# Patient Record
Sex: Male | Born: 1940
Health system: Southern US, Community
[De-identification: ages and names within clinical notes are randomized; demographics above are authoritative.]

## PROBLEM LIST (undated history)

## (undated) DIAGNOSIS — M1990 Unspecified osteoarthritis, unspecified site: Secondary | ICD-10-CM

## (undated) DIAGNOSIS — K8020 Calculus of gallbladder without cholecystitis without obstruction: Secondary | ICD-10-CM

## (undated) DIAGNOSIS — Z95810 Presence of automatic (implantable) cardiac defibrillator: Secondary | ICD-10-CM

## (undated) DIAGNOSIS — D696 Thrombocytopenia, unspecified: Secondary | ICD-10-CM

## (undated) DIAGNOSIS — N200 Calculus of kidney: Secondary | ICD-10-CM

## (undated) DIAGNOSIS — I4891 Unspecified atrial fibrillation: Secondary | ICD-10-CM

## (undated) DIAGNOSIS — D510 Vitamin B12 deficiency anemia due to intrinsic factor deficiency: Secondary | ICD-10-CM

## (undated) DIAGNOSIS — M549 Dorsalgia, unspecified: Secondary | ICD-10-CM

## (undated) DIAGNOSIS — E785 Hyperlipidemia, unspecified: Secondary | ICD-10-CM

## (undated) DIAGNOSIS — G8929 Other chronic pain: Secondary | ICD-10-CM

## (undated) DIAGNOSIS — D500 Iron deficiency anemia secondary to blood loss (chronic): Principal | ICD-10-CM

## (undated) DIAGNOSIS — L0390 Cellulitis, unspecified: Secondary | ICD-10-CM

## (undated) DIAGNOSIS — K909 Intestinal malabsorption, unspecified: Secondary | ICD-10-CM

## (undated) DIAGNOSIS — Z860100 Personal history of colon polyps, unspecified: Secondary | ICD-10-CM

## (undated) DIAGNOSIS — M4800 Spinal stenosis, site unspecified: Secondary | ICD-10-CM

## (undated) DIAGNOSIS — T4145XA Adverse effect of unspecified anesthetic, initial encounter: Secondary | ICD-10-CM

## (undated) DIAGNOSIS — I219 Acute myocardial infarction, unspecified: Secondary | ICD-10-CM

## (undated) DIAGNOSIS — C220 Liver cell carcinoma: Secondary | ICD-10-CM

## (undated) DIAGNOSIS — I10 Essential (primary) hypertension: Secondary | ICD-10-CM

## (undated) DIAGNOSIS — E1122 Type 2 diabetes mellitus with diabetic chronic kidney disease: Secondary | ICD-10-CM

## (undated) DIAGNOSIS — Z96659 Presence of unspecified artificial knee joint: Secondary | ICD-10-CM

## (undated) DIAGNOSIS — I255 Ischemic cardiomyopathy: Secondary | ICD-10-CM

## (undated) DIAGNOSIS — N183 Chronic kidney disease, stage 3 (moderate): Secondary | ICD-10-CM

## (undated) DIAGNOSIS — T8859XA Other complications of anesthesia, initial encounter: Secondary | ICD-10-CM

## (undated) DIAGNOSIS — G4733 Obstructive sleep apnea (adult) (pediatric): Secondary | ICD-10-CM

## (undated) DIAGNOSIS — G629 Polyneuropathy, unspecified: Secondary | ICD-10-CM

## (undated) HISTORY — DX: Intestinal malabsorption, unspecified: K90.9

## (undated) HISTORY — DX: Presence of unspecified artificial knee joint: Z96.659

## (undated) HISTORY — PX: LITHOTRIPSY: SUR834

## (undated) HISTORY — PX: OTHER SURGICAL HISTORY: SHX169

## (undated) HISTORY — DX: Unspecified atrial fibrillation: I48.91

## (undated) HISTORY — PX: TONSILLECTOMY: SUR1361

## (undated) HISTORY — PX: COLONOSCOPY: SHX174

## (undated) HISTORY — DX: Obstructive sleep apnea (adult) (pediatric): G47.33

## (undated) HISTORY — DX: Presence of automatic (implantable) cardiac defibrillator: Z95.810

## (undated) HISTORY — DX: Calculus of kidney: N20.0

## (undated) HISTORY — DX: Calculus of gallbladder without cholecystitis without obstruction: K80.20

## (undated) HISTORY — DX: Ischemic cardiomyopathy: I25.5

## (undated) HISTORY — DX: Type 2 diabetes mellitus with diabetic chronic kidney disease: E11.22

## (undated) HISTORY — DX: Essential (primary) hypertension: I10

## (undated) HISTORY — DX: Hyperlipidemia, unspecified: E78.5

## (undated) HISTORY — PX: LAMINECTOMY: SHX219

## (undated) HISTORY — PX: TOTAL KNEE ARTHROPLASTY: SHX125

## (undated) HISTORY — DX: Spinal stenosis, site unspecified: M48.00

## (undated) HISTORY — DX: Iron deficiency anemia secondary to blood loss (chronic): D50.0

## (undated) HISTORY — DX: Vitamin B12 deficiency anemia due to intrinsic factor deficiency: D51.0

## (undated) HISTORY — DX: Thrombocytopenia, unspecified: D69.6

## (undated) HISTORY — PX: ESOPHAGOGASTRODUODENOSCOPY: SHX1529

## (undated) HISTORY — DX: Chronic kidney disease, stage 3 (moderate): N18.3

---

## 1995-09-21 DIAGNOSIS — I219 Acute myocardial infarction, unspecified: Secondary | ICD-10-CM

## 1995-09-21 HISTORY — DX: Acute myocardial infarction, unspecified: I21.9

## 1995-09-21 HISTORY — PX: OTHER SURGICAL HISTORY: SHX169

## 2013-10-17 DIAGNOSIS — I2589 Other forms of chronic ischemic heart disease: Secondary | ICD-10-CM | POA: Diagnosis not present

## 2013-11-07 ENCOUNTER — Ambulatory Visit (INDEPENDENT_AMBULATORY_CARE_PROVIDER_SITE_OTHER): Payer: Medicare Other | Admitting: Cardiology

## 2013-11-07 ENCOUNTER — Encounter: Payer: Self-pay | Admitting: Cardiology

## 2013-11-07 VITALS — BP 150/70 | HR 84 | Ht 73.0 in | Wt 334.1 lb

## 2013-11-07 DIAGNOSIS — E785 Hyperlipidemia, unspecified: Secondary | ICD-10-CM

## 2013-11-07 DIAGNOSIS — I2510 Atherosclerotic heart disease of native coronary artery without angina pectoris: Secondary | ICD-10-CM

## 2013-11-07 DIAGNOSIS — I2589 Other forms of chronic ischemic heart disease: Secondary | ICD-10-CM

## 2013-11-07 DIAGNOSIS — I255 Ischemic cardiomyopathy: Secondary | ICD-10-CM | POA: Insufficient documentation

## 2013-11-07 DIAGNOSIS — Z95810 Presence of automatic (implantable) cardiac defibrillator: Secondary | ICD-10-CM

## 2013-11-07 DIAGNOSIS — I4891 Unspecified atrial fibrillation: Secondary | ICD-10-CM

## 2013-11-07 DIAGNOSIS — I10 Essential (primary) hypertension: Secondary | ICD-10-CM

## 2013-11-07 HISTORY — DX: Presence of automatic (implantable) cardiac defibrillator: Z95.810

## 2013-11-07 HISTORY — DX: Hyperlipidemia, unspecified: E78.5

## 2013-11-07 MED ORDER — OLMESARTAN MEDOXOMIL 40 MG PO TABS: 80.0000 mg | ORAL_TABLET | Freq: Every day | ORAL | Status: DC

## 2013-11-07 NOTE — Assessment & Plan Note (Signed)
Continue statin. 

## 2013-11-07 NOTE — Assessment & Plan Note (Signed)
There is a question history of atrial fibrillation. The patient is very diligent concerning his medical history and records. He states this may have been an error previously. I will obtain records from his previous cardiologist in [place] concerning this issue. If not documented I would favor discontinuing his anticoagulation particularly in light of history of thrombocytopenia. If atrial fibrillation has been documented he would need long-term anticoagulation as he has multiple embolic risk factors. I will discontinue aspirin and continue xeralto for now until we obtain records. Check hemoglobin and renal function.

## 2013-11-07 NOTE — Patient Instructions (Signed)
Your physician recommends that you schedule a follow-up appointment in: [place] TO 44 MG = 2 -40 MG TABLETS ONCE DAILY   Your physician recommends that you return for lab work in: [place]- DO NOT EAT PRIOR TO LAB WORK  APPOINTMENT TO [place].

## 2013-11-07 NOTE — Assessment & Plan Note (Signed)
Continue present medications. 

## 2013-11-07 NOTE — Progress Notes (Signed)
HPI: 73 year old male to establish for coronary artery disease. Patient is status post coronary artery bypassing graft in 1997. Patient had a LIMA to the LAD, saphenous vein graft to the diagonal, saphenous vein graft to the circumflex and sequential saphenous vein graft to the PDA and posterior lateral. His procedure was performed at [facility]. He subsequently moved to [facility]. Echocardiogram in 2005 showed an ejection fraction of 38%, restrictive filling and trace tricuspid regurgitation. Last nuclear study in August of 2013 showed a prior anterior infarct. There was a prior inferior and lateral infarct with mild peri-infarct ischemia. Ejection fraction 35%. Cardiac catheterization in March 2014 revealed occlusion of the LAD with a 70% ostial diagonal. The circumflex and right coronary artery were occluded. The saphenous vein graft to the circumflex was occluded. Saphenous vein graft to the diagonal occluded. The graft to the PDA and posterior lateral was patent. The LIMA to the LAD was patent. There were left to left collaterals filling marginal branches and diagonal branch. Ejection fraction 30-35%. Patient treated medically. Patient had ICD placed at that time. Note patient states his diagnosis of atrial fibrillation may have been in error. He does not recall ever being told of this until a late office visit with his last cardiologist. Patient moved back to this area 3 months ago. Patient has some dyspnea on exertion but no orthopnea, PND, palpitations, syncope or chest pain. Occasional mild pedal edema.  Current Outpatient Prescriptions  Medication Sig Dispense Refill  . amoxicillin (AMOXIL) 500 MG capsule Take 500 mg by mouth. PRIOR TO DENTAL WORK      . carvedilol (COREG) 12.5 MG tablet Take 12.5 mg by mouth 2 (two) times daily with a meal.      . Cholecalciferol (HM VITAMIN D3) 4000 UNITS CAPS Take 1 capsule by mouth daily.      . clobetasol (TEMOVATE) 0.05 % external solution Apply  1 application topically as needed.      . clobetasol ointment (TEMOVATE) 4.13 % Apply 1 application topically as needed.      . Coenzyme Q10 200 MG TABS Take 1 tablet by mouth daily.      Marland Kitchen ezetimibe (ZETIA) 10 MG tablet Take 10 mg by mouth daily.      . fenofibrate (TRICOR) 48 MG tablet Take 48 mg by mouth daily.      . furosemide (LASIX) 40 MG tablet Take 80 mg by mouth 2 (two) times daily.      . Insulin Regular Human (HUMULIN R U-500, CONCENTRATED, [place]) Inject 18 Units into the skin. 18 U @ BREAKFAST AND 6 TO 8 U @ DINNER      . metFORMIN (GLUCOPHAGE) 500 MG tablet Take 1,000 mg by mouth 2 (two) times daily with a meal.      . montelukast (SINGULAIR) 10 MG tablet Take 10 mg by mouth at bedtime.      . Multiple Vitamins-Minerals (CENTRUM SILVER PO) Take 1 tablet by mouth daily.      Marland Kitchen NIACINAMIDE-ZN-CU-METHYLFOLATE PO Take 2,000 mcg by mouth daily.      . nitroGLYCERIN (NITROSTAT) 0.4 MG SL tablet Place 0.4 mg under the tongue every 5 (five) minutes as needed for chest pain.      Marland Kitchen olmesartan (BENICAR) 40 MG tablet Take 40 mg by mouth daily.      Marland Kitchen omega-3 acid ethyl esters (LOVAZA) 1 G capsule Take 2 g by mouth daily.      . potassium chloride (K-DUR) 10 MEQ tablet  Take 20 mEq by mouth 2 (two) times daily.      . pravastatin (PRAVACHOL) 80 MG tablet Take 80 mg by mouth daily.      . Probiotic Product (PROBIOTIC DAILY PO) Take 1 tablet by mouth daily.      . Rivaroxaban (XARELTO) 20 MG TABS tablet Take 20 mg by mouth daily with supper.      Marland Kitchen spironolactone (ALDACTONE) 25 MG tablet Take 25 mg by mouth daily. 1/2 TABLET Monday, WED, AND FRI       No current facility-administered medications for this visit.    Allergies  Allergen Reactions  . Contrast Media [Iodinated Diagnostic Agents]   . Iodine      Past Medical History  Diagnosis Date  . CAD (coronary artery disease)   . Atrial fibrillation     Question history  . Hyperlipidemia   . Obstructive sleep apnea   . Nephrolithiasis    . Diabetes mellitus   . Congestive heart failure   . Thrombocytopenia   . Cholelithiasis   . Spinal stenosis   . ICD (implantable cardioverter-defibrillator) battery depletion   . Hypertension   . Ischemic cardiomyopathy     Past Surgical History  Procedure Laterality Date  . Coronary artery bypass and graft  1997  . Tonsillectomy    . Right rotator cuff surgery    . Total knee arthroplasty Left   . Laminectomy      History   Social History  . Marital Status: Married    Spouse Name: N/A    Number of Children: N/A  . Years of Education: N/A   Occupational History  .      Retired from [employer]  . Smoking status: Never Smoker   . Smokeless tobacco: Not on file  . Alcohol Use: Yes     Comment: Occasional  . Drug Use: Not on file  . Sexual Activity: Not on file   Other Topics Concern  . Not on file   Social History Narrative  . No narrative on file    Family History  Problem Relation Age of Onset  . Heart disease      No family history    ROS: no fevers or chills, productive cough, hemoptysis, dysphasia, odynophagia, melena, hematochezia, dysuria, hematuria, rash, seizure activity, orthopnea, PND, pedal edema, claudication. Remaining systems are negative.  Physical Exam:   Blood pressure 150/70, pulse 84, height 6' 1"  (1.854 m), weight 334 lb 1.9 oz (151.556 kg).  General:  Well developed/obese in NAD Skin warm/dry Patient not depressed No peripheral clubbing Back-normal HEENT-normal/normal eyelids Neck supple/normal carotid upstroke bilaterally; no bruits; no JVD; no thyromegaly chest - CTA/ normal expansion, ICD left chest CV - RRR/normal S1 and S2; no murmurs, rubs or gallops;  PMI nondisplaced Abdomen -obese, NT/ND, no HSM, no mass, + bowel sounds, no bruit 2+ femoral pulses, no bruits Ext-trace edema, no chords, 2+ DP Neuro-grossly nonfocal  ECG sinus rhythm at a rate of 84. First degree AV block. Right bundle  branch block and inferior lateral infarct.

## 2013-11-07 NOTE — Assessment & Plan Note (Signed)
Patient has not been tolerant of other statins. Continue Pravachol. Check lipids, liver and CK.

## 2013-11-07 NOTE — Assessment & Plan Note (Signed)
Referred to electrophysiology for long-term followup. Patient has a Sport and exercise psychologist. [name] device.

## 2013-11-07 NOTE — Assessment & Plan Note (Signed)
Continue carvedilol. Increase Benicar to 80 mg daily. Check potassium and renal function in one week.

## 2013-11-14 ENCOUNTER — Other Ambulatory Visit: Payer: Self-pay | Admitting: Cardiology

## 2013-11-14 DIAGNOSIS — I2510 Atherosclerotic heart disease of native coronary artery without angina pectoris: Secondary | ICD-10-CM | POA: Diagnosis not present

## 2013-11-15 LAB — BASIC METABOLIC PANEL WITH GFR
BUN: 25 mg/dL — AB (ref 6–23)
CHLORIDE: 102 meq/L (ref 96–112)
CO2: 25 mEq/L (ref 19–32)
Calcium: 9.2 mg/dL (ref 8.4–10.5)
Creat: 1.2 mg/dL (ref 0.50–1.35)
GFR, Est African American: 69 mL/min
GFR, Est Non African American: 60 mL/min
GLUCOSE: 150 mg/dL — AB (ref 70–99)
POTASSIUM: 4 meq/L (ref 3.5–5.3)
Sodium: 141 mEq/L (ref 135–145)

## 2013-11-15 LAB — LIPID PANEL
CHOL/HDL RATIO: 4.1 ratio
Cholesterol: 118 mg/dL (ref 0–200)
HDL: 29 mg/dL — AB (ref >39)
LDL CALC: 57 mg/dL (ref 0–99)
Triglycerides: 162 mg/dL — ABNORMAL HIGH (ref <150)
VLDL: 32 mg/dL (ref 0–40)

## 2013-11-15 LAB — HEPATIC FUNCTION PANEL
ALBUMIN: 4.4 g/dL (ref 3.5–5.2)
ALT: 44 U/L (ref 0–53)
AST: 40 U/L — ABNORMAL HIGH (ref 0–37)
Alkaline Phosphatase: 51 U/L (ref 39–117)
BILIRUBIN TOTAL: 0.5 mg/dL (ref 0.2–1.2)
Bilirubin, Direct: 0.2 mg/dL (ref 0.0–0.3)
Indirect Bilirubin: 0.3 mg/dL (ref 0.2–1.2)
Total Protein: 6.5 g/dL (ref 6.0–8.3)

## 2013-11-15 LAB — CBC
HCT: 37.4 % — ABNORMAL LOW (ref 39.0–52.0)
HEMOGLOBIN: 12.3 g/dL — AB (ref 13.0–17.0)
MCH: 29.4 pg (ref 26.0–34.0)
MCHC: 32.9 g/dL (ref 30.0–36.0)
MCV: 89.5 fL (ref 78.0–100.0)
Platelets: 87 10*3/uL — ABNORMAL LOW (ref 150–400)
RBC: 4.18 MIL/uL — AB (ref 4.22–5.81)
RDW: 16.5 % — ABNORMAL HIGH (ref 11.5–15.5)
WBC: 5 10*3/uL (ref 4.0–10.5)

## 2013-11-15 LAB — CK: Total CK: 201 U/L (ref 7–232)

## 2013-11-20 ENCOUNTER — Encounter: Payer: Self-pay | Admitting: Internal Medicine

## 2013-11-20 ENCOUNTER — Ambulatory Visit (INDEPENDENT_AMBULATORY_CARE_PROVIDER_SITE_OTHER): Payer: Medicare Other | Admitting: Internal Medicine

## 2013-11-20 ENCOUNTER — Institutional Professional Consult (permissible substitution): Payer: Medicare Other | Admitting: Internal Medicine

## 2013-11-20 VITALS — BP 136/84 | HR 90 | Ht 73.0 in | Wt 333.0 lb

## 2013-11-20 DIAGNOSIS — I4891 Unspecified atrial fibrillation: Secondary | ICD-10-CM

## 2013-11-20 DIAGNOSIS — I2589 Other forms of chronic ischemic heart disease: Secondary | ICD-10-CM

## 2013-11-20 DIAGNOSIS — Z95810 Presence of automatic (implantable) cardiac defibrillator: Secondary | ICD-10-CM

## 2013-11-20 DIAGNOSIS — I255 Ischemic cardiomyopathy: Secondary | ICD-10-CM

## 2013-11-20 DIAGNOSIS — I10 Essential (primary) hypertension: Secondary | ICD-10-CM | POA: Diagnosis not present

## 2013-11-20 LAB — MDC_IDC_ENUM_SESS_TYPE_INCLINIC
Battery Remaining Longevity: 92.4 mo
Brady Statistic RV Percent Paced: 0.04 %
Date Time Interrogation Session: 20150303113458
HighPow Impedance: 73 Ohm
Implantable Pulse Generator Model: 2411
Lead Channel Impedance Value: 425 Ohm
Lead Channel Pacing Threshold Amplitude: 0.5 V
Lead Channel Pacing Threshold Amplitude: 0.5 V
Lead Channel Pacing Threshold Pulse Width: 0.5 ms
Lead Channel Sensing Intrinsic Amplitude: 12 mV
Lead Channel Sensing Intrinsic Amplitude: 2.6 mV
Lead Channel Setting Pacing Amplitude: 2 V
Lead Channel Setting Pacing Pulse Width: 0.5 ms
Lead Channel Setting Sensing Sensitivity: 0.5 mV
MDC IDC MSMT LEADCHNL RV IMPEDANCE VALUE: 412.5 Ohm
MDC IDC MSMT LEADCHNL RV PACING THRESHOLD PULSEWIDTH: 0.5 ms
MDC IDC PG SERIAL: 7067730
MDC IDC STAT BRADY RA PERCENT PACED: 0 %
Zone Setting Detection Interval: 270 ms
Zone Setting Detection Interval: 325 ms

## 2013-11-20 NOTE — Assessment & Plan Note (Signed)
His ICD is working normally. (St. [name] DDD) and will be rechecked in several months.

## 2013-11-20 NOTE — Patient Instructions (Signed)
Your physician wants you to follow-up in: 12 months with Dr Knox Saliva will receive a reminder letter in the mail two months in advance. If you don't receive a letter, please call our office to schedule the follow-up appointment.  Remote monitoring is used to monitor your Pacemaker or ICD from home. This monitoring reduces the number of office visits required to check your device to one time per year. It allows [place] to keep an eye on the functioning of your device to ensure it is working properly. You are scheduled for a device check from home on 02/21/14. You may send your transmission at any time that day. If you have a wireless device, the transmission will be sent automatically. After your physician reviews your transmission, you will receive a postcard with your next transmission date.

## 2013-11-20 NOTE — Assessment & Plan Note (Signed)
His blood pressure is slightly elevated. I carefully discussed the importance of weight loss.

## 2013-11-20 NOTE — Assessment & Plan Note (Signed)
He denies anginal symptoms. I have asked the patient to increase his physical activity and lose weight.

## 2013-11-20 NOTE — Progress Notes (Signed)
HPI Robert Gregory is referred today for evaluation and management of his ICD. He is a pleasant 73 yo man with morbide obesity, who recently moved from [place] back to [place]. He has a h/o CAD and underwent CABG. He has a h/o atrial fibrillation. He has lost approximately 30 lbs in the past several months. He has chronic systolic heart failure with an EF of 35%. He has DM.  Allergies  Allergen Reactions  . Contrast Media [Iodinated Diagnostic Agents]   . Iodine      Current Outpatient Prescriptions  Medication Sig Dispense Refill  . carvedilol (COREG) 12.5 MG tablet Take 25 mg by mouth 2 (two) times daily with a meal.       . Cholecalciferol (HM VITAMIN D3) 4000 UNITS CAPS Take 1 capsule by mouth daily.      . clobetasol ointment (TEMOVATE) 3.33 % Apply 1 application topically as needed.      . Coenzyme Q10 200 MG TABS Take 1 tablet by mouth daily.      Marland Kitchen ezetimibe (ZETIA) 10 MG tablet Take 10 mg by mouth daily.      . fenofibrate (TRICOR) 48 MG tablet Take 48 mg by mouth daily.      . furosemide (LASIX) 40 MG tablet Take 80 mg by mouth 2 (two) times daily.      . Insulin Regular Human (HUMULIN R U-500, CONCENTRATED, [place]) Inject 18 Units into the skin. 18 U @ BREAKFAST AND 6 TO 8 U @ DINNER      . metFORMIN (GLUCOPHAGE) 500 MG tablet Take 1,000 mg by mouth 2 (two) times daily with a meal.      . montelukast (SINGULAIR) 10 MG tablet Take 10 mg by mouth at bedtime.      . Multiple Vitamins-Minerals (CENTRUM SILVER PO) Take 1 tablet by mouth daily.      Marland Kitchen NIACINAMIDE-ZN-CU-METHYLFOLATE PO Take 2,000 mcg by mouth daily.      . nitroGLYCERIN (NITROSTAT) 0.4 MG SL tablet Place 0.4 mg under the tongue every 5 (five) minutes as needed for chest pain.      Marland Kitchen olmesartan (BENICAR) 40 MG tablet Take 2 tablets (80 mg total) by mouth daily.  180 tablet  3  . omega-3 acid ethyl esters (LOVAZA) 1 G capsule Take 2 g by mouth daily.      . potassium chloride (K-DUR) 10 MEQ tablet Take 20 mEq  by mouth 2 (two) times daily.      . pravastatin (PRAVACHOL) 80 MG tablet Take 80 mg by mouth daily.      . Probiotic Product (PROBIOTIC DAILY PO) Take 1 tablet by mouth daily.      . Rivaroxaban (XARELTO) 20 MG TABS tablet Take 20 mg by mouth daily with supper.      Marland Kitchen spironolactone (ALDACTONE) 25 MG tablet Take 25 mg by mouth daily. 1/2 TABLET Monday, WED, AND FRI      . amoxicillin (AMOXIL) 500 MG capsule Take 500 mg by mouth. PRIOR TO DENTAL WORK       No current facility-administered medications for this visit.     Past Medical History  Diagnosis Date  . CAD (coronary artery disease)   . Atrial fibrillation     Question history  . Hyperlipidemia   . Obstructive sleep apnea   . Nephrolithiasis   . Diabetes mellitus   . Congestive heart failure   . Thrombocytopenia   . Cholelithiasis   . Spinal stenosis   .  ICD (implantable cardioverter-defibrillator) battery depletion   . Hypertension   . Ischemic cardiomyopathy     ROS:   All systems reviewed and negative except as noted in the HPI.   Past Surgical History  Procedure Laterality Date  . Coronary artery bypass and graft  1997  . Tonsillectomy    . Right rotator cuff surgery    . Total knee arthroplasty Left   . Laminectomy       Family History  Problem Relation Age of Onset  . Heart disease      No family history     History   Social History  . Marital Status: Married    Spouse Name: N/A    Number of Children: N/A  . Years of Education: N/A   Occupational History  .      Retired from [employer]  . Smoking status: Never Smoker   . Smokeless tobacco: Not on file  . Alcohol Use: Yes     Comment: Occasional  . Drug Use: Not on file  . Sexual Activity: Not on file   Other Topics Concern  . Not on file   Social History Narrative  . No narrative on file     BP 136/84  Pulse 90  Ht 6' 1"  (1.854 m)  Wt 333 lb (151.048 kg)  BMI 43.94 kg/m2  Physical  Exam:  stable appearing 73 yo obese man, NAD HEENT: Unremarkable Neck:  No JVD, no thyromegally Back:  No CVA tenderness Lungs:  Clear with no wheezes HEART:  Regular rate rhythm, no murmurs, no rubs, no clicks Abd:  soft, positive bowel sounds, no organomegally, no rebound, no guarding Ext:  2 plus pulses, no edema, no cyanosis, no clubbing Skin:  No rashes no nodules Neuro:  CN II through XII intact, motor grossly intact   DEVICE  Normal device function.  See PaceArt for details.   Assess/Plan:

## 2013-11-22 DIAGNOSIS — H938X9 Other specified disorders of ear, unspecified ear: Secondary | ICD-10-CM | POA: Diagnosis not present

## 2013-11-22 DIAGNOSIS — D693 Immune thrombocytopenic purpura: Secondary | ICD-10-CM | POA: Diagnosis not present

## 2013-11-22 DIAGNOSIS — I509 Heart failure, unspecified: Secondary | ICD-10-CM | POA: Diagnosis not present

## 2013-11-22 DIAGNOSIS — E7800 Pure hypercholesterolemia, unspecified: Secondary | ICD-10-CM | POA: Diagnosis not present

## 2013-11-22 DIAGNOSIS — I10 Essential (primary) hypertension: Secondary | ICD-10-CM | POA: Diagnosis not present

## 2013-11-22 DIAGNOSIS — N182 Chronic kidney disease, stage 2 (mild): Secondary | ICD-10-CM | POA: Diagnosis not present

## 2013-11-22 DIAGNOSIS — E1129 Type 2 diabetes mellitus with other diabetic kidney complication: Secondary | ICD-10-CM | POA: Diagnosis not present

## 2013-11-22 DIAGNOSIS — Z23 Encounter for immunization: Secondary | ICD-10-CM | POA: Diagnosis not present

## 2013-11-28 DIAGNOSIS — H2510 Age-related nuclear cataract, unspecified eye: Secondary | ICD-10-CM | POA: Diagnosis not present

## 2013-11-28 DIAGNOSIS — H47239 Glaucomatous optic atrophy, unspecified eye: Secondary | ICD-10-CM | POA: Diagnosis not present

## 2013-11-28 DIAGNOSIS — E119 Type 2 diabetes mellitus without complications: Secondary | ICD-10-CM | POA: Diagnosis not present

## 2013-12-10 DIAGNOSIS — H2510 Age-related nuclear cataract, unspecified eye: Secondary | ICD-10-CM | POA: Diagnosis not present

## 2013-12-10 DIAGNOSIS — E119 Type 2 diabetes mellitus without complications: Secondary | ICD-10-CM | POA: Diagnosis not present

## 2013-12-10 DIAGNOSIS — H47239 Glaucomatous optic atrophy, unspecified eye: Secondary | ICD-10-CM | POA: Diagnosis not present

## 2013-12-20 DIAGNOSIS — M7750 Other enthesopathy of unspecified foot: Secondary | ICD-10-CM | POA: Diagnosis not present

## 2013-12-20 DIAGNOSIS — M19279 Secondary osteoarthritis, unspecified ankle and foot: Secondary | ICD-10-CM | POA: Diagnosis not present

## 2013-12-20 DIAGNOSIS — L84 Corns and callosities: Secondary | ICD-10-CM | POA: Diagnosis not present

## 2013-12-20 DIAGNOSIS — R609 Edema, unspecified: Secondary | ICD-10-CM | POA: Diagnosis not present

## 2013-12-20 DIAGNOSIS — M2040 Other hammer toe(s) (acquired), unspecified foot: Secondary | ICD-10-CM | POA: Diagnosis not present

## 2014-01-03 DIAGNOSIS — E1129 Type 2 diabetes mellitus with other diabetic kidney complication: Secondary | ICD-10-CM | POA: Diagnosis not present

## 2014-01-03 DIAGNOSIS — I4891 Unspecified atrial fibrillation: Secondary | ICD-10-CM | POA: Diagnosis not present

## 2014-01-03 DIAGNOSIS — E669 Obesity, unspecified: Secondary | ICD-10-CM | POA: Diagnosis not present

## 2014-01-03 DIAGNOSIS — N182 Chronic kidney disease, stage 2 (mild): Secondary | ICD-10-CM | POA: Diagnosis not present

## 2014-01-17 DIAGNOSIS — R350 Frequency of micturition: Secondary | ICD-10-CM | POA: Diagnosis not present

## 2014-01-17 DIAGNOSIS — E1129 Type 2 diabetes mellitus with other diabetic kidney complication: Secondary | ICD-10-CM | POA: Diagnosis not present

## 2014-01-17 DIAGNOSIS — N3000 Acute cystitis without hematuria: Secondary | ICD-10-CM | POA: Diagnosis not present

## 2014-01-24 DIAGNOSIS — N182 Chronic kidney disease, stage 2 (mild): Secondary | ICD-10-CM | POA: Diagnosis not present

## 2014-01-24 DIAGNOSIS — E7800 Pure hypercholesterolemia, unspecified: Secondary | ICD-10-CM | POA: Diagnosis not present

## 2014-01-24 DIAGNOSIS — E1129 Type 2 diabetes mellitus with other diabetic kidney complication: Secondary | ICD-10-CM | POA: Diagnosis not present

## 2014-01-24 DIAGNOSIS — I509 Heart failure, unspecified: Secondary | ICD-10-CM | POA: Diagnosis not present

## 2014-01-24 DIAGNOSIS — N3000 Acute cystitis without hematuria: Secondary | ICD-10-CM | POA: Diagnosis not present

## 2014-01-24 DIAGNOSIS — I10 Essential (primary) hypertension: Secondary | ICD-10-CM | POA: Diagnosis not present

## 2014-01-24 DIAGNOSIS — R5383 Other fatigue: Secondary | ICD-10-CM | POA: Diagnosis not present

## 2014-01-24 DIAGNOSIS — R5381 Other malaise: Secondary | ICD-10-CM | POA: Diagnosis not present

## 2014-01-24 DIAGNOSIS — E559 Vitamin D deficiency, unspecified: Secondary | ICD-10-CM | POA: Diagnosis not present

## 2014-01-30 ENCOUNTER — Encounter: Payer: Self-pay | Admitting: Cardiology

## 2014-01-30 ENCOUNTER — Ambulatory Visit (INDEPENDENT_AMBULATORY_CARE_PROVIDER_SITE_OTHER): Payer: Medicare Other | Admitting: Cardiology

## 2014-01-30 VITALS — BP 138/82 | HR 74 | Ht 73.0 in | Wt 328.1 lb

## 2014-01-30 DIAGNOSIS — E785 Hyperlipidemia, unspecified: Secondary | ICD-10-CM | POA: Diagnosis not present

## 2014-01-30 DIAGNOSIS — Z95810 Presence of automatic (implantable) cardiac defibrillator: Secondary | ICD-10-CM

## 2014-01-30 DIAGNOSIS — I2510 Atherosclerotic heart disease of native coronary artery without angina pectoris: Secondary | ICD-10-CM

## 2014-01-30 MED ORDER — FUROSEMIDE 40 MG PO TABS: 80.0000 mg | ORAL_TABLET | Freq: Two times a day (BID) | ORAL | Status: DC

## 2014-01-30 MED ORDER — EZETIMIBE 10 MG PO TABS: 10.0000 mg | ORAL_TABLET | Freq: Every day | ORAL | Status: DC

## 2014-01-30 MED ORDER — RIVAROXABAN 20 MG PO TABS: 20.0000 mg | ORAL_TABLET | Freq: Every day | ORAL | Status: DC

## 2014-01-30 MED ORDER — PRAVASTATIN SODIUM 80 MG PO TABS: 80.0000 mg | ORAL_TABLET | Freq: Every day | ORAL | Status: DC

## 2014-01-30 MED ORDER — SPIRONOLACTONE 25 MG PO TABS: ORAL_TABLET | ORAL | Status: DC

## 2014-01-30 MED ORDER — FENOFIBRATE 48 MG PO TABS: 48.0000 mg | ORAL_TABLET | Freq: Every day | ORAL | Status: DC

## 2014-01-30 MED ORDER — OMEGA-3-ACID ETHYL ESTERS 1 G PO CAPS: 2.0000 g | ORAL_CAPSULE | Freq: Two times a day (BID) | ORAL | Status: DC

## 2014-01-30 MED ORDER — CARVEDILOL 12.5 MG PO TABS: 12.5000 mg | ORAL_TABLET | Freq: Two times a day (BID) | ORAL | Status: DC

## 2014-01-30 NOTE — Assessment & Plan Note (Signed)
Continue present medications. 

## 2014-01-30 NOTE — Patient Instructions (Signed)
Your physician wants you to follow-up in: 6 MONTHS WITH DR CRENSHAW You will receive a reminder letter in the mail two months in advance. If you don't receive a letter, please call our office to schedule the follow-up appointment.  

## 2014-01-30 NOTE — Assessment & Plan Note (Signed)
Continue ARB and beta blocker. 

## 2014-01-30 NOTE — Assessment & Plan Note (Signed)
Management per electrophysiology. 

## 2014-01-30 NOTE — Assessment & Plan Note (Signed)
Apparently documented on previous device interrogation. Continue beta blocker and xarelto.

## 2014-01-30 NOTE — Assessment & Plan Note (Signed)
Not on aspirin given need for anticoagulation. Continue statin.

## 2014-01-30 NOTE — Progress Notes (Signed)
HPI: FU coronary artery disease. Patient is status post coronary artery bypassing graft in 1997. Patient had a LIMA to the LAD, saphenous vein graft to the diagonal, saphenous vein graft to the circumflex and sequential saphenous vein graft to the PDA and posterior lateral. His procedure was performed at [facility]. He subsequently moved to [facility]. Echocardiogram in 2005 showed an ejection fraction of 38%, restrictive filling and trace tricuspid regurgitation. Cardiac catheterization in March 2014 revealed occlusion of the LAD with a 70% ostial diagonal. The circumflex and right coronary artery were occluded. The saphenous vein graft to the circumflex was occluded. Saphenous vein graft to the diagonal occluded. The graft to the PDA and posterior lateral was patent. The LIMA to the LAD was patent. There were left to left collaterals filling marginal branches and diagonal branch. Ejection fraction 30-35%. Patient treated medically. Patient had ICD placed at that time. Also with h/o atrial fibrillation. Since last seen, the patient has dyspnea with more extreme activities but not with routine activities. It is relieved with rest. It is not associated with chest pain. There is no orthopnea, PND or pedal edema. There is no syncope or palpitations. There is no exertional chest pain.    Current Outpatient Prescriptions  Medication Sig Dispense Refill  . Canagliflozin-Metformin HCl (INVOKAMET) 50-1000 MG TABS Take 1 tablet by mouth 2 (two) times daily.      . carvedilol (COREG) 12.5 MG tablet Take 12.5 mg by mouth 2 (two) times daily with a meal.       . Cholecalciferol (VITAMIN D PO) Take 5,000 Units by mouth daily.      . clobetasol ointment (TEMOVATE) 9.56 % Apply 1 application topically as needed.      . Coenzyme Q10 200 MG TABS Take 1 tablet by mouth daily.      Marland Kitchen ezetimibe (ZETIA) 10 MG tablet Take 10 mg by mouth daily.      . fenofibrate (TRICOR) 48 MG tablet Take 48 mg by mouth daily.       . furosemide (LASIX) 40 MG tablet Take 80 mg by mouth 2 (two) times daily.      . Insulin Regular Human (HUMULIN R U-500, CONCENTRATED, [place]) Inject 18 Units into the skin. 18 U @ BREAKFAST AND 6 TO 8 U @ DINNER      . montelukast (SINGULAIR) 10 MG tablet Take 10 mg by mouth at bedtime.      . Multiple Vitamins-Minerals (CENTRUM SILVER PO) Take 1 tablet by mouth daily.      Marland Kitchen NIACINAMIDE-ZN-CU-METHYLFOLATE PO Take 2,000 mcg by mouth daily.      . nitroGLYCERIN (NITROSTAT) 0.4 MG SL tablet Place 0.4 mg under the tongue every 5 (five) minutes as needed for chest pain.      Marland Kitchen olmesartan (BENICAR) 40 MG tablet Take 2 tablets (80 mg total) by mouth daily.  180 tablet  3  . omega-3 acid ethyl esters (LOVAZA) 1 G capsule Take 2 g by mouth 2 (two) times daily.       . potassium chloride (K-DUR) 10 MEQ tablet Take 20 mEq by mouth 2 (two) times daily.      . pravastatin (PRAVACHOL) 80 MG tablet Take 80 mg by mouth daily.      . Probiotic Product (PROBIOTIC DAILY PO) Take 1 tablet by mouth daily.      . Rivaroxaban (XARELTO) 20 MG TABS tablet Take 20 mg by mouth daily with supper.      Marland Kitchen  spironolactone (ALDACTONE) 25 MG tablet 1/2 TABLET Monday, WED, AND FRI      . amoxicillin (AMOXIL) 500 MG capsule Take 500 mg by mouth. PRIOR TO DENTAL WORK       No current facility-administered medications for this visit.     Past Medical History  Diagnosis Date  . CAD (coronary artery disease)   . Atrial fibrillation     Question history  . Hyperlipidemia   . Obstructive sleep apnea   . Nephrolithiasis   . Diabetes mellitus   . Congestive heart failure   . Thrombocytopenia   . Cholelithiasis   . Spinal stenosis   . ICD (implantable cardioverter-defibrillator) battery depletion   . Hypertension   . Ischemic cardiomyopathy     Past Surgical History  Procedure Laterality Date  . Coronary artery bypass and graft  1997  . Tonsillectomy    . Right rotator cuff surgery    . Total knee arthroplasty  Left   . Laminectomy      History   Social History  . Marital Status: Married    Spouse Name: N/A    Number of Children: N/A  . Years of Education: N/A   Occupational History  .      Retired from [employer]  . Smoking status: Never Smoker   . Smokeless tobacco: Not on file  . Alcohol Use: Yes     Comment: Occasional  . Drug Use: Not on file  . Sexual Activity: Not on file   Other Topics Concern  . Not on file   Social History Narrative  . No narrative on file    ROS: no fevers or chills, productive cough, hemoptysis, dysphasia, odynophagia, melena, hematochezia, dysuria, hematuria, rash, seizure activity, orthopnea, PND, pedal edema, claudication. Remaining systems are negative.  Physical Exam: Well-developed obese in no acute distress.  Skin is warm and dry.  HEENT is normal.  Neck is supple.  Chest is clear to auscultation with normal expansion.  Cardiovascular exam is regular rate and rhythm.  Abdominal exam nontender or distended. No masses palpated. Extremities show no edema. neuro grossly intact  ECG Sinus rhythm, first degree AV block, right bundle branch block, prior inferior infarct.

## 2014-01-30 NOTE — Assessment & Plan Note (Signed)
Blood pressure controlled. Continue present medications. 

## 2014-02-21 ENCOUNTER — Ambulatory Visit (INDEPENDENT_AMBULATORY_CARE_PROVIDER_SITE_OTHER): Payer: Medicare Other | Admitting: *Deleted

## 2014-02-21 DIAGNOSIS — I2589 Other forms of chronic ischemic heart disease: Secondary | ICD-10-CM | POA: Diagnosis not present

## 2014-02-21 DIAGNOSIS — I4891 Unspecified atrial fibrillation: Secondary | ICD-10-CM | POA: Diagnosis not present

## 2014-02-21 DIAGNOSIS — I255 Ischemic cardiomyopathy: Secondary | ICD-10-CM

## 2014-02-21 LAB — MDC_IDC_ENUM_SESS_TYPE_REMOTE
Battery Remaining Longevity: 92 mo
Battery Remaining Percentage: 87 %
Battery Voltage: 3.01 V
Brady Statistic RV Percent Paced: 1 %
Date Time Interrogation Session: 20150604060020
HighPow Impedance: 71 Ohm
HighPow Impedance: 71 Ohm
Implantable Pulse Generator Serial Number: 7067730
Lead Channel Impedance Value: 410 Ohm
Lead Channel Impedance Value: 410 Ohm
Lead Channel Pacing Threshold Amplitude: 0.5 V
Lead Channel Sensing Intrinsic Amplitude: 12 mV
Lead Channel Sensing Intrinsic Amplitude: 2.6 mV
Lead Channel Setting Pacing Amplitude: 2 V
Lead Channel Setting Pacing Pulse Width: 0.5 ms
Lead Channel Setting Sensing Sensitivity: 0.5 mV
MDC IDC MSMT LEADCHNL RV PACING THRESHOLD PULSEWIDTH: 0.5 ms
MDC IDC PG MODEL: 2411
Zone Setting Detection Interval: 270 ms
Zone Setting Detection Interval: 325 ms

## 2014-02-22 ENCOUNTER — Encounter: Payer: Self-pay | Admitting: Internal Medicine

## 2014-02-22 NOTE — Progress Notes (Signed)
Remote ICD transmission.   

## 2014-03-01 ENCOUNTER — Encounter: Payer: Self-pay | Admitting: Cardiology

## 2014-03-05 ENCOUNTER — Encounter: Payer: Self-pay | Admitting: Internal Medicine

## 2014-03-25 DIAGNOSIS — Z79899 Other long term (current) drug therapy: Secondary | ICD-10-CM | POA: Diagnosis not present

## 2014-03-25 DIAGNOSIS — E7800 Pure hypercholesterolemia, unspecified: Secondary | ICD-10-CM | POA: Diagnosis not present

## 2014-03-25 DIAGNOSIS — E1129 Type 2 diabetes mellitus with other diabetic kidney complication: Secondary | ICD-10-CM | POA: Diagnosis not present

## 2014-03-25 DIAGNOSIS — N182 Chronic kidney disease, stage 2 (mild): Secondary | ICD-10-CM | POA: Diagnosis not present

## 2014-04-03 DIAGNOSIS — E7800 Pure hypercholesterolemia, unspecified: Secondary | ICD-10-CM | POA: Diagnosis not present

## 2014-04-03 DIAGNOSIS — I10 Essential (primary) hypertension: Secondary | ICD-10-CM | POA: Diagnosis not present

## 2014-04-03 DIAGNOSIS — D693 Immune thrombocytopenic purpura: Secondary | ICD-10-CM | POA: Diagnosis not present

## 2014-05-28 ENCOUNTER — Emergency Department (HOSPITAL_BASED_OUTPATIENT_CLINIC_OR_DEPARTMENT_OTHER): Payer: Medicare Other

## 2014-05-28 ENCOUNTER — Emergency Department (HOSPITAL_BASED_OUTPATIENT_CLINIC_OR_DEPARTMENT_OTHER)
Admission: EM | Admit: 2014-05-28 | Discharge: 2014-05-28 | Disposition: A | Discharge status: Home / Self Care | Payer: Medicare Other | Attending: Emergency Medicine | Admitting: Emergency Medicine

## 2014-05-28 ENCOUNTER — Encounter (HOSPITAL_BASED_OUTPATIENT_CLINIC_OR_DEPARTMENT_OTHER): Payer: Self-pay | Admitting: Emergency Medicine

## 2014-05-28 ENCOUNTER — Ambulatory Visit (INDEPENDENT_AMBULATORY_CARE_PROVIDER_SITE_OTHER): Payer: Medicare Other | Admitting: *Deleted

## 2014-05-28 DIAGNOSIS — Z79899 Other long term (current) drug therapy: Secondary | ICD-10-CM | POA: Diagnosis not present

## 2014-05-28 DIAGNOSIS — Z951 Presence of aortocoronary bypass graft: Secondary | ICD-10-CM | POA: Insufficient documentation

## 2014-05-28 DIAGNOSIS — G4733 Obstructive sleep apnea (adult) (pediatric): Secondary | ICD-10-CM | POA: Insufficient documentation

## 2014-05-28 DIAGNOSIS — Z87442 Personal history of urinary calculi: Secondary | ICD-10-CM | POA: Insufficient documentation

## 2014-05-28 DIAGNOSIS — I2589 Other forms of chronic ischemic heart disease: Secondary | ICD-10-CM | POA: Diagnosis not present

## 2014-05-28 DIAGNOSIS — Z95810 Presence of automatic (implantable) cardiac defibrillator: Secondary | ICD-10-CM | POA: Diagnosis not present

## 2014-05-28 DIAGNOSIS — M1711 Unilateral primary osteoarthritis, right knee: Secondary | ICD-10-CM

## 2014-05-28 DIAGNOSIS — Z792 Long term (current) use of antibiotics: Secondary | ICD-10-CM | POA: Diagnosis not present

## 2014-05-28 DIAGNOSIS — Z96659 Presence of unspecified artificial knee joint: Secondary | ICD-10-CM | POA: Insufficient documentation

## 2014-05-28 DIAGNOSIS — I10 Essential (primary) hypertension: Secondary | ICD-10-CM | POA: Insufficient documentation

## 2014-05-28 DIAGNOSIS — E785 Hyperlipidemia, unspecified: Secondary | ICD-10-CM | POA: Diagnosis not present

## 2014-05-28 DIAGNOSIS — IMO0002 Reserved for concepts with insufficient information to code with codable children: Secondary | ICD-10-CM | POA: Insufficient documentation

## 2014-05-28 DIAGNOSIS — Z8719 Personal history of other diseases of the digestive system: Secondary | ICD-10-CM | POA: Insufficient documentation

## 2014-05-28 DIAGNOSIS — I509 Heart failure, unspecified: Secondary | ICD-10-CM

## 2014-05-28 DIAGNOSIS — M25569 Pain in unspecified knee: Secondary | ICD-10-CM | POA: Insufficient documentation

## 2014-05-28 DIAGNOSIS — M1710 Unilateral primary osteoarthritis, unspecified knee: Secondary | ICD-10-CM | POA: Diagnosis not present

## 2014-05-28 DIAGNOSIS — I2510 Atherosclerotic heart disease of native coronary artery without angina pectoris: Secondary | ICD-10-CM | POA: Diagnosis not present

## 2014-05-28 DIAGNOSIS — Z7901 Long term (current) use of anticoagulants: Secondary | ICD-10-CM | POA: Insufficient documentation

## 2014-05-28 DIAGNOSIS — I255 Ischemic cardiomyopathy: Secondary | ICD-10-CM

## 2014-05-28 DIAGNOSIS — Z794 Long term (current) use of insulin: Secondary | ICD-10-CM | POA: Insufficient documentation

## 2014-05-28 DIAGNOSIS — I4891 Unspecified atrial fibrillation: Secondary | ICD-10-CM | POA: Insufficient documentation

## 2014-05-28 DIAGNOSIS — Z862 Personal history of diseases of the blood and blood-forming organs and certain disorders involving the immune mechanism: Secondary | ICD-10-CM | POA: Insufficient documentation

## 2014-05-28 DIAGNOSIS — E119 Type 2 diabetes mellitus without complications: Secondary | ICD-10-CM | POA: Insufficient documentation

## 2014-05-28 LAB — MDC_IDC_ENUM_SESS_TYPE_REMOTE
Battery Remaining Longevity: 90 mo
Battery Remaining Percentage: 84 %
Battery Voltage: 3.01 V
HIGH POWER IMPEDANCE MEASURED VALUE: 78 Ohm
HIGH POWER IMPEDANCE MEASURED VALUE: 78 Ohm
Lead Channel Impedance Value: 450 Ohm
Lead Channel Pacing Threshold Amplitude: 0.5 V
Lead Channel Sensing Intrinsic Amplitude: 3 mV
Lead Channel Setting Pacing Amplitude: 2 V
Lead Channel Setting Sensing Sensitivity: 0.5 mV
MDC IDC MSMT LEADCHNL RV IMPEDANCE VALUE: 430 Ohm
MDC IDC MSMT LEADCHNL RV PACING THRESHOLD PULSEWIDTH: 0.5 ms
MDC IDC MSMT LEADCHNL RV SENSING INTR AMPL: 12 mV
MDC IDC PG MODEL: 2411
MDC IDC PG SERIAL: 7067730
MDC IDC SESS DTM: 20150908060027
MDC IDC SET LEADCHNL RV PACING PULSEWIDTH: 0.5 ms
MDC IDC SET ZONE DETECTION INTERVAL: 270 ms
MDC IDC STAT BRADY RV PERCENT PACED: 1 %
Zone Setting Detection Interval: 325 ms

## 2014-05-28 MED ORDER — OXYCODONE-ACETAMINOPHEN 5-325 MG PO TABS: 1.0000 | ORAL_TABLET | ORAL | Status: DC | PRN

## 2014-05-28 NOTE — ED Notes (Signed)
Right knee pain x7 days without known injury. Hx of knee replacement on left 9 years ago.

## 2014-05-28 NOTE — Discharge Instructions (Signed)
Osteoarthritis Osteoarthritis is a disease that causes soreness and inflammation of a joint. It occurs when the cartilage at the affected joint wears down. Cartilage acts as a cushion, covering the ends of bones where they meet to form a joint. Osteoarthritis is the most common form of arthritis. It often occurs in older people. The joints affected most often by this condition include those in the:  Ends of the fingers.  Thumbs.  Neck.  Lower back.  Knees.  Hips. CAUSES  Over time, the cartilage that covers the ends of bones begins to wear away. This causes bone to rub on bone, producing pain and stiffness in the affected joints.  RISK FACTORS Certain factors can increase your chances of having osteoarthritis, including:  Older age.  Excessive body weight.  Overuse of joints.  Previous joint injury. SIGNS AND SYMPTOMS   Pain, swelling, and stiffness in the joint.  Over time, the joint may lose its normal shape.  Small deposits of bone (osteophytes) may grow on the edges of the joint.  Bits of bone or cartilage can break off and float inside the joint space. This may cause more pain and damage. DIAGNOSIS  Your health care provider will do a physical exam and ask about your symptoms. Various tests may be ordered, such as:  X-rays of the affected joint.  An MRI scan.  Blood tests to rule out other types of arthritis.  Joint fluid tests. This involves using a needle to draw fluid from the joint and examining the fluid under a microscope. TREATMENT  Goals of treatment are to control pain and improve joint function. Treatment plans may include:  A prescribed exercise program that allows for rest and joint relief.  A weight control plan.  Pain relief techniques, such as:  Properly applied heat and cold.  Electric pulses delivered to nerve endings under the skin (transcutaneous electrical nerve stimulation [TENS]).  Massage.  Certain nutritional  supplements.  Medicines to control pain, such as:  Acetaminophen.  Nonsteroidal anti-inflammatory drugs (NSAIDs), such as naproxen.  Narcotic or central-acting agents, such as tramadol.  Corticosteroids. These can be given orally or as an injection.  Surgery to reposition the bones and relieve pain (osteotomy) or to remove loose pieces of bone and cartilage. Joint replacement may be needed in advanced states of osteoarthritis. HOME CARE INSTRUCTIONS   Take medicines only as directed by your health care provider.  Maintain a healthy weight. Follow your health care provider's instructions for weight control. This may include dietary instructions.  Exercise as directed. Your health care provider can recommend specific types of exercise. These may include:  Strengthening exercises. These are done to strengthen the muscles that support joints affected by arthritis. They can be performed with weights or with exercise bands to add resistance.  Aerobic activities. These are exercises, such as brisk walking or low-impact aerobics, that get your heart pumping.  Range-of-motion activities. These keep your joints limber.  Balance and agility exercises. These help you maintain daily living skills.  Rest your affected joints as directed by your health care provider.  Keep all follow-up visits as directed by your health care provider. SEEK MEDICAL CARE IF:   Your skin turns red.  You develop a rash in addition to your joint pain.  You have worsening joint pain.  You have a fever along with joint or muscle aches. SEEK IMMEDIATE MEDICAL CARE IF:  You have a significant loss of weight or appetite.  You have night sweats. FOR MORE  [place] [employer] Arthritis and Musculoskeletal and Skin Diseases: www.niams.SouthExposed.es  [employer] on Aging: http://kim-miller.com/  American College of Rheumatology: www.rheumatology.org Document Released: 09/06/2005 Document Revised:  01/21/2014 Document Reviewed: 05/14/2013 [facility] Patient Information 2015 [place], [place]. This information is not intended to replace advice given to you by your health care provider. Make sure you discuss any questions you have with your health care provider.

## 2014-05-28 NOTE — Progress Notes (Signed)
Remote ICD transmission.   

## 2014-05-28 NOTE — ED Provider Notes (Signed)
CSN: [id number]     Arrival date & time 05/28/14  1505 History   First MD Initiated Contact with Patient 05/28/14 1527     Chief Complaint  Patient presents with  . Knee Pain     (Consider location/radiation/quality/duration/timing/severity/associated sxs/prior Treatment) Patient is a 73 y.o. male presenting with knee pain.  Knee Pain Location:  Knee Time since incident:  1 week Injury: no   Knee location:  R knee Pain details:    Quality:  Aching   Radiates to:  Does not radiate   Severity:  Severe   Onset quality:  Gradual   Duration:  1 week   Timing:  Constant   Progression:  Worsening Chronicity:  Recurrent Prior injury to area:  No Relieved by:  Nothing Worsened by:  Activity and bearing weight Associated symptoms: swelling   Associated symptoms: no back pain, no fever, no neck pain, no numbness and no stiffness     Past Medical History  Diagnosis Date  . CAD (coronary artery disease)   . Atrial fibrillation     Question history  . Hyperlipidemia   . Obstructive sleep apnea   . Nephrolithiasis   . Diabetes mellitus   . Congestive heart failure   . Thrombocytopenia   . Cholelithiasis   . Spinal stenosis   . ICD (implantable cardioverter-defibrillator) battery depletion   . Hypertension   . Ischemic cardiomyopathy    Past Surgical History  Procedure Laterality Date  . Coronary artery bypass and graft  1997  . Tonsillectomy    . Right rotator cuff surgery    . Total knee arthroplasty Left   . Laminectomy     Family History  Problem Relation Age of Onset  . Heart disease      No family history   History  Substance Use Topics  . Smoking status: Never Smoker   . Smokeless tobacco: Not on file  . Alcohol Use: Yes     Comment: Occasional    Review of Systems  Constitutional: Negative for fever and chills.  HENT: Negative for congestion, rhinorrhea and sore throat.   Eyes: Negative for photophobia and visual disturbance.  Respiratory: Negative for  cough and shortness of breath.   Cardiovascular: Negative for chest pain and leg swelling.  Gastrointestinal: Negative for nausea, vomiting, abdominal pain, diarrhea and constipation.  Endocrine: Negative for polydipsia and polyuria.  Genitourinary: Negative for dysuria and hematuria.  Musculoskeletal: Negative for arthralgias, back pain, neck pain and stiffness.  Skin: Negative for color change and rash.  Neurological: Negative for dizziness, syncope, light-headedness and headaches.  Hematological: Negative for adenopathy. Does not bruise/bleed easily.  All other systems reviewed and are negative.     Allergies  Contrast media and Iodine  Home Medications   Prior to Admission medications   Medication Sig Start Date End Date Taking? Authorizing Provider  amoxicillin (AMOXIL) 500 MG capsule Take 500 mg by mouth. PRIOR TO DENTAL WORK    Historical Provider, MD  Canagliflozin-Metformin HCl (INVOKAMET) 50-1000 MG TABS Take 1 tablet by mouth 2 (two) times daily.    Historical Provider, MD  carvedilol (COREG) 12.5 MG tablet Take 1 tablet (12.5 mg total) by mouth 2 (two) times daily with a meal. 01/30/14   Lelon Perla, MD  Cholecalciferol (VITAMIN D PO) Take 5,000 Units by mouth daily.    Historical Provider, MD  clobetasol ointment (TEMOVATE) 7.67 % Apply 1 application topically as needed.    Historical Provider, MD  Coenzyme Q10 200  MG TABS Take 1 tablet by mouth daily.    Historical Provider, MD  ezetimibe (ZETIA) 10 MG tablet Take 1 tablet (10 mg total) by mouth daily. 01/30/14   Lelon Perla, MD  fenofibrate (TRICOR) 48 MG tablet Take 1 tablet (48 mg total) by mouth daily. 01/30/14   Lelon Perla, MD  furosemide (LASIX) 40 MG tablet Take 2 tablets (80 mg total) by mouth 2 (two) times daily. 01/30/14   Lelon Perla, MD  Insulin Regular Human (HUMULIN R U-500, CONCENTRATED, [place]) Inject 18 Units into the skin. 18 U @ BREAKFAST AND 6 TO 8 U @ DINNER    Historical Provider, MD   montelukast (SINGULAIR) 10 MG tablet Take 10 mg by mouth at bedtime.    Historical Provider, MD  Multiple Vitamins-Minerals (CENTRUM SILVER PO) Take 1 tablet by mouth daily.    Historical Provider, MD  NIACINAMIDE-ZN-CU-METHYLFOLATE PO Take 2,000 mcg by mouth daily.    Historical Provider, MD  nitroGLYCERIN (NITROSTAT) 0.4 MG SL tablet Place 0.4 mg under the tongue every 5 (five) minutes as needed for chest pain.    Historical Provider, MD  olmesartan (BENICAR) 40 MG tablet Take 2 tablets (80 mg total) by mouth daily. 11/07/13   Lelon Perla, MD  omega-3 acid ethyl esters (LOVAZA) 1 G capsule Take 2 capsules (2 g total) by mouth 2 (two) times daily. 01/30/14   Lelon Perla, MD  oxyCODONE-acetaminophen (PERCOCET/ROXICET) 5-325 MG per tablet Take 1 tablet by mouth every 4 (four) hours as needed for severe pain. 05/28/14   Debby Freiberg, MD  potassium chloride (K-DUR) 10 MEQ tablet Take 20 mEq by mouth 2 (two) times daily.    Historical Provider, MD  pravastatin (PRAVACHOL) 80 MG tablet Take 1 tablet (80 mg total) by mouth daily. 01/30/14   Lelon Perla, MD  Probiotic Product (PROBIOTIC DAILY PO) Take 1 tablet by mouth daily.    Historical Provider, MD  rivaroxaban (XARELTO) 20 MG TABS tablet Take 1 tablet (20 mg total) by mouth daily with supper. 01/30/14   Lelon Perla, MD  spironolactone (ALDACTONE) 25 MG tablet 1/2 TABLET Monday, WED, AND FRI 01/30/14   Lelon Perla, MD   BP 149/67  Pulse 78  Temp(Src) 98.1 F (36.7 C) (Oral)  Resp 16  Ht 6' 1"  (1.854 m)  Wt 324 lb (146.965 kg)  BMI 42.76 kg/m2  SpO2 95% Physical Exam  Vitals reviewed. Constitutional: He is oriented to person, place, and time. He appears well-developed and well-nourished.  HENT:  Head: Normocephalic and atraumatic.  Eyes: Conjunctivae and EOM are normal.  Neck: Normal range of motion. Neck supple.  Cardiovascular: Normal rate, regular rhythm and normal heart sounds.   Pulmonary/Chest: Effort normal and  breath sounds normal. No respiratory distress.  Abdominal: He exhibits no distension. There is no tenderness. There is no rebound and no guarding.  Musculoskeletal: Normal range of motion.       Right knee: He exhibits swelling and effusion. He exhibits normal range of motion, no deformity, no LCL laxity, no bony tenderness and no MCL laxity. No tenderness found.  Neurological: He is alert and oriented to person, place, and time.  Skin: Skin is warm and dry.    ED Course  Procedures (including critical care time) Labs Review Labs Reviewed - No data to display  Imaging Review Dg Knee 1-2 Views Right  05/28/2014   CLINICAL DATA:  RIGHT knee pain.  No injury.  EXAM: RIGHT KNEE -  1-2 VIEW  COMPARISON:  None.  FINDINGS: Mild medial and lateral compartment osteoarthritis. The alignment of the knee is anatomic. Small knee effusion. Severe patellofemoral osteoarthritis is present with marginal osteophytes and subchondral cysts in the patella and trochlea. Vascular clips are present along medial RIGHT leg. Heterotopic bone is present along the superior lateral aspect of the patella.  IMPRESSION: Tricompartmental osteoarthritis, severe in the medial compartment.   Electronically Signed   By: Dereck Ligas M.D.   On: 05/28/2014 15:31     EKG Interpretation None      MDM   Final diagnoses:  Primary osteoarthritis of right knee    73 y.o. male  with pertinent PMH of CAD, DM presents with atraumatic R knee pain.  Pt began to notice symptoms after walking an increased amount over the weekend. He denies acute trauma. He has no history of similar symptoms. No systemic symptoms. On arrival vital signs and physical exam as above. No erythema, limited range of motion, or other concerning findings for septic arthritis or other emergent pathology. X-ray obtained and consistent with severe osteoarthritis.  Patient given standard return precautions, voiced understanding, agreed to followup.  Labs and imaging  as above reviewed.   1. Primary osteoarthritis of right knee         Debby Freiberg, MD 05/28/14 1620

## 2014-05-30 DIAGNOSIS — L570 Actinic keratosis: Secondary | ICD-10-CM | POA: Diagnosis not present

## 2014-05-30 DIAGNOSIS — D1800 Hemangioma unspecified site: Secondary | ICD-10-CM | POA: Diagnosis not present

## 2014-05-30 DIAGNOSIS — L821 Other seborrheic keratosis: Secondary | ICD-10-CM | POA: Diagnosis not present

## 2014-05-30 DIAGNOSIS — L259 Unspecified contact dermatitis, unspecified cause: Secondary | ICD-10-CM | POA: Diagnosis not present

## 2014-05-30 DIAGNOSIS — L408 Other psoriasis: Secondary | ICD-10-CM | POA: Diagnosis not present

## 2014-05-30 DIAGNOSIS — L80 Vitiligo: Secondary | ICD-10-CM | POA: Diagnosis not present

## 2014-06-03 DIAGNOSIS — IMO0002 Reserved for concepts with insufficient information to code with codable children: Secondary | ICD-10-CM | POA: Diagnosis not present

## 2014-06-03 DIAGNOSIS — M25569 Pain in unspecified knee: Secondary | ICD-10-CM | POA: Diagnosis not present

## 2014-06-05 ENCOUNTER — Other Ambulatory Visit: Payer: Self-pay | Admitting: Orthopedic Surgery

## 2014-06-05 DIAGNOSIS — M545 Low back pain, unspecified: Secondary | ICD-10-CM | POA: Diagnosis not present

## 2014-06-05 DIAGNOSIS — M5416 Radiculopathy, lumbar region: Secondary | ICD-10-CM

## 2014-06-05 DIAGNOSIS — M25559 Pain in unspecified hip: Secondary | ICD-10-CM | POA: Diagnosis not present

## 2014-06-09 ENCOUNTER — Encounter: Payer: Self-pay | Admitting: Internal Medicine

## 2014-06-10 ENCOUNTER — Ambulatory Visit
Admission: RE | Admit: 2014-06-10 | Discharge: 2014-06-10 | Disposition: A | Discharge status: Home / Self Care | Payer: Medicare Other | Source: Ambulatory Visit | Attending: Orthopedic Surgery | Admitting: Orthopedic Surgery

## 2014-06-10 DIAGNOSIS — M5416 Radiculopathy, lumbar region: Secondary | ICD-10-CM

## 2014-06-10 DIAGNOSIS — M47817 Spondylosis without myelopathy or radiculopathy, lumbosacral region: Secondary | ICD-10-CM | POA: Diagnosis not present

## 2014-06-10 DIAGNOSIS — M545 Low back pain, unspecified: Secondary | ICD-10-CM

## 2014-06-10 DIAGNOSIS — M48061 Spinal stenosis, lumbar region without neurogenic claudication: Secondary | ICD-10-CM | POA: Diagnosis not present

## 2014-06-10 DIAGNOSIS — M5137 Other intervertebral disc degeneration, lumbosacral region: Secondary | ICD-10-CM | POA: Diagnosis not present

## 2014-06-11 ENCOUNTER — Encounter: Payer: Self-pay | Admitting: Cardiology

## 2014-06-11 DIAGNOSIS — IMO0002 Reserved for concepts with insufficient information to code with codable children: Secondary | ICD-10-CM | POA: Diagnosis not present

## 2014-06-11 DIAGNOSIS — M47817 Spondylosis without myelopathy or radiculopathy, lumbosacral region: Secondary | ICD-10-CM | POA: Diagnosis not present

## 2014-06-11 DIAGNOSIS — M545 Low back pain, unspecified: Secondary | ICD-10-CM | POA: Diagnosis not present

## 2014-06-12 ENCOUNTER — Encounter: Payer: Self-pay | Admitting: Internal Medicine

## 2014-06-18 ENCOUNTER — Ambulatory Visit: Payer: Medicare Other | Attending: Physical Medicine and Rehabilitation | Admitting: Physical Therapy

## 2014-06-18 DIAGNOSIS — M545 Low back pain, unspecified: Secondary | ICD-10-CM | POA: Insufficient documentation

## 2014-06-18 DIAGNOSIS — M25569 Pain in unspecified knee: Secondary | ICD-10-CM | POA: Diagnosis not present

## 2014-06-18 DIAGNOSIS — IMO0001 Reserved for inherently not codable concepts without codable children: Secondary | ICD-10-CM | POA: Insufficient documentation

## 2014-06-20 DIAGNOSIS — S83241D Other tear of medial meniscus, current injury, right knee, subsequent encounter: Secondary | ICD-10-CM | POA: Diagnosis not present

## 2014-06-20 DIAGNOSIS — M7121 Synovial cyst of popliteal space [Baker], right knee: Secondary | ICD-10-CM | POA: Diagnosis not present

## 2014-06-23 ENCOUNTER — Encounter: Payer: Self-pay | Admitting: Internal Medicine

## 2014-06-24 ENCOUNTER — Other Ambulatory Visit: Payer: Self-pay | Admitting: Orthopedic Surgery

## 2014-06-24 DIAGNOSIS — M7121 Synovial cyst of popliteal space [Baker], right knee: Secondary | ICD-10-CM

## 2014-06-25 ENCOUNTER — Ambulatory Visit: Payer: Medicare Other | Attending: Physical Medicine and Rehabilitation | Admitting: Physical Therapy

## 2014-06-25 DIAGNOSIS — M25561 Pain in right knee: Secondary | ICD-10-CM | POA: Insufficient documentation

## 2014-06-25 DIAGNOSIS — M545 Low back pain: Secondary | ICD-10-CM | POA: Insufficient documentation

## 2014-06-25 DIAGNOSIS — Z5189 Encounter for other specified aftercare: Secondary | ICD-10-CM | POA: Diagnosis not present

## 2014-06-26 ENCOUNTER — Ambulatory Visit: Payer: Medicare Other | Admitting: Physical Therapy

## 2014-06-26 ENCOUNTER — Ambulatory Visit
Admission: RE | Admit: 2014-06-26 | Discharge: 2014-06-26 | Disposition: A | Discharge status: Home / Self Care | Payer: Medicare Other | Source: Ambulatory Visit | Attending: Orthopedic Surgery | Admitting: Orthopedic Surgery

## 2014-06-26 DIAGNOSIS — M7121 Synovial cyst of popliteal space [Baker], right knee: Secondary | ICD-10-CM

## 2014-06-27 ENCOUNTER — Other Ambulatory Visit: Payer: Medicare Other

## 2014-07-01 ENCOUNTER — Ambulatory Visit: Payer: Medicare Other | Admitting: Physical Therapy

## 2014-07-01 DIAGNOSIS — Z5189 Encounter for other specified aftercare: Secondary | ICD-10-CM | POA: Diagnosis not present

## 2014-07-03 ENCOUNTER — Ambulatory Visit: Payer: Medicare Other | Admitting: Physical Therapy

## 2014-07-03 DIAGNOSIS — Z5189 Encounter for other specified aftercare: Secondary | ICD-10-CM | POA: Diagnosis not present

## 2014-07-05 ENCOUNTER — Other Ambulatory Visit: Payer: Self-pay

## 2014-07-08 ENCOUNTER — Ambulatory Visit: Payer: Medicare Other | Admitting: Physical Therapy

## 2014-07-09 ENCOUNTER — Ambulatory Visit: Payer: Medicare Other

## 2014-07-11 ENCOUNTER — Ambulatory Visit: Payer: Medicare Other

## 2014-07-11 DIAGNOSIS — Z5189 Encounter for other specified aftercare: Secondary | ICD-10-CM | POA: Diagnosis not present

## 2014-07-11 DIAGNOSIS — E119 Type 2 diabetes mellitus without complications: Secondary | ICD-10-CM | POA: Diagnosis not present

## 2014-07-11 DIAGNOSIS — M2040 Other hammer toe(s) (acquired), unspecified foot: Secondary | ICD-10-CM | POA: Diagnosis not present

## 2014-07-11 DIAGNOSIS — L84 Corns and callosities: Secondary | ICD-10-CM | POA: Diagnosis not present

## 2014-07-11 DIAGNOSIS — Z23 Encounter for immunization: Secondary | ICD-10-CM | POA: Diagnosis not present

## 2014-07-15 ENCOUNTER — Encounter: Payer: Self-pay | Admitting: Internal Medicine

## 2014-07-16 ENCOUNTER — Ambulatory Visit: Payer: Medicare Other

## 2014-07-16 DIAGNOSIS — Z5189 Encounter for other specified aftercare: Secondary | ICD-10-CM | POA: Diagnosis not present

## 2014-07-18 ENCOUNTER — Ambulatory Visit: Payer: Medicare Other

## 2014-07-18 DIAGNOSIS — Z5189 Encounter for other specified aftercare: Secondary | ICD-10-CM | POA: Diagnosis not present

## 2014-07-22 ENCOUNTER — Ambulatory Visit: Payer: Medicare Other

## 2014-07-23 ENCOUNTER — Encounter: Payer: Self-pay | Admitting: Internal Medicine

## 2014-07-25 ENCOUNTER — Ambulatory Visit: Payer: Medicare Other | Attending: Physical Medicine and Rehabilitation

## 2014-07-25 DIAGNOSIS — Z5189 Encounter for other specified aftercare: Secondary | ICD-10-CM | POA: Diagnosis not present

## 2014-07-25 DIAGNOSIS — M25561 Pain in right knee: Secondary | ICD-10-CM | POA: Insufficient documentation

## 2014-07-25 DIAGNOSIS — M545 Low back pain: Secondary | ICD-10-CM | POA: Diagnosis not present

## 2014-07-26 NOTE — Telephone Encounter (Signed)
Pt was alerted by his Merlin while sleeping. There have been no device alerts or episodes. Merlin is updated on active transmissions. We believe the culprit may have been power flickering or temporarily going out. This could have caused his Merlin to reactivate w/ noise once power was back on.

## 2014-07-26 NOTE — Telephone Encounter (Signed)
Left my direct # w/ spouse for pt to call me back.

## 2014-07-30 ENCOUNTER — Ambulatory Visit: Payer: Medicare Other

## 2014-07-30 NOTE — Progress Notes (Signed)
HPI: FU coronary artery disease. Patient is status post coronary artery bypassing graft in 1997. Patient had a LIMA to the LAD, saphenous vein graft to the diagonal, saphenous vein graft to the circumflex and sequential saphenous vein graft to the PDA and posterior lateral. His procedure was performed at [facility]. He subsequently moved to [facility]. Echocardiogram in 2005 showed an ejection fraction of 38%, restrictive filling and trace tricuspid regurgitation. Cardiac catheterization in March 2014 revealed occlusion of the LAD with a 70% ostial diagonal. The circumflex and right coronary artery were occluded. The saphenous vein graft to the circumflex was occluded. Saphenous vein graft to the diagonal occluded. The graft to the PDA and posterior lateral was patent. The LIMA to the LAD was patent. There were left to left collaterals filling marginal branches and diagonal branch. Ejection fraction 30-35%. Patient treated medically. Patient had ICD placed at that time. Also with h/o atrial fibrillation. Since last seen, the patient has dyspnea with more extreme activities but not with routine activities. It is relieved with rest. It is not associated with chest pain. There is no orthopnea, PND or pedal edema. There is no syncope or palpitations. There is no exertional chest pain.   Current Outpatient Prescriptions  Medication Sig Dispense Refill  . amoxicillin (AMOXIL) 500 MG capsule Take 500 mg by mouth. PRIOR TO DENTAL WORK    . Canagliflozin-Metformin HCl (INVOKAMET) 50-1000 MG TABS Take 1 tablet by mouth 2 (two) times daily.    . carvedilol (COREG) 12.5 MG tablet Take 1 tablet (12.5 mg total) by mouth 2 (two) times daily with a meal. 180 tablet 3  . Cholecalciferol (VITAMIN D PO) Take 5,000 Units by mouth daily.    . clobetasol ointment (TEMOVATE) 5.39 % Apply 1 application topically as needed.    . Coenzyme Q10 200 MG TABS Take 1 tablet by mouth daily.    Marland Kitchen ezetimibe (ZETIA) 10 MG  tablet Take 1 tablet (10 mg total) by mouth daily. 90 tablet 3  . fenofibrate (TRICOR) 48 MG tablet Take 1 tablet (48 mg total) by mouth daily. 90 tablet 3  . furosemide (LASIX) 40 MG tablet Take 2 tablets (80 mg total) by mouth 2 (two) times daily. 360 tablet 3  . Insulin Regular Human (HUMULIN R U-500, CONCENTRATED, [place]) Inject 18 Units into the skin. 18 U @ BREAKFAST AND 6 TO 8 U @ DINNER    . Methylfol-Algae-B12-Acetylcyst 6-90.314-2-600 MG TABS Take 2,000 mg by mouth.    . montelukast (SINGULAIR) 10 MG tablet Take 10 mg by mouth at bedtime.    . Multiple Vitamins-Minerals (CENTRUM SILVER PO) Take 1 tablet by mouth daily.    . nitroGLYCERIN (NITROSTAT) 0.4 MG SL tablet Place 0.4 mg under the tongue every 5 (five) minutes as needed for chest pain.    Marland Kitchen olmesartan (BENICAR) 40 MG tablet Take 2 tablets (80 mg total) by mouth daily. 180 tablet 3  . omega-3 acid ethyl esters (LOVAZA) 1 G capsule Take 2 capsules (2 g total) by mouth 2 (two) times daily. 360 capsule 3  . potassium chloride (K-DUR) 10 MEQ tablet Take 20 mEq by mouth 2 (two) times daily.    . pravastatin (PRAVACHOL) 80 MG tablet Take 1 tablet (80 mg total) by mouth daily. 90 tablet 3  . Probiotic Product (PROBIOTIC DAILY PO) Take 1 tablet by mouth daily.    . rivaroxaban (XARELTO) 20 MG TABS tablet Take 1 tablet (20 mg total) by mouth daily  with supper. 90 tablet 3  . spironolactone (ALDACTONE) 25 MG tablet 1/2 TABLET Monday, WED, AND FRI 90 tablet 3  . Cinnamon 500 MG TABS Take by mouth.    . Cranberry 200 MG CAPS Take 300 mg by mouth.     No current facility-administered medications for this visit.     Past Medical History  Diagnosis Date  . CAD (coronary artery disease)   . Atrial fibrillation     Question history  . Hyperlipidemia   . Obstructive sleep apnea   . Nephrolithiasis   . Diabetes mellitus   . Congestive heart failure   . Thrombocytopenia   . Cholelithiasis   . Spinal stenosis   . ICD (implantable  cardioverter-defibrillator) battery depletion   . Hypertension   . Ischemic cardiomyopathy     Past Surgical History  Procedure Laterality Date  . Coronary artery bypass and graft  1997  . Tonsillectomy    . Right rotator cuff surgery    . Total knee arthroplasty Left   . Laminectomy      History   Social History  . Marital Status: Married    Spouse Name: N/A    Number of Children: N/A  . Years of Education: N/A   Occupational History  .      Retired from [employer]  . Smoking status: Never Smoker   . Smokeless tobacco: Not on file  . Alcohol Use: Yes     Comment: Occasional  . Drug Use: No  . Sexual Activity: Not on file   Other Topics Concern  . Not on file   Social History Narrative    ROS: Back pain but no fevers or chills, productive cough, hemoptysis, dysphasia, odynophagia, melena, hematochezia, dysuria, hematuria, rash, seizure activity, orthopnea, PND, pedal edema, claudication. Remaining systems are negative.  Physical Exam: Well-developed obese in no acute distress.  Skin is warm and dry.  HEENT is normal.  Neck is supple.  Chest is clear to auscultation with normal expansion.  Cardiovascular exam is regular rate and rhythm.  Abdominal exam nontender or distended. No masses palpated. Extremities show trace edema. neuro grossly intact  ECG Sinus rhythm with first-degree AV block, right bundle branch block, prior inferior infarct.

## 2014-07-31 ENCOUNTER — Encounter: Payer: Self-pay | Admitting: Cardiology

## 2014-07-31 ENCOUNTER — Ambulatory Visit: Payer: Medicare Other

## 2014-07-31 ENCOUNTER — Ambulatory Visit (INDEPENDENT_AMBULATORY_CARE_PROVIDER_SITE_OTHER): Payer: Medicare Other | Admitting: Cardiology

## 2014-07-31 VITALS — BP 122/66 | HR 72 | Ht 73.0 in | Wt 331.0 lb

## 2014-07-31 DIAGNOSIS — I10 Essential (primary) hypertension: Secondary | ICD-10-CM | POA: Diagnosis not present

## 2014-07-31 DIAGNOSIS — M545 Low back pain: Secondary | ICD-10-CM | POA: Diagnosis not present

## 2014-07-31 DIAGNOSIS — I2510 Atherosclerotic heart disease of native coronary artery without angina pectoris: Secondary | ICD-10-CM | POA: Diagnosis not present

## 2014-07-31 DIAGNOSIS — I255 Ischemic cardiomyopathy: Secondary | ICD-10-CM | POA: Diagnosis not present

## 2014-07-31 DIAGNOSIS — M25561 Pain in right knee: Secondary | ICD-10-CM | POA: Diagnosis not present

## 2014-07-31 DIAGNOSIS — E785 Hyperlipidemia, unspecified: Secondary | ICD-10-CM

## 2014-07-31 DIAGNOSIS — I2589 Other forms of chronic ischemic heart disease: Secondary | ICD-10-CM | POA: Diagnosis not present

## 2014-07-31 DIAGNOSIS — I480 Paroxysmal atrial fibrillation: Secondary | ICD-10-CM

## 2014-07-31 DIAGNOSIS — I4891 Unspecified atrial fibrillation: Secondary | ICD-10-CM | POA: Diagnosis not present

## 2014-07-31 DIAGNOSIS — I2583 Coronary atherosclerosis due to lipid rich plaque: Secondary | ICD-10-CM | POA: Diagnosis not present

## 2014-07-31 DIAGNOSIS — Z5189 Encounter for other specified aftercare: Secondary | ICD-10-CM | POA: Diagnosis not present

## 2014-07-31 DIAGNOSIS — Z95810 Presence of automatic (implantable) cardiac defibrillator: Secondary | ICD-10-CM

## 2014-07-31 MED ORDER — FENOFIBRATE 48 MG PO TABS: 48.0000 mg | ORAL_TABLET | Freq: Every day | ORAL | Status: DC

## 2014-07-31 MED ORDER — FUROSEMIDE 40 MG PO TABS: 80.0000 mg | ORAL_TABLET | Freq: Two times a day (BID) | ORAL | Status: DC

## 2014-07-31 MED ORDER — OMEGA-3-ACID ETHYL ESTERS 1 G PO CAPS: 2.0000 g | ORAL_CAPSULE | Freq: Two times a day (BID) | ORAL | Status: DC

## 2014-07-31 MED ORDER — OLMESARTAN MEDOXOMIL 40 MG PO TABS: 80.0000 mg | ORAL_TABLET | Freq: Every day | ORAL | Status: DC

## 2014-07-31 MED ORDER — SPIRONOLACTONE 25 MG PO TABS: ORAL_TABLET | ORAL | Status: DC

## 2014-07-31 MED ORDER — RIVAROXABAN 20 MG PO TABS: 20.0000 mg | ORAL_TABLET | Freq: Every day | ORAL | Status: DC

## 2014-07-31 MED ORDER — EZETIMIBE 10 MG PO TABS: 10.0000 mg | ORAL_TABLET | Freq: Every day | ORAL | Status: DC

## 2014-07-31 MED ORDER — POTASSIUM CHLORIDE ER 10 MEQ PO TBCR: 20.0000 meq | EXTENDED_RELEASE_TABLET | Freq: Two times a day (BID) | ORAL | Status: DC

## 2014-07-31 MED ORDER — PRAVASTATIN SODIUM 80 MG PO TABS: 80.0000 mg | ORAL_TABLET | Freq: Every day | ORAL | Status: DC

## 2014-07-31 MED ORDER — CARVEDILOL 12.5 MG PO TABS: 12.5000 mg | ORAL_TABLET | Freq: Two times a day (BID) | ORAL | Status: DC

## 2014-07-31 NOTE — Assessment & Plan Note (Signed)
Continue statin. Check lipids and liver. 

## 2014-07-31 NOTE — Assessment & Plan Note (Addendum)
Continue beta blocker and ARB. Continue present dose of diuretics. Euvolemic on examination.

## 2014-07-31 NOTE — Patient Instructions (Signed)
Your physician wants you to follow-up in: 6 MONTHS WITH DR CRENSHAW You will receive a reminder letter in the mail two months in advance. If you don't receive a letter, please call our office to schedule the follow-up appointment.   Your physician recommends that you HAVE LAB WORK TODAY 

## 2014-07-31 NOTE — Assessment & Plan Note (Signed)
Blood pressure controlled. Continue present medications. Check potassium and renal function. 

## 2014-07-31 NOTE — Assessment & Plan Note (Signed)
Followed by electrophysiology. 

## 2014-07-31 NOTE — Assessment & Plan Note (Signed)
Continue statin.Not on aspirin and the need for anticoagulation.

## 2014-07-31 NOTE — Assessment & Plan Note (Signed)
Patient remains in sinus rhythm. Continue beta blocker and xarelto. Check hemoglobin and renal function.

## 2014-08-01 ENCOUNTER — Ambulatory Visit: Payer: Medicare Other

## 2014-08-01 DIAGNOSIS — D696 Thrombocytopenia, unspecified: Secondary | ICD-10-CM | POA: Diagnosis not present

## 2014-08-01 DIAGNOSIS — M47816 Spondylosis without myelopathy or radiculopathy, lumbar region: Secondary | ICD-10-CM | POA: Diagnosis not present

## 2014-08-01 DIAGNOSIS — I2510 Atherosclerotic heart disease of native coronary artery without angina pectoris: Secondary | ICD-10-CM | POA: Diagnosis not present

## 2014-08-01 DIAGNOSIS — E1165 Type 2 diabetes mellitus with hyperglycemia: Secondary | ICD-10-CM | POA: Diagnosis not present

## 2014-08-01 DIAGNOSIS — E559 Vitamin D deficiency, unspecified: Secondary | ICD-10-CM | POA: Diagnosis not present

## 2014-08-01 DIAGNOSIS — M545 Low back pain: Secondary | ICD-10-CM | POA: Diagnosis not present

## 2014-08-01 LAB — BASIC METABOLIC PANEL WITH GFR
BUN: 27 mg/dL — AB (ref 6–23)
CO2: 25 mEq/L (ref 19–32)
Calcium: 9.6 mg/dL (ref 8.4–10.5)
Chloride: 101 mEq/L (ref 96–112)
Creat: 1.26 mg/dL (ref 0.50–1.35)
GFR, EST AFRICAN AMERICAN: 65 mL/min
GFR, EST NON AFRICAN AMERICAN: 56 mL/min — AB
Glucose, Bld: 230 mg/dL — ABNORMAL HIGH (ref 70–99)
POTASSIUM: 3.9 meq/L (ref 3.5–5.3)
Sodium: 139 mEq/L (ref 135–145)

## 2014-08-01 LAB — LIPID PANEL
CHOLESTEROL: 123 mg/dL (ref 0–200)
HDL: 30 mg/dL — AB (ref >39)
LDL Cholesterol: 46 mg/dL (ref 0–99)
Total CHOL/HDL Ratio: 4.1 Ratio
Triglycerides: 235 mg/dL — ABNORMAL HIGH (ref <150)
VLDL: 47 mg/dL — ABNORMAL HIGH (ref 0–40)

## 2014-08-01 LAB — CBC
HEMATOCRIT: 39.7 % (ref 39.0–52.0)
Hemoglobin: 13.3 g/dL (ref 13.0–17.0)
MCH: 30 pg (ref 26.0–34.0)
MCHC: 33.5 g/dL (ref 30.0–36.0)
MCV: 89.4 fL (ref 78.0–100.0)
Platelets: 91 10*3/uL — ABNORMAL LOW (ref 150–400)
RBC: 4.44 MIL/uL (ref 4.22–5.81)
RDW: 16.2 % — AB (ref 11.5–15.5)
WBC: 5.8 10*3/uL (ref 4.0–10.5)

## 2014-08-01 LAB — HEPATIC FUNCTION PANEL
ALK PHOS: 47 U/L (ref 39–117)
ALT: 40 U/L (ref 0–53)
AST: 36 U/L (ref 0–37)
Albumin: 4.2 g/dL (ref 3.5–5.2)
BILIRUBIN DIRECT: 0.1 mg/dL (ref 0.0–0.3)
Indirect Bilirubin: 0.4 mg/dL (ref 0.2–1.2)
Total Bilirubin: 0.5 mg/dL (ref 0.2–1.2)
Total Protein: 6.7 g/dL (ref 6.0–8.3)

## 2014-08-20 ENCOUNTER — Ambulatory Visit
Admission: RE | Admit: 2014-08-20 | Discharge: 2014-08-20 | Disposition: A | Discharge status: Home / Self Care | Payer: Medicare Other | Source: Ambulatory Visit | Attending: Orthopedic Surgery | Admitting: Orthopedic Surgery

## 2014-08-20 ENCOUNTER — Other Ambulatory Visit: Payer: Self-pay | Admitting: Orthopedic Surgery

## 2014-08-20 DIAGNOSIS — M1711 Unilateral primary osteoarthritis, right knee: Secondary | ICD-10-CM | POA: Diagnosis not present

## 2014-08-20 DIAGNOSIS — M7121 Synovial cyst of popliteal space [Baker], right knee: Secondary | ICD-10-CM | POA: Diagnosis not present

## 2014-08-20 DIAGNOSIS — M25561 Pain in right knee: Secondary | ICD-10-CM

## 2014-08-20 DIAGNOSIS — M25461 Effusion, right knee: Secondary | ICD-10-CM | POA: Diagnosis not present

## 2014-08-22 DIAGNOSIS — S83241D Other tear of medial meniscus, current injury, right knee, subsequent encounter: Secondary | ICD-10-CM | POA: Diagnosis not present

## 2014-08-22 DIAGNOSIS — M47816 Spondylosis without myelopathy or radiculopathy, lumbar region: Secondary | ICD-10-CM | POA: Diagnosis not present

## 2014-08-25 ENCOUNTER — Encounter: Payer: Self-pay | Admitting: Cardiology

## 2014-08-27 ENCOUNTER — Telehealth: Payer: Self-pay | Admitting: Cardiology

## 2014-08-27 DIAGNOSIS — I480 Paroxysmal atrial fibrillation: Secondary | ICD-10-CM

## 2014-08-27 DIAGNOSIS — I2510 Atherosclerotic heart disease of native coronary artery without angina pectoris: Secondary | ICD-10-CM

## 2014-08-27 DIAGNOSIS — I2583 Coronary atherosclerosis due to lipid rich plaque: Secondary | ICD-10-CM

## 2014-08-27 DIAGNOSIS — I255 Ischemic cardiomyopathy: Secondary | ICD-10-CM

## 2014-08-27 DIAGNOSIS — I10 Essential (primary) hypertension: Secondary | ICD-10-CM

## 2014-08-27 DIAGNOSIS — E785 Hyperlipidemia, unspecified: Secondary | ICD-10-CM

## 2014-08-27 DIAGNOSIS — Z95810 Presence of automatic (implantable) cardiac defibrillator: Secondary | ICD-10-CM

## 2014-08-27 MED ORDER — FUROSEMIDE 40 MG PO TABS: 80.0000 mg | ORAL_TABLET | Freq: Two times a day (BID) | ORAL | Status: DC

## 2014-08-27 MED ORDER — SPIRONOLACTONE 25 MG PO TABS: 25.0000 mg | ORAL_TABLET | Freq: Every day | ORAL | Status: DC

## 2014-08-27 NOTE — Telephone Encounter (Signed)
Spoke with pt, he voiced and repeated medication changes. Lab will be drawn at the [place] location in one week. Patient voiced understanding of restrictions and the need to let me know if his symptoms do not improve.

## 2014-08-27 NOTE — Telephone Encounter (Signed)
Take lasix 120 BID for 3 days and then resume 80 BID; change spironolactone to 25 mg daily; bmet one week; low NA diet; fluid restrict to 1.5 liters daily; if does not improve, PAOV Kirk Ruths

## 2014-08-27 NOTE — Telephone Encounter (Signed)
Spoke with pt, he reports being up 15 lbs in the last 3 to 4 days. He has edema in his feet. bp 147/70. SOB off and on yesterday, not always related to exertion. Woke during the night Sunday night with trouble breathing, slept in the recliner last night. His furosemide is 80 mg bid and spironolactone 12.5 mg m,w and f. He will go ahead and take the 80 mg dose this am. Will forward for dr Stanford Breed review

## 2014-08-29 ENCOUNTER — Telehealth: Payer: Self-pay | Admitting: *Deleted

## 2014-08-29 ENCOUNTER — Encounter: Payer: Self-pay | Admitting: *Deleted

## 2014-08-29 ENCOUNTER — Encounter: Payer: Self-pay | Admitting: Internal Medicine

## 2014-08-29 ENCOUNTER — Ambulatory Visit (INDEPENDENT_AMBULATORY_CARE_PROVIDER_SITE_OTHER): Payer: Medicare Other | Admitting: *Deleted

## 2014-08-29 ENCOUNTER — Encounter: Payer: Self-pay | Admitting: Cardiology

## 2014-08-29 DIAGNOSIS — I255 Ischemic cardiomyopathy: Secondary | ICD-10-CM

## 2014-08-29 LAB — MDC_IDC_ENUM_SESS_TYPE_REMOTE
Battery Remaining Longevity: 88 mo
Battery Remaining Percentage: 83 %
Brady Statistic RV Percent Paced: 1 %
Date Time Interrogation Session: 20151210070018
HIGH POWER IMPEDANCE MEASURED VALUE: 79 Ohm
HighPow Impedance: 79 Ohm
Implantable Pulse Generator Model: 2411
Lead Channel Impedance Value: 430 Ohm
Lead Channel Setting Sensing Sensitivity: 0.5 mV
MDC IDC MSMT BATTERY VOLTAGE: 2.99 V
MDC IDC MSMT LEADCHNL RA IMPEDANCE VALUE: 430 Ohm
MDC IDC MSMT LEADCHNL RV SENSING INTR AMPL: 12 mV
MDC IDC PG SERIAL: 7067730
MDC IDC SET LEADCHNL RV PACING AMPLITUDE: 2 V
MDC IDC SET LEADCHNL RV PACING PULSEWIDTH: 0.5 ms
Zone Setting Detection Interval: 270 ms
Zone Setting Detection Interval: 325 ms

## 2014-08-29 NOTE — Telephone Encounter (Signed)
Clearance for right knee scope and clearance to hold xarelto 2 days prior to procedure faxed to the number provided.

## 2014-08-29 NOTE — Progress Notes (Signed)
Remote ICD transmission.   

## 2014-09-03 DIAGNOSIS — I255 Ischemic cardiomyopathy: Secondary | ICD-10-CM | POA: Diagnosis not present

## 2014-09-03 DIAGNOSIS — I480 Paroxysmal atrial fibrillation: Secondary | ICD-10-CM | POA: Diagnosis not present

## 2014-09-03 DIAGNOSIS — E785 Hyperlipidemia, unspecified: Secondary | ICD-10-CM | POA: Diagnosis not present

## 2014-09-03 DIAGNOSIS — Z95810 Presence of automatic (implantable) cardiac defibrillator: Secondary | ICD-10-CM | POA: Diagnosis not present

## 2014-09-03 DIAGNOSIS — I10 Essential (primary) hypertension: Secondary | ICD-10-CM | POA: Diagnosis not present

## 2014-09-03 DIAGNOSIS — I2510 Atherosclerotic heart disease of native coronary artery without angina pectoris: Secondary | ICD-10-CM | POA: Diagnosis not present

## 2014-09-03 LAB — BASIC METABOLIC PANEL WITH GFR
BUN: 30 mg/dL — ABNORMAL HIGH (ref 6–23)
CALCIUM: 9.7 mg/dL (ref 8.4–10.5)
CO2: 29 mEq/L (ref 19–32)
CREATININE: 1.48 mg/dL — AB (ref 0.50–1.35)
Chloride: 102 mEq/L (ref 96–112)
GFR, Est African American: 53 mL/min — ABNORMAL LOW
GFR, Est Non African American: 46 mL/min — ABNORMAL LOW
GLUCOSE: 186 mg/dL — AB (ref 70–99)
Potassium: 4.3 mEq/L (ref 3.5–5.3)
Sodium: 142 mEq/L (ref 135–145)

## 2014-09-04 ENCOUNTER — Other Ambulatory Visit: Payer: Self-pay | Admitting: *Deleted

## 2014-09-04 DIAGNOSIS — I255 Ischemic cardiomyopathy: Secondary | ICD-10-CM

## 2014-09-10 ENCOUNTER — Encounter: Payer: Self-pay | Admitting: Cardiology

## 2014-09-10 ENCOUNTER — Encounter: Payer: Self-pay | Admitting: Internal Medicine

## 2014-09-10 DIAGNOSIS — M47817 Spondylosis without myelopathy or radiculopathy, lumbosacral region: Secondary | ICD-10-CM | POA: Diagnosis not present

## 2014-09-10 DIAGNOSIS — M1711 Unilateral primary osteoarthritis, right knee: Secondary | ICD-10-CM | POA: Diagnosis not present

## 2014-09-11 ENCOUNTER — Telehealth: Payer: Self-pay | Admitting: Cardiology

## 2014-09-11 DIAGNOSIS — I255 Ischemic cardiomyopathy: Secondary | ICD-10-CM | POA: Diagnosis not present

## 2014-09-11 NOTE — Telephone Encounter (Signed)
Spoke with pt and informed him that we was still waiting for MD to sign off remote transmission from 12-10 and informed him that the transmission was normal. Scheduled pt March FU w/ MD for 3-2 at 11:45 AM pt agreed to this appt.

## 2014-09-11 NOTE — Telephone Encounter (Signed)
Follow up  Pt returning Debra's Phone call; please call back

## 2014-09-11 NOTE — Telephone Encounter (Signed)
New message        Pt has questions about his medication   please give pt a call

## 2014-09-11 NOTE — Telephone Encounter (Signed)
Left message for pt to call.

## 2014-09-11 NOTE — Telephone Encounter (Signed)
Spoke with pt, he feels he is getting dehydrated from his current furosemide dosage. He is currently taking 80 mg bid. Explained to patient he can decrease to 40 mg once daily or 40 mg bid. Explained to patient he can adjust his dosage based on his swelling and SOB. Patient voiced understanding, he will call with further questions.

## 2014-09-12 ENCOUNTER — Other Ambulatory Visit: Payer: Self-pay | Admitting: *Deleted

## 2014-09-12 ENCOUNTER — Telehealth: Payer: Self-pay | Admitting: Gynecology

## 2014-09-12 ENCOUNTER — Encounter: Payer: Self-pay | Admitting: Cardiology

## 2014-09-12 DIAGNOSIS — I255 Ischemic cardiomyopathy: Secondary | ICD-10-CM

## 2014-09-12 LAB — BASIC METABOLIC PANEL WITH GFR
BUN: 44 mg/dL — AB (ref 6–23)
CALCIUM: 9.6 mg/dL (ref 8.4–10.5)
CO2: 30 mEq/L (ref 19–32)
CREATININE: 1.78 mg/dL — AB (ref 0.50–1.35)
Chloride: 99 mEq/L (ref 96–112)
GFR, EST AFRICAN AMERICAN: 43 mL/min — AB
GFR, Est Non African American: 37 mL/min — ABNORMAL LOW
GLUCOSE: 174 mg/dL — AB (ref 70–99)
Potassium: 4.4 mEq/L (ref 3.5–5.3)
Sodium: 138 mEq/L (ref 135–145)

## 2014-09-12 NOTE — Telephone Encounter (Signed)
This encounter was created in error - please disregard.

## 2014-09-12 NOTE — Telephone Encounter (Signed)
New Msg            Pt returning call, states he is at home and may be reached there today.

## 2014-09-12 NOTE — Telephone Encounter (Signed)
Pt wanted [employer]

## 2014-09-18 ENCOUNTER — Encounter: Payer: Self-pay | Admitting: Cardiology

## 2014-09-18 DIAGNOSIS — I255 Ischemic cardiomyopathy: Secondary | ICD-10-CM | POA: Diagnosis not present

## 2014-09-18 LAB — BASIC METABOLIC PANEL WITH GFR
BUN: 25 mg/dL — AB (ref 6–23)
CALCIUM: 9.2 mg/dL (ref 8.4–10.5)
CO2: 26 mEq/L (ref 19–32)
Chloride: 102 mEq/L (ref 96–112)
Creat: 1.27 mg/dL (ref 0.50–1.35)
GFR, Est African American: 64 mL/min
GFR, Est Non African American: 56 mL/min — ABNORMAL LOW
Glucose, Bld: 159 mg/dL — ABNORMAL HIGH (ref 70–99)
Potassium: 4.2 mEq/L (ref 3.5–5.3)
Sodium: 137 mEq/L (ref 135–145)

## 2014-09-24 ENCOUNTER — Encounter: Payer: Self-pay | Admitting: Cardiology

## 2014-10-04 DIAGNOSIS — M47817 Spondylosis without myelopathy or radiculopathy, lumbosacral region: Secondary | ICD-10-CM | POA: Diagnosis not present

## 2014-10-04 DIAGNOSIS — M1711 Unilateral primary osteoarthritis, right knee: Secondary | ICD-10-CM | POA: Diagnosis not present

## 2014-10-18 ENCOUNTER — Telehealth: Payer: Self-pay | Admitting: Cardiology

## 2014-10-18 NOTE — Telephone Encounter (Signed)
Spoke with pt, per device clinic the patient will send a transmission and they will look at it and let me know what is going on.

## 2014-10-18 NOTE — Telephone Encounter (Signed)
Spoke with pt, for the last 2.5 weeks he has noticed an increase in irregular heart beats. He has a hx of PVC's but this feels different. He does not notice it as much when he is active and his heart rate is elevated. He limits his caffeine to two cups daily. Denies SOB or CP just has a little more fatigue than usual. Will discuss with the device clinic to see if a transmission will help [place] see what he is doing and call the patient back. Pt agreed with this plan.

## 2014-10-18 NOTE — Telephone Encounter (Signed)
Please call,pt is having some arrhythmias. He wants to talk about his medicine and see if he needs to come in.

## 2014-10-18 NOTE — Telephone Encounter (Signed)
Spoke with pt, per the device clinic, the patients transmission was fine. The patient does admit that his caffeine intake is up. He is going to try a no caffeine trial to see if that helps with the palpitations. He will call back next week if cont to occur.

## 2014-10-24 ENCOUNTER — Telehealth: Payer: Self-pay | Admitting: *Deleted

## 2014-10-24 NOTE — Telephone Encounter (Signed)
CLEARANCE FOR PATIENT TO HAVE RIGHT TOTAL KNEE ARTHOPLASTY AND PERMISSION FOR PATIENT TO HOLD [place] 3 DAYS PRIOR TO THE PROCEDURE AND RESUME POST OP WHEN HEMOSTASIS ACHIEVED AND OKAY WITH SURGERY FAXED.

## 2014-11-06 ENCOUNTER — Other Ambulatory Visit (HOSPITAL_COMMUNITY): Payer: Self-pay | Admitting: Orthopaedic Surgery

## 2014-11-19 ENCOUNTER — Encounter (HOSPITAL_COMMUNITY)
Admission: RE | Admit: 2014-11-19 | Discharge: 2014-11-19 | Disposition: A | Discharge status: Home / Self Care | Payer: Medicare Other | Source: Ambulatory Visit | Attending: Orthopaedic Surgery | Admitting: Orthopaedic Surgery

## 2014-11-19 ENCOUNTER — Encounter (HOSPITAL_COMMUNITY): Payer: Self-pay

## 2014-11-19 DIAGNOSIS — E119 Type 2 diabetes mellitus without complications: Secondary | ICD-10-CM | POA: Diagnosis not present

## 2014-11-19 DIAGNOSIS — E785 Hyperlipidemia, unspecified: Secondary | ICD-10-CM | POA: Diagnosis not present

## 2014-11-19 DIAGNOSIS — I4891 Unspecified atrial fibrillation: Secondary | ICD-10-CM | POA: Diagnosis not present

## 2014-11-19 DIAGNOSIS — Z0183 Encounter for blood typing: Secondary | ICD-10-CM | POA: Insufficient documentation

## 2014-11-19 DIAGNOSIS — Z951 Presence of aortocoronary bypass graft: Secondary | ICD-10-CM | POA: Insufficient documentation

## 2014-11-19 DIAGNOSIS — I2510 Atherosclerotic heart disease of native coronary artery without angina pectoris: Secondary | ICD-10-CM | POA: Diagnosis not present

## 2014-11-19 DIAGNOSIS — M179 Osteoarthritis of knee, unspecified: Secondary | ICD-10-CM | POA: Insufficient documentation

## 2014-11-19 DIAGNOSIS — Z01812 Encounter for preprocedural laboratory examination: Secondary | ICD-10-CM | POA: Diagnosis not present

## 2014-11-19 DIAGNOSIS — I5022 Chronic systolic (congestive) heart failure: Secondary | ICD-10-CM | POA: Insufficient documentation

## 2014-11-19 DIAGNOSIS — Z01818 Encounter for other preprocedural examination: Secondary | ICD-10-CM | POA: Diagnosis not present

## 2014-11-19 DIAGNOSIS — Z7902 Long term (current) use of antithrombotics/antiplatelets: Secondary | ICD-10-CM | POA: Insufficient documentation

## 2014-11-19 DIAGNOSIS — I252 Old myocardial infarction: Secondary | ICD-10-CM | POA: Diagnosis not present

## 2014-11-19 DIAGNOSIS — G4733 Obstructive sleep apnea (adult) (pediatric): Secondary | ICD-10-CM | POA: Diagnosis not present

## 2014-11-19 HISTORY — DX: Dorsalgia, unspecified: M54.9

## 2014-11-19 HISTORY — DX: Adverse effect of unspecified anesthetic, initial encounter: T41.45XA

## 2014-11-19 HISTORY — DX: Other chronic pain: G89.29

## 2014-11-19 HISTORY — DX: Other complications of anesthesia, initial encounter: T88.59XA

## 2014-11-19 HISTORY — DX: Acute myocardial infarction, unspecified: I21.9

## 2014-11-19 HISTORY — DX: Unspecified osteoarthritis, unspecified site: M19.90

## 2014-11-19 HISTORY — DX: Personal history of colon polyps, unspecified: Z86.0100

## 2014-11-19 HISTORY — DX: Polyneuropathy, unspecified: G62.9

## 2014-11-19 HISTORY — DX: Personal history of colonic polyps: Z86.010

## 2014-11-19 LAB — CBC WITH DIFFERENTIAL/PLATELET
BASOS ABS: 0 10*3/uL (ref 0.0–0.1)
Basophils Relative: 0 % (ref 0–1)
EOS PCT: 5 % (ref 0–5)
Eosinophils Absolute: 0.3 10*3/uL (ref 0.0–0.7)
HCT: 41.9 % (ref 39.0–52.0)
Hemoglobin: 13.9 g/dL (ref 13.0–17.0)
Lymphocytes Relative: 17 % (ref 12–46)
Lymphs Abs: 1 10*3/uL (ref 0.7–4.0)
MCH: 31 pg (ref 26.0–34.0)
MCHC: 33.2 g/dL (ref 30.0–36.0)
MCV: 93.3 fL (ref 78.0–100.0)
MONO ABS: 0.6 10*3/uL (ref 0.1–1.0)
Monocytes Relative: 10 % (ref 3–12)
Neutro Abs: 4 10*3/uL (ref 1.7–7.7)
Neutrophils Relative %: 69 % (ref 43–77)
Platelets: 96 10*3/uL — ABNORMAL LOW (ref 150–400)
RBC: 4.49 MIL/uL (ref 4.22–5.81)
RDW: 15.3 % (ref 11.5–15.5)
WBC: 5.8 10*3/uL (ref 4.0–10.5)

## 2014-11-19 LAB — URINALYSIS, ROUTINE W REFLEX MICROSCOPIC
BILIRUBIN URINE: NEGATIVE
Glucose, UA: >1000 mg/dL — AB
HGB URINE DIPSTICK: NEGATIVE
KETONES UR: NEGATIVE mg/dL
Leukocytes, UA: NEGATIVE
Nitrite: NEGATIVE
PH: 5.5 (ref 5.0–8.0)
Protein, ur: NEGATIVE mg/dL
SPECIFIC GRAVITY, URINE: 1.021 (ref 1.005–1.030)
UROBILINOGEN UA: 0.2 mg/dL (ref 0.0–1.0)

## 2014-11-19 LAB — COMPREHENSIVE METABOLIC PANEL
ALBUMIN: 4.1 g/dL (ref 3.5–5.2)
ALT: 60 U/L — AB (ref 0–53)
AST: 60 U/L — ABNORMAL HIGH (ref 0–37)
Alkaline Phosphatase: 57 U/L (ref 39–117)
Anion gap: 10 (ref 5–15)
BUN: 25 mg/dL — AB (ref 6–23)
CALCIUM: 10.1 mg/dL (ref 8.4–10.5)
CO2: 27 mmol/L (ref 19–32)
Chloride: 100 mmol/L (ref 96–112)
Creatinine, Ser: 1.39 mg/dL — ABNORMAL HIGH (ref 0.50–1.35)
GFR calc Af Amer: 56 mL/min — ABNORMAL LOW (ref >90)
GFR, EST NON AFRICAN AMERICAN: 49 mL/min — AB (ref >90)
GLUCOSE: 223 mg/dL — AB (ref 70–99)
Potassium: 4.5 mmol/L (ref 3.5–5.1)
SODIUM: 137 mmol/L (ref 135–145)
TOTAL PROTEIN: 7.1 g/dL (ref 6.0–8.3)
Total Bilirubin: 0.6 mg/dL (ref 0.3–1.2)

## 2014-11-19 LAB — PROTIME-INR
INR: 1.42 (ref 0.00–1.49)
Prothrombin Time: 17.5 seconds — ABNORMAL HIGH (ref 11.6–15.2)

## 2014-11-19 LAB — URINE MICROSCOPIC-ADD ON

## 2014-11-19 LAB — SEDIMENTATION RATE: SED RATE: 7 mm/h (ref 0–16)

## 2014-11-19 LAB — ABO/RH: ABO/RH(D): AB POS

## 2014-11-19 LAB — SURGICAL PCR SCREEN
MRSA, PCR: NEGATIVE
Staphylococcus aureus: NEGATIVE

## 2014-11-19 LAB — TYPE AND SCREEN
ABO/RH(D): AB POS
Antibody Screen: NEGATIVE

## 2014-11-19 LAB — APTT: APTT: 41 s — AB (ref 24–37)

## 2014-11-19 MED ORDER — CHLORHEXIDINE GLUCONATE 4 % EX LIQD: 60.0000 mL | Freq: Once | CUTANEOUS | Status: DC

## 2014-11-19 NOTE — Progress Notes (Signed)
Fasting blood sugar runs 130-140

## 2014-11-19 NOTE — Pre-Procedure Instructions (Signed)
BOBBI KOZAKIEWICZ  11/19/2014   Your procedure is scheduled on:  Fri, Mar 11 @12 :15 PM  Report to [facility]  at 10:15 AM.  Call this number if you have problems the morning of surgery: [phone]   Remember:   Do not eat food or drink liquids after midnight.   Take these medicines the morning of surgery with A SIP OF WATER: Carvedilol(Coreg)               Stop taking your Xarelto 3 days prior to surgery as instructed by Dr.Crenshaw. Also stop taking your Fish Oil and CO Q10. No Goody's,BC's,Aleve,Aspirin,or Ibuprofen.    Do not wear jewelry.  Do not wear lotions, powders, or colognes. You may wear deodorant.  Men may shave face and neck.  Do not bring valuables to the hospital.  [facility] is not responsible                  for any belongings or valuables.               Contacts, dentures or bridgework may not be worn into surgery.  Leave suitcase in the car. After surgery it may be brought to your room.  For patients admitted to the hospital, discharge time is determined by your                treatment team.                Special Instructions:  [place] - Preparing for Surgery  Before surgery, you can play an important role.  Because skin is not sterile, your skin needs to be as free of germs as possible.  You can reduce the number of germs on you skin by washing with CHG (chlorahexidine gluconate) soap before surgery.  CHG is an antiseptic cleaner which kills germs and bonds with the skin to continue killing germs even after washing.  Please DO NOT use if you have an allergy to CHG or antibacterial soaps.  If your skin becomes reddened/irritated stop using the CHG and inform your nurse when you arrive at Short Stay.  Do not shave (including legs and underarms) for at least 48 hours prior to the first CHG shower.  You may shave your face.  Please follow these instructions carefully:   1.  Shower with CHG Soap the night before surgery and the                                 morning of Surgery.  2.  If you choose to wash your hair, wash your hair first as usual with your       normal shampoo.  3.  After you shampoo, rinse your hair and body thoroughly to remove the                      Shampoo.  4.  Use CHG as you would any other liquid soap.  You can apply chg directly       to the skin and wash gently with scrungie or a clean washcloth.  5.  Apply the CHG Soap to your body ONLY FROM THE NECK DOWN.        Do not use on open wounds or open sores.  Avoid contact with your eyes,       ears, mouth and genitals (private parts).  Wash genitals (private parts)  with your normal soap.  6.  Wash thoroughly, paying special attention to the area where your surgery        will be performed.  7.  Thoroughly rinse your body with warm water from the neck down.  8.  DO NOT shower/wash with your normal soap after using and rinsing off       the CHG Soap.  9.  Pat yourself dry with a clean towel.            10.  Wear clean pajamas.            11.  Place clean sheets on your bed the night of your first shower and do not        sleep with pets.  Day of Surgery  Do not apply any lotions/deoderants the morning of surgery.  Please wear clean clothes to the hospital/surgery center.     Please read over the following fact sheets that you were given: Pain Booklet, Coughing and Deep Breathing, MRSA Information and Surgical Site Infection Prevention

## 2014-11-19 NOTE — Progress Notes (Signed)
Dr.Crenshaw is cardiologist-clearance note in chart  Echo report in epic from 2005  Stress test in epic from 2006/2009/2013  Heart cath prior to 1997  Dr.Abby Shamleffer is Medical MD  EKG in epic from 07-31-14  Denies CXR in past yr

## 2014-11-20 ENCOUNTER — Encounter: Payer: Self-pay | Admitting: Internal Medicine

## 2014-11-20 ENCOUNTER — Ambulatory Visit (INDEPENDENT_AMBULATORY_CARE_PROVIDER_SITE_OTHER): Payer: Medicare Other | Admitting: Internal Medicine

## 2014-11-20 VITALS — BP 126/60 | HR 74 | Ht 72.0 in | Wt 339.6 lb

## 2014-11-20 DIAGNOSIS — I10 Essential (primary) hypertension: Secondary | ICD-10-CM | POA: Diagnosis not present

## 2014-11-20 DIAGNOSIS — Z01810 Encounter for preprocedural cardiovascular examination: Secondary | ICD-10-CM | POA: Diagnosis not present

## 2014-11-20 DIAGNOSIS — Z95810 Presence of automatic (implantable) cardiac defibrillator: Secondary | ICD-10-CM

## 2014-11-20 DIAGNOSIS — I255 Ischemic cardiomyopathy: Secondary | ICD-10-CM | POA: Diagnosis not present

## 2014-11-20 DIAGNOSIS — I480 Paroxysmal atrial fibrillation: Secondary | ICD-10-CM

## 2014-11-20 LAB — MDC_IDC_ENUM_SESS_TYPE_INCLINIC
Battery Remaining Longevity: 84 mo
Brady Statistic RA Percent Paced: 0 %
Brady Statistic RV Percent Paced: 0.09 %
Date Time Interrogation Session: 20160302124631
HIGH POWER IMPEDANCE MEASURED VALUE: 75.375
Lead Channel Impedance Value: 400 Ohm
Lead Channel Impedance Value: 412.5 Ohm
Lead Channel Sensing Intrinsic Amplitude: 2.7 mV
MDC IDC MSMT LEADCHNL RV PACING THRESHOLD AMPLITUDE: 0.75 V
MDC IDC MSMT LEADCHNL RV PACING THRESHOLD PULSEWIDTH: 0.5 ms
MDC IDC MSMT LEADCHNL RV SENSING INTR AMPL: 12 mV
MDC IDC PG MODEL: 2411
MDC IDC PG SERIAL: 7067730
MDC IDC SET LEADCHNL RV PACING AMPLITUDE: 2 V
MDC IDC SET LEADCHNL RV PACING PULSEWIDTH: 0.5 ms
MDC IDC SET LEADCHNL RV SENSING SENSITIVITY: 0.5 mV
Zone Setting Detection Interval: 270 ms
Zone Setting Detection Interval: 325 ms

## 2014-11-20 LAB — C-REACTIVE PROTEIN: CRP: 0.6 mg/dL — AB (ref <0.60)

## 2014-11-20 NOTE — Assessment & Plan Note (Signed)
The patient is low risk for major cardiovascular complications from his proposed knee replacement surgery. Please do not hesitate to contact us should problems arise from his procedure.

## 2014-11-20 NOTE — Patient Instructions (Addendum)
  Your physician recommends that you continue on your current medications as directed. Please refer to the Current Medication list given to you today.  Your physician wants you to follow-up in: 1 year with [name]. You will receive a reminder letter in the mail two months in advance. If you don't receive a letter, please call our office to schedule the follow-up appointment.  Remote monitoring is used to monitor your Pacemaker or ICD from home. This monitoring reduces the number of office visits required to check your device to one time per year. It allows [place] to keep an eye on the functioning of your device to ensure it is working properly. You are scheduled for a device check from home on 02/18/2014. You may send your transmission at any time that day. If you have a wireless device, the transmission will be sent automatically. After your physician reviews your transmission, you will receive a postcard with your next transmission date.

## 2014-11-20 NOTE — Assessment & Plan Note (Signed)
His blood pressure is well controlled. He is encouraged to lose weight and maintain a low-sodium diet. No change in medical therapy.

## 2014-11-20 NOTE — Assessment & Plan Note (Signed)
His [name] ICD is working normally. We'll plan to recheck in several months.

## 2014-11-20 NOTE — Progress Notes (Signed)
HPI [name] is referred today for evaluation and management of his ICD. He is a pleasant 74 yo man with morbide obesity, who recently moved from [place] back to [place]. He has a h/o CAD and underwent CABG. He has a h/o atrial fibrillation. He has chronic systolic heart failure with an EF of 35%. He has DM. He has not been able to exercise, and is pending knee replacement surgery. His heart failure symptoms are class II, although he is quite sedentary. No ICD shocks. Allergies  Allergen Reactions  . Adhesive [Tape]     Rash   . Iodine   . Contrast Media [Iodinated Diagnostic Agents] Rash and Other (See Comments)    flushing EXTREME FLUSHING      Current Outpatient Prescriptions  Medication Sig Dispense Refill  . amoxicillin (AMOXIL) 500 MG capsule Take 500 mg by mouth. PRIOR TO DENTAL WORK    . Canagliflozin-Metformin HCl (INVOKAMET) 50-1000 MG TABS Take 1 tablet by mouth 2 (two) times daily.    . carvedilol (COREG) 12.5 MG tablet Take 1 tablet (12.5 mg total) by mouth 2 (two) times daily with a meal. 180 tablet 3  . Cholecalciferol (VITAMIN D PO) Take 5,000 Units by mouth daily.    . Cinnamon 500 MG TABS Take 500 mg by mouth daily.     . clobetasol ointment (TEMOVATE) 4.00 % Apply 1 application topically as needed.    . Coenzyme Q10 200 MG TABS Take 1 tablet by mouth daily.    . Cranberry 200 MG CAPS Take 300 mg by mouth daily.     Marland Kitchen ezetimibe (ZETIA) 10 MG tablet Take 1 tablet (10 mg total) by mouth daily. 90 tablet 3  . fenofibrate (TRICOR) 48 MG tablet Take 1 tablet (48 mg total) by mouth daily. 90 tablet 3  . furosemide (LASIX) 40 MG tablet Take 2 tablets (80 mg total) by mouth 2 (two) times daily. 360 tablet 3  . Insulin Regular Human (HUMULIN R U-500, CONCENTRATED, [place]) Inject 18 Units into the skin. 18 U @ BREAKFAST AND 6 TO 8 U @ DINNER    . Methylfol-Algae-B12-Acetylcyst 6-90.314-2-600 MG TABS Take 2,000 mg by mouth.    . montelukast (SINGULAIR) 10 MG  tablet Take 10 mg by mouth at bedtime.    . Multiple Vitamins-Minerals (CENTRUM SILVER PO) Take 1 tablet by mouth daily.    . nitroGLYCERIN (NITROSTAT) 0.4 MG SL tablet Place 0.4 mg under the tongue every 5 (five) minutes as needed for chest pain.    Marland Kitchen olmesartan (BENICAR) 40 MG tablet Take 2 tablets (80 mg total) by mouth daily. 180 tablet 3  . omega-3 acid ethyl esters (LOVAZA) 1 G capsule Take 2 capsules (2 g total) by mouth 2 (two) times daily. 360 capsule 3  . potassium chloride (K-DUR) 10 MEQ tablet Take 2 tablets (20 mEq total) by mouth 2 (two) times daily. 360 tablet 3  . pravastatin (PRAVACHOL) 80 MG tablet Take 1 tablet (80 mg total) by mouth daily. 90 tablet 3  . Probiotic Product (PROBIOTIC DAILY PO) Take 1 tablet by mouth daily.    . rivaroxaban (XARELTO) 20 MG TABS tablet Take 1 tablet (20 mg total) by mouth daily with supper. 90 tablet 3  . spironolactone (ALDACTONE) 25 MG tablet Take 1 tablet (25 mg total) by mouth daily. 90 tablet 3   No current facility-administered medications for this visit.     Past Medical History  Diagnosis Date  . CAD (  coronary artery disease)   . Atrial fibrillation     takes Xarelto daily  . Hyperlipidemia     takes San[employer]-SCI daily  . Obstructive sleep apnea   . Nephrolithiasis   . Thrombocytopenia   . Cholelithiasis   . Spinal stenosis   . ICD (implantable cardioverter-defibrillator) battery depletion   . Ischemic cardiomyopathy   . Congestive heart failure     takes Spironolactone daily  . Hypertension     takes Benicar and Coreg daily  . Myocardial infarction 1997    on the OR table   . Complication of anesthesia     forgetful for about 2-3wks after anesthesia  . Pneumonia     as a child  . Peripheral neuropathy   . Arthritis   . Joint pain   . Chronic back pain     scoliosis/arthritis  . History of colon polyps   . Urinary frequency   . Urinary urgency   . History of kidney stones   . Diabetes mellitus       takes Invokamet daily as well as Hum U    ROS:   All systems reviewed and negative except as noted in the HPI.   Past Surgical History  Procedure Laterality Date  . Coronary artery bypass and graft  1997    x 5  . Right rotator cuff surgery    . Total knee arthroplasty Left   . Laminectomy    . Tonsillectomy      adenoids  . Icd placed    . Colonoscopy    . Esophagogastroduodenoscopy    . Lithotripsy       Family History  Problem Relation Age of Onset  . Heart disease      No family history     History   Social History  . Marital Status: Married    Spouse Name: N/A  . Number of Children: N/A  . Years of Education: N/A   Occupational History  .      Retired from [employer]  . Smoking status: Never Smoker   . Smokeless tobacco: Not on file  . Alcohol Use: Yes     Comment: once drink a month  . Drug Use: No  . Sexual Activity: Not on file   Other Topics Concern  . Not on file   Social History Narrative     There were no vitals taken for this visit.  Physical Exam:  stable appearing 74 yo obese man, NAD HEENT: Unremarkable Neck:  No JVD, no thyromegally Back:  No CVA tenderness Lungs:  Clear with no wheezes, well-healed ICD incision. HEART:  Regular rate rhythm, no murmurs, no rubs, no clicks Abd:  soft, positive bowel sounds, no organomegally, no rebound, no guarding Ext:  2 plus pulses, no edema, no cyanosis, no clubbing Skin:  No rashes no nodules Neuro:  CN II through XII intact, motor grossly intact   DEVICE  Normal device function.  See PaceArt for details.   Assess/Plan:

## 2014-11-22 NOTE — Progress Notes (Signed)
Anesthesia Chart Review:  Patient is a 75 year old male scheduled for right TKA on 3/11/6 by Dr. Erlinda Hong.  History includes non-smoker, CAD/MI '97 s/p CABG (LIMA to LAD, SVG to CX, seq SVG to PDA and PL at [facility]), ischemic CM, chronic systolic CHF, s/p [employer]. [name] ICD, afib on Xarelto, HLD, OSA, DM2, morbid obesity, thrombocytopenia, left TKA. BMI is 46. PCP is Dr. Kelton Pillar who cleared patient from a medical standpoint.  Primary cardiologist is Dr. Stanford Breed who cleared patient from a CV standpoint with permission to hold Xarelto 3 days prior to surgery. EP cardiologist is [name] who also cleared him with "low risk for major cardiovascular complication from his proposed knee replacement surgery." ICD was working normally on 11/20/14. Patient recently moved from [place] to [place].   Patient reported forgetfulness for 2-3 weeks after anesthesia in the past.  ECG 07/31/14 Sinus rhythm with first-degree AV block, right bundle branch block, LAFB, bifascicular block, prior inferior infarct.  Cardiac catheterization on 12/07/12 revealed 20-30% LM, occlusion of the LAD with a 70% ostial diagonal. The circumflex and right coronary artery were occluded. The saphenous vein graft to the circumflex was occluded. Saphenous vein graft to the diagonal occluded. The graft to the PDA and posterior lateral was patent. The LIMA to the LAD was patent. There were left to left collaterals filling marginal branches and diagonal branch. Ejection fraction 30-35% with anteroapical hypokinesis. Patient treated medically.   No recent echo found.  Preoperative labs noted. PLT count 96K, which is consistent with labs since last year.  There are also labs under the Media tab from 12/09/11 that show a PLT count of 101K then. Cr 1.39, stable. Glucose 223. AST/ALT 60/60. PT 17.5, INR 1.42, PTT 41.  He is on Xarelto--to stop 3 days prior.  T&S ordered.  Although his PLT count appears stable, I will repeat this and his PT/PTT since  case is currently posted for spinal anesthesia and his Xarelto may be affecting results. Depending on these results, his anesthesiologist may not think he is a candidate for spinal anesthesia. Morbid obesity, prior back surgery (laminectomy--not fusion by records), and sleep apnea may also not make him an ideal candidate. Also in the past, our anesthesiologist have wanted the newer blood thinners to be stopped for five days prior to spinal anesthesia--currently he has been instructed for three days.  I have communicated this with Sherrie at Dr. Phoebe Sharps office.  She will discuss with Dr. Monica Martinez he or the patient has been adamant about spinal anesthesia then I asked her to let me know because I may need to have one of our anesthesiologist address options prior to his surgery--may also need to readdress anticoagulation instructions with his cardiologist.   Myra Gianotti, PA-C [facility] Short Stay Center/Anesthesiology Phone [phone] 11/22/2014 1:15 PM

## 2014-11-25 DIAGNOSIS — H526 Other disorders of refraction: Secondary | ICD-10-CM | POA: Diagnosis not present

## 2014-11-25 DIAGNOSIS — H2513 Age-related nuclear cataract, bilateral: Secondary | ICD-10-CM | POA: Diagnosis not present

## 2014-11-25 DIAGNOSIS — E119 Type 2 diabetes mellitus without complications: Secondary | ICD-10-CM | POA: Diagnosis not present

## 2014-11-25 DIAGNOSIS — H40003 Preglaucoma, unspecified, bilateral: Secondary | ICD-10-CM | POA: Diagnosis not present

## 2014-11-28 DIAGNOSIS — I2510 Atherosclerotic heart disease of native coronary artery without angina pectoris: Secondary | ICD-10-CM | POA: Diagnosis not present

## 2014-11-28 DIAGNOSIS — R7989 Other specified abnormal findings of blood chemistry: Secondary | ICD-10-CM | POA: Diagnosis not present

## 2014-11-28 DIAGNOSIS — E1122 Type 2 diabetes mellitus with diabetic chronic kidney disease: Secondary | ICD-10-CM | POA: Diagnosis not present

## 2014-11-28 DIAGNOSIS — Z0000 Encounter for general adult medical examination without abnormal findings: Secondary | ICD-10-CM | POA: Diagnosis not present

## 2014-11-28 DIAGNOSIS — E785 Hyperlipidemia, unspecified: Secondary | ICD-10-CM | POA: Diagnosis not present

## 2014-11-28 DIAGNOSIS — Z1389 Encounter for screening for other disorder: Secondary | ICD-10-CM | POA: Diagnosis not present

## 2014-11-28 DIAGNOSIS — Z125 Encounter for screening for malignant neoplasm of prostate: Secondary | ICD-10-CM | POA: Diagnosis not present

## 2014-11-28 DIAGNOSIS — D696 Thrombocytopenia, unspecified: Secondary | ICD-10-CM | POA: Diagnosis not present

## 2014-11-28 DIAGNOSIS — N183 Chronic kidney disease, stage 3 (moderate): Secondary | ICD-10-CM | POA: Diagnosis not present

## 2014-11-28 MED ORDER — DEXTROSE 5 % IV SOLN
3.0000 g | INTRAVENOUS | Status: AC
  Administered 2014-11-29: 3 g via INTRAVENOUS
  Filled 2014-11-28: qty 3000

## 2014-11-28 NOTE — H&P (Signed)
PREOPERATIVE H&P  Chief Complaint: Right knee degenerative joint disease  HPI: Robert Gregory is a 74 y.o. male who presents for surgical treatment of Right knee degenerative joint disease.  He denies any changes in medical history.  Past Medical History  Diagnosis Date  . CAD (coronary artery disease)   . Atrial fibrillation     takes Xarelto daily  . Hyperlipidemia     takes Sanmina-SCI daily  . Obstructive sleep apnea   . Nephrolithiasis   . Thrombocytopenia   . Cholelithiasis   . Spinal stenosis   . ICD (implantable cardioverter-defibrillator) battery depletion   . Ischemic cardiomyopathy   . Congestive heart failure     takes Spironolactone daily  . Hypertension     takes Benicar and Coreg daily  . Myocardial infarction 1997    on the OR table   . Complication of anesthesia     forgetful for about 2-3wks after anesthesia  . Pneumonia     as a child  . Peripheral neuropathy   . Arthritis   . Joint pain   . Chronic back pain     scoliosis/arthritis  . History of colon polyps   . Urinary frequency   . Urinary urgency   . History of kidney stones   . Diabetes mellitus     takes Invokamet daily as well as Hum U   Past Surgical History  Procedure Laterality Date  . Coronary artery bypass and graft  1997    x 5  . Right rotator cuff surgery    . Total knee arthroplasty Left   . Laminectomy    . Tonsillectomy      adenoids  . Icd placed    . Colonoscopy    . Esophagogastroduodenoscopy    . Lithotripsy     History   Social History  . Marital Status: Married    Spouse Name: N/A  . Number of Children: N/A  . Years of Education: N/A   Occupational History  .      Retired from [employer]  . Smoking status: Never Smoker   . Smokeless tobacco: Not on file  . Alcohol Use: Yes     Comment: once drink a month  . Drug Use: No  . Sexual Activity: Not on file   Other Topics Concern  . Not on file   Social  History Narrative   Family History  Problem Relation Age of Onset  . Heart disease      No family history   Allergies  Allergen Reactions  . Adhesive [Tape]     Rash   . Iodine   . Contrast Media [Iodinated Diagnostic Agents] Rash and Other (See Comments)    flushing EXTREME FLUSHING    Prior to Admission medications   Medication Sig Start Date End Date Taking? Authorizing Provider  amoxicillin (AMOXIL) 500 MG capsule Take 500 mg by mouth. PRIOR TO DENTAL WORK    Historical Provider, MD  Canagliflozin-Metformin HCl (INVOKAMET) 50-1000 MG TABS Take 1 tablet by mouth 2 (two) times daily.    Historical Provider, MD  carvedilol (COREG) 12.5 MG tablet Take 1 tablet (12.5 mg total) by mouth 2 (two) times daily with a meal. 07/31/14   Lelon Perla, MD  Cholecalciferol (VITAMIN D PO) Take 5,000 Units by mouth daily.    Historical Provider, MD  Cinnamon 500 MG TABS Take 500 mg by mouth daily.     Historical Provider,  MD  clobetasol ointment (TEMOVATE) 8.28 % Apply 1 application topically as needed.    Historical Provider, MD  Coenzyme Q10 200 MG TABS Take 1 tablet by mouth daily.    Historical Provider, MD  Cranberry 200 MG CAPS Take 300 mg by mouth daily.     Historical Provider, MD  ezetimibe (ZETIA) 10 MG tablet Take 1 tablet (10 mg total) by mouth daily. 07/31/14   Lelon Perla, MD  fenofibrate (TRICOR) 48 MG tablet Take 1 tablet (48 mg total) by mouth daily. 07/31/14   Lelon Perla, MD  furosemide (LASIX) 40 MG tablet Take 2 tablets (80 mg total) by mouth 2 (two) times daily. Patient taking differently: Take 40 mg by mouth 2 (two) times daily.  08/27/14   Lelon Perla, MD  Insulin Regular Human (HUMULIN R U-500, CONCENTRATED, [place]) Inject 18 Units into the skin. 18 U @ BREAKFAST AND 6 TO 8 U @ DINNER    Historical Provider, MD  montelukast (SINGULAIR) 10 MG tablet Take 10 mg by mouth at bedtime.    Historical Provider, MD  Multiple Vitamins-Minerals (CENTRUM SILVER PO) Take 1  tablet by mouth daily.    Historical Provider, MD  nitroGLYCERIN (NITROSTAT) 0.4 MG SL tablet Place 0.4 mg under the tongue every 5 (five) minutes as needed for chest pain.    Historical Provider, MD  olmesartan (BENICAR) 40 MG tablet Take 2 tablets (80 mg total) by mouth daily. 07/31/14   Lelon Perla, MD  omega-3 acid ethyl esters (LOVAZA) 1 G capsule Take 2 capsules (2 g total) by mouth 2 (two) times daily. 07/31/14   Lelon Perla, MD  potassium chloride (K-DUR) 10 MEQ tablet Take 2 tablets (20 mEq total) by mouth 2 (two) times daily. 07/31/14   Lelon Perla, MD  pravastatin (PRAVACHOL) 80 MG tablet Take 1 tablet (80 mg total) by mouth daily. 07/31/14   Lelon Perla, MD  Probiotic Product (PROBIOTIC DAILY PO) Take 1 tablet by mouth daily.    Historical Provider, MD  rivaroxaban (XARELTO) 20 MG TABS tablet Take 1 tablet (20 mg total) by mouth daily with supper. 07/31/14   Lelon Perla, MD  spironolactone (ALDACTONE) 25 MG tablet Take 1 tablet (25 mg total) by mouth daily. 08/27/14   Lelon Perla, MD     Positive ROS: All other systems have been reviewed and were otherwise negative with the exception of those mentioned in the HPI and as above.  Physical Exam: General: Alert, no acute distress Cardiovascular: No pedal edema Respiratory: No cyanosis, no use of accessory musculature GI: abdomen soft Skin: No lesions in the area of chief complaint Neurologic: Sensation intact distally Psychiatric: Patient is competent for consent with normal mood and affect Lymphatic: no lymphedema  MUSCULOSKELETAL: exam stable  Assessment: Right knee degenerative joint disease  Plan: Plan for Procedure(s): RIGHT TOTAL KNEE ARTHROPLASTY  The risks benefits and alternatives were discussed with the patient including but not limited to the risks of nonoperative treatment, versus surgical intervention including infection, bleeding, nerve injury,  blood clots, cardiopulmonary  complications, morbidity, mortality, among others, and they were willing to proceed.   Marianna Payment, MD   11/28/2014 8:43 PM

## 2014-11-29 ENCOUNTER — Inpatient Hospital Stay (HOSPITAL_COMMUNITY): Payer: Medicare Other

## 2014-11-29 ENCOUNTER — Encounter (HOSPITAL_COMMUNITY)
Admission: RE | Disposition: A | Discharge status: Home Health | Payer: Self-pay | Source: Ambulatory Visit | Attending: Orthopaedic Surgery

## 2014-11-29 ENCOUNTER — Inpatient Hospital Stay (HOSPITAL_COMMUNITY): Payer: Medicare Other | Admitting: Certified Registered"

## 2014-11-29 ENCOUNTER — Inpatient Hospital Stay (HOSPITAL_COMMUNITY): Payer: Medicare Other | Admitting: Emergency Medicine

## 2014-11-29 ENCOUNTER — Encounter (HOSPITAL_COMMUNITY): Payer: Self-pay | Admitting: *Deleted

## 2014-11-29 ENCOUNTER — Inpatient Hospital Stay (HOSPITAL_COMMUNITY)
Admission: RE | Admit: 2014-11-29 | Discharge: 2014-12-04 | DRG: 470 | Disposition: A | Discharge status: Home Health | Payer: Medicare Other | Source: Ambulatory Visit | Attending: Orthopaedic Surgery | Admitting: Orthopaedic Surgery

## 2014-11-29 DIAGNOSIS — E1165 Type 2 diabetes mellitus with hyperglycemia: Secondary | ICD-10-CM | POA: Diagnosis present

## 2014-11-29 DIAGNOSIS — I2510 Atherosclerotic heart disease of native coronary artery without angina pectoris: Secondary | ICD-10-CM | POA: Diagnosis present

## 2014-11-29 DIAGNOSIS — Z79899 Other long term (current) drug therapy: Secondary | ICD-10-CM | POA: Diagnosis not present

## 2014-11-29 DIAGNOSIS — K5900 Constipation, unspecified: Secondary | ICD-10-CM | POA: Diagnosis not present

## 2014-11-29 DIAGNOSIS — Z91041 Radiographic dye allergy status: Secondary | ICD-10-CM | POA: Diagnosis not present

## 2014-11-29 DIAGNOSIS — Z96651 Presence of right artificial knee joint: Secondary | ICD-10-CM

## 2014-11-29 DIAGNOSIS — I255 Ischemic cardiomyopathy: Secondary | ICD-10-CM | POA: Diagnosis present

## 2014-11-29 DIAGNOSIS — Z96659 Presence of unspecified artificial knee joint: Secondary | ICD-10-CM

## 2014-11-29 DIAGNOSIS — I4891 Unspecified atrial fibrillation: Secondary | ICD-10-CM | POA: Diagnosis present

## 2014-11-29 DIAGNOSIS — E785 Hyperlipidemia, unspecified: Secondary | ICD-10-CM | POA: Diagnosis present

## 2014-11-29 DIAGNOSIS — M549 Dorsalgia, unspecified: Secondary | ICD-10-CM | POA: Diagnosis present

## 2014-11-29 DIAGNOSIS — Z91048 Other nonmedicinal substance allergy status: Secondary | ICD-10-CM | POA: Diagnosis not present

## 2014-11-29 DIAGNOSIS — I10 Essential (primary) hypertension: Secondary | ICD-10-CM | POA: Diagnosis not present

## 2014-11-29 DIAGNOSIS — Z6841 Body Mass Index (BMI) 40.0 and over, adult: Secondary | ICD-10-CM | POA: Diagnosis not present

## 2014-11-29 DIAGNOSIS — G8929 Other chronic pain: Secondary | ICD-10-CM | POA: Diagnosis present

## 2014-11-29 DIAGNOSIS — I509 Heart failure, unspecified: Secondary | ICD-10-CM | POA: Diagnosis present

## 2014-11-29 DIAGNOSIS — Z95810 Presence of automatic (implantable) cardiac defibrillator: Secondary | ICD-10-CM | POA: Diagnosis not present

## 2014-11-29 DIAGNOSIS — Z96652 Presence of left artificial knee joint: Secondary | ICD-10-CM | POA: Diagnosis present

## 2014-11-29 DIAGNOSIS — Z794 Long term (current) use of insulin: Secondary | ICD-10-CM

## 2014-11-29 DIAGNOSIS — G629 Polyneuropathy, unspecified: Secondary | ICD-10-CM | POA: Diagnosis present

## 2014-11-29 DIAGNOSIS — M179 Osteoarthritis of knee, unspecified: Secondary | ICD-10-CM | POA: Diagnosis not present

## 2014-11-29 DIAGNOSIS — I252 Old myocardial infarction: Secondary | ICD-10-CM | POA: Diagnosis not present

## 2014-11-29 DIAGNOSIS — G4733 Obstructive sleep apnea (adult) (pediatric): Secondary | ICD-10-CM | POA: Diagnosis present

## 2014-11-29 DIAGNOSIS — M1711 Unilateral primary osteoarthritis, right knee: Secondary | ICD-10-CM | POA: Diagnosis not present

## 2014-11-29 DIAGNOSIS — D62 Acute posthemorrhagic anemia: Secondary | ICD-10-CM | POA: Diagnosis not present

## 2014-11-29 DIAGNOSIS — Z471 Aftercare following joint replacement surgery: Secondary | ICD-10-CM | POA: Diagnosis not present

## 2014-11-29 DIAGNOSIS — E118 Type 2 diabetes mellitus with unspecified complications: Secondary | ICD-10-CM | POA: Diagnosis not present

## 2014-11-29 DIAGNOSIS — Z951 Presence of aortocoronary bypass graft: Secondary | ICD-10-CM

## 2014-11-29 DIAGNOSIS — M25561 Pain in right knee: Secondary | ICD-10-CM | POA: Diagnosis not present

## 2014-11-29 DIAGNOSIS — G8918 Other acute postprocedural pain: Secondary | ICD-10-CM | POA: Diagnosis not present

## 2014-11-29 HISTORY — PX: TOTAL KNEE ARTHROPLASTY: SHX125

## 2014-11-29 HISTORY — DX: Presence of unspecified artificial knee joint: Z96.659

## 2014-11-29 LAB — GLUCOSE, CAPILLARY
GLUCOSE-CAPILLARY: 196 mg/dL — AB (ref 70–99)
Glucose-Capillary: 211 mg/dL — ABNORMAL HIGH (ref 70–99)
Glucose-Capillary: 192 mg/dL — ABNORMAL HIGH (ref 70–99)
Glucose-Capillary: 230 mg/dL — ABNORMAL HIGH (ref 70–99)

## 2014-11-29 LAB — PROTIME-INR
INR: 1.11 (ref 0.00–1.49)
Prothrombin Time: 14.5 seconds (ref 11.6–15.2)

## 2014-11-29 LAB — APTT: aPTT: 33 seconds (ref 24–37)

## 2014-11-29 LAB — PLATELET COUNT: Platelets: 84 10*3/uL — ABNORMAL LOW (ref 150–400)

## 2014-11-29 SURGERY — ARTHROPLASTY, KNEE, TOTAL
Anesthesia: Regional | Site: Knee | Laterality: Right

## 2014-11-29 MED ORDER — ALUM & MAG HYDROXIDE-SIMETH 200-200-20 MG/5ML PO SUSP: 30.0000 mL | ORAL | Status: DC | PRN

## 2014-11-29 MED ORDER — KETOROLAC TROMETHAMINE 30 MG/ML IJ SOLN
INTRAMUSCULAR | Status: AC
  Administered 2014-11-29: 30 mg via INTRAVENOUS
  Filled 2014-11-29: qty 1

## 2014-11-29 MED ORDER — ONDANSETRON HCL 4 MG/2ML IJ SOLN: 4.0000 mg | Freq: Four times a day (QID) | INTRAMUSCULAR | Status: DC | PRN

## 2014-11-29 MED ORDER — MAGNESIUM CITRATE PO SOLN: 1.0000 | Freq: Once | ORAL | Status: AC | PRN

## 2014-11-29 MED ORDER — OMEGA-3-ACID ETHYL ESTERS 1 G PO CAPS
2.0000 g | ORAL_CAPSULE | Freq: Two times a day (BID) | ORAL | Status: DC
  Administered 2014-11-29 – 2014-11-30 (×3): 2 g via ORAL
  Administered 2014-12-01: 1 g via ORAL
  Administered 2014-12-01 – 2014-12-04 (×6): 2 g via ORAL
  Filled 2014-11-29 (×10): qty 2

## 2014-11-29 MED ORDER — LIDOCAINE HCL (CARDIAC) 20 MG/ML IV SOLN
INTRAVENOUS | Status: DC | PRN
  Administered 2014-11-29: 40 mg via INTRAVENOUS

## 2014-11-29 MED ORDER — PRAVASTATIN SODIUM 80 MG PO TABS
80.0000 mg | ORAL_TABLET | Freq: Every day | ORAL | Status: DC
  Administered 2014-11-29 – 2014-12-03 (×4): 80 mg via ORAL
  Filled 2014-11-29 (×6): qty 1

## 2014-11-29 MED ORDER — INSULIN ASPART 100 UNIT/ML ~~LOC~~ SOLN
0.0000 [IU] | Freq: Every day | SUBCUTANEOUS | Status: DC
  Administered 2014-11-29 – 2014-11-30 (×2): 2 [IU] via SUBCUTANEOUS

## 2014-11-29 MED ORDER — EPHEDRINE SULFATE 50 MG/ML IJ SOLN
INTRAMUSCULAR | Status: AC
  Filled 2014-11-29: qty 1

## 2014-11-29 MED ORDER — FENTANYL CITRATE 0.05 MG/ML IJ SOLN
INTRAMUSCULAR | Status: AC
Start: 2014-11-29 — End: 2014-11-29
  Filled 2014-11-29: qty 2

## 2014-11-29 MED ORDER — ROCURONIUM BROMIDE 50 MG/5ML IV SOLN
INTRAVENOUS | Status: AC
  Filled 2014-11-29: qty 1

## 2014-11-29 MED ORDER — LIDOCAINE HCL (CARDIAC) 20 MG/ML IV SOLN
INTRAVENOUS | Status: AC
  Filled 2014-11-29: qty 5

## 2014-11-29 MED ORDER — SORBITOL 70 % SOLN
30.0000 mL | Freq: Every day | Status: DC | PRN
  Administered 2014-12-01: 30 mL via ORAL
  Filled 2014-11-29: qty 30

## 2014-11-29 MED ORDER — METHOCARBAMOL 500 MG PO TABS
ORAL_TABLET | ORAL | Status: AC
  Administered 2014-11-29: 500 mg via ORAL
  Filled 2014-11-29: qty 1

## 2014-11-29 MED ORDER — ACETAMINOPHEN 500 MG PO TABS
1000.0000 mg | ORAL_TABLET | Freq: Four times a day (QID) | ORAL | Status: AC
  Administered 2014-11-29 – 2014-11-30 (×4): 1000 mg via ORAL
  Filled 2014-11-29 (×3): qty 2

## 2014-11-29 MED ORDER — METOCLOPRAMIDE HCL 5 MG/ML IJ SOLN: 5.0000 mg | Freq: Three times a day (TID) | INTRAMUSCULAR | Status: DC | PRN

## 2014-11-29 MED ORDER — SODIUM CHLORIDE 0.9 % IV SOLN: INTRAVENOUS | Status: DC

## 2014-11-29 MED ORDER — METHOCARBAMOL 500 MG PO TABS
500.0000 mg | ORAL_TABLET | Freq: Four times a day (QID) | ORAL | Status: DC | PRN
  Administered 2014-11-29 – 2014-12-03 (×9): 500 mg via ORAL
  Filled 2014-11-29 (×9): qty 1

## 2014-11-29 MED ORDER — MORPHINE SULFATE 2 MG/ML IJ SOLN
2.0000 mg | INTRAMUSCULAR | Status: DC | PRN
  Administered 2014-11-30 (×2): 2 mg via INTRAVENOUS
  Filled 2014-11-29 (×2): qty 1

## 2014-11-29 MED ORDER — STERILE WATER FOR INJECTION IJ SOLN
INTRAMUSCULAR | Status: AC
  Filled 2014-11-29: qty 10

## 2014-11-29 MED ORDER — MIDAZOLAM HCL 2 MG/2ML IJ SOLN
INTRAMUSCULAR | Status: AC
  Filled 2014-11-29: qty 2

## 2014-11-29 MED ORDER — FENTANYL CITRATE 0.05 MG/ML IJ SOLN
INTRAMUSCULAR | Status: AC
  Filled 2014-11-29: qty 5

## 2014-11-29 MED ORDER — HYDROMORPHONE HCL 1 MG/ML IJ SOLN
0.2500 mg | INTRAMUSCULAR | Status: DC | PRN
  Administered 2014-11-29 (×2): 0.25 mg via INTRAVENOUS
  Administered 2014-11-29: 0.5 mg via INTRAVENOUS

## 2014-11-29 MED ORDER — SUCCINYLCHOLINE CHLORIDE 20 MG/ML IJ SOLN
INTRAMUSCULAR | Status: DC | PRN
  Administered 2014-11-29: 100 mg via INTRAVENOUS

## 2014-11-29 MED ORDER — ONDANSETRON HCL 4 MG/2ML IJ SOLN
INTRAMUSCULAR | Status: DC | PRN
  Administered 2014-11-29: 4 mg via INTRAVENOUS

## 2014-11-29 MED ORDER — ONDANSETRON HCL 4 MG PO TABS: 4.0000 mg | ORAL_TABLET | Freq: Four times a day (QID) | ORAL | Status: DC | PRN

## 2014-11-29 MED ORDER — BUPIVACAINE LIPOSOME 1.3 % IJ SUSP
20.0000 mL | Freq: Once | INTRAMUSCULAR | Status: DC
  Filled 2014-11-29: qty 20

## 2014-11-29 MED ORDER — CARVEDILOL 12.5 MG PO TABS
12.5000 mg | ORAL_TABLET | Freq: Two times a day (BID) | ORAL | Status: DC
  Administered 2014-11-29 – 2014-12-04 (×10): 12.5 mg via ORAL
  Filled 2014-11-29 (×11): qty 1

## 2014-11-29 MED ORDER — RIVAROXABAN 20 MG PO TABS
20.0000 mg | ORAL_TABLET | Freq: Every day | ORAL | Status: DC
  Administered 2014-11-29 – 2014-12-03 (×5): 20 mg via ORAL
  Filled 2014-11-29 (×5): qty 1

## 2014-11-29 MED ORDER — CEFAZOLIN SODIUM-DEXTROSE 2-3 GM-% IV SOLR
2.0000 g | Freq: Four times a day (QID) | INTRAVENOUS | Status: AC
  Administered 2014-11-29 – 2014-11-30 (×2): 2 g via INTRAVENOUS
  Filled 2014-11-29 (×2): qty 50

## 2014-11-29 MED ORDER — METOCLOPRAMIDE HCL 10 MG PO TABS: 5.0000 mg | ORAL_TABLET | Freq: Three times a day (TID) | ORAL | Status: DC | PRN

## 2014-11-29 MED ORDER — DIPHENHYDRAMINE HCL 12.5 MG/5ML PO ELIX
25.0000 mg | ORAL_SOLUTION | ORAL | Status: DC | PRN
  Administered 2014-12-01: 25 mg via ORAL
  Filled 2014-11-29: qty 10

## 2014-11-29 MED ORDER — ACETAMINOPHEN 650 MG RE SUPP
650.0000 mg | Freq: Four times a day (QID) | RECTAL | Status: DC | PRN
  Filled 2014-11-29: qty 1

## 2014-11-29 MED ORDER — DEXTROSE 5 % IV SOLN
500.0000 mg | Freq: Four times a day (QID) | INTRAVENOUS | Status: DC | PRN
  Filled 2014-11-29: qty 5

## 2014-11-29 MED ORDER — OXYCODONE HCL 5 MG PO TABS
5.0000 mg | ORAL_TABLET | ORAL | Status: DC | PRN
  Administered 2014-11-29 – 2014-12-03 (×9): 10 mg via ORAL
  Filled 2014-11-29 (×8): qty 2

## 2014-11-29 MED ORDER — GLYCOPYRROLATE 0.2 MG/ML IJ SOLN
INTRAMUSCULAR | Status: DC | PRN
  Administered 2014-11-29: 0.4 mg via INTRAVENOUS
  Administered 2014-11-29: 0.2 mg via INTRAVENOUS

## 2014-11-29 MED ORDER — RIVAROXABAN 10 MG PO TABS: 10.0000 mg | ORAL_TABLET | Freq: Every day | ORAL | Status: DC

## 2014-11-29 MED ORDER — KETOROLAC TROMETHAMINE 30 MG/ML IJ SOLN
30.0000 mg | Freq: Four times a day (QID) | INTRAMUSCULAR | Status: AC | PRN
  Administered 2014-11-29: 30 mg via INTRAVENOUS
  Filled 2014-11-29: qty 1

## 2014-11-29 MED ORDER — SENNA 8.6 MG PO TABS
1.0000 | ORAL_TABLET | Freq: Two times a day (BID) | ORAL | Status: DC
  Administered 2014-11-29 – 2014-12-04 (×10): 8.6 mg via ORAL
  Filled 2014-11-29 (×10): qty 1

## 2014-11-29 MED ORDER — CEFUROXIME SODIUM 1.5 G IJ SOLR
INTRAMUSCULAR | Status: AC
  Filled 2014-11-29: qty 1.5

## 2014-11-29 MED ORDER — POLYETHYLENE GLYCOL 3350 17 G PO PACK
17.0000 g | PACK | Freq: Every day | ORAL | Status: DC | PRN
  Administered 2014-12-02 – 2014-12-03 (×2): 17 g via ORAL
  Filled 2014-11-29 (×2): qty 1

## 2014-11-29 MED ORDER — PHENYLEPHRINE HCL 10 MG/ML IJ SOLN
INTRAMUSCULAR | Status: DC | PRN
  Administered 2014-11-29 (×2): 80 ug via INTRAVENOUS
  Administered 2014-11-29: 40 ug via INTRAVENOUS
  Administered 2014-11-29: 80 ug via INTRAVENOUS
  Administered 2014-11-29: 40 ug via INTRAVENOUS

## 2014-11-29 MED ORDER — PROPOFOL 10 MG/ML IV BOLUS
INTRAVENOUS | Status: DC | PRN
  Administered 2014-11-29: 200 mg via INTRAVENOUS

## 2014-11-29 MED ORDER — ROPIVACAINE HCL 5 MG/ML IJ SOLN
INTRAMUSCULAR | Status: DC | PRN
  Administered 2014-11-29: 20 mL via PERINEURAL

## 2014-11-29 MED ORDER — FENOFIBRATE 54 MG PO TABS
54.0000 mg | ORAL_TABLET | Freq: Every day | ORAL | Status: DC
  Administered 2014-11-29 – 2014-12-04 (×6): 54 mg via ORAL
  Filled 2014-11-29 (×6): qty 1

## 2014-11-29 MED ORDER — OXYCODONE HCL 5 MG/5ML PO SOLN: 5.0000 mg | Freq: Once | ORAL | Status: DC | PRN

## 2014-11-29 MED ORDER — NITROGLYCERIN 0.4 MG SL SUBL: 0.4000 mg | SUBLINGUAL_TABLET | SUBLINGUAL | Status: DC | PRN

## 2014-11-29 MED ORDER — PROPOFOL 10 MG/ML IV BOLUS
INTRAVENOUS | Status: AC
  Filled 2014-11-29: qty 20

## 2014-11-29 MED ORDER — SODIUM CHLORIDE 0.9 % IJ SOLN
INTRAMUSCULAR | Status: DC | PRN
  Administered 2014-11-29: 40 mL

## 2014-11-29 MED ORDER — SPIRONOLACTONE 25 MG PO TABS
25.0000 mg | ORAL_TABLET | Freq: Every day | ORAL | Status: DC
  Administered 2014-11-30 – 2014-12-04 (×5): 25 mg via ORAL
  Filled 2014-11-29 (×5): qty 1

## 2014-11-29 MED ORDER — OXYCODONE HCL 5 MG PO TABS: 5.0000 mg | ORAL_TABLET | Freq: Once | ORAL | Status: DC | PRN

## 2014-11-29 MED ORDER — LACTATED RINGERS IV SOLN
INTRAVENOUS | Status: DC
  Administered 2014-11-29: 11:00:00 via INTRAVENOUS

## 2014-11-29 MED ORDER — ONDANSETRON HCL 4 MG/2ML IJ SOLN
INTRAMUSCULAR | Status: AC
  Filled 2014-11-29: qty 2

## 2014-11-29 MED ORDER — OXYCODONE HCL ER 15 MG PO T12A
15.0000 mg | EXTENDED_RELEASE_TABLET | Freq: Two times a day (BID) | ORAL | Status: DC
  Administered 2014-11-29 – 2014-12-02 (×7): 15 mg via ORAL
  Filled 2014-11-29 (×7): qty 1

## 2014-11-29 MED ORDER — FENTANYL CITRATE 0.05 MG/ML IJ SOLN
50.0000 ug | Freq: Once | INTRAMUSCULAR | Status: AC
  Administered 2014-11-29: 50 ug via INTRAVENOUS

## 2014-11-29 MED ORDER — EZETIMIBE 10 MG PO TABS
10.0000 mg | ORAL_TABLET | Freq: Every day | ORAL | Status: DC
  Administered 2014-11-30 – 2014-12-04 (×5): 10 mg via ORAL
  Filled 2014-11-29 (×5): qty 1

## 2014-11-29 MED ORDER — EPHEDRINE SULFATE 50 MG/ML IJ SOLN
INTRAMUSCULAR | Status: DC | PRN
  Administered 2014-11-29: 10 mg via INTRAVENOUS
  Administered 2014-11-29 (×2): 5 mg via INTRAVENOUS
  Administered 2014-11-29 (×2): 10 mg via INTRAVENOUS
  Administered 2014-11-29 (×3): 5 mg via INTRAVENOUS
  Administered 2014-11-29: 20 mg via INTRAVENOUS
  Administered 2014-11-29: 10 mg via INTRAVENOUS

## 2014-11-29 MED ORDER — FENTANYL CITRATE 0.05 MG/ML IJ SOLN
INTRAMUSCULAR | Status: DC | PRN
Start: 2014-11-29 — End: 2014-11-29
  Administered 2014-11-29 (×2): 50 ug via INTRAVENOUS
  Administered 2014-11-29: 150 ug via INTRAVENOUS
  Administered 2014-11-29 (×3): 50 ug via INTRAVENOUS

## 2014-11-29 MED ORDER — IRBESARTAN 300 MG PO TABS
300.0000 mg | ORAL_TABLET | Freq: Every day | ORAL | Status: DC
  Administered 2014-11-30 – 2014-12-04 (×5): 300 mg via ORAL
  Filled 2014-11-29 (×3): qty 1
  Filled 2014-11-29: qty 4
  Filled 2014-11-29: qty 1

## 2014-11-29 MED ORDER — MENTHOL 3 MG MT LOZG: 1.0000 | LOZENGE | OROMUCOSAL | Status: DC | PRN

## 2014-11-29 MED ORDER — BUPIVACAINE LIPOSOME 1.3 % IJ SUSP
INTRAMUSCULAR | Status: DC | PRN
  Administered 2014-11-29: 20 mL

## 2014-11-29 MED ORDER — HYDROMORPHONE HCL 1 MG/ML IJ SOLN
INTRAMUSCULAR | Status: AC
  Administered 2014-11-29: 0.5 mg via INTRAVENOUS
  Filled 2014-11-29: qty 1

## 2014-11-29 MED ORDER — POTASSIUM CHLORIDE ER 10 MEQ PO TBCR
20.0000 meq | EXTENDED_RELEASE_TABLET | Freq: Two times a day (BID) | ORAL | Status: DC
  Administered 2014-11-29 – 2014-12-04 (×10): 20 meq via ORAL
  Filled 2014-11-29 (×13): qty 2

## 2014-11-29 MED ORDER — PROMETHAZINE HCL 25 MG/ML IJ SOLN: 6.2500 mg | INTRAMUSCULAR | Status: DC | PRN

## 2014-11-29 MED ORDER — OXYCODONE HCL 5 MG PO TABS
ORAL_TABLET | ORAL | Status: AC
  Administered 2014-11-29: 10 mg via ORAL
  Filled 2014-11-29: qty 2

## 2014-11-29 MED ORDER — LACTATED RINGERS IV SOLN
INTRAVENOUS | Status: DC | PRN
  Administered 2014-11-29 (×2): via INTRAVENOUS

## 2014-11-29 MED ORDER — PHENOL 1.4 % MT LIQD: 1.0000 | OROMUCOSAL | Status: DC | PRN

## 2014-11-29 MED ORDER — MONTELUKAST SODIUM 10 MG PO TABS
10.0000 mg | ORAL_TABLET | Freq: Every day | ORAL | Status: DC
  Administered 2014-11-29 – 2014-12-03 (×5): 10 mg via ORAL
  Filled 2014-11-29 (×5): qty 1

## 2014-11-29 MED ORDER — RIVAROXABAN 20 MG PO TABS
20.0000 mg | ORAL_TABLET | Freq: Every day | ORAL | Status: DC
Start: 2014-11-29 — End: 2014-11-29

## 2014-11-29 MED ORDER — 0.9 % SODIUM CHLORIDE (POUR BTL) OPTIME
TOPICAL | Status: DC | PRN
  Administered 2014-11-29: 1000 mL

## 2014-11-29 MED ORDER — ACETAMINOPHEN 325 MG PO TABS
650.0000 mg | ORAL_TABLET | Freq: Four times a day (QID) | ORAL | Status: DC | PRN
  Administered 2014-12-01: 650 mg via ORAL
  Filled 2014-11-29 (×3): qty 2

## 2014-11-29 MED ORDER — INSULIN ASPART 100 UNIT/ML ~~LOC~~ SOLN
0.0000 [IU] | Freq: Three times a day (TID) | SUBCUTANEOUS | Status: DC
  Administered 2014-11-29: 3 [IU] via SUBCUTANEOUS
  Administered 2014-11-30: 8 [IU] via SUBCUTANEOUS
  Administered 2014-11-30 – 2014-12-01 (×3): 5 [IU] via SUBCUTANEOUS

## 2014-11-29 SURGICAL SUPPLY — 67 items
ADH SKN CLS APL DERMABOND .7 (GAUZE/BANDAGES/DRESSINGS)
BANDAGE ELASTIC 6 VELCRO ST LF (GAUZE/BANDAGES/DRESSINGS) ×6 IMPLANT
BANDAGE ESMARK 6X9 LF (GAUZE/BANDAGES/DRESSINGS) ×1 IMPLANT
BLADE SAG 18X100X1.27 (BLADE) ×2 IMPLANT
BLADE SAW SGTL 13.0X1.19X90.0M (BLADE) ×2 IMPLANT
BNDG CMPR 9X6 STRL LF SNTH (GAUZE/BANDAGES/DRESSINGS) ×1
BNDG ESMARK 6X9 LF (GAUZE/BANDAGES/DRESSINGS) ×3
BONE CEMENT PALACOSE (Orthopedic Implant) ×6 IMPLANT
BOWL SMART MIX CTS (DISPOSABLE) ×2 IMPLANT
CAPT KNEE TOTAL 3 ×2 IMPLANT
CEMENT BONE PALACOSE (Orthopedic Implant) ×2 IMPLANT
COVER SURGICAL LIGHT HANDLE (MISCELLANEOUS) ×3 IMPLANT
CUFF TOURNIQUET SINGLE 34IN LL (TOURNIQUET CUFF) ×3 IMPLANT
CUFF TOURNIQUET SINGLE 44IN (TOURNIQUET CUFF) IMPLANT
DERMABOND ADVANCED (GAUZE/BANDAGES/DRESSINGS)
DERMABOND ADVANCED .7 DNX12 (GAUZE/BANDAGES/DRESSINGS) ×1 IMPLANT
DRAPE EXTREMITY T 121X128X90 (DRAPE) ×3 IMPLANT
DRAPE IMP U-DRAPE 54X76 (DRAPES) ×3 IMPLANT
DRAPE INCISE IOBAN 66X45 STRL (DRAPES) ×3 IMPLANT
DRAPE ORTHO SPLIT 77X108 STRL (DRAPES) ×6
DRAPE PROXIMA HALF (DRAPES) ×3 IMPLANT
DRAPE SURG 17X11 SM STRL (DRAPES) ×6 IMPLANT
DRAPE SURG ORHT 6 SPLT 77X108 (DRAPES) ×2 IMPLANT
DRAPE U-SHAPE 47X51 STRL (DRAPES) ×1 IMPLANT
DRSG AQUACEL AG ADV 3.5X14 (GAUZE/BANDAGES/DRESSINGS) ×3 IMPLANT
DRSG PAD ABDOMINAL 8X10 ST (GAUZE/BANDAGES/DRESSINGS) ×1 IMPLANT
DURAPREP 26ML APPLICATOR (WOUND CARE) ×9 IMPLANT
ELECT CAUTERY BLADE 6.4 (BLADE) ×3 IMPLANT
ELECT REM PT RETURN 9FT ADLT (ELECTROSURGICAL) ×3
ELECTRODE REM PT RTRN 9FT ADLT (ELECTROSURGICAL) ×1 IMPLANT
EVACUATOR 1/8 PVC DRAIN (DRAIN) ×2 IMPLANT
FACESHIELD WRAPAROUND (MASK) ×9 IMPLANT
FACESHIELD WRAPAROUND OR TEAM (MASK) ×3 IMPLANT
GAUZE SPONGE 4X4 12PLY STRL (GAUZE/BANDAGES/DRESSINGS) ×1 IMPLANT
GLOVE NEODERM STRL 7.5 LF PF (GLOVE) ×2 IMPLANT
GLOVE SURG NEODERM 7.5  LF PF (GLOVE) ×4
GOWN STRL REIN XL XLG (GOWN DISPOSABLE) ×5 IMPLANT
HANDPIECE INTERPULSE COAX TIP (DISPOSABLE) ×3
IMMOBILIZER KNEE 22 UNIV (SOFTGOODS) ×2 IMPLANT
KIT BASIN OR (CUSTOM PROCEDURE TRAY) ×3 IMPLANT
KIT ROOM TURNOVER OR (KITS) ×3 IMPLANT
MANIFOLD NEPTUNE II (INSTRUMENTS) ×3 IMPLANT
NDL SPNL 18GX3.5 QUINCKE PK (NEEDLE) ×1 IMPLANT
NEEDLE SPNL 18GX3.5 QUINCKE PK (NEEDLE) ×3 IMPLANT
NS IRRIG 1000ML POUR BTL (IV SOLUTION) ×3 IMPLANT
PACK TOTAL JOINT (CUSTOM PROCEDURE TRAY) ×3 IMPLANT
PACK UNIVERSAL I (CUSTOM PROCEDURE TRAY) ×3 IMPLANT
PAD ARMBOARD 7.5X6 YLW CONV (MISCELLANEOUS) ×6 IMPLANT
PADDING CAST COTTON 6X4 STRL (CAST SUPPLIES) ×1 IMPLANT
SET HNDPC FAN SPRY TIP SCT (DISPOSABLE) IMPLANT
SET PAD KNEE POSITIONER (MISCELLANEOUS) ×3 IMPLANT
STAPLER VISISTAT 35W (STAPLE) IMPLANT
SUCTION FRAZIER TIP 10 FR DISP (SUCTIONS) IMPLANT
SUT ETHILON 2 0 FS 18 (SUTURE) ×6 IMPLANT
SUT PDS AB 1 CT  36 (SUTURE) ×4
SUT PDS AB 1 CT 36 (SUTURE) ×2 IMPLANT
SUT VIC AB 0 CT1 27 (SUTURE) ×3
SUT VIC AB 0 CT1 27XBRD ANBCTR (SUTURE) IMPLANT
SUT VIC AB 1 CT1 27 (SUTURE) ×15
SUT VIC AB 1 CT1 27XBRD ANBCTR (SUTURE) ×2 IMPLANT
SUT VIC AB 2-0 CT1 27 (SUTURE) ×3
SUT VIC AB 2-0 CT1 TAPERPNT 27 (SUTURE) ×3 IMPLANT
SYR 50ML LL SCALE MARK (SYRINGE) ×3 IMPLANT
TOWEL OR 17X24 6PK STRL BLUE (TOWEL DISPOSABLE) ×3 IMPLANT
TOWEL OR 17X26 10 PK STRL BLUE (TOWEL DISPOSABLE) ×3 IMPLANT
WATER STERILE IRR 1000ML POUR (IV SOLUTION) ×4 IMPLANT
WRAP KNEE MAXI GEL POST OP (GAUZE/BANDAGES/DRESSINGS) ×3 IMPLANT

## 2014-11-29 NOTE — Evaluation (Signed)
Physical Therapy Evaluation Patient Details Name: Robert Gregory MRN: [id number] DOB: 27-Aug-1941 Today's Date: 11/29/2014   History of Present Illness  Patient is a 74 yo male admitted 11/29/14 now s/p Rt TKA.  PMH:  CAD, MI, ICM, ICD placement, Afib, HTN, CABG, HLD, OSA, spinal stenosis, laminectomy, DM, peripheral neuropathy, CHF, arthritis, s/p Lt TKA, rotator cuff repair  Clinical Impression  Patient presents with problems listed below.  Will benefit from acute PT to maximize independence prior to discharge home with wife.    Follow Up Recommendations Home health PT;Supervision/Assistance - 24 hour    Equipment Recommendations  None recommended by PT    Recommendations for Other Services       Precautions / Restrictions Precautions Precautions: Knee Precaution Booklet Issued: Yes (comment) Precaution Comments: Reviewed precautions with patient and wife Required Braces or Orthoses: Knee Immobilizer - Right Knee Immobilizer - Right: On when out of bed or walking Restrictions Weight Bearing Restrictions: Yes RLE Weight Bearing: Weight bearing as tolerated      Mobility  Bed Mobility Overal bed mobility: Needs Assistance Bed Mobility: Supine to Sit     Supine to sit: Min assist     General bed mobility comments: Verbal cues for technique.  Assist to bring RLE off of bed.  Once upright, patient with good sitting balance.  Transfers Overall transfer level: Needs assistance Equipment used: Rolling walker (2 wheeled) Transfers: Sit to/from [facility] Sit to Stand: Min assist Stand pivot transfers: Min assist       General transfer comment: Verbal cues for hand placement and technique.  Assist for balance/safety during transitions.  Patient able to take several shuffle steps to pivot to recliner.  Ambulation/Gait                Stairs            Wheelchair Mobility    Modified Rankin (Stroke Patients Only)       Balance                                              Pertinent Vitals/Pain Pain Assessment: 0-10 Pain Score: 3  Pain Location: Rt knee Pain Descriptors / Indicators: Sore Pain Intervention(s): Monitored during session;Premedicated before session;Repositioned    Home Living Family/patient expects to be discharged to:: Private residence Living Arrangements: Spouse/significant other Available Help at Discharge: Family;Available 24 hours/day Type of Home: House Home Access: Level entry     Home Layout: One level Home Equipment: Walker - 2 wheels;Toilet riser;Cane - single point      Prior Function Level of Independence: Independent with assistive device(s)         Comments: Used cane for longer distances due to knee pain     Hand Dominance        Extremity/Trunk Assessment   Upper Extremity Assessment: Overall WFL for tasks assessed           Lower Extremity Assessment: RLE deficits/detail RLE Deficits / Details: Decreased strength and ROM post-op       Communication   Communication: No difficulties  Cognition Arousal/Alertness: Awake/alert Behavior During Therapy: WFL for tasks assessed/performed Overall Cognitive Status: Within Functional Limits for tasks assessed                      General Comments      Exercises Total  Joint Exercises Ankle Circles/Pumps: AROM;Both;10 reps;Seated      Assessment/Plan    PT Assessment Patient needs continued PT services  PT Diagnosis Difficulty walking;Acute pain   PT Problem List Decreased strength;Decreased range of motion;Decreased activity tolerance;Decreased balance;Decreased mobility;Decreased knowledge of use of DME;Decreased knowledge of precautions;Obesity;Pain  PT Treatment Interventions DME instruction;Gait training;Functional mobility training;Therapeutic activities;Therapeutic exercise;Patient/family education   PT Goals (Current goals can be found in the Care Plan section) Acute Rehab PT  Goals Patient Stated Goal: To go home soon PT Goal Formulation: With patient/family Time For Goal Achievement: 12/06/14 Potential to Achieve Goals: Good    Frequency 7X/week   Barriers to discharge        Co-evaluation               End of Session Equipment Utilized During Treatment: Gait belt;Right knee immobilizer;Oxygen Activity Tolerance: Patient tolerated treatment well Patient left: in chair;with call bell/phone within reach;with family/visitor present Nurse Communication: Mobility status         Time: 1191-4782 PT Time Calculation (min) (ACUTE ONLY): 20 min   Charges:   PT Evaluation $Initial PT Evaluation Tier I: 1 Procedure     PT G CodesDespina Pole 12-21-14, 7:22 PM Carita Pian. Sanjuana Kava, [place] Pager [phone]

## 2014-11-29 NOTE — Progress Notes (Signed)
Orthopedic Tech Progress Note Patient Details:  Robert Gregory 1941/02/28 [id number] CPM applied to RLE with appropriate settings. OHF applied to bed. Footsie roll provided.  CPM Right Knee CPM Right Knee: On Right Knee Flexion (Degrees): 90 Right Knee Extension (Degrees): 0   Robert Gregory 11/29/2014, 3:47 PM

## 2014-11-29 NOTE — Transfer of Care (Signed)
Immediate Anesthesia Transfer of Care Note  Patient: Robert Gregory  Procedure(s) Performed: Procedure(s): RIGHT TOTAL KNEE ARTHROPLASTY (Right)  Patient Location: PACU  Anesthesia Type:General and Regional  Level of Consciousness: awake, alert  and oriented  Airway & Oxygen Therapy: Patient Spontanous Breathing and Patient connected to nasal cannula oxygen  Post-op Assessment: Report given to RN, Post -op Vital signs reviewed and stable and Patient moving all extremities X 4  Post vital signs: Reviewed and stable  Last Vitals:  Filed Vitals:   11/29/14 1205  BP:   Pulse: 81  Temp:   Resp: 17    Complications: No apparent anesthesia complications

## 2014-11-29 NOTE — Progress Notes (Signed)
Utilization review completed.  

## 2014-11-29 NOTE — Progress Notes (Signed)
Placed patient on CPAP for the night at home settings of 14 cm with oxygen set at 3lpm

## 2014-11-29 NOTE — Op Note (Signed)
Total Knee Arthroplasty Procedure Note ROXANNE ORNER [id number] 11/29/2014   Preoperative diagnosis: Right knee osteoarthritis  Postoperative diagnosis:same  Operative procedure: Right total knee arthroplasty. CPT [phone]  Surgeon: N. Eduard Roux, MD  Co-surgeon: none  Assistants: Laure Kidney, RNFA  Anesthesia: Spinal, regional  Tourniquet time: see anesthesia note  Implants used: [employer]hew  Femur: Legion PS Size 7 Tibia:Genesis II Size 6 Patella: 35 mm 9.5 mm thick Polyethylene: 9 mm  Indication: ESTIVEN KOHAN is a 74 y.o. year old male with a history of knee pain. Having failed conservative management, the patient elected to proceed with a total knee arthroplasty.  We have reviewed the risk and benefits of the surgery and they elected to proceed after voicing understanding.  Procedure:  After informed consent was obtained and understanding of the risk were voiced including but not limited to bleeding, infection, damage to surrounding structures including nerves and vessels, blood clots, leg length inequality and the failure to achieve desired results, the operative extremity was marked with verbal confirmation of the patient in the holding area.   The patient was then brought to the operating room and transported to the operating room table in the supine position.  A tourniquet was applied to the operative extremity around the upper thigh. The operative limb was then prepped and draped in the usual sterile fashion and preoperative antibiotics were administered.  A time out was performed prior to the start of surgery confirming the correct extremity, preoperative antibiotic administration, as well as team members, implants and instruments available for the case. Correct surgical site was also confirmed with preoperative radiographs. The limb was then elevated for exsanguination and the tourniquet was inflated. A midline incision was made and a standard medial parapatellar  approach was performed.  The patella was prepared and sized to a 35 mm.  A cover was placed on the patella for protection from retractors.  We then turned our attention to the femur. Posterior cruciate ligament was sacrificed. Start site was drilled in the femur and the intramedullary distal femoral cutting guide was placed, set at 5 degrees valgus, taking 11 mm of distal resection. The distal cut was made. Osteophytes were then removed. Next, the proximal tibial cutting guide was placed with appropriate slope, varus/valgus alignment and depth of resection. The proximal tibial cut was made. Gap blocks were then used to assess the extension gap and alignment, and appropriate soft tissue releases were performed. Attention was turned back to the femur, which was sized using the sizing guide to a size 7. Appropriate rotation of the femoral component was determined using epicondylar axis, Whiteside's line, and assessing the flexion gap under ligament tension. The appropriate size 4-in-1 cutting block was placed and cuts were made. Posterior femoral osteophytes and uncapped bone were then removed with the curved osteotome. The tibia was sized for a size 6 component. The femoral box-cutting guide was placed and prepared for a PS femoral component. Trial components were placed, and stability was checked in full extension, mid-flexion, and deep flexion. Proper tibial rotation was determined and marked.  The patella tracked well without a lateral release. Trial components were then removed and tibial preparation performed. A posterior capsular injection comprising of 20 cc of 1.3% exparel and 40 cc of normal saline was performed for postoperative pain control. The bony surfaces were irrigated with a pulse lavage and then dried. Bone cement was vacuum mixed on the back table, and the final components sized above were cemented into  place. After cement had finished curing, excess cement was removed. The stability of the  construct was re-evaluated throughout a range of motion and found to be acceptable. The trial liner was removed, the knee was copiously irrigated, and the knee was re-evaluated for any excess bone debris. The real polyethylene liner, 9 mm thick, was inserted and checked to ensure the locking mechanism had engaged appropriately. The tourniquet was deflated and hemostasis was achieved. The wound was irrigated with normal saline. A drain was placed. Capsular closure was performed with a #1 vicryl, subcutaneous fat closed with a 0 vicryl suture, then subcutaneous tissue closed with interrupted 2.0 vicryl suture. The skin was then closed with a staples. A sterile dressing was applied.   The patient was awakened in the operating room and taken to recovery in stable condition. All sponge, needle, and instrument counts were correct at the end of the case.  Position: supine  Complications: none.  Time Out: performed   Drains/Packing: 1 HVAC  Estimated blood loss: 200 cc  Returned to Recovery Room: in good condition.   Antibiotics: yes   Mechanical VTE (DVT) Prophylaxis: sequential compression devices, TED thigh-high  Chemical VTE (DVT) Prophylaxis: xarelto  Fluid Replacement  Crystalloid: see anesthesia record Blood: none  FFP: none   Specimens Removed: 1 to pathology   Sponge and Instrument Count Correct? yes   PACU: portable radiograph - knee AP and Lateral   Admission: inpatient status, start PT & OT POD#1  Plan/RTC: Return in 2 weeks for wound check.   Weight Bearing/Load Lower Extremity: full   N. Eduard Roux, MD [facility] [phone] 2:52 PM

## 2014-11-29 NOTE — Anesthesia Preprocedure Evaluation (Addendum)
Anesthesia Evaluation  Patient identified by MRN, date of birth, ID band Patient awake    Reviewed: Allergy & Precautions, NPO status , Patient's Chart, lab work & pertinent test results  Airway Mallampati: III  TM Distance: >3 FB Neck ROM: Full    Dental  (+) Dental Advisory Given, Teeth Intact   Pulmonary sleep apnea ,          Cardiovascular hypertension, Pt. on medications and Pt. on home beta blockers + CAD, + Past MI, + CABG and +CHF + dysrhythmias Atrial Fibrillation + Cardiac Defibrillator     Neuro/Psych  Neuromuscular disease    GI/Hepatic negative GI ROS, Neg liver ROS,   Endo/Other  diabetes, Type 2, Insulin Dependent, Oral Hypoglycemic AgentsMorbid obesity  Renal/GU CRFRenal disease     Musculoskeletal  (+) Arthritis -,   Abdominal   Peds  Hematology Plts 96   Anesthesia Other Findings   Reproductive/Obstetrics                           Anesthesia Physical Anesthesia Plan  ASA: III  Anesthesia Plan: General and Regional   Post-op Pain Management:    Induction: Intravenous  Airway Management Planned: Oral ETT  Additional Equipment:   Intra-op Plan:   Post-operative Plan: Extubation in OR  Informed Consent: I have reviewed the patients History and Physical, chart, labs and discussed the procedure including the risks, benefits and alternatives for the proposed anesthesia with the patient or authorized representative who has indicated his/her understanding and acceptance.   Dental advisory given  Plan Discussed with: CRNA, Anesthesiologist and Surgeon  Anesthesia Plan Comments:        Anesthesia Quick Evaluation

## 2014-11-29 NOTE — Anesthesia Procedure Notes (Addendum)
Anesthesia Regional Block:  Adductor canal block  Pre-Anesthetic Checklist: ,, timeout performed, Correct Patient, Correct Site, Correct Laterality, Correct Procedure, Correct Position, site marked, Risks and benefits discussed,  Surgical consent,  Pre-op evaluation,  At surgeon's request and post-op pain management  Laterality: Right  Prep: chloraprep       Needles:  Injection technique: Single-shot  Needle Type: Echogenic Needle     Needle Length: 9cm 9 cm Needle Gauge: 21 and 21 G    Additional Needles:  Procedures: ultrasound guided (picture in chart) Adductor canal block Narrative:  Start time: 11/29/2014 11:50 AM End time: 11/29/2014 11:56 AM Injection made incrementally with aspirations every 5 mL.  Performed by: Personally  Anesthesiologist: Suzette Battiest  Additional Notes: Risks and benefits explained. Pt tolerated procedure well with no immediate complications.   Procedure Name: Intubation Date/Time: 11/29/2014 12:22 PM Performed by: Garrison Columbus T Pre-anesthesia Checklist: Patient identified, Emergency Drugs available, Suction available and Patient being monitored Patient Re-evaluated:Patient Re-evaluated prior to inductionOxygen Delivery Method: Circle system utilized Preoxygenation: Pre-oxygenation with 100% oxygen Intubation Type: IV induction and Rapid sequence Ventilation: Mask ventilation without difficulty and Oral airway inserted - appropriate to patient size Laryngoscope Size: Sabra Heck and 2 Grade View: Grade II Tube type: Oral Tube size: 7.5 mm Number of attempts: 1 Airway Equipment and Method: Stylet and Oral airway Placement Confirmation: ETT inserted through vocal cords under direct vision,  positive ETCO2 and breath sounds checked- equal and bilateral Secured at: 22 cm Tube secured with: Tape Dental Injury: Teeth and Oropharynx as per pre-operative assessment

## 2014-11-30 LAB — CBC
HEMATOCRIT: 32.8 % — AB (ref 39.0–52.0)
Hemoglobin: 10.7 g/dL — ABNORMAL LOW (ref 13.0–17.0)
MCH: 30.5 pg (ref 26.0–34.0)
MCHC: 32.6 g/dL (ref 30.0–36.0)
MCV: 93.4 fL (ref 78.0–100.0)
Platelets: 81 10*3/uL — ABNORMAL LOW (ref 150–400)
RBC: 3.51 MIL/uL — AB (ref 4.22–5.81)
RDW: 15.6 % — ABNORMAL HIGH (ref 11.5–15.5)
WBC: 5.9 10*3/uL (ref 4.0–10.5)

## 2014-11-30 LAB — GLUCOSE, CAPILLARY
Glucose-Capillary: 221 mg/dL — ABNORMAL HIGH (ref 70–99)
Glucose-Capillary: 244 mg/dL — ABNORMAL HIGH (ref 70–99)
Glucose-Capillary: 276 mg/dL — ABNORMAL HIGH (ref 70–99)
Glucose-Capillary: 250 mg/dL — ABNORMAL HIGH (ref 70–99)

## 2014-11-30 LAB — BASIC METABOLIC PANEL
Anion gap: 9 (ref 5–15)
BUN: 29 mg/dL — ABNORMAL HIGH (ref 6–23)
CALCIUM: 9.1 mg/dL (ref 8.4–10.5)
CHLORIDE: 100 mmol/L (ref 96–112)
CO2: 27 mmol/L (ref 19–32)
Creatinine, Ser: 1.53 mg/dL — ABNORMAL HIGH (ref 0.50–1.35)
GFR, EST AFRICAN AMERICAN: 50 mL/min — AB (ref >90)
GFR, EST NON AFRICAN AMERICAN: 43 mL/min — AB (ref >90)
GLUCOSE: 237 mg/dL — AB (ref 70–99)
Potassium: 4.7 mmol/L (ref 3.5–5.1)
Sodium: 136 mmol/L (ref 135–145)

## 2014-11-30 MED ORDER — OXYCODONE HCL 5 MG PO TABS: 5.0000 mg | ORAL_TABLET | ORAL | Status: DC | PRN

## 2014-11-30 MED ORDER — OXYCODONE HCL ER 10 MG PO T12A: 10.0000 mg | EXTENDED_RELEASE_TABLET | Freq: Two times a day (BID) | ORAL | Status: DC

## 2014-11-30 MED ORDER — METHOCARBAMOL 750 MG PO TABS: 750.0000 mg | ORAL_TABLET | Freq: Two times a day (BID) | ORAL | Status: DC | PRN

## 2014-11-30 NOTE — Progress Notes (Signed)
Placed patient on CPAP set at 12cm with oxygen set at 2lpm

## 2014-11-30 NOTE — Progress Notes (Signed)
Orthopedic Tech Progress Note Patient Details:  Robert Gregory Sep 04, 1941 [id number] Patient already in CPM. CPM adjust for patient comfort.  CPM Right Knee CPM Right Knee: On Right Knee Flexion (Degrees): 75 Right Knee Extension (Degrees): 0   Asia R Thompson 11/30/2014, 4:05 PM

## 2014-11-30 NOTE — Progress Notes (Signed)
Orthopedic Tech Progress Note Patient Details:  Robert Gregory [birth year] [id number]  Ortho Devices Type of Ortho Device: Knee Immobilizer Ortho Device/Splint Location: RLE Ortho Device/Splint Interventions: Application   Robert Gregory 11/30/2014, 12:07 PM

## 2014-11-30 NOTE — Progress Notes (Signed)
Physical Therapy Treatment Patient Details Name: Robert Gregory MRN: [id number] DOB: [birth year] Today's Date: 11/30/2014    History of Present Illness Patient is a 74 yo male admitted 11/29/14 now s/p Rt TKA.  PMH:  CAD, MI, ICM, ICD placement, Afib, HTN, CABG, HLD, OSA, spinal stenosis, laminectomy, DM, peripheral neuropathy, CHF, arthritis, s/p Lt TKA, rotator cuff repair    PT Comments    Patient declined transfers and mobility this am as he had just gotten up to the chair with nursing upon therapy arrival.  Patient with decr Rt quad strength/control s/p TKA.  Patient may benefit from continued skilled PT to further progress mobility prior to discharge home with wife.  Follow Up Recommendations  Home health PT;Supervision/Assistance - 24 hour     Equipment Recommendations  None recommended by PT    Recommendations for Other Services       Precautions / Restrictions Precautions Precautions: Knee Precaution Booklet Issued: No Precaution Comments: WB status precautions, position precautions reviewed Required Braces or Orthoses: Knee Immobilizer - Right Knee Immobilizer - Right: On when out of bed or walking Restrictions Weight Bearing Restrictions: Yes RLE Weight Bearing: Weight bearing as tolerated    Mobility  Bed Mobility Overal bed mobility:  (OOB upon arrival)                Transfers Overall transfer level:  (patient declined)                  Ambulation/Gait Ambulation/Gait assistance:  (patient declined)               Stairs            Wheelchair Mobility    Modified Rankin (Stroke Patients Only)       Balance                                    Cognition Arousal/Alertness: Awake/alert Behavior During Therapy: WFL for tasks assessed/performed Overall Cognitive Status: Within Functional Limits for tasks assessed                      Exercises Total Joint Exercises Ankle Circles/Pumps: AROM;Both;10  reps;Seated Quad Sets: AROM;Right;Seated Towel Squeeze: AROM;Both;10 reps;Seated Short Arc Quad: AAROM;Right;10 reps;Seated Heel Slides: AAROM;Right;10 reps;Seated Hip ABduction/ADduction: AAROM;Right;10 reps;Seated Straight Leg Raises: AAROM;Right;10 reps;Seated Long Arc Quad: AROM;Right;10 reps;Seated Knee Flexion: AAROM;Right;10 reps;Seated Goniometric ROM: 70    General Comments        Pertinent Vitals/Pain Pain Assessment: 0-10 Pain Score: 7  Pain Location: Rt knee Pain Descriptors / Indicators: Aching;Sore;Constant Pain Intervention(s): Limited activity within patient's tolerance;Monitored during session;Repositioned    Home Living                      Prior Function            PT Goals (current goals can now be found in the care plan section) Acute Rehab PT Goals Patient Stated Goal: improve strength PT Goal Formulation: With patient/family Time For Goal Achievement: 12/06/14 Potential to Achieve Goals: Good Progress towards PT goals: Progressing toward goals    Frequency  7X/week    PT Plan Current plan remains appropriate    Co-evaluation             End of Session   Activity Tolerance: Patient limited by pain Patient left: in chair;with call bell/phone within reach;with family/visitor present  Time: 1112-1140 PT Time Calculation (min) (ACUTE ONLY): 28 min  Charges:  $Therapeutic Exercise: 23-37 mins                    G CodesMalka So, [place] [phone] Cheray Pardi 11/30/2014, 4:57 PM

## 2014-11-30 NOTE — Progress Notes (Signed)
PT Cancellation Note  Patient Details Name: Robert Gregory MRN: [id number] DOB: [birth year]   Cancelled Treatment:    Reason Eval/Treat Not Completed: Pain limiting ability to participate (patient reports sitting up in chair too long today) Assisted patient/wife in positioning in CPM to improve comfort.  Patient aware that he will need to mobilize in order to d/c home. Will recheck status tomorrow.  [place], PT [phone] [place] 11/30/2014, 5:04 PM

## 2014-11-30 NOTE — Evaluation (Signed)
Occupational Therapy Evaluation Patient Details Name: ZAEDYN COVIN MRN: [id number] DOB: 06-27-1941 Today's Date: 11/30/2014    History of Present Illness Patient is a 74 yo male admitted 11/29/14 now s/p Rt TKA.  PMH:  CAD, MI, ICM, ICD placement, Afib, HTN, CABG, HLD, OSA, spinal stenosis, laminectomy, DM, peripheral neuropathy, CHF, arthritis, s/p Lt TKA, rotator cuff repair   Clinical Impression   Pt s/p above. Pt independent with ADLs, PTA. Feel pt will benefit from acute OT to increase independence with BADLs prior to d/c.      Follow Up Recommendations  No OT follow up;Supervision - Intermittent (OOB/mobility)   Equipment Recommendations  Other (comment) (bariatric bedside commode; AE)    Recommendations for Other Services       Precautions / Restrictions Precautions Precautions: Knee Precaution Booklet Issued: No Precaution Comments: educated on knee precautions Restrictions Weight Bearing Restrictions: Yes RLE Weight Bearing: Weight bearing as tolerated      Mobility Bed Mobility Overal bed mobility: Needs Assistance Bed Mobility: Supine to Sit     Supine to sit: Min assist     General bed mobility comments: assist with RLE. Suggested using blanket/sheet or hooking other leg behind Rt ankle to assist RLE.  Transfers Overall transfer level: Needs assistance Equipment used: Rolling walker (2 wheeled) Transfers: Sit to/from Stand Sit to Stand: Min guard;Min assist         General transfer comment: cues for technique. Assist to try to control descent when going to sit in chair.    Balance    Min guard for ambulation with RW.                                        ADL Overall ADL's : Needs assistance/impaired                 Upper Body Dressing : Set up;Sitting   Lower Body Dressing: Moderate assistance;With adaptive equipment;Sit to/from stand   Toilet Transfer: Min guard;Ambulation;RW (chair/bed)            Functional mobility during ADLs: Min guard;Rolling walker General ADL Comments: Educated on AE/cost/where he could purchase and LB dressing technique. Pt practiced with sockaid. Educated on safety such as safe shoewear, use of bag on walker, sitting for most of LB ADLs. Educated on options for shower chair and tub and shower transfer techniques.  Explained benefit of reaching down to Rt foot for LB dressing as it allows knee to bend.     Vision     Perception     Praxis      Pertinent Vitals/Pain Pain Assessment: 0-10 Pain Score:  (5-6) Pain Location: Rt knee Pain Descriptors / Indicators: Dull Pain Intervention(s): Monitored during session;Repositioned     Hand Dominance     Extremity/Trunk Assessment Upper Extremity Assessment Upper Extremity Assessment: Overall WFL for tasks assessed   Lower Extremity Assessment Lower Extremity Assessment: Defer to PT evaluation       Communication Communication Communication: No difficulties   Cognition Arousal/Alertness: Awake/alert Behavior During Therapy: WFL for tasks assessed/performed Overall Cognitive Status: Within Functional Limits for tasks assessed                     General Comments       Exercises       Shoulder Instructions      Home Living Family/patient expects to be discharged to::  Private residence Living Arrangements: Spouse/significant other Available Help at Discharge: Family;Available 24 hours/day Type of Home: House Home Access: Level entry     Home Layout: One level     Bathroom Shower/Tub: Tub/shower unit;Walk-in shower (door on walk-in shower)   Bathroom Toilet: Standard (sink close and has toilet riser)     Home Equipment: Walker - 2 wheels;Toilet riser;Cane - single point;Grab bars - toilet;Grab bars - tub/shower;Adaptive equipment;Shower seat - built in [employer] Equipment: Reacher;Long-handled Conservation officer, historic buildings        Prior Functioning/Environment Level of Independence:  Independent with assistive device(s)        Comments: Used cane for longer distances due to knee pain    OT Diagnosis: Acute pain   OT Problem List: Decreased strength;Decreased range of motion;Decreased activity tolerance;Decreased knowledge of use of DME or AE;Decreased knowledge of precautions;Pain;Obesity;Increased edema   OT Treatment/Interventions: Self-care/ADL training;DME and/or AE instruction;Therapeutic activities;Patient/family education;Balance training    OT Goals(Current goals can be found in the care plan section) Acute Rehab OT Goals Patient Stated Goal: get back to walking OT Goal Formulation: With patient Time For Goal Achievement: 12/07/14 Potential to Achieve Goals: Good ADL Goals Pt Will Perform Lower Body Dressing: with adaptive equipment;sit to/from stand;with set-up Pt Will Transfer to Toilet: with modified independence;ambulating;grab bars (elevated toilet) Pt Will Perform Toileting - Clothing Manipulation and hygiene: with modified independence;sit to/from stand Pt Will Perform Tub/Shower Transfer: Shower transfer;Tub transfer;with supervision;3 in 1;rolling walker (3 in 1 versus shower chair)  OT Frequency: Min 2X/week   Barriers to D/C:            Co-evaluation              End of Session Equipment Utilized During Treatment: Gait belt;Rolling walker CPM Right Knee CPM Right Knee: Off Nurse Communication: Mobility status  Activity Tolerance: Patient tolerated treatment well Patient left: in chair;with call bell/phone within reach   Time: 1019-1037 OT Time Calculation (min): 18 min Charges:  OT General Charges $OT Visit: 1 Procedure OT Evaluation $Initial OT Evaluation Tier I: 1 Procedure G-CodesBenito Mccreedy OTR/L [phone] 11/30/2014, 10:52 AM

## 2014-11-30 NOTE — Progress Notes (Signed)
Subjective: 1 Day Post-Op Procedure(s) (LRB): RIGHT TOTAL KNEE ARTHROPLASTY (Right) Patient reports pain as moderate.  No chest pain , SOB, or calf pain. Voiding well tolerating diet .  Objective: Vital signs in last 24 hours: Temp:  [97.7 F (36.5 C)-98.6 F (37 C)] 97.7 F (36.5 C) (03/12 0543) Pulse Rate:  [78-106] 82 (03/12 0543) Resp:  [14-22] 16 (03/11 1638) BP: (113-144)/(57-87) 116/57 mmHg (03/12 0543) SpO2:  [92 %-100 %] 100 % (03/12 0543) Weight:  [154.042 kg (339 lb 9.6 oz)] 154.042 kg (339 lb 9.6 oz) (03/11 1028)  Intake/Output from previous day: 03/11 0701 - 03/12 0700 In: 1300 [I.V.:1300] Out: 2350 [Urine:1750; Drains:500; Blood:100] Intake/Output this shift:     Recent Labs  11/30/14 0630  HGB 10.7*    Recent Labs  11/29/14 1038 11/30/14 0630  WBC  --  5.9  RBC  --  3.51*  HCT  --  32.8*  PLT 84* 81*    Recent Labs  11/30/14 0630  NA 136  K 4.7  CL 100  CO2 27  BUN 29*  CREATININE 1.53*  GLUCOSE 237*  CALCIUM 9.1    Recent Labs  11/29/14 1038  INR 1.11   Right leg: Sensation intact distally Intact pulses distally Dorsiflexion/Plantar flexion intact Incision: dressing C/D/I Compartment soft  Assessment/Plan: 1 Day Post-Op Procedure(s) (LRB): RIGHT TOTAL KNEE ARTHROPLASTY (Right) Up with therapy  Hemovac pulled . Monitor labs and urine output  Robert Gregory 11/30/2014, 9:21 AM

## 2014-11-30 NOTE — Clinical Social Work Note (Signed)
CSW received consult for SNF. CSW reviewed patient's chart and OT recommendation for no OT follow-up and PT recommendation for HHPT. CSW met with patient and wife who was present at bedside. CSW introduced self. Per wife, "we've been through this before." Patient states he has "all the stuff at home to help me, in the bathroom with rails, and all that stuff." Per wife, they spoke with [facility] and the agency will follow-up with them. Patient and wife decline SNF at this time and state patient can be assisted and supervised at home by wife. No further needs. CSW signing off.  [place], [place] Weekend Clinical Social Worker [phone]

## 2014-11-30 NOTE — Care Management Note (Addendum)
CARE MANAGEMENT NOTE 11/30/2014  Patient:  Robert Gregory, Robert Gregory   Account Number:  [id number]  Date Initiated:  11/30/2014  Documentation initiated by:  Oliveras-Aizpurua,Reiss Mowrey  Subjective/Objective Assessment:   74 yo M underwent R TKA.     Action/Plan:   PT is recommending 24 hr supervision and HHPT.   Anticipated DC Date:  12/02/2014   Anticipated DC Plan:  [place]  CM consult      [facility] Choice  HOME HEALTH   Choice offered to / List presented to:  C-1 Patient        [place] arranged  HH-2 PT      [place].   Status of service:  In process, will continue to follow Medicare Important Message given?   (If response is "NO", the following Medicare IM given date fields will be blank) Date Medicare IM given:   Medicare IM given by:   Date Additional Medicare IM given:  11/30/2014 Additional Medicare IM given by:  Norina Buzzard  Discharge Disposition:    Per UR Regulation:    If discussed at Long Length of Stay Meetings, dates discussed:    Comments:  11/29/13 0900 Frann Rider, RN, BSN  Met with pt to discuss d/c plan and PT recommendations. He plans to return home with the support of his wife 24 hr who is retired. He has a walker. He doesn't have a preference for a [place] agency. He agreed to use [facility] for HHPT. Contacted Kedy at [facility] for referral. RN made aware that pt needs a face to face.

## 2014-12-01 LAB — BASIC METABOLIC PANEL
Anion gap: 10 (ref 5–15)
BUN: 19 mg/dL (ref 6–23)
CALCIUM: 8.9 mg/dL (ref 8.4–10.5)
CO2: 21 mmol/L (ref 19–32)
CREATININE: 1.16 mg/dL (ref 0.50–1.35)
Chloride: 101 mmol/L (ref 96–112)
GFR calc Af Amer: 70 mL/min — ABNORMAL LOW (ref >90)
GFR calc non Af Amer: 61 mL/min — ABNORMAL LOW (ref >90)
Glucose, Bld: 262 mg/dL — ABNORMAL HIGH (ref 70–99)
Potassium: 4.7 mmol/L (ref 3.5–5.1)
Sodium: 132 mmol/L — ABNORMAL LOW (ref 135–145)

## 2014-12-01 LAB — GLUCOSE, CAPILLARY
GLUCOSE-CAPILLARY: 240 mg/dL — AB (ref 70–99)
GLUCOSE-CAPILLARY: 252 mg/dL — AB (ref 70–99)
GLUCOSE-CAPILLARY: 281 mg/dL — AB (ref 70–99)
Glucose-Capillary: 296 mg/dL — ABNORMAL HIGH (ref 70–99)

## 2014-12-01 LAB — CBC
HCT: 32.8 % — ABNORMAL LOW (ref 39.0–52.0)
HEMOGLOBIN: 11.2 g/dL — AB (ref 13.0–17.0)
MCH: 30.7 pg (ref 26.0–34.0)
MCHC: 34.1 g/dL (ref 30.0–36.0)
MCV: 89.9 fL (ref 78.0–100.0)
Platelets: 78 10*3/uL — ABNORMAL LOW (ref 150–400)
RBC: 3.65 MIL/uL — AB (ref 4.22–5.81)
RDW: 15.8 % — AB (ref 11.5–15.5)
WBC: 7.6 10*3/uL (ref 4.0–10.5)

## 2014-12-01 MED ORDER — INSULIN ASPART 100 UNIT/ML ~~LOC~~ SOLN
0.0000 [IU] | Freq: Every day | SUBCUTANEOUS | Status: DC
  Administered 2014-12-01: 3 [IU] via SUBCUTANEOUS
  Administered 2014-12-02: 2 [IU] via SUBCUTANEOUS

## 2014-12-01 MED ORDER — INSULIN ASPART 100 UNIT/ML ~~LOC~~ SOLN
0.0000 [IU] | Freq: Three times a day (TID) | SUBCUTANEOUS | Status: DC
  Administered 2014-12-01 – 2014-12-02 (×3): 11 [IU] via SUBCUTANEOUS
  Administered 2014-12-02 – 2014-12-03 (×3): 4 [IU] via SUBCUTANEOUS
  Administered 2014-12-03: 11 [IU] via SUBCUTANEOUS

## 2014-12-01 MED ORDER — INSULIN ASPART 100 UNIT/ML ~~LOC~~ SOLN
6.0000 [IU] | Freq: Three times a day (TID) | SUBCUTANEOUS | Status: DC
  Administered 2014-12-01: 6 [IU] via SUBCUTANEOUS
  Administered 2014-12-01: 100 [IU] via SUBCUTANEOUS
  Administered 2014-12-02 – 2014-12-03 (×4): 6 [IU] via SUBCUTANEOUS

## 2014-12-01 NOTE — Progress Notes (Signed)
Physical Therapy Treatment Patient Details Name: Robert Gregory MRN: [id number] DOB: 21-Nov-1940 Today's Date: 12/01/2014    History of Present Illness Patient is a 74 yo male admitted 11/29/14 now s/p Rt TKA.  PMH:  CAD, MI, ICM, ICD placement, Afib, HTN, CABG, HLD, OSA, spinal stenosis, laminectomy, DM, peripheral neuropathy, CHF, arthritis, s/p Lt TKA, rotator cuff repair    PT Comments    Making progress with mobility, able to walk today in the hallway; pain and muscle guarding limiting knee flexion; Will need to make significantly more progress with activity tolerance in order to dc home; If he progresses well, tomorrow is not unreasonable; Pt and wife would likely be more comfortable with dc Tues   Follow Up Recommendations  Home health PT;Supervision/Assistance - 24 hour     Equipment Recommendations  None recommended by PT    Recommendations for Other Services       Precautions / Restrictions Precautions Precautions: Knee Precaution Comments: Pt educated to not allow any pillow or bolster under knee for healing with optimal range of motion.  Required Braces or Orthoses: Knee Immobilizer - Right Knee Immobilizer - Right: On when out of bed or walking Restrictions RLE Weight Bearing: Weight bearing as tolerated    Mobility  Bed Mobility Overal bed mobility: Needs Assistance Bed Mobility: Supine to Sit     Supine to sit: Min assist     General bed mobility comments: Cues for technique and sequencing; assist to support RLE as it cleared EOB  Transfers Overall transfer level: Needs assistance Equipment used: Rolling walker (2 wheeled) Transfers: Sit to/from Stand Sit to Stand: Min guard;Min assist         General transfer comment: Cues for safety, hand placement and technqiue  Ambulation/Gait Ambulation/Gait assistance: Min assist Ambulation Distance (Feet): 75 Feet Assistive device: Rolling walker (2 wheeled) Gait Pattern/deviations: Step-to pattern  (emerging step-through)     General Gait Details: Cues for sequence and to activate quad for stance stability; cues also to self-monitor for activity tolerance   Stairs            Wheelchair Mobility    Modified Rankin (Stroke Patients Only)       Balance                                    Cognition Arousal/Alertness: Awake/alert Behavior During Therapy: WFL for tasks assessed/performed Overall Cognitive Status: Within Functional Limits for tasks assessed                      Exercises Total Joint Exercises Quad Sets: AROM;Right;10 reps Heel Slides: AAROM;Right;10 reps Straight Leg Raises: AAROM;Right;10 reps Goniometric ROM: 0-60 approx; heavy muscle guarding    General Comments        Pertinent Vitals/Pain Pain Assessment: 0-10 Pain Score: 7  Pain Location: R knee Pain Descriptors / Indicators: Aching;Guarding Pain Intervention(s): Limited activity within patient's tolerance;Premedicated before session    Home Living                      Prior Function            PT Goals (current goals can now be found in the care plan section) Acute Rehab PT Goals Patient Stated Goal: improve strength PT Goal Formulation: With patient/family Time For Goal Achievement: 12/06/14 Potential to Achieve Goals: Good Progress towards PT goals: Progressing toward goals  Frequency  7X/week    PT Plan Current plan remains appropriate    Co-evaluation             End of Session Equipment Utilized During Treatment: Gait belt;Right knee immobilizer Activity Tolerance: Patient tolerated treatment well Patient left: in chair;with call bell/phone within reach;with family/visitor present     Time: 5927-6394 PT Time Calculation (min) (ACUTE ONLY): 38 min  Charges:  $Gait Training: 8-22 mins $Therapeutic Exercise: 8-22 mins $Therapeutic Activity: 8-22 mins                    G Codes:      [name] 12/01/2014, 1:19  PM  [name], [place] Pager [phone] Office [phone]

## 2014-12-01 NOTE — Progress Notes (Signed)
Pt states he will place himself on CPAP when ready. H2O filled. I told him to call for further assistance

## 2014-12-01 NOTE — Progress Notes (Signed)
Subjective: 2 Days Post-Op Procedure(s) (LRB): RIGHT TOTAL KNEE ARTHROPLASTY (Right) Patient reports pain as moderate.  Otherwise no complaints.  Objective: Vital signs in last 24 hours: Temp:  [97.6 F (36.4 C)-98.8 F (37.1 C)] 98.2 F (36.8 C) (03/13 0457) Pulse Rate:  [76-88] 86 (03/13 0457) Resp:  [15-18] 16 (03/13 0457) BP: (118-135)/(62-67) 135/62 mmHg (03/13 0457) SpO2:  [91 %-100 %] 91 % (03/13 0457)  Intake/Output from previous day: 03/12 0701 - 03/13 0700 In: 720 [P.O.:720] Out: 1000 [Urine:1000] Intake/Output this shift:     Recent Labs  11/30/14 0630  HGB 10.7*    Recent Labs  11/29/14 1038 11/30/14 0630  WBC  --  5.9  RBC  --  3.51*  HCT  --  32.8*  PLT 84* 81*    Recent Labs  11/30/14 0630 12/01/14 0554  NA 136 132*  K 4.7 4.7  CL 100 101  CO2 27 21  BUN 29* 19  CREATININE 1.53* 1.16  GLUCOSE 237* 262*  CALCIUM 9.1 8.9    Recent Labs  11/29/14 1038  INR 1.11    Sensation intact distally Intact pulses distally Dorsiflexion/Plantar flexion intact Incision: moderate drainage Compartment soft  Assessment/Plan: 2 Days Post-Op Procedure(s) (LRB): RIGHT TOTAL KNEE ARTHROPLASTY (Right) Up with therapy  Robert Gregory 12/01/2014, 9:32 AM

## 2014-12-02 LAB — CBC
HCT: 32.6 % — ABNORMAL LOW (ref 39.0–52.0)
Hemoglobin: 10.7 g/dL — ABNORMAL LOW (ref 13.0–17.0)
MCH: 30.1 pg (ref 26.0–34.0)
MCHC: 32.8 g/dL (ref 30.0–36.0)
MCV: 91.8 fL (ref 78.0–100.0)
Platelets: 116 10*3/uL — ABNORMAL LOW (ref 150–400)
RBC: 3.55 MIL/uL — ABNORMAL LOW (ref 4.22–5.81)
RDW: 16 % — AB (ref 11.5–15.5)
WBC: 8.6 10*3/uL (ref 4.0–10.5)

## 2014-12-02 LAB — GLUCOSE, CAPILLARY
GLUCOSE-CAPILLARY: 214 mg/dL — AB (ref 70–99)
Glucose-Capillary: 180 mg/dL — ABNORMAL HIGH (ref 70–99)
Glucose-Capillary: 284 mg/dL — ABNORMAL HIGH (ref 70–99)
Glucose-Capillary: 246 mg/dL — ABNORMAL HIGH (ref 70–99)

## 2014-12-02 LAB — HEMOGLOBIN A1C
Hgb A1c MFr Bld: 7.4 % — ABNORMAL HIGH (ref 4.8–5.6)
Mean Plasma Glucose: 166 mg/dL

## 2014-12-02 MED ORDER — INSULIN GLARGINE 100 UNIT/ML ~~LOC~~ SOLN
35.0000 [IU] | Freq: Every day | SUBCUTANEOUS | Status: DC
  Administered 2014-12-02 – 2014-12-03 (×2): 35 [IU] via SUBCUTANEOUS
  Filled 2014-12-02 (×2): qty 0.35

## 2014-12-02 NOTE — Progress Notes (Signed)
Physical Therapy Treatment Patient Details Name: Robert Gregory MRN: [id number] DOB: [birth year] Today's Date: 12/02/2014    History of Present Illness Patient is a 74 yo male admitted 11/29/14 now s/p Rt TKA.  PMH:  CAD, MI, ICM, ICD placement, Afib, HTN, CABG, HLD, OSA, spinal stenosis, laminectomy, DM, peripheral neuropathy, CHF, arthritis, s/p Lt TKA, rotator cuff repair    PT Comments    Had  Discussed pt performance during OT session earlier with Robert Gregory, who had significant concern re: Robert Gregory safety with mobility and ADLs; He performed much better this session, and we think his difficulty with earlier session can be attributed to perhaps teh muscle relaxer medication he had taken;   He is progressing with amb and activity tolerance; needs continued work on transfers and bed mobility   Follow Up Recommendations  Home health PT;Supervision/Assistance - 24 hour     Equipment Recommendations  None recommended by PT    Recommendations for Other Services       Precautions / Restrictions Precautions Precautions: Knee Precaution Comments: Pt educated to not allow any pillow or bolster under knee for healing with optimal range of motion; Muscle relaxer meds apparently make pt quite weak and "loopy" (to quote pt) Required Braces or Orthoses: Knee Immobilizer - Right Knee Immobilizer - Right: On when out of bed or walking Restrictions RLE Weight Bearing: Weight bearing as tolerated    Mobility  Bed Mobility Overal bed mobility: Needs Assistance Bed Mobility: Supine to Sit     Supine to sit: Mod assist Sit to supine: Max assist   General bed mobility comments: Cues for technique and sequencing; assist to support RLE as it cleared EOB; noted difficulty keeping weight forward in sitting, likely due to body habitus  Transfers Overall transfer level: Needs assistance Equipment used: Rolling walker (2 wheeled) Transfers: Sit to/from Stand Sit to Stand: Mod assist          General transfer comment: Cues for safety, hand placement and technqiue; mod assist with weight shift forward over feet  Ambulation/Gait Ambulation/Gait assistance: +2 safety/equipment;Min assist Ambulation Distance (Feet): 110 Feet Assistive device: Rolling walker (2 wheeled) Gait Pattern/deviations: Step-to pattern;Step-through pattern     General Gait Details: Cues for sequence and to activate quad for stance stability; cues also to self-monitor for activity tolerance, and relax shoulders; 3 standing rest breaks   Stairs            Wheelchair Mobility    Modified Rankin (Stroke Patients Only)       Balance                                    Cognition Arousal/Alertness: Awake/alert Behavior During Therapy: WFL for tasks assessed/performed Overall Cognitive Status: Within Functional Limits for tasks assessed                      Exercises      General Comments        Pertinent Vitals/Pain Pain Assessment: 0-10 Pain Score: 6  Pain Location: R knee psot amb Pain Descriptors / Indicators: Aching;Grimacing Pain Intervention(s): Limited activity within patient's tolerance;Monitored during session;RN gave pain meds during session    Home Living                      Prior Function            PT  Goals (current goals can now be found in the care plan section) Acute Rehab PT Goals Patient Stated Goal: improve strength PT Goal Formulation: With patient/family Time For Goal Achievement: 12/06/14 Potential to Achieve Goals: Good Progress towards PT goals: Progressing toward goals    Frequency  7X/week    PT Plan Current plan remains appropriate    Co-evaluation             End of Session Equipment Utilized During Treatment: Gait belt;Right knee immobilizer Activity Tolerance: Patient tolerated treatment well Patient left: in chair;with call bell/phone within reach;with family/visitor present     Time:  3403-5248 PT Time Calculation (min) (ACUTE ONLY): 35 min  Charges:  $Gait Training: 8-22 mins $Therapeutic Activity: 8-22 mins                    G Codes:      Robert Gregory 12/02/2014, 11:20 AM  Robert Gregory, Robert Gregory Pager [phone] Office [phone]

## 2014-12-02 NOTE — Progress Notes (Signed)
CARE MANAGEMENT NOTE 12/02/2014  Patient:  Robert Gregory, Robert Gregory   Account Number:  [id number]  Date Initiated:  11/30/2014  Documentation initiated by:  Oliveras-Aizpurua,Jeannette  Subjective/Objective Assessment:   74 yo M underwent R TKA.     Action/Plan:   PT is recommending 24 hr supervision and HHPT.   Anticipated DC Date:  12/03/2014   Anticipated DC Plan:  [place]  CM consult      [facility] Choice  HOME HEALTH   Choice offered to / List presented to:  C-1 Patient        [facility] arranged  HH-2 PT      [place]   Status of service:  Completed, signed off Medicare Important Message given?  YES Date Medicare IM given:  12/02/2014 Medicare IM given by:  [facility]  Discharge Disposition:  [place]  Per UR Regulation:  Reviewed for med. necessity/level of care/duration of stay    Comments:  12/02/14 Set up with [facility] for HHPT by MD office prior to surgery. Patient wants to work with [facility]. Contacted Zohaib Heeney with [facility] and confirmed that patient is still set up with HHPT and informed her that anticipated d/c date is 12/03/14. Contacted Miranda at [facility] and cancelled [place] with them. Patient's wife stated that patient has a rolling walker and a 3N1 at home. No equipment needs identified.Will continue to follow  until discharge.  11/29/13 0900 Frann Rider, RN, BSN  Met with pt to discuss d/c plan and PT recommendations. He plans to return home with the support of his wife 24 hr who is retired. He has a walker. He doesn't have a preference for a [place] agency. He agreed to use [facility] for HHPT. Contacted Kedy at [facility] for referral. RN made aware that pt needs a face to face.

## 2014-12-02 NOTE — Progress Notes (Signed)
Orthopedic Tech Progress Note Patient Details:  Robert Gregory 1940/11/23 [id number] On cpm at 8:50 pm Patient ID: Robert Gregory, male   DOB: 07/25/1941, 74 y.o.   MRN: [id number]   [name] 12/02/2014, 8:51 PM

## 2014-12-02 NOTE — Anesthesia Postprocedure Evaluation (Signed)
  Anesthesia Post-op Note  Patient: Robert Gregory  Procedure(s) Performed: Procedure(s): RIGHT TOTAL KNEE ARTHROPLASTY (Right)  Patient Location: PACU  Anesthesia Type:GA combined with regional for post-op pain  Level of Consciousness: awake and alert   Airway and Oxygen Therapy: Patient Spontanous Breathing  Post-op Pain: none  Post-op Assessment: Post-op Vital signs reviewed  Post-op Vital Signs: Reviewed  Last Vitals:  Filed Vitals:   12/02/14 0607  BP: 115/55  Pulse: 81  Temp: 37.1 C  Resp: 18    Complications: No apparent anesthesia complications

## 2014-12-02 NOTE — Progress Notes (Signed)
   Subjective:  Patient reports pain as moderate.    Objective:   VITALS:   Filed Vitals:   11/30/14 2347 12/01/14 0457 12/01/14 2004 12/02/14 0607  BP:  135/62 129/55 115/55  Pulse: 88 86 87 81  Temp:  98.2 F (36.8 C) 98.9 F (37.2 C) 98.8 F (37.1 C)  TempSrc:  Oral Oral Oral  Resp: 15 16 16 18   Height:      Weight:      SpO2: 95% 91% 94% 98%    BS are improved Dressing c/d/i   Lab Results  Component Value Date   WBC 7.6 12/01/2014   HGB 11.2* 12/01/2014   HCT 32.8* 12/01/2014   MCV 89.9 12/01/2014   PLT 78* 12/01/2014     Assessment/Plan:  3 Days Post-Op   - Expected postop acute blood loss anemia - will monitor for symptoms - Up with PT/OT - DVT ppx - SCDs, ambulation, xarelto - WBAT right lower extremity - Pain control - Discharge planning - Tues vs. Wed - BS improved - Heart/Carb modified diet  Marianna Payment 12/02/2014, 7:37 AM [phone]

## 2014-12-02 NOTE — Progress Notes (Signed)
Occupational Therapy Treatment Patient Details Name: Robert Gregory MRN: [id number] DOB: 09-Nov-1940 Today's Date: 12/02/2014    History of present illness Patient is a 74 yo male admitted 11/29/14 now s/p Rt TKA.  PMH:  CAD, MI, ICM, ICD placement, Afib, HTN, CABG, HLD, OSA, spinal stenosis, laminectomy, DM, peripheral neuropathy, CHF, arthritis, s/p Lt TKA, rotator cuff repair   OT comments  Pt. Eager to participate but very limited today with attempts at mobility.  Required max a for supine to sit.  Unable to power into standing with multiple attempts. Wife reports it took 2 nursing staff members to A pt. With to/from and on/off toilet this a.m. Pt. States that he notices "it just isn't there is it" in reference to his inability to complete any mobility with out max a.  Updated PT that is scheduled to see him today of noted difference in mobility from yesterday to today.  If this remains d/c plan may need to be updated as wife would not be able to provide the physical assist currently needed.    Follow Up Recommendations  SNF;Home health OT , if pt. Unable to demonstrate consistent progress with physical mobility d/c recommendations will need to be updated.  Also WIFE REPORTS TWO DIFFERENT HH AGENCIES HAVE CONTACTED HER FOR HH NEEDS, wants that addressed and "cleared up".     Equipment Recommendations       Recommendations for Other Services      Precautions / Restrictions Precautions Precautions: Knee Required Braces or Orthoses: Knee Immobilizer - Right Knee Immobilizer - Right: On when out of bed or walking Restrictions RLE Weight Bearing: Weight bearing as tolerated       Mobility Bed Mobility Overal bed mobility: Needs Assistance Bed Mobility: Supine to Sit;Sit to Supine     Supine to sit: Max assist Sit to supine: Max assist   General bed mobility comments: hob flat, no rails, exit out of R side per report of home set up.  pt. unable to transition into upright long sitting  without max a, min a to bring b les off of bed  Transfers Overall transfer level: Needs assistance Equipment used: Rolling walker (2 wheeled) Transfers: Sit to/from Stand Sit to Stand: Max assist;Total assist         General transfer comment: attemtped sit/stand x4, pt. continued to scoot hips to eob with unsafe portion of body off of bed, max a to scoot back onto bed prior to attempting sit/stand, pt. relying heavily on use of rw to try and power up and was still unable to transition into standing.  attempted various strategies, ie: both hands on the bed, one hand on bed, and one hand on walker, even both hands on walker (pt. and spouse report that is how he always does it and that PT allowed that during previous sessions).  pt. still unable to bring buttocks off of bed and  continued to scoot hips closer and closer off edge of bed, reviewed at length that this was a major risk to slide off edge of bed.  ultimately had to assitst pt. back to bed.  wife encouraged trapeze bar, explained we need continued efforts without additonal equipment if pt. still wants home, able to scoot up in bed with mod a.  wife and pt. then reported it took 2 cnas to get pt. to/from b.room this a.m.    Balance  ADL Overall ADL's : Needs assistance/impaired                                 Tub/ Shower Transfer: Walk-in shower;Grab bars;Shower Scientist, research (medical) Details (indicate cue type and reason): pt. and spouse present for explanation and demonstration of shower stall transfer, report they have a built in shower seat but it is too low, discussed possible use of 3n1 if needed.     General ADL Comments: provided demonstration of ant/post. shower stall transfer, attempted to practice this session but pt. unable (refer to mobility portion of chart notes)      Vision                     Perception     Praxis      Cognition    Behavior During Therapy: [facility] for tasks assessed/performed Overall Cognitive Status: Within Functional Limits for tasks assessed                       Extremity/Trunk Assessment               Exercises     Shoulder Instructions       General Comments      Pertinent Vitals/ Pain       Pain Assessment: No/denies pain  Home Living                                          Prior Functioning/Environment              Frequency Min 2X/week     Progress Toward Goals  OT Goals(current goals can now be found in the care plan section)  Progress towards OT goals: Not progressing toward goals - comment (noted difference in yesterdays therapy notes vs. today)     Plan Discharge plan remains appropriate;Discharge plan needs to be updated    Co-evaluation                 End of Session Equipment Utilized During Treatment: Rolling walker;Gait belt   Activity Tolerance Other (comment) (limited by generalized physical weakness)   Patient Left in bed;with call bell/phone within reach;with family/visitor present   Nurse Communication          Time: 9150-5697 OT Time Calculation (min): 24 min  Charges: OT General Charges $OT Visit: 1 Procedure OT Treatments $Self Care/Home Management : 23-37 mins  Janice Coffin, COTA/L 12/02/2014, 9:32 AM

## 2014-12-02 NOTE — Progress Notes (Signed)
Physical Therapy Treatment Patient Details Name: Robert Gregory MRN: [id number] DOB: [birth year] Today's Date: 12/02/2014    History of Present Illness Patient is a 74 yo male admitted 11/29/14 now s/p Rt TKA.  PMH:  CAD, MI, ICM, ICD placement, Afib, HTN, CABG, HLD, OSA, spinal stenosis, laminectomy, DM, peripheral neuropathy, CHF, arthritis, s/p Lt TKA, rotator cuff repair    PT Comments    Making steady progress with functional mobility; Needs continuing work on bed mobility and transfers  Follow Up Recommendations  Home health PT;Supervision/Assistance - 24 hour     Equipment Recommendations  None recommended by PT    Recommendations for Other Services       Precautions / Restrictions Precautions Precautions: Knee Precaution Comments: Pt educated to not allow any pillow or bolster under knee for healing with optimal range of motion; Muscle relaxer meds apparently make pt quite weak and "loopy" (to quote pt) Required Braces or Orthoses: Knee Immobilizer - Right Knee Immobilizer - Right: On when out of bed or walking Restrictions RLE Weight Bearing: Weight bearing as tolerated    Mobility  Bed Mobility Overal bed mobility: Needs Assistance Bed Mobility: Supine to Sit;Sit to Supine     Supine to sit: Min guard Sit to supine: Min assist   General bed mobility comments: Cues for technique and sequencing; noted difficulty keeping weight forward in sitting, likely due to body habitus, but not needing phsyical assist to get up to EOB; Pt exhausted for return to supine, and while he did not need as much assist, he required a lot of re-adjustment of position once in bed  Transfers Overall transfer level: Needs assistance Equipment used: Rolling walker (2 wheeled) Transfers: Sit to/from Stand Sit to Stand: Mod assist         General transfer comment: Cues for safety, hand placement and technqiue; lighter mod assist with weight shift forward over  feet  Ambulation/Gait Ambulation/Gait assistance: Min guard Ambulation Distance (Feet): 110 Feet (x2 with a seated rest break) Assistive device: Rolling walker (2 wheeled) Gait Pattern/deviations: Step-through pattern     General Gait Details: Cues for sequence and to activate quad for stance stability; cues also to self-monitor for activity tolerance, and relax shoulders; one seated rest break   Stairs            Wheelchair Mobility    Modified Rankin (Stroke Patients Only)       Balance                                    Cognition Arousal/Alertness: Awake/alert Behavior During Therapy: WFL for tasks assessed/performed Overall Cognitive Status: Within Functional Limits for tasks assessed                      Exercises Total Joint Exercises Quad Sets: AROM;Right;10 reps Towel Squeeze: AROM;Both;10 reps Short Arc QuadSinclair Ship;Right;10 reps Heel Slides: AAROM;Right;10 reps Hip ABduction/ADduction: AAROM;Right;10 reps Straight Leg Raises: AAROM;Right;10 reps Goniometric ROM: 0-70    General Comments        Pertinent Vitals/Pain Pain Assessment: 0-10 Pain Score: 7  Pain Location: R knee with SLR Pain Descriptors / Indicators: Aching;Grimacing Pain Intervention(s): Limited activity within patient's tolerance;Monitored during session;Patient requesting pain meds-RN notified    Home Living                      Prior Function  PT Goals (current goals can now be found in the care plan section) Acute Rehab PT Goals Patient Stated Goal: improve strength PT Goal Formulation: With patient/family Time For Goal Achievement: 12/06/14 Potential to Achieve Goals: Good Progress towards PT goals: Progressing toward goals    Frequency  7X/week    PT Plan Current plan remains appropriate    Co-evaluation             End of Session Equipment Utilized During Treatment: Gait belt;Right knee immobilizer Activity  Tolerance: Patient tolerated treatment well Patient left: in chair;in CPM;with call bell/phone within reach     Time: 1356-1450 PT Time Calculation (min) (ACUTE ONLY): 54 min  Charges:  $Gait Training: 8-22 mins $Therapeutic Exercise: 23-37 mins $Therapeutic Activity: 8-22 mins                    G Codes:      [name] 12/02/2014, 4:21 PM  [name], [name] Pager [phone] Office [phone]

## 2014-12-02 NOTE — Progress Notes (Signed)
Inpatient Diabetes Program Recommendations  AACE/ADA: New Consensus Statement on Inpatient Glycemic Control (2013)  Target Ranges:  Prepandial:   less than 140 mg/dL      Peak postprandial:   less than 180 mg/dL (1-2 hours)      Critically ill patients:  140 - 180 mg/dL   Reason for Assessment:  Results for ELEK, HOLDERNESS (MRN [id number]) as of 12/02/2014 13:23  Ref. Range 12/01/2014 11:56 12/01/2014 17:21 12/01/2014 21:30 12/02/2014 06:35 12/02/2014 11:17  Glucose-Capillary Latest Range: 70-99 mg/dL 252 (H) 281 (H) 296 (H) 180 (H) 284 (H)   Diabetes history: Type 2 diabetes Outpatient Diabetes medications: U500 (5 times more concentrated then U100 insulin) insulin 18 units with breakfast and 6-8 units with dinner, Invokamet 50-1000 mg bid Current orders for Inpatient glycemic control:  Novolog resist tid with meals and HS, Novolog 6 units tid with meals  Note that CBG's are greater than goal.  Patient was on U500 insulin prior to hospitalization.    May consider adding basal insulin while patient is in the hospital. Consider adding Lantus 35 units daily while patient is in the hospital.  Patient may resume U500 insulin at discharge.    Thanks, Adah Perl, RN, BC-ADM Inpatient Diabetes Coordinator Pager [phone]

## 2014-12-03 ENCOUNTER — Encounter (HOSPITAL_COMMUNITY): Payer: Self-pay | Admitting: Orthopaedic Surgery

## 2014-12-03 DIAGNOSIS — E1165 Type 2 diabetes mellitus with hyperglycemia: Secondary | ICD-10-CM

## 2014-12-03 DIAGNOSIS — I10 Essential (primary) hypertension: Secondary | ICD-10-CM

## 2014-12-03 LAB — GLUCOSE, CAPILLARY
GLUCOSE-CAPILLARY: 283 mg/dL — AB (ref 70–99)
GLUCOSE-CAPILLARY: 230 mg/dL — AB (ref 70–99)
Glucose-Capillary: 179 mg/dL — ABNORMAL HIGH (ref 70–99)
Glucose-Capillary: 223 mg/dL — ABNORMAL HIGH (ref 70–99)

## 2014-12-03 MED ORDER — INSULIN REGULAR HUMAN (CONC) 500 UNIT/ML ~~LOC~~ SOLN
30.0000 [IU] | Freq: Every day | SUBCUTANEOUS | Status: DC
  Administered 2014-12-03: 30 [IU] via SUBCUTANEOUS
  Filled 2014-12-03: qty 20

## 2014-12-03 MED ORDER — BISACODYL 10 MG RE SUPP
10.0000 mg | Freq: Once | RECTAL | Status: DC
  Filled 2014-12-03: qty 1

## 2014-12-03 MED ORDER — CANAGLIFLOZIN-METFORMIN HCL 50-1000 MG PO TABS: 1.0000 | ORAL_TABLET | Freq: Two times a day (BID) | ORAL | Status: DC

## 2014-12-03 MED ORDER — HYDROCODONE-ACETAMINOPHEN 7.5-325 MG PO TABS: 1.0000 | ORAL_TABLET | Freq: Four times a day (QID) | ORAL | Status: DC | PRN

## 2014-12-03 MED ORDER — CANAGLIFLOZIN 100 MG PO TABS
50.0000 mg | ORAL_TABLET | Freq: Two times a day (BID) | ORAL | Status: DC
  Administered 2014-12-03 – 2014-12-04 (×2): 50 mg via ORAL
  Filled 2014-12-03 (×5): qty 0.5

## 2014-12-03 MED ORDER — OXYCODONE HCL ER 10 MG PO T12A
10.0000 mg | EXTENDED_RELEASE_TABLET | Freq: Two times a day (BID) | ORAL | Status: DC
  Administered 2014-12-03 – 2014-12-04 (×3): 10 mg via ORAL
  Filled 2014-12-03 (×3): qty 1

## 2014-12-03 MED ORDER — METFORMIN HCL 500 MG PO TABS
1000.0000 mg | ORAL_TABLET | Freq: Two times a day (BID) | ORAL | Status: DC
  Administered 2014-12-03 – 2014-12-04 (×2): 1000 mg via ORAL
  Filled 2014-12-03 (×2): qty 2

## 2014-12-03 MED ORDER — HYDROCODONE-ACETAMINOPHEN 10-325 MG PO TABS
1.0000 | ORAL_TABLET | ORAL | Status: DC | PRN
  Administered 2014-12-03 (×2): 2 via ORAL
  Administered 2014-12-04: 1 via ORAL
  Filled 2014-12-03 (×2): qty 2
  Filled 2014-12-03: qty 1

## 2014-12-03 MED ORDER — INSULIN REGULAR HUMAN (CONC) 500 UNIT/ML ~~LOC~~ SOLN
90.0000 [IU] | Freq: Every day | SUBCUTANEOUS | Status: DC
  Administered 2014-12-04: 90 [IU] via SUBCUTANEOUS
  Filled 2014-12-03: qty 20

## 2014-12-03 NOTE — Progress Notes (Signed)
Orthopedic Tech Progress Note Patient Details:  Robert Gregory [birth year] [id number] On cpm at 7:30 pm   Patient ID: Robert Gregory, male   DOB: [birth year], 74 y.o.   MRN: [id number]   [name] 12/03/2014, 7:35 PM

## 2014-12-03 NOTE — Progress Notes (Signed)
   Subjective:  C/o constipation  Objective:   VITALS:   Filed Vitals:   12/02/14 1600 12/02/14 1950 12/02/14 2158 12/03/14 0529  BP:  137/53  121/58  Pulse:  87 88 69  Temp:  98.9 F (37.2 C)  97.8 F (36.6 C)  TempSrc:  Oral    Resp: 18 20 18 20   Height:      Weight:      SpO2: 96% 100% 94% 100%    Incision c/d/i    Lab Results  Component Value Date   WBC 8.6 12/02/2014   HGB 10.7* 12/02/2014   HCT 32.6* 12/02/2014   MCV 91.8 12/02/2014   PLT 116* 12/02/2014     Assessment/Plan:  4 Days Post-Op   - suppository ordered - CPM for 6 hours total a day.  May break up into multiple sessions as tolerated by patient - dressing changed - oxycodone stopped, hydrocodone started for slight confusion - lantus 35 units ordered for better BS control - stable from ortho standpoint - home tomorrow possibly  Marianna Payment 12/03/2014, 8:41 AM [phone]

## 2014-12-03 NOTE — Progress Notes (Signed)
Physical Therapy Treatment Patient Details Name: CALIB WADHWA MRN: [id number] DOB: 11/22/1940 Today's Date: 12/03/2014    History of Present Illness Patient is a 74 yo male admitted 11/29/14 now s/p Rt TKA.  PMH:  CAD, MI, ICM, ICD placement, Afib, HTN, CABG, HLD, OSA, spinal stenosis, laminectomy, DM, peripheral neuropathy, CHF, arthritis, s/p Lt TKA, rotator cuff repair    PT Comments    Continuing improvements with gait, activity tolerance and amb distance; Ended session in bathroom as pt was hopeful to move his bowels; Needs more practice with bed mobility and therex  Follow Up Recommendations  Home health PT;Supervision/Assistance - 24 hour     Equipment Recommendations  None recommended by PT    Recommendations for Other Services       Precautions / Restrictions Precautions Precautions: Knee Precaution Comments: Pt educated to not allow any pillow or bolster under knee for healing with optimal range of motion.  Required Braces or Orthoses: Knee Immobilizer - Right Knee Immobilizer - Right: On when out of bed or walking Restrictions Weight Bearing Restrictions: Yes RLE Weight Bearing: Weight bearing as tolerated    Mobility  Bed Mobility                  Transfers Overall transfer level: Needs assistance Equipment used: Rolling walker (2 wheeled) Transfers: Sit to/from Stand Sit to Stand: Min guard         General transfer comment: cues for safety, hand placement, and to breathe  Ambulation/Gait Ambulation/Gait assistance: Min guard (with and without physical contact) Ambulation Distance (Feet): 90 Feet Assistive device: Rolling walker (2 wheeled) Gait Pattern/deviations: Step-through pattern;Trunk flexed     General Gait Details: Cues for sequence and to activate quad for stance stability; cues also to self-monitor for activity tolerance, and relax shoulders; no seated rest break   Stairs            Wheelchair Mobility    Modified Rankin  (Stroke Patients Only)       Balance                                    Cognition Arousal/Alertness: Awake/alert Behavior During Therapy: WFL for tasks assessed/performed Overall Cognitive Status: Within Functional Limits for tasks assessed                      Exercises      General Comments        Pertinent Vitals/Pain Pain Assessment: 0-10 Pain Score: 5  Pain Location: R knee with amb Pain Descriptors / Indicators: Aching Pain Intervention(s): Monitored during session    Home Living                      Prior Function            PT Goals (current goals can now be found in the care plan section) Acute Rehab PT Goals Patient Stated Goal: to go home tomorrow PT Goal Formulation: With patient/family Time For Goal Achievement: 12/06/14 Potential to Achieve Goals: Good Progress towards PT goals: Progressing toward goals    Frequency  7X/week    PT Plan Current plan remains appropriate    Co-evaluation PT/OT/SLP Co-Evaluation/Treatment: Yes Reason for Co-Treatment: For patient/therapist safety (mobility status fluctuates) PT goals addressed during session: Mobility/safety with mobility OT goals addressed during session: ADL's and self-care     End of Session  Equipment Utilized During Treatment: Gait belt;Right knee immobilizer Activity Tolerance: Patient tolerated treatment well Patient left: with call bell/phone within reach;with family/visitor present (in bathroom)     Time: 7022-0266 PT Time Calculation (min) (ACUTE ONLY): 14 min  Charges:  $Gait Training: 8-22 mins                    G Codes:      [name] 12/03/2014, 12:50 PM  [name], [place] Pager [phone] Office [phone]

## 2014-12-03 NOTE — Progress Notes (Signed)
Occupational Therapy Treatment Patient Details Name: Robert Gregory MRN: [id number] DOB: 1940/11/10 Today's Date: 12/03/2014    History of present illness Patient is a 74 yo male admitted 11/29/14 now s/p Rt TKA.  PMH:  CAD, MI, ICM, ICD placement, Afib, HTN, CABG, HLD, OSA, spinal stenosis, laminectomy, DM, peripheral neuropathy, CHF, arthritis, s/p Lt TKA, rotator cuff repair   OT comments  Patient with improved mobility/mentation today. Co-treat with PT for safety given fluctuating status yesterday. Patient now progressing well. OT education ongoing. Plan to try shower transfer tomorrow.  Follow Up Recommendations  Home health OT;Supervision/Assistance - 24 hour    Equipment Recommendations  Other (comment) (?wide BSC -- patient to decide, AE)    Recommendations for Other Services      Precautions / Restrictions Precautions Precautions: Knee Required Braces or Orthoses: Knee Immobilizer - Right Knee Immobilizer - Right: On when out of bed or walking Restrictions Weight Bearing Restrictions: Yes RLE Weight Bearing: Weight bearing as tolerated       Mobility Bed Mobility                  Transfers Overall transfer level: Needs assistance Equipment used: Rolling walker (2 wheeled) Transfers: Sit to/from Stand Sit to Stand: Min guard         General transfer comment: cues for safety, hand placement    Balance                                   ADL Overall ADL's : Needs assistance/impaired Eating/Feeding: Set up;Bed level   Grooming: Set up;Sitting           Upper Body Dressing : Set up;Sitting   Lower Body Dressing: Moderate assistance;With adaptive equipment;Sit to/from stand   Toilet Transfer: Min guard;Ambulation;Comfort height toilet;Grab bars;RW   Toileting- Clothing Manipulation and Hygiene: Sit to/from stand;Minimal assistance       Functional mobility during ADLs: Min guard;Rolling walker General ADL Comments: Discussed with  patient and wife regarding sock aid vs Ted hose donner and showed patient's wife pictures of each. She has arthritis in her hands and patient wears support hose at all times (even prior to surgery). Patient educated that sock aid can be purchased in gift shop but Ted hose donner would probably have to be purchased at a medical supply store. Patient and wife would like to practice shower stall transfer next session. Patient left in room on toilet, states, "I'll be here for a while." Patient to call for nursing when finished.      Vision                     Perception     Praxis      Cognition   Behavior During Therapy: WFL for tasks assessed/performed Overall Cognitive Status: Within Functional Limits for tasks assessed                       Extremity/Trunk Assessment               Exercises     Shoulder Instructions       General Comments      Pertinent Vitals/ Pain       Pain Assessment: 0-10 Pain Score: 5  Pain Location: R knee Pain Descriptors / Indicators: Aching Pain Intervention(s): Limited activity within patient's tolerance;Monitored during session  Home Living  Prior Functioning/Environment              Frequency Min 2X/week     Progress Toward Goals  OT Goals(current goals can now be found in the care plan section)  Progress towards OT goals: Progressing toward goals  Acute Rehab OT Goals Patient Stated Goal: to go home tomorrow  Plan Discharge plan needs to be updated    Co-evaluation    PT/OT/SLP Co-Evaluation/Treatment: Yes Reason for Co-Treatment: For patient/therapist safety (patient's mobility status has been fluctuating) PT goals addressed during session: Mobility/safety with mobility;Proper use of DME OT goals addressed during session: ADL's and self-care      End of Session Equipment Utilized During Treatment: Rolling walker;Gait belt   Activity  Tolerance Patient tolerated treatment well   Patient Left with family/visitor present;Other (comment) (on toilet, patient states he will call nursing when through)   Nurse Communication          Time: 1003-1020 OT Time Calculation (min): 17 min  Charges: OT General Charges $OT Visit: 1 Procedure OT Treatments $Self Care/Home Management : 8-22 mins  Sarit Sparano A 12/03/2014, 10:30 AM

## 2014-12-03 NOTE — Discharge Instructions (Signed)
Information on my medicine - XARELTO (Rivaroxaban)  This medication education was reviewed with me or my healthcare representative as part of my discharge preparation.  The pharmacist that spoke with me during my hospital stay was:  Georgina Peer, [facility]  Why was Xarelto prescribed for you? Xarelto was prescribed for you to reduce the risk of a blood clot forming that can cause a stroke if you have a medical condition called atrial fibrillation (a type of irregular heartbeat).  What do you need to know about xarelto ? Take your Xarelto ONCE DAILY at the same time every day with your evening meal. If you have difficulty swallowing the tablet whole, you may crush it and mix in applesauce just prior to taking your dose.  Take Xarelto exactly as prescribed by your doctor and DO NOT stop taking Xarelto without talking to the doctor who prescribed the medication.  Stopping without other stroke prevention medication to take the place of Xarelto may increase your risk of developing a clot that causes a stroke.  Refill your prescription before you run out.  After discharge, you should have regular check-up appointments with your healthcare provider that is prescribing your Xarelto.  In the future your dose may need to be changed if your kidney function or weight changes by a significant amount.  What do you do if you miss a dose? If you are taking Xarelto ONCE DAILY and you miss a dose, take it as soon as you remember on the same day then continue your regularly scheduled once daily regimen the next day. Do not take two doses of Xarelto at the same time or on the same day.   Important Safety Information A possible side effect of Xarelto is bleeding. You should call your healthcare provider right away if you experience any of the following: ? Bleeding from an injury or your nose that does not stop. ? Unusual colored urine (red or dark brown) or unusual colored stools (red or  black). ? Unusual bruising for unknown reasons. ? A serious fall or if you hit your head (even if there is no bleeding).  Some medicines may interact with Xarelto and might increase your risk of bleeding while on Xarelto. To help avoid this, consult your healthcare provider or pharmacist prior to using any new prescription or non-prescription medications, including herbals, vitamins, non-steroidal anti-inflammatory drugs (NSAIDs) and supplements.  This website has more information on Xarelto: [email or web address]

## 2014-12-03 NOTE — Progress Notes (Signed)
Pharmacy consult note  U500 insulin  Dosing clarified with patient/wife - he takes 18 units - pulls regular insulin syringe back to 18 unit mark with breakfast everyday (0.101m), 5-10 units with dinner based on feeling and cbg readings  D/w [facility]- M. [facility] -will give 6 units tonight - as he had 35 units of lantus this am and continue with 18 in the morning. Further dosing adjustments based on CBGs  Wife bringing in medication from home this afternoon.  FErin HearingPharmD., BCPS Clinical Pharmacist Pager [phone]/15/2016 3:17 PM

## 2014-12-03 NOTE — Consult Note (Signed)
[facility] Medical Consultation  Robert Gregory AVW:[id number] DOB: May 28, 1941 DOA: 11/29/2014 PCP: Robert Roup, MD   Requesting physician: Robert Gregory Date of consultation: 12/03/2014 Reason for consultation: hyperglycemia  Impression/Recommendations Active Problems:   Diabetes mellitus   Atrial fibrillation   ICD (implantable cardioverter-defibrillator) in place   Essential hypertension   Hyperlipidemia   Cardiomyopathy, ischemic   Total knee replacement status  Total knee replacement Patient was admitted for end stage degenerative joint disease and is s/p knee replacement Management per primary team.  Hyperglycemia in the setting of well controlled DM Takes U500 at home.  He and his wife (the retired Therapist, sports) are very well versed in how to administer it. Also takes invokana-metformin.  Will restart both of these.  His CBGs at home normally run est. 150. Patient will take his own U500 brought in from home.  I appreciate pharmacy's help this process. He normally takes 18 units with breakfast and a sliding scale of 5-8 units with dinner. I recommend taking these medications as he would at home.  We will increase the frequency of CBGs once the 500s started. I am very comfortable with CBGs of 180 - 200 while his is inpatient.  I believe his CBGs will return to normal when he is discharged. I have asked case management to assist with setting up and [facility] Endocrinology appointment as follow up.  Note to [facility] rounding MD on 3/16, If this patient is going to remain inpatient past 3/16  - please reset the CBG orders.  Afib Rate controlled.  Anticoagulated on Xeralto.  HTN Well controlled on Coreg,  Avapro, spironolactone  HLD Zetia and statin.  [facility] will followup again tomorrow. Please contact me if I can be of assistance in the meanwhile. Thank you for this consultation.  Chief Complaint: Hyperglycemia  HPI:  Robert Gregory was admitted with end stage degenerative joint disease  for right knee replacement. He has been diabetic for 15 years. He and his wife are very concerned about good CBG control and appear to be extremely compliant. The patient reports that he has no symptoms of hypoglycemia, thus they are very concerned about potential drops in CBG.   He was placed on U5 100 insulin by his endocrinologist out [place] and has done well. He reports his CBGs are normally in the 120-150 range. He has moved to [facility], and does not have an endocrinologist at this time. In the hospital his CBGs have been between 175 and almost 300. He's been treated with Lantus and NovoLog.  Orthopedic surgery would like to improve his CBGs and have asked [facility] consult. It is felt likely that Robert Gregory will discharge to home in the next 24-48 hours.  Review of Systems:  He denies recent illness, fever, chest pain, difficulty breathing, abdominal pain, changes in bowel habits, dysuria.  He reports a rash on the posterior portion of his right knee.  A 10 point review of systems was completed and found to be negative.   Past Medical History  Diagnosis Date  . CAD (coronary artery disease)   . Atrial fibrillation     takes Xarelto daily  . Hyperlipidemia     takes [employer] daily  . Obstructive sleep apnea   . Nephrolithiasis   . Thrombocytopenia   . Cholelithiasis   . Spinal stenosis   . ICD (implantable cardioverter-defibrillator) battery depletion   . Ischemic cardiomyopathy   . Congestive heart failure     takes Spironolactone daily  .  Hypertension     takes Benicar and Coreg daily  . Myocardial infarction 1997    on the OR table   . Complication of anesthesia     forgetful for about 2-3wks after anesthesia  . Pneumonia     as a child  . Peripheral neuropathy   . Arthritis   . Joint pain   . Chronic back pain     scoliosis/arthritis  . History of colon polyps   . Urinary frequency   . Urinary urgency   . History of kidney stones   .  Diabetes mellitus     takes Invokamet daily as well as Hum U   Past Surgical History  Procedure Laterality Date  . Coronary artery bypass and graft  1997    x 5  . Right rotator cuff surgery    . Total knee arthroplasty Left   . Laminectomy    . Tonsillectomy      adenoids  . Icd placed    . Colonoscopy    . Esophagogastroduodenoscopy    . Lithotripsy    . Total knee arthroplasty Right 11/29/2014    Procedure: RIGHT TOTAL KNEE ARTHROPLASTY;  Surgeon: Leandrew Koyanagi, MD;  Location: [facility];  Service: Orthopedics;  Laterality: Right;   Social History:  reports that he has never smoked. He does not have any smokeless tobacco history on file. He reports that he drinks alcohol. He reports that he does not use illicit drugs.  Allergies  Allergen Reactions  . Adhesive [Tape]     Rash   . Iodine   . Contrast Media [Iodinated Diagnostic Agents] Rash and Other (See Comments)    flushing EXTREME FLUSHING    Family History  Problem Relation Age of Onset  . Heart disease      No family history    Prior to Admission medications   Medication Sig Start Date End Date Taking? Authorizing Provider  Canagliflozin-Metformin HCl (INVOKAMET) 50-1000 MG TABS Take 1 tablet by mouth 2 (two) times daily.   Yes Historical Provider, MD  carvedilol (COREG) 12.5 MG tablet Take 1 tablet (12.5 mg total) by mouth 2 (two) times daily with a meal. 07/31/14  Yes Lelon Perla, MD  Cholecalciferol (VITAMIN D PO) Take 5,000 Units by mouth daily.   Yes Historical Provider, MD  Cinnamon 500 MG TABS Take 500 mg by mouth daily.    Yes Historical Provider, MD  clobetasol ointment (TEMOVATE) 8.75 % Apply 1 application topically as needed.   Yes Historical Provider, MD  Coenzyme Q10 200 MG TABS Take 1 tablet by mouth daily.   Yes Historical Provider, MD  Cranberry 200 MG CAPS Take 300 mg by mouth daily.    Yes Historical Provider, MD  ezetimibe (ZETIA) 10 MG tablet Take 1 tablet (10 mg total) by mouth daily. 07/31/14   Yes Lelon Perla, MD  fenofibrate (TRICOR) 48 MG tablet Take 1 tablet (48 mg total) by mouth daily. 07/31/14  Yes Lelon Perla, MD  furosemide (LASIX) 40 MG tablet Take 2 tablets (80 mg total) by mouth 2 (two) times daily. Patient taking differently: Take 40 mg by mouth 2 (two) times daily.  08/27/14  Yes Lelon Perla, MD  Insulin Regular Human (HUMULIN R U-500, CONCENTRATED, [place]) Inject 18 Units into the skin. 18 U @ BREAKFAST AND 6 TO 8 U @ DINNER   Yes Historical Provider, MD  montelukast (SINGULAIR) 10 MG tablet Take 10 mg by mouth at bedtime.  Yes Historical Provider, MD  Multiple Vitamins-Minerals (CENTRUM SILVER PO) Take 1 tablet by mouth daily.   Yes Historical Provider, MD  olmesartan (BENICAR) 40 MG tablet Take 2 tablets (80 mg total) by mouth daily. 07/31/14  Yes Lelon Perla, MD  omega-3 acid ethyl esters (LOVAZA) 1 G capsule Take 2 capsules (2 g total) by mouth 2 (two) times daily. 07/31/14  Yes Lelon Perla, MD  potassium chloride (K-DUR) 10 MEQ tablet Take 2 tablets (20 mEq total) by mouth 2 (two) times daily. 07/31/14  Yes Lelon Perla, MD  pravastatin (PRAVACHOL) 80 MG tablet Take 1 tablet (80 mg total) by mouth daily. 07/31/14  Yes Lelon Perla, MD  Probiotic Product (PROBIOTIC DAILY PO) Take 1 tablet by mouth daily.   Yes Historical Provider, MD  amoxicillin (AMOXIL) 500 MG capsule Take 500 mg by mouth. PRIOR TO DENTAL WORK    Historical Provider, MD  HYDROcodone-acetaminophen (NORCO) 7.5-325 MG per tablet Take 1-2 tablets by mouth every 6 (six) hours as needed for moderate pain. 12/03/14   Naiping Ephriam Jenkins, MD  methocarbamol (ROBAXIN) 750 MG tablet Take 1 tablet (750 mg total) by mouth 2 (two) times daily as needed for muscle spasms. 11/30/14   Naiping Ephriam Jenkins, MD  nitroGLYCERIN (NITROSTAT) 0.4 MG SL tablet Place 0.4 mg under the tongue every 5 (five) minutes as needed for chest pain.    Historical Provider, MD  rivaroxaban (XARELTO) 20 MG TABS tablet Take 1  tablet (20 mg total) by mouth daily with supper. 07/31/14   Lelon Perla, MD  spironolactone (ALDACTONE) 25 MG tablet Take 1 tablet (25 mg total) by mouth daily. 08/27/14   Lelon Perla, MD   Physical Exam: Blood pressure 111/46, pulse 72, temperature 97.7 F (36.5 C), temperature source Oral, resp. rate 18, height 6' (1.829 m), weight 154.042 kg (339 lb 9.6 oz), SpO2 97 %. Filed Vitals:   12/03/14 1350  BP: 111/46  Pulse: 72  Temp: 97.7 F (36.5 C)  Resp: 18     General:  well-developed, obese, pleasant male in no apparent distress  Eyes: Pupils are equal and round, sclerae clear  ENT: Oropharynx is without erythema or exudates  Neck: Thick without lymphadenopathy, supple  Cardiovascular: regular rate and rhythm, no frank murmurs rubs or gallops.  Respiratory: clear to auscultation, no increased work of breathing  Abdomen: obese, nontender, nondistended, positive bowel sounds, no masses  Skin: No lesions or bruises.  Musculoskeletal: right lower extremity with surgical incision over the knee, bandage appears clean and dry.  Psychiatric: alert and oriented, cooperative, well-groomed.  Labs on Admission:  Basic Metabolic Panel:  Recent Labs Lab 11/30/14 0630 12/01/14 0554  NA 136 132*  K 4.7 4.7  CL 100 101  CO2 27 21  GLUCOSE 237* 262*  BUN 29* 19  CREATININE 1.53* 1.16  CALCIUM 9.1 8.9   CBC:  Recent Labs Lab 11/29/14 1038 11/30/14 0630 12/01/14 0554 12/02/14 0631  WBC  --  5.9 7.6 8.6  HGB  --  10.7* 11.2* 10.7*  HCT  --  32.8* 32.8* 32.6*  MCV  --  93.4 89.9 91.8  PLT 84* 81* 78* 116*   CBG:  Recent Labs Lab 12/02/14 1117 12/02/14 1619 12/02/14 2220 12/03/14 0626 12/03/14 1111  GLUCAP 284* 246* 214* 179* 283*    Time spent: 50 minutes  Karen Kitchens  [facility] Pager [phone]   If 7PM-7AM, please contact night-coverage [email or web address] Password [facility] 12/03/2014, 2:49  PM

## 2014-12-03 NOTE — Progress Notes (Signed)
Physical Therapy Treatment Patient Details Name: DIJON COSENS MRN: [id number] DOB: [birth year] Today's Date: 12/03/2014    History of Present Illness Patient is a 74 yo male admitted 11/29/14 now s/p Rt TKA.  PMH:  CAD, MI, ICM, ICD placement, Afib, HTN, CABG, HLD, OSA, spinal stenosis, laminectomy, DM, peripheral neuropathy, CHF, arthritis, s/p Lt TKA, rotator cuff repair    PT Comments    Making gains in bed mobility and transfers, even with feeling quite tired this afternoon; Requiring far less assist with getting into bed; on track for dc home tomorrow  Follow Up Recommendations  Home health PT;Supervision/Assistance - 24 hour     Equipment Recommendations  None recommended by PT    Recommendations for Other Services       Precautions / Restrictions Precautions Precautions: Knee Precaution Comments: Pt educated to not allow any pillow or bolster under knee for healing with optimal range of motion.  Required Braces or Orthoses: Knee Immobilizer - Right Knee Immobilizer - Right: On when out of bed or walking Restrictions Weight Bearing Restrictions: Yes RLE Weight Bearing: Weight bearing as tolerated    Mobility  Bed Mobility Overal bed mobility: Needs Assistance Bed Mobility: Supine to Sit;Sit to Supine     Supine to sit: Min guard Sit to supine: Min guard   General bed mobility comments: Cues for technique; good improvements here  Transfers Overall transfer level: Needs assistance Equipment used: Rolling walker (2 wheeled) Transfers: Sit to/from Stand Sit to Stand: Min guard         General transfer comment: cues for safety, hand placement, and to breathe; Fatigued in afternoon, and initial attempt at standing he had to sit down; better ability to stand after he scooted hips towards Edge of chair  Ambulation/Gait Ambulation/Gait assistance: Min guard (with and without physical contact) Ambulation Distance (Feet): 60 Feet Assistive device: Rolling walker (2  wheeled) Gait Pattern/deviations: Step-through pattern     General Gait Details: Cues for sequence and to activate quad for stance stability; cues also to self-monitor for activity tolerance, and relax shoulders; no seated rest break; fatigued at end of amb   Stairs            Wheelchair Mobility    Modified Rankin (Stroke Patients Only)       Balance                                    Cognition Arousal/Alertness: Awake/alert Behavior During Therapy: WFL for tasks assessed/performed Overall Cognitive Status: Within Functional Limits for tasks assessed                      Exercises Total Joint Exercises Quad Sets: AROM;Right;10 reps Heel Slides: AAROM;Right;10 reps Straight Leg Raises: AAROM;AROM;Right;10 reps Goniometric ROM: 0-75    General Comments        Pertinent Vitals/Pain Pain Assessment: Faces Pain Score: 5  Faces Pain Scale: Hurts a little bit Pain Location: R knee in CPM at end of session Pain Descriptors / Indicators: Aching;Grimacing;Discomfort Pain Intervention(s): Monitored during session;Premedicated before session    Home Living                      Prior Function            PT Goals (current goals can now be found in the care plan section) Acute Rehab PT Goals Patient Stated Goal:  to go home tomorrow PT Goal Formulation: With patient/family Time For Goal Achievement: 12/06/14 Potential to Achieve Goals: Good Progress towards PT goals: Progressing toward goals    Frequency  7X/week    PT Plan Current plan remains appropriate    Co-evaluation PT/OT/SLP Co-Evaluation/Treatment: Yes Reason for Co-Treatment: For patient/therapist safety (mobility status fluctuates) PT goals addressed during session: Mobility/safety with mobility       End of Session Equipment Utilized During Treatment: Gait belt;Right knee immobilizer Activity Tolerance: Patient tolerated treatment well Patient left: in bed;in  CPM;with call bell/phone within reach;with family/visitor present     Time: 3073-5430 PT Time Calculation (min) (ACUTE ONLY): 30 min  Charges:  $Gait Training: 8-22 mins $Therapeutic Exercise: 8-22 mins $Therapeutic Activity: 8-22 mins                    G Codes:      [name] 12/03/2014, 4:04 PM  [name], [place] Pager [phone] Office [phone]

## 2014-12-04 DIAGNOSIS — E118 Type 2 diabetes mellitus with unspecified complications: Secondary | ICD-10-CM

## 2014-12-04 LAB — GLUCOSE, CAPILLARY
GLUCOSE-CAPILLARY: 160 mg/dL — AB (ref 70–99)
GLUCOSE-CAPILLARY: 167 mg/dL — AB (ref 70–99)
GLUCOSE-CAPILLARY: 181 mg/dL — AB (ref 70–99)
GLUCOSE-CAPILLARY: 219 mg/dL — AB (ref 70–99)
Glucose-Capillary: 186 mg/dL — ABNORMAL HIGH (ref 70–99)
Glucose-Capillary: 158 mg/dL — ABNORMAL HIGH (ref 70–99)
Glucose-Capillary: 141 mg/dL — ABNORMAL HIGH (ref 70–99)

## 2014-12-04 LAB — HEMOGLOBIN A1C
Hgb A1c MFr Bld: 8 % — ABNORMAL HIGH (ref 4.8–5.6)
Mean Plasma Glucose: 183 mg/dL

## 2014-12-04 MED ORDER — POLYETHYLENE GLYCOL 3350 17 G PO PACK: 17.0000 g | PACK | Freq: Every day | ORAL | Status: DC | PRN

## 2014-12-04 MED ORDER — SENNA 8.6 MG PO TABS: 1.0000 | ORAL_TABLET | Freq: Two times a day (BID) | ORAL | Status: DC

## 2014-12-04 NOTE — Progress Notes (Signed)
   Subjective:  Had BM yesterday.  Pain well controlled.  Objective:   VITALS:   Filed Vitals:   12/03/14 1350 12/03/14 1522 12/03/14 2100 12/04/14 0430  BP: 111/46  135/54 133/61  Pulse: 72  82 73  Temp: 97.7 F (36.5 C)  98.6 F (37 C) 97.8 F (36.6 C)  TempSrc: Oral  Oral Axillary  Resp: 18 18 18 18   Height:      Weight:      SpO2: 97% 97% 98% 99%    Dressing c/d/i Pain controlled    Lab Results  Component Value Date   WBC 8.6 12/02/2014   HGB 10.7* 12/02/2014   HCT 32.6* 12/02/2014   MCV 91.8 12/02/2014   PLT 116* 12/02/2014     Assessment/Plan:  5 Days Post-Op   - had BM yesterday - BS much better in the 100s - stable from ortho stand point for discharge - patient will have endocrine f/u appt with Daniel Nones 12/04/2014, 7:55 AM [phone]

## 2014-12-04 NOTE — Discharge Summary (Signed)
Physician Discharge Summary      Patient ID: Robert Gregory MRN: [id number] DOB/AGE: 1940-12-11 74 y.o.  Admit date: 11/29/2014 Discharge date: 12/04/2014  Admission Diagnoses:  Right knee osteoarthritis  Discharge Diagnoses:  Active Problems:   Atrial fibrillation   ICD (implantable cardioverter-defibrillator) in place   Essential hypertension   Hyperlipidemia   Cardiomyopathy, ischemic   Total knee replacement status   Diabetes mellitus   Past Medical History  Diagnosis Date  . CAD (coronary artery disease)   . Atrial fibrillation     takes Xarelto daily  . Hyperlipidemia     takes [employer] daily  . Obstructive sleep apnea   . Nephrolithiasis   . Thrombocytopenia   . Cholelithiasis   . Spinal stenosis   . ICD (implantable cardioverter-defibrillator) battery depletion   . Ischemic cardiomyopathy   . Congestive heart failure     takes Spironolactone daily  . Hypertension     takes Benicar and Coreg daily  . Myocardial infarction 1997    on the OR table   . Complication of anesthesia     forgetful for about 2-3wks after anesthesia  . Pneumonia     as a child  . Peripheral neuropathy   . Arthritis   . Joint pain   . Chronic back pain     scoliosis/arthritis  . History of colon polyps   . Urinary frequency   . Urinary urgency   . History of kidney stones   . Diabetes mellitus     takes Invokamet daily as well as Hum U    Surgeries: Procedure(s): RIGHT TOTAL KNEE ARTHROPLASTY on 11/29/2014   Consultants (if any): Hospitalist for elevated blood glucose  Discharged Condition: Improved  Hospital Course: Robert Gregory is an 74 y.o. male who was admitted 11/29/2014 with a diagnosis of right knee osteoarthritis and went to the operating room on 11/29/2014 and underwent the above named procedures.    He was given perioperative antibiotics:  Anti-infectives    Start     Dose/Rate Route Frequency Ordered Stop   11/29/14 1830  ceFAZolin  (ANCEF) IVPB 2 g/50 mL premix     2 g 100 mL/hr over 30 Minutes Intravenous Every 6 hours 11/29/14 1659 11/30/14 0035   11/29/14 0600  ceFAZolin (ANCEF) 3 g in dextrose 5 % 50 mL IVPB     3 g 160 mL/hr over 30 Minutes Intravenous On call to O.R. 11/28/14 1259 11/29/14 1225    .  He was given sequential compression devices, early ambulation, and xarelto for DVT prophylaxis.  He benefited maximally from the hospital stay and there were no complications.  His blood sugars were under good control by the time of discharge.    Recent vital signs:  Filed Vitals:   12/04/14 0430  BP: 133/61  Pulse: 73  Temp: 97.8 F (36.6 C)  Resp: 18    Recent laboratory studies:  Lab Results  Component Value Date   HGB 10.7* 12/02/2014   HGB 11.2* 12/01/2014   HGB 10.7* 11/30/2014   Lab Results  Component Value Date   WBC 8.6 12/02/2014   PLT 116* 12/02/2014   Lab Results  Component Value Date   INR 1.11 11/29/2014   Lab Results  Component Value Date   NA 132* 12/01/2014   K 4.7 12/01/2014   CL 101 12/01/2014   CO2 21 12/01/2014   BUN 19 12/01/2014   CREATININE 1.16 12/01/2014   GLUCOSE 262* 12/01/2014    Discharge  Medications:     Medication List    TAKE these medications        amoxicillin 500 MG capsule  Commonly known as:  AMOXIL  Take 500 mg by mouth. PRIOR TO DENTAL WORK     carvedilol 12.5 MG tablet  Commonly known as:  COREG  Take 1 tablet (12.5 mg total) by mouth 2 (two) times daily with a meal.     CENTRUM SILVER PO  Take 1 tablet by mouth daily.     Cinnamon 500 MG Tabs  Take 500 mg by mouth daily.     clobetasol ointment 0.05 %  Commonly known as:  TEMOVATE  Apply 1 application topically as needed.     Coenzyme Q10 200 MG Tabs  Take 1 tablet by mouth daily.     Cranberry 200 MG Caps  Take 300 mg by mouth daily.     ezetimibe 10 MG tablet  Commonly known as:  ZETIA  Take 1 tablet (10 mg total) by mouth daily.     fenofibrate 48 MG tablet    Commonly known as:  TRICOR  Take 1 tablet (48 mg total) by mouth daily.     furosemide 40 MG tablet  Commonly known as:  LASIX  Take 2 tablets (80 mg total) by mouth 2 (two) times daily.     HUMULIN R U-500 (CONCENTRATED) [place]  Inject 18 Units into the skin. 18 U @ BREAKFAST AND 6 TO 8 U @ DINNER     HYDROcodone-acetaminophen 7.5-325 MG per tablet  Commonly known as:  NORCO  Take 1-2 tablets by mouth every 6 (six) hours as needed for moderate pain.     INVOKAMET 50-1000 MG Tabs  Generic drug:  Canagliflozin-Metformin HCl  Take 1 tablet by mouth 2 (two) times daily.     methocarbamol 750 MG tablet  Commonly known as:  ROBAXIN  Take 1 tablet (750 mg total) by mouth 2 (two) times daily as needed for muscle spasms.     montelukast 10 MG tablet  Commonly known as:  SINGULAIR  Take 10 mg by mouth at bedtime.     nitroGLYCERIN 0.4 MG SL tablet  Commonly known as:  NITROSTAT  Place 0.4 mg under the tongue every 5 (five) minutes as needed for chest pain.     olmesartan 40 MG tablet  Commonly known as:  BENICAR  Take 2 tablets (80 mg total) by mouth daily.     omega-3 acid ethyl esters 1 G capsule  Commonly known as:  LOVAZA  Take 2 capsules (2 g total) by mouth 2 (two) times daily.     polyethylene glycol packet  Commonly known as:  MIRALAX / GLYCOLAX  Take 17 g by mouth daily as needed for mild constipation.     potassium chloride 10 MEQ tablet  Commonly known as:  K-DUR  Take 2 tablets (20 mEq total) by mouth 2 (two) times daily.     pravastatin 80 MG tablet  Commonly known as:  PRAVACHOL  Take 1 tablet (80 mg total) by mouth daily.     PROBIOTIC DAILY PO  Take 1 tablet by mouth daily.     rivaroxaban 20 MG Tabs tablet  Commonly known as:  XARELTO  Take 1 tablet (20 mg total) by mouth daily with supper.     senna 8.6 MG Tabs tablet  Commonly known as:  SENOKOT  Take 1 tablet (8.6 mg total) by mouth 2 (two) times daily.  spironolactone 25 MG tablet  Commonly  known as:  ALDACTONE  Take 1 tablet (25 mg total) by mouth daily.     VITAMIN D PO  Take 5,000 Units by mouth daily.        Diagnostic Studies: Dg Knee Right Port  11/29/2014   CLINICAL DATA:  Postop knee replacement  EXAM: PORTABLE RIGHT KNEE - 1-2 VIEW  COMPARISON:  CT right knee dated 08/20/2014  FINDINGS: Right total knee arthroplasty in satisfactory position.  Overlying skin staples with associated surgical drain and subcutaneous gas.  Surgical clips related to vein harvest.  No fracture is seen.  IMPRESSION: Right total knee arthroplasty in satisfactory position.   Electronically Signed   By: Julian Hy M.D.   On: 11/29/2014 15:34    Disposition: 01-Home or Self Care      Discharge Instructions    Call MD / Call 911    Complete by:  As directed   If you experience chest pain or shortness of breath, CALL 911 and be transported to the hospital emergency room.  If you develope a fever above 101.5 F, pus (white drainage) or increased drainage or redness at the wound, or calf pain, call your surgeon's office.     Change dressing    Complete by:  As directed   Change dressing on Friday, then change the dressing daily with sterile 4 x 4 inch gauze dressing and apply TED hose.  You may clean the incision with alcohol prior to redressing.     Constipation Prevention    Complete by:  As directed   Drink plenty of fluids.  Prune juice may be helpful.  You may use a stool softener, such as Colace (over the counter) 100 mg twice a day.  Use MiraLax (over the counter) for constipation as needed.     Diet - low sodium heart healthy    Complete by:  As directed      Diet general    Complete by:  As directed      Driving restrictions    Complete by:  As directed   No driving while taking narcotic pain meds.     Increase activity slowly as tolerated    Complete by:  As directed      TED hose    Complete by:  As directed   Use stockings (TED hose) for 6 weeks on both leg(s).  You may  remove them at night for sleeping.           Follow-up Information    Follow up with [facility].   Why:  They will contact you to schedule home therapy visits.   Contact information:   [address] [place] [address] [phone]        Signed: Marianna Payment 12/04/2014, 3:04 PM

## 2014-12-04 NOTE — Progress Notes (Signed)
12/04/14 [facility], Dr. Cindra Eves office, he does not have a new patient appt availble until July. Informed patient and his wife, they will contact patient's PCP and move up his appt with his PCP and talk with her about finding an endocrinologist.

## 2014-12-04 NOTE — Consult Note (Signed)
   [facility] Medical Consultation  Robert Gregory XBW:[id number] DOB: 08-29-1941 DOA: 11/29/2014 PCP: Ileana Roup, MD   Requesting physician: Dr. Erlinda Hong Date of consultation: 12/03/2014 Reason for consultation: hyperglycemia  HPI [name] was admitted with end stage degenerative joint disease for right knee replacement. [facility] asked to see in consult for DM.  Impression/Recommendations Active Problems:   Atrial fibrillation   ICD (implantable cardioverter-defibrillator) in place   Essential hypertension   Hyperlipidemia   Cardiomyopathy, ischemic   Total knee replacement status   Diabetes mellitus  Hyperglycemia in the setting of well controlled DM Takes U500 at home.  He and his wife (the retired Therapist, sports) are very well versed in how to administer it. He is on invokana-metformin.   he is on U500 90U with breakfast,  30U with supper,  CBGs much improved this morning, should be able to go home today  I have asked case management to assist with setting up and [facility] appointment as follow up. He is supposed to see Dr. Buddy Duty per patient.   Total knee replacement Patient was admitted for end stage degenerative joint disease and is s/p knee replacement, Management per primary team.  Afib Rate controlled.  Anticoagulated on Xeralto.  HTN Well controlled on Coreg,  Avapro, spironolactone  HLD Zetia and statin.  TR[facility] will followup again tomorrow if patient still in house.   Physical Exam: Blood pressure 133/61, pulse 73, temperature 97.8 F (36.6 C), temperature source Axillary, resp. rate 18, height 6' (1.829 m), weight 154.042 kg (339 lb 9.6 oz), SpO2 99 %. Filed Vitals:   12/04/14 0430  BP: 133/61  Pulse: 73  Temp: 97.8 F (36.6 C)  Resp: 18     General:  well-developed, obese, pleasant male in no apparent distress  Eyes:  sclerae clear  Neck: Thick without lymphadenopathy, supple  Cardiovascular: regular rate and rhythm, no frank murmurs rubs or  gallops.  Respiratory: clear to auscultation, no increased work of breathing  Abdomen: obese, nontender, nondistended, positive bowel sounds, no masses  Skin: No lesions or bruises.  Musculoskeletal: right lower extremity with surgical incision over the knee, bandage appears clean and dry.  Psychiatric: alert and oriented, cooperative, well-groomed.  Labs on Admission:  Basic Metabolic Panel:  Recent Labs Lab 11/30/14 0630 12/01/14 0554  NA 136 132*  K 4.7 4.7  CL 100 101  CO2 27 21  GLUCOSE 237* 262*  BUN 29* 19  CREATININE 1.53* 1.16  CALCIUM 9.1 8.9   CBC:  Recent Labs Lab 11/29/14 1038 11/30/14 0630 12/01/14 0554 12/02/14 0631  WBC  --  5.9 7.6 8.6  HGB  --  10.7* 11.2* 10.7*  HCT  --  32.8* 32.8* 32.6*  MCV  --  93.4 89.9 91.8  PLT 84* 81* 78* 116*   CBG:  Recent Labs Lab 12/03/14 2052 12/04/14 0023 12/04/14 0206 12/04/14 0414 12/04/14 0603  GLUCAP 230* 181* 186* 167* 160*    Costin M. Cruzita Lederer, MD [facility] [phone]  If 7PM-7AM, please contact night-coverage [email or web address] Password [email or web address] 12/04/2014, 8:01 AM

## 2014-12-04 NOTE — Progress Notes (Signed)
Physical Therapy Treatment Patient Details Name: Robert Gregory MRN: [id number] DOB: 03-Feb-1941 Today's Date: 12/04/2014    History of Present Illness Patient is a 74 yo male admitted 11/29/14 now s/p Rt TKA.  PMH:  CAD, MI, ICM, ICD placement, Afib, HTN, CABG, HLD, OSA, spinal stenosis, laminectomy, DM, peripheral neuropathy, CHF, arthritis, s/p Lt TKA, rotator cuff repair    PT Comments    Pt at supervision level for all mobility at this time. Reviewed car transfer technique and HEP. Pt is safe from mobility standpoint to D/C home.   Follow Up Recommendations  Home health PT;Supervision/Assistance - 24 hour     Equipment Recommendations  None recommended by PT    Recommendations for Other Services       Precautions / Restrictions Precautions Precautions: Knee Precaution Comments: reviewed importance of no pillow/bent knee at rest Required Braces or Orthoses: Knee Immobilizer - Right Knee Immobilizer - Right: On when out of bed or walking Restrictions Weight Bearing Restrictions: Yes RLE Weight Bearing: Weight bearing as tolerated    Mobility  Bed Mobility               General bed mobility comments: up in chair  Transfers Overall transfer level: Needs assistance Equipment used: Rolling walker (2 wheeled) Transfers: Sit to/from Stand Sit to Stand: Supervision         General transfer comment: supervision for safety  Ambulation/Gait Ambulation/Gait assistance: Supervision Ambulation Distance (Feet): 130 Feet Assistive device: Rolling walker (2 wheeled) Gait Pattern/deviations: Step-through pattern;Trunk flexed;Wide base of support Gait velocity: decr/guarded Gait velocity interpretation: Below normal speed for age/gender General Gait Details: min cues for upright posture; pt at supervision level; wife ambulating along with; 1 standing rest break due to fatigue    Stairs            Wheelchair Mobility    Modified Rankin (Stroke Patients Only)        Balance Overall balance assessment: No apparent balance deficits (not formally assessed) (requires RW for ambulation )                                  Cognition Arousal/Alertness: Awake/alert Behavior During Therapy: WFL for tasks assessed/performed Overall Cognitive Status: Within Functional Limits for tasks assessed                      Exercises Total Joint Exercises Ankle Circles/Pumps: AROM;Both;10 reps;Seated Quad Sets: AROM;Right;10 reps Hip ABduction/ADduction: AAROM;Right;10 reps Long Arc Quad: AROM;Right;10 reps;Seated Goniometric ROM: 0 to 85 in sitting     General Comments General comments (skin integrity, edema, etc.): reviewed car transfer technique and HEP       Pertinent Vitals/Pain Pain Assessment: 0-10 Pain Score: 4  Pain Location: Rt knee Pain Descriptors / Indicators: Sore Pain Intervention(s): Monitored during session;Repositioned;Premedicated before session    Home Living                      Prior Function            PT Goals (current goals can now be found in the care plan section) Acute Rehab PT Goals Patient Stated Goal: im ready to go home  PT Goal Formulation: With patient/family Time For Goal Achievement: 12/06/14 Potential to Achieve Goals: Good Progress towards PT goals: Progressing toward goals    Frequency  7X/week    PT Plan Current plan remains appropriate  Co-evaluation             End of Session Equipment Utilized During Treatment: Gait belt;Right knee immobilizer Activity Tolerance: Patient tolerated treatment well Patient left: in chair;with call bell/phone within reach;with family/visitor present     Time: 7955-8316 PT Time Calculation (min) (ACUTE ONLY): 16 min  Charges:  $Gait Training: 8-22 mins $Therapeutic Exercise: 8-22 mins                    G CodesElie Confer [name], [place]  [phone] 12/04/2014, 12:11 PM

## 2014-12-05 DIAGNOSIS — I509 Heart failure, unspecified: Secondary | ICD-10-CM | POA: Diagnosis not present

## 2014-12-05 DIAGNOSIS — Z96651 Presence of right artificial knee joint: Secondary | ICD-10-CM | POA: Diagnosis not present

## 2014-12-05 DIAGNOSIS — Z471 Aftercare following joint replacement surgery: Secondary | ICD-10-CM | POA: Diagnosis not present

## 2014-12-05 DIAGNOSIS — I2510 Atherosclerotic heart disease of native coronary artery without angina pectoris: Secondary | ICD-10-CM | POA: Diagnosis not present

## 2014-12-05 DIAGNOSIS — I10 Essential (primary) hypertension: Secondary | ICD-10-CM | POA: Diagnosis not present

## 2014-12-05 DIAGNOSIS — E1142 Type 2 diabetes mellitus with diabetic polyneuropathy: Secondary | ICD-10-CM | POA: Diagnosis not present

## 2014-12-06 DIAGNOSIS — I509 Heart failure, unspecified: Secondary | ICD-10-CM | POA: Diagnosis not present

## 2014-12-06 DIAGNOSIS — E1142 Type 2 diabetes mellitus with diabetic polyneuropathy: Secondary | ICD-10-CM | POA: Diagnosis not present

## 2014-12-06 DIAGNOSIS — I10 Essential (primary) hypertension: Secondary | ICD-10-CM | POA: Diagnosis not present

## 2014-12-06 DIAGNOSIS — Z96651 Presence of right artificial knee joint: Secondary | ICD-10-CM | POA: Diagnosis not present

## 2014-12-06 DIAGNOSIS — Z471 Aftercare following joint replacement surgery: Secondary | ICD-10-CM | POA: Diagnosis not present

## 2014-12-06 DIAGNOSIS — I2510 Atherosclerotic heart disease of native coronary artery without angina pectoris: Secondary | ICD-10-CM | POA: Diagnosis not present

## 2014-12-09 DIAGNOSIS — I2510 Atherosclerotic heart disease of native coronary artery without angina pectoris: Secondary | ICD-10-CM | POA: Diagnosis not present

## 2014-12-09 DIAGNOSIS — I509 Heart failure, unspecified: Secondary | ICD-10-CM | POA: Diagnosis not present

## 2014-12-09 DIAGNOSIS — E1142 Type 2 diabetes mellitus with diabetic polyneuropathy: Secondary | ICD-10-CM | POA: Diagnosis not present

## 2014-12-09 DIAGNOSIS — Z471 Aftercare following joint replacement surgery: Secondary | ICD-10-CM | POA: Diagnosis not present

## 2014-12-09 DIAGNOSIS — I10 Essential (primary) hypertension: Secondary | ICD-10-CM | POA: Diagnosis not present

## 2014-12-09 DIAGNOSIS — Z96651 Presence of right artificial knee joint: Secondary | ICD-10-CM | POA: Diagnosis not present

## 2014-12-10 DIAGNOSIS — I509 Heart failure, unspecified: Secondary | ICD-10-CM | POA: Diagnosis not present

## 2014-12-10 DIAGNOSIS — Z96651 Presence of right artificial knee joint: Secondary | ICD-10-CM | POA: Diagnosis not present

## 2014-12-10 DIAGNOSIS — I10 Essential (primary) hypertension: Secondary | ICD-10-CM | POA: Diagnosis not present

## 2014-12-10 DIAGNOSIS — E1142 Type 2 diabetes mellitus with diabetic polyneuropathy: Secondary | ICD-10-CM | POA: Diagnosis not present

## 2014-12-10 DIAGNOSIS — I2510 Atherosclerotic heart disease of native coronary artery without angina pectoris: Secondary | ICD-10-CM | POA: Diagnosis not present

## 2014-12-10 DIAGNOSIS — Z471 Aftercare following joint replacement surgery: Secondary | ICD-10-CM | POA: Diagnosis not present

## 2014-12-11 DIAGNOSIS — E1142 Type 2 diabetes mellitus with diabetic polyneuropathy: Secondary | ICD-10-CM | POA: Diagnosis not present

## 2014-12-11 DIAGNOSIS — I2510 Atherosclerotic heart disease of native coronary artery without angina pectoris: Secondary | ICD-10-CM | POA: Diagnosis not present

## 2014-12-11 DIAGNOSIS — I10 Essential (primary) hypertension: Secondary | ICD-10-CM | POA: Diagnosis not present

## 2014-12-11 DIAGNOSIS — I509 Heart failure, unspecified: Secondary | ICD-10-CM | POA: Diagnosis not present

## 2014-12-11 DIAGNOSIS — Z96651 Presence of right artificial knee joint: Secondary | ICD-10-CM | POA: Diagnosis not present

## 2014-12-11 DIAGNOSIS — Z471 Aftercare following joint replacement surgery: Secondary | ICD-10-CM | POA: Diagnosis not present

## 2014-12-12 DIAGNOSIS — M1711 Unilateral primary osteoarthritis, right knee: Secondary | ICD-10-CM | POA: Diagnosis not present

## 2014-12-13 DIAGNOSIS — E1142 Type 2 diabetes mellitus with diabetic polyneuropathy: Secondary | ICD-10-CM | POA: Diagnosis not present

## 2014-12-13 DIAGNOSIS — Z96651 Presence of right artificial knee joint: Secondary | ICD-10-CM | POA: Diagnosis not present

## 2014-12-13 DIAGNOSIS — Z471 Aftercare following joint replacement surgery: Secondary | ICD-10-CM | POA: Diagnosis not present

## 2014-12-13 DIAGNOSIS — I509 Heart failure, unspecified: Secondary | ICD-10-CM | POA: Diagnosis not present

## 2014-12-13 DIAGNOSIS — I2510 Atherosclerotic heart disease of native coronary artery without angina pectoris: Secondary | ICD-10-CM | POA: Diagnosis not present

## 2014-12-13 DIAGNOSIS — I10 Essential (primary) hypertension: Secondary | ICD-10-CM | POA: Diagnosis not present

## 2014-12-16 DIAGNOSIS — I509 Heart failure, unspecified: Secondary | ICD-10-CM | POA: Diagnosis not present

## 2014-12-16 DIAGNOSIS — I2510 Atherosclerotic heart disease of native coronary artery without angina pectoris: Secondary | ICD-10-CM | POA: Diagnosis not present

## 2014-12-16 DIAGNOSIS — I10 Essential (primary) hypertension: Secondary | ICD-10-CM | POA: Diagnosis not present

## 2014-12-16 DIAGNOSIS — Z471 Aftercare following joint replacement surgery: Secondary | ICD-10-CM | POA: Diagnosis not present

## 2014-12-16 DIAGNOSIS — E1142 Type 2 diabetes mellitus with diabetic polyneuropathy: Secondary | ICD-10-CM | POA: Diagnosis not present

## 2014-12-16 DIAGNOSIS — Z96651 Presence of right artificial knee joint: Secondary | ICD-10-CM | POA: Diagnosis not present

## 2014-12-18 DIAGNOSIS — I10 Essential (primary) hypertension: Secondary | ICD-10-CM | POA: Diagnosis not present

## 2014-12-18 DIAGNOSIS — I509 Heart failure, unspecified: Secondary | ICD-10-CM | POA: Diagnosis not present

## 2014-12-18 DIAGNOSIS — E1142 Type 2 diabetes mellitus with diabetic polyneuropathy: Secondary | ICD-10-CM | POA: Diagnosis not present

## 2014-12-18 DIAGNOSIS — Z96651 Presence of right artificial knee joint: Secondary | ICD-10-CM | POA: Diagnosis not present

## 2014-12-18 DIAGNOSIS — I2510 Atherosclerotic heart disease of native coronary artery without angina pectoris: Secondary | ICD-10-CM | POA: Diagnosis not present

## 2014-12-18 DIAGNOSIS — Z471 Aftercare following joint replacement surgery: Secondary | ICD-10-CM | POA: Diagnosis not present

## 2014-12-20 DIAGNOSIS — I509 Heart failure, unspecified: Secondary | ICD-10-CM | POA: Diagnosis not present

## 2014-12-20 DIAGNOSIS — E1142 Type 2 diabetes mellitus with diabetic polyneuropathy: Secondary | ICD-10-CM | POA: Diagnosis not present

## 2014-12-20 DIAGNOSIS — Z96651 Presence of right artificial knee joint: Secondary | ICD-10-CM | POA: Diagnosis not present

## 2014-12-20 DIAGNOSIS — I2510 Atherosclerotic heart disease of native coronary artery without angina pectoris: Secondary | ICD-10-CM | POA: Diagnosis not present

## 2014-12-20 DIAGNOSIS — Z471 Aftercare following joint replacement surgery: Secondary | ICD-10-CM | POA: Diagnosis not present

## 2014-12-20 DIAGNOSIS — I10 Essential (primary) hypertension: Secondary | ICD-10-CM | POA: Diagnosis not present

## 2014-12-23 DIAGNOSIS — E1142 Type 2 diabetes mellitus with diabetic polyneuropathy: Secondary | ICD-10-CM | POA: Diagnosis not present

## 2014-12-23 DIAGNOSIS — I509 Heart failure, unspecified: Secondary | ICD-10-CM | POA: Diagnosis not present

## 2014-12-23 DIAGNOSIS — Z471 Aftercare following joint replacement surgery: Secondary | ICD-10-CM | POA: Diagnosis not present

## 2014-12-23 DIAGNOSIS — I10 Essential (primary) hypertension: Secondary | ICD-10-CM | POA: Diagnosis not present

## 2014-12-23 DIAGNOSIS — Z96651 Presence of right artificial knee joint: Secondary | ICD-10-CM | POA: Diagnosis not present

## 2014-12-23 DIAGNOSIS — I2510 Atherosclerotic heart disease of native coronary artery without angina pectoris: Secondary | ICD-10-CM | POA: Diagnosis not present

## 2014-12-25 DIAGNOSIS — I10 Essential (primary) hypertension: Secondary | ICD-10-CM | POA: Diagnosis not present

## 2014-12-25 DIAGNOSIS — I2510 Atherosclerotic heart disease of native coronary artery without angina pectoris: Secondary | ICD-10-CM | POA: Diagnosis not present

## 2014-12-25 DIAGNOSIS — E1142 Type 2 diabetes mellitus with diabetic polyneuropathy: Secondary | ICD-10-CM | POA: Diagnosis not present

## 2014-12-25 DIAGNOSIS — Z471 Aftercare following joint replacement surgery: Secondary | ICD-10-CM | POA: Diagnosis not present

## 2014-12-25 DIAGNOSIS — I509 Heart failure, unspecified: Secondary | ICD-10-CM | POA: Diagnosis not present

## 2014-12-25 DIAGNOSIS — Z96651 Presence of right artificial knee joint: Secondary | ICD-10-CM | POA: Diagnosis not present

## 2014-12-27 DIAGNOSIS — I2510 Atherosclerotic heart disease of native coronary artery without angina pectoris: Secondary | ICD-10-CM | POA: Diagnosis not present

## 2014-12-27 DIAGNOSIS — Z96651 Presence of right artificial knee joint: Secondary | ICD-10-CM | POA: Diagnosis not present

## 2014-12-27 DIAGNOSIS — E1142 Type 2 diabetes mellitus with diabetic polyneuropathy: Secondary | ICD-10-CM | POA: Diagnosis not present

## 2014-12-27 DIAGNOSIS — I10 Essential (primary) hypertension: Secondary | ICD-10-CM | POA: Diagnosis not present

## 2014-12-27 DIAGNOSIS — Z471 Aftercare following joint replacement surgery: Secondary | ICD-10-CM | POA: Diagnosis not present

## 2014-12-27 DIAGNOSIS — I509 Heart failure, unspecified: Secondary | ICD-10-CM | POA: Diagnosis not present

## 2014-12-30 DIAGNOSIS — I509 Heart failure, unspecified: Secondary | ICD-10-CM | POA: Diagnosis not present

## 2014-12-30 DIAGNOSIS — Z471 Aftercare following joint replacement surgery: Secondary | ICD-10-CM | POA: Diagnosis not present

## 2014-12-30 DIAGNOSIS — E1142 Type 2 diabetes mellitus with diabetic polyneuropathy: Secondary | ICD-10-CM | POA: Diagnosis not present

## 2014-12-30 DIAGNOSIS — I2510 Atherosclerotic heart disease of native coronary artery without angina pectoris: Secondary | ICD-10-CM | POA: Diagnosis not present

## 2014-12-30 DIAGNOSIS — Z96651 Presence of right artificial knee joint: Secondary | ICD-10-CM | POA: Diagnosis not present

## 2014-12-30 DIAGNOSIS — I10 Essential (primary) hypertension: Secondary | ICD-10-CM | POA: Diagnosis not present

## 2015-01-01 DIAGNOSIS — Z471 Aftercare following joint replacement surgery: Secondary | ICD-10-CM | POA: Diagnosis not present

## 2015-01-01 DIAGNOSIS — Z96651 Presence of right artificial knee joint: Secondary | ICD-10-CM | POA: Diagnosis not present

## 2015-01-01 DIAGNOSIS — E1142 Type 2 diabetes mellitus with diabetic polyneuropathy: Secondary | ICD-10-CM | POA: Diagnosis not present

## 2015-01-01 DIAGNOSIS — I2510 Atherosclerotic heart disease of native coronary artery without angina pectoris: Secondary | ICD-10-CM | POA: Diagnosis not present

## 2015-01-01 DIAGNOSIS — I10 Essential (primary) hypertension: Secondary | ICD-10-CM | POA: Diagnosis not present

## 2015-01-01 DIAGNOSIS — I509 Heart failure, unspecified: Secondary | ICD-10-CM | POA: Diagnosis not present

## 2015-01-03 DIAGNOSIS — Z96651 Presence of right artificial knee joint: Secondary | ICD-10-CM | POA: Diagnosis not present

## 2015-01-03 DIAGNOSIS — I10 Essential (primary) hypertension: Secondary | ICD-10-CM | POA: Diagnosis not present

## 2015-01-03 DIAGNOSIS — I509 Heart failure, unspecified: Secondary | ICD-10-CM | POA: Diagnosis not present

## 2015-01-03 DIAGNOSIS — Z471 Aftercare following joint replacement surgery: Secondary | ICD-10-CM | POA: Diagnosis not present

## 2015-01-03 DIAGNOSIS — E1142 Type 2 diabetes mellitus with diabetic polyneuropathy: Secondary | ICD-10-CM | POA: Diagnosis not present

## 2015-01-03 DIAGNOSIS — I2510 Atherosclerotic heart disease of native coronary artery without angina pectoris: Secondary | ICD-10-CM | POA: Diagnosis not present

## 2015-01-13 NOTE — Telephone Encounter (Signed)
ERROR

## 2015-01-14 DIAGNOSIS — M1711 Unilateral primary osteoarthritis, right knee: Secondary | ICD-10-CM | POA: Diagnosis not present

## 2015-01-16 ENCOUNTER — Ambulatory Visit: Payer: Medicare Other | Attending: Orthopaedic Surgery | Admitting: Physical Therapy

## 2015-01-16 ENCOUNTER — Encounter: Payer: Self-pay | Admitting: Physical Therapy

## 2015-01-16 DIAGNOSIS — R262 Difficulty in walking, not elsewhere classified: Secondary | ICD-10-CM | POA: Insufficient documentation

## 2015-01-16 DIAGNOSIS — M25561 Pain in right knee: Secondary | ICD-10-CM

## 2015-01-16 DIAGNOSIS — M25661 Stiffness of right knee, not elsewhere classified: Secondary | ICD-10-CM | POA: Insufficient documentation

## 2015-01-16 NOTE — Therapy (Signed)
[facility] [address]  [place], [place], [address] Phone: [phone]   Fax:  [phone]  Physical Therapy Evaluation  Patient Details  Name: Robert Gregory MRN: [id number] Date of Birth: 02-May-1941 Referring Provider:  Leandrew Koyanagi, MD  Encounter Date: 01/16/2015      PT End of Session - 01/16/15 1031    Visit Number 1   Number of Visits 8   Date for PT Re-Evaluation 02/13/15   PT Start Time 1020   PT Stop Time 1103   PT Time Calculation (min) 43 min      Past Medical History  Diagnosis Date  . CAD (coronary artery disease)   . Atrial fibrillation     takes Xarelto daily  . Hyperlipidemia     takes Sanmina-SCI daily  . Obstructive sleep apnea   . Nephrolithiasis   . Thrombocytopenia   . Cholelithiasis   . Spinal stenosis   . ICD (implantable cardioverter-defibrillator) battery depletion   . Ischemic cardiomyopathy   . Congestive heart failure     takes Spironolactone daily  . Hypertension     takes Benicar and Coreg daily  . Myocardial infarction 1997    on the OR table   . Complication of anesthesia     forgetful for about 2-3wks after anesthesia  . Pneumonia     as a child  . Peripheral neuropathy   . Arthritis   . Joint pain   . Chronic back pain     scoliosis/arthritis  . History of colon polyps   . Urinary frequency   . Urinary urgency   . History of kidney stones   . Diabetes mellitus     takes Invokamet daily as well as Hum U    Past Surgical History  Procedure Laterality Date  . Coronary artery bypass and graft  1997    x 5  . Right rotator cuff surgery    . Total knee arthroplasty Left   . Laminectomy    . Tonsillectomy      adenoids  . Icd placed    . Colonoscopy    . Esophagogastroduodenoscopy    . Lithotripsy    . Total knee arthroplasty Right 11/29/2014    Procedure: RIGHT TOTAL KNEE ARTHROPLASTY;  Surgeon: Leandrew Koyanagi, MD;  Location: [facility];   Service: Orthopedics;  Laterality: Right;    There were no vitals filed for this visit.  Visit Diagnosis:  Knee stiffness, right  Recurrent knee pain, right  Difficulty walking      Subjective Assessment - 01/16/15 1024    Subjective pt s/p R TKA on 11/29/14.  He has been participating in [place] since that time 3x/wk.  He progressed from RW to [facility] 2-3 weeks ago and progressed to only needing SPC for uneven terrain over the past week.  cc=stiffness (especially in AM).  Currently notes pain mostly in the AM and some later in that day but feels good throughout most of the.  Is performing HEP daily.   How long can you stand comfortably? 10-15 minutes   How long can you walk comfortably? able to shop 15 minutes with shopping cart as AD, without AD limited to 5 minutes due to fatigue and ache/pain.   Patient Stated Goals improved mobility with less pain and improved balance   Currently in Pain? No/denies   Pain Score --  pain 3/10 at worst noted in AM.   Pain Location Knee  Pain Orientation Right;Anterior   Aggravating Factors  prolonged standing or walking, upon waking in AM   Pain Relieving Factors movement, rest, ice   Multiple Pain Sites No            [facility] PT Assessment - 01/16/15 0001    Assessment   Medical Diagnosis s/p R TKA   Onset Date 11/29/14   Balance Screen   Has the patient fallen in the past 6 months No   Has the patient had a decrease in activity level because of a fear of falling?  No   Is the patient reluctant to leave their home because of a fear of falling?  No   Home Environment   Living Enviornment Private residence   Living Arrangements Spouse/significant other   Type of [place] Access Level entry   [place] One level   Prior [place] typically enjoys walking miles/day but currently unable, otherwise denies regular exercise   Observation/Other Assessments   Focus on Therapeutic Outcomes (FOTO)  58% limitation   Functional Tests    Functional tests Squat   Squat   Comments large wt shift to L noting B knee pain, depth limited to approx 40% parallel   ROM / Strength   AROM / PROM / Strength AROM;PROM;Strength   AROM   AROM Assessment Site Knee   Right/Left Knee Right   Right Knee Extension 5   Right Knee Flexion 115   PROM   Overall PROM Comments R Knee 3-122   PROM Assessment Site --   Strength   Strength Assessment Site Knee;Hip   Right/Left Hip Right   Right Hip Flexion 4+/5   Right Hip Extension 4+/5   Right Hip External Rotation  --  4/5 - no pain   Right Hip Internal Rotation  --  5/5   Right Hip ABduction 5/5   Right/Left Knee Right   Right Knee Flexion 4/5  mild posterior knee pain   Right Knee Extension 4/5  no pain   Flexibility   Soft Tissue Assessment /Muscle Length yes       TODAY'S TREATMENT: Seated HS stretch, Seated R Knee ext self mobes (instruct and perform) Supine HS Stretch by PT                    PT Education - 01/16/15 1105    Education provided Yes   Education Details addition to HHPT HEP   Person(s) Educated Patient   Methods Explanation;Demonstration;Handout   Comprehension Verbalized understanding;Returned demonstration          PT Short Term Goals - 01/16/15 1525    PT SHORT TERM GOAL #1   Title pt independent with initial HEP by 01/24/15   Status New   PT SHORT TERM GOAL #2   Title perform Berg by 01/24/15   Status New           PT Long Term Goals - 01/16/15 1525    PT LONG TERM GOAL #1   Title pt able to walk with normal mechanics over level and uneven terrain without need for AD up to 20 minutes or greater by 02/13/15   Status New   PT LONG TERM GOAL #2   Title R Knee AROM 0-120 by 02/13/15   Status New   PT LONG TERM GOAL #3   Title pt able to perform shallow squat and sit<->stand transfers with equal wt to r/l LE by 02/13/15   Status  New   PT LONG TERM GOAL #4   Title Pt scores 49/56 or better on Berg by 02/13/15   Status New                Plan - 2015-02-11 1521    Clinical Impression Statement Pt s/p R TKA from 11/29/14.  He has progressed well with HHPT with Knee Flexion, slight lag in TKE, mildly impaired gait, but difficulty with squats and concerned about balance.  No staris in his home.   Pt will benefit from skilled therapeutic intervention in order to improve on the following deficits Decreased range of motion;Pain;Decreased strength;Decreased balance;Abnormal gait;Difficulty walking;Impaired flexibility   Rehab Potential Excellent   PT Frequency 2x / week   PT Duration 4 weeks   PT Treatment/Interventions Manual techniques;Gait training;Balance training;Therapeutic exercise;Therapeutic activities;Cryotherapy;Electrical Stimulation   PT Next Visit Plan focus on TKE, CKC strengthening, gait, and balance   Consulted and Agree with Plan of Care Patient          G-Codes - 02-11-15 1033    Functional Assessment Tool Used FOTO 58% limitation   Functional Limitation Mobility: Walking and moving around   Mobility: Walking and Moving Around Current Status [phone]) At least 40 percent but less than 60 percent impaired, limited or restricted   Mobility: Walking and Moving Around Goal Status [phone]) At least 20 percent but less than 40 percent impaired, limited or restricted       Problem List Patient Active Problem List   Diagnosis Date Noted  . Diabetes mellitus 12/03/2014  . Total knee replacement status 11/29/2014  . Pre-operative cardiovascular examination 11/20/2014  . CAD (coronary artery disease) 11/07/2013  . Atrial fibrillation 11/07/2013  . ICD (implantable cardioverter-defibrillator) in place 11/07/2013  . Essential hypertension 11/07/2013  . Hyperlipidemia 11/07/2013  . Cardiomyopathy, ischemic 11/07/2013    [facility] PT, OCS 02-11-15, 3:28 PM  [facility] [address]  [place], [place], [address] Phone: [phone]    Fax:  [phone]

## 2015-01-22 ENCOUNTER — Ambulatory Visit: Payer: Medicare Other | Attending: Orthopaedic Surgery | Admitting: Physical Therapy

## 2015-01-22 DIAGNOSIS — M25661 Stiffness of right knee, not elsewhere classified: Secondary | ICD-10-CM | POA: Diagnosis not present

## 2015-01-22 DIAGNOSIS — M25561 Pain in right knee: Secondary | ICD-10-CM | POA: Diagnosis not present

## 2015-01-22 DIAGNOSIS — R609 Edema, unspecified: Secondary | ICD-10-CM

## 2015-01-22 DIAGNOSIS — R262 Difficulty in walking, not elsewhere classified: Secondary | ICD-10-CM | POA: Diagnosis not present

## 2015-01-22 NOTE — Therapy (Signed)
[facility] [address]  [place], [place], [address] Phone: [phone]   Fax:  [phone]  Physical Therapy Treatment  Patient Details  Name: Robert Gregory MRN: [id number] Date of Birth: [birth year] Referring Provider:  Leandrew Koyanagi, MD  Encounter Date: 01/22/2015      PT End of Session - 01/22/15 1453    Visit Number 2   Number of Visits 8   Date for PT Re-Evaluation 02/13/15   PT Start Time 1450   PT Stop Time 1545   PT Time Calculation (min) 55 min      Past Medical History  Diagnosis Date  . CAD (coronary artery disease)   . Atrial fibrillation     takes Xarelto daily  . Hyperlipidemia     takes Sanmina-SCI daily  . Obstructive sleep apnea   . Nephrolithiasis   . Thrombocytopenia   . Cholelithiasis   . Spinal stenosis   . ICD (implantable cardioverter-defibrillator) battery depletion   . Ischemic cardiomyopathy   . Congestive heart failure     takes Spironolactone daily  . Hypertension     takes Benicar and Coreg daily  . Myocardial infarction 1997    on the OR table   . Complication of anesthesia     forgetful for about 2-3wks after anesthesia  . Pneumonia     as a child  . Peripheral neuropathy   . Arthritis   . Joint pain   . Chronic back pain     scoliosis/arthritis  . History of colon polyps   . Urinary frequency   . Urinary urgency   . History of kidney stones   . Diabetes mellitus     takes Invokamet daily as well as Hum U    Past Surgical History  Procedure Laterality Date  . Coronary artery bypass and graft  1997    x 5  . Right rotator cuff surgery    . Total knee arthroplasty Left   . Laminectomy    . Tonsillectomy      adenoids  . Icd placed    . Colonoscopy    . Esophagogastroduodenoscopy    . Lithotripsy    . Total knee arthroplasty Right 11/29/2014    Procedure: RIGHT TOTAL KNEE ARTHROPLASTY;  Surgeon: Leandrew Koyanagi, MD;  Location: [facility];   Service: Orthopedics;  Laterality: Right;    There were no vitals filed for this visit.  Visit Diagnosis:  Difficulty walking  Recurrent knee pain, right  Knee stiffness, right  Edema      Subjective Assessment - 01/22/15 1453    Subjective States he has been performing HEP and states they are helpful.  States continues to note minimal to no R knee pain.   Currently in Pain? No/denies              TODAY'S TREATMENT: TherEx - NuStep lvl 4, 4' Bridge 15x Bridge with March and no UE assist 10x 8" step toe-tapping 10x each 8" step toe-tapping standing on foam pad 10x (initially with B pole assist but last 6 without assist) 8" ALT Step-up with B Pole A 10x (no "real pain" but required poles due to balance and strength) Foam Pad Narrow Standing 30" EO and 30" with EC (close SBA) Foam Pad Staggered Standing 30" with close SBA (one LOB at start but otherwise performed well) TRX DL Squat 15x (tends to shift to L, VC and TC to avoid) Knee Flexion  Machine 20# DL Concentric, R Eccentric 2x12 Knee Extension Machine 15# DL Concentric, R Eccentric 2x12  Manual - R patellar mobes grade 3 medial/lateral, R knee AP mobes grade 3, and STM to lateral joint line all with goal of decreasing pitting edema located lateral knee joint line behind patella.  Vasopneumatic Compression - R Knee, Medium pressure, 39 dg, 15'                      PT Short Term Goals - 01/22/15 1535    PT SHORT TERM GOAL #1   Title pt independent with initial HEP by 01/24/15   Status Achieved   PT SHORT TERM GOAL #2   Title perform Berg by 01/24/15   Status On-going           PT Long Term Goals - 01/22/15 1535    PT LONG TERM GOAL #1   Title pt able to walk with normal mechanics over level and uneven terrain without need for AD up to 20 minutes or greater by 02/13/15   Status On-going   PT LONG TERM GOAL #2   Title R Knee AROM 0-120 by 02/13/15   Status On-going   PT LONG TERM GOAL #3    Title pt able to perform shallow squat and sit<->stand transfers with equal wt to r/l LE by 02/13/15   Status On-going   PT LONG TERM GOAL #4   Title Pt scores 49/56 or better on Berg by 02/13/15   Status On-going               Plan - 01/22/15 1537    Clinical Impression Statement pt tolerated today's treatment well without c/o pain.  Mildly impaired balance noted with balance training.  Strong tendency to ER R LE with majority of exericses noted.  Pitting edema noted to lateral R Knee joint line.   PT Next Visit Plan focus on TKE, CKC strengthening, gait, and balance.  Manual and modalities for edema.  Possible hip stretch/strengthen to couner ER tendency   Consulted and Agree with Plan of Care Patient        Problem List Patient Active Problem List   Diagnosis Date Noted  . Diabetes mellitus 12/03/2014  . Total knee replacement status 11/29/2014  . Pre-operative cardiovascular examination 11/20/2014  . CAD (coronary artery disease) 11/07/2013  . Atrial fibrillation 11/07/2013  . ICD (implantable cardioverter-defibrillator) in place 11/07/2013  . Essential hypertension 11/07/2013  . Hyperlipidemia 11/07/2013  . Cardiomyopathy, ischemic 11/07/2013    Nancie Bocanegra PT, OCS 01/22/2015, 4:04 PM  [facility] [address]  [place], [place], [address] Phone: [phone]   Fax:  [phone]

## 2015-01-24 ENCOUNTER — Ambulatory Visit: Payer: Medicare Other | Admitting: Rehabilitation

## 2015-01-24 DIAGNOSIS — R262 Difficulty in walking, not elsewhere classified: Secondary | ICD-10-CM | POA: Diagnosis not present

## 2015-01-24 DIAGNOSIS — M25561 Pain in right knee: Secondary | ICD-10-CM | POA: Diagnosis not present

## 2015-01-24 DIAGNOSIS — R609 Edema, unspecified: Secondary | ICD-10-CM

## 2015-01-24 DIAGNOSIS — M25661 Stiffness of right knee, not elsewhere classified: Secondary | ICD-10-CM

## 2015-01-24 NOTE — Therapy (Signed)
[facility] [address]  [place], [place], [address] Phone: [phone]   Fax:  [phone]  Physical Therapy Treatment  Patient Details  Name: Robert Gregory MRN: [id number] Date of Birth: July 15, 1941 Referring Provider:  Leandrew Koyanagi, MD  Encounter Date: 01/24/2015      PT End of Session - 01/24/15 1013    Visit Number 3   Number of Visits 8   Date for PT Re-Evaluation 02/13/15   PT Start Time 7416   PT Stop Time 3845   PT Time Calculation (min) 44 min      Past Medical History  Diagnosis Date  . CAD (coronary artery disease)   . Atrial fibrillation     takes Xarelto daily  . Hyperlipidemia     takes Sanmina-SCI daily  . Obstructive sleep apnea   . Nephrolithiasis   . Thrombocytopenia   . Cholelithiasis   . Spinal stenosis   . ICD (implantable cardioverter-defibrillator) battery depletion   . Ischemic cardiomyopathy   . Congestive heart failure     takes Spironolactone daily  . Hypertension     takes Benicar and Coreg daily  . Myocardial infarction 1997    on the OR table   . Complication of anesthesia     forgetful for about 2-3wks after anesthesia  . Pneumonia     as a child  . Peripheral neuropathy   . Arthritis   . Joint pain   . Chronic back pain     scoliosis/arthritis  . History of colon polyps   . Urinary frequency   . Urinary urgency   . History of kidney stones   . Diabetes mellitus     takes Invokamet daily as well as Hum U    Past Surgical History  Procedure Laterality Date  . Coronary artery bypass and graft  1997    x 5  . Right rotator cuff surgery    . Total knee arthroplasty Left   . Laminectomy    . Tonsillectomy      adenoids  . Icd placed    . Colonoscopy    . Esophagogastroduodenoscopy    . Lithotripsy    . Total knee arthroplasty Right 11/29/2014    Procedure: RIGHT TOTAL KNEE ARTHROPLASTY;  Surgeon: Leandrew Koyanagi, MD;  Location: [facility];   Service: Orthopedics;  Laterality: Right;    There were no vitals filed for this visit.  Visit Diagnosis:  Difficulty walking  Recurrent knee pain, right  Knee stiffness, right  Edema      Subjective Assessment - 01/24/15 1017    Subjective Reports last time was a workout and that it took him a couple days to get back to normal. States there was a some pain but not extreme. Pt states there is mainly stiffness today, no pain.    Currently in Pain? No/denies        TODAY'S TREATMENT: TherEx - NuS tep lvl 4, 5' Standing TKE with Ball against wall 10x5" TRX DL Squat 15x Bridge 15x SAQ 5# 15x3"  SLR 0# 10x Slow March on Blue Foam 10x bil pole assist Alternating Hip Abd on Blue Foam 10x bil pole assist 8" alt Step-up with bil pole assist Seated Fitter leg press 2 Black 15x  Manual - R patellar mobes grade 3 medial/lateral, and STM to lateral joint line all with goal of decreasing pitting edema located lateral knee joint line behind patella.  PT Short Term Goals - 01/22/15 1535    PT SHORT TERM GOAL #1   Title pt independent with initial HEP by 01/24/15   Status Achieved   PT SHORT TERM GOAL #2   Title perform Berg by 01/24/15   Status On-going           PT Long Term Goals - 01/22/15 1535    PT LONG TERM GOAL #1   Title pt able to walk with normal mechanics over level and uneven terrain without need for AD up to 20 minutes or greater by 02/13/15   Status On-going   PT LONG TERM GOAL #2   Title R Knee AROM 0-120 by 02/13/15   Status On-going   PT LONG TERM GOAL #3   Title pt able to perform shallow squat and sit<->stand transfers with equal wt to r/l LE by 02/13/15   Status On-going   PT LONG TERM GOAL #4   Title Pt scores 49/56 or better on Berg by 02/13/15   Status On-going               Plan - 01/24/15 1058    Clinical Impression Statement no complaint of pain with todays exercises, still very off balance with blue foam activities. And still  required cues to prevent weightshift to the Lt with squats.    PT Next Visit Plan focus on TKE, CKC strengthening, gait, and balance.  Manual and modalities for edema.  Possible hip stretch/strengthen to couner ER tendency   Consulted and Agree with Plan of Care Patient        Problem List Patient Active Problem List   Diagnosis Date Noted  . Diabetes mellitus 12/03/2014  . Total knee replacement status 11/29/2014  . Pre-operative cardiovascular examination 11/20/2014  . CAD (coronary artery disease) 11/07/2013  . Atrial fibrillation 11/07/2013  . ICD (implantable cardioverter-defibrillator) in place 11/07/2013  . Essential hypertension 11/07/2013  . Hyperlipidemia 11/07/2013  . Cardiomyopathy, ischemic 11/07/2013    Barbette Hair, PTA 01/24/2015, 11:00 AM  [facility] [address]  [place], [place], [address] Phone: [phone]   Fax:  [phone]

## 2015-01-27 ENCOUNTER — Ambulatory Visit: Payer: Medicare Other | Admitting: Physical Therapy

## 2015-01-27 DIAGNOSIS — M25561 Pain in right knee: Secondary | ICD-10-CM

## 2015-01-27 DIAGNOSIS — R262 Difficulty in walking, not elsewhere classified: Secondary | ICD-10-CM

## 2015-01-27 DIAGNOSIS — M25661 Stiffness of right knee, not elsewhere classified: Secondary | ICD-10-CM

## 2015-01-27 DIAGNOSIS — R609 Edema, unspecified: Secondary | ICD-10-CM

## 2015-01-27 NOTE — Therapy (Signed)
[facility] [address]  [place], [place], [address] Phone: [phone]   Fax:  [phone]  Physical Therapy Treatment  Patient Details  Name: Robert Gregory MRN: [id number] Date of Birth: 08-27-1941 Referring Provider:  Leandrew Koyanagi, MD  Encounter Date: 01/27/2015      PT End of Session - 01/27/15 1029    Visit Number 4   Number of Visits 8   Date for PT Re-Evaluation 02/13/15   PT Start Time 68      Past Medical History  Diagnosis Date  . CAD (coronary artery disease)   . Atrial fibrillation     takes Xarelto daily  . Hyperlipidemia     takes Sanmina-SCI daily  . Obstructive sleep apnea   . Nephrolithiasis   . Thrombocytopenia   . Cholelithiasis   . Spinal stenosis   . ICD (implantable cardioverter-defibrillator) battery depletion   . Ischemic cardiomyopathy   . Congestive heart failure     takes Spironolactone daily  . Hypertension     takes Benicar and Coreg daily  . Myocardial infarction 1997    on the OR table   . Complication of anesthesia     forgetful for about 2-3wks after anesthesia  . Pneumonia     as a child  . Peripheral neuropathy   . Arthritis   . Joint pain   . Chronic back pain     scoliosis/arthritis  . History of colon polyps   . Urinary frequency   . Urinary urgency   . History of kidney stones   . Diabetes mellitus     takes Invokamet daily as well as Hum U    Past Surgical History  Procedure Laterality Date  . Coronary artery bypass and graft  1997    x 5  . Right rotator cuff surgery    . Total knee arthroplasty Left   . Laminectomy    . Tonsillectomy      adenoids  . Icd placed    . Colonoscopy    . Esophagogastroduodenoscopy    . Lithotripsy    . Total knee arthroplasty Right 11/29/2014    Procedure: RIGHT TOTAL KNEE ARTHROPLASTY;  Surgeon: Leandrew Koyanagi, MD;  Location: [facility];  Service: Orthopedics;  Laterality: Right;    There were  no vitals filed for this visit.  Visit Diagnosis:  Difficulty walking  Recurrent knee pain, right  Knee stiffness, right  Edema      Subjective Assessment - 01/27/15 1028    Subjective Very little pain, mostly stiffness in that AM which decreases once up for a while.   Currently in Pain? Yes   Pain Score --  1-2/10   Pain Location Knee   Pain Orientation Right;Anterior            [facility] PT Assessment - 01/27/15 0001    Standardized Balance Assessment   Standardized Balance Assessment Berg Balance Test   Berg Balance Test   Sit to Stand Able to stand without using hands and stabilize independently   Standing Unsupported Able to stand safely 2 minutes   Sitting with Back Unsupported but Feet Supported on Floor or Stool Able to sit safely and securely 2 minutes   Stand to Sit Sits safely with minimal use of hands   Transfers Able to transfer safely, minor use of hands   Standing Unsupported with Eyes Closed Able to stand 10 seconds with supervision  wobble  Standing Ubsupported with Feet Together Able to place feet together independently and stand 1 minute safely   From Standing, Reach Forward with Outstretched Arm Can reach forward >12 cm safely (5")   From Standing Position, Pick up Object from [place] to pick up shoe, needs supervision   From Standing Position, Turn to Look Behind Over each Shoulder Looks behind from both sides and weight shifts well   Turn 360 Degrees Able to turn 360 degrees safely but slowly   Standing Unsupported, Alternately Place Feet on Step/Stool Able to complete 4 steps without aid or supervision  toe caught edge of step on last rep   Standing Unsupported, One Foot in Front Able to plae foot ahead of the other independently and hold 30 seconds   Standing on One Leg Able to lift leg independently and hold equal to or more than 3 seconds   Total Score 46    TODAY'S TREATMENT TherEx - NuStep lvl 5, 5' Bridge 15x Stretch B HS, Piri, and into  Hip IR. Bridge with Black TB 15x BOSU Step-up (UP) - single pole A 10x each MiniTramp - Heel/Toe 20x TRX Squat 15x  BERG Assessment: 46/56  Vasopnuematic Compression - R Knee, Medium Pressure, 15', 38dg           PT Short Term Goals - 01/27/15 1109    PT SHORT TERM GOAL #1   Title pt independent with initial HEP by 01/24/15   Status Achieved   PT SHORT TERM GOAL #2   Title perform Berg by 01/24/15   Status Achieved           PT Long Term Goals - 01/27/15 1109    PT LONG TERM GOAL #1   Title pt able to walk with normal mechanics over level and uneven terrain without need for AD up to 20 minutes or greater by 02/13/15   Status On-going   PT LONG TERM GOAL #2   Title R Knee AROM 0-120 by 02/13/15   Status On-going   PT LONG TERM GOAL #3   Title pt able to perform shallow squat and sit<->stand transfers with equal wt to r/l LE by 02/13/15   Status On-going   PT LONG TERM GOAL #4   Title Pt scores 49/56 or better on Berg by 02/13/15   Status On-going               Plan - 01/27/15 1111    Clinical Impression Statement Mild balance impairement noted with Merrilee Jansky. Significant B HS tightness noted (SLR around 45 degrees).  Pt states stretching today seemed very beneficial.   PT Next Visit Plan Balance Training, TKE and CKC strengthening, HS and hip stretches.   Consulted and Agree with Plan of Care Patient        Problem List Patient Active Problem List   Diagnosis Date Noted  . Diabetes mellitus 12/03/2014  . Total knee replacement status 11/29/2014  . Pre-operative cardiovascular examination 11/20/2014  . CAD (coronary artery disease) 11/07/2013  . Atrial fibrillation 11/07/2013  . ICD (implantable cardioverter-defibrillator) in place 11/07/2013  . Essential hypertension 11/07/2013  . Hyperlipidemia 11/07/2013  . Cardiomyopathy, ischemic 11/07/2013    Laurens Matheny PT, OCS 01/27/2015, 11:13 AM  [facility] [address]  [place], [place], [address] Phone: [phone]   Fax:  [phone]

## 2015-01-29 ENCOUNTER — Ambulatory Visit: Payer: Medicare Other | Admitting: Rehabilitation

## 2015-01-29 DIAGNOSIS — R262 Difficulty in walking, not elsewhere classified: Secondary | ICD-10-CM

## 2015-01-29 DIAGNOSIS — R609 Edema, unspecified: Secondary | ICD-10-CM

## 2015-01-29 DIAGNOSIS — M25661 Stiffness of right knee, not elsewhere classified: Secondary | ICD-10-CM

## 2015-01-29 DIAGNOSIS — M25561 Pain in right knee: Secondary | ICD-10-CM

## 2015-01-29 NOTE — Therapy (Signed)
[facility] [address]  [place], [place], [address] Phone: [phone]   Fax:  [phone]  Physical Therapy Treatment  Patient Details  Name: Robert Gregory MRN: [id number] Date of Birth: 1940-12-01 Referring Provider:  Leandrew Koyanagi, MD  Encounter Date: 01/29/2015      PT End of Session - 01/29/15 1019    Visit Number 5   Number of Visits 8   Date for PT Re-Evaluation 02/13/15   PT Start Time 1016   PT Stop Time 1112   PT Time Calculation (min) 56 min      Past Medical History  Diagnosis Date  . CAD (coronary artery disease)   . Atrial fibrillation     takes Xarelto daily  . Hyperlipidemia     takes Sanmina-SCI daily  . Obstructive sleep apnea   . Nephrolithiasis   . Thrombocytopenia   . Cholelithiasis   . Spinal stenosis   . ICD (implantable cardioverter-defibrillator) battery depletion   . Ischemic cardiomyopathy   . Congestive heart failure     takes Spironolactone daily  . Hypertension     takes Benicar and Coreg daily  . Myocardial infarction 1997    on the OR table   . Complication of anesthesia     forgetful for about 2-3wks after anesthesia  . Pneumonia     as a child  . Peripheral neuropathy   . Arthritis   . Joint pain   . Chronic back pain     scoliosis/arthritis  . History of colon polyps   . Urinary frequency   . Urinary urgency   . History of kidney stones   . Diabetes mellitus     takes Invokamet daily as well as Hum U    Past Surgical History  Procedure Laterality Date  . Coronary artery bypass and graft  1997    x 5  . Right rotator cuff surgery    . Total knee arthroplasty Left   . Laminectomy    . Tonsillectomy      adenoids  . Icd placed    . Colonoscopy    . Esophagogastroduodenoscopy    . Lithotripsy    . Total knee arthroplasty Right 11/29/2014    Procedure: RIGHT TOTAL KNEE ARTHROPLASTY;  Surgeon: Leandrew Koyanagi, MD;  Location: [facility];   Service: Orthopedics;  Laterality: Right;    There were no vitals filed for this visit.  Visit Diagnosis:  Difficulty walking  Recurrent knee pain, right  Knee stiffness, right  Edema      Subjective Assessment - 01/29/15 1017    Subjective Feeling a little more stiff today, thinks Monday session was harder than normal.    Currently in Pain? Yes   Pain Score 3    Pain Location Knee   Pain Orientation Right;Anterior      TODAY'S TREATMENT TherEx - NuStep lvl 5, 5' Bridge 15x Stretch B HS, Piri, and into Hip IR SLR 0# 10x, 2# 10x BOSU Step-up (UP) 12x each with single pole A TRX DL Squats 15x Standing TKE with Ball against wall 15x5" Seated Fitter leg press 2 Black 15x2 Knee Extension Machine 15# DL Concentric, R Eccentric 2x12  Vasopnuematic Compression - R Knee, Medium Pressure, 15', 38dg           PT Short Term Goals - 01/27/15 1109    PT SHORT TERM GOAL #1   Title pt independent with initial HEP by 01/24/15  Status Achieved   PT SHORT TERM GOAL #2   Title perform Berg by 01/24/15   Status Achieved           PT Long Term Goals - 01/27/15 1109    PT LONG TERM GOAL #1   Title pt able to walk with normal mechanics over level and uneven terrain without need for AD up to 20 minutes or greater by 02/13/15   Status On-going   PT LONG TERM GOAL #2   Title R Knee AROM 0-120 by 02/13/15   Status On-going   PT LONG TERM GOAL #3   Title pt able to perform shallow squat and sit<->stand transfers with equal wt to r/l LE by 02/13/15   Status On-going   PT LONG TERM GOAL #4   Title Pt scores 49/56 or better on Berg by 02/13/15   Status On-going               Plan - 01/29/15 1058    Clinical Impression Statement Increased soreness today but pt able to tolerate all exercises well. Difficulty noted with balance activities and improved weightshift during squats.    PT Next Visit Plan Balance Training, TKE and CKC strengthening, HS and hip stretches.         Problem List Patient Active Problem List   Diagnosis Date Noted  . Diabetes mellitus 12/03/2014  . Total knee replacement status 11/29/2014  . Pre-operative cardiovascular examination 11/20/2014  . CAD (coronary artery disease) 11/07/2013  . Atrial fibrillation 11/07/2013  . ICD (implantable cardioverter-defibrillator) in place 11/07/2013  . Essential hypertension 11/07/2013  . Hyperlipidemia 11/07/2013  . Cardiomyopathy, ischemic 11/07/2013    Barbette Hair, PTA 01/29/2015, 11:00 AM  [facility] [address]  [place], [place], [address] Phone: [phone]   Fax:  [phone]

## 2015-02-03 ENCOUNTER — Ambulatory Visit: Payer: Medicare Other | Admitting: Physical Therapy

## 2015-02-03 DIAGNOSIS — M25561 Pain in right knee: Secondary | ICD-10-CM | POA: Diagnosis not present

## 2015-02-03 DIAGNOSIS — R262 Difficulty in walking, not elsewhere classified: Secondary | ICD-10-CM

## 2015-02-03 DIAGNOSIS — M25661 Stiffness of right knee, not elsewhere classified: Secondary | ICD-10-CM | POA: Diagnosis not present

## 2015-02-03 DIAGNOSIS — R609 Edema, unspecified: Secondary | ICD-10-CM

## 2015-02-03 NOTE — Therapy (Signed)
[facility] [address]  [place], [place], [address] Phone: [phone]   Fax:  [phone]  Physical Therapy Treatment  Patient Details  Name: Robert Gregory MRN: [id number] Date of Birth: 04-Oct-1940 Referring Provider:  Leandrew Koyanagi, MD  Encounter Date: 02/03/2015      PT End of Session - 02/03/15 1417    Visit Number 6   Number of Visits 8   Date for PT Re-Evaluation 02/13/15   PT Start Time 1410   PT Stop Time 1503   PT Time Calculation (min) 53 min      Past Medical History  Diagnosis Date  . CAD (coronary artery disease)   . Atrial fibrillation     takes Xarelto daily  . Hyperlipidemia     takes Sanmina-SCI daily  . Obstructive sleep apnea   . Nephrolithiasis   . Thrombocytopenia   . Cholelithiasis   . Spinal stenosis   . ICD (implantable cardioverter-defibrillator) battery depletion   . Ischemic cardiomyopathy   . Congestive heart failure     takes Spironolactone daily  . Hypertension     takes Benicar and Coreg daily  . Myocardial infarction 1997    on the OR table   . Complication of anesthesia     forgetful for about 2-3wks after anesthesia  . Pneumonia     as a child  . Peripheral neuropathy   . Arthritis   . Joint pain   . Chronic back pain     scoliosis/arthritis  . History of colon polyps   . Urinary frequency   . Urinary urgency   . History of kidney stones   . Diabetes mellitus     takes Invokamet daily as well as Hum U    Past Surgical History  Procedure Laterality Date  . Coronary artery bypass and graft  1997    x 5  . Right rotator cuff surgery    . Total knee arthroplasty Left   . Laminectomy    . Tonsillectomy      adenoids  . Icd placed    . Colonoscopy    . Esophagogastroduodenoscopy    . Lithotripsy    . Total knee arthroplasty Right 11/29/2014    Procedure: RIGHT TOTAL KNEE ARTHROPLASTY;  Surgeon: Leandrew Koyanagi, MD;  Location: [facility];   Service: Orthopedics;  Laterality: Right;    There were no vitals filed for this visit.  Visit Diagnosis:  Difficulty walking  Recurrent knee pain, right  Knee stiffness, right  Edema      Subjective Assessment - 02/03/15 1414    Subjective States has been more sore than typical since last treatment.   How long can you walk comfortably? 10 minutes without AD, 20 minutes with SPC   Currently in Pain? Yes   Pain Score 3    Pain Location Knee   Pain Orientation Right         TODAY'S TREATMENT TherEx - Rec Bike lvl 1, 3' (did not like bike) Firefighter B HS, Piri, and into Hip IR Bridge with Alt Knee Ext 4x (Stopped due to increasing knee pain) Foam Pad Narrow Standing EO 30", 30" EC - performed well, only required SBA TRX Squat while standing on BOSU (down) 2x10 Standing on BOSU (down) EC with TRX A 20" (very difficult, intermittent Min A to prevent falls) Standing Hip ABD and Flexion with Double Green TB and SLS (intermittent single pole A)  10x each  Vasopnuematic Compression - R Knee, Medium Pressure, 15', 38dg             PT Short Term Goals - 01/27/15 1109    PT SHORT TERM GOAL #1   Title pt independent with initial HEP by 01/24/15   Status Achieved   PT SHORT TERM GOAL #2   Title perform Berg by 01/24/15   Status Achieved           PT Long Term Goals - 02/03/15 1457    PT LONG TERM GOAL #1   Title pt able to walk with normal mechanics over level and uneven terrain without need for AD up to 20 minutes or greater by 02/13/15  10 minutes without AD   Status On-going               Plan - 02/03/15 1453    Clinical Impression Statement performed well for most part.  Had to stop bridge with alt knee ext due to increasing knee pain and rec bike not well tolerated.  Balance activities incorporated with strengthening went well.   PT Next Visit Plan Balance Training, TKE and CKC strengthening, HS and hip stretches, 2 more visits on current POC  d/c vs renew?   Consulted and Agree with Plan of Care Patient        Problem List Patient Active Problem List   Diagnosis Date Noted  . Diabetes mellitus 12/03/2014  . Total knee replacement status 11/29/2014  . Pre-operative cardiovascular examination 11/20/2014  . CAD (coronary artery disease) 11/07/2013  . Atrial fibrillation 11/07/2013  . ICD (implantable cardioverter-defibrillator) in place 11/07/2013  . Essential hypertension 11/07/2013  . Hyperlipidemia 11/07/2013  . Cardiomyopathy, ischemic 11/07/2013    [facility] PT, OCS 02/03/2015, 2:58 PM  [facility] [address]  [place], [place], [address] Phone: [phone]   Fax:  [phone]

## 2015-02-05 ENCOUNTER — Ambulatory Visit: Payer: Medicare Other | Admitting: Physical Therapy

## 2015-02-05 DIAGNOSIS — M25561 Pain in right knee: Secondary | ICD-10-CM

## 2015-02-05 DIAGNOSIS — M25661 Stiffness of right knee, not elsewhere classified: Secondary | ICD-10-CM | POA: Diagnosis not present

## 2015-02-05 DIAGNOSIS — R609 Edema, unspecified: Secondary | ICD-10-CM

## 2015-02-05 DIAGNOSIS — R262 Difficulty in walking, not elsewhere classified: Secondary | ICD-10-CM | POA: Diagnosis not present

## 2015-02-05 NOTE — Therapy (Signed)
[facility] [address]  [place], [place], [address] Phone: [phone]   Fax:  [phone]  Physical Therapy Treatment  Patient Details  Name: Robert Gregory MRN: [id number] Date of Birth: Aug 30, 1941 Referring Provider:  Leandrew Koyanagi, MD  Encounter Date: 02/05/2015      PT End of Session - 02/05/15 1107    Visit Number 7   Number of Visits 8   Date for PT Re-Evaluation 02/13/15   PT Start Time 1025   PT Stop Time 1120   PT Time Calculation (min) 55 min      Past Medical History  Diagnosis Date  . CAD (coronary artery disease)   . Atrial fibrillation     takes Xarelto daily  . Hyperlipidemia     takes Sanmina-SCI daily  . Obstructive sleep apnea   . Nephrolithiasis   . Thrombocytopenia   . Cholelithiasis   . Spinal stenosis   . ICD (implantable cardioverter-defibrillator) battery depletion   . Ischemic cardiomyopathy   . Congestive heart failure     takes Spironolactone daily  . Hypertension     takes Benicar and Coreg daily  . Myocardial infarction 1997    on the OR table   . Complication of anesthesia     forgetful for about 2-3wks after anesthesia  . Pneumonia     as a child  . Peripheral neuropathy   . Arthritis   . Joint pain   . Chronic back pain     scoliosis/arthritis  . History of colon polyps   . Urinary frequency   . Urinary urgency   . History of kidney stones   . Diabetes mellitus     takes Invokamet daily as well as Hum U    Past Surgical History  Procedure Laterality Date  . Coronary artery bypass and graft  1997    x 5  . Right rotator cuff surgery    . Total knee arthroplasty Left   . Laminectomy    . Tonsillectomy      adenoids  . Icd placed    . Colonoscopy    . Esophagogastroduodenoscopy    . Lithotripsy    . Total knee arthroplasty Right 11/29/2014    Procedure: RIGHT TOTAL KNEE ARTHROPLASTY;  Surgeon: Leandrew Koyanagi, MD;  Location: [facility];   Service: Orthopedics;  Laterality: Right;    There were no vitals filed for this visit.  Visit Diagnosis:  Difficulty walking  Recurrent knee pain, right  Knee stiffness, right  Edema      Subjective Assessment - 02/05/15 1027    Subjective improved pain since last treatment stating is pain-free today.   Currently in Pain? No/denies         TODAY'S TREATMENT TherEx - NuStep Lvl 5, 5' Bridge 15x Stretch B HS, Piri, and into Hip IR  Manual - Contract/Relax R Knee Flexion and Extension  8" FW Step-up with Single Pole A 12x Standing Hip ABD, Flexion, and Exension with Double Green TB and Single Pole A Stepping over 6" foam rollers (2) FW with step-through 3x's each then side stepping (step-to) 3x's each Monster Walk FW and BW Green TB at ankles 30' each Foam Beam Side Stepping 3 laps each  Vasopnuematic Compression - R Knee, Medium Pressure, 15', 38dg         PT Short Term Goals - 01/27/15 1109    PT SHORT TERM GOAL #1   Title pt  independent with initial HEP by 01/24/15   Status Achieved   PT SHORT TERM GOAL #2   Title perform Berg by 01/24/15   Status Achieved           PT Long Term Goals - 02/03/15 1457    PT LONG TERM GOAL #1   Title pt able to walk with normal mechanics over level and uneven terrain without need for AD up to 20 minutes or greater by 02/13/15  10 minutes without AD   Status On-going               Plan - 02/05/15 1108    Clinical Impression Statement Good performance with treatment.  Is walking well and tolerating exercises well.  POC is up at next visit, may meet all goal by then but may need 1-2 more weeks.   PT Next Visit Plan assess goal status and ROM   Consulted and Agree with Plan of Care Patient        Problem List Patient Active Problem List   Diagnosis Date Noted  . Diabetes mellitus 12/03/2014  . Total knee replacement status 11/29/2014  . Pre-operative cardiovascular examination 11/20/2014  . CAD (coronary  artery disease) 11/07/2013  . Atrial fibrillation 11/07/2013  . ICD (implantable cardioverter-defibrillator) in place 11/07/2013  . Essential hypertension 11/07/2013  . Hyperlipidemia 11/07/2013  . Cardiomyopathy, ischemic 11/07/2013    Otniel Hoe PT, OCS 02/05/2015, 11:11 AM  [facility] [address]  [place], [place], [address] Phone: [phone]   Fax:  [phone]

## 2015-02-10 ENCOUNTER — Ambulatory Visit: Payer: Medicare Other | Admitting: Rehabilitation

## 2015-02-10 DIAGNOSIS — R262 Difficulty in walking, not elsewhere classified: Secondary | ICD-10-CM | POA: Diagnosis not present

## 2015-02-10 DIAGNOSIS — M25561 Pain in right knee: Secondary | ICD-10-CM | POA: Diagnosis not present

## 2015-02-10 DIAGNOSIS — M25661 Stiffness of right knee, not elsewhere classified: Secondary | ICD-10-CM | POA: Diagnosis not present

## 2015-02-10 DIAGNOSIS — R609 Edema, unspecified: Secondary | ICD-10-CM

## 2015-02-10 NOTE — Therapy (Signed)
[facility] [address]  [place], [place], [address] Phone: [phone]   Fax:  [phone]  Physical Therapy Treatment  Patient Details  Name: Robert Gregory MRN: [id number] Date of Birth: July 02, 1941 Referring Provider:  Leandrew Koyanagi, MD  Encounter Date: 02/10/2015      PT End of Session - 02/10/15 1107    Visit Number 8   Number of Visits 8   Date for PT Re-Evaluation 02/13/15   PT Start Time 1105   PT Stop Time 1155   PT Time Calculation (min) 50 min      Past Medical History  Diagnosis Date  . CAD (coronary artery disease)   . Atrial fibrillation     takes Xarelto daily  . Hyperlipidemia     takes Sanmina-SCI daily  . Obstructive sleep apnea   . Nephrolithiasis   . Thrombocytopenia   . Cholelithiasis   . Spinal stenosis   . ICD (implantable cardioverter-defibrillator) battery depletion   . Ischemic cardiomyopathy   . Congestive heart failure     takes Spironolactone daily  . Hypertension     takes Benicar and Coreg daily  . Myocardial infarction 1997    on the OR table   . Complication of anesthesia     forgetful for about 2-3wks after anesthesia  . Pneumonia     as a child  . Peripheral neuropathy   . Arthritis   . Joint pain   . Chronic back pain     scoliosis/arthritis  . History of colon polyps   . Urinary frequency   . Urinary urgency   . History of kidney stones   . Diabetes mellitus     takes Invokamet daily as well as Hum U    Past Surgical History  Procedure Laterality Date  . Coronary artery bypass and graft  1997    x 5  . Right rotator cuff surgery    . Total knee arthroplasty Left   . Laminectomy    . Tonsillectomy      adenoids  . Icd placed    . Colonoscopy    . Esophagogastroduodenoscopy    . Lithotripsy    . Total knee arthroplasty Right 11/29/2014    Procedure: RIGHT TOTAL KNEE ARTHROPLASTY;  Surgeon: Leandrew Koyanagi, MD;  Location: [facility];   Service: Orthopedics;  Laterality: Right;    There were no vitals filed for this visit.  Visit Diagnosis:  Difficulty walking  Recurrent knee pain, right  Knee stiffness, right  Edema      Subjective Assessment - 02/10/15 1108    Subjective Reports not pain, just stiffness. Feels as though he is ready to be done.    Currently in Pain? No/denies            [facility] PT Assessment - 02/10/15 1112    Observation/Other Assessments   Focus on Therapeutic Outcomes (FOTO)  54% limited (CK)   ROM / Strength   AROM / PROM / Strength AROM;Strength   AROM   AROM Assessment Site Knee   Right/Left Knee Right   Right Knee Extension 3   Right Knee Flexion 120   Strength   Strength Assessment Site Hip;Knee   Right/Left Hip Right   Right Hip Flexion --  5-/5   Right Hip Extension 4+/5   Right Hip External Rotation  --  4+/5   Right Hip Internal Rotation  --  5/5   Right/Left Knee  Right   Right Knee Flexion --  5-/5   Right Knee Extension 4+/5   Standardized Balance Assessment   Standardized Balance Assessment Berg Balance Test   Berg Balance Test   Sit to Stand Able to stand without using hands and stabilize independently   Standing Unsupported Able to stand safely 2 minutes   Sitting with Back Unsupported but Feet Supported on Floor or Stool Able to sit safely and securely 2 minutes   Stand to Sit Sits safely with minimal use of hands   Transfers Able to transfer safely, minor use of hands   Standing Unsupported with Eyes Closed Able to stand 10 seconds safely   Standing Ubsupported with Feet Together Able to place feet together independently and stand 1 minute safely   From Standing, Reach Forward with Outstretched Arm Can reach forward >12 cm safely (5")   From Standing Position, Pick up Object from Floor Able to pick up shoe safely and easily   From Standing Position, Turn to Look Behind Over each Shoulder Looks behind from both sides and weight shifts well   Turn 360  Degrees Able to turn 360 degrees safely one side only in 4 seconds or less   Standing Unsupported, Alternately Place Feet on Step/Stool Able to stand independently and safely and complete 8 steps in 20 seconds   Standing Unsupported, One Foot in Front Able to place foot tandem independently and hold 30 seconds   Standing on One Leg Able to lift leg independently and hold equal to or more than 3 seconds   Total Score 52      TODAY'S TREATMENT TherEx - NuStep Lvl 5, 5' MMT, ROM, Berg Balance SLS at back counter 5x on floor, 5x on blue foam Bridge 15x Hamstring stretch 3x20" bilateral (manual) 8" FW Step-up with Single Pole A 12x  Vasopnuematic Compression - R Knee, Medium Pressure, 10', 38dg        PT Short Term Goals - 02/10/15 1108    PT SHORT TERM GOAL #1   Title pt independent with initial HEP by 01/24/15   Status Achieved   PT SHORT TERM GOAL #2   Title perform Berg by 01/24/15   Status Achieved           PT Long Term Goals - 02/10/15 1108    PT LONG TERM GOAL #1   Title pt able to walk with normal mechanics over level and uneven terrain without need for AD up to 20 minutes or greater by 02/13/15  Able to walk 20 minutes with cane, 10 minutes without AD.    Status Not Met   PT LONG TERM GOAL #2   Title R Knee AROM 0-120 by 02/13/15  Met flexion portion but not extension   Status Partially Met   PT LONG TERM GOAL #3   Title pt able to perform shallow squat and sit<->stand transfers with equal wt to r/l LE by 02/13/15   Status Achieved   PT LONG TERM GOAL #4   Title Pt scores 49/56 or better on Berg by 02/13/15   Status Achieved               Plan - 02/10/15 1145    Clinical Impression Statement Pt met all goals except for 2 of his LTG's. He reports he feels ready to continue on his own and will be discharged.    PT Next Visit Plan D/C   Consulted and Agree with Plan of Care Patient  G-Codes - 02/10/15 1352    Functional Assessment Tool Used FOTO  54% limitation   Functional Limitation Mobility: Walking and moving around   Mobility: Walking and Moving Around Current Status [phone]) At least 40 percent but less than 60 percent impaired, limited or restricted   Mobility: Walking and Moving Around Goal Status [phone]) At least 20 percent but less than 40 percent impaired, limited or restricted   Mobility: Walking and Moving Around Discharge Status [phone]) At least 40 percent but less than 60 percent impaired, limited or restricted      Problem List Patient Active Problem List   Diagnosis Date Noted  . Diabetes mellitus 12/03/2014  . Total knee replacement status 11/29/2014  . Pre-operative cardiovascular examination 11/20/2014  . CAD (coronary artery disease) 11/07/2013  . Atrial fibrillation 11/07/2013  . ICD (implantable cardioverter-defibrillator) in place 11/07/2013  . Essential hypertension 11/07/2013  . Hyperlipidemia 11/07/2013  . Cardiomyopathy, ischemic 11/07/2013    Barbette Hair, PTA  02/10/2015, 1:54 PM  Gypsy Lore PT, OCS 02/10/2015 1:54 PM   [facility] [address]  [place], [place], [address] Phone: [phone]   Fax:  [phone]

## 2015-02-13 ENCOUNTER — Ambulatory Visit: Payer: Medicare Other | Admitting: Rehabilitation

## 2015-02-19 ENCOUNTER — Encounter: Payer: Self-pay | Admitting: Internal Medicine

## 2015-02-19 ENCOUNTER — Ambulatory Visit (INDEPENDENT_AMBULATORY_CARE_PROVIDER_SITE_OTHER): Payer: Medicare Other | Admitting: *Deleted

## 2015-02-19 DIAGNOSIS — I255 Ischemic cardiomyopathy: Secondary | ICD-10-CM

## 2015-02-19 LAB — CUP PACEART REMOTE DEVICE CHECK
Battery Remaining Longevity: 84 mo
Battery Remaining Percentage: 79 %
Brady Statistic RV Percent Paced: 1 %
Date Time Interrogation Session: 20160601060027
HighPow Impedance: 78 Ohm
HighPow Impedance: 78 Ohm
Lead Channel Impedance Value: 390 Ohm
Lead Channel Impedance Value: 410 Ohm
Lead Channel Pacing Threshold Amplitude: 0.75 V
Lead Channel Sensing Intrinsic Amplitude: 12 mV
Lead Channel Sensing Intrinsic Amplitude: 2.6 mV
Lead Channel Setting Pacing Amplitude: 2 V
MDC IDC MSMT BATTERY VOLTAGE: 2.98 V
MDC IDC MSMT LEADCHNL RV PACING THRESHOLD PULSEWIDTH: 0.5 ms
MDC IDC SET LEADCHNL RV PACING PULSEWIDTH: 0.5 ms
MDC IDC SET LEADCHNL RV SENSING SENSITIVITY: 0.5 mV
Pulse Gen Model: 2411
Pulse Gen Serial Number: 7067730
Zone Setting Detection Interval: 270 ms
Zone Setting Detection Interval: 325 ms

## 2015-02-19 NOTE — Progress Notes (Signed)
Remote ICD transmission.   

## 2015-02-24 LAB — CUP PACEART REMOTE DEVICE CHECK
Battery Remaining Longevity: 84 mo
Battery Remaining Percentage: 79 %
Battery Voltage: 2.98 V
Date Time Interrogation Session: 20160601060027
HighPow Impedance: 78 Ohm
HighPow Impedance: 78 Ohm
Lead Channel Impedance Value: 390 Ohm
Lead Channel Impedance Value: 410 Ohm
Lead Channel Pacing Threshold Amplitude: 0.75 V
Lead Channel Pacing Threshold Pulse Width: 0.5 ms
Lead Channel Sensing Intrinsic Amplitude: 12 mV
Lead Channel Sensing Intrinsic Amplitude: 2.6 mV
Lead Channel Setting Pacing Pulse Width: 0.5 ms
Lead Channel Setting Sensing Sensitivity: 0.5 mV
MDC IDC PG MODEL: 2411
MDC IDC SET LEADCHNL RV PACING AMPLITUDE: 2 V
MDC IDC SET ZONE DETECTION INTERVAL: 270 ms
MDC IDC STAT BRADY RV PERCENT PACED: 1 %
Pulse Gen Serial Number: 7067730
Zone Setting Detection Interval: 325 ms

## 2015-03-10 ENCOUNTER — Encounter: Payer: Self-pay | Admitting: Cardiology

## 2015-03-17 ENCOUNTER — Other Ambulatory Visit: Payer: Self-pay

## 2015-03-18 DIAGNOSIS — M1711 Unilateral primary osteoarthritis, right knee: Secondary | ICD-10-CM | POA: Diagnosis not present

## 2015-04-03 DIAGNOSIS — D696 Thrombocytopenia, unspecified: Secondary | ICD-10-CM | POA: Diagnosis not present

## 2015-04-03 DIAGNOSIS — E1165 Type 2 diabetes mellitus with hyperglycemia: Secondary | ICD-10-CM | POA: Diagnosis not present

## 2015-04-03 DIAGNOSIS — J310 Chronic rhinitis: Secondary | ICD-10-CM | POA: Diagnosis not present

## 2015-04-03 DIAGNOSIS — R600 Localized edema: Secondary | ICD-10-CM | POA: Diagnosis not present

## 2015-04-03 DIAGNOSIS — E559 Vitamin D deficiency, unspecified: Secondary | ICD-10-CM | POA: Diagnosis not present

## 2015-04-03 DIAGNOSIS — D649 Anemia, unspecified: Secondary | ICD-10-CM | POA: Diagnosis not present

## 2015-04-03 DIAGNOSIS — Z794 Long term (current) use of insulin: Secondary | ICD-10-CM | POA: Diagnosis not present

## 2015-04-03 DIAGNOSIS — L309 Dermatitis, unspecified: Secondary | ICD-10-CM | POA: Diagnosis not present

## 2015-04-04 ENCOUNTER — Ambulatory Visit
Admission: RE | Admit: 2015-04-04 | Discharge: 2015-04-04 | Disposition: A | Discharge status: Home / Self Care | Payer: Medicare Other | Source: Ambulatory Visit | Attending: Internal Medicine | Admitting: Internal Medicine

## 2015-04-04 ENCOUNTER — Other Ambulatory Visit: Payer: Self-pay | Admitting: Internal Medicine

## 2015-04-04 DIAGNOSIS — R7989 Other specified abnormal findings of blood chemistry: Secondary | ICD-10-CM

## 2015-04-04 DIAGNOSIS — M7989 Other specified soft tissue disorders: Secondary | ICD-10-CM | POA: Diagnosis not present

## 2015-04-04 DIAGNOSIS — Z471 Aftercare following joint replacement surgery: Secondary | ICD-10-CM | POA: Diagnosis not present

## 2015-04-04 DIAGNOSIS — Z96651 Presence of right artificial knee joint: Secondary | ICD-10-CM | POA: Diagnosis not present

## 2015-05-05 ENCOUNTER — Telehealth: Payer: Self-pay | Admitting: Hematology & Oncology

## 2015-05-05 NOTE — Telephone Encounter (Signed)
Lt mess with referring office [facility]) regarding pt appt on 8/25 and confirmed that pt is aware of appt.

## 2015-05-15 ENCOUNTER — Encounter: Payer: Self-pay | Admitting: Hematology & Oncology

## 2015-05-15 ENCOUNTER — Other Ambulatory Visit (HOSPITAL_BASED_OUTPATIENT_CLINIC_OR_DEPARTMENT_OTHER): Payer: Medicare Other

## 2015-05-15 ENCOUNTER — Ambulatory Visit (HOSPITAL_BASED_OUTPATIENT_CLINIC_OR_DEPARTMENT_OTHER): Payer: Medicare Other | Admitting: Hematology & Oncology

## 2015-05-15 ENCOUNTER — Ambulatory Visit: Payer: Medicare Other

## 2015-05-15 VITALS — BP 152/72 | HR 103 | Temp 98.0°F | Resp 18 | Ht 72.0 in | Wt 326.0 lb

## 2015-05-15 DIAGNOSIS — D696 Thrombocytopenia, unspecified: Secondary | ICD-10-CM | POA: Diagnosis not present

## 2015-05-15 LAB — CBC WITH DIFFERENTIAL (CANCER CENTER ONLY)
BASO#: 0 10*3/uL (ref 0.0–0.2)
BASO%: 0.5 % (ref 0.0–2.0)
EOS ABS: 0.2 10*3/uL (ref 0.0–0.5)
EOS%: 3.9 % (ref 0.0–7.0)
HEMATOCRIT: 44.2 % (ref 38.7–49.9)
HEMOGLOBIN: 14.8 g/dL (ref 13.0–17.1)
LYMPH#: 0.9 10*3/uL (ref 0.9–3.3)
LYMPH%: 15.8 % (ref 14.0–48.0)
MCH: 30 pg (ref 28.0–33.4)
MCHC: 33.5 g/dL (ref 32.0–35.9)
MCV: 90 fL (ref 82–98)
MONO#: 0.5 10*3/uL (ref 0.1–0.9)
MONO%: 9.7 % (ref 0.0–13.0)
NEUT%: 70.1 % (ref 40.0–80.0)
NEUTROS ABS: 3.9 10*3/uL (ref 1.5–6.5)
Platelets: 84 10*3/uL — ABNORMAL LOW (ref 145–400)
RBC: 4.93 10*6/uL (ref 4.20–5.70)
RDW: 17.3 % — ABNORMAL HIGH (ref 11.1–15.7)
WBC: 5.6 10*3/uL (ref 4.0–10.0)

## 2015-05-15 LAB — CHCC SATELLITE - SMEAR

## 2015-05-15 NOTE — Progress Notes (Signed)
Referral MD  Reason for Referral: Chronic thrombocytopenia   Chief Complaint  Patient presents with  . OTHER    New Patient  : I'm here because my platelets are low.  HPI: [name] is a very nice 74 year old white male. 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  He has multiple medical problems. He has an implantable defibrillator. He has had cardiac surgery.  He is on Xarelto. He is on Xarelto because of "atrial fibrillation". He is not sure he even has this. On his last EKG that I see, he has bundle branch block. I will note this might impact how age of her ablation is noted.  He is quite large. He does have sleep apnea.  He's had multiple surgeries. There's never been any bleeding.  Because of his Xarelto use, he's had his blood checked. He has had issues with thrombocytopenia. From his point of view, his thrombocytopenia has gone back for many years. He had a chart that he kept. This went back to 1991. Starting in 2009, his blood count has been chronically below 100,000.  Back in 2001, his platelet count was in the low 100 range.  When he was in [place], he saw a hematologist. He had a bone marrow biopsy done. From what he tells me, this showed immune thrombocytopenia. I he at see how the doctor's note at the time.  He has never required any therapy.  Of note, he has vitiligo. He also has insulin-dependent diabetes.  He does not recall last time he had any type of evaluation for cirrhosis or splenomegaly.  He had a Doppler done of his right leg earlier this year. This was negative for any thromboembolic disease.  He's had no bleeding. He's had no bruising. He may have an occasional bruise.  He's had no fever. He's had no rashes. He's had no headache. He's had no visual problems.  Overall, his performance status is ECOG 1.   Past Medical History  Diagnosis Date  . CAD (coronary artery disease)   . Atrial fibrillation     takes Xarelto daily  .  Hyperlipidemia     takes [employer] daily  . Obstructive sleep apnea   . Nephrolithiasis   . Thrombocytopenia   . Cholelithiasis   . Spinal stenosis   . ICD (implantable cardioverter-defibrillator) battery depletion   . Ischemic cardiomyopathy   . Congestive heart failure     takes Spironolactone daily  . Hypertension     takes Benicar and Coreg daily  . Myocardial infarction 1997    on the OR table   . Complication of anesthesia     forgetful for about 2-3wks after anesthesia  . Pneumonia     as a child  . Peripheral neuropathy   . Arthritis   . Joint pain   . Chronic back pain     scoliosis/arthritis  . History of colon polyps   . Urinary frequency   . Urinary urgency   . History of kidney stones   . Diabetes mellitus     takes Invokamet daily as well as Hum U  :  Past Surgical History  Procedure Laterality Date  . Coronary artery bypass and graft  1997    x 5  . Right rotator cuff surgery    . Total knee arthroplasty Left   . Laminectomy    . Tonsillectomy      adenoids  . Icd placed    . Colonoscopy    .  Esophagogastroduodenoscopy    . Lithotripsy    . Total knee arthroplasty Right 11/29/2014    Procedure: RIGHT TOTAL KNEE ARTHROPLASTY;  Surgeon: Leandrew Koyanagi, MD;  Location: [place];  Service: Orthopedics;  Laterality: Right;  :   Current outpatient prescriptions:  .  amoxicillin (AMOXIL) 500 MG capsule, Take 500 mg by mouth. PRIOR TO DENTAL WORK, Disp: , Rfl:  .  Canagliflozin-Metformin HCl (INVOKAMET) 50-1000 MG TABS, Take 1 tablet by mouth 2 (two) times daily., Disp: , Rfl:  .  carvedilol (COREG) 12.5 MG tablet, Take 1 tablet (12.5 mg total) by mouth 2 (two) times daily with a meal., Disp: 180 tablet, Rfl: 3 .  Cholecalciferol (VITAMIN D PO), Take 5,000 Units by mouth daily., Disp: , Rfl:  .  Cinnamon 500 MG TABS, Take 500 mg by mouth daily. , Disp: , Rfl:  .  clobetasol ointment (TEMOVATE) 0.86 %, Apply 1 application topically as needed.,  Disp: , Rfl:  .  Coenzyme Q10 200 MG TABS, Take 1 tablet by mouth daily., Disp: , Rfl:  .  Cranberry 200 MG CAPS, Take 300 mg by mouth daily. , Disp: , Rfl:  .  ezetimibe (ZETIA) 10 MG tablet, Take 1 tablet (10 mg total) by mouth daily., Disp: 90 tablet, Rfl: 3 .  fenofibrate (TRICOR) 48 MG tablet, Take 1 tablet (48 mg total) by mouth daily., Disp: 90 tablet, Rfl: 3 .  furosemide (LASIX) 40 MG tablet, Take 2 tablets (80 mg total) by mouth 2 (two) times daily. (Patient taking differently: Take 40 mg by mouth 2 (two) times daily. ), Disp: 360 tablet, Rfl: 3 .  HYDROcodone-acetaminophen (NORCO) 7.5-325 MG per tablet, Take 1-2 tablets by mouth every 6 (six) hours as needed for moderate pain., Disp: 90 tablet, Rfl: 0 .  Insulin Regular Human (HUMULIN R U-500, CONCENTRATED, [place]), Inject 18 Units into the skin. 18 U @ BREAKFAST AND 6 TO 8 U @ DINNER, Disp: , Rfl:  .  methocarbamol (ROBAXIN) 750 MG tablet, Take 1 tablet (750 mg total) by mouth 2 (two) times daily as needed for muscle spasms., Disp: 60 tablet, Rfl: 0 .  montelukast (SINGULAIR) 10 MG tablet, Take 10 mg by mouth at bedtime., Disp: , Rfl:  .  Multiple Vitamins-Minerals (CENTRUM SILVER PO), Take 1 tablet by mouth daily., Disp: , Rfl:  .  nitroGLYCERIN (NITROSTAT) 0.4 MG SL tablet, Place 0.4 mg under the tongue every 5 (five) minutes as needed for chest pain., Disp: , Rfl:  .  olmesartan (BENICAR) 40 MG tablet, Take 2 tablets (80 mg total) by mouth daily., Disp: 180 tablet, Rfl: 3 .  omega-3 acid ethyl esters (LOVAZA) 1 G capsule, Take 2 capsules (2 g total) by mouth 2 (two) times daily., Disp: 360 capsule, Rfl: 3 .  polyethylene glycol (MIRALAX / GLYCOLAX) packet, Take 17 g by mouth daily as needed for mild constipation., Disp: 14 each, Rfl: 0 .  potassium chloride (K-DUR) 10 MEQ tablet, Take 2 tablets (20 mEq total) by mouth 2 (two) times daily., Disp: 360 tablet, Rfl: 3 .  pravastatin (PRAVACHOL) 80 MG tablet, Take 1 tablet (80 mg total) by  mouth daily., Disp: 90 tablet, Rfl: 3 .  Probiotic Product (PROBIOTIC DAILY PO), Take 1 tablet by mouth daily., Disp: , Rfl:  .  rivaroxaban (XARELTO) 20 MG TABS tablet, Take 1 tablet (20 mg total) by mouth daily with supper., Disp: 90 tablet, Rfl: 3 .  senna (SENOKOT) 8.6 MG TABS tablet, Take 1 tablet (8.6  mg total) by mouth 2 (two) times daily., Disp: 120 each, Rfl: 0 .  spironolactone (ALDACTONE) 25 MG tablet, Take 1 tablet (25 mg total) by mouth daily., Disp: 90 tablet, Rfl: 3:  :  Allergies  Allergen Reactions  . Adhesive [Tape]     Rash   . Iodine   . Contrast Media [Iodinated Diagnostic Agents] Rash and Other (See Comments)    flushing EXTREME FLUSHING   :  Family History  Problem Relation Age of Onset  . Heart disease      No family history  :  Social History   Social History  . Marital Status: Married    Spouse Name: N/A  . Number of Children: N/A  . Years of Education: N/A   Occupational History  .      Retired from [employer]  . Smoking status: Never Smoker   . Smokeless tobacco: Not on file  . Alcohol Use: 0.0 oz/week    0 Standard drinks or equivalent per week     Comment: once drink a month  . Drug Use: No  . Sexual Activity: Not on file   Other Topics Concern  . Not on file   Social History Narrative  :  Pertinent items are noted in HPI.  Exam: @IPVITALS @  obese white male in no obvious distress. Vital signs show a temperature of 98. Pulse 103. Blood pressure 152/72. Weight is 326 pounds. Head and neck exam shows no ocular or oral lesions. He has no palpable cervical or supraclavicular lymph nodes. Lungs are clear to percussion and auscultation bilaterally. Cardiac exam regular rate and rhythm with a normal S1 and S2. He has occasional extra beat. There may be a 1/6 systolic ejection murmur. Axillary exam shows no bilateral axillary adenopathy area abdomen is obese but soft. Has good bowel sounds. There is no fluid  wave. There is no palpable liver or spleen tip. Back exam shows laminectomy scar in the lumbar spine. There is no tenderness over the spine, ribs or hips. Extremities shows some mild chronic nonpitting edema of the lower legs. He has decent range of motion of his joints. He has surgical scars on his knees. Skin exam shows some scattered, and frequent ecchymoses. Neurological exam shows no focal neurological deficits.    Recent Labs  05/15/15 1005  WBC 5.6  HGB 14.8  HCT 44.2  PLT 84 Platelet count consistent in citrate*   No results for input(s): NA, K, CL, CO2, GLUCOSE, BUN, CREATININE, CALCIUM in the last 72 hours.  Blood smear review: Normochromic and normocytic population of red blood cells. There are no nucleated red blood cells. There are no teardrop cells. I see no schistocytes or spherocytes. White cells appear normal in morphology maturation. There is no hypersegmented polys. He has no atypical lymphocytes. He has no immature myeloid or lymphoid cells. Platelets are somewhat decreased in number. Platelets are fairly uniform in size. He may have a few large platelets. Platelets are well granulated.  Pathology: None     Assessment and Plan:  74 year old white male. He has chronic thrombocytopenia. He had a bone marrow test done back in 2002 in [place].  I would have think that mild immune thrombocytopenia would be a likely possibility.  I also have to worry about him with the possibility of NASH and splenic megaly. He is quite stout so I really cannot palpate his liver or spleen. Because of this, I think that it would  be worthwhile getting a ultrasound of his abdomen. This can give [place] a decent look as to whether not there is cirrhosis. He is at significant risk for cirrhosis because of his diabetes.  The fact that he has vitiligo would increase the likelihood of him having immune thrombocytopenia.  I think that I can probably see him back in about 3 months. I want to get an  ultrasound of his abdomen next week. I will call him with the results.  He is a real nice man to talk to.. We talked quite a bit about his work, his duty stations and his overall health issues.  I spent about 45 minutes with him.

## 2015-05-19 ENCOUNTER — Ambulatory Visit (INDEPENDENT_AMBULATORY_CARE_PROVIDER_SITE_OTHER): Payer: Medicare Other | Admitting: Internal Medicine

## 2015-05-19 ENCOUNTER — Encounter: Payer: Self-pay | Admitting: Internal Medicine

## 2015-05-19 ENCOUNTER — Other Ambulatory Visit (INDEPENDENT_AMBULATORY_CARE_PROVIDER_SITE_OTHER): Payer: Medicare Other | Admitting: *Deleted

## 2015-05-19 VITALS — BP 114/60 | HR 76 | Temp 98.5°F | Resp 12 | Ht 72.5 in | Wt 329.8 lb

## 2015-05-19 DIAGNOSIS — N183 Chronic kidney disease, stage 3 unspecified: Secondary | ICD-10-CM

## 2015-05-19 DIAGNOSIS — E1169 Type 2 diabetes mellitus with other specified complication: Secondary | ICD-10-CM | POA: Diagnosis not present

## 2015-05-19 DIAGNOSIS — E1122 Type 2 diabetes mellitus with diabetic chronic kidney disease: Secondary | ICD-10-CM

## 2015-05-19 DIAGNOSIS — Z794 Long term (current) use of insulin: Secondary | ICD-10-CM

## 2015-05-19 DIAGNOSIS — E119 Type 2 diabetes mellitus without complications: Secondary | ICD-10-CM

## 2015-05-19 DIAGNOSIS — I255 Ischemic cardiomyopathy: Secondary | ICD-10-CM | POA: Diagnosis not present

## 2015-05-19 HISTORY — DX: Type 2 diabetes mellitus with diabetic chronic kidney disease: E11.22

## 2015-05-19 HISTORY — DX: Chronic kidney disease, stage 3 unspecified: N18.30

## 2015-05-19 LAB — POCT GLYCOSYLATED HEMOGLOBIN (HGB A1C): Hemoglobin A1C: 7.7

## 2015-05-19 MED ORDER — CANAGLIFLOZIN 100 MG PO TABS: 100.0000 mg | ORAL_TABLET | Freq: Every day | ORAL | Status: DC

## 2015-05-19 MED ORDER — METFORMIN HCL ER 500 MG PO TB24: ORAL_TABLET | ORAL | Status: DC

## 2015-05-19 NOTE — Patient Instructions (Signed)
Please continue: - U500 insulin 18 units before breakfast and 6-8 units before dinner (30 min before)  Stop: - InvokaMet - Cinnamon  Start: - Metformin ER 2000 mg with dinner - Invokana 100 mg in am  Please return in 3 months with your sugar log.   PATIENT INSTRUCTIONS FOR TYPE 2 DIABETES:  **Please join MyChart!** - see attached instructions about how to join if you have not done so already.  DIET AND EXERCISE Diet and exercise is an important part of diabetic treatment.  We recommended aerobic exercise in the form of brisk walking (working between 40-60% of maximal aerobic capacity, similar to brisk walking) for 150 minutes per week (such as 30 minutes five days per week) along with 3 times per week performing 'resistance' training (using various gauge rubber tubes with handles) 5-10 exercises involving the major muscle groups (upper body, lower body and core) performing 10-15 repetitions (or near fatigue) each exercise. Start at half the above goal but build slowly to reach the above goals. If limited by weight, joint pain, or disability, we recommend daily walking in a swimming pool with water up to waist to reduce pressure from joints while allow for adequate exercise.    BLOOD GLUCOSES Monitoring your blood glucoses is important for continued management of your diabetes. Please check your blood glucoses 2-4 times a day: fasting, before meals and at bedtime (you can rotate these measurements - e.g. one day check before the 3 meals, the next day check before 2 of the meals and before bedtime, etc.).   HYPOGLYCEMIA (low blood sugar) Hypoglycemia is usually a reaction to not eating, exercising, or taking too much insulin/ other diabetes drugs.  Symptoms include tremors, sweating, hunger, confusion, headache, etc. Treat IMMEDIATELY with 15 grams of Carbs: . 4 glucose tablets .  cup regular juice/soda . 2 tablespoons raisins . 4 teaspoons sugar . 1 tablespoon honey Recheck blood  glucose in 15 mins and repeat above if still symptomatic/blood glucose <100.  RECOMMENDATIONS TO REDUCE YOUR RISK OF DIABETIC COMPLICATIONS: * Take your prescribed MEDICATION(S) * Follow a DIABETIC diet: Complex carbs, fiber rich foods, (monounsaturated and polyunsaturated) fats * AVOID saturated/trans fats, high fat foods, >2,300 mg salt per day. * EXERCISE at least 5 times a week for 30 minutes or preferably daily.  * DO NOT SMOKE OR DRINK more than 1 drink a day. * Check your FEET every day. Do not wear tightfitting shoes. Contact us if you develop an ulcer * See your EYE doctor once a year or more if needed * Get a FLU shot once a year * Get a PNEUMONIA vaccine once before and once after age 5 years  GOALS:  * Your Hemoglobin A1c of <7%  * fasting sugars need to be <130 * after meals sugars need to be <180 (2h after you start eating) * Your Systolic BP should be 284 or lower  * Your Diastolic BP should be 80 or lower  * Your HDL (Good Cholesterol) should be 40 or higher  * Your LDL (Bad Cholesterol) should be 100 or lower. * Your Triglycerides should be 150 or lower  * Your Urine microalbumin (kidney function) should be <30 * Your Body Mass Index should be 25 or lower    Please consider the following ways to cut down carbs and fat and increase fiber and micronutrients in your diet: - substitute whole grain for white bread or pasta - substitute brown rice for white rice - substitute 90-calorie flat bread  pieces for slices of bread when possible - substitute sweet potatoes or yams for white potatoes - substitute humus for margarine - substitute tofu for cheese when possible - substitute almond or rice milk for regular milk (would not drink soy milk daily due to concern for soy estrogen influence on breast cancer risk) - substitute dark chocolate for other sweets when possible - substitute water - can add lemon or orange slices for taste - for diet sodas (artificial sweeteners  will trick your body that you can eat sweets without getting calories and will lead you to overeating and weight gain in the long run) - do not skip breakfast or other meals (this will slow down the metabolism and will result in more weight gain over time)  - can try smoothies made from fruit and almond/rice milk in am instead of regular breakfast - can also try old-fashioned (not instant) oatmeal made with almond/rice milk in am - order the dressing on the side when eating salad at a restaurant (pour less than half of the dressing on the salad) - eat as little meat as possible - can try juicing, but should not forget that juicing will get rid of the fiber, so would alternate with eating raw veg./fruits or drinking smoothies - use as little oil as possible, even when using olive oil - can dress a salad with a mix of balsamic vinegar and lemon juice, for e.g. - use agave nectar, stevia sugar, or regular sugar rather than artificial sweateners - steam or broil/roast veggies  - snack on veggies/fruit/nuts (unsalted, preferably) when possible, rather than processed foods - reduce or eliminate aspartame in diet (it is in diet sodas, chewing gum, etc) Read the labels!  Try to read Dr. Janene Harvey book: "Program for Reversing Diabetes" for other ideas for healthy eating.

## 2015-05-19 NOTE — Progress Notes (Signed)
Patient ID: Robert Gregory, male   DOB: 09/15/1941, 74 y.o.   MRN: [id number]  HPI: Robert Gregory is a 74 y.o.-year-old male, referred by his PCP, Shamleffer, Herschell Dimes, MD, for management of DM2, dx in 2004, insulin-dependent, uncontrolled, with complications (CAD - Ischemic CMP, s/p CABG x5 in 1997, CHF; CKD stage 3).  Last hemoglobin A1c was: Lab Results  Component Value Date   HGBA1C 7.7 05/19/2015   HGBA1C 8.0* 12/03/2014   HGBA1C 7.4* 11/29/2014   Pt is on a regimen of: - InvokaMet 50-1000 mg 2x a day, with meals - U500 insulin 18 and 6-8 units 15 min before B-D - Cinnamon 500 mg 2x a day He gained 50 lbs with Actos in the past.  Pt checks his sugars 3x a day and they are: - am: 150s - 2h after b'fast: n/c - before lunch: n/c - 2h after lunch: n/c - before dinner: 130s - 2h after dinner: n/c - bedtime: 150-170 (if <120 - eat a snack) - nighttime: n/c No lows. Lowest sugar was 70; he has hypoglycemia awareness at 70.  Highest sugar was 330 (rarely) - after carb-loaded meals.  Glucometer: [employer] 2  Pt's meals are: - Breakfast: cereals + 2% milk, 4 oz OJ - Lunch: high protein, meat + cheese; veg juice - Dinner: meat + veggies + strach - Snacks: 1- pm: peanuts, V8 juice  - + mild CKD, last BUN/creatinine:  Lab Results  Component Value Date   BUN 19 12/01/2014   CREATININE 1.16 12/01/2014  On Benicar 80.  - last set of lipids: Lab Results  Component Value Date   CHOL 123 07/31/2014   HDL 30* 07/31/2014   LDLCALC 46 07/31/2014   TRIG 235* 07/31/2014   CHOLHDL 4.1 07/31/2014  On Pravachol 80. - last eye exam was in 10/2014. No DR.  - no numbness and tingling in his feet.  Pt has no FH of DM.  ROS: Constitutional: no weight gain/loss, + fatigue, + subjective hyperthermia, + excessive urination Eyes: no blurry vision, no xerophthalmia ENT: no sore throat, no nodules palpated in throat, no dysphagia/odynophagia, no hoarseness, +  tinnitus Cardiovascular: no CP/SOB/+ palpitations/+ leg swelling Respiratory: no cough/SOB Gastrointestinal: no N/V/D/+ C Musculoskeletal: no muscle/+ joint aches Skin: + rash, + diff w/ erections Neurological: no tremors/numbness/tingling/dizziness Psychiatric: no depression/anxiety  Past Medical History  Diagnosis Date  . CAD (coronary artery disease)   . Atrial fibrillation     takes Xarelto daily  . Hyperlipidemia     takes Sanmina-SCI daily  . Obstructive sleep apnea   . Nephrolithiasis   . Thrombocytopenia   . Cholelithiasis   . Spinal stenosis   . ICD (implantable cardioverter-defibrillator) battery depletion   . Ischemic cardiomyopathy   . Congestive heart failure     takes Spironolactone daily  . Hypertension     takes Benicar and Coreg daily  . Myocardial infarction 1997    on the OR table   . Complication of anesthesia     forgetful for about 2-3wks after anesthesia  . Pneumonia     as a child  . Peripheral neuropathy   . Arthritis   . Joint pain   . Chronic back pain     scoliosis/arthritis  . History of colon polyps   . Urinary frequency   . Urinary urgency   . History of kidney stones   . Diabetes mellitus     takes Invokamet daily as well as Hum U  Past Surgical History  Procedure Laterality Date  . Coronary artery bypass and graft  1997    x 5  . Right rotator cuff surgery    . Total knee arthroplasty Left   . Laminectomy    . Tonsillectomy      adenoids  . Icd placed    . Colonoscopy    . Esophagogastroduodenoscopy    . Lithotripsy    . Total knee arthroplasty Right 11/29/2014    Procedure: RIGHT TOTAL KNEE ARTHROPLASTY;  Surgeon: Leandrew Koyanagi, MD;  Location: [facility];  Service: Orthopedics;  Laterality: Right;   Social History   Social History  . Marital Status: Married    Spouse Name: N/A  . Number of Children: N/A  . Years of Education: N/A   Occupational History  .      Retired from [employer]  . Smoking status: Never Smoker   . Smokeless tobacco: Not on file  . Alcohol Use: 0.0 oz/week    0 Standard drinks or equivalent per week     Comment: once drink a month  . Drug Use: No  . Sexual Activity: Not on file   Other Topics Concern  . Not on file   Social History Narrative   Current Outpatient Prescriptions on File Prior to Visit  Medication Sig Dispense Refill  . amoxicillin (AMOXIL) 500 MG capsule Take 500 mg by mouth. PRIOR TO DENTAL WORK    . Canagliflozin-Metformin HCl (INVOKAMET) 50-1000 MG TABS Take 1 tablet by mouth 2 (two) times daily.    . carvedilol (COREG) 12.5 MG tablet Take 1 tablet (12.5 mg total) by mouth 2 (two) times daily with a meal. 180 tablet 3  . Cholecalciferol (VITAMIN D PO) Take 5,000 Units by mouth daily.    . Cinnamon 500 MG TABS Take 500 mg by mouth daily.     . clobetasol ointment (TEMOVATE) 9.67 % Apply 1 application topically as needed.    . Coenzyme Q10 200 MG TABS Take 1 tablet by mouth daily.    . Cranberry 200 MG CAPS Take 300 mg by mouth daily.     Marland Kitchen ezetimibe (ZETIA) 10 MG tablet Take 1 tablet (10 mg total) by mouth daily. 90 tablet 3  . fenofibrate (TRICOR) 48 MG tablet Take 1 tablet (48 mg total) by mouth daily. 90 tablet 3  . furosemide (LASIX) 40 MG tablet Take 2 tablets (80 mg total) by mouth 2 (two) times daily. (Patient taking differently: Take 40 mg by mouth 2 (two) times daily. ) 360 tablet 3  . HYDROcodone-acetaminophen (NORCO) 7.5-325 MG per tablet Take 1-2 tablets by mouth every 6 (six) hours as needed for moderate pain. 90 tablet 0  . Insulin Regular Human (HUMULIN R U-500, CONCENTRATED, [place]) Inject 18 Units into the skin. 18 U @ BREAKFAST AND 6 TO 8 U @ DINNER    . methocarbamol (ROBAXIN) 750 MG tablet Take 1 tablet (750 mg total) by mouth 2 (two) times daily as needed for muscle spasms. 60 tablet 0  . montelukast (SINGULAIR) 10 MG tablet Take 10 mg by mouth at bedtime.    . Multiple Vitamins-Minerals  (CENTRUM SILVER PO) Take 1 tablet by mouth daily.    . nitroGLYCERIN (NITROSTAT) 0.4 MG SL tablet Place 0.4 mg under the tongue every 5 (five) minutes as needed for chest pain.    Marland Kitchen olmesartan (BENICAR) 40 MG tablet Take 2 tablets (80 mg total) by mouth daily. [place]  tablet 3  . omega-3 acid ethyl esters (LOVAZA) 1 G capsule Take 2 capsules (2 g total) by mouth 2 (two) times daily. 360 capsule 3  . polyethylene glycol (MIRALAX / GLYCOLAX) packet Take 17 g by mouth daily as needed for mild constipation. 14 each 0  . potassium chloride (K-DUR) 10 MEQ tablet Take 2 tablets (20 mEq total) by mouth 2 (two) times daily. 360 tablet 3  . pravastatin (PRAVACHOL) 80 MG tablet Take 1 tablet (80 mg total) by mouth daily. 90 tablet 3  . Probiotic Product (PROBIOTIC DAILY PO) Take 1 tablet by mouth daily.    . rivaroxaban (XARELTO) 20 MG TABS tablet Take 1 tablet (20 mg total) by mouth daily with supper. 90 tablet 3  . senna (SENOKOT) 8.6 MG TABS tablet Take 1 tablet (8.6 mg total) by mouth 2 (two) times daily. 120 each 0  . spironolactone (ALDACTONE) 25 MG tablet Take 1 tablet (25 mg total) by mouth daily. 90 tablet 3   No current facility-administered medications on file prior to visit.   Allergies  Allergen Reactions  . Adhesive [Tape]     Rash   . Iodine   . Contrast Media [Iodinated Diagnostic Agents] Rash and Other (See Comments)    flushing EXTREME FLUSHING    Family History  Problem Relation Age of Onset  . Heart disease      No family history   PE: BP 114/60 mmHg  Pulse 76  Temp(Src) 98.5 F (36.9 C) (Oral)  Resp 12  Ht 6' 0.5" (1.842 m)  Wt 329 lb 12.8 oz (149.596 kg)  BMI 44.09 kg/m2  SpO2 76% Wt Readings from Last 3 Encounters:  05/19/15 329 lb 12.8 oz (149.596 kg)  05/15/15 326 lb (147.873 kg)  11/29/14 339 lb 9.6 oz (154.042 kg)   Constitutional: obese, in NAD Eyes: PERRLA, EOMI, no exophthalmos ENT: moist mucous membranes, no thyromegaly, no cervical  lymphadenopathy Cardiovascular: RRR, No MRG Respiratory: CTA B Gastrointestinal: abdomen soft, NT, ND, BS+ Musculoskeletal: no deformities, strength intact in all 4 Skin: moist, warm, no rashes Neurological: + tremor with outstretched hands, DTR normal in all 4  ASSESSMENT: 1. DM2, insulin-dependent, uncontrolled, with complications - CAD, s/p CABG 5x in 1997 (Dr Roxy Horseman), iCMP >> CHF (Dr Stanford Breed) - CKD stage 3  PLAN:  1. Patient with long-standing, uncontrolled diabetes, on oral antidiabetic regimen + U500 insulin, with improved control after improving diet (also lost 10 lbs since 11/2014), but still with sugars above target >> will continue U500 and split InvokaMet into Invokana in am and Metformin ER at dinnertime  - to help with am sugars. The sugars throughout the rest of the day are close to goal. - He asks my opinion about cinnamon >> I advised him to stop as he has elevated LFTs and cinnamon contains coumarin with can elevated LFTs. - I suggested to:  Patient Instructions  Please continue: - U500 insulin 18 units before breakfast and 6-8 units before dinner (30 min before)  Stop: - InvokaMet - Cinnamon  Start: - Metformin ER 2000 mg with dinner - Invokana 100 mg in am  Please return in 3 months with your sugar log.   - continue checking sugars at different times of the day - check 3 times a day, rotating checks - given sugar log and advised how to fill it and to bring it at next appt  - given foot care handout and explained the principles  - given instructions for hypoglycemia management "15-15  rule"  - advised for yearly eye exams >> he is UTD - Return to clinic in 3 mo with sugar log

## 2015-05-20 ENCOUNTER — Other Ambulatory Visit: Payer: Self-pay | Admitting: *Deleted

## 2015-05-20 MED ORDER — EMPAGLIFLOZIN 25 MG PO TABS: 25.0000 mg | ORAL_TABLET | Freq: Every day | ORAL | Status: DC

## 2015-05-21 ENCOUNTER — Ambulatory Visit (HOSPITAL_BASED_OUTPATIENT_CLINIC_OR_DEPARTMENT_OTHER)
Admission: RE | Admit: 2015-05-21 | Discharge: 2015-05-21 | Disposition: A | Discharge status: Home / Self Care | Payer: Medicare Other | Source: Ambulatory Visit | Attending: Hematology & Oncology | Admitting: Hematology & Oncology

## 2015-05-21 DIAGNOSIS — K8020 Calculus of gallbladder without cholecystitis without obstruction: Secondary | ICD-10-CM | POA: Diagnosis not present

## 2015-05-21 DIAGNOSIS — D696 Thrombocytopenia, unspecified: Secondary | ICD-10-CM | POA: Diagnosis not present

## 2015-05-21 DIAGNOSIS — N289 Disorder of kidney and ureter, unspecified: Secondary | ICD-10-CM | POA: Diagnosis not present

## 2015-05-21 DIAGNOSIS — R161 Splenomegaly, not elsewhere classified: Secondary | ICD-10-CM | POA: Insufficient documentation

## 2015-05-21 DIAGNOSIS — K828 Other specified diseases of gallbladder: Secondary | ICD-10-CM | POA: Diagnosis not present

## 2015-05-27 ENCOUNTER — Ambulatory Visit (INDEPENDENT_AMBULATORY_CARE_PROVIDER_SITE_OTHER): Payer: Medicare Other | Admitting: *Deleted

## 2015-05-27 DIAGNOSIS — I255 Ischemic cardiomyopathy: Secondary | ICD-10-CM

## 2015-05-27 DIAGNOSIS — M7741 Metatarsalgia, right foot: Secondary | ICD-10-CM | POA: Diagnosis not present

## 2015-05-27 DIAGNOSIS — L84 Corns and callosities: Secondary | ICD-10-CM | POA: Diagnosis not present

## 2015-05-27 DIAGNOSIS — E119 Type 2 diabetes mellitus without complications: Secondary | ICD-10-CM | POA: Diagnosis not present

## 2015-05-27 NOTE — Progress Notes (Signed)
Remote ICD transmission.   

## 2015-06-03 LAB — CUP PACEART REMOTE DEVICE CHECK
Battery Remaining Percentage: 76 %
Battery Voltage: 2.98 V
Brady Statistic RV Percent Paced: 1 %
HIGH POWER IMPEDANCE MEASURED VALUE: 75 Ohm
HighPow Impedance: 75 Ohm
Lead Channel Impedance Value: 430 Ohm
Lead Channel Pacing Threshold Amplitude: 0.75 V
Lead Channel Pacing Threshold Pulse Width: 0.5 ms
Lead Channel Sensing Intrinsic Amplitude: 12 mV
Lead Channel Sensing Intrinsic Amplitude: 2.6 mV
Lead Channel Setting Pacing Amplitude: 2 V
Lead Channel Setting Pacing Pulse Width: 0.5 ms
MDC IDC MSMT BATTERY REMAINING LONGEVITY: 81 mo
MDC IDC MSMT LEADCHNL RA IMPEDANCE VALUE: 390 Ohm
MDC IDC PG MODEL: 2411
MDC IDC SESS DTM: 20160906073803
MDC IDC SET LEADCHNL RV SENSING SENSITIVITY: 0.5 mV
MDC IDC SET ZONE DETECTION INTERVAL: 325 ms
Pulse Gen Serial Number: 7067730
Zone Setting Detection Interval: 270 ms

## 2015-06-04 ENCOUNTER — Encounter: Payer: Self-pay | Admitting: Cardiology

## 2015-06-11 ENCOUNTER — Encounter: Payer: Self-pay | Admitting: Internal Medicine

## 2015-06-12 ENCOUNTER — Encounter: Payer: Self-pay | Admitting: *Deleted

## 2015-06-19 DIAGNOSIS — L570 Actinic keratosis: Secondary | ICD-10-CM | POA: Diagnosis not present

## 2015-06-19 DIAGNOSIS — B078 Other viral warts: Secondary | ICD-10-CM | POA: Diagnosis not present

## 2015-06-19 DIAGNOSIS — D1800 Hemangioma unspecified site: Secondary | ICD-10-CM | POA: Diagnosis not present

## 2015-06-19 DIAGNOSIS — L408 Other psoriasis: Secondary | ICD-10-CM | POA: Diagnosis not present

## 2015-06-19 DIAGNOSIS — L80 Vitiligo: Secondary | ICD-10-CM | POA: Diagnosis not present

## 2015-06-19 DIAGNOSIS — I8311 Varicose veins of right lower extremity with inflammation: Secondary | ICD-10-CM | POA: Diagnosis not present

## 2015-06-19 DIAGNOSIS — L821 Other seborrheic keratosis: Secondary | ICD-10-CM | POA: Diagnosis not present

## 2015-07-04 DIAGNOSIS — I10 Essential (primary) hypertension: Secondary | ICD-10-CM | POA: Diagnosis not present

## 2015-07-04 DIAGNOSIS — Z23 Encounter for immunization: Secondary | ICD-10-CM | POA: Diagnosis not present

## 2015-07-04 DIAGNOSIS — D696 Thrombocytopenia, unspecified: Secondary | ICD-10-CM | POA: Diagnosis not present

## 2015-07-04 DIAGNOSIS — E1165 Type 2 diabetes mellitus with hyperglycemia: Secondary | ICD-10-CM | POA: Diagnosis not present

## 2015-07-04 DIAGNOSIS — E785 Hyperlipidemia, unspecified: Secondary | ICD-10-CM | POA: Diagnosis not present

## 2015-07-04 DIAGNOSIS — E559 Vitamin D deficiency, unspecified: Secondary | ICD-10-CM | POA: Diagnosis not present

## 2015-07-04 DIAGNOSIS — Z794 Long term (current) use of insulin: Secondary | ICD-10-CM | POA: Diagnosis not present

## 2015-07-07 ENCOUNTER — Other Ambulatory Visit: Payer: Self-pay | Admitting: Cardiology

## 2015-07-07 NOTE — Telephone Encounter (Signed)
Rx request sent to pharmacy.  

## 2015-08-06 ENCOUNTER — Other Ambulatory Visit: Payer: Self-pay | Admitting: Cardiology

## 2015-08-14 ENCOUNTER — Other Ambulatory Visit: Payer: Self-pay | Admitting: Cardiology

## 2015-08-25 ENCOUNTER — Ambulatory Visit (INDEPENDENT_AMBULATORY_CARE_PROVIDER_SITE_OTHER): Payer: Medicare Other | Admitting: Internal Medicine

## 2015-08-25 ENCOUNTER — Other Ambulatory Visit: Payer: Self-pay | Admitting: *Deleted

## 2015-08-25 ENCOUNTER — Encounter: Payer: Self-pay | Admitting: Internal Medicine

## 2015-08-25 ENCOUNTER — Other Ambulatory Visit (INDEPENDENT_AMBULATORY_CARE_PROVIDER_SITE_OTHER): Payer: Medicare Other | Admitting: *Deleted

## 2015-08-25 VITALS — BP 118/72 | HR 78 | Temp 97.6°F | Resp 12 | Wt 332.0 lb

## 2015-08-25 DIAGNOSIS — M2550 Pain in unspecified joint: Secondary | ICD-10-CM | POA: Insufficient documentation

## 2015-08-25 DIAGNOSIS — G8929 Other chronic pain: Secondary | ICD-10-CM | POA: Insufficient documentation

## 2015-08-25 DIAGNOSIS — E1122 Type 2 diabetes mellitus with diabetic chronic kidney disease: Secondary | ICD-10-CM

## 2015-08-25 DIAGNOSIS — I10 Essential (primary) hypertension: Secondary | ICD-10-CM | POA: Insufficient documentation

## 2015-08-25 DIAGNOSIS — Z86010 Personal history of colonic polyps: Secondary | ICD-10-CM | POA: Insufficient documentation

## 2015-08-25 DIAGNOSIS — Z794 Long term (current) use of insulin: Secondary | ICD-10-CM | POA: Diagnosis not present

## 2015-08-25 DIAGNOSIS — D696 Thrombocytopenia, unspecified: Secondary | ICD-10-CM | POA: Insufficient documentation

## 2015-08-25 DIAGNOSIS — G629 Polyneuropathy, unspecified: Secondary | ICD-10-CM | POA: Insufficient documentation

## 2015-08-25 DIAGNOSIS — N183 Chronic kidney disease, stage 3 unspecified: Secondary | ICD-10-CM

## 2015-08-25 DIAGNOSIS — I219 Acute myocardial infarction, unspecified: Secondary | ICD-10-CM | POA: Insufficient documentation

## 2015-08-25 DIAGNOSIS — M549 Dorsalgia, unspecified: Secondary | ICD-10-CM

## 2015-08-25 DIAGNOSIS — Z4502 Encounter for adjustment and management of automatic implantable cardiac defibrillator: Secondary | ICD-10-CM | POA: Insufficient documentation

## 2015-08-25 DIAGNOSIS — I255 Ischemic cardiomyopathy: Secondary | ICD-10-CM

## 2015-08-25 DIAGNOSIS — G4733 Obstructive sleep apnea (adult) (pediatric): Secondary | ICD-10-CM | POA: Insufficient documentation

## 2015-08-25 DIAGNOSIS — J189 Pneumonia, unspecified organism: Secondary | ICD-10-CM | POA: Insufficient documentation

## 2015-08-25 DIAGNOSIS — R350 Frequency of micturition: Secondary | ICD-10-CM | POA: Insufficient documentation

## 2015-08-25 DIAGNOSIS — M1711 Unilateral primary osteoarthritis, right knee: Secondary | ICD-10-CM | POA: Diagnosis not present

## 2015-08-25 DIAGNOSIS — M1990 Unspecified osteoarthritis, unspecified site: Secondary | ICD-10-CM | POA: Insufficient documentation

## 2015-08-25 DIAGNOSIS — Z87442 Personal history of urinary calculi: Secondary | ICD-10-CM | POA: Insufficient documentation

## 2015-08-25 DIAGNOSIS — K8020 Calculus of gallbladder without cholecystitis without obstruction: Secondary | ICD-10-CM | POA: Insufficient documentation

## 2015-08-25 DIAGNOSIS — N200 Calculus of kidney: Secondary | ICD-10-CM | POA: Insufficient documentation

## 2015-08-25 LAB — POCT GLYCOSYLATED HEMOGLOBIN (HGB A1C): Hemoglobin A1C: 7.6

## 2015-08-25 MED ORDER — INSULIN SYRINGE/NEEDLE U-500 31G X 6MM 0.5 ML MISC: 2.0000 | Freq: Two times a day (BID) | Status: DC

## 2015-08-25 MED ORDER — METFORMIN HCL ER 500 MG PO TB24: ORAL_TABLET | ORAL | Status: DC

## 2015-08-25 MED ORDER — INSULIN REGULAR HUMAN (CONC) 500 UNIT/ML ~~LOC~~ SOLN: SUBCUTANEOUS | Status: DC

## 2015-08-25 MED ORDER — EMPAGLIFLOZIN 25 MG PO TABS: 25.0000 mg | ORAL_TABLET | Freq: Every day | ORAL | Status: DC

## 2015-08-25 NOTE — Patient Instructions (Signed)
Please continue: - Metformin ER 2000 mg with dinner - Jardiance 25 mg in am  Change: - U500 insulin: - 18-20 units before breakfast. Please use less (15 units before a lower carb meal: eggs + sausage + toast) - 6 units before lunch - 6-8 units before dinner  Please check some sugars after b'fast and before lunch (at least 2x a week).  Please return in 3 months with your sugar log.

## 2015-08-25 NOTE — Progress Notes (Signed)
Patient ID: Robert Gregory, male   DOB: 09/06/1941, 74 y.o.   MRN: [id number]  HPI: Robert Gregory is a 74 y.o.-year-old male, returning for f/u for DM2, dx in 2004, insulin-dependent, uncontrolled, with complications (CAD - Ischemic CMP, s/p CABG x5 in 1997, CHF; CKD stage 3). Last visit 3 mo ago.  Last hemoglobin A1c was: Lab Results  Component Value Date   HGBA1C 7.6 08/25/2015   HGBA1C 7.7 05/19/2015   HGBA1C 8.0* 12/03/2014   Pt is on a regimen of: - Metformin ER 2000 mg with dinner - Jardiance 25 mg in am - U500 insulin - 18-20 units before B - 6-8 units before D - Cinnamon 500 mg 2x a day He gained 50 lbs with Actos in the past. He was on Invokana 100 mg in am  Pt checks his sugars 3x a day and they are: - am: 150s >> 148-186, 232, 264 - 2h after b'fast: n/c - before lunch: n/c - 2h after lunch: n/c - before dinner: 130s >> 141-236 - 2h after dinner: n/c >> 126-277, 290 - bedtime: 150-170 (if <120 - eat a snack) >> 95, 108-230 - nighttime: n/c >> 84-226 No lows. Lowest sugar was 84; he has hypoglycemia awareness at 70.  Highest sugar was 330 (rarely) - after carb-loaded meals >> 300 (chinese food)  Glucometer: [employer] 2  Pt's meals are: - Breakfast: cereals + 2% milk, OJ ; sausage + eggs + toast - Lunch: soup + sandwich (lightest) - Dinner: meat + veggies + starch  - Snacks: 1- pm: peanuts, V8 juice  - + mild CKD, last BUN/creatinine:  Lab Results  Component Value Date   BUN 19 12/01/2014   CREATININE 1.16 12/01/2014  On Benicar 80.  - last set of lipids: Lab Results  Component Value Date   CHOL 123 07/31/2014   HDL 30* 07/31/2014   LDLCALC 46 07/31/2014   TRIG 235* 07/31/2014   CHOLHDL 4.1 07/31/2014  On Pravachol 80. - last eye exam was in 10/2014. No DR.  - no numbness and tingling in his feet.  ROS: Constitutional: no weight gain/loss, + fatigue, no subjective hyperthermia Eyes: no blurry vision, no xerophthalmia ENT: no sore throat, no  nodules palpated in throat, no dysphagia/odynophagia, no hoarseness, + tinnitus Cardiovascular: no CP/+ SOB/no palpitations/leg swelling Respiratory: no cough/SOB Gastrointestinal: no N/V/D/C Musculoskeletal: no muscle/joint aches Skin: no rash Neurological: no tremors/numbness/tingling/dizziness  I reviewed pt's medications, allergies, PMH, social hx, family hx, and changes were documented in the history of present illness. Otherwise, unchanged from my initial visit note.  Past Medical History  Diagnosis Date  . CAD (coronary artery disease)   . Atrial fibrillation ([place])     takes Xarelto daily  . Hyperlipidemia     takes Sanmina-SCI daily  . Obstructive sleep apnea   . Nephrolithiasis   . Thrombocytopenia ([place])   . Cholelithiasis   . Spinal stenosis   . ICD (implantable cardioverter-defibrillator) battery depletion   . Ischemic cardiomyopathy   . Congestive heart failure ([place])     takes Spironolactone daily  . Hypertension     takes Benicar and Coreg daily  . Myocardial infarction ([place]) 1997    on the OR table   . Complication of anesthesia     forgetful for about 2-3wks after anesthesia  . Pneumonia     as a child  . Peripheral neuropathy ([place])   . Arthritis   . Joint pain   . Chronic back pain  scoliosis/arthritis  . History of colon polyps   . Urinary frequency   . Urinary urgency   . History of kidney stones   . Diabetes mellitus ([place])     takes Invokamet daily as well as Hum U   Past Surgical History  Procedure Laterality Date  . Coronary artery bypass and graft  1997    x 5  . Right rotator cuff surgery    . Total knee arthroplasty Left   . Laminectomy    . Tonsillectomy      adenoids  . Icd placed    . Colonoscopy    . Esophagogastroduodenoscopy    . Lithotripsy    . Total knee arthroplasty Right 11/29/2014    Procedure: RIGHT TOTAL KNEE ARTHROPLASTY;  Surgeon: Leandrew Koyanagi, MD;  Location: [facility];  Service: Orthopedics;   Laterality: Right;   Social History   Social History  . Marital Status: Married    Spouse Name: N/A  . Number of Children: N/A  . Years of Education: N/A   Occupational History  .      Retired from [employer]  . Smoking status: Never Smoker   . Smokeless tobacco: Not on file  . Alcohol Use: 0.0 oz/week    0 Standard drinks or equivalent per week     Comment: once drink a month  . Drug Use: No  . Sexual Activity: Not on file   Other Topics Concern  . Not on file   Social History Narrative   Current Outpatient Prescriptions on File Prior to Visit  Medication Sig Dispense Refill  . carvedilol (COREG) 12.5 MG tablet Take 1 tablet (12.5 mg total) by mouth 2 (two) times daily with a meal. Needs to make a yearly appointment 180 tablet 0  . Cholecalciferol (VITAMIN D PO) Take 5,000 Units by mouth daily.    . Cinnamon 500 MG TABS Take 500 mg by mouth daily.     . clobetasol ointment (TEMOVATE) 5.85 % Apply 1 application topically as needed.    . Coenzyme Q10 200 MG TABS Take 1 tablet by mouth daily.    . Cranberry 200 MG CAPS Take 300 mg by mouth daily.     . empagliflozin (JARDIANCE) 25 MG TABS tablet Take 25 mg by mouth daily. 90 tablet 1  . fenofibrate (TRICOR) 48 MG tablet TAKE 1 TABLET DAILY 90 tablet 1  . furosemide (LASIX) 40 MG tablet Take 2 tablets (80 mg total) by mouth 2 (two) times daily. (Patient taking differently: Take 40 mg by mouth 2 (two) times daily. ) 360 tablet 3  . HYDROcodone-acetaminophen (NORCO) 7.5-325 MG per tablet Take 1-2 tablets by mouth every 6 (six) hours as needed for moderate pain. 90 tablet 0  . Insulin Regular Human (HUMULIN R U-500, CONCENTRATED, [place]) Inject 18 Units into the skin. 18 U @ BREAKFAST AND 6 TO 8 U @ DINNER    . metFORMIN (GLUCOPHAGE-XR) 500 MG 24 hr tablet Take 2000 mg by mouth with dinner 360 tablet 1  . methocarbamol (ROBAXIN) 750 MG tablet Take 1 tablet (750 mg total) by mouth 2 (two) times daily as  needed for muscle spasms. 60 tablet 0  . montelukast (SINGULAIR) 10 MG tablet Take 10 mg by mouth at bedtime.    . Multiple Vitamins-Minerals (CENTRUM SILVER PO) Take 1 tablet by mouth daily.    . nitroGLYCERIN (NITROSTAT) 0.4 MG SL tablet Place 0.4 mg under the tongue every 5 (five)  minutes as needed for chest pain.    Marland Kitchen olmesartan (BENICAR) 40 MG tablet Take 2 tablets (80 mg total) by mouth daily. 180 tablet 3  . omega-3 acid ethyl esters (LOVAZA) 1 G capsule Take 2 capsules (2 g total) by mouth 2 (two) times daily. 360 capsule 3  . polyethylene glycol (MIRALAX / GLYCOLAX) packet Take 17 g by mouth daily as needed for mild constipation. 14 each 0  . potassium chloride (KLOR-CON 10) 10 MEQ tablet Take 1 tablet (10 mEq total) by mouth 2 (two) times daily. <Please schedule follow up appointment.> 30 tablet 0  . pravastatin (PRAVACHOL) 80 MG tablet TAKE 1 TABLET DAILY 90 tablet 1  . Probiotic Product (PROBIOTIC DAILY PO) Take 1 tablet by mouth daily.    Marland Kitchen senna (SENOKOT) 8.6 MG TABS tablet Take 1 tablet (8.6 mg total) by mouth 2 (two) times daily. 120 each 0  . spironolactone (ALDACTONE) 25 MG tablet Take 1 tablet (25 mg total) by mouth daily. 90 tablet 3  . XARELTO 20 MG TABS tablet TAKE 1 TABLET DAILY WITH SUPPER 90 tablet 2  . ZETIA 10 MG tablet TAKE 1 TABLET DAILY 90 tablet 1   No current facility-administered medications on file prior to visit.   Allergies  Allergen Reactions  . Adhesive [Tape]     Rash   . Iodine   . Contrast Media [Iodinated Diagnostic Agents] Rash and Other (See Comments)    flushing EXTREME FLUSHING    Family History  Problem Relation Age of Onset  . Heart disease      No family history   PE: BP 118/72 mmHg  Pulse 78  Temp(Src) 97.6 F (36.4 C) (Oral)  Resp 12  Wt 332 lb (150.594 kg)  SpO2 96% Wt Readings from Last 3 Encounters:  08/25/15 332 lb (150.594 kg)  05/19/15 329 lb 12.8 oz (149.596 kg)  05/15/15 326 lb (147.873 kg)   Constitutional: obese,  in NAD Eyes: PERRLA, EOMI, no exophthalmos ENT: moist mucous membranes, no thyromegaly, no cervical lymphadenopathy Cardiovascular: RRR, No MRG Respiratory: CTA B Gastrointestinal: abdomen soft, NT, ND, BS+ Musculoskeletal: no deformities, strength intact in all 4 Skin: moist, warm, no rashes Neurological: + tremor with outstretched hands, DTR normal in all 4  ASSESSMENT: 1. DM2, insulin-dependent, uncontrolled, with complications - CAD, s/p CABG 5x in 1997 (Dr Roxy Horseman), iCMP >> CHF (Dr Stanford Breed) - CKD stage 3  PLAN:  1. Patient with long-standing, uncontrolled diabetes, on oral antidiabetic regimen + U500 insulin, with worse control now. His sugars are high throughout the day, but I believe the pb is high sugars after lunch >> sugars later in the day are high >> am sugars are also high >> will add U500 with lunch also. - I suggested to:  Patient Instructions  Please continue: - Metformin ER 2000 mg with dinner - Jardiance 25 mg in am  Change: - U500 insulin: - 18-20 units before breakfast. Please use less (15 units before a lower carb meal: eggs + sausage + toast) - 6 units before lunch - 6-8 units before dinner  Please check some sugars after b'fast and before lunch (at least 2x a week).  Please return in 3 months with your sugar log.   - continue checking sugars at different times of the day - check 3 times a day, rotating checks - advised for yearly eye exams >> he is UTD - checked HbA1c today >> 7.6% (slightly lower, but still above goal) - Return to clinic  in 3 mo with sugar log

## 2015-08-26 ENCOUNTER — Other Ambulatory Visit: Payer: Self-pay | Admitting: Cardiology

## 2015-08-26 ENCOUNTER — Other Ambulatory Visit (HOSPITAL_BASED_OUTPATIENT_CLINIC_OR_DEPARTMENT_OTHER): Payer: Medicare Other

## 2015-08-26 ENCOUNTER — Ambulatory Visit (HOSPITAL_BASED_OUTPATIENT_CLINIC_OR_DEPARTMENT_OTHER): Payer: Medicare Other | Admitting: Hematology & Oncology

## 2015-08-26 ENCOUNTER — Ambulatory Visit (INDEPENDENT_AMBULATORY_CARE_PROVIDER_SITE_OTHER): Payer: Medicare Other | Admitting: *Deleted

## 2015-08-26 ENCOUNTER — Encounter: Payer: Self-pay | Admitting: Hematology & Oncology

## 2015-08-26 VITALS — BP 129/69 | HR 71 | Temp 97.6°F | Resp 18 | Ht 72.0 in | Wt 332.0 lb

## 2015-08-26 DIAGNOSIS — I255 Ischemic cardiomyopathy: Secondary | ICD-10-CM | POA: Diagnosis not present

## 2015-08-26 DIAGNOSIS — D6959 Other secondary thrombocytopenia: Secondary | ICD-10-CM

## 2015-08-26 DIAGNOSIS — R161 Splenomegaly, not elsewhere classified: Secondary | ICD-10-CM | POA: Diagnosis not present

## 2015-08-26 DIAGNOSIS — D696 Thrombocytopenia, unspecified: Secondary | ICD-10-CM

## 2015-08-26 LAB — CHCC SATELLITE - SMEAR

## 2015-08-26 LAB — CBC WITH DIFFERENTIAL (CANCER CENTER ONLY)
BASO#: 0 10*3/uL (ref 0.0–0.2)
BASO%: 0.4 % (ref 0.0–2.0)
EOS ABS: 0.2 10*3/uL (ref 0.0–0.5)
EOS%: 3.1 % (ref 0.0–7.0)
HCT: 42.7 % (ref 38.7–49.9)
HGB: 14.1 g/dL (ref 13.0–17.1)
LYMPH#: 0.9 10*3/uL (ref 0.9–3.3)
LYMPH%: 18.8 % (ref 14.0–48.0)
MCH: 30.8 pg (ref 28.0–33.4)
MCHC: 33 g/dL (ref 32.0–35.9)
MCV: 93 fL (ref 82–98)
MONO#: 0.6 10*3/uL (ref 0.1–0.9)
MONO%: 12.6 % (ref 0.0–13.0)
NEUT#: 3.1 10*3/uL (ref 1.5–6.5)
NEUT%: 65.1 % (ref 40.0–80.0)
RBC: 4.58 10*6/uL (ref 4.20–5.70)
RDW: 15.4 % (ref 11.1–15.7)
WBC: 4.8 10*3/uL (ref 4.0–10.0)

## 2015-08-26 NOTE — Progress Notes (Signed)
HPI: FU coronary artery disease. Patient is status post coronary artery bypassing graft in 1997. Patient had a LIMA to the LAD, saphenous vein graft to the diagonal, saphenous vein graft to the circumflex and sequential saphenous vein graft to the PDA and posterior lateral. His procedure was performed at [facility]. He subsequently moved to [facility]. Echocardiogram in 2005 showed an ejection fraction of 38%, restrictive filling and trace tricuspid regurgitation. Cardiac catheterization in March 2014 revealed occlusion of the LAD with a 70% ostial diagonal. The circumflex and right coronary artery were occluded. The saphenous vein graft to the circumflex was occluded. Saphenous vein graft to the diagonal occluded. The graft to the PDA and posterior lateral was patent. The LIMA to the LAD was patent. There were left to left collaterals filling marginal branches and diagonal branch. Ejection fraction 30-35%. Patient treated medically. Patient had ICD placed at that time. Also with h/o atrial fibrillation. Abdominal ultrasound August 2016 showed no aneurysm. Since last seen, He has dyspnea with more extreme activities. No orthopnea or PND. Minimal pedal edema. He has a "awareness of his chest" which is new. No clear chest pain.  Current Outpatient Prescriptions  Medication Sig Dispense Refill  . BD INSULIN SYRINGE ULTRAFINE 31G X 15/64" 0.5 ML MISC     . carvedilol (COREG) 12.5 MG tablet Take 1 tablet (12.5 mg total) by mouth 2 (two) times daily with a meal. Needs to make a yearly appointment 180 tablet 0  . Cholecalciferol (VITAMIN D PO) Take 5,000 Units by mouth daily.    . clobetasol ointment (TEMOVATE) 5.78 % Apply 1 application topically as needed.    . Coenzyme Q10 200 MG TABS Take 1 tablet by mouth daily.    . Cranberry 200 MG CAPS Take 300 mg by mouth daily.     . empagliflozin (JARDIANCE) 25 MG TABS tablet Take 25 mg by mouth daily. 90 tablet 1  . fenofibrate (TRICOR) 48 MG tablet  TAKE 1 TABLET DAILY 90 tablet 1  . furosemide (LASIX) 40 MG tablet Take 40 mg by mouth 2 (two) times daily.    . insulin regular human CONCENTRATED (HUMULIN R) 500 UNIT/ML injection Use up to 200 units (0.4 mL) a day as advised. 60 mL 1  . Insulin Syringe/Needle U-500 (BD INSULIN SYRINGE U-500) 31G X 6MM 0.5 ML MISC Inject 2 each into the skin 2 (two) times daily with a meal. 200 each 3  . metFORMIN (GLUCOPHAGE-XR) 500 MG 24 hr tablet Take 2000 mg by mouth with dinner 360 tablet 1  . montelukast (SINGULAIR) 10 MG tablet Take 10 mg by mouth at bedtime.    . Multiple Vitamins-Minerals (CENTRUM SILVER PO) Take 1 tablet by mouth daily.    . nitroGLYCERIN (NITROSTAT) 0.4 MG SL tablet Place 0.4 mg under the tongue every 5 (five) minutes as needed for chest pain.    Marland Kitchen olmesartan (BENICAR) 40 MG tablet Take 2 tablets (80 mg total) by mouth daily. 180 tablet 3  . omega-3 acid ethyl esters (LOVAZA) 1 G capsule Take 2 capsules (2 g total) by mouth 2 (two) times daily. 360 capsule 3  . polyethylene glycol (MIRALAX / GLYCOLAX) packet Take 17 g by mouth daily as needed for mild constipation. 14 each 0  . potassium chloride (K-DUR,KLOR-CON) 10 MEQ tablet Take 20 mEq by mouth 2 (two) times daily.    . pravastatin (PRAVACHOL) 80 MG tablet TAKE 1 TABLET DAILY 90 tablet 1  . Probiotic Product (PROBIOTIC  DAILY PO) Take 1 tablet by mouth daily.    Marland Kitchen senna (SENOKOT) 8.6 MG TABS tablet Take 1 tablet (8.6 mg total) by mouth 2 (two) times daily. 120 each 0  . spironolactone (ALDACTONE) 25 MG tablet Take 1 tablet (25 mg total) by mouth daily. 90 tablet 3  . XARELTO 20 MG TABS tablet TAKE 1 TABLET DAILY WITH SUPPER 90 tablet 2  . ZETIA 10 MG tablet TAKE 1 TABLET DAILY 90 tablet 1   No current facility-administered medications for this visit.     Past Medical History  Diagnosis Date  . CAD (coronary artery disease)   . Atrial fibrillation ([place])     takes Xarelto daily  . Hyperlipidemia     takes  Sanmina-SCI daily  . Obstructive sleep apnea   . Nephrolithiasis   . Thrombocytopenia ([place])   . Cholelithiasis   . Spinal stenosis   . ICD (implantable cardioverter-defibrillator) battery depletion   . Ischemic cardiomyopathy   . Congestive heart failure ([place])     takes Spironolactone daily  . Hypertension     takes Benicar and Coreg daily  . Myocardial infarction ([place]) 1997    on the OR table   . Complication of anesthesia     forgetful for about 2-3wks after anesthesia  . Pneumonia     as a child  . Peripheral neuropathy ([place])   . Arthritis   . Joint pain   . Chronic back pain     scoliosis/arthritis  . History of colon polyps   . Urinary frequency   . Urinary urgency   . History of kidney stones   . Diabetes mellitus ([place])     takes Invokamet daily as well as Hum U  . Cardiomyopathy, ischemic 11/07/2013  . Hyperlipidemia 11/07/2013  . Ischemic cardiomyopathy   . ICD (implantable cardioverter-defibrillator) in place 11/07/2013  . Type 2 diabetes mellitus with stage 3 chronic kidney disease ([place]) 05/19/2015  . Total knee replacement status 11/29/2014    Past Surgical History  Procedure Laterality Date  . Coronary artery bypass and graft  1997    x 5  . Right rotator cuff surgery    . Total knee arthroplasty Left   . Laminectomy    . Tonsillectomy      adenoids  . Icd placed    . Colonoscopy    . Esophagogastroduodenoscopy    . Lithotripsy    . Total knee arthroplasty Right 11/29/2014    Procedure: RIGHT TOTAL KNEE ARTHROPLASTY;  Surgeon: Leandrew Koyanagi, MD;  Location: [place];  Service: Orthopedics;  Laterality: Right;    Social History   Social History  . Marital Status: Married    Spouse Name: N/A  . Number of Children: N/A  . Years of Education: N/A   Occupational History  .      Retired from [employer]  . Smoking status: Never Smoker   . Smokeless tobacco: Not on file  . Alcohol Use: 0.0 oz/week    0  Standard drinks or equivalent per week     Comment: once drink a month  . Drug Use: No  . Sexual Activity: Not on file   Other Topics Concern  . Not on file   Social History Narrative    ROS: no fevers or chills, productive cough, hemoptysis, dysphasia, odynophagia, melena, hematochezia, dysuria, hematuria, rash, seizure activity, orthopnea, PND, claudication. Remaining systems are negative.  Physical Exam: Well-developed morbidly obese in  no acute distress.  Skin is warm and dry.  HEENT is normal.  Neck is supple.  Chest is clear to auscultation with normal expansion.  Cardiovascular exam is regular rate and rhythm.  Abdominal exam nontender or distended. No masses palpated. Extremities show trace edema. neuro grossly intact  ECG Sinus rhythm with first-degree AV block, right bundle branch block, inferior infarct.

## 2015-08-26 NOTE — Progress Notes (Signed)
Hematology and Oncology Follow Up Visit  Robert Gregory [id number] 11-05-1940 74 y.o. 08/26/2015   Principle Diagnosis:   Chronic thrombocytopenia secondary to splenomegaly  Current Therapy:    Observation     Interim History:  Robert Gregory is back for follow-up. We first saw him back in August. At that point time, his platelet count was 84,000.  He has diabetes. He has atrial fibrillation. He has sleep apnea. We did go ahead and get a ultrasound of his abdomen. This did show some splenomegaly. Because of his size, they would cannot comment too much on his liver however, that looked like the liver probably was with some fatty infiltration.  On ultrasound, his spleen was only 895 cm.  He is on Xarelto. He is on lifelong Xarelto. He is on this for atrial fibrillation.  He's had no bleeding or bruising.  He's had no change in bowel or bladder habits.  He will be staying around town for Christmas.  Overall, his performance status is ECOG 1.  Medications:  Current outpatient prescriptions:  .  BD INSULIN SYRINGE ULTRAFINE 31G X 15/64" 0.5 ML MISC, , Disp: , Rfl:  .  carvedilol (COREG) 12.5 MG tablet, Take 1 tablet (12.5 mg total) by mouth 2 (two) times daily with a meal. Needs to make a yearly appointment, Disp: 180 tablet, Rfl: 0 .  Cholecalciferol (VITAMIN D PO), Take 5,000 Units by mouth daily., Disp: , Rfl:  .  clobetasol ointment (TEMOVATE) 5.42 %, Apply 1 application topically as needed., Disp: , Rfl:  .  Coenzyme Q10 200 MG TABS, Take 1 tablet by mouth daily., Disp: , Rfl:  .  Cranberry 200 MG CAPS, Take 300 mg by mouth daily. , Disp: , Rfl:  .  empagliflozin (JARDIANCE) 25 MG TABS tablet, Take 25 mg by mouth daily., Disp: 90 tablet, Rfl: 1 .  fenofibrate (TRICOR) 48 MG tablet, TAKE 1 TABLET DAILY, Disp: 90 tablet, Rfl: 1 .  furosemide (LASIX) 40 MG tablet, Take 2 tablets (80 mg total) by mouth 2 (two) times daily. (Patient taking differently: Take 40 mg by mouth 2 (two) times  daily. ), Disp: 360 tablet, Rfl: 3 .  insulin regular human CONCENTRATED (HUMULIN R) 500 UNIT/ML injection, Use up to 200 units (0.4 mL) a day as advised., Disp: 60 mL, Rfl: 1 .  Insulin Syringe/Needle U-500 (BD INSULIN SYRINGE U-500) 31G X 6MM 0.5 ML MISC, Inject 2 each into the skin 2 (two) times daily with a meal., Disp: 200 each, Rfl: 3 .  metFORMIN (GLUCOPHAGE-XR) 500 MG 24 hr tablet, Take 2000 mg by mouth with dinner, Disp: 360 tablet, Rfl: 1 .  montelukast (SINGULAIR) 10 MG tablet, Take 10 mg by mouth at bedtime., Disp: , Rfl:  .  Multiple Vitamins-Minerals (CENTRUM SILVER PO), Take 1 tablet by mouth daily., Disp: , Rfl:  .  nitroGLYCERIN (NITROSTAT) 0.4 MG SL tablet, Place 0.4 mg under the tongue every 5 (five) minutes as needed for chest pain., Disp: , Rfl:  .  olmesartan (BENICAR) 40 MG tablet, Take 2 tablets (80 mg total) by mouth daily., Disp: 180 tablet, Rfl: 3 .  omega-3 acid ethyl esters (LOVAZA) 1 G capsule, Take 2 capsules (2 g total) by mouth 2 (two) times daily., Disp: 360 capsule, Rfl: 3 .  polyethylene glycol (MIRALAX / GLYCOLAX) packet, Take 17 g by mouth daily as needed for mild constipation., Disp: 14 each, Rfl: 0 .  potassium chloride (KLOR-CON 10) 10 MEQ tablet, Take 1 tablet (10  mEq total) by mouth 2 (two) times daily. <Please schedule follow up appointment.>, Disp: 30 tablet, Rfl: 0 .  pravastatin (PRAVACHOL) 80 MG tablet, TAKE 1 TABLET DAILY, Disp: 90 tablet, Rfl: 1 .  Probiotic Product (PROBIOTIC DAILY PO), Take 1 tablet by mouth daily., Disp: , Rfl:  .  XARELTO 20 MG TABS tablet, TAKE 1 TABLET DAILY WITH SUPPER, Disp: 90 tablet, Rfl: 2 .  ZETIA 10 MG tablet, TAKE 1 TABLET DAILY, Disp: 90 tablet, Rfl: 1 .  senna (SENOKOT) 8.6 MG TABS tablet, Take 1 tablet (8.6 mg total) by mouth 2 (two) times daily., Disp: 120 each, Rfl: 0 .  spironolactone (ALDACTONE) 25 MG tablet, Take 1 tablet (25 mg total) by mouth daily., Disp: 90 tablet, Rfl: 3  Allergies:  Allergies  Allergen  Reactions  . Adhesive [Tape]     Rash   . Iodine   . Contrast Media [Iodinated Diagnostic Agents] Rash and Other (See Comments)    flushing EXTREME FLUSHING     Past Medical History, Surgical history, Social history, and Family History were reviewed and updated.  Review of Systems: As above  Physical Exam:  height is 6' (1.829 m) and weight is 332 lb (150.594 kg). His oral temperature is 97.6 F (36.4 C). His blood pressure is 129/69 and his pulse is 71. His respiration is 18.   Wt Readings from Last 3 Encounters:  08/26/15 332 lb (150.594 kg)  08/25/15 332 lb (150.594 kg)  05/19/15 329 lb 12.8 oz (149.596 kg)     Morbidly obese white male in no obvious distress. Head and neck exam shows no ocular or oral lesions. He has no palpable cervical or supraclavicular lymph nodes. Lungs are clear. Cardiac exam regular rate and rhythm with no murmurs, rubs or bruits. Abdomen is obese but soft. He has decent bowel sounds. There is no guarding or rebound tenderness. He has no fluid wave. There is no obvious hepatomegaly. I cannot palpate his spleen tip. Back exam shows no tenderness over the spine, ribs or hips. Extremities shows some chronic nonpitting edema in his lower legs. Skin exam shows no rashes, ecchymoses or petechia. Neurological exam shows no focal neurological deficits.  Lab Results  Component Value Date   WBC 4.8 08/26/2015   HGB 14.1 08/26/2015   HCT 42.7 08/26/2015   MCV 93 08/26/2015   PLT 80 Platelet count consistent in citrate* 08/26/2015     Chemistry      Component Value Date/Time   NA 132* 12/01/2014 0554   K 4.7 12/01/2014 0554   CL 101 12/01/2014 0554   CO2 21 12/01/2014 0554   BUN 19 12/01/2014 0554   CREATININE 1.16 12/01/2014 0554   CREATININE 1.27 09/18/2014 0911      Component Value Date/Time   CALCIUM 8.9 12/01/2014 0554   ALKPHOS 57 11/19/2014 1446   AST 60* 11/19/2014 1446   ALT 60* 11/19/2014 1446   BILITOT 0.6 11/19/2014 1446          Impression and Plan: Robert Gregory is a 74 year old white male. He has moderate thrombocytopenia. His plan Is holding pretty stable compared to 4 months ago. I think that he probably has some splenic sequestration. He also may have some medication induced thrombocytopenia. However, he needs to be on his medications.  I still do not think that he needs a bone marrow test. I looked at his blood smear. I do not see any atypical lymphocytes were immature myeloid cells. His platelets showed some large platelets  that were well granulated. He had no nucleated red cells or teardrop cells.  I think we probably get him back in another 4 months or so.  Despite the thrombocytopenia, I think his risk of bleeding is minimal. I think his platelets are very functional.   [name], MD 12/6/201612:10 PM

## 2015-08-26 NOTE — Telephone Encounter (Signed)
Rx request sent to pharmacy.  

## 2015-08-27 ENCOUNTER — Ambulatory Visit (INDEPENDENT_AMBULATORY_CARE_PROVIDER_SITE_OTHER): Payer: Medicare Other | Admitting: Cardiology

## 2015-08-27 ENCOUNTER — Encounter: Payer: Self-pay | Admitting: Cardiology

## 2015-08-27 VITALS — BP 128/68 | HR 83 | Ht 73.0 in | Wt 332.1 lb

## 2015-08-27 DIAGNOSIS — I480 Paroxysmal atrial fibrillation: Secondary | ICD-10-CM

## 2015-08-27 DIAGNOSIS — I255 Ischemic cardiomyopathy: Secondary | ICD-10-CM | POA: Diagnosis not present

## 2015-08-27 DIAGNOSIS — I10 Essential (primary) hypertension: Secondary | ICD-10-CM | POA: Diagnosis not present

## 2015-08-27 DIAGNOSIS — Z95810 Presence of automatic (implantable) cardiac defibrillator: Secondary | ICD-10-CM

## 2015-08-27 DIAGNOSIS — I2510 Atherosclerotic heart disease of native coronary artery without angina pectoris: Secondary | ICD-10-CM | POA: Diagnosis not present

## 2015-08-27 DIAGNOSIS — I2583 Coronary atherosclerosis due to lipid rich plaque: Secondary | ICD-10-CM

## 2015-08-27 DIAGNOSIS — E785 Hyperlipidemia, unspecified: Secondary | ICD-10-CM

## 2015-08-27 DIAGNOSIS — Z79899 Other long term (current) drug therapy: Secondary | ICD-10-CM

## 2015-08-27 LAB — LIPID PANEL
CHOL/HDL RATIO: 6.1 ratio — AB (ref <=5.0)
Cholesterol: 121 mg/dL — ABNORMAL LOW (ref 125–200)
HDL: 20 mg/dL — AB (ref >=40)
LDL Cholesterol: 31 mg/dL (ref <130)
Triglycerides: 350 mg/dL — ABNORMAL HIGH (ref <150)
VLDL: 70 mg/dL — ABNORMAL HIGH (ref <30)

## 2015-08-27 LAB — BASIC METABOLIC PANEL
BUN: 41 mg/dL — AB (ref 7–25)
CALCIUM: 9.7 mg/dL (ref 8.6–10.3)
CO2: 22 mmol/L (ref 20–31)
Chloride: 99 mmol/L (ref 98–110)
Creat: 1.33 mg/dL — ABNORMAL HIGH (ref 0.70–1.18)
GLUCOSE: 202 mg/dL — AB (ref 65–99)
Potassium: 4.1 mmol/L (ref 3.5–5.3)
SODIUM: 139 mmol/L (ref 135–146)

## 2015-08-27 LAB — HEPATIC FUNCTION PANEL
ALK PHOS: 48 U/L (ref 40–115)
ALT: 42 U/L (ref 9–46)
AST: 45 U/L — AB (ref 10–35)
Albumin: 4.1 g/dL (ref 3.6–5.1)
BILIRUBIN DIRECT: 0.2 mg/dL (ref <=0.2)
BILIRUBIN INDIRECT: 0.4 mg/dL (ref 0.2–1.2)
BILIRUBIN TOTAL: 0.6 mg/dL (ref 0.2–1.2)
Total Protein: 6.7 g/dL (ref 6.1–8.1)

## 2015-08-27 MED ORDER — PRAVASTATIN SODIUM 80 MG PO TABS: 80.0000 mg | ORAL_TABLET | Freq: Every day | ORAL | Status: DC

## 2015-08-27 MED ORDER — POTASSIUM CHLORIDE CRYS ER 10 MEQ PO TBCR: 20.0000 meq | EXTENDED_RELEASE_TABLET | Freq: Two times a day (BID) | ORAL | Status: DC

## 2015-08-27 MED ORDER — OLMESARTAN MEDOXOMIL 40 MG PO TABS: 80.0000 mg | ORAL_TABLET | Freq: Every day | ORAL | Status: DC

## 2015-08-27 MED ORDER — OMEGA-3-ACID ETHYL ESTERS 1 G PO CAPS: 2.0000 g | ORAL_CAPSULE | Freq: Two times a day (BID) | ORAL | Status: DC

## 2015-08-27 MED ORDER — CARVEDILOL 12.5 MG PO TABS: 12.5000 mg | ORAL_TABLET | Freq: Two times a day (BID) | ORAL | Status: DC

## 2015-08-27 MED ORDER — NITROGLYCERIN 0.4 MG SL SUBL: 0.4000 mg | SUBLINGUAL_TABLET | SUBLINGUAL | Status: DC | PRN

## 2015-08-27 MED ORDER — SPIRONOLACTONE 25 MG PO TABS: 25.0000 mg | ORAL_TABLET | Freq: Every day | ORAL | Status: DC

## 2015-08-27 MED ORDER — FENOFIBRATE 48 MG PO TABS: 48.0000 mg | ORAL_TABLET | Freq: Every day | ORAL | Status: DC

## 2015-08-27 MED ORDER — FUROSEMIDE 40 MG PO TABS: 40.0000 mg | ORAL_TABLET | Freq: Two times a day (BID) | ORAL | Status: DC

## 2015-08-27 MED ORDER — EZETIMIBE 10 MG PO TABS: 10.0000 mg | ORAL_TABLET | Freq: Every day | ORAL | Status: DC

## 2015-08-27 MED ORDER — RIVAROXABAN 20 MG PO TABS: 20.0000 mg | ORAL_TABLET | Freq: Every day | ORAL | Status: DC

## 2015-08-27 NOTE — Assessment & Plan Note (Signed)
Followed by electrophysiology. 

## 2015-08-27 NOTE — Assessment & Plan Note (Signed)
Continue statin. Check lipids and liver. 

## 2015-08-27 NOTE — Patient Instructions (Signed)
Medication Instructions:  Please continue your current medications.  Labwork: Your physician recommends that you return for lab work in [place].  Testing/Procedures: Your physician has requested that you have a lexiscan myoview. For further information please visit [email or web address]. Please follow instruction sheet, as given.  Follow-Up: Dr Stanford Breed recommends that you schedule a follow-up appointment in 6 months. You will receive a reminder letter in the mail two months in advance. If you don't receive a letter, please call our office to schedule the follow-up appointment.  If you need a refill on your cardiac medications before your next appointment, please call your pharmacy.

## 2015-08-27 NOTE — Assessment & Plan Note (Signed)
Continue ARB and beta blocker. 

## 2015-08-27 NOTE — Assessment & Plan Note (Signed)
Blood pressure controlled. Continue present medications. 

## 2015-08-27 NOTE — Progress Notes (Signed)
Remote ICD transmission.   

## 2015-08-27 NOTE — Assessment & Plan Note (Addendum)
Continue statin. Not on aspirin given need for anticoagulation. Patient has a vague pain in his chest at times. We will arrange a [place] nuclear study for risk stratification.

## 2015-08-27 NOTE — Assessment & Plan Note (Signed)
Patient remains in sinus rhythm. Continue xarelto. Continue coreg.

## 2015-08-28 ENCOUNTER — Telehealth: Payer: Self-pay | Admitting: *Deleted

## 2015-08-28 DIAGNOSIS — N289 Disorder of kidney and ureter, unspecified: Secondary | ICD-10-CM

## 2015-08-28 MED ORDER — FUROSEMIDE 40 MG PO TABS: 40.0000 mg | ORAL_TABLET | Freq: Every day | ORAL | Status: DC

## 2015-08-28 NOTE — Telephone Encounter (Signed)
-----   Message from Lelon Perla, MD sent at 08/28/2015  5:38 AM EST ----- Change lasix to 40 mg daily, bme 2 weeks [facility]

## 2015-08-28 NOTE — Telephone Encounter (Signed)
Patient voiced understanding of medication change and need for repeat labs in 2 weeks. Lab orders mailed to the pt.

## 2015-08-30 ENCOUNTER — Encounter: Payer: Self-pay | Admitting: Internal Medicine

## 2015-08-30 ENCOUNTER — Encounter: Payer: Self-pay | Admitting: Cardiology

## 2015-09-04 LAB — CUP PACEART REMOTE DEVICE CHECK
Battery Remaining Longevity: 80 mo
Battery Remaining Percentage: 75 %
Battery Voltage: 2.98 V
Brady Statistic RV Percent Paced: 1 %
Date Time Interrogation Session: 20161206080956
HighPow Impedance: 79 Ohm
HighPow Impedance: 79 Ohm
Implantable Lead Implant Date: 20140320
Implantable Lead Implant Date: 20140320
Implantable Lead Location: 753859
Implantable Lead Location: 753860
Lead Channel Impedance Value: 390 Ohm
Lead Channel Impedance Value: 430 Ohm
Lead Channel Pacing Threshold Amplitude: 0.75 V
Lead Channel Pacing Threshold Pulse Width: 0.5 ms
Lead Channel Sensing Intrinsic Amplitude: 12 mV
Lead Channel Sensing Intrinsic Amplitude: 2.9 mV
Lead Channel Setting Pacing Amplitude: 2 V
Lead Channel Setting Pacing Pulse Width: 0.5 ms
Lead Channel Setting Sensing Sensitivity: 0.5 mV
Pulse Gen Serial Number: 7067730

## 2015-09-07 ENCOUNTER — Encounter: Payer: Self-pay | Admitting: Internal Medicine

## 2015-09-09 ENCOUNTER — Telehealth (HOSPITAL_COMMUNITY): Payer: Self-pay

## 2015-09-09 NOTE — Telephone Encounter (Signed)
Encounter complete. 

## 2015-09-10 ENCOUNTER — Encounter: Payer: Self-pay | Admitting: Cardiology

## 2015-09-10 ENCOUNTER — Encounter (HOSPITAL_COMMUNITY): Payer: Medicare Other

## 2015-09-11 ENCOUNTER — Ambulatory Visit (HOSPITAL_COMMUNITY)
Admission: RE | Admit: 2015-09-11 | Discharge: 2015-09-11 | Disposition: A | Discharge status: Home / Self Care | Payer: Medicare Other | Source: Ambulatory Visit | Attending: Cardiovascular Disease | Admitting: Cardiovascular Disease

## 2015-09-11 DIAGNOSIS — R0609 Other forms of dyspnea: Secondary | ICD-10-CM | POA: Insufficient documentation

## 2015-09-11 DIAGNOSIS — I517 Cardiomegaly: Secondary | ICD-10-CM | POA: Diagnosis not present

## 2015-09-11 DIAGNOSIS — E669 Obesity, unspecified: Secondary | ICD-10-CM | POA: Insufficient documentation

## 2015-09-11 DIAGNOSIS — I4510 Unspecified right bundle-branch block: Secondary | ICD-10-CM | POA: Diagnosis not present

## 2015-09-11 DIAGNOSIS — R5383 Other fatigue: Secondary | ICD-10-CM | POA: Insufficient documentation

## 2015-09-11 DIAGNOSIS — I10 Essential (primary) hypertension: Secondary | ICD-10-CM | POA: Insufficient documentation

## 2015-09-11 DIAGNOSIS — G4733 Obstructive sleep apnea (adult) (pediatric): Secondary | ICD-10-CM | POA: Insufficient documentation

## 2015-09-11 DIAGNOSIS — E119 Type 2 diabetes mellitus without complications: Secondary | ICD-10-CM | POA: Insufficient documentation

## 2015-09-11 DIAGNOSIS — R9439 Abnormal result of other cardiovascular function study: Secondary | ICD-10-CM | POA: Insufficient documentation

## 2015-09-11 DIAGNOSIS — N289 Disorder of kidney and ureter, unspecified: Secondary | ICD-10-CM | POA: Diagnosis not present

## 2015-09-11 DIAGNOSIS — I2510 Atherosclerotic heart disease of native coronary artery without angina pectoris: Secondary | ICD-10-CM | POA: Insufficient documentation

## 2015-09-11 DIAGNOSIS — Z6841 Body Mass Index (BMI) 40.0 and over, adult: Secondary | ICD-10-CM | POA: Diagnosis not present

## 2015-09-11 DIAGNOSIS — I2583 Coronary atherosclerosis due to lipid rich plaque: Secondary | ICD-10-CM

## 2015-09-11 LAB — MYOCARDIAL PERFUSION IMAGING
CHL CUP NUCLEAR SSS: 13
CSEPPHR: 84 {beats}/min
LV dias vol: 174 mL
LVSYSVOL: 87 mL
Rest HR: 67 {beats}/min
SDS: 5
SRS: 8
TID: 1.32

## 2015-09-11 MED ORDER — REGADENOSON 0.4 MG/5ML IV SOLN
0.4000 mg | Freq: Once | INTRAVENOUS | Status: AC
  Administered 2015-09-11: 0.4 mg via INTRAVENOUS

## 2015-09-11 MED ORDER — TECHNETIUM TC 99M SESTAMIBI GENERIC - CARDIOLITE
31.2000 | Freq: Once | INTRAVENOUS | Status: AC | PRN
  Administered 2015-09-11: 31.2 via INTRAVENOUS

## 2015-09-11 MED ORDER — TECHNETIUM TC 99M SESTAMIBI GENERIC - CARDIOLITE
10.8000 | Freq: Once | INTRAVENOUS | Status: AC | PRN
  Administered 2015-09-11: 10.8 via INTRAVENOUS

## 2015-09-12 LAB — BASIC METABOLIC PANEL
BUN: 30 mg/dL — ABNORMAL HIGH (ref 7–25)
CALCIUM: 9.4 mg/dL (ref 8.6–10.3)
CO2: 24 mmol/L (ref 20–31)
Chloride: 104 mmol/L (ref 98–110)
Creat: 1.28 mg/dL — ABNORMAL HIGH (ref 0.70–1.18)
Glucose, Bld: 163 mg/dL — ABNORMAL HIGH (ref 65–99)
Potassium: 4.3 mmol/L (ref 3.5–5.3)
SODIUM: 139 mmol/L (ref 135–146)

## 2015-09-23 NOTE — Progress Notes (Signed)
HPI: FU coronary artery disease. Patient is status post coronary artery bypassing graft in 1997. Patient had a LIMA to the LAD, saphenous vein graft to the diagonal, saphenous vein graft to the circumflex and sequential saphenous vein graft to the PDA and posterior lateral. His procedure was performed at [facility]. He subsequently moved to [facility]. Echocardiogram in 2005 showed an ejection fraction of 38%, restrictive filling and trace tricuspid regurgitation. Cardiac catheterization in March 2014 revealed occlusion of the LAD with a 70% ostial diagonal. The circumflex and right coronary artery were occluded. The saphenous vein graft to the circumflex was occluded. Saphenous vein graft to the diagonal occluded. The graft to the PDA and posterior lateral was patent. The LIMA to the LAD was patent. There were left to left collaterals filling marginal branches and diagonal branch. Ejection fraction 30-35%. Patient treated medically. Patient had ICD placed at that time. Also with h/o atrial fibrillation. Abdominal ultrasound August 2016 showed no aneurysm. Nuclear study 12/16 showed EF 50; prior inferior MI with mild peri-infarct ischemia and moderate ischemia in the anterolateral wall. Note pt with chronic thrombocytopenia due to splenomegaly. Since last seen, Patient has some dyspnea on exertion and pedal edema. No orthopnea, chest pain, palpitations or syncope.  Current Outpatient Prescriptions  Medication Sig Dispense Refill  . BD INSULIN SYRINGE ULTRAFINE 31G X 15/64" 0.5 ML MISC     . carvedilol (COREG) 12.5 MG tablet Take 1 tablet (12.5 mg total) by mouth 2 (two) times daily with a meal. Needs to make a yearly appointment 180 tablet 3  . clobetasol ointment (TEMOVATE) 7.51 % Apply 1 application topically as needed.    . Coenzyme Q10 200 MG TABS Take 1 tablet by mouth daily.    . Cranberry 200 MG CAPS Take 300 mg by mouth daily.     . empagliflozin (JARDIANCE) 25 MG TABS tablet Take 25  mg by mouth daily. 90 tablet 1  . ergocalciferol (VITAMIN D2) 50000 units capsule Take 50,000 Units by mouth once a week.    . ezetimibe (ZETIA) 10 MG tablet Take 1 tablet (10 mg total) by mouth daily. 90 tablet 3  . fenofibrate (TRICOR) 48 MG tablet Take 1 tablet (48 mg total) by mouth daily. 90 tablet 3  . furosemide (LASIX) 40 MG tablet Take 1 tablet (40 mg total) by mouth daily. 180 tablet 3  . insulin regular human CONCENTRATED (HUMULIN R) 500 UNIT/ML injection Use up to 200 units (0.4 mL) a day as advised. 60 mL 1  . Insulin Syringe/Needle U-500 (BD INSULIN SYRINGE U-500) 31G X 6MM 0.5 ML MISC Inject 2 each into the skin 2 (two) times daily with a meal. 200 each 3  . metFORMIN (GLUCOPHAGE-XR) 500 MG 24 hr tablet Take 2000 mg by mouth with dinner 360 tablet 1  . montelukast (SINGULAIR) 10 MG tablet Take 10 mg by mouth at bedtime.    . Multiple Vitamins-Minerals (CENTRUM SILVER PO) Take 1 tablet by mouth daily.    . nitroGLYCERIN (NITROSTAT) 0.4 MG SL tablet Place 1 tablet (0.4 mg total) under the tongue every 5 (five) minutes as needed for chest pain. 100 tablet 0  . olmesartan (BENICAR) 40 MG tablet Take 2 tablets (80 mg total) by mouth daily. 180 tablet 3  . omega-3 acid ethyl esters (LOVAZA) 1 G capsule Take 2 capsules (2 g total) by mouth 2 (two) times daily. 360 capsule 3  . polyethylene glycol (MIRALAX / GLYCOLAX) packet Take 17 g  by mouth daily as needed for mild constipation. 14 each 0  . potassium chloride (K-DUR,KLOR-CON) 10 MEQ tablet Take 2 tablets (20 mEq total) by mouth 2 (two) times daily. 360 tablet 0  . pravastatin (PRAVACHOL) 80 MG tablet Take 1 tablet (80 mg total) by mouth daily. 90 tablet 3  . Probiotic Product (PROBIOTIC DAILY PO) Take 1 tablet by mouth daily.    . rivaroxaban (XARELTO) 20 MG TABS tablet Take 1 tablet (20 mg total) by mouth daily with supper. 90 tablet 3  . senna (SENOKOT) 8.6 MG TABS tablet Take 1 tablet (8.6 mg total) by mouth 2 (two) times daily. 120  each 0  . spironolactone (ALDACTONE) 25 MG tablet Take 1 tablet (25 mg total) by mouth daily. 90 tablet 3   No current facility-administered medications for this visit.     Past Medical History  Diagnosis Date  . CAD (coronary artery disease)   . Atrial fibrillation ([place])     takes Xarelto daily  . Hyperlipidemia     takes Sanmina-SCI daily  . Obstructive sleep apnea   . Nephrolithiasis   . Thrombocytopenia ([place])   . Cholelithiasis   . Spinal stenosis   . ICD (implantable cardioverter-defibrillator) battery depletion   . Ischemic cardiomyopathy   . Congestive heart failure ([place])     takes Spironolactone daily  . Hypertension     takes Benicar and Coreg daily  . Myocardial infarction ([place]) 1997    on the OR table   . Complication of anesthesia     forgetful for about 2-3wks after anesthesia  . Pneumonia     as a child  . Peripheral neuropathy ([place])   . Arthritis   . Joint pain   . Chronic back pain     scoliosis/arthritis  . History of colon polyps   . Urinary frequency   . Urinary urgency   . History of kidney stones   . Diabetes mellitus ([place])     takes Invokamet daily as well as Hum U  . Cardiomyopathy, ischemic 11/07/2013  . Hyperlipidemia 11/07/2013  . Ischemic cardiomyopathy   . ICD (implantable cardioverter-defibrillator) in place 11/07/2013  . Type 2 diabetes mellitus with stage 3 chronic kidney disease ([place]) 05/19/2015  . Total knee replacement status 11/29/2014    Past Surgical History  Procedure Laterality Date  . Coronary artery bypass and graft  1997    x 5  . Right rotator cuff surgery    . Total knee arthroplasty Left   . Laminectomy    . Tonsillectomy      adenoids  . Icd placed    . Colonoscopy    . Esophagogastroduodenoscopy    . Lithotripsy    . Total knee arthroplasty Right 11/29/2014    Procedure: RIGHT TOTAL KNEE ARTHROPLASTY;  Surgeon: Leandrew Koyanagi, MD;  Location: [facility];  Service: Orthopedics;  Laterality: Right;     Social History   Social History  . Marital Status: Married    Spouse Name: N/A  . Number of Children: N/A  . Years of Education: N/A   Occupational History  .      Retired from [employer]  . Smoking status: Never Smoker   . Smokeless tobacco: Not on file  . Alcohol Use: 0.0 oz/week    0 Standard drinks or equivalent per week     Comment: once drink a month  . Drug Use: No  . Sexual Activity: Not  on file   Other Topics Concern  . Not on file   Social History Narrative    ROS: Back pain but no fevers or chills, productive cough, hemoptysis, dysphasia, odynophagia, melena, hematochezia, dysuria, hematuria, rash, seizure activity, orthopnea, PND, claudication. Remaining systems are negative.  Physical Exam: Well-developed obese in no acute distress.  Skin is warm and dry.  HEENT is normal.  Neck is supple.  Chest is clear to auscultation with normal expansion.  Cardiovascular exam is regular rate and rhythm.  Abdominal exam nontender or distended. No masses palpated. Extremities show 1+ edema. neuro grossly intact

## 2015-09-24 ENCOUNTER — Ambulatory Visit (INDEPENDENT_AMBULATORY_CARE_PROVIDER_SITE_OTHER): Payer: Medicare Other | Admitting: Cardiology

## 2015-09-24 ENCOUNTER — Encounter: Payer: Self-pay | Admitting: Cardiology

## 2015-09-24 ENCOUNTER — Encounter: Payer: Self-pay | Admitting: *Deleted

## 2015-09-24 ENCOUNTER — Other Ambulatory Visit: Payer: Self-pay | Admitting: *Deleted

## 2015-09-24 DIAGNOSIS — E785 Hyperlipidemia, unspecified: Secondary | ICD-10-CM

## 2015-09-24 DIAGNOSIS — R9439 Abnormal result of other cardiovascular function study: Secondary | ICD-10-CM

## 2015-09-24 DIAGNOSIS — R931 Abnormal findings on diagnostic imaging of heart and coronary circulation: Secondary | ICD-10-CM

## 2015-09-24 DIAGNOSIS — I10 Essential (primary) hypertension: Secondary | ICD-10-CM | POA: Diagnosis not present

## 2015-09-24 DIAGNOSIS — I255 Ischemic cardiomyopathy: Secondary | ICD-10-CM | POA: Diagnosis not present

## 2015-09-24 DIAGNOSIS — I480 Paroxysmal atrial fibrillation: Secondary | ICD-10-CM | POA: Diagnosis not present

## 2015-09-24 MED ORDER — FAMOTIDINE 20 MG PO TABS: ORAL_TABLET | ORAL | Status: DC

## 2015-09-24 MED ORDER — PREDNISONE 20 MG PO TABS: ORAL_TABLET | ORAL | Status: DC

## 2015-09-24 NOTE — Assessment & Plan Note (Signed)
Continue statin. Nuclear study shows ischemia in the anterior and inferior walls. Plan to proceed with cardiac catheterization. The risks and benefits were discussed including renal insufficiency. The patient agrees to proceed. Hold anticoagulation prior to procedure. Hold Lasix and spironolactone 48 hours prior to procedure and resume the day after. Hold Glucophage the day of procedure and 48 hours after. Premedicate for dye allergy. We will check patient's potassium and renal function 48 hours after procedure. Gentle hydration prior to procedure. No ventriculogram.

## 2015-09-24 NOTE — Assessment & Plan Note (Signed)
Plan to continue beta blocker and xarelto. Hold anticoagulation 72 hours prior to catheterization and resume the day after.

## 2015-09-24 NOTE — Patient Instructions (Signed)
Your physician has requested that you have a cardiac catheterization. Cardiac catheterization is used to diagnose and/or treat various heart conditions. Doctors may recommend this procedure for a number of different reasons. The most common reason is to evaluate chest pain. Chest pain can be a symptom of coronary artery disease (CAD), and cardiac catheterization can show whether plaque is narrowing or blocking your heart's arteries. This procedure is also used to evaluate the valves, as well as measure the blood flow and oxygen levels in different parts of your heart. For further information please visit [email or web address]. Please follow instruction sheet, as given.   Your physician recommends that you schedule a follow-up appointment in: [place]

## 2015-09-24 NOTE — Assessment & Plan Note (Signed)
Blood pressure controlled. Continue present medications. 

## 2015-09-24 NOTE — Assessment & Plan Note (Signed)
Continue present medications. 

## 2015-09-24 NOTE — Assessment & Plan Note (Signed)
Continue beta blocker and ARB. 

## 2015-09-24 NOTE — Assessment & Plan Note (Signed)
Followed by electrophysiology. 

## 2015-09-24 NOTE — Addendum Note (Signed)
Addended by: Cristopher Estimable on: 09/24/2015 11:15 AM   Modules accepted: Orders

## 2015-09-24 NOTE — Assessment & Plan Note (Signed)
Previously felt secondary to splenomegaly per hematology oncology. His platelet count was adequate for cardiac catheterization. Will recheck prior to procedure.

## 2015-09-30 ENCOUNTER — Encounter: Payer: Self-pay | Admitting: Internal Medicine

## 2015-09-30 ENCOUNTER — Encounter: Payer: Self-pay | Admitting: Cardiology

## 2015-09-30 DIAGNOSIS — R931 Abnormal findings on diagnostic imaging of heart and coronary circulation: Secondary | ICD-10-CM | POA: Diagnosis not present

## 2015-09-30 DIAGNOSIS — R9439 Abnormal result of other cardiovascular function study: Secondary | ICD-10-CM | POA: Diagnosis not present

## 2015-10-01 ENCOUNTER — Telehealth: Payer: Self-pay | Admitting: *Deleted

## 2015-10-01 LAB — BASIC METABOLIC PANEL
BUN: 32 mg/dL — AB (ref 7–25)
CO2: 24 mmol/L (ref 20–31)
CREATININE: 1.25 mg/dL — AB (ref 0.70–1.18)
Calcium: 9.7 mg/dL (ref 8.6–10.3)
Chloride: 100 mmol/L (ref 98–110)
GLUCOSE: 249 mg/dL — AB (ref 65–99)
POTASSIUM: 4.4 mmol/L (ref 3.5–5.3)
Sodium: 136 mmol/L (ref 135–146)

## 2015-10-01 LAB — CBC
HEMATOCRIT: 42.5 % (ref 39.0–52.0)
Hemoglobin: 13.9 g/dL (ref 13.0–17.0)
MCH: 30.3 pg (ref 26.0–34.0)
MCHC: 32.7 g/dL (ref 30.0–36.0)
MCV: 92.8 fL (ref 78.0–100.0)
MPV: 12.1 fL (ref 8.6–12.4)
PLATELETS: 83 10*3/uL — AB (ref 150–400)
RBC: 4.58 MIL/uL (ref 4.22–5.81)
RDW: 15.4 % (ref 11.5–15.5)
WBC: 5.3 10*3/uL (ref 4.0–10.5)

## 2015-10-01 LAB — PROTIME-INR
INR: 1.49 (ref <1.50)
Prothrombin Time: 18.2 seconds — ABNORMAL HIGH (ref 11.6–15.2)

## 2015-10-01 NOTE — Telephone Encounter (Signed)
Robert Gregory. [name] rep, called patient regarding questions in his recent email from 09/30/15.  After discussing questions and concerns, patient denied any additional questions at this time.

## 2015-10-01 NOTE — H&P (Signed)
CABG 1997 with LIMA to LAD, SVG to diagonal, SVG to circumflex, and sequential SVG to PDA and PL. Known ejection fraction of 30% in 2005. Last cardiac cath in 2014 revealed occlusion of LAD and ostial 70% diagonal. RCA and circumflex totally occluded. Patient has ICD. An history of atrial fibrillation. Most recent EF 50% and evidence of moderate anterolateral ischemia and also with inferior infarct in. Infarct ischemia. Diuretics and Glucophage are being held. No ventriculogram.

## 2015-10-02 ENCOUNTER — Ambulatory Visit (HOSPITAL_COMMUNITY)
Admission: RE | Admit: 2015-10-02 | Discharge: 2015-10-02 | Disposition: A | Discharge status: Home / Self Care | Payer: Medicare Other | Source: Ambulatory Visit | Attending: Interventional Cardiology | Admitting: Interventional Cardiology

## 2015-10-02 ENCOUNTER — Encounter (HOSPITAL_COMMUNITY)
Admission: RE | Disposition: A | Discharge status: Home / Self Care | Payer: Self-pay | Source: Ambulatory Visit | Attending: Interventional Cardiology

## 2015-10-02 ENCOUNTER — Encounter (HOSPITAL_COMMUNITY): Payer: Self-pay | Admitting: Interventional Cardiology

## 2015-10-02 DIAGNOSIS — E785 Hyperlipidemia, unspecified: Secondary | ICD-10-CM | POA: Diagnosis not present

## 2015-10-02 DIAGNOSIS — Z87442 Personal history of urinary calculi: Secondary | ICD-10-CM | POA: Insufficient documentation

## 2015-10-02 DIAGNOSIS — Z7901 Long term (current) use of anticoagulants: Secondary | ICD-10-CM | POA: Diagnosis not present

## 2015-10-02 DIAGNOSIS — D6959 Other secondary thrombocytopenia: Secondary | ICD-10-CM | POA: Diagnosis not present

## 2015-10-02 DIAGNOSIS — Z7984 Long term (current) use of oral hypoglycemic drugs: Secondary | ICD-10-CM | POA: Insufficient documentation

## 2015-10-02 DIAGNOSIS — I2582 Chronic total occlusion of coronary artery: Secondary | ICD-10-CM | POA: Insufficient documentation

## 2015-10-02 DIAGNOSIS — R161 Splenomegaly, not elsewhere classified: Secondary | ICD-10-CM | POA: Diagnosis not present

## 2015-10-02 DIAGNOSIS — I4891 Unspecified atrial fibrillation: Secondary | ICD-10-CM | POA: Diagnosis not present

## 2015-10-02 DIAGNOSIS — N183 Chronic kidney disease, stage 3 (moderate): Secondary | ICD-10-CM | POA: Diagnosis not present

## 2015-10-02 DIAGNOSIS — I5042 Chronic combined systolic (congestive) and diastolic (congestive) heart failure: Secondary | ICD-10-CM | POA: Diagnosis not present

## 2015-10-02 DIAGNOSIS — G8929 Other chronic pain: Secondary | ICD-10-CM | POA: Diagnosis not present

## 2015-10-02 DIAGNOSIS — M549 Dorsalgia, unspecified: Secondary | ICD-10-CM | POA: Insufficient documentation

## 2015-10-02 DIAGNOSIS — Z86010 Personal history of colonic polyps: Secondary | ICD-10-CM | POA: Diagnosis not present

## 2015-10-02 DIAGNOSIS — E1142 Type 2 diabetes mellitus with diabetic polyneuropathy: Secondary | ICD-10-CM | POA: Diagnosis not present

## 2015-10-02 DIAGNOSIS — Z6841 Body Mass Index (BMI) 40.0 and over, adult: Secondary | ICD-10-CM | POA: Diagnosis not present

## 2015-10-02 DIAGNOSIS — I25810 Atherosclerosis of coronary artery bypass graft(s) without angina pectoris: Secondary | ICD-10-CM | POA: Diagnosis not present

## 2015-10-02 DIAGNOSIS — E669 Obesity, unspecified: Secondary | ICD-10-CM | POA: Diagnosis not present

## 2015-10-02 DIAGNOSIS — E1122 Type 2 diabetes mellitus with diabetic chronic kidney disease: Secondary | ICD-10-CM | POA: Insufficient documentation

## 2015-10-02 DIAGNOSIS — R9439 Abnormal result of other cardiovascular function study: Secondary | ICD-10-CM | POA: Insufficient documentation

## 2015-10-02 DIAGNOSIS — G4733 Obstructive sleep apnea (adult) (pediatric): Secondary | ICD-10-CM | POA: Diagnosis not present

## 2015-10-02 DIAGNOSIS — I252 Old myocardial infarction: Secondary | ICD-10-CM | POA: Diagnosis not present

## 2015-10-02 DIAGNOSIS — I2510 Atherosclerotic heart disease of native coronary artery without angina pectoris: Secondary | ICD-10-CM | POA: Diagnosis not present

## 2015-10-02 DIAGNOSIS — I255 Ischemic cardiomyopathy: Secondary | ICD-10-CM | POA: Diagnosis not present

## 2015-10-02 DIAGNOSIS — Z96652 Presence of left artificial knee joint: Secondary | ICD-10-CM | POA: Insufficient documentation

## 2015-10-02 DIAGNOSIS — Z794 Long term (current) use of insulin: Secondary | ICD-10-CM | POA: Diagnosis not present

## 2015-10-02 DIAGNOSIS — M1990 Unspecified osteoarthritis, unspecified site: Secondary | ICD-10-CM | POA: Insufficient documentation

## 2015-10-02 DIAGNOSIS — I130 Hypertensive heart and chronic kidney disease with heart failure and stage 1 through stage 4 chronic kidney disease, or unspecified chronic kidney disease: Secondary | ICD-10-CM | POA: Diagnosis not present

## 2015-10-02 HISTORY — PX: CARDIAC CATHETERIZATION: SHX172

## 2015-10-02 LAB — GLUCOSE, CAPILLARY
Glucose-Capillary: 213 mg/dL — ABNORMAL HIGH (ref 65–99)
Glucose-Capillary: 216 mg/dL — ABNORMAL HIGH (ref 65–99)

## 2015-10-02 SURGERY — LEFT HEART CATH AND CORS/GRAFTS ANGIOGRAPHY

## 2015-10-02 MED ORDER — VERAPAMIL HCL 2.5 MG/ML IV SOLN
INTRAVENOUS | Status: AC
  Filled 2015-10-02: qty 2

## 2015-10-02 MED ORDER — SODIUM CHLORIDE 0.9 % IV SOLN
INTRAVENOUS | Status: DC
  Administered 2015-10-02: 06:00:00 via INTRAVENOUS

## 2015-10-02 MED ORDER — ONDANSETRON HCL 4 MG/2ML IJ SOLN: 4.0000 mg | Freq: Four times a day (QID) | INTRAMUSCULAR | Status: DC | PRN

## 2015-10-02 MED ORDER — SODIUM CHLORIDE 0.9 % WEIGHT BASED INFUSION: 1.0000 mL/kg/h | INTRAVENOUS | Status: DC

## 2015-10-02 MED ORDER — LIDOCAINE HCL (PF) 1 % IJ SOLN
INTRAMUSCULAR | Status: AC
  Filled 2015-10-02: qty 30

## 2015-10-02 MED ORDER — MIDAZOLAM HCL 2 MG/2ML IJ SOLN
INTRAMUSCULAR | Status: AC
  Filled 2015-10-02: qty 2

## 2015-10-02 MED ORDER — VERAPAMIL HCL 2.5 MG/ML IV SOLN
INTRA_ARTERIAL | Status: DC | PRN
  Administered 2015-10-02: 08:00:00 via INTRA_ARTERIAL

## 2015-10-02 MED ORDER — DIPHENHYDRAMINE HCL 50 MG/ML IJ SOLN
25.0000 mg | INTRAMUSCULAR | Status: DC
Start: 2015-10-02 — End: 2015-10-02

## 2015-10-02 MED ORDER — SODIUM CHLORIDE 0.9 % IJ SOLN: 3.0000 mL | Freq: Two times a day (BID) | INTRAMUSCULAR | Status: DC

## 2015-10-02 MED ORDER — SODIUM CHLORIDE 0.9 % IJ SOLN: 3.0000 mL | INTRAMUSCULAR | Status: DC | PRN

## 2015-10-02 MED ORDER — SODIUM CHLORIDE 0.9 % IV SOLN: 250.0000 mL | INTRAVENOUS | Status: DC | PRN

## 2015-10-02 MED ORDER — PREDNISONE 20 MG PO TABS: 60.0000 mg | ORAL_TABLET | ORAL | Status: DC

## 2015-10-02 MED ORDER — FENTANYL CITRATE (PF) 100 MCG/2ML IJ SOLN
INTRAMUSCULAR | Status: DC | PRN
  Administered 2015-10-02: 50 ug via INTRAVENOUS
  Administered 2015-10-02: 25 ug via INTRAVENOUS

## 2015-10-02 MED ORDER — HEPARIN SODIUM (PORCINE) 1000 UNIT/ML IJ SOLN
INTRAMUSCULAR | Status: DC | PRN
  Administered 2015-10-02: 7000 [IU] via INTRAVENOUS

## 2015-10-02 MED ORDER — DIPHENHYDRAMINE HCL 50 MG/ML IJ SOLN
INTRAMUSCULAR | Status: AC
  Filled 2015-10-02: qty 1

## 2015-10-02 MED ORDER — IOHEXOL 350 MG/ML SOLN
INTRAVENOUS | Status: DC | PRN
  Administered 2015-10-02: 170 mL via INTRA_ARTERIAL

## 2015-10-02 MED ORDER — FAMOTIDINE 20 MG PO TABS: 20.0000 mg | ORAL_TABLET | ORAL | Status: DC

## 2015-10-02 MED ORDER — HEPARIN (PORCINE) IN NACL 2-0.9 UNIT/ML-% IJ SOLN
INTRAMUSCULAR | Status: AC
  Filled 2015-10-02: qty 1500

## 2015-10-02 MED ORDER — HEPARIN SODIUM (PORCINE) 1000 UNIT/ML IJ SOLN
INTRAMUSCULAR | Status: AC
  Filled 2015-10-02: qty 1

## 2015-10-02 MED ORDER — FAMOTIDINE 20 MG PO TABS
ORAL_TABLET | ORAL | Status: AC
  Filled 2015-10-02: qty 1

## 2015-10-02 MED ORDER — NITROGLYCERIN 1 MG/10 ML FOR IR/CATH LAB: INTRA_ARTERIAL | Status: DC | PRN

## 2015-10-02 MED ORDER — FENTANYL CITRATE (PF) 100 MCG/2ML IJ SOLN
INTRAMUSCULAR | Status: AC
  Filled 2015-10-02: qty 2

## 2015-10-02 MED ORDER — LIDOCAINE HCL (PF) 1 % IJ SOLN
INTRAMUSCULAR | Status: DC | PRN
  Administered 2015-10-02: 09:00:00

## 2015-10-02 MED ORDER — MIDAZOLAM HCL 2 MG/2ML IJ SOLN
INTRAMUSCULAR | Status: DC | PRN
  Administered 2015-10-02 (×3): 1 mg via INTRAVENOUS

## 2015-10-02 MED ORDER — ACETAMINOPHEN 325 MG PO TABS: 650.0000 mg | ORAL_TABLET | ORAL | Status: DC | PRN

## 2015-10-02 MED ORDER — OXYCODONE-ACETAMINOPHEN 5-325 MG PO TABS: 1.0000 | ORAL_TABLET | ORAL | Status: DC | PRN

## 2015-10-02 SURGICAL SUPPLY — 10 items
CATH INFINITI 5 FR MPA2 (CATHETERS) ×2 IMPLANT
CATH INFINITI 5FR MULTPACK ANG (CATHETERS) ×2 IMPLANT
DEVICE RAD COMP TR BAND LRG (VASCULAR PRODUCTS) ×3 IMPLANT
GLIDESHEATH SLEND A-KIT 6F 22G (SHEATH) ×3 IMPLANT
KIT HEART LEFT (KITS) ×3 IMPLANT
PACK CARDIAC CATHETERIZATION (CUSTOM PROCEDURE TRAY) ×3 IMPLANT
TRANSDUCER W/STOPCOCK (MISCELLANEOUS) ×3 IMPLANT
TUBING CIL FLEX 10 FLL-RA (TUBING) ×3 IMPLANT
WIRE HITORQ VERSACORE ST 145CM (WIRE) ×2 IMPLANT
WIRE SAFE-T 1.5MM-J .035X260CM (WIRE) ×3 IMPLANT

## 2015-10-02 NOTE — Discharge Instructions (Signed)
Radial Site Care °Refer to this sheet in the next few weeks. These instructions provide you with information about caring for yourself after your procedure. Your health care provider may also give you more specific instructions. Your treatment has been planned according to current medical practices, but problems sometimes occur. Call your health care provider if you have any problems or questions after your procedure. °WHAT TO EXPECT AFTER THE PROCEDURE °After your procedure, it is typical to have the following: °· Bruising at the radial site that usually fades within 1-2 weeks. °· Blood collecting in the tissue (hematoma) that may be painful to the touch. It should usually decrease in size and tenderness within 1-2 weeks. °HOME CARE INSTRUCTIONS °· Take medicines only as directed by your health care provider. °· You may shower 24-48 hours after the procedure or as directed by your health care provider. Remove the bandage (dressing) and gently wash the site with plain soap and water. Pat the area dry with a clean towel. Do not rub the site, because this may cause bleeding. °· Do not take baths, swim, or use a hot tub until your health care provider approves. °· Check your insertion site every day for redness, swelling, or drainage. °· Do not apply powder or lotion to the site. °· Do not flex or bend the affected arm for 24 hours or as directed by your health care provider. °· Do not push or pull heavy objects with the affected arm for 24 hours or as directed by your health care provider. °· Do not lift over 10 lb (4.5 kg) for 5 days after your procedure or as directed by your health care provider. °· Ask your health care provider when it is okay to: °¨ Return to work or school. °¨ Resume usual physical activities or sports. °¨ Resume sexual activity. °· Do not drive home if you are discharged the same day as the procedure. Have someone else drive you. °· You may drive 24 hours after the procedure unless otherwise  instructed by your health care provider. °· Do not operate machinery or power tools for 24 hours after the procedure. °· If your procedure was done as an outpatient procedure, which means that you went home the same day as your procedure, a responsible adult should be with you for the first 24 hours after you arrive home. °· Keep all follow-up visits as directed by your health care provider. This is important. °SEEK MEDICAL CARE IF: °· You have a fever. °· You have chills. °· You have increased bleeding from the radial site. Hold pressure on the site. °SEEK IMMEDIATE MEDICAL CARE IF: °· You have unusual pain at the radial site. °· You have redness, warmth, or swelling at the radial site. °· You have drainage (other than a small amount of blood on the dressing) from the radial site. °· The radial site is bleeding, and the bleeding does not stop after 30 minutes of holding steady pressure on the site. °· Your arm or hand becomes pale, cool, tingly, or numb. °  °This information is not intended to replace advice given to you by your health care provider. Make sure you discuss any questions you have with your health care provider. °  °Document Released: 10/09/2010 Document Revised: 09/27/2014 Document Reviewed: 03/25/2014 °Elsevier Interactive Patient Education ©2016 [employer] ° °

## 2015-10-02 NOTE — Interval H&P Note (Signed)
Cath Lab Visit (complete for each Cath Lab visit)  Clinical Evaluation Leading to the Procedure:   ACS: No.  Non-ACS:    Anginal Classification: CCS III  Anti-ischemic medical therapy: Maximal Therapy (2 or more classes of medications)  Non-Invasive Test Results: Intermediate-risk stress test findings: cardiac mortality 1-3%/year  Prior CABG: Previous CABG      History and Physical Interval Note:  10/02/2015 7:40 AM  Robert Gregory  has presented today for surgery, with the diagnosis of abnormal nuc  The various methods of treatment have been discussed with the patient and family. After consideration of risks, benefits and other options for treatment, the patient has consented to  Procedure(s): Left Heart Cath and Cors/Grafts Angiography (N/A) as a surgical intervention .  The patient's history has been reviewed, patient examined, no change in status, stable for surgery.  I have reviewed the patient's chart and labs.  Questions were answered to the patient's satisfaction.     Sinclair Grooms

## 2015-10-02 NOTE — H&P (View-Only) (Signed)
HPI: FU coronary artery disease. Patient is status post coronary artery bypassing graft in 1997. Patient had a LIMA to the LAD, saphenous vein graft to the diagonal, saphenous vein graft to the circumflex and sequential saphenous vein graft to the PDA and posterior lateral. His procedure was performed at [facility]. He subsequently moved to [facility]. Echocardiogram in 2005 showed an ejection fraction of 38%, restrictive filling and trace tricuspid regurgitation. Cardiac catheterization in March 2014 revealed occlusion of the LAD with a 70% ostial diagonal. The circumflex and right coronary artery were occluded. The saphenous vein graft to the circumflex was occluded. Saphenous vein graft to the diagonal occluded. The graft to the PDA and posterior lateral was patent. The LIMA to the LAD was patent. There were left to left collaterals filling marginal branches and diagonal branch. Ejection fraction 30-35%. Patient treated medically. Patient had ICD placed at that time. Also with h/o atrial fibrillation. Abdominal ultrasound August 2016 showed no aneurysm. Nuclear study 12/16 showed EF 50; prior inferior MI with mild peri-infarct ischemia and moderate ischemia in the anterolateral wall. Note pt with chronic thrombocytopenia due to splenomegaly. Since last seen, Patient has some dyspnea on exertion and pedal edema. No orthopnea, chest pain, palpitations or syncope.  Current Outpatient Prescriptions  Medication Sig Dispense Refill  . BD INSULIN SYRINGE ULTRAFINE 31G X 15/64" 0.5 ML MISC     . carvedilol (COREG) 12.5 MG tablet Take 1 tablet (12.5 mg total) by mouth 2 (two) times daily with a meal. Needs to make a yearly appointment 180 tablet 3  . clobetasol ointment (TEMOVATE) 2.33 % Apply 1 application topically as needed.    . Coenzyme Q10 200 MG TABS Take 1 tablet by mouth daily.    . Cranberry 200 MG CAPS Take 300 mg by mouth daily.     . empagliflozin (JARDIANCE) 25 MG TABS tablet Take 25  mg by mouth daily. 90 tablet 1  . ergocalciferol (VITAMIN D2) 50000 units capsule Take 50,000 Units by mouth once a week.    . ezetimibe (ZETIA) 10 MG tablet Take 1 tablet (10 mg total) by mouth daily. 90 tablet 3  . fenofibrate (TRICOR) 48 MG tablet Take 1 tablet (48 mg total) by mouth daily. 90 tablet 3  . furosemide (LASIX) 40 MG tablet Take 1 tablet (40 mg total) by mouth daily. 180 tablet 3  . insulin regular human CONCENTRATED (HUMULIN R) 500 UNIT/ML injection Use up to 200 units (0.4 mL) a day as advised. 60 mL 1  . Insulin Syringe/Needle U-500 (BD INSULIN SYRINGE U-500) 31G X 6MM 0.5 ML MISC Inject 2 each into the skin 2 (two) times daily with a meal. 200 each 3  . metFORMIN (GLUCOPHAGE-XR) 500 MG 24 hr tablet Take 2000 mg by mouth with dinner 360 tablet 1  . montelukast (SINGULAIR) 10 MG tablet Take 10 mg by mouth at bedtime.    . Multiple Vitamins-Minerals (CENTRUM SILVER PO) Take 1 tablet by mouth daily.    . nitroGLYCERIN (NITROSTAT) 0.4 MG SL tablet Place 1 tablet (0.4 mg total) under the tongue every 5 (five) minutes as needed for chest pain. 100 tablet 0  . olmesartan (BENICAR) 40 MG tablet Take 2 tablets (80 mg total) by mouth daily. 180 tablet 3  . omega-3 acid ethyl esters (LOVAZA) 1 G capsule Take 2 capsules (2 g total) by mouth 2 (two) times daily. 360 capsule 3  . polyethylene glycol (MIRALAX / GLYCOLAX) packet Take 17 g  by mouth daily as needed for mild constipation. 14 each 0  . potassium chloride (K-DUR,KLOR-CON) 10 MEQ tablet Take 2 tablets (20 mEq total) by mouth 2 (two) times daily. 360 tablet 0  . pravastatin (PRAVACHOL) 80 MG tablet Take 1 tablet (80 mg total) by mouth daily. 90 tablet 3  . Probiotic Product (PROBIOTIC DAILY PO) Take 1 tablet by mouth daily.    . rivaroxaban (XARELTO) 20 MG TABS tablet Take 1 tablet (20 mg total) by mouth daily with supper. 90 tablet 3  . senna (SENOKOT) 8.6 MG TABS tablet Take 1 tablet (8.6 mg total) by mouth 2 (two) times daily. 120  each 0  . spironolactone (ALDACTONE) 25 MG tablet Take 1 tablet (25 mg total) by mouth daily. 90 tablet 3   No current facility-administered medications for this visit.     Past Medical History  Diagnosis Date  . CAD (coronary artery disease)   . Atrial fibrillation ([place])     takes Xarelto daily  . Hyperlipidemia     takes San[employer]-SCI daily  . Obstructive sleep apnea   . Nephrolithiasis   . Thrombocytopenia ([place])   . Cholelithiasis   . Spinal stenosis   . ICD (implantable cardioverter-defibrillator) battery depletion   . Ischemic cardiomyopathy   . Congestive heart failure ([place])     takes Spironolactone daily  . Hypertension     takes Benicar and Coreg daily  . Myocardial infarction ([place]) 1997    on the OR table   . Complication of anesthesia     forgetful for about 2-3wks after anesthesia  . Pneumonia     as a child  . Peripheral neuropathy ([place])   . Arthritis   . Joint pain   . Chronic back pain     scoliosis/arthritis  . History of colon polyps   . Urinary frequency   . Urinary urgency   . History of kidney stones   . Diabetes mellitus ([place])     takes Invokamet daily as well as Hum U  . Cardiomyopathy, ischemic 11/07/2013  . Hyperlipidemia 11/07/2013  . Ischemic cardiomyopathy   . ICD (implantable cardioverter-defibrillator) in place 11/07/2013  . Type 2 diabetes mellitus with stage 3 chronic kidney disease ([place]) 05/19/2015  . Total knee replacement status 11/29/2014    Past Surgical History  Procedure Laterality Date  . Coronary artery bypass and graft  1997    x 5  . Right rotator cuff surgery    . Total knee arthroplasty Left   . Laminectomy    . Tonsillectomy      adenoids  . Icd placed    . Colonoscopy    . Esophagogastroduodenoscopy    . Lithotripsy    . Total knee arthroplasty Right 11/29/2014    Procedure: RIGHT TOTAL KNEE ARTHROPLASTY;  Surgeon: Leandrew Koyanagi, MD;  Location: [place];  Service: Orthopedics;  Laterality: Right;     Social History   Social History  . Marital Status: Married    Spouse Name: N/A  . Number of Children: N/A  . Years of Education: N/A   Occupational History  .      Retired from [employer]  . Smoking status: Never Smoker   . Smokeless tobacco: Not on file  . Alcohol Use: 0.0 oz/week    0 Standard drinks or equivalent per week     Comment: once drink a month  . Drug Use: No  . Sexual Activity: Not  on file   Other Topics Concern  . Not on file   Social History Narrative    ROS: Back pain but no fevers or chills, productive cough, hemoptysis, dysphasia, odynophagia, melena, hematochezia, dysuria, hematuria, rash, seizure activity, orthopnea, PND, claudication. Remaining systems are negative.  Physical Exam: Well-developed obese in no acute distress.  Skin is warm and dry.  HEENT is normal.  Neck is supple.  Chest is clear to auscultation with normal expansion.  Cardiovascular exam is regular rate and rhythm.  Abdominal exam nontender or distended. No masses palpated. Extremities show 1+ edema. neuro grossly intact

## 2015-10-06 DIAGNOSIS — R931 Abnormal findings on diagnostic imaging of heart and coronary circulation: Secondary | ICD-10-CM | POA: Diagnosis not present

## 2015-10-06 DIAGNOSIS — R9439 Abnormal result of other cardiovascular function study: Secondary | ICD-10-CM | POA: Diagnosis not present

## 2015-10-07 LAB — BASIC METABOLIC PANEL
BUN: 28 mg/dL — ABNORMAL HIGH (ref 7–25)
CHLORIDE: 103 mmol/L (ref 98–110)
CO2: 27 mmol/L (ref 20–31)
CREATININE: 1.29 mg/dL — AB (ref 0.70–1.18)
Calcium: 9 mg/dL (ref 8.6–10.3)
Glucose, Bld: 277 mg/dL — ABNORMAL HIGH (ref 65–99)
POTASSIUM: 4.3 mmol/L (ref 3.5–5.3)
SODIUM: 139 mmol/L (ref 135–146)

## 2015-11-05 ENCOUNTER — Encounter: Payer: Self-pay | Admitting: Cardiology

## 2015-11-12 ENCOUNTER — Ambulatory Visit (INDEPENDENT_AMBULATORY_CARE_PROVIDER_SITE_OTHER): Payer: Medicare Other | Admitting: Cardiology

## 2015-11-12 ENCOUNTER — Encounter: Payer: Self-pay | Admitting: Cardiology

## 2015-11-12 VITALS — BP 143/69 | HR 76 | Ht 73.0 in | Wt 339.0 lb

## 2015-11-12 DIAGNOSIS — I2510 Atherosclerotic heart disease of native coronary artery without angina pectoris: Secondary | ICD-10-CM

## 2015-11-12 DIAGNOSIS — Z95810 Presence of automatic (implantable) cardiac defibrillator: Secondary | ICD-10-CM

## 2015-11-12 DIAGNOSIS — I10 Essential (primary) hypertension: Secondary | ICD-10-CM | POA: Diagnosis not present

## 2015-11-12 DIAGNOSIS — I255 Ischemic cardiomyopathy: Secondary | ICD-10-CM | POA: Diagnosis not present

## 2015-11-12 DIAGNOSIS — I480 Paroxysmal atrial fibrillation: Secondary | ICD-10-CM | POA: Diagnosis not present

## 2015-11-12 DIAGNOSIS — E785 Hyperlipidemia, unspecified: Secondary | ICD-10-CM

## 2015-11-12 DIAGNOSIS — I2583 Coronary atherosclerosis due to lipid rich plaque: Secondary | ICD-10-CM

## 2015-11-12 MED ORDER — POTASSIUM CHLORIDE CRYS ER 10 MEQ PO TBCR: 20.0000 meq | EXTENDED_RELEASE_TABLET | Freq: Two times a day (BID) | ORAL | Status: DC

## 2015-11-12 MED ORDER — NITROGLYCERIN 0.4 MG SL SUBL: 0.4000 mg | SUBLINGUAL_TABLET | SUBLINGUAL | Status: DC | PRN

## 2015-11-12 NOTE — Assessment & Plan Note (Signed)
Blood pressure controlled. Continue present medications. 

## 2015-11-12 NOTE — Progress Notes (Signed)
HPI: FU coronary artery disease. Patient is status post coronary artery bypassing graft in 1997. Patient had a LIMA to the LAD, saphenous vein graft to the diagonal, saphenous vein graft to the circumflex and sequential saphenous vein graft to the PDA and posterior lateral. His procedure was performed at [facility]. He subsequently moved to [facility]. Echocardiogram in 2005 showed an ejection fraction of 38%, restrictive filling and trace tricuspid regurgitation. Patient had ICD placed previously. Also with h/o atrial fibrillation. Abdominal ultrasound August 2016 showed no aneurysm. Nuclear study 12/16 showed EF 50; prior inferior MI with mild peri-infarct ischemia and moderate ischemia in the anterolateral wall. Cardiac catheterization January 2017 showed severe three-vessel coronary disease. There was total occlusion of the saphenous vein graft to the obtuse marginal, total occlusion of the saphenous vein graft to diagonal and there was a 50-70% stenosis in the sequential vein graft to the PDA and left ventricular branch area the LIMA to the LAD was patent. Ejection fraction 35-45%. Medical therapy recommended. Since last seen, the patient has dyspnea with more extreme activities but not with routine activities. It is relieved with rest. It is not associated with chest pain. There is no orthopnea, PND. There is no syncope or palpitations. There is no exertional chest pain. Mild pedal edema.   Current Outpatient Prescriptions  Medication Sig Dispense Refill  . BD INSULIN SYRINGE ULTRAFINE 31G X 15/64" 0.5 ML MISC     . carvedilol (COREG) 12.5 MG tablet Take 1 tablet (12.5 mg total) by mouth 2 (two) times daily with a meal. Needs to make a yearly appointment 180 tablet 3  . Coenzyme Q10 200 MG TABS Take 1 tablet by mouth daily.    . Cranberry 200 MG CAPS Take 200 mg by mouth daily.     . empagliflozin (JARDIANCE) 25 MG TABS tablet Take 25 mg by mouth daily. 90 tablet 1  . ergocalciferol  (VITAMIN D2) 50000 units capsule Take 50,000 Units by mouth once a week.    . ezetimibe (ZETIA) 10 MG tablet Take 1 tablet (10 mg total) by mouth daily. 90 tablet 3  . fenofibrate (TRICOR) 48 MG tablet Take 1 tablet (48 mg total) by mouth daily. 90 tablet 3  . furosemide (LASIX) 40 MG tablet Take 1 tablet (40 mg total) by mouth daily. 180 tablet 3  . insulin regular human CONCENTRATED (HUMULIN R) 500 UNIT/ML injection Use up to 200 units (0.4 mL) a day as advised. (Patient taking differently: Inject 6-18 Units into the skin. U 100 Needle used per pt. 18 units taken in the morning 6-8 units taken at lunch and dinner) 60 mL 1  . Insulin Syringe/Needle U-500 (BD INSULIN SYRINGE U-500) 31G X 6MM 0.5 ML MISC Inject 2 each into the skin 2 (two) times daily with a meal. 200 each 3  . metFORMIN (GLUCOPHAGE-XR) 500 MG 24 hr tablet Take 2000 mg by mouth with dinner 360 tablet 1  . montelukast (SINGULAIR) 10 MG tablet Take 10 mg by mouth at bedtime.    . Multiple Vitamins-Minerals (CENTRUM SILVER PO) Take 1 tablet by mouth daily.    . nitroGLYCERIN (NITROSTAT) 0.4 MG SL tablet Place 1 tablet (0.4 mg total) under the tongue every 5 (five) minutes as needed for chest pain. 100 tablet 0  . olmesartan (BENICAR) 40 MG tablet Take 2 tablets (80 mg total) by mouth daily. 180 tablet 3  . omega-3 acid ethyl esters (LOVAZA) 1 G capsule Take 2 capsules (2  g total) by mouth 2 (two) times daily. 360 capsule 3  . potassium chloride (K-DUR,KLOR-CON) 10 MEQ tablet Take 2 tablets (20 mEq total) by mouth 2 (two) times daily. 360 tablet 0  . pravastatin (PRAVACHOL) 80 MG tablet Take 1 tablet (80 mg total) by mouth daily. 90 tablet 3  . predniSONE (DELTASONE) 20 MG tablet TAKE 3 TABLETS THE NIGHT BEFORE AND 3 TABLETS THE MORNING OF PROCEDURE 6 tablet 0  . Probiotic Product (PROBIOTIC DAILY PO) Take 1 tablet by mouth daily.    . rivaroxaban (XARELTO) 20 MG TABS tablet Take 1 tablet (20 mg total) by mouth daily with supper. 90  tablet 3  . spironolactone (ALDACTONE) 25 MG tablet Take 1 tablet (25 mg total) by mouth daily. 90 tablet 3   No current facility-administered medications for this visit.     Past Medical History  Diagnosis Date  . CAD (coronary artery disease)   . Atrial fibrillation ([place])     takes Xarelto daily  . Hyperlipidemia     takes San[employer]I daily  . Obstructive sleep apnea   . Nephrolithiasis   . Thrombocytopenia ([place])   . Cholelithiasis   . Spinal stenosis   . ICD (implantable cardioverter-defibrillator) battery depletion   . Ischemic cardiomyopathy   . Congestive heart failure ([place])     takes Spironolactone daily  . Hypertension     takes Benicar and Coreg daily  . Myocardial infarction ([place]) 1997    on the OR table   . Complication of anesthesia     forgetful for about 2-3wks after anesthesia  . Pneumonia     as a child  . Peripheral neuropathy ([place])   . Arthritis   . Joint pain   . Chronic back pain     scoliosis/arthritis  . History of colon polyps   . Urinary frequency   . Urinary urgency   . History of kidney stones   . Diabetes mellitus ([place])     takes Invokamet daily as well as Hum U  . Cardiomyopathy, ischemic 11/07/2013  . Hyperlipidemia 11/07/2013  . Ischemic cardiomyopathy   . ICD (implantable cardioverter-defibrillator) in place 11/07/2013  . Type 2 diabetes mellitus with stage 3 chronic kidney disease ([place]) 05/19/2015  . Total knee replacement status 11/29/2014    Past Surgical History  Procedure Laterality Date  . Coronary artery bypass and graft  1997    x 5  . Right rotator cuff surgery    . Total knee arthroplasty Left   . Laminectomy    . Tonsillectomy      adenoids  . Icd placed    . Colonoscopy    . Esophagogastroduodenoscopy    . Lithotripsy    . Total knee arthroplasty Right 11/29/2014    Procedure: RIGHT TOTAL KNEE ARTHROPLASTY;  Surgeon: Leandrew Koyanagi, MD;  Location: [facility];  Service: Orthopedics;  Laterality: Right;  .  Cardiac catheterization N/A 10/02/2015    Procedure: Left Heart Cath and Cors/Grafts Angiography;  Surgeon: Belva Crome, MD;  Location: [place];  Service: Cardiovascular;  Laterality: N/A;    Social History   Social History  . Marital Status: Married    Spouse Name: N/A  . Number of Children: N/A  . Years of Education: N/A   Occupational History  .      Retired from [employer]  . Smoking status: Never Smoker   . Smokeless tobacco: Not on file  .  Alcohol Use: 0.0 oz/week    0 Standard drinks or equivalent per week     Comment: once drink a month  . Drug Use: No  . Sexual Activity: Not on file   Other Topics Concern  . Not on file   Social History Narrative    Family History  Problem Relation Age of Onset  . Heart disease      No family history    ROS: no fevers or chills, productive cough, hemoptysis, dysphasia, odynophagia, melena, hematochezia, dysuria, hematuria, rash, seizure activity, orthopnea, PND, pedal edema, claudication. Remaining systems are negative.  Physical Exam: Well-developed obese in no acute distress.  Skin is warm and dry.  HEENT is normal.  Neck is supple.  Chest is clear to auscultation with normal expansion.  Cardiovascular exam is regular rate and rhythm.  Abdominal exam nontender or distended. No masses palpated. Extremities show 1+ ankle edema. neuro grossly intact

## 2015-11-12 NOTE — Assessment & Plan Note (Signed)
Continue ARB and beta blocker. 

## 2015-11-12 NOTE — Assessment & Plan Note (Signed)
Management per electrophysiology. 

## 2015-11-12 NOTE — Patient Instructions (Signed)
Medication Instructions:   NO CHANGE  Follow-Up:  Your physician wants you to follow-up in: [place] will receive a reminder letter in the mail two months in advance. If you don't receive a letter, please call our office to schedule the follow-up appointment.

## 2015-11-12 NOTE — Assessment & Plan Note (Signed)
Continue beta blocker and xarelto.

## 2015-11-12 NOTE — Assessment & Plan Note (Signed)
Continue statin. He has not tolerated high-dose Lipitor previously.

## 2015-11-12 NOTE — Assessment & Plan Note (Signed)
Continue statin. No aspirin given need for anticoagulation.

## 2015-11-19 ENCOUNTER — Encounter: Payer: Self-pay | Admitting: Cardiology

## 2015-11-19 ENCOUNTER — Other Ambulatory Visit: Payer: Self-pay | Admitting: *Deleted

## 2015-11-19 MED ORDER — POTASSIUM CHLORIDE CRYS ER 10 MEQ PO TBCR: 20.0000 meq | EXTENDED_RELEASE_TABLET | Freq: Two times a day (BID) | ORAL | Status: DC

## 2015-11-20 ENCOUNTER — Encounter: Payer: Self-pay | Admitting: Internal Medicine

## 2015-11-20 ENCOUNTER — Other Ambulatory Visit: Payer: Self-pay | Admitting: *Deleted

## 2015-11-20 ENCOUNTER — Ambulatory Visit (INDEPENDENT_AMBULATORY_CARE_PROVIDER_SITE_OTHER): Payer: Medicare Other | Admitting: Internal Medicine

## 2015-11-20 VITALS — BP 136/76 | HR 76 | Ht 73.0 in | Wt 335.0 lb

## 2015-11-20 DIAGNOSIS — I5022 Chronic systolic (congestive) heart failure: Secondary | ICD-10-CM

## 2015-11-20 DIAGNOSIS — I255 Ischemic cardiomyopathy: Secondary | ICD-10-CM

## 2015-11-20 LAB — CUP PACEART INCLINIC DEVICE CHECK
Date Time Interrogation Session: 20170302162000
HighPow Impedance: 81 Ohm
Implantable Lead Location: 753859
Implantable Lead Location: 753860
Lead Channel Impedance Value: 425 Ohm
Lead Channel Pacing Threshold Amplitude: 0.75 V
Lead Channel Pacing Threshold Amplitude: 0.75 V
Lead Channel Pacing Threshold Pulse Width: 0.5 ms
Lead Channel Sensing Intrinsic Amplitude: 2.6 mV
Lead Channel Setting Pacing Amplitude: 2 V
Lead Channel Setting Pacing Pulse Width: 0.5 ms
MDC IDC LEAD IMPLANT DT: 20140320
MDC IDC LEAD IMPLANT DT: 20140320
MDC IDC MSMT BATTERY REMAINING LONGEVITY: 75.6
MDC IDC MSMT LEADCHNL RA IMPEDANCE VALUE: 387.5 Ohm
MDC IDC MSMT LEADCHNL RA PACING THRESHOLD AMPLITUDE: 0.75 V
MDC IDC MSMT LEADCHNL RA PACING THRESHOLD PULSEWIDTH: 0.5 ms
MDC IDC MSMT LEADCHNL RA PACING THRESHOLD PULSEWIDTH: 0.5 ms
MDC IDC MSMT LEADCHNL RV PACING THRESHOLD AMPLITUDE: 0.75 V
MDC IDC MSMT LEADCHNL RV PACING THRESHOLD PULSEWIDTH: 0.5 ms
MDC IDC MSMT LEADCHNL RV SENSING INTR AMPL: 12 mV
MDC IDC SET LEADCHNL RV SENSING SENSITIVITY: 0.5 mV
MDC IDC STAT BRADY RA PERCENT PACED: 0 %
MDC IDC STAT BRADY RV PERCENT PACED: 0.06 %
Pulse Gen Serial Number: 7067730

## 2015-11-20 MED ORDER — POTASSIUM CHLORIDE CRYS ER 10 MEQ PO TBCR: 20.0000 meq | EXTENDED_RELEASE_TABLET | Freq: Two times a day (BID) | ORAL | Status: DC

## 2015-11-20 NOTE — Progress Notes (Signed)
HPI [name] returns today for ongoing management of his ICD. He is a pleasant 75 yo man with morbide obesity, who has a h/o CAD and underwent CABG. He has a h/o atrial fibrillation. He has chronic systolic heart failure with an EF of 35%. He has DM.  His heart failure symptoms are class II, although he is quite sedentary. No ICD shocks.  Allergies  Allergen Reactions  . Adhesive [Tape]     Rash   . Iodine     flushing  . Contrast Media [Iodinated Diagnostic Agents] Rash and Other (See Comments)    flushing EXTREME FLUSHING      Current Outpatient Prescriptions  Medication Sig Dispense Refill  . amoxicillin (AMOXIL) 500 MG capsule Take 500 mg by mouth as directed. 6 prior to dental work    . carvedilol (COREG) 12.5 MG tablet Take 1 tablet (12.5 mg total) by mouth 2 (two) times daily with a meal. Needs to make a yearly appointment 180 tablet 3  . Coenzyme Q10 200 MG TABS Take 1 tablet by mouth daily.    . Cranberry 200 MG CAPS Take 200 mg by mouth daily.     . empagliflozin (JARDIANCE) 25 MG TABS tablet Take 25 mg by mouth daily. 90 tablet 1  . ergocalciferol (VITAMIN D2) 50000 units capsule Take 50,000 Units by mouth once a week.    . ezetimibe (ZETIA) 10 MG tablet Take 1 tablet (10 mg total) by mouth daily. 90 tablet 3  . fenofibrate (TRICOR) 48 MG tablet Take 1 tablet (48 mg total) by mouth daily. 90 tablet 3  . furosemide (LASIX) 40 MG tablet Take 1 tablet (40 mg total) by mouth daily. 180 tablet 3  . insulin regular human CONCENTRATED (HUMULIN R) 500 UNIT/ML injection Use up to 200 units (0.4 mL) a day as advised. (Patient taking differently: Inject 6-18 Units into the skin. U 100 Needle used per pt. 18 units taken in the morning 6-8 units taken at lunch and dinner) 60 mL 1  . Insulin Syringe/Needle U-500 (BD INSULIN SYRINGE U-500) 31G X 6MM 0.5 ML MISC Inject 2 each into the skin 2 (two) times daily with a meal. 200 each 3  . metFORMIN (GLUCOPHAGE-XR) 500 MG 24 hr tablet  Take 2000 mg by mouth with dinner 360 tablet 1  . montelukast (SINGULAIR) 10 MG tablet Take 10 mg by mouth at bedtime.    . Multiple Vitamins-Minerals (CENTRUM SILVER PO) Take 1 tablet by mouth daily.    . nitroGLYCERIN (NITROSTAT) 0.4 MG SL tablet Place 1 tablet (0.4 mg total) under the tongue every 5 (five) minutes as needed for chest pain. 25 tablet 3  . NON FORMULARY Methyfolate 2000 MCG daily    . olmesartan (BENICAR) 40 MG tablet Take 2 tablets (80 mg total) by mouth daily. 180 tablet 3  . omega-3 acid ethyl esters (LOVAZA) 1 G capsule Take 2 capsules (2 g total) by mouth 2 (two) times daily. 360 capsule 3  . potassium chloride (K-DUR,KLOR-CON) 10 MEQ tablet Take 2 tablets (20 mEq total) by mouth 2 (two) times daily. 360 tablet 3  . pravastatin (PRAVACHOL) 80 MG tablet Take 1 tablet (80 mg total) by mouth daily. 90 tablet 3  . Probiotic Product (PROBIOTIC DAILY PO) Take 1 tablet by mouth daily.    . rivaroxaban (XARELTO) 20 MG TABS tablet Take 1 tablet (20 mg total) by mouth daily with supper. 90 tablet 3  . spironolactone (ALDACTONE) 25 MG  tablet Take 1 tablet (25 mg total) by mouth daily. 90 tablet 3   No current facility-administered medications for this visit.     Past Medical History  Diagnosis Date  . CAD (coronary artery disease)   . Atrial fibrillation ([place])     takes Xarelto daily  . Hyperlipidemia     takes Sanmina-SCI daily  . Obstructive sleep apnea   . Nephrolithiasis   . Thrombocytopenia ([place])   . Cholelithiasis   . Spinal stenosis   . ICD (implantable cardioverter-defibrillator) battery depletion   . Ischemic cardiomyopathy   . Congestive heart failure ([place])     takes Spironolactone daily  . Hypertension     takes Benicar and Coreg daily  . Myocardial infarction ([place]) 1997    on the OR table   . Complication of anesthesia     forgetful for about 2-3wks after anesthesia  . Pneumonia     as a child  . Peripheral neuropathy ([place])   .  Arthritis   . Joint pain   . Chronic back pain     scoliosis/arthritis  . History of colon polyps   . Urinary frequency   . Urinary urgency   . History of kidney stones   . Diabetes mellitus ([place])     takes Invokamet daily as well as Hum U  . Cardiomyopathy, ischemic 11/07/2013  . Hyperlipidemia 11/07/2013  . Ischemic cardiomyopathy   . ICD (implantable cardioverter-defibrillator) in place 11/07/2013  . Type 2 diabetes mellitus with stage 3 chronic kidney disease ([place]) 05/19/2015  . Total knee replacement status 11/29/2014    ROS:   All systems reviewed and negative except as noted in the HPI.   Past Surgical History  Procedure Laterality Date  . Coronary artery bypass and graft  1997    x 5  . Right rotator cuff surgery    . Total knee arthroplasty Left   . Laminectomy    . Tonsillectomy      adenoids  . Icd placed    . Colonoscopy    . Esophagogastroduodenoscopy    . Lithotripsy    . Total knee arthroplasty Right 11/29/2014    Procedure: RIGHT TOTAL KNEE ARTHROPLASTY;  Surgeon: Leandrew Koyanagi, MD;  Location: [facility];  Service: Orthopedics;  Laterality: Right;  . Cardiac catheterization N/A 10/02/2015    Procedure: Left Heart Cath and Cors/Grafts Angiography;  Surgeon: Belva Crome, MD;  Location: [facility];  Service: Cardiovascular;  Laterality: N/A;     Family History  Problem Relation Age of Onset  . Heart disease      No family history     Social History   Social History  . Marital Status: Married    Spouse Name: N/A  . Number of Children: N/A  . Years of Education: N/A   Occupational History  .      Retired from [employer]  . Smoking status: Never Smoker   . Smokeless tobacco: Not on file  . Alcohol Use: 0.0 oz/week    0 Standard drinks or equivalent per week     Comment: once drink a month  . Drug Use: No  . Sexual Activity: Not on file   Other Topics Concern  . Not on file   Social History Narrative      BP 136/76 mmHg  Pulse 76  Ht 6' 1"  (1.854 m)  Wt 335 lb (151.955 kg)  BMI 44.21 kg/m2  Physical Exam:  stable appearing 75 yo obese man, NAD HEENT: Unremarkable Neck:  No JVD, no thyromegally Back:  No CVA tenderness Lungs:  Clear with no wheezes, well-healed ICD incision. HEART:  Regular rate rhythm, no murmurs, no rubs, no clicks Abd:  soft, obese, positive bowel sounds, no organomegally, no rebound, no guarding Ext:  2 plus pulses, no edema, no cyanosis, no clubbing Skin:  No rashes no nodules Neuro:  CN II through XII intact, motor grossly intact   DEVICE  Normal device function.  See PaceArt for details.   Assess/Plan: 1. Chronic systolic heart failure -his symptoms remain class 2. He will continue his current meds. He is encouraged to reduce his weight and sodium intake.  2. HTN - his blood pressure is fairly well controlled. See above. 3. CAD - he denies anginal symptoms. No change in meds. Note results of last cardiac cath several weeks ago 4. ICD - is [place] ICD is working normally. Will recheck in several months. Mikle [email or web address].D.

## 2015-11-20 NOTE — Patient Instructions (Signed)
Medication Instructions:  Your physician recommends that you continue on your current medications as directed. Please refer to the Current Medication list given to you today.   Labwork: None ordered   Testing/Procedures: None ordered   Follow-Up: Your physician wants you to follow-up in: 12 months with Dr Knox Saliva will receive a reminder letter in the mail two months in advance. If you don't receive a letter, please call our office to schedule the follow-up appointment.  Remote monitoring is used to monitor your ICD from home. This monitoring reduces the number of office visits required to check your device to one time per year. It allows [place] to keep an eye on the functioning of your device to ensure it is working properly. You are scheduled for a device check from home on 02/19/16. You may send your transmission at any time that day. If you have a wireless device, the transmission will be sent automatically. After your physician reviews your transmission, you will receive a postcard with your next transmission date.    Any Other Special Instructions Will Be Listed Below (If Applicable).     If you need a refill on your cardiac medications before your next appointment, please call your pharmacy.

## 2015-11-24 ENCOUNTER — Ambulatory Visit (INDEPENDENT_AMBULATORY_CARE_PROVIDER_SITE_OTHER): Payer: Medicare Other | Admitting: Internal Medicine

## 2015-11-24 ENCOUNTER — Other Ambulatory Visit (INDEPENDENT_AMBULATORY_CARE_PROVIDER_SITE_OTHER): Payer: Medicare Other | Admitting: *Deleted

## 2015-11-24 ENCOUNTER — Encounter: Payer: Self-pay | Admitting: Internal Medicine

## 2015-11-24 VITALS — BP 138/70 | HR 115 | Temp 98.7°F | Resp 16 | Wt 340.0 lb

## 2015-11-24 DIAGNOSIS — Z794 Long term (current) use of insulin: Secondary | ICD-10-CM

## 2015-11-24 DIAGNOSIS — N183 Chronic kidney disease, stage 3 (moderate): Secondary | ICD-10-CM

## 2015-11-24 DIAGNOSIS — E1122 Type 2 diabetes mellitus with diabetic chronic kidney disease: Secondary | ICD-10-CM

## 2015-11-24 DIAGNOSIS — I255 Ischemic cardiomyopathy: Secondary | ICD-10-CM

## 2015-11-24 LAB — POCT GLYCOSYLATED HEMOGLOBIN (HGB A1C): HEMOGLOBIN A1C: 7.1

## 2015-11-24 MED ORDER — INSULIN SYRINGE/NEEDLE U-500 31G X 6MM 0.5 ML MISC: 2.0000 | Freq: Two times a day (BID) | Status: DC

## 2015-11-24 MED ORDER — METFORMIN HCL ER 500 MG PO TB24: ORAL_TABLET | ORAL | Status: DC

## 2015-11-24 MED ORDER — EMPAGLIFLOZIN 25 MG PO TABS: 25.0000 mg | ORAL_TABLET | Freq: Every day | ORAL | Status: DC

## 2015-11-24 MED ORDER — INSULIN REGULAR HUMAN (CONC) 500 UNIT/ML ~~LOC~~ SOLN: SUBCUTANEOUS | Status: DC

## 2015-11-24 NOTE — Progress Notes (Signed)
Patient ID: DENMAN PICHARDO, male   DOB: 07/07/1941, 75 y.o.   MRN: [id number]  HPI: [employer] [facility] is a 75 y.o.-year-old male, returning for f/u for DM2, dx in 2004, insulin-dependent, uncontrolled, with complications (CAD - Ischemic CMP, s/p CABG x5 in 1997, CHF; CKD stage 3). Last visit 3 mo ago.  Last hemoglobin A1c was: Lab Results  Component Value Date   HGBA1C 7.6 08/25/2015   HGBA1C 7.7 05/19/2015   HGBA1C 8.0* 12/03/2014   Pt is on a regimen of: - Metformin ER 2000 mg with dinner - Jardiance 25 mg in am - U500 insulin: - 16-18 units before breakfast - 5-6 units before lunch (added 08/2015) - 5-6 units before dinner - Cinnamon 500 mg 2x a day He gained 50 lbs with Actos in the past. He was on Invokana 100 mg in am  Pt checks his sugars 3x a day and they are: - am: 150s >> 148-186, 232, 264 >> 150-183, 221 - 2h after b'fast: n/c - before lunch: n/c >> 110-120 - 2h after lunch: n/c - before dinner: 130s >> 141-236 >> 109,128-160, 271x1 - 2h after dinner: n/c >> 126-277, 290 >> 126, 151-215 - bedtime: 150-170 (if <120 - eat a snack) >> 95, 108-230 >> 111-201 - nighttime: n/c >> 84-226 >> 80x2 No lows. Lowest sugar was 84 >> 80 x2 (late evening); he has hypoglycemia awareness at 70.  Highest sugar was 330 (rarely) - after carb-loaded meals >> 300 (chinese food) >> 220x1  Glucometer: [employer] 2  Pt's meals are: - Breakfast: cereals + 2% milk, OJ ; sausage + eggs + toast - Lunch: soup + sandwich (lightest) - Dinner: meat + veggies + starch  - Snacks: 1- pm: peanuts, V8 juice  - + mild CKD, last BUN/creatinine:  Lab Results  Component Value Date   BUN 28* 10/06/2015   CREATININE 1.29* 10/06/2015  On Benicar 80.  - last set of lipids: Lab Results  Component Value Date   CHOL 121* 08/27/2015   HDL 20* 08/27/2015   LDLCALC 31 08/27/2015   TRIG 350* 08/27/2015   CHOLHDL 6.1* 08/27/2015  On Pravachol 80. - last eye exam was in 10/2014. No DR.  - no numbness and  tingling in his feet.  ROS: Constitutional: + weight gain, no fatigue, no subjective hyperthermia Eyes: no blurry vision, no xerophthalmia ENT: no sore throat, no nodules palpated in throat, no dysphagia/odynophagia, no hoarseness Cardiovascular: no CP/SOB/no palpitations/leg swelling Respiratory: no cough/SOB Gastrointestinal: no N/V/D/C Musculoskeletal: no muscle/+ joint aches Skin: no rash Neurological: no tremors/numbness/tingling/dizziness  I reviewed pt's medications, allergies, PMH, social hx, family hx, and changes were documented in the history of present illness. Otherwise, unchanged from my initial visit note.  Past Medical History  Diagnosis Date  . CAD (coronary artery disease)   . Atrial fibrillation ([place])     takes Xarelto daily  . Hyperlipidemia     takes Sanmina-SCI daily  . Obstructive sleep apnea   . Nephrolithiasis   . Thrombocytopenia ([place])   . Cholelithiasis   . Spinal stenosis   . ICD (implantable cardioverter-defibrillator) battery depletion   . Ischemic cardiomyopathy   . Congestive heart failure ([place])     takes Spironolactone daily  . Hypertension     takes Benicar and Coreg daily  . Myocardial infarction ([place]) 1997    on the OR table   . Complication of anesthesia     forgetful for about 2-3wks after anesthesia  . Pneumonia  as a child  . Peripheral neuropathy ([place])   . Arthritis   . Joint pain   . Chronic back pain     scoliosis/arthritis  . History of colon polyps   . Urinary frequency   . Urinary urgency   . History of kidney stones   . Diabetes mellitus ([place])     takes Invokamet daily as well as Hum U  . Cardiomyopathy, ischemic 11/07/2013  . Hyperlipidemia 11/07/2013  . Ischemic cardiomyopathy   . ICD (implantable cardioverter-defibrillator) in place 11/07/2013  . Type 2 diabetes mellitus with stage 3 chronic kidney disease ([place]) 05/19/2015  . Total knee replacement status 11/29/2014   Past Surgical History   Procedure Laterality Date  . Coronary artery bypass and graft  1997    x 5  . Right rotator cuff surgery    . Total knee arthroplasty Left   . Laminectomy    . Tonsillectomy      adenoids  . Icd placed    . Colonoscopy    . Esophagogastroduodenoscopy    . Lithotripsy    . Total knee arthroplasty Right 11/29/2014    Procedure: RIGHT TOTAL KNEE ARTHROPLASTY;  Surgeon: Leandrew Koyanagi, MD;  Location: [facility];  Service: Orthopedics;  Laterality: Right;  . Cardiac catheterization N/A 10/02/2015    Procedure: Left Heart Cath and Cors/Grafts Angiography;  Surgeon: Belva Crome, MD;  Location: [facility];  Service: Cardiovascular;  Laterality: N/A;   Social History   Social History  . Marital Status: Married    Spouse Name: N/A  . Number of Children: N/A  . Years of Education: N/A   Occupational History  .      Retired from [employer]  . Smoking status: Never Smoker   . Smokeless tobacco: Not on file  . Alcohol Use: 0.0 oz/week    0 Standard drinks or equivalent per week     Comment: once drink a month  . Drug Use: No  . Sexual Activity: Not on file   Other Topics Concern  . Not on file   Social History Narrative   Current Outpatient Prescriptions on File Prior to Visit  Medication Sig Dispense Refill  . amoxicillin (AMOXIL) 500 MG capsule Take 500 mg by mouth as directed. 6 prior to dental work    . carvedilol (COREG) 12.5 MG tablet Take 1 tablet (12.5 mg total) by mouth 2 (two) times daily with a meal. Needs to make a yearly appointment 180 tablet 3  . Coenzyme Q10 200 MG TABS Take 1 tablet by mouth daily.    . Cranberry 200 MG CAPS Take 200 mg by mouth daily.     . empagliflozin (JARDIANCE) 25 MG TABS tablet Take 25 mg by mouth daily. 90 tablet 1  . ergocalciferol (VITAMIN D2) 50000 units capsule Take 50,000 Units by mouth once a week.    . ezetimibe (ZETIA) 10 MG tablet Take 1 tablet (10 mg total) by mouth daily. 90 tablet 3  .  fenofibrate (TRICOR) 48 MG tablet Take 1 tablet (48 mg total) by mouth daily. 90 tablet 3  . furosemide (LASIX) 40 MG tablet Take 1 tablet (40 mg total) by mouth daily. 180 tablet 3  . insulin regular human CONCENTRATED (HUMULIN R) 500 UNIT/ML injection Use up to 200 units (0.4 mL) a day as advised. (Patient taking differently: Inject 6-18 Units into the skin. U 100 Needle used per pt. 18 units taken in  the morning 6-8 units taken at lunch and dinner) 60 mL 1  . Insulin Syringe/Needle U-500 (BD INSULIN SYRINGE U-500) 31G X 6MM 0.5 ML MISC Inject 2 each into the skin 2 (two) times daily with a meal. 200 each 3  . metFORMIN (GLUCOPHAGE-XR) 500 MG 24 hr tablet Take 2000 mg by mouth with dinner 360 tablet 1  . montelukast (SINGULAIR) 10 MG tablet Take 10 mg by mouth at bedtime.    . Multiple Vitamins-Minerals (CENTRUM SILVER PO) Take 1 tablet by mouth daily.    . nitroGLYCERIN (NITROSTAT) 0.4 MG SL tablet Place 1 tablet (0.4 mg total) under the tongue every 5 (five) minutes as needed for chest pain. 25 tablet 3  . NON FORMULARY Methyfolate 2000 MCG daily    . olmesartan (BENICAR) 40 MG tablet Take 2 tablets (80 mg total) by mouth daily. 180 tablet 3  . omega-3 acid ethyl esters (LOVAZA) 1 G capsule Take 2 capsules (2 g total) by mouth 2 (two) times daily. 360 capsule 3  . potassium chloride (K-DUR,KLOR-CON) 10 MEQ tablet Take 2 tablets (20 mEq total) by mouth 2 (two) times daily. 360 tablet 3  . pravastatin (PRAVACHOL) 80 MG tablet Take 1 tablet (80 mg total) by mouth daily. 90 tablet 3  . Probiotic Product (PROBIOTIC DAILY PO) Take 1 tablet by mouth daily.    . rivaroxaban (XARELTO) 20 MG TABS tablet Take 1 tablet (20 mg total) by mouth daily with supper. 90 tablet 3  . spironolactone (ALDACTONE) 25 MG tablet Take 1 tablet (25 mg total) by mouth daily. 90 tablet 3   No current facility-administered medications on file prior to visit.   Allergies  Allergen Reactions  . Adhesive [Tape]     Rash    . Iodine     flushing  . Contrast Media [Iodinated Diagnostic Agents] Rash and Other (See Comments)    flushing EXTREME FLUSHING    Family History  Problem Relation Age of Onset  . Heart disease      No family history   PE: BP 138/70 mmHg  Pulse 115  Temp(Src) 98.7 F (37.1 C) (Oral)  Resp 16  Wt 340 lb (154.223 kg)  SpO2 95% Body mass index is 44.87 kg/(m^2). Wt Readings from Last 3 Encounters:  11/24/15 340 lb (154.223 kg)  11/20/15 335 lb (151.955 kg)  11/12/15 339 lb (153.769 kg)   Constitutional: obese, in NAD Eyes: PERRLA, EOMI, no exophthalmos ENT: moist mucous membranes, no thyromegaly, no cervical lymphadenopathy Cardiovascular: RRR, No MRG Respiratory: CTA B Gastrointestinal: abdomen soft, NT, ND, BS+ Musculoskeletal: no deformities, strength intact in all 4 Skin: moist, warm, no rashes Neurological: + tremor with outstretched hands, DTR normal in all 4  ASSESSMENT: 1. DM2, insulin-dependent, uncontrolled, with complications - CAD, s/p CABG 5x in 1997 (Dr Roxy Horseman), iCMP >> CHF (Dr Stanford Breed) - CKD stage 3  PLAN:  1. Patient with long-standing, uncontrolled diabetes, on oral antidiabetic regimen + U500 insulin, with improving control, especially after adding insulin with lunch, also. His sugars are higher after dinner and subsequently in am, but we are limited by his lower sugars at night >> cannot increase dinnertime insulin. I did advise him to move the U500 doses 30, rather than 15 min before a meal.   - I suggested to:  Patient Instructions  Please continue: - Metformin ER 2000 mg with dinner - Jardiance 25 mg in am - U500 insulin: - 80-90 units before breakfast - 25-30 units before lunch  -  25-30 units before dinner  Please return in 3 months with your sugar log.   - continue checking sugars at different times of the day - check 3 times a day, rotating checks - advised for yearly eye exams >> he has one in 2 weeks - has a visit with podiatrist  tomorrow - checked HbA1c today >> 7.1% (lower) - Return to clinic in 3 mo with sugar log

## 2015-11-24 NOTE — Patient Instructions (Signed)
Please continue: - Metformin ER 2000 mg with dinner - Jardiance 25 mg in am - U500 insulin: - 80-90 units before breakfast - 25-30 units before lunch  - 25-30 units before dinner  Please return in 3 months with your sugar log.

## 2015-11-25 DIAGNOSIS — M7742 Metatarsalgia, left foot: Secondary | ICD-10-CM | POA: Diagnosis not present

## 2015-11-25 DIAGNOSIS — M7741 Metatarsalgia, right foot: Secondary | ICD-10-CM | POA: Diagnosis not present

## 2015-11-25 DIAGNOSIS — L84 Corns and callosities: Secondary | ICD-10-CM | POA: Diagnosis not present

## 2015-12-04 DIAGNOSIS — G4733 Obstructive sleep apnea (adult) (pediatric): Secondary | ICD-10-CM | POA: Diagnosis not present

## 2015-12-12 DIAGNOSIS — Z1389 Encounter for screening for other disorder: Secondary | ICD-10-CM | POA: Diagnosis not present

## 2015-12-12 DIAGNOSIS — E785 Hyperlipidemia, unspecified: Secondary | ICD-10-CM | POA: Diagnosis not present

## 2015-12-12 DIAGNOSIS — Z7984 Long term (current) use of oral hypoglycemic drugs: Secondary | ICD-10-CM | POA: Diagnosis not present

## 2015-12-12 DIAGNOSIS — I2510 Atherosclerotic heart disease of native coronary artery without angina pectoris: Secondary | ICD-10-CM | POA: Diagnosis not present

## 2015-12-12 DIAGNOSIS — I5022 Chronic systolic (congestive) heart failure: Secondary | ICD-10-CM | POA: Diagnosis not present

## 2015-12-12 DIAGNOSIS — D696 Thrombocytopenia, unspecified: Secondary | ICD-10-CM | POA: Diagnosis not present

## 2015-12-12 DIAGNOSIS — Z125 Encounter for screening for malignant neoplasm of prostate: Secondary | ICD-10-CM | POA: Diagnosis not present

## 2015-12-12 DIAGNOSIS — Z0000 Encounter for general adult medical examination without abnormal findings: Secondary | ICD-10-CM | POA: Diagnosis not present

## 2015-12-12 DIAGNOSIS — E559 Vitamin D deficiency, unspecified: Secondary | ICD-10-CM | POA: Diagnosis not present

## 2015-12-12 DIAGNOSIS — E1165 Type 2 diabetes mellitus with hyperglycemia: Secondary | ICD-10-CM | POA: Diagnosis not present

## 2015-12-25 ENCOUNTER — Other Ambulatory Visit (HOSPITAL_BASED_OUTPATIENT_CLINIC_OR_DEPARTMENT_OTHER): Payer: Medicare Other

## 2015-12-25 ENCOUNTER — Ambulatory Visit (HOSPITAL_BASED_OUTPATIENT_CLINIC_OR_DEPARTMENT_OTHER): Payer: Medicare Other | Admitting: Hematology & Oncology

## 2015-12-25 ENCOUNTER — Encounter: Payer: Self-pay | Admitting: Hematology & Oncology

## 2015-12-25 VITALS — BP 140/63 | HR 78 | Temp 97.9°F | Resp 18 | Ht 73.0 in | Wt 337.0 lb

## 2015-12-25 DIAGNOSIS — D6959 Other secondary thrombocytopenia: Secondary | ICD-10-CM

## 2015-12-25 DIAGNOSIS — I4891 Unspecified atrial fibrillation: Secondary | ICD-10-CM

## 2015-12-25 DIAGNOSIS — D696 Thrombocytopenia, unspecified: Secondary | ICD-10-CM | POA: Diagnosis present

## 2015-12-25 DIAGNOSIS — R161 Splenomegaly, not elsewhere classified: Secondary | ICD-10-CM

## 2015-12-25 DIAGNOSIS — G4730 Sleep apnea, unspecified: Secondary | ICD-10-CM | POA: Diagnosis not present

## 2015-12-25 DIAGNOSIS — E119 Type 2 diabetes mellitus without complications: Secondary | ICD-10-CM | POA: Diagnosis not present

## 2015-12-25 DIAGNOSIS — D732 Chronic congestive splenomegaly: Secondary | ICD-10-CM

## 2015-12-25 LAB — CBC WITH DIFFERENTIAL (CANCER CENTER ONLY)
BASO#: 0 10*3/uL (ref 0.0–0.2)
BASO%: 0.4 % (ref 0.0–2.0)
EOS ABS: 0.2 10*3/uL (ref 0.0–0.5)
EOS%: 4.6 % (ref 0.0–7.0)
HCT: 43 % (ref 38.7–49.9)
HEMOGLOBIN: 14.5 g/dL (ref 13.0–17.1)
LYMPH#: 0.9 10*3/uL (ref 0.9–3.3)
LYMPH%: 18.5 % (ref 14.0–48.0)
MCH: 31.2 pg (ref 28.0–33.4)
MCHC: 33.7 g/dL (ref 32.0–35.9)
MCV: 93 fL (ref 82–98)
MONO#: 0.5 10*3/uL (ref 0.1–0.9)
MONO%: 10.3 % (ref 0.0–13.0)
NEUT%: 66.2 % (ref 40.0–80.0)
NEUTROS ABS: 3.1 10*3/uL (ref 1.5–6.5)
Platelets: 73 10*3/uL — ABNORMAL LOW (ref 145–400)
RBC: 4.65 10*6/uL (ref 4.20–5.70)
RDW: 15.5 % (ref 11.1–15.7)
WBC: 4.8 10*3/uL (ref 4.0–10.0)

## 2015-12-25 LAB — CHCC SATELLITE - SMEAR

## 2015-12-25 NOTE — Progress Notes (Signed)
Hematology and Oncology Follow Up Visit  Robert Gregory [id number] August 28, 1941 75 y.o. 12/25/2015   Principle Diagnosis:   Chronic thrombocytopenia secondary to splenomegaly  Current Therapy:    Observation     Interim History:  Robert Gregory is back for follow-up. We first saw him back in August. At that point time, his platelet count was 84,000. We saw him in December, his platelet count was 80,000.  He has diabetes. He has atrial fibrillation. He has sleep apnea. We did go ahead and get a ultrasound of his abdomen. This did show some splenomegaly. Because of his size, they would cannot comment too much on his liver however, that looked like the liver probably was with some fatty infiltration.  On ultrasound, his spleen was only 895 cm.  He continues on a lot of medications. He is diabetic. I suspect that these medications probably have something to do with his platelet count being a little down.  He is on Xarelto. He is on lifelong Xarelto. He is on this for atrial fibrillation.  He's had no bleeding or bruising.  He's had no change in bowel or bladder habits.  Overall, his performance status is ECOG 1.  Medications:  Current outpatient prescriptions:  .  amoxicillin (AMOXIL) 500 MG capsule, Take 500 mg by mouth as directed. 6 prior to dental work, Disp: , Rfl:  .  carvedilol (COREG) 12.5 MG tablet, Take 1 tablet (12.5 mg total) by mouth 2 (two) times daily with a meal. Needs to make a yearly appointment, Disp: 180 tablet, Rfl: 3 .  Coenzyme Q10 200 MG TABS, Take 1 tablet by mouth daily., Disp: , Rfl:  .  Cranberry 200 MG CAPS, Take 200 mg by mouth daily. , Disp: , Rfl:  .  empagliflozin (JARDIANCE) 25 MG TABS tablet, Take 25 mg by mouth daily., Disp: 90 tablet, Rfl: 1 .  ergocalciferol (VITAMIN D2) 50000 units capsule, Take 50,000 Units by mouth once a week., Disp: , Rfl:  .  ezetimibe (ZETIA) 10 MG tablet, Take 1 tablet (10 mg total) by mouth daily., Disp: 90 tablet, Rfl: 3 .   fenofibrate (TRICOR) 48 MG tablet, Take 1 tablet (48 mg total) by mouth daily., Disp: 90 tablet, Rfl: 3 .  furosemide (LASIX) 40 MG tablet, Take 1 tablet (40 mg total) by mouth daily., Disp: 180 tablet, Rfl: 3 .  insulin regular human CONCENTRATED (HUMULIN R) 500 UNIT/ML injection, Inject 80-90 units in am, and 25-30 units before lunch and dinner, Disp: 60 mL, Rfl: 1 .  Insulin Syringe/Needle U-500 (BD INSULIN SYRINGE U-500) 31G X 6MM 0.5 ML MISC, Inject 2 each into the skin 2 (two) times daily with a meal. Use 3x a day, Disp: 270 each, Rfl: 3 .  metFORMIN (GLUCOPHAGE-XR) 500 MG 24 hr tablet, Take 2000 mg by mouth with dinner, Disp: 360 tablet, Rfl: 1 .  montelukast (SINGULAIR) 10 MG tablet, Take 10 mg by mouth at bedtime., Disp: , Rfl:  .  Multiple Vitamins-Minerals (CENTRUM SILVER PO), Take 1 tablet by mouth daily., Disp: , Rfl:  .  nitroGLYCERIN (NITROSTAT) 0.4 MG SL tablet, Place 1 tablet (0.4 mg total) under the tongue every 5 (five) minutes as needed for chest pain., Disp: 25 tablet, Rfl: 3 .  NON FORMULARY, Methyfolate 2000 MCG daily, Disp: , Rfl:  .  olmesartan (BENICAR) 40 MG tablet, Take 2 tablets (80 mg total) by mouth daily., Disp: 180 tablet, Rfl: 3 .  omega-3 acid ethyl esters (LOVAZA) 1 G capsule,  Take 2 capsules (2 g total) by mouth 2 (two) times daily., Disp: 360 capsule, Rfl: 3 .  potassium chloride (K-DUR,KLOR-CON) 10 MEQ tablet, Take 2 tablets (20 mEq total) by mouth 2 (two) times daily., Disp: 360 tablet, Rfl: 3 .  pravastatin (PRAVACHOL) 80 MG tablet, Take 1 tablet (80 mg total) by mouth daily., Disp: 90 tablet, Rfl: 3 .  Probiotic Product (PROBIOTIC DAILY PO), Take 1 tablet by mouth daily., Disp: , Rfl:  .  rivaroxaban (XARELTO) 20 MG TABS tablet, Take 1 tablet (20 mg total) by mouth daily with supper., Disp: 90 tablet, Rfl: 3 .  spironolactone (ALDACTONE) 25 MG tablet, Take 1 tablet (25 mg total) by mouth daily., Disp: 90 tablet, Rfl: 3  Allergies:  Allergies  Allergen  Reactions  . Adhesive [Tape]     Rash   . Iodine     flushing  . Contrast Media [Iodinated Diagnostic Agents] Rash and Other (See Comments)    flushing EXTREME FLUSHING     Past Medical History, Surgical history, Social history, and Family History were reviewed and updated.  Review of Systems: As above  Physical Exam:  height is 6' 1"  (1.854 m) and weight is 337 lb (152.862 kg). His oral temperature is 97.9 F (36.6 C). His blood pressure is 140/63 and his pulse is 78. His respiration is 18.   Wt Readings from Last 3 Encounters:  12/25/15 337 lb (152.862 kg)  11/24/15 340 lb (154.223 kg)  11/20/15 335 lb (151.955 kg)     Morbidly obese white male in no obvious distress. Head and neck exam shows no ocular or oral lesions. He has no palpable cervical or supraclavicular lymph nodes. Lungs are clear. Cardiac exam irregular rate and irrhythm, consistent with atrial fibrillation. He has no murmurs, rubs or bruits. Abdomen is obese but soft. He has decent bowel sounds. There is no guarding or rebound tenderness. He has no fluid wave. There is no obvious hepatomegaly. I cannot palpate his spleen tip. Back exam shows no tenderness over the spine, ribs or hips. Extremities shows some chronic nonpitting edema in his lower legs. Skin exam shows no rashes, ecchymoses or petechia. Neurological exam shows no focal neurological deficits.  Lab Results  Component Value Date   WBC 4.8 12/25/2015   HGB 14.5 12/25/2015   HCT 43.0 12/25/2015   MCV 93 12/25/2015   PLT 73 Platelet count consistent in citrate* 12/25/2015     Chemistry      Component Value Date/Time   NA 139 10/06/2015 1058   K 4.3 10/06/2015 1058   CL 103 10/06/2015 1058   CO2 27 10/06/2015 1058   BUN 28* 10/06/2015 1058   CREATININE 1.29* 10/06/2015 1058   CREATININE 1.16 12/01/2014 0554      Component Value Date/Time   CALCIUM 9.0 10/06/2015 1058   ALKPHOS 48 08/27/2015 0001   AST 45* 08/27/2015 0001   ALT 42 08/27/2015  0001   BILITOT 0.6 08/27/2015 0001         Impression and Plan: Robert Gregory is a 75 year old white male. He has moderate thrombocytopenia. His platelet count is dropping a little bit. compared to 4 months ago. I think that he probably has some splenic sequestration. He also may have some medication induced thrombocytopenia. However, he needs to be on his medications.  I still do not think that he needs a bone marrow test. I looked at his blood smear. I do not see any atypical lymphocytes were immature myeloid cells. His  platelets showed some large platelets that were well granulated. He had no nucleated red cells or teardrop cells.  I think we probably get him back in another 4 months or so. We will get a ultrasound of his abdomen at that time. I want to see what his spleen looks like.  Despite the thrombocytopenia, I think his risk of bleeding is minimal. I think his platelets are very functional.   [name], MD 4/6/20175:26 PM

## 2016-01-13 DIAGNOSIS — S00432A Contusion of left ear, initial encounter: Secondary | ICD-10-CM | POA: Diagnosis not present

## 2016-01-27 DIAGNOSIS — L309 Dermatitis, unspecified: Secondary | ICD-10-CM | POA: Diagnosis not present

## 2016-01-27 DIAGNOSIS — L821 Other seborrheic keratosis: Secondary | ICD-10-CM | POA: Diagnosis not present

## 2016-01-30 DIAGNOSIS — M25531 Pain in right wrist: Secondary | ICD-10-CM | POA: Diagnosis not present

## 2016-01-31 DIAGNOSIS — J209 Acute bronchitis, unspecified: Secondary | ICD-10-CM | POA: Diagnosis not present

## 2016-02-08 DIAGNOSIS — J209 Acute bronchitis, unspecified: Secondary | ICD-10-CM | POA: Diagnosis not present

## 2016-02-19 ENCOUNTER — Ambulatory Visit (INDEPENDENT_AMBULATORY_CARE_PROVIDER_SITE_OTHER): Payer: Medicare Other | Admitting: *Deleted

## 2016-02-19 DIAGNOSIS — I255 Ischemic cardiomyopathy: Secondary | ICD-10-CM | POA: Diagnosis not present

## 2016-02-20 DIAGNOSIS — J209 Acute bronchitis, unspecified: Secondary | ICD-10-CM | POA: Diagnosis not present

## 2016-02-20 NOTE — Progress Notes (Signed)
Remote ICD transmission.   

## 2016-02-24 ENCOUNTER — Encounter: Payer: Self-pay | Admitting: Internal Medicine

## 2016-02-24 ENCOUNTER — Ambulatory Visit (INDEPENDENT_AMBULATORY_CARE_PROVIDER_SITE_OTHER): Payer: Medicare Other | Admitting: Internal Medicine

## 2016-02-24 VITALS — BP 120/60 | HR 73 | Temp 97.9°F | Resp 12 | Wt 338.6 lb

## 2016-02-24 DIAGNOSIS — E1122 Type 2 diabetes mellitus with diabetic chronic kidney disease: Secondary | ICD-10-CM | POA: Diagnosis not present

## 2016-02-24 DIAGNOSIS — Z794 Long term (current) use of insulin: Secondary | ICD-10-CM | POA: Diagnosis not present

## 2016-02-24 DIAGNOSIS — N183 Chronic kidney disease, stage 3 unspecified: Secondary | ICD-10-CM

## 2016-02-24 DIAGNOSIS — I255 Ischemic cardiomyopathy: Secondary | ICD-10-CM | POA: Diagnosis not present

## 2016-02-24 LAB — POCT GLYCOSYLATED HEMOGLOBIN (HGB A1C): HEMOGLOBIN A1C: 7.6

## 2016-02-24 NOTE — Progress Notes (Signed)
Patient ID: Robert Gregory, male   DOB: May 25, 1941, 75 y.o.   MRN: [id number]  HPI: Robert Gregory is a 75 y.o.-year-old male, returning for f/u for DM2, dx in 2004, insulin-dependent, uncontrolled, with complications (CAD - Ischemic CMP, s/p CABG x5 in 1997, CHF; CKD stage 3). Last visit 3 mo ago.  He had bronchitis  - 3 rounds of steroids >> still taking them >> will finish tomorrow. Sugars higher during this period.  Last hemoglobin A1c was: Lab Results  Component Value Date   HGBA1C 7.1 11/24/2015   HGBA1C 7.6 08/25/2015   HGBA1C 7.7 05/19/2015   Pt is on a regimen of: - Metformin ER 2000 mg with dinner - Jardiance 25 mg in am - U500 insulin: - 80-90 units before breakfast - 25-30 units before lunch  - 25-30 units before dinner Stopped Cinnamon 500 mg 2x a day He gained 50 lbs with Actos in the past. He was on Invokana 100 mg in am  Pt checks his sugars 3x a day and they are: - am: 150s >> 148-186, 232, 264 >> 150-183, 221 >> 119, 135-257, 281, 300 - 2h after b'fast: n/c >> 141-155 - before lunch: n/c >> 110-120 >> 130-309 - 2h after lunch: n/c >> 109-241 - before dinner: 130s >> 141-236 >> 109,128-160, 271x1 >> 169-299 - 2h after dinner: n/c >> 126-277, 290 >> 126, 151-215 >> 118-230, 303 - bedtime: 150-170 (if <120 - eat a snack) >> 95, 108-230 >> 111-201 >> 140-196 - nighttime: n/c >> 84-226 >> 80x2 >> n/c No lows. Lowest sugar was 84 >> 80 x2 (late evening); he has hypoglycemia awareness at 70.  Highest sugar was 330 (rarely) - after carb-loaded meals >> 300 (chinese food) >> 220x1  Glucometer: [employer] 2  Pt's meals are: - Breakfast: cereals + 2% milk, OJ ; sausage + eggs + toast - Lunch: soup + sandwich (lightest) - Dinner: meat + veggies + starch  - Snacks: 1- pm: peanuts, V8 juice  - + mild CKD, last BUN/creatinine:  Lab Results  Component Value Date   BUN 28* 10/06/2015   CREATININE 1.29* 10/06/2015  On Benicar 80.  - last set of lipids: Lab Results   Component Value Date   CHOL 121* 08/27/2015   HDL 20* 08/27/2015   LDLCALC 31 08/27/2015   TRIG 350* 08/27/2015   CHOLHDL 6.1* 08/27/2015  On Pravachol 80. - last eye exam was in 10/2015. No DR.  - no numbness and tingling in his feet.  ROS: Constitutional: + weight gain, no fatigue, no subjective hyperthermia Eyes: no blurry vision, no xerophthalmia ENT: no sore throat, no nodules palpated in throat, no dysphagia/odynophagia, no hoarseness Cardiovascular: no CP/SOB/no palpitations/leg swelling Respiratory: no cough/SOB Gastrointestinal: no N/V/D/C Musculoskeletal: no muscle/joint aches Skin: no rash Neurological: no tremors/numbness/tingling/dizziness  I reviewed pt's medications, allergies, PMH, social hx, family hx, and changes were documented in the history of present illness. Otherwise, unchanged from my initial visit note.  Past Medical History  Diagnosis Date  . CAD (coronary artery disease)   . Atrial fibrillation ([place])     takes Xarelto daily  . Hyperlipidemia     takes [employer] daily  . Obstructive sleep apnea   . Nephrolithiasis   . Thrombocytopenia ([place])   . Cholelithiasis   . Spinal stenosis   . ICD (implantable cardioverter-defibrillator) battery depletion   . Ischemic cardiomyopathy   . Congestive heart failure ([place])     takes Spironolactone daily  . Hypertension  takes Benicar and Coreg daily  . Myocardial infarction ([place]) 1997    on the OR table   . Complication of anesthesia     forgetful for about 2-3wks after anesthesia  . Pneumonia     as a child  . Peripheral neuropathy ([place])   . Arthritis   . Joint pain   . Chronic back pain     scoliosis/arthritis  . History of colon polyps   . Urinary frequency   . Urinary urgency   . History of kidney stones   . Diabetes mellitus ([place])     takes Invokamet daily as well as Hum U  . Cardiomyopathy, ischemic 11/07/2013  . Hyperlipidemia 11/07/2013  . Ischemic cardiomyopathy   .  ICD (implantable cardioverter-defibrillator) in place 11/07/2013  . Type 2 diabetes mellitus with stage 3 chronic kidney disease ([place]) 05/19/2015  . Total knee replacement status 11/29/2014   Past Surgical History  Procedure Laterality Date  . Coronary artery bypass and graft  1997    x 5  . Right rotator cuff surgery    . Total knee arthroplasty Left   . Laminectomy    . Tonsillectomy      adenoids  . Icd placed    . Colonoscopy    . Esophagogastroduodenoscopy    . Lithotripsy    . Total knee arthroplasty Right 11/29/2014    Procedure: RIGHT TOTAL KNEE ARTHROPLASTY;  Surgeon: Robert Koyanagi, MD;  Location: [place];  Service: Orthopedics;  Laterality: Right;  . Cardiac catheterization N/A 10/02/2015    Procedure: Left Heart Cath and Cors/Grafts Angiography;  Surgeon: Robert Crome, MD;  Location: [facility];  Service: Cardiovascular;  Laterality: N/A;   Social History   Social History  . Marital Status: Married    Spouse Name: N/A  . Number of Children: N/A  . Years of Education: N/A   Occupational History  .      Retired from [employer]  . Smoking status: Never Smoker   . Smokeless tobacco: Not on file  . Alcohol Use: 0.0 oz/week    0 Standard drinks or equivalent per week     Comment: once drink a month  . Drug Use: No  . Sexual Activity: Not on file   Other Topics Concern  . Not on file   Social History Narrative   Current Outpatient Prescriptions on File Prior to Visit  Medication Sig Dispense Refill  . amoxicillin (AMOXIL) 500 MG capsule Take 500 mg by mouth as directed. 6 prior to dental work    . carvedilol (COREG) 12.5 MG tablet Take 1 tablet (12.5 mg total) by mouth 2 (two) times daily with a meal. Needs to make a yearly appointment 180 tablet 3  . Coenzyme Q10 200 MG TABS Take 1 tablet by mouth daily.    . Cranberry 200 MG CAPS Take 200 mg by mouth daily.     . empagliflozin (JARDIANCE) 25 MG TABS tablet Take 25 mg by mouth  daily. 90 tablet 1  . ergocalciferol (VITAMIN D2) 50000 units capsule Take 50,000 Units by mouth once a week.    . ezetimibe (ZETIA) 10 MG tablet Take 1 tablet (10 mg total) by mouth daily. 90 tablet 3  . fenofibrate (TRICOR) 48 MG tablet Take 1 tablet (48 mg total) by mouth daily. 90 tablet 3  . furosemide (LASIX) 40 MG tablet Take 1 tablet (40 mg total) by mouth daily. 180 tablet 3  .  insulin regular human CONCENTRATED (HUMULIN R) 500 UNIT/ML injection Inject 80-90 units in am, and 25-30 units before lunch and dinner 60 mL 1  . Insulin Syringe/Needle U-500 (BD INSULIN SYRINGE U-500) 31G X 6MM 0.5 ML MISC Inject 2 each into the skin 2 (two) times daily with a meal. Use 3x a day 270 each 3  . metFORMIN (GLUCOPHAGE-XR) 500 MG 24 hr tablet Take 2000 mg by mouth with dinner 360 tablet 1  . montelukast (SINGULAIR) 10 MG tablet Take 10 mg by mouth at bedtime.    . Multiple Vitamins-Minerals (CENTRUM SILVER PO) Take 1 tablet by mouth daily.    . nitroGLYCERIN (NITROSTAT) 0.4 MG SL tablet Place 1 tablet (0.4 mg total) under the tongue every 5 (five) minutes as needed for chest pain. 25 tablet 3  . NON FORMULARY Methyfolate 2000 MCG daily    . olmesartan (BENICAR) 40 MG tablet Take 2 tablets (80 mg total) by mouth daily. 180 tablet 3  . omega-3 acid ethyl esters (LOVAZA) 1 G capsule Take 2 capsules (2 g total) by mouth 2 (two) times daily. 360 capsule 3  . potassium chloride (K-DUR,KLOR-CON) 10 MEQ tablet Take 2 tablets (20 mEq total) by mouth 2 (two) times daily. 360 tablet 3  . pravastatin (PRAVACHOL) 80 MG tablet Take 1 tablet (80 mg total) by mouth daily. 90 tablet 3  . Probiotic Product (PROBIOTIC DAILY PO) Take 1 tablet by mouth daily.    . rivaroxaban (XARELTO) 20 MG TABS tablet Take 1 tablet (20 mg total) by mouth daily with supper. 90 tablet 3  . spironolactone (ALDACTONE) 25 MG tablet Take 1 tablet (25 mg total) by mouth daily. 90 tablet 3   No current facility-administered medications on file  prior to visit.   Allergies  Allergen Reactions  . Adhesive [Tape]     Rash   . Iodine     flushing  . Contrast Media [Iodinated Diagnostic Agents] Rash and Other (See Comments)    flushing EXTREME FLUSHING    Family History  Problem Relation Age of Onset  . Heart disease      No family history   PE: BP 120/60 mmHg  Pulse 73  Temp(Src) 97.9 F (36.6 C) (Oral)  Resp 12  Wt 338 lb 9.6 oz (153.588 kg)  SpO2 96% Body mass index is 44.68 kg/(m^2). Wt Readings from Last 3 Encounters:  02/24/16 338 lb 9.6 oz (153.588 kg)  12/25/15 337 lb (152.862 kg)  11/24/15 340 lb (154.223 kg)   Constitutional: obese, in NAD Eyes: PERRLA, EOMI, no exophthalmos ENT: moist mucous membranes, no thyromegaly, no cervical lymphadenopathy Cardiovascular: RRR, No MRG Respiratory: CTA B Gastrointestinal: abdomen soft, NT, ND, BS+ Musculoskeletal: no deformities, strength intact in all 4 Skin: moist, warm, no rashes Neurological: + tremor with outstretched hands, DTR normal in all 4  ASSESSMENT: 1. DM2, insulin-dependent, uncontrolled, with complications - CAD, s/p CABG 5x in 1997 (Dr Roxy Horseman), iCMP >> CHF (Dr Stanford Breed) - CKD stage 3  PLAN:  1. Patient with long-standing, uncontrolled diabetes, on oral antidiabetic regimen + U500 insulin, with worsened control lately due to several doses of steroids for bronchitis. - His sugars are higher after meals, as expected due to steroids - Since he is finishing his third steroid round tomorrow, we'll not change the regimen now, but, to give him a more flexible regimen, I will add a sliding scale. - I suggested to:  Patient Instructions  Pleasecontinue: - Metformin ER 2000 mg with dinner - Jardiance  25 mg in am - U500 insulin: - 80-90 units before breakfast - 25-30 units before lunch  - 25-30 units before dinner Please add the following sliding scale: 150-175: + 2 units 176-200: + 3 units 201-225: + 4 units 226-300: + 5 units 301-325: + 6  units >325: + 7 units  Please return in 1.5 months with your sugar log.  - continue checking sugars at different times of the day - check 3 times a day, rotating checks - advised for yearly eye exams >> he is UTD - checked HbA1c today >> 7.6 % (higher) - Return to clinic in 1.5 mo with sugar log

## 2016-02-24 NOTE — Patient Instructions (Addendum)
Pleasecontinue: - Metformin ER 2000 mg with dinner - Jardiance 25 mg in am - U500 insulin: - 80-90 units before breakfast - 25-30 units before lunch  - 25-30 units before dinner Please add the following sliding scale: 150-175: + 2 units 176-200: + 3 units 201-225: + 4 units 226-300: + 5 units 301-325: + 6 units >325: + 7 units  Please return in 1.5 months with your sugar log.

## 2016-03-04 DIAGNOSIS — G4733 Obstructive sleep apnea (adult) (pediatric): Secondary | ICD-10-CM | POA: Diagnosis not present

## 2016-03-05 LAB — CUP PACEART REMOTE DEVICE CHECK
Battery Remaining Longevity: 76 mo
Battery Remaining Percentage: 71 %
HIGH POWER IMPEDANCE MEASURED VALUE: 81 Ohm
HighPow Impedance: 81 Ohm
Implantable Lead Implant Date: 20140320
Implantable Lead Location: 753859
Implantable Lead Location: 753860
Lead Channel Impedance Value: 390 Ohm
Lead Channel Impedance Value: 430 Ohm
Lead Channel Sensing Intrinsic Amplitude: 12 mV
Lead Channel Sensing Intrinsic Amplitude: 3.1 mV
MDC IDC LEAD IMPLANT DT: 20140320
MDC IDC MSMT BATTERY VOLTAGE: 2.96 V
MDC IDC MSMT LEADCHNL RV PACING THRESHOLD AMPLITUDE: 0.75 V
MDC IDC MSMT LEADCHNL RV PACING THRESHOLD PULSEWIDTH: 0.5 ms
MDC IDC SESS DTM: 20170601134505
MDC IDC SET LEADCHNL RV PACING AMPLITUDE: 2 V
MDC IDC SET LEADCHNL RV PACING PULSEWIDTH: 0.5 ms
MDC IDC SET LEADCHNL RV SENSING SENSITIVITY: 0.5 mV
MDC IDC STAT BRADY RV PERCENT PACED: 1 %
Pulse Gen Serial Number: 7067730

## 2016-03-10 ENCOUNTER — Encounter: Payer: Self-pay | Admitting: Cardiology

## 2016-03-17 NOTE — Progress Notes (Signed)
HPI: FU coronary artery disease. Patient is status post coronary artery bypassing graft in 1997. Patient had a LIMA to the LAD, saphenous vein graft to the diagonal, saphenous vein graft to the circumflex and sequential saphenous vein graft to the PDA and posterior lateral. His procedure was performed at [facility]. He subsequently moved to [facility]. Echocardiogram in 2005 showed an ejection fraction of 38%, restrictive filling and trace tricuspid regurgitation. Patient had ICD placed previously. Also with h/o atrial fibrillation. Abdominal ultrasound August 2016 showed no aneurysm. Nuclear study 12/16 showed EF 50; prior inferior MI with mild peri-infarct ischemia and moderate ischemia in the anterolateral wall. Cardiac catheterization January 2017 showed severe three-vessel coronary disease. There was total occlusion of the saphenous vein graft to the obtuse marginal, total occlusion of the saphenous vein graft to diagonal and there was a 50-70% stenosis in the sequential vein graft to the PDA and left ventricular branch and the LIMA to the LAD was patent. Ejection fraction 35-45%. Medical therapy recommended. Since last seen, the patient has dyspnea with more extreme activities but not with routine activities. It is relieved with rest. It is not associated with chest pain. There is no orthopnea, PND. There is no syncope or palpitations. There is no exertional chest pain.   Current Outpatient Prescriptions  Medication Sig Dispense Refill  . amoxicillin (AMOXIL) 500 MG capsule Take 500 mg by mouth as directed. 6 prior to dental work    . carvedilol (COREG) 12.5 MG tablet Take 1 tablet (12.5 mg total) by mouth 2 (two) times daily with a meal. Needs to make a yearly appointment 180 tablet 3  . Coenzyme Q10 200 MG TABS Take 1 tablet by mouth daily.    . Cranberry 200 MG CAPS Take 200 mg by mouth daily.     . empagliflozin (JARDIANCE) 25 MG TABS tablet Take 25 mg by mouth daily. 90 tablet 1    . ergocalciferol (VITAMIN D2) 50000 units capsule Take 50,000 Units by mouth once a week.    . ezetimibe (ZETIA) 10 MG tablet Take 1 tablet (10 mg total) by mouth daily. 90 tablet 3  . fenofibrate (TRICOR) 48 MG tablet Take 1 tablet (48 mg total) by mouth daily. 90 tablet 3  . furosemide (LASIX) 40 MG tablet Take 1 tablet (40 mg total) by mouth daily. 180 tablet 3  . insulin regular human CONCENTRATED (HUMULIN R) 500 UNIT/ML injection Inject 80-90 units in am, and 25-30 units before lunch and dinner 60 mL 1  . Insulin Syringe/Needle U-500 (BD INSULIN SYRINGE U-500) 31G X 6MM 0.5 ML MISC Inject 2 each into the skin 2 (two) times daily with a meal. Use 3x a day 270 each 3  . metFORMIN (GLUCOPHAGE-XR) 500 MG 24 hr tablet Take 2000 mg by mouth with dinner 360 tablet 1  . montelukast (SINGULAIR) 10 MG tablet Take 10 mg by mouth at bedtime.    . Multiple Vitamins-Minerals (CENTRUM SILVER PO) Take 1 tablet by mouth daily.    . nitroGLYCERIN (NITROSTAT) 0.4 MG SL tablet Place 1 tablet (0.4 mg total) under the tongue every 5 (five) minutes as needed for chest pain. 25 tablet 3  . NON FORMULARY Methyfolate 2000 MCG daily    . olmesartan (BENICAR) 40 MG tablet Take 2 tablets (80 mg total) by mouth daily. 180 tablet 3  . omega-3 acid ethyl esters (LOVAZA) 1 G capsule Take 2 capsules (2 g total) by mouth 2 (two) times daily. [place]  capsule 3  . potassium chloride (K-DUR,KLOR-CON) 10 MEQ tablet Take 2 tablets (20 mEq total) by mouth 2 (two) times daily. 360 tablet 3  . pravastatin (PRAVACHOL) 80 MG tablet Take 1 tablet (80 mg total) by mouth daily. 90 tablet 3  . Probiotic Product (PROBIOTIC DAILY PO) Take 1 tablet by mouth daily.    . rivaroxaban (XARELTO) 20 MG TABS tablet Take 1 tablet (20 mg total) by mouth daily with supper. 90 tablet 3  . spironolactone (ALDACTONE) 25 MG tablet Take 1 tablet (25 mg total) by mouth daily. 90 tablet 3   No current facility-administered medications for this visit.      Past Medical History  Diagnosis Date  . CAD (coronary artery disease)   . Atrial fibrillation ([place])     takes Xarelto daily  . Hyperlipidemia     takes Sanmina-SCI daily  . Obstructive sleep apnea   . Nephrolithiasis   . Thrombocytopenia ([place])   . Cholelithiasis   . Spinal stenosis   . ICD (implantable cardioverter-defibrillator) battery depletion   . Ischemic cardiomyopathy   . Congestive heart failure ([place])     takes Spironolactone daily  . Hypertension     takes Benicar and Coreg daily  . Myocardial infarction ([place]) 1997    on the OR table   . Complication of anesthesia     forgetful for about 2-3wks after anesthesia  . Pneumonia     as a child  . Peripheral neuropathy ([place])   . Arthritis   . Joint pain   . Chronic back pain     scoliosis/arthritis  . History of colon polyps   . Urinary frequency   . Urinary urgency   . History of kidney stones   . Diabetes mellitus ([place])     takes Invokamet daily as well as Hum U  . Cardiomyopathy, ischemic 11/07/2013  . Hyperlipidemia 11/07/2013  . Ischemic cardiomyopathy   . ICD (implantable cardioverter-defibrillator) in place 11/07/2013  . Type 2 diabetes mellitus with stage 3 chronic kidney disease ([place]) 05/19/2015  . Total knee replacement status 11/29/2014    Past Surgical History  Procedure Laterality Date  . Coronary artery bypass and graft  1997    x 5  . Right rotator cuff surgery    . Total knee arthroplasty Left   . Laminectomy    . Tonsillectomy      adenoids  . Icd placed    . Colonoscopy    . Esophagogastroduodenoscopy    . Lithotripsy    . Total knee arthroplasty Right 11/29/2014    Procedure: RIGHT TOTAL KNEE ARTHROPLASTY;  Surgeon: Leandrew Koyanagi, MD;  Location: [facility];  Service: Orthopedics;  Laterality: Right;  . Cardiac catheterization N/A 10/02/2015    Procedure: Left Heart Cath and Cors/Grafts Angiography;  Surgeon: Belva Crome, MD;  Location: [facility];  Service:  Cardiovascular;  Laterality: N/A;    Social History   Social History  . Marital Status: Married    Spouse Name: N/A  . Number of Children: N/A  . Years of Education: N/A   Occupational History  .      Retired from [employer]  . Smoking status: Never Smoker   . Smokeless tobacco: Not on file  . Alcohol Use: 0.0 oz/week    0 Standard drinks or equivalent per week     Comment: once drink a month  . Drug Use: No  . Sexual  Activity: Not on file   Other Topics Concern  . Not on file   Social History Narrative    Family History  Problem Relation Age of Onset  . Heart disease      No family history    ROS: no fevers or chills, productive cough, hemoptysis, dysphasia, odynophagia, melena, hematochezia, dysuria, hematuria, rash, seizure activity, orthopnea, PND, pedal edema, claudication. Remaining systems are negative.  Physical Exam: Well-developed obese in no acute distress.  Skin is warm and dry.  HEENT is normal.  Neck is supple.  Chest is clear to auscultation with normal expansion.  Cardiovascular exam is regular rate and rhythm.  Abdominal exam nontender or distended. No masses palpated. Extremities show 1+ edema. neuro grossly intact  ECG  Assessment and plan  1 atrial fibrillation-continue beta blocker and xarelto. Check Hgb and renal function. 2 Coronary artery disease-continue statin. No aspirin given need for anticoagulation. 3 ischemic cardiomyopathy-continue ARB and beta blocker. 4 hypertension-blood pressure is controlled. Continue present medications. 5 hyperlipidemia-continue statin. He has not tolerated high-dose statin previously. 6 ICD-follow-up electrophysiology.  Kirk Ruths, MD

## 2016-03-19 ENCOUNTER — Ambulatory Visit (INDEPENDENT_AMBULATORY_CARE_PROVIDER_SITE_OTHER): Payer: Medicare Other | Admitting: Podiatry

## 2016-03-19 ENCOUNTER — Ambulatory Visit (INDEPENDENT_AMBULATORY_CARE_PROVIDER_SITE_OTHER): Payer: Medicare Other

## 2016-03-19 ENCOUNTER — Encounter: Payer: Self-pay | Admitting: Podiatry

## 2016-03-19 DIAGNOSIS — M779 Enthesopathy, unspecified: Secondary | ICD-10-CM

## 2016-03-19 DIAGNOSIS — I255 Ischemic cardiomyopathy: Secondary | ICD-10-CM | POA: Diagnosis not present

## 2016-03-19 DIAGNOSIS — R52 Pain, unspecified: Secondary | ICD-10-CM | POA: Diagnosis not present

## 2016-03-19 NOTE — Progress Notes (Signed)
   Subjective:    Patient ID: Robert Gregory, male    DOB: 1940-12-01, 75 y.o.   MRN: [id number]  HPI this patient presents to the office with chief complaint of having forefoot pain on both feet. He states he's had pain in the ball of his left foot for 2 months. He states he believes he has developed a painful callus, which she has been unable to view. He denies any history of trauma or injury to the foot. Patient does admit that he is diabetic for 12-13 years. He has been diagnosed with diabetes with peripheral neuropathy. He presents the office today for an evaluation and treatment of his feet    Review of Systems  Constitutional: Positive for fatigue.  HENT: Positive for hearing loss.   Cardiovascular: Positive for leg swelling.  Hematological:       Rashes  All other systems reviewed and are negative.      Objective:   Physical Exam GENERAL APPEARANCE: Alert, conversant. Appropriately groomed. No acute distress.  VASCULAR: Pedal pulses are not palpable at  [facility] and PT bilateral due to his severe swelling.  Capillary refill time is immediate to all digits,  Normal temperature gradient.   NEUROLOGIC: sensation is normal to 5.07 monofilament at 5/5 sites bilateral.  Light touch is intact bilateral, Muscle strength normal.  MUSCULOSKELETAL: acceptable muscle strength, tone and stability bilateral.  Intrinsic muscluature intact bilateral.  Rectus appearance of foot and digits noted bilateral. Palpable pain sub 2 left foot secondary hammer toe.  DERMATOLOGIC: skin color, texture, and turgor are within normal limits.  No preulcerative lesions or ulcers  are seen, no interdigital maceration noted.  No open lesions present.  Digital nails are asymptomatic. No drainage noted.         Assessment & Plan:  Capsulitis sub 2 left foot.  IE  Diabetic insole was dispensed to help cushion his forefeet B/L.  RTC 4 weeks   Gardiner Barefoot DPM

## 2016-03-24 ENCOUNTER — Encounter: Payer: Self-pay | Admitting: Cardiology

## 2016-03-24 ENCOUNTER — Ambulatory Visit (INDEPENDENT_AMBULATORY_CARE_PROVIDER_SITE_OTHER): Payer: Medicare Other | Admitting: Cardiology

## 2016-03-24 VITALS — BP 112/66 | HR 85 | Ht 73.0 in | Wt 337.8 lb

## 2016-03-24 DIAGNOSIS — I480 Paroxysmal atrial fibrillation: Secondary | ICD-10-CM

## 2016-03-24 DIAGNOSIS — E785 Hyperlipidemia, unspecified: Secondary | ICD-10-CM

## 2016-03-24 DIAGNOSIS — I255 Ischemic cardiomyopathy: Secondary | ICD-10-CM

## 2016-03-24 DIAGNOSIS — Z95810 Presence of automatic (implantable) cardiac defibrillator: Secondary | ICD-10-CM

## 2016-03-24 DIAGNOSIS — I2510 Atherosclerotic heart disease of native coronary artery without angina pectoris: Secondary | ICD-10-CM

## 2016-03-24 DIAGNOSIS — N289 Disorder of kidney and ureter, unspecified: Secondary | ICD-10-CM

## 2016-03-24 DIAGNOSIS — I2583 Coronary atherosclerosis due to lipid rich plaque: Principal | ICD-10-CM

## 2016-03-24 DIAGNOSIS — I10 Essential (primary) hypertension: Secondary | ICD-10-CM

## 2016-03-24 MED ORDER — SPIRONOLACTONE 25 MG PO TABS: 25.0000 mg | ORAL_TABLET | Freq: Every day | ORAL | Status: DC

## 2016-03-24 MED ORDER — FUROSEMIDE 40 MG PO TABS: 40.0000 mg | ORAL_TABLET | Freq: Every day | ORAL | Status: DC

## 2016-03-24 MED ORDER — FENOFIBRATE 48 MG PO TABS: 48.0000 mg | ORAL_TABLET | Freq: Every day | ORAL | Status: DC

## 2016-03-24 MED ORDER — CARVEDILOL 12.5 MG PO TABS: 12.5000 mg | ORAL_TABLET | Freq: Two times a day (BID) | ORAL | Status: DC

## 2016-03-24 MED ORDER — EZETIMIBE 10 MG PO TABS: 10.0000 mg | ORAL_TABLET | Freq: Every day | ORAL | Status: DC

## 2016-03-24 MED ORDER — PRAVASTATIN SODIUM 80 MG PO TABS
80.0000 mg | ORAL_TABLET | Freq: Every day | ORAL | Status: DC
Start: 2016-03-24 — End: 2017-03-16

## 2016-03-24 MED ORDER — RIVAROXABAN 20 MG PO TABS: 20.0000 mg | ORAL_TABLET | Freq: Every day | ORAL | Status: DC

## 2016-03-24 MED ORDER — OLMESARTAN MEDOXOMIL 40 MG PO TABS: 40.0000 mg | ORAL_TABLET | Freq: Two times a day (BID) | ORAL | Status: DC

## 2016-03-24 MED ORDER — OMEGA-3-ACID ETHYL ESTERS 1 G PO CAPS: 2.0000 g | ORAL_CAPSULE | Freq: Two times a day (BID) | ORAL | Status: DC

## 2016-03-24 NOTE — Patient Instructions (Signed)
Medication Instructions:   NO CHANGE  Labwork:  Your physician recommends that you return for lab work WITH DR POLITE  Follow-Up:  Your physician wants you to follow-up in: [place] will receive a reminder letter in the mail two months in advance. If you don't receive a letter, please call our office to schedule the follow-up appointment.

## 2016-04-05 DIAGNOSIS — E1165 Type 2 diabetes mellitus with hyperglycemia: Secondary | ICD-10-CM | POA: Diagnosis not present

## 2016-04-05 DIAGNOSIS — E781 Pure hyperglyceridemia: Secondary | ICD-10-CM | POA: Diagnosis not present

## 2016-04-05 DIAGNOSIS — E559 Vitamin D deficiency, unspecified: Secondary | ICD-10-CM | POA: Diagnosis not present

## 2016-04-05 DIAGNOSIS — I10 Essential (primary) hypertension: Secondary | ICD-10-CM | POA: Diagnosis not present

## 2016-04-05 DIAGNOSIS — H40033 Anatomical narrow angle, bilateral: Secondary | ICD-10-CM | POA: Diagnosis not present

## 2016-04-05 DIAGNOSIS — I2510 Atherosclerotic heart disease of native coronary artery without angina pectoris: Secondary | ICD-10-CM | POA: Diagnosis not present

## 2016-04-05 DIAGNOSIS — I5022 Chronic systolic (congestive) heart failure: Secondary | ICD-10-CM | POA: Diagnosis not present

## 2016-04-05 DIAGNOSIS — Z7984 Long term (current) use of oral hypoglycemic drugs: Secondary | ICD-10-CM | POA: Diagnosis not present

## 2016-04-05 DIAGNOSIS — H25813 Combined forms of age-related cataract, bilateral: Secondary | ICD-10-CM | POA: Diagnosis not present

## 2016-04-05 DIAGNOSIS — G4733 Obstructive sleep apnea (adult) (pediatric): Secondary | ICD-10-CM | POA: Diagnosis not present

## 2016-04-05 DIAGNOSIS — H40003 Preglaucoma, unspecified, bilateral: Secondary | ICD-10-CM | POA: Diagnosis not present

## 2016-04-05 DIAGNOSIS — E119 Type 2 diabetes mellitus without complications: Secondary | ICD-10-CM | POA: Diagnosis not present

## 2016-04-16 ENCOUNTER — Ambulatory Visit: Payer: Medicare Other | Admitting: Podiatry

## 2016-04-20 ENCOUNTER — Encounter: Payer: Self-pay | Admitting: Internal Medicine

## 2016-04-20 ENCOUNTER — Ambulatory Visit (INDEPENDENT_AMBULATORY_CARE_PROVIDER_SITE_OTHER): Payer: Medicare Other | Admitting: Internal Medicine

## 2016-04-20 VITALS — BP 142/80 | HR 82 | Ht 72.0 in | Wt 345.0 lb

## 2016-04-20 DIAGNOSIS — Z794 Long term (current) use of insulin: Secondary | ICD-10-CM

## 2016-04-20 DIAGNOSIS — N183 Chronic kidney disease, stage 3 unspecified: Secondary | ICD-10-CM

## 2016-04-20 DIAGNOSIS — E1122 Type 2 diabetes mellitus with diabetic chronic kidney disease: Secondary | ICD-10-CM | POA: Diagnosis not present

## 2016-04-20 DIAGNOSIS — R6889 Other general symptoms and signs: Secondary | ICD-10-CM

## 2016-04-20 DIAGNOSIS — I255 Ischemic cardiomyopathy: Secondary | ICD-10-CM

## 2016-04-20 MED ORDER — INSULIN REGULAR HUMAN (CONC) 500 UNIT/ML ~~LOC~~ SOLN: SUBCUTANEOUS | 1 refills | Status: DC

## 2016-04-20 MED ORDER — EMPAGLIFLOZIN 25 MG PO TABS: 25.0000 mg | ORAL_TABLET | Freq: Every day | ORAL | 1 refills | Status: DC

## 2016-04-20 NOTE — Patient Instructions (Addendum)
Pleasecontinue: - Metformin ER 2000 mg with dinner - Jardiance 25 mg in am - U500 insulin: - 80-90 units before breakfast - 25-30 units before lunch  - 25-30 units before dinner Please continue the U500 sliding scale: 150-175: + 2 units 176-200: + 3 units 201-225: + 4 units 226-300: + 5 units 301-325: + 6 units >325: + 7 units  Please work on reducing the portions.  Please stop at the lab.  Please return in 1.5 months with your sugar log.

## 2016-04-20 NOTE — Progress Notes (Signed)
Patient ID: Robert Gregory, male   DOB: 1940-10-03, 75 y.o.   MRN: [id number]  HPI: Robert Gregory is a 75 y.o.-year-old male, returning for f/u for DM2, dx in 2004, insulin-dependent, uncontrolled, with complications (CAD - Ischemic CMP, s/p CABG x5 in 1997, CHF; CKD stage 3). Last visit 2 mo ago.  Last hemoglobin A1c was: Lab Results  Component Value Date   HGBA1C 7.6 02/24/2016   HGBA1C 7.1 11/24/2015   HGBA1C 7.6 08/25/2015   Pt is on a regimen of: - Metformin ER 2000 mg with dinner - Jardiance 25 mg in am - U500 insulin: - 80-90 units before breakfast - 25-30 units before lunch  - 25-30 units before dinner  U500 sliding scale (added 02/2016): 150-175: + 2 units  176-200: + 3 units 201-225: + 4 units 226-300: + 5 units 301-325: + 6 units >325: + 7 units Stopped Cinnamon 500 mg 2x a day He gained 50 lbs with Actos in the past. He was on Invokana 100 mg in am  Pt checks his sugars 3x a day and they are: - am: 150s >> 148-186, 232, 264 >> 150-183, 221 >> 119, 135-257, 281, 300 >> 157-201 - 2h after b'fast: n/c >> 141-155 >> 191 - before lunch: n/c >> 110-120 >> 130-309 >> 191-273 - 2h after lunch: n/c >> 109-241 >> 308, 321  (mex meal) - before dinner: 130s >> 141-236 >> 109,128-160, 271x1 >> 169-299 >> 106, 130-191, 283 - 2h after dinner: n/c >> 126-277, 290 >> 126, 151-215 >> 118-230, 303 >> 136-245, 302 (mex meal) - bedtime: 150-170 (if <120 - eat a snack) >> 95, 108-230 >> 111-201 >> 140-196 >> 130-165, 310 - nighttime: n/c >> 84-226 >> 80x2 >> n/c >> 144 No lows. Lowest sugar was 84 >> 80 x2 (late evening) >> 106; he has hypoglycemia awareness at 70.  Highest sugar was 330 (rarely) - after carb-loaded meals >> 300 (chinese food) >> 220x1  Glucometer: [employer] 2  Pt's meals are: - Breakfast: cereals + 2% milk, OJ ; sausage + eggs + toast - Lunch: soup + sandwich (lightest) - Dinner: meat + veggies + starch  - Snacks: 1- pm: peanuts, V8 juice  - + mild CKD, last  BUN/creatinine:  Lab Results  Component Value Date   BUN 28 (H) 10/06/2015   CREATININE 1.29 (H) 10/06/2015  On Benicar 80.  - last set of lipids: Lab Results  Component Value Date   CHOL 121 (L) 08/27/2015   HDL 20 (L) 08/27/2015   LDLCALC 31 08/27/2015   TRIG 350 (H) 08/27/2015   CHOLHDL 6.1 (H) 08/27/2015  On Pravachol 80. - last eye exam was in 10/2015. No DR.  - no numbness and tingling in his feet.  ROS: Constitutional: + weight gain, no fatigue, no subjective hyperthermia Eyes: no blurry vision, no xerophthalmia ENT: no sore throat, no nodules palpated in throat, no dysphagia/odynophagia, no hoarseness Cardiovascular: no CP/SOB/no palpitations/leg swelling Respiratory: no cough/SOB Gastrointestinal: no N/V/D/C Musculoskeletal: no muscle/joint aches Skin: no rash Neurological: no tremors/numbness/tingling/dizziness  I reviewed pt's medications, allergies, PMH, social hx, family hx, and changes were documented in the history of present illness. Otherwise, unchanged from my initial visit note.  Past Medical History:  Diagnosis Date  . Arthritis   . Atrial fibrillation ([place])    takes Xarelto daily  . CAD (coronary artery disease)   . Cardiomyopathy, ischemic 11/07/2013  . Cholelithiasis   . Chronic back pain    scoliosis/arthritis  .  Complication of anesthesia    forgetful for about 2-3wks after anesthesia  . Congestive heart failure ([place])    takes Spironolactone daily  . Diabetes mellitus ([place])    takes Invokamet daily as well as Hum U  . History of colon polyps   . History of kidney stones   . Hyperlipidemia    takes Sanmina-SCI daily  . Hyperlipidemia 11/07/2013  . Hypertension    takes Benicar and Coreg daily  . ICD (implantable cardioverter-defibrillator) battery depletion   . ICD (implantable cardioverter-defibrillator) in place 11/07/2013  . Ischemic cardiomyopathy   . Ischemic cardiomyopathy   . Joint pain   . Myocardial infarction  ([place]) 1997   on the OR table   . Nephrolithiasis   . Obstructive sleep apnea   . Peripheral neuropathy ([place])   . Pneumonia    as a child  . Spinal stenosis   . Thrombocytopenia ([place])   . Total knee replacement status 11/29/2014  . Type 2 diabetes mellitus with stage 3 chronic kidney disease ([place]) 05/19/2015  . Urinary frequency   . Urinary urgency    Past Surgical History:  Procedure Laterality Date  . CARDIAC CATHETERIZATION N/A 10/02/2015   Procedure: Left Heart Cath and Cors/Grafts Angiography;  Surgeon: Belva Crome, MD;  Location: [facility];  Service: Cardiovascular;  Laterality: N/A;  . COLONOSCOPY    . coronary artery bypass and graft  1997   x 5  . ESOPHAGOGASTRODUODENOSCOPY    . ICD placed    . LAMINECTOMY    . LITHOTRIPSY    . Right rotator cuff surgery    . TONSILLECTOMY     adenoids  . TOTAL KNEE ARTHROPLASTY Left   . TOTAL KNEE ARTHROPLASTY Right 11/29/2014   Procedure: RIGHT TOTAL KNEE ARTHROPLASTY;  Surgeon: Leandrew Koyanagi, MD;  Location: [facility];  Service: Orthopedics;  Laterality: Right;   Social History   Social History  . Marital status: Married    Spouse name: N/A  . Number of children: N/A  . Years of education: N/A   Occupational History  .      Retired from [employer]  . Smoking status: Never Smoker  . Smokeless tobacco: Not on file  . Alcohol use 0.0 oz/week     Comment: once drink a month  . Drug use: No  . Sexual activity: Not on file   Other Topics Concern  . Not on file   Social History Narrative  . No narrative on file   Current Outpatient Prescriptions on File Prior to Visit  Medication Sig Dispense Refill  . amoxicillin (AMOXIL) 500 MG capsule Take 500 mg by mouth as directed. 6 prior to dental work    . carvedilol (COREG) 12.5 MG tablet Take 1 tablet (12.5 mg total) by mouth 2 (two) times daily with a meal. 180 tablet 3  . Coenzyme Q10 200 MG TABS Take 1 tablet by mouth daily.    . Cranberry  200 MG CAPS Take 200 mg by mouth daily.     . empagliflozin (JARDIANCE) 25 MG TABS tablet Take 25 mg by mouth daily. 90 tablet 1  . ergocalciferol (VITAMIN D2) 50000 units capsule Take 50,000 Units by mouth once a week.    . ezetimibe (ZETIA) 10 MG tablet Take 1 tablet (10 mg total) by mouth daily. 90 tablet 3  . fenofibrate (TRICOR) 48 MG tablet Take 1 tablet (48 mg total) by mouth daily. [place]  tablet 3  . furosemide (LASIX) 40 MG tablet Take 1 tablet (40 mg total) by mouth daily. 90 tablet 3  . insulin regular human CONCENTRATED (HUMULIN R) 500 UNIT/ML injection Inject 80-90 units in am, and 25-30 units before lunch and dinner 60 mL 1  . Insulin Syringe/Needle U-500 (BD INSULIN SYRINGE U-500) 31G X 6MM 0.5 ML MISC Inject 2 each into the skin 2 (two) times daily with a meal. Use 3x a day 270 each 3  . metFORMIN (GLUCOPHAGE-XR) 500 MG 24 hr tablet Take 2000 mg by mouth with dinner 360 tablet 1  . montelukast (SINGULAIR) 10 MG tablet Take 10 mg by mouth at bedtime.    . Multiple Vitamins-Minerals (CENTRUM SILVER PO) Take 1 tablet by mouth daily.    . nitroGLYCERIN (NITROSTAT) 0.4 MG SL tablet Place 1 tablet (0.4 mg total) under the tongue every 5 (five) minutes as needed for chest pain. 25 tablet 3  . NON FORMULARY Methyfolate 2000 MCG daily    . olmesartan (BENICAR) 40 MG tablet Take 1 tablet (40 mg total) by mouth 2 (two) times daily. 180 tablet 3  . omega-3 acid ethyl esters (LOVAZA) 1 g capsule Take 2 capsules (2 g total) by mouth 2 (two) times daily. 360 capsule 3  . potassium chloride (K-DUR,KLOR-CON) 10 MEQ tablet Take 2 tablets (20 mEq total) by mouth 2 (two) times daily. 360 tablet 3  . pravastatin (PRAVACHOL) 80 MG tablet Take 1 tablet (80 mg total) by mouth daily. 90 tablet 3  . Probiotic Product (PROBIOTIC DAILY PO) Take 1 tablet by mouth daily.    . rivaroxaban (XARELTO) 20 MG TABS tablet Take 1 tablet (20 mg total) by mouth daily with supper. 90 tablet 3  . spironolactone (ALDACTONE) 25  MG tablet Take 1 tablet (25 mg total) by mouth daily. 90 tablet 3   No current facility-administered medications on file prior to visit.    Allergies  Allergen Reactions  . Adhesive [Tape]     Rash   . Iodine     flushing  . Seasonal Ic [Cholestatin]   . Contrast Media [Iodinated Diagnostic Agents] Rash and Other (See Comments)    flushing EXTREME FLUSHING    Family History  Problem Relation Age of Onset  . Heart disease      No family history   PE: BP (!) 142/80 (BP Location: Left Arm, Patient Position: Sitting)   Pulse 82   Ht 6' (1.829 m)   Wt (!) 345 lb (156.5 kg)   SpO2 97%   BMI 46.79 kg/m  Body mass index is 46.79 kg/m. Wt Readings from Last 3 Encounters:  04/20/16 (!) 345 lb (156.5 kg)  03/24/16 (!) 337 lb 12.8 oz (153.2 kg)  02/24/16 (!) 338 lb 9.6 oz (153.6 kg)   Constitutional: obese, in NAD Eyes: PERRLA, EOMI, no exophthalmos ENT: moist mucous membranes, no thyromegaly, no cervical lymphadenopathy Cardiovascular: RRR, No MRG Respiratory: CTA B Gastrointestinal: abdomen soft, NT, ND, BS+ Musculoskeletal: no deformities, strength intact in all 4 Skin: moist, warm, no rashes Neurological: + tremor with outstretched hands, DTR normal in all 4  ASSESSMENT: 1. DM2, insulin-dependent, uncontrolled, with complications - CAD, s/p CABG 5x in 1997 (Dr Roxy Horseman), iCMP >> CHF (Dr Stanford Breed) - CKD stage 3  PLAN:  1. Patient with long-standing, uncontrolled diabetes, on oral antidiabetic regimen + U500 insulin, with worsened control at last visit (HbA1c 7.6%) due to several doses of steroids for bronchitis. We added a sliding scale of U500  then. Sugars are still high and he gained weight >> advised to cut down portions so that his insulin resistance decreases >> he would like to try this before a change in doses. If no signif. improvement at next visit >> will increase his mealtime insulin. - reviewed last hbA1c >> 7.6% (higher) - I suggested to:  Patient Instructions   Pleasecontinue: - Metformin ER 2000 mg with dinner - Jardiance 25 mg in am - U500 insulin: - 80-90 units before breakfast - 25-30 units before lunch  - 25-30 units before dinner Please continue the U500 sliding scale: 150-175: + 2 units 176-200: + 3 units 201-225: + 4 units 226-300: + 5 units 301-325: + 6 units >325: + 7 units  Please return in 1.5 months with your sugar log.  - continue checking sugars at different times of the day - check 3 times a day, rotating checks - advised for yearly eye exams >> he is UTD - Return to clinic in 1.5 mo with sugar log   2. Heat intolerance - no weight loss, palpitations, tremors - he would want me to check TFTs >> will order  Office Visit on 04/20/2016  Component Date Value Ref Range Status  . Hemoglobin A1C 02/24/2016 7.6   Final  . TSH 04/21/2016 1.82  0.35 - 4.50 uIU/mL Final  . Free T4 04/21/2016 0.84  0.60 - 1.60 ng/dL Final  . T3, Free 04/21/2016 3.4  2.3 - 4.2 pg/mL Final   The thyroid labs are normal.  Philemon Kingdom, MD PhD [facility] Endocrinology

## 2016-04-21 ENCOUNTER — Encounter (HOSPITAL_BASED_OUTPATIENT_CLINIC_OR_DEPARTMENT_OTHER): Payer: Self-pay

## 2016-04-21 ENCOUNTER — Other Ambulatory Visit (HOSPITAL_BASED_OUTPATIENT_CLINIC_OR_DEPARTMENT_OTHER): Payer: Medicare Other

## 2016-04-21 ENCOUNTER — Ambulatory Visit (HOSPITAL_BASED_OUTPATIENT_CLINIC_OR_DEPARTMENT_OTHER)
Admission: RE | Admit: 2016-04-21 | Discharge: 2016-04-21 | Disposition: A | Discharge status: Home / Self Care | Payer: Medicare Other | Source: Ambulatory Visit | Attending: Hematology & Oncology | Admitting: Hematology & Oncology

## 2016-04-21 ENCOUNTER — Ambulatory Visit (HOSPITAL_BASED_OUTPATIENT_CLINIC_OR_DEPARTMENT_OTHER): Payer: Medicare Other | Admitting: Hematology & Oncology

## 2016-04-21 ENCOUNTER — Encounter: Payer: Self-pay | Admitting: Hematology & Oncology

## 2016-04-21 VITALS — BP 153/74 | HR 84 | Temp 97.7°F | Resp 18 | Ht 72.0 in | Wt 340.0 lb

## 2016-04-21 DIAGNOSIS — K760 Fatty (change of) liver, not elsewhere classified: Secondary | ICD-10-CM | POA: Insufficient documentation

## 2016-04-21 DIAGNOSIS — N289 Disorder of kidney and ureter, unspecified: Secondary | ICD-10-CM | POA: Diagnosis not present

## 2016-04-21 DIAGNOSIS — D6959 Other secondary thrombocytopenia: Secondary | ICD-10-CM | POA: Diagnosis not present

## 2016-04-21 DIAGNOSIS — D696 Thrombocytopenia, unspecified: Secondary | ICD-10-CM

## 2016-04-21 DIAGNOSIS — I4891 Unspecified atrial fibrillation: Secondary | ICD-10-CM | POA: Diagnosis not present

## 2016-04-21 DIAGNOSIS — D732 Chronic congestive splenomegaly: Secondary | ICD-10-CM

## 2016-04-21 DIAGNOSIS — E119 Type 2 diabetes mellitus without complications: Secondary | ICD-10-CM

## 2016-04-21 DIAGNOSIS — K8020 Calculus of gallbladder without cholecystitis without obstruction: Secondary | ICD-10-CM | POA: Insufficient documentation

## 2016-04-21 DIAGNOSIS — R161 Splenomegaly, not elsewhere classified: Secondary | ICD-10-CM | POA: Diagnosis not present

## 2016-04-21 DIAGNOSIS — D731 Hypersplenism: Secondary | ICD-10-CM

## 2016-04-21 LAB — CBC WITH DIFFERENTIAL (CANCER CENTER ONLY)
BASO#: 0 10*3/uL (ref 0.0–0.2)
BASO%: 0.4 % (ref 0.0–2.0)
EOS ABS: 0.3 10*3/uL (ref 0.0–0.5)
EOS%: 5 % (ref 0.0–7.0)
HCT: 42.7 % (ref 38.7–49.9)
HGB: 14.3 g/dL (ref 13.0–17.1)
LYMPH#: 0.9 10*3/uL (ref 0.9–3.3)
LYMPH%: 18.8 % (ref 14.0–48.0)
MCH: 32.2 pg (ref 28.0–33.4)
MCHC: 33.5 g/dL (ref 32.0–35.9)
MCV: 96 fL (ref 82–98)
MONO#: 0.6 10*3/uL (ref 0.1–0.9)
MONO%: 12.2 % (ref 0.0–13.0)
NEUT#: 3.2 10*3/uL (ref 1.5–6.5)
NEUT%: 63.6 % (ref 40.0–80.0)
RBC: 4.44 10*6/uL (ref 4.20–5.70)
RDW: 16.2 % — ABNORMAL HIGH (ref 11.1–15.7)
WBC: 5 10*3/uL (ref 4.0–10.0)

## 2016-04-21 LAB — COMPREHENSIVE METABOLIC PANEL
ALK PHOS: 51 U/L (ref 40–150)
ALT: 46 U/L (ref 0–55)
ANION GAP: 13 meq/L — AB (ref 3–11)
AST: 39 U/L — ABNORMAL HIGH (ref 5–34)
Albumin: 4 g/dL (ref 3.5–5.0)
BILIRUBIN TOTAL: 0.71 mg/dL (ref 0.20–1.20)
BUN: 29 mg/dL — ABNORMAL HIGH (ref 7.0–26.0)
CALCIUM: 10.2 mg/dL (ref 8.4–10.4)
CHLORIDE: 105 meq/L (ref 98–109)
CO2: 22 mEq/L (ref 22–29)
CREATININE: 1.6 mg/dL — AB (ref 0.7–1.3)
EGFR: 41 mL/min/{1.73_m2} — ABNORMAL LOW (ref >90)
Glucose: 173 mg/dl — ABNORMAL HIGH (ref 70–140)
Potassium: 4.4 mEq/L (ref 3.5–5.1)
Sodium: 141 mEq/L (ref 136–145)
TOTAL PROTEIN: 7.1 g/dL (ref 6.4–8.3)

## 2016-04-21 LAB — TSH: TSH: 1.82 u[IU]/mL (ref 0.35–4.50)

## 2016-04-21 LAB — T3, FREE: T3, Free: 3.4 pg/mL (ref 2.3–4.2)

## 2016-04-21 LAB — CHCC SATELLITE - SMEAR

## 2016-04-21 LAB — T4, FREE: FREE T4: 0.84 ng/dL (ref 0.60–1.60)

## 2016-04-21 NOTE — Progress Notes (Signed)
Hematology and Oncology Follow Up Visit  VINICIO LYNK [id number] 07-08-1941 75 y.o. 04/21/2016   Principle Diagnosis:   Chronic thrombocytopenia secondary to splenomegaly  Current Therapy:    Observation     Interim History:  Mr. Depass is back for follow-up. We first saw him back in August. At that point time, his platelet count was 84,000. We saw him in December, his platelet count was 80,000. This past April his blood count 71,000.  He has diabetes. He has atrial fibrillation. He has sleep apnea. He is on Xarelto. He is doing well with Xarelto. He had an ultrasound of the spleen. On ultrasound, his spleen was only 600 cm.  He continues on a lot of medications. He is diabetic. I suspect that these medications probably have something to do with his platelet count being a little down.  He's had no bleeding or bruising.  He's had no change in bowel or bladder habits.  Overall, his performance status is ECOG 1.  Medications:  Current Outpatient Prescriptions:  .  amoxicillin (AMOXIL) 500 MG capsule, Take 500 mg by mouth as directed. 6 prior to dental work, Disp: , Rfl:  .  carvedilol (COREG) 12.5 MG tablet, Take 1 tablet (12.5 mg total) by mouth 2 (two) times daily with a meal., Disp: 180 tablet, Rfl: 3 .  Coenzyme Q10 200 MG TABS, Take 1 tablet by mouth daily., Disp: , Rfl:  .  Cranberry 200 MG CAPS, Take 200 mg by mouth daily. , Disp: , Rfl:  .  empagliflozin (JARDIANCE) 25 MG TABS tablet, Take 25 mg by mouth daily., Disp: 90 tablet, Rfl: 1 .  ergocalciferol (VITAMIN D2) 50000 units capsule, Take 50,000 Units by mouth once a week., Disp: , Rfl:  .  ezetimibe (ZETIA) 10 MG tablet, Take 1 tablet (10 mg total) by mouth daily., Disp: 90 tablet, Rfl: 3 .  fenofibrate (TRICOR) 48 MG tablet, Take 1 tablet (48 mg total) by mouth daily., Disp: 90 tablet, Rfl: 3 .  furosemide (LASIX) 40 MG tablet, Take 1 tablet (40 mg total) by mouth daily., Disp: 90 tablet, Rfl: 3 .  insulin regular  human CONCENTRATED (HUMULIN R) 500 UNIT/ML injection, Inject 80-90 units in am, and 25-30 units before lunch and dinner, Disp: 60 mL, Rfl: 1 .  Insulin Syringe/Needle U-500 (BD INSULIN SYRINGE U-500) 31G X 6MM 0.5 ML MISC, Inject 2 each into the skin 2 (two) times daily with a meal. Use 3x a day, Disp: 270 each, Rfl: 3 .  metFORMIN (GLUCOPHAGE-XR) 500 MG 24 hr tablet, Take 2000 mg by mouth with dinner, Disp: 360 tablet, Rfl: 1 .  montelukast (SINGULAIR) 10 MG tablet, Take 10 mg by mouth at bedtime., Disp: , Rfl:  .  Multiple Vitamins-Minerals (CENTRUM SILVER PO), Take 1 tablet by mouth daily., Disp: , Rfl:  .  nitroGLYCERIN (NITROSTAT) 0.4 MG SL tablet, Place 1 tablet (0.4 mg total) under the tongue every 5 (five) minutes as needed for chest pain., Disp: 25 tablet, Rfl: 3 .  NON FORMULARY, Methyfolate 2000 MCG daily, Disp: , Rfl:  .  olmesartan (BENICAR) 40 MG tablet, Take 1 tablet (40 mg total) by mouth 2 (two) times daily., Disp: 180 tablet, Rfl: 3 .  omega-3 acid ethyl esters (LOVAZA) 1 g capsule, Take 2 capsules (2 g total) by mouth 2 (two) times daily., Disp: 360 capsule, Rfl: 3 .  potassium chloride (K-DUR,KLOR-CON) 10 MEQ tablet, Take 2 tablets (20 mEq total) by mouth 2 (two) times daily., Disp:  360 tablet, Rfl: 3 .  pravastatin (PRAVACHOL) 80 MG tablet, Take 1 tablet (80 mg total) by mouth daily., Disp: 90 tablet, Rfl: 3 .  Probiotic Product (PROBIOTIC DAILY PO), Take 1 tablet by mouth daily., Disp: , Rfl:  .  rivaroxaban (XARELTO) 20 MG TABS tablet, Take 1 tablet (20 mg total) by mouth daily with supper., Disp: 90 tablet, Rfl: 3 .  spironolactone (ALDACTONE) 25 MG tablet, Take 1 tablet (25 mg total) by mouth daily., Disp: 90 tablet, Rfl: 3  Allergies:  Allergies  Allergen Reactions  . Adhesive [Tape]     Rash   . Iodine     flushing  . Seasonal Ic [Cholestatin]   . Contrast Media [Iodinated Diagnostic Agents] Rash and Other (See Comments)    flushing EXTREME FLUSHING     Past  Medical History, Surgical history, Social history, and Family History were reviewed and updated.  Review of Systems: As above  Physical Exam:  height is 6' (1.829 m) and weight is 340 lb (154.2 kg) (abnormal). His oral temperature is 97.7 F (36.5 C). His blood pressure is 153/74 (abnormal) and his pulse is 84. His respiration is 18.   Wt Readings from Last 3 Encounters:  04/21/16 (!) 340 lb (154.2 kg)  04/20/16 (!) 345 lb (156.5 kg)  03/24/16 (!) 337 lb 12.8 oz (153.2 kg)     Morbidly obese white male in no obvious distress. Head and neck exam shows no ocular or oral lesions. He has no palpable cervical or supraclavicular lymph nodes. Lungs are clear. Cardiac exam irregular rate and irrhythm, consistent with atrial fibrillation. He has no murmurs, rubs or bruits. Abdomen is obese but soft. He has decent bowel sounds. There is no guarding or rebound tenderness. He has no fluid wave. There is no obvious hepatomegaly. I cannot palpate his spleen tip. Back exam shows no tenderness over the spine, ribs or hips. Extremities shows some chronic nonpitting edema in his lower legs. Skin exam shows no rashes, ecchymoses or petechia. Neurological exam shows no focal neurological deficits.  Lab Results  Component Value Date   WBC 5.0 04/21/2016   HGB 14.3 04/21/2016   HCT 42.7 04/21/2016   MCV 96 04/21/2016   PLT 81 Platelet count consistent in citrate (L) 04/21/2016     Chemistry      Component Value Date/Time   NA 139 10/06/2015 1058   K 4.3 10/06/2015 1058   CL 103 10/06/2015 1058   CO2 27 10/06/2015 1058   BUN 28 (H) 10/06/2015 1058   CREATININE 1.29 (H) 10/06/2015 1058      Component Value Date/Time   CALCIUM 9.0 10/06/2015 1058   ALKPHOS 48 08/27/2015 0001   AST 45 (H) 08/27/2015 0001   ALT 42 08/27/2015 0001   BILITOT 0.6 08/27/2015 0001         Impression and Plan: Mr. Abel is a 75 year old white male. He has moderate thrombocytopenia. His platelet count isHolding fairly  steady right now.  I think that he probably has some splenic sequestration. He also may have some medication induced thrombocytopenia. However, he needs to be on his medications.  I still do not think that he needs a bone marrow test. I looked at his blood smear. I do not see any atypical lymphocytes were immature myeloid cells. His platelets showed some large platelets that were well granulated. He had no nucleated red cells or teardrop cells.  I think we probably get him back in another 6 months or so.  Despite the thrombocytopenia, I think his risk of bleeding is minimal. I think his platelets are very functional. I do not see any problem with him being on Xarelto.   [name], MD 8/2/201710:30 AM

## 2016-04-22 ENCOUNTER — Encounter: Payer: Self-pay | Admitting: Hematology & Oncology

## 2016-05-04 ENCOUNTER — Encounter: Payer: Self-pay | Admitting: Hematology & Oncology

## 2016-05-08 ENCOUNTER — Encounter (INDEPENDENT_AMBULATORY_CARE_PROVIDER_SITE_OTHER): Payer: Self-pay

## 2016-05-20 ENCOUNTER — Ambulatory Visit (INDEPENDENT_AMBULATORY_CARE_PROVIDER_SITE_OTHER): Payer: Medicare Other | Admitting: *Deleted

## 2016-05-20 DIAGNOSIS — Z95810 Presence of automatic (implantable) cardiac defibrillator: Secondary | ICD-10-CM | POA: Diagnosis not present

## 2016-05-20 DIAGNOSIS — I255 Ischemic cardiomyopathy: Secondary | ICD-10-CM

## 2016-05-20 NOTE — Progress Notes (Signed)
Remote ICD transmission.   

## 2016-05-21 ENCOUNTER — Encounter: Payer: Self-pay | Admitting: Cardiology

## 2016-05-25 LAB — CUP PACEART REMOTE DEVICE CHECK
Battery Remaining Percentage: 68 %
Battery Voltage: 2.96 V
Date Time Interrogation Session: 20170831060029
HIGH POWER IMPEDANCE MEASURED VALUE: 83 Ohm
HighPow Impedance: 83 Ohm
Implantable Lead Implant Date: 20140320
Implantable Lead Location: 753859
Implantable Lead Location: 753860
Lead Channel Impedance Value: 410 Ohm
Lead Channel Pacing Threshold Amplitude: 0.75 V
Lead Channel Pacing Threshold Pulse Width: 0.5 ms
Lead Channel Sensing Intrinsic Amplitude: 12 mV
Lead Channel Setting Pacing Pulse Width: 0.5 ms
Lead Channel Setting Sensing Sensitivity: 0.5 mV
MDC IDC LEAD IMPLANT DT: 20140320
MDC IDC MSMT BATTERY REMAINING LONGEVITY: 73 mo
MDC IDC MSMT LEADCHNL RA IMPEDANCE VALUE: 390 Ohm
MDC IDC MSMT LEADCHNL RA SENSING INTR AMPL: 2.6 mV
MDC IDC SET LEADCHNL RV PACING AMPLITUDE: 2 V
MDC IDC STAT BRADY RV PERCENT PACED: 1 %
Pulse Gen Serial Number: 7067730

## 2016-05-27 ENCOUNTER — Other Ambulatory Visit (HOSPITAL_BASED_OUTPATIENT_CLINIC_OR_DEPARTMENT_OTHER): Payer: Medicare Other

## 2016-05-27 ENCOUNTER — Encounter: Payer: Self-pay | Admitting: Hematology & Oncology

## 2016-05-27 ENCOUNTER — Ambulatory Visit (HOSPITAL_BASED_OUTPATIENT_CLINIC_OR_DEPARTMENT_OTHER): Payer: Medicare Other | Admitting: Hematology & Oncology

## 2016-05-27 VITALS — BP 149/67 | HR 85 | Temp 98.0°F | Resp 18 | Ht 73.0 in | Wt 339.0 lb

## 2016-05-27 DIAGNOSIS — N2889 Other specified disorders of kidney and ureter: Secondary | ICD-10-CM

## 2016-05-27 DIAGNOSIS — R161 Splenomegaly, not elsewhere classified: Secondary | ICD-10-CM | POA: Diagnosis not present

## 2016-05-27 DIAGNOSIS — Z7901 Long term (current) use of anticoagulants: Secondary | ICD-10-CM | POA: Diagnosis not present

## 2016-05-27 DIAGNOSIS — D696 Thrombocytopenia, unspecified: Secondary | ICD-10-CM

## 2016-05-27 LAB — CBC WITH DIFFERENTIAL (CANCER CENTER ONLY)
BASO#: 0 10*3/uL (ref 0.0–0.2)
BASO%: 0.7 % (ref 0.0–2.0)
EOS ABS: 0.2 10*3/uL (ref 0.0–0.5)
EOS%: 4.5 % (ref 0.0–7.0)
HCT: 42.9 % (ref 38.7–49.9)
HGB: 14.5 g/dL (ref 13.0–17.1)
LYMPH#: 0.9 10*3/uL (ref 0.9–3.3)
LYMPH%: 20.5 % (ref 14.0–48.0)
MCH: 32.2 pg (ref 28.0–33.4)
MCHC: 33.8 g/dL (ref 32.0–35.9)
MCV: 95 fL (ref 82–98)
MONO#: 0.4 10*3/uL (ref 0.1–0.9)
MONO%: 9.8 % (ref 0.0–13.0)
NEUT#: 2.9 10*3/uL (ref 1.5–6.5)
NEUT%: 64.5 % (ref 40.0–80.0)
RBC: 4.5 10*6/uL (ref 4.20–5.70)
RDW: 15.2 % (ref 11.1–15.7)
WBC: 4.5 10*3/uL (ref 4.0–10.0)

## 2016-05-27 LAB — CHCC SATELLITE - SMEAR

## 2016-05-27 NOTE — Progress Notes (Signed)
Hematology and Oncology Follow Up Visit  Robert Gregory [id number] [birth year] 75 y.o. 05/27/2016   Principle Diagnosis:   Chronic thrombocytopenia secondary to splenomegaly  1.57 m exophytic lesion lower pole left kidney  Current Therapy:    Observation     Interim History:  Robert Gregory is back for follow-up. We got ultrasound of his abdomen back in August. This showed his chronic splenomegaly. Despite Robert Gregory was on 600 cubic cm. However, the radiologist noted a partially exophytic 1.5 cm lesion in the lower pole of the left kidney. This was noted back in August 2016. At that point time, it was 1.2 cm. I'm sure that at some point, but I will have to do a more intensive evaluation. He may need to have a CT scan done. We cannot do MRI because of his implantable defibrillator.  Otherwise, he is doing okay. His blood sugars seem be doing okay. He has a implantable defibrillator so we cannot do any MRIs.  He's had no abdominal pain. He's had no bleeding. He's had no bruising. He's had no leg swelling. He's had no cough or shortness of breath.  Overall, his performance status is ECOG 1.  Medications:  Current Outpatient Prescriptions:  .  amoxicillin (AMOXIL) 500 MG capsule, Take 500 mg by mouth as directed. 6 prior to dental work, Disp: , Rfl:  .  carvedilol (COREG) 12.5 MG tablet, Take 1 tablet (12.5 mg total) by mouth 2 (two) times daily with a meal., Disp: 180 tablet, Rfl: 3 .  Coenzyme Q10 200 MG TABS, Take 1 tablet by mouth daily., Disp: , Rfl:  .  Cranberry 200 MG CAPS, Take 200 mg by mouth daily. , Disp: , Rfl:  .  empagliflozin (JARDIANCE) 25 MG TABS tablet, Take 25 mg by mouth daily., Disp: 90 tablet, Rfl: 1 .  ergocalciferol (VITAMIN D2) 50000 units capsule, Take 50,000 Units by mouth once a week., Disp: , Rfl:  .  ezetimibe (ZETIA) 10 MG tablet, Take 1 tablet (10 mg total) by mouth daily., Disp: 90 tablet, Rfl: 3 .  fenofibrate (TRICOR) 48 MG tablet, Take 1 tablet (48 mg total) by  mouth daily., Disp: 90 tablet, Rfl: 3 .  furosemide (LASIX) 40 MG tablet, Take 1 tablet (40 mg total) by mouth daily., Disp: 90 tablet, Rfl: 3 .  insulin regular human CONCENTRATED (HUMULIN R) 500 UNIT/ML injection, Inject 80-90 units in am, and 25-30 units before lunch and dinner, Disp: 60 mL, Rfl: 1 .  Insulin Syringe/Needle U-500 (BD INSULIN SYRINGE U-500) 31G X 6MM 0.5 ML MISC, Inject 2 each into the skin 2 (two) times daily with a meal. Use 3x a day, Disp: 270 each, Rfl: 3 .  metFORMIN (GLUCOPHAGE-XR) 500 MG 24 hr tablet, Take 2000 mg by mouth with dinner, Disp: 360 tablet, Rfl: 1 .  montelukast (SINGULAIR) 10 MG tablet, Take 10 mg by mouth at bedtime., Disp: , Rfl:  .  Multiple Vitamins-Minerals (CENTRUM SILVER PO), Take 1 tablet by mouth daily., Disp: , Rfl:  .  nitroGLYCERIN (NITROSTAT) 0.4 MG SL tablet, Place 1 tablet (0.4 mg total) under the tongue every 5 (five) minutes as needed for chest pain., Disp: 25 tablet, Rfl: 3 .  NON FORMULARY, Methyfolate 2000 MCG daily, Disp: , Rfl:  .  olmesartan (BENICAR) 40 MG tablet, Take 1 tablet (40 mg total) by mouth 2 (two) times daily., Disp: 180 tablet, Rfl: 3 .  omega-3 acid ethyl esters (LOVAZA) 1 g capsule, Take 2 capsules (2 g total)  by mouth 2 (two) times daily., Disp: 360 capsule, Rfl: 3 .  potassium chloride (K-DUR,KLOR-CON) 10 MEQ tablet, Take 2 tablets (20 mEq total) by mouth 2 (two) times daily., Disp: 360 tablet, Rfl: 3 .  pravastatin (PRAVACHOL) 80 MG tablet, Take 1 tablet (80 mg total) by mouth daily., Disp: 90 tablet, Rfl: 3 .  Probiotic Product (PROBIOTIC DAILY PO), Take 1 tablet by mouth daily., Disp: , Rfl:  .  rivaroxaban (XARELTO) 20 MG TABS tablet, Take 1 tablet (20 mg total) by mouth daily with supper., Disp: 90 tablet, Rfl: 3 .  spironolactone (ALDACTONE) 25 MG tablet, Take 1 tablet (25 mg total) by mouth daily., Disp: 90 tablet, Rfl: 3  Allergies:  Allergies  Allergen Reactions  . Adhesive [Tape]     Rash   . Iodine      flushing  . Seasonal Ic [Cholestatin]   . Contrast Media [Iodinated Diagnostic Agents] Rash and Other (See Comments)    flushing EXTREME FLUSHING     Past Medical History, Surgical history, Social history, and Family History were reviewed and updated.  Review of Systems: As above  Physical Exam:  height is 6' 1"  (1.854 m) and weight is 339 lb (153.8 kg) (abnormal). His oral temperature is 98 F (36.7 C). His blood pressure is 149/67 (abnormal) and his pulse is 85. His respiration is 18.   Wt Readings from Last 3 Encounters:  05/27/16 (!) 339 lb (153.8 kg)  05/08/16 (!) 320 lb (145.2 kg)  04/21/16 (!) 340 lb (154.2 kg)     Morbidly obese white male in no obvious distress. Head and neck exam shows no ocular or oral lesions. He has no palpable cervical or supraclavicular lymph nodes. Lungs are clear. Cardiac exam irregular rate and irrhythm, consistent with atrial fibrillation. He has no murmurs, rubs or bruits. Abdomen is obese but soft. He has decent bowel sounds. There is no guarding or rebound tenderness. He has no fluid wave. There is no obvious hepatomegaly. I cannot palpate his spleen tip. Back exam shows no tenderness over the spine, ribs or hips. Extremities shows some chronic nonpitting edema in his lower legs. Skin exam shows no rashes, ecchymoses or petechia. Neurological exam shows no focal neurological deficits.  Lab Results  Component Value Date   WBC 4.5 05/27/2016   HGB 14.5 05/27/2016   HCT 42.9 05/27/2016   MCV 95 05/27/2016   PLT 70 Platelet count consistent in citrate (L) 05/27/2016     Chemistry      Component Value Date/Time   NA 141 04/21/2016 0852   K 4.4 04/21/2016 0852   CL 103 10/06/2015 1058   CO2 22 04/21/2016 0852   BUN 29.0 (H) 04/21/2016 0852   CREATININE 1.6 (H) 04/21/2016 0852      Component Value Date/Time   CALCIUM 10.2 04/21/2016 0852   ALKPHOS 51 04/21/2016 0852   AST 39 (H) 04/21/2016 0852   ALT 46 04/21/2016 0852   BILITOT 0.71  04/21/2016 0852         Impression and Plan: Robert Gregory is a 75 year old white male. He has moderate thrombocytopenia. His platelet count isHolding fairly steady right now.  I think that he probably has some splenic sequestration. He also may have some medication induced thrombocytopenia. However, he needs to be on his medications.  The issue now is this exophytic lesion in the left kidney. This is small. It is on 1.5 cm. However, we will have to follow-up with this. I probably will get  an ultrasound we see him back.   Otherwise, I think Mr. Haegele is doing fantastic. Everything is holding very stable with his labs. His platelet count is down just a little bit but this tends to fluctuate for him.  He is still on the Xarelto. Again I don't see any problems with him being on Xarelto.  I'll see him back in 5 months.   [name], MD 9/7/201711:17 AM

## 2016-06-16 DIAGNOSIS — L408 Other psoriasis: Secondary | ICD-10-CM | POA: Diagnosis not present

## 2016-06-16 DIAGNOSIS — L570 Actinic keratosis: Secondary | ICD-10-CM | POA: Diagnosis not present

## 2016-06-16 DIAGNOSIS — L988 Other specified disorders of the skin and subcutaneous tissue: Secondary | ICD-10-CM | POA: Diagnosis not present

## 2016-06-16 DIAGNOSIS — L309 Dermatitis, unspecified: Secondary | ICD-10-CM | POA: Diagnosis not present

## 2016-06-16 DIAGNOSIS — D1800 Hemangioma unspecified site: Secondary | ICD-10-CM | POA: Diagnosis not present

## 2016-06-16 DIAGNOSIS — L821 Other seborrheic keratosis: Secondary | ICD-10-CM | POA: Diagnosis not present

## 2016-06-17 ENCOUNTER — Encounter: Payer: Self-pay | Admitting: Internal Medicine

## 2016-06-17 ENCOUNTER — Ambulatory Visit (INDEPENDENT_AMBULATORY_CARE_PROVIDER_SITE_OTHER): Payer: Medicare Other | Admitting: Internal Medicine

## 2016-06-17 VITALS — BP 134/69 | HR 84 | Ht 73.0 in | Wt 339.0 lb

## 2016-06-17 DIAGNOSIS — E1122 Type 2 diabetes mellitus with diabetic chronic kidney disease: Secondary | ICD-10-CM | POA: Diagnosis not present

## 2016-06-17 DIAGNOSIS — N183 Chronic kidney disease, stage 3 unspecified: Secondary | ICD-10-CM

## 2016-06-17 DIAGNOSIS — I255 Ischemic cardiomyopathy: Secondary | ICD-10-CM

## 2016-06-17 DIAGNOSIS — Z794 Long term (current) use of insulin: Secondary | ICD-10-CM | POA: Diagnosis not present

## 2016-06-17 LAB — POCT GLYCOSYLATED HEMOGLOBIN (HGB A1C): Hemoglobin A1C: 7.3

## 2016-06-17 MED ORDER — METFORMIN HCL ER 500 MG PO TB24: ORAL_TABLET | ORAL | 1 refills | Status: DC

## 2016-06-17 NOTE — Patient Instructions (Addendum)
Please continue: - Metformin ER 2000 mg with dinner - Jardiance 25 mg in am - U500 insulin: - 80-90 units before breakfast - 25-30 units before lunch  - 25-30 units before dinner Please continue the U500 sliding scale: 150-175: + 2 units 176-200: + 3 units 201-225: + 4 units 226-300: + 5 units 301-325: + 6 units >325: + 7 units  Please return in 3 months with your sugar log.

## 2016-06-17 NOTE — Progress Notes (Signed)
Patient ID: Robert Gregory, male   DOB: Aug 19, 1941, 75 y.o.   MRN: [id number]  HPI: Robert Gregory is a 75 y.o.-year-old male, returning for f/u for DM2, dx in 2004, insulin-dependent, uncontrolled, with complications (CAD - Ischemic CMP, s/p CABG x5 in 1997, CHF; CKD stage 3). Last visit 3.5 mo ago.  Last hemoglobin A1c was: Lab Results  Component Value Date   HGBA1C 7.6 02/24/2016   HGBA1C 7.1 11/24/2015   HGBA1C 7.6 08/25/2015   Pt is on a regimen of: - Metformin ER 2000 mg with dinner - Jardiance 25 mg in am - U500 insulin: - 80-90 units before breakfast - 25-30 units before lunch  - 25-30 units before dinner  - U500 sliding scale (added 02/2016): 150-175: + 2 units  176-200: + 3 units 201-225: + 4 units 226-300: + 5 units 301-325: + 6 units >325: + 7 units Stopped Cinnamon 500 mg 2x a day He gained 50 lbs with Actos in the past. He was on Invokana 100 mg in am  Pt checks his sugars 3x a day and they are better: - am: 150s >> 148-186, 232, 264 >> 150-183, 221 >> 119, 135-257, 281, 300 >> 157-201 >> 144-190, 208 - 2h after b'fast: n/c >> 141-155 >> 191 >> n/c - before lunch: n/c >> 110-120 >> 130-309 >> 191-273 >> 151, 157 - 2h after lunch: n/c >> 109-241 >> 308, 321  (mex meal) >>  213-272 - before dinner: 130s >> 141-236 >> 109,128-160, 271x1 >> 169-299 >> 106, 130-191, 283 >> 75, 96, 106-172, 225 - 2h after dinner: n/c >> 126-277, 290 >> 126, 151-215 >> 118-230, 303 >> 136-245, 302 (mex meal) >> 108, 134-164, 308 - bedtime: 150-170 (if <120 - eat a snack) >> 95, 108-230 >> 111-201 >> 140-196 >> 130-165, 310 >> 105-164, 257 - nighttime: n/c >> 84-226 >> 80x2 >> n/c >> 144 >> 164, 176 No lows. Lowest sugar was 84 >> 80 x2 (late evening) >> 106 >> 75; he has hypoglycemia awareness at 70.  Highest sugar was 330 (rarely) - after carb-loaded meals >> 300 (chinese food) >> 220x1 >> 308  Glucometer: [employer] 2  Pt's meals are: - Breakfast: cereals + 2% milk, OJ ; sausage +  eggs + toast - Lunch: soup + sandwich (lightest) - Dinner: meat + veggies + starch  - Snacks: 1- pm: peanuts, V8 juice  - + mild CKD, last BUN/creatinine:  Lab Results  Component Value Date   BUN 29.0 (H) 04/21/2016   CREATININE 1.6 (H) 04/21/2016  On Benicar 80.  - last set of lipids: Lab Results  Component Value Date   CHOL 121 (L) 08/27/2015   HDL 20 (L) 08/27/2015   LDLCALC 31 08/27/2015   TRIG 350 (H) 08/27/2015   CHOLHDL 6.1 (H) 08/27/2015  On Pravachol 80. - last eye exam was in 10/2015. No DR.  - no numbness and tingling in his feet.  ROS: Constitutional: + weight gain, no fatigue, + subjective hyperthermia Eyes: no blurry vision, no xerophthalmia ENT: no sore throat, no nodules palpated in throat, no dysphagia/odynophagia, no hoarseness Cardiovascular: no CP/SOB/no palpitations/leg swelling Respiratory: no cough/SOB Gastrointestinal: no N/V/D/C Musculoskeletal: no muscle/joint aches Skin: + rash Neurological: no tremors/numbness/tingling/dizziness  I reviewed pt's medications, allergies, PMH, social hx, family hx, and changes were documented in the history of present illness. Otherwise, unchanged from my initial visit note.  Past Medical History:  Diagnosis Date  . Arthritis   . Atrial fibrillation ([place])  takes Xarelto daily  . CAD (coronary artery disease)   . Cardiomyopathy, ischemic 11/07/2013  . Cholelithiasis   . Chronic back pain    scoliosis/arthritis  . Complication of anesthesia    forgetful for about 2-3wks after anesthesia  . Congestive heart failure ([place])    takes Spironolactone daily  . Diabetes mellitus ([place])    takes Invokamet daily as well as Hum U  . History of colon polyps   . History of kidney stones   . Hyperlipidemia    takes Sanmina-SCI daily  . Hyperlipidemia 11/07/2013  . Hypertension    takes Benicar and Coreg daily  . ICD (implantable cardioverter-defibrillator) battery depletion   . ICD (implantable  cardioverter-defibrillator) in place 11/07/2013  . Ischemic cardiomyopathy   . Ischemic cardiomyopathy   . Joint pain   . Myocardial infarction ([place]) 1997   on the OR table   . Nephrolithiasis   . Obstructive sleep apnea   . Peripheral neuropathy ([place])   . Pneumonia    as a child  . Spinal stenosis   . Thrombocytopenia ([place])   . Total knee replacement status 11/29/2014  . Type 2 diabetes mellitus with stage 3 chronic kidney disease ([place]) 05/19/2015  . Urinary frequency   . Urinary urgency    Past Surgical History:  Procedure Laterality Date  . CARDIAC CATHETERIZATION N/A 10/02/2015   Procedure: Left Heart Cath and Cors/Grafts Angiography;  Surgeon: Belva Crome, MD;  Location: [facility];  Service: Cardiovascular;  Laterality: N/A;  . COLONOSCOPY    . coronary artery bypass and graft  1997   x 5  . ESOPHAGOGASTRODUODENOSCOPY    . ICD placed    . LAMINECTOMY    . LITHOTRIPSY    . Right rotator cuff surgery    . TONSILLECTOMY     adenoids  . TOTAL KNEE ARTHROPLASTY Left   . TOTAL KNEE ARTHROPLASTY Right 11/29/2014   Procedure: RIGHT TOTAL KNEE ARTHROPLASTY;  Surgeon: Leandrew Koyanagi, MD;  Location: [facility];  Service: Orthopedics;  Laterality: Right;   Social History   Social History  . Marital status: Married    Spouse name: N/A  . Number of children: N/A  . Years of education: N/A   Occupational History  .      Retired from [employer]  . Smoking status: Never Smoker  . Smokeless tobacco: Never Used  . Alcohol use 0.0 oz/week     Comment: once drink a month  . Drug use: No  . Sexual activity: Not on file   Other Topics Concern  . Not on file   Social History Narrative  . No narrative on file   Current Outpatient Prescriptions on File Prior to Visit  Medication Sig Dispense Refill  . amoxicillin (AMOXIL) 500 MG capsule Take 500 mg by mouth as directed. 6 prior to dental work    . carvedilol (COREG) 12.5 MG tablet Take 1 tablet  (12.5 mg total) by mouth 2 (two) times daily with a meal. 180 tablet 3  . Coenzyme Q10 200 MG TABS Take 1 tablet by mouth daily.    . Cranberry 200 MG CAPS Take 200 mg by mouth daily.     . empagliflozin (JARDIANCE) 25 MG TABS tablet Take 25 mg by mouth daily. 90 tablet 1  . ergocalciferol (VITAMIN D2) 50000 units capsule Take 50,000 Units by mouth once a week.    . ezetimibe (ZETIA) 10 MG tablet  Take 1 tablet (10 mg total) by mouth daily. 90 tablet 3  . fenofibrate (TRICOR) 48 MG tablet Take 1 tablet (48 mg total) by mouth daily. 90 tablet 3  . furosemide (LASIX) 40 MG tablet Take 1 tablet (40 mg total) by mouth daily. 90 tablet 3  . insulin regular human CONCENTRATED (HUMULIN R) 500 UNIT/ML injection Inject 80-90 units in am, and 25-30 units before lunch and dinner 60 mL 1  . Insulin Syringe/Needle U-500 (BD INSULIN SYRINGE U-500) 31G X 6MM 0.5 ML MISC Inject 2 each into the skin 2 (two) times daily with a meal. Use 3x a day 270 each 3  . metFORMIN (GLUCOPHAGE-XR) 500 MG 24 hr tablet Take 2000 mg by mouth with dinner 360 tablet 1  . montelukast (SINGULAIR) 10 MG tablet Take 10 mg by mouth at bedtime.    . Multiple Vitamins-Minerals (CENTRUM SILVER PO) Take 1 tablet by mouth daily.    . nitroGLYCERIN (NITROSTAT) 0.4 MG SL tablet Place 1 tablet (0.4 mg total) under the tongue every 5 (five) minutes as needed for chest pain. 25 tablet 3  . NON FORMULARY Methyfolate 2000 MCG daily    . olmesartan (BENICAR) 40 MG tablet Take 1 tablet (40 mg total) by mouth 2 (two) times daily. 180 tablet 3  . omega-3 acid ethyl esters (LOVAZA) 1 g capsule Take 2 capsules (2 g total) by mouth 2 (two) times daily. 360 capsule 3  . potassium chloride (K-DUR,KLOR-CON) 10 MEQ tablet Take 2 tablets (20 mEq total) by mouth 2 (two) times daily. 360 tablet 3  . pravastatin (PRAVACHOL) 80 MG tablet Take 1 tablet (80 mg total) by mouth daily. 90 tablet 3  . Probiotic Product (PROBIOTIC DAILY PO) Take 1 tablet by mouth daily.     . rivaroxaban (XARELTO) 20 MG TABS tablet Take 1 tablet (20 mg total) by mouth daily with supper. 90 tablet 3  . spironolactone (ALDACTONE) 25 MG tablet Take 1 tablet (25 mg total) by mouth daily. 90 tablet 3   No current facility-administered medications on file prior to visit.    Allergies  Allergen Reactions  . Adhesive [Tape]     Rash   . Iodine     flushing  . Seasonal Ic [Cholestatin]   . Contrast Media [Iodinated Diagnostic Agents] Rash and Other (See Comments)    flushing EXTREME FLUSHING    Family History  Problem Relation Age of Onset  . Heart disease      No family history   PE: BP 134/69   Pulse 84   Ht 6' 1"  (1.854 m)   Wt (!) 339 lb (153.8 kg)   BMI 44.73 kg/m  Body mass index is 44.73 kg/m. Wt Readings from Last 3 Encounters:  06/17/16 (!) 339 lb (153.8 kg)  05/27/16 (!) 339 lb (153.8 kg)  05/08/16 (!) 320 lb (145.2 kg)   Constitutional: obese, in NAD Eyes: PERRLA, EOMI, no exophthalmos ENT: moist mucous membranes, no thyromegaly, no cervical lymphadenopathy Cardiovascular: RRR, No MRG Respiratory: CTA B Gastrointestinal: abdomen soft, NT, ND, BS+ Musculoskeletal: no deformities, strength intact in all 4 Skin: moist, warm, no rashes Neurological: + tremor with outstretched hands, DTR normal in all 4  ASSESSMENT: 1. DM2, insulin-dependent, uncontrolled, with complications - CAD, s/p CABG 5x in 1997 (Dr Roxy Horseman), iCMP >> CHF (Dr Stanford Breed) - CKD stage 3  PLAN:  1. Patient with long-standing, uncontrolled diabetes, on oral antidiabetic regimen + U500 insulin, with worsened control at last visit (HbA1c 7.6%) due  to several doses of steroids for bronchitis. We added a sliding scale of U500 and i advised to cut down portions so that his insulin resistance decreases. Sugars are better today. Highest sugars after lunch, but they decrease towards dinnertime >> we cannot increase the U500 dose with lunch. Similarly, sugars in am are higher than goal, but at  bedtime they are close to goal >> cannot increase U500 dose with dinner. - at this visit, HbA1c is better, at 7.3%, which is actually at goal for him 2/2 age - I suggested to:  Patient Instructions  Please continue: - Metformin ER 2000 mg with dinner - Jardiance 25 mg in am - U500 insulin: - 80-90 units before breakfast - 25-30 units before lunch  - 25-30 units before dinner Please continue the U500 sliding scale: 150-175: + 2 units 176-200: + 3 units 201-225: + 4 units 226-300: + 5 units 301-325: + 6 units >325: + 7 units  Please return in 1.5 months with your sugar log.  - continue checking sugars at different times of the day - check 3 times a day, rotating checks - advised for yearly eye exams >> he is UTD - Return to clinic in 1.5 mo with sugar log   Philemon Kingdom, MD PhD [facility] Endocrinology

## 2016-06-18 DIAGNOSIS — L578 Other skin changes due to chronic exposure to nonionizing radiation: Secondary | ICD-10-CM | POA: Diagnosis not present

## 2016-06-24 DIAGNOSIS — Z23 Encounter for immunization: Secondary | ICD-10-CM | POA: Diagnosis not present

## 2016-07-23 DIAGNOSIS — J209 Acute bronchitis, unspecified: Secondary | ICD-10-CM | POA: Diagnosis not present

## 2016-08-13 DIAGNOSIS — J042 Acute laryngotracheitis: Secondary | ICD-10-CM | POA: Diagnosis not present

## 2016-08-19 ENCOUNTER — Ambulatory Visit (INDEPENDENT_AMBULATORY_CARE_PROVIDER_SITE_OTHER): Payer: Medicare Other | Admitting: *Deleted

## 2016-08-19 DIAGNOSIS — I255 Ischemic cardiomyopathy: Secondary | ICD-10-CM | POA: Diagnosis not present

## 2016-08-19 NOTE — Progress Notes (Signed)
Remote ICD transmission.   

## 2016-08-20 ENCOUNTER — Ambulatory Visit (INDEPENDENT_AMBULATORY_CARE_PROVIDER_SITE_OTHER): Payer: Medicare Other | Admitting: Orthopaedic Surgery

## 2016-08-23 DIAGNOSIS — Z794 Long term (current) use of insulin: Secondary | ICD-10-CM | POA: Diagnosis not present

## 2016-08-23 DIAGNOSIS — H65116 Acute and subacute allergic otitis media (mucoid) (sanguinous) (serous), recurrent, bilateral: Secondary | ICD-10-CM | POA: Diagnosis not present

## 2016-08-23 DIAGNOSIS — E1165 Type 2 diabetes mellitus with hyperglycemia: Secondary | ICD-10-CM | POA: Diagnosis not present

## 2016-08-23 DIAGNOSIS — R05 Cough: Secondary | ICD-10-CM | POA: Diagnosis not present

## 2016-08-23 DIAGNOSIS — I2510 Atherosclerotic heart disease of native coronary artery without angina pectoris: Secondary | ICD-10-CM | POA: Diagnosis not present

## 2016-08-31 ENCOUNTER — Ambulatory Visit
Admission: RE | Admit: 2016-08-31 | Discharge: 2016-08-31 | Disposition: A | Discharge status: Home / Self Care | Payer: Medicare Other | Source: Ambulatory Visit | Attending: Internal Medicine | Admitting: Internal Medicine

## 2016-08-31 ENCOUNTER — Encounter: Payer: Self-pay | Admitting: Cardiology

## 2016-08-31 ENCOUNTER — Other Ambulatory Visit: Payer: Self-pay | Admitting: Internal Medicine

## 2016-08-31 DIAGNOSIS — R059 Cough, unspecified: Secondary | ICD-10-CM

## 2016-08-31 DIAGNOSIS — R05 Cough: Secondary | ICD-10-CM

## 2016-09-02 ENCOUNTER — Ambulatory Visit (INDEPENDENT_AMBULATORY_CARE_PROVIDER_SITE_OTHER): Payer: Medicare Other | Admitting: Orthopaedic Surgery

## 2016-09-16 ENCOUNTER — Ambulatory Visit: Payer: Medicare Other | Admitting: Internal Medicine

## 2016-09-17 ENCOUNTER — Encounter (INDEPENDENT_AMBULATORY_CARE_PROVIDER_SITE_OTHER): Payer: Self-pay | Admitting: Orthopaedic Surgery

## 2016-09-17 ENCOUNTER — Ambulatory Visit (INDEPENDENT_AMBULATORY_CARE_PROVIDER_SITE_OTHER): Payer: Medicare Other | Admitting: Orthopaedic Surgery

## 2016-09-17 ENCOUNTER — Ambulatory Visit (INDEPENDENT_AMBULATORY_CARE_PROVIDER_SITE_OTHER): Payer: Medicare Other

## 2016-09-17 VITALS — Ht 72.0 in | Wt 339.0 lb

## 2016-09-17 DIAGNOSIS — Z96651 Presence of right artificial knee joint: Secondary | ICD-10-CM

## 2016-09-17 DIAGNOSIS — I255 Ischemic cardiomyopathy: Secondary | ICD-10-CM | POA: Diagnosis not present

## 2016-09-17 NOTE — Progress Notes (Signed)
Office Visit Note   Patient: Robert Gregory           Date of Birth: Feb 06, 1941           MRN: [id number] Visit Date: 09/17/2016              Requested by: Seward Carol, MD [address] [place], [place] [address] PCP: Kandice Hams, MD   Assessment & Plan: Visit Diagnoses:  1. H/O total knee replacement, right     Plan: Patient is doing very well from my standpoint. X-rays show stable total knee replacement without any signs of complications. I will see him back in 3 years for his 5 year visit. He'll need two-view x-rays of the right knee at that time. He knows he can give me a call at any time should he need me with any other issues.  Follow-Up Instructions: Return in about 3 years (around 09/18/2019) for recheck right TKA.   Orders:  Orders Placed This Encounter  Procedures  . XR Knee 1-2 Views Right   No orders of the defined types were placed in this encounter.     Procedures: No procedures performed   Clinical Data: No additional findings.   Subjective: Chief Complaint  Patient presents with  . Right Knee - Follow-up    11/29/14 s/p a right total knee arthroplasty    Patient is 2 years status post right total knee replacement. He follows up today for his routine 2 year visit. He is without any complaints related to the right knee. He mainly complains of more back pain which she is seeing Dr. Louanne Skye for.      Review of Systems   Objective: Vital Signs: Ht 6' (1.829 m)   Wt (!) 339 lb (153.8 kg)   BMI 45.98 kg/m   Physical Exam  Ortho Exam Physical exam of the right knee shows excellent range of motion. Well-healed scar. Specialty Comments:  No specialty comments available.  Imaging: Xr Knee 1-2 Views Right  Result Date: 09/17/2016 Stable right total knee replacement    PMFS History: Patient Active Problem List   Diagnosis Date Noted  . Abnormal nuclear stress test   . Obstructive sleep apnea   . Nephrolithiasis   .  Thrombocytopenia ([place])   . Cholelithiasis   . ICD (implantable cardioverter-defibrillator) battery depletion   . Ischemic cardiomyopathy   . Hypertension   . Myocardial infarction   . Pneumonia   . Peripheral neuropathy ([place])   . Joint pain   . Chronic back pain   . History of colon polyps   . Urinary frequency   . History of kidney stones   . Diabetes mellitus ([place])   . Type 2 diabetes mellitus with stage 3 chronic kidney disease ([place]) 05/19/2015  . H/O total knee replacement, right 11/29/2014  . Pre-operative cardiovascular examination 11/20/2014  . CAD (coronary artery disease) 11/07/2013  . Atrial fibrillation ([place]) 11/07/2013  . ICD (implantable cardioverter-defibrillator) in place 11/07/2013  . Essential hypertension 11/07/2013  . Hyperlipidemia 11/07/2013  . Cardiomyopathy, ischemic 11/07/2013   Past Medical History:  Diagnosis Date  . Arthritis   . Atrial fibrillation ([place])    takes Xarelto daily  . CAD (coronary artery disease)   . Cardiomyopathy, ischemic 11/07/2013  . Cholelithiasis   . Chronic back pain    scoliosis/arthritis  . Complication of anesthesia    forgetful for about 2-3wks after anesthesia  . Congestive heart failure ([place])    takes Spironolactone  daily  . Diabetes mellitus ([place])    takes Invokamet daily as well as Hum U  . History of colon polyps   . History of kidney stones   . Hyperlipidemia    takes Sanmina-SCI daily  . Hyperlipidemia 11/07/2013  . Hypertension    takes Benicar and Coreg daily  . ICD (implantable cardioverter-defibrillator) battery depletion   . ICD (implantable cardioverter-defibrillator) in place 11/07/2013  . Ischemic cardiomyopathy   . Ischemic cardiomyopathy   . Joint pain   . Myocardial infarction 1997   on the OR table   . Nephrolithiasis   . Obstructive sleep apnea   . Peripheral neuropathy ([place])   . Pneumonia    as a child  . Spinal stenosis   . Thrombocytopenia ([place])   . Total knee  replacement status 11/29/2014  . Type 2 diabetes mellitus with stage 3 chronic kidney disease ([place]) 05/19/2015  . Urinary frequency   . Urinary urgency     Family History  Problem Relation Age of Onset  . Heart disease      No family history    Past Surgical History:  Procedure Laterality Date  . CARDIAC CATHETERIZATION N/A 10/02/2015   Procedure: Left Heart Cath and Cors/Grafts Angiography;  Surgeon: Belva Crome, MD;  Location: [facility];  Service: Cardiovascular;  Laterality: N/A;  . COLONOSCOPY    . coronary artery bypass and graft  1997   x 5  . ESOPHAGOGASTRODUODENOSCOPY    . ICD placed    . LAMINECTOMY    . LITHOTRIPSY    . Right rotator cuff surgery    . TONSILLECTOMY     adenoids  . TOTAL KNEE ARTHROPLASTY Left   . TOTAL KNEE ARTHROPLASTY Right 11/29/2014   Procedure: RIGHT TOTAL KNEE ARTHROPLASTY;  Surgeon: Leandrew Koyanagi, MD;  Location: [facility];  Service: Orthopedics;  Laterality: Right;   Social History   Occupational History  .      Retired from [employer]  . Smoking status: Never Smoker  . Smokeless tobacco: Never Used  . Alcohol use 0.0 oz/week     Comment: once drink a month  . Drug use: No  . Sexual activity: Not on file

## 2016-09-23 NOTE — Progress Notes (Deleted)
HPI: FU coronary artery disease. Patient is status post coronary artery bypassing graft in 1997. Patient had a LIMA to the LAD, saphenous vein graft to the diagonal, saphenous vein graft to the circumflex and sequential saphenous vein graft to the PDA and posterior lateral. His procedure was performed at [facility]. He subsequently moved to [facility]. Echocardiogram in 2005 showed an ejection fraction of 38%, restrictive filling and trace tricuspid regurgitation. Patient had ICD placed previously. Also with h/o atrial fibrillation. Abdominal ultrasound August 2016 showed no aneurysm. Nuclear study 12/16 showed EF 50; prior inferior MI with mild peri-infarct ischemia and moderate ischemia in the anterolateral wall. Cardiac catheterization January 2017 showed severe three-vessel coronary disease. There was total occlusion of the saphenous vein graft to the obtuse marginal, total occlusion of the saphenous vein graft to diagonal and there was a 50-70% stenosis in the sequential vein graft to the PDA and left ventricular branch and the LIMA to the LAD was patent. Ejection fraction 35-45%. Medical therapy recommended. Since last seen,   Current Outpatient Prescriptions  Medication Sig Dispense Refill  . amoxicillin (AMOXIL) 500 MG capsule Take 500 mg by mouth as directed. 6 prior to dental work    . carvedilol (COREG) 12.5 MG tablet Take 1 tablet (12.5 mg total) by mouth 2 (two) times daily with a meal. 180 tablet 3  . Coenzyme Q10 200 MG TABS Take 1 tablet by mouth daily.    . Cranberry 200 MG CAPS Take 200 mg by mouth daily.     . empagliflozin (JARDIANCE) 25 MG TABS tablet Take 25 mg by mouth daily. 90 tablet 1  . ergocalciferol (VITAMIN D2) 50000 units capsule Take 50,000 Units by mouth once a week.    . ezetimibe (ZETIA) 10 MG tablet Take 1 tablet (10 mg total) by mouth daily. 90 tablet 3  . fenofibrate (TRICOR) 48 MG tablet Take 1 tablet (48 mg total) by mouth daily. 90 tablet 3  .  furosemide (LASIX) 40 MG tablet Take 1 tablet (40 mg total) by mouth daily. 90 tablet 3  . insulin regular human CONCENTRATED (HUMULIN R) 500 UNIT/ML injection Inject 80-90 units in am, and 25-30 units before lunch and dinner 60 mL 1  . Insulin Syringe/Needle U-500 (BD INSULIN SYRINGE U-500) 31G X 6MM 0.5 ML MISC Inject 2 each into the skin 2 (two) times daily with a meal. Use 3x a day 270 each 3  . metFORMIN (GLUCOPHAGE-XR) 500 MG 24 hr tablet Take 2000 mg by mouth with dinner 360 tablet 1  . montelukast (SINGULAIR) 10 MG tablet Take 10 mg by mouth at bedtime.    . Multiple Vitamins-Minerals (CENTRUM SILVER PO) Take 1 tablet by mouth daily.    . nitroGLYCERIN (NITROSTAT) 0.4 MG SL tablet Place 1 tablet (0.4 mg total) under the tongue every 5 (five) minutes as needed for chest pain. 25 tablet 3  . NON FORMULARY Methyfolate 2000 MCG daily    . olmesartan (BENICAR) 40 MG tablet Take 1 tablet (40 mg total) by mouth 2 (two) times daily. 180 tablet 3  . omega-3 acid ethyl esters (LOVAZA) 1 g capsule Take 2 capsules (2 g total) by mouth 2 (two) times daily. 360 capsule 3  . potassium chloride (K-DUR,KLOR-CON) 10 MEQ tablet Take 2 tablets (20 mEq total) by mouth 2 (two) times daily. 360 tablet 3  . pravastatin (PRAVACHOL) 80 MG tablet Take 1 tablet (80 mg total) by mouth daily. 90 tablet 3  .  Probiotic Product (PROBIOTIC DAILY PO) Take 1 tablet by mouth daily.    . rivaroxaban (XARELTO) 20 MG TABS tablet Take 1 tablet (20 mg total) by mouth daily with supper. 90 tablet 3  . spironolactone (ALDACTONE) 25 MG tablet Take 1 tablet (25 mg total) by mouth daily. 90 tablet 3   No current facility-administered medications for this visit.      Past Medical History:  Diagnosis Date  . Arthritis   . Atrial fibrillation ([place])    takes Xarelto daily  . CAD (coronary artery disease)   . Cardiomyopathy, ischemic 11/07/2013  . Cholelithiasis   . Chronic back pain    scoliosis/arthritis  . Complication of  anesthesia    forgetful for about 2-3wks after anesthesia  . Congestive heart failure ([place])    takes Spironolactone daily  . Diabetes mellitus ([place])    takes Invokamet daily as well as Hum U  . History of colon polyps   . History of kidney stones   . Hyperlipidemia    takes Sanmina-SCI daily  . Hyperlipidemia 11/07/2013  . Hypertension    takes Benicar and Coreg daily  . ICD (implantable cardioverter-defibrillator) battery depletion   . ICD (implantable cardioverter-defibrillator) in place 11/07/2013  . Ischemic cardiomyopathy   . Ischemic cardiomyopathy   . Joint pain   . Myocardial infarction 1997   on the OR table   . Nephrolithiasis   . Obstructive sleep apnea   . Peripheral neuropathy ([place])   . Pneumonia    as a child  . Spinal stenosis   . Thrombocytopenia ([place])   . Total knee replacement status 11/29/2014  . Type 2 diabetes mellitus with stage 3 chronic kidney disease ([place]) 05/19/2015  . Urinary frequency   . Urinary urgency     Past Surgical History:  Procedure Laterality Date  . CARDIAC CATHETERIZATION N/A 10/02/2015   Procedure: Left Heart Cath and Cors/Grafts Angiography;  Surgeon: Belva Crome, MD;  Location: [place];  Service: Cardiovascular;  Laterality: N/A;  . COLONOSCOPY    . coronary artery bypass and graft  1997   x 5  . ESOPHAGOGASTRODUODENOSCOPY    . ICD placed    . LAMINECTOMY    . LITHOTRIPSY    . Right rotator cuff surgery    . TONSILLECTOMY     adenoids  . TOTAL KNEE ARTHROPLASTY Left   . TOTAL KNEE ARTHROPLASTY Right 11/29/2014   Procedure: RIGHT TOTAL KNEE ARTHROPLASTY;  Surgeon: Leandrew Koyanagi, MD;  Location: [facility];  Service: Orthopedics;  Laterality: Right;    Social History   Social History  . Marital status: Married    Spouse name: N/A  . Number of children: N/A  . Years of education: N/A   Occupational History  .      Retired from [employer]  . Smoking status: Never  Smoker  . Smokeless tobacco: Never Used  . Alcohol use 0.0 oz/week     Comment: once drink a month  . Drug use: No  . Sexual activity: Not on file   Other Topics Concern  . Not on file   Social History Narrative  . No narrative on file    Family History  Problem Relation Age of Onset  . Heart disease      No family history    ROS: no fevers or chills, productive cough, hemoptysis, dysphasia, odynophagia, melena, hematochezia, dysuria, hematuria, rash, seizure activity, orthopnea, PND, pedal  edema, claudication. Remaining systems are negative.  Physical Exam: Well-developed well-nourished in no acute distress.  Skin is warm and dry.  HEENT is normal.  Neck is supple.  Chest is clear to auscultation with normal expansion.  Cardiovascular exam is regular rate and rhythm.  Abdominal exam nontender or distended. No masses palpated. Extremities show no edema. neuro grossly intact  ECG

## 2016-09-27 DIAGNOSIS — R079 Chest pain, unspecified: Secondary | ICD-10-CM | POA: Diagnosis not present

## 2016-09-27 DIAGNOSIS — M542 Cervicalgia: Secondary | ICD-10-CM | POA: Diagnosis not present

## 2016-09-27 DIAGNOSIS — S0990XA Unspecified injury of head, initial encounter: Secondary | ICD-10-CM | POA: Diagnosis not present

## 2016-09-27 DIAGNOSIS — R51 Headache: Secondary | ICD-10-CM | POA: Diagnosis not present

## 2016-09-27 DIAGNOSIS — S299XXA Unspecified injury of thorax, initial encounter: Secondary | ICD-10-CM | POA: Diagnosis not present

## 2016-09-27 DIAGNOSIS — W182XXA Fall in (into) shower or empty bathtub, initial encounter: Secondary | ICD-10-CM | POA: Diagnosis not present

## 2016-09-27 DIAGNOSIS — R52 Pain, unspecified: Secondary | ICD-10-CM | POA: Diagnosis not present

## 2016-09-27 DIAGNOSIS — S199XXA Unspecified injury of neck, initial encounter: Secondary | ICD-10-CM | POA: Diagnosis not present

## 2016-09-29 ENCOUNTER — Ambulatory Visit: Payer: Medicare Other | Admitting: Cardiology

## 2016-09-29 LAB — CUP PACEART REMOTE DEVICE CHECK
Battery Remaining Longevity: 71 mo
Battery Remaining Percentage: 67 %
Battery Voltage: 2.96 V
Brady Statistic RV Percent Paced: 1 %
Date Time Interrogation Session: 20171130070015
HighPow Impedance: 87 Ohm
HighPow Impedance: 87 Ohm
Implantable Lead Implant Date: 20140320
Implantable Lead Implant Date: 20140320
Implantable Lead Location: 753859
Implantable Lead Location: 753860
Implantable Pulse Generator Implant Date: 20140320
Lead Channel Impedance Value: 410 Ohm
Lead Channel Impedance Value: 430 Ohm
Lead Channel Pacing Threshold Amplitude: 0.75 V
Lead Channel Pacing Threshold Pulse Width: 0.5 ms
Lead Channel Sensing Intrinsic Amplitude: 11.5 mV
Lead Channel Sensing Intrinsic Amplitude: 2.9 mV
Lead Channel Setting Pacing Amplitude: 2 V
Lead Channel Setting Pacing Pulse Width: 0.5 ms
Lead Channel Setting Sensing Sensitivity: 0.5 mV
Pulse Gen Serial Number: 7067730

## 2016-10-01 ENCOUNTER — Encounter: Payer: Self-pay | Admitting: Internal Medicine

## 2016-10-01 ENCOUNTER — Ambulatory Visit (INDEPENDENT_AMBULATORY_CARE_PROVIDER_SITE_OTHER): Payer: Medicare Other | Admitting: Internal Medicine

## 2016-10-01 VITALS — BP 144/88 | HR 83 | Ht 71.5 in | Wt 341.0 lb

## 2016-10-01 DIAGNOSIS — N183 Chronic kidney disease, stage 3 unspecified: Secondary | ICD-10-CM

## 2016-10-01 DIAGNOSIS — E1122 Type 2 diabetes mellitus with diabetic chronic kidney disease: Secondary | ICD-10-CM

## 2016-10-01 DIAGNOSIS — Z794 Long term (current) use of insulin: Secondary | ICD-10-CM

## 2016-10-01 LAB — POCT GLYCOSYLATED HEMOGLOBIN (HGB A1C): Hemoglobin A1C: 7.6

## 2016-10-01 MED ORDER — INSULIN SYRINGE/NEEDLE U-500 31G X 6MM 0.5 ML MISC: 1.0000 | Freq: Three times a day (TID) | 3 refills | Status: DC

## 2016-10-01 MED ORDER — EMPAGLIFLOZIN 25 MG PO TABS: 25.0000 mg | ORAL_TABLET | Freq: Every day | ORAL | 3 refills | Status: DC

## 2016-10-01 MED ORDER — METFORMIN HCL ER 500 MG PO TB24: ORAL_TABLET | ORAL | 3 refills | Status: DC

## 2016-10-01 MED ORDER — INSULIN REGULAR HUMAN (CONC) 500 UNIT/ML ~~LOC~~ SOLN: SUBCUTANEOUS | 3 refills | Status: DC

## 2016-10-01 NOTE — Patient Instructions (Addendum)
Please continue: - Metformin ER 2000 mg with dinner - Jardiance 25 mg in am - U500 insulin: - 80-90 units before breakfast - 25-30 units before lunch  - 25-30 units before dinner Please change the U500 sliding scale: 150-175: + 4 units 176-200: + 6 unit 201-225: + 8 units 226-300: + 10 units >301: + 12 units  Please return in 3 months with your sugar log.

## 2016-10-01 NOTE — Progress Notes (Signed)
Patient ID: [name], male   DOB: November 25, 1940, 76 y.o.   MRN: [id number]  HPI: Robert Gregory is a 76 y.o.-year-old male, returning for f/u for DM2, dx in 2004, insulin-dependent, uncontrolled, with complications (CAD - Ischemic CMP, s/p CABG x5 in 1997, CHF; CKD stage 3). Last visit 3 mo ago.  He had 2x courses of high dose steroids (6 mg Dexamethasone) for URI.  He fell early this week >> went to the ED [facility].  Last hemoglobin A1c was: Lab Results  Component Value Date   HGBA1C 7.3 06/17/2016   HGBA1C 7.6 02/24/2016   HGBA1C 7.1 11/24/2015   Pt is on a regimen of: - Metformin ER 2000 mg with dinner - Jardiance 25 mg in am - U500 insulin: - 80-90 units before breakfast - 25-30 units before lunch  - 25-30 units before dinner - not using Stopped Cinnamon 500 mg 2x a day He gained 50 lbs with Actos in the past. He was on Invokana 100 mg in am  Pt checks his sugars 3x a day and they were high on Dexamethasone >> now better: - am:119, 135-257, 281, 300 >> 157-201 >> 144-190, 208 >> 131-208 - 2h after b'fast: n/c >> 141-155 >> 191 >> n/c - before lunch: n/c >> 110-120 >> 130-309 >> 191-273 >> 151, 157 >> 173-269 - 2h after lunch: n/c >> 109-241 >> 308, 321  (mex meal) >>  213-272 >> 160-226, 300 - before dinner: 1169-299 >> 106, 130-191, 283 >> 75, 96, 106-172, 225 >>73, 93-187, 209 - 2h after dinner:  136-245, 302 (mex meal) >> 108, 134-164, 308 >> 97-283, 318 - bedtime:  111-201 >> 140-196 >> 130-165, 310 >> 105-164, 257 >> see above - nighttime: n/c >> 84-226 >> 80x2 >> n/c >> 144 >> 164, 176 >> 110-175, 246 No lows. Lowest sugar was 75 >> 93; he has hypoglycemia awareness at 70.  Highest sugar was 308  Glucometer: [employer] 2  Pt's meals are: - Breakfast: cereals + 2% milk, OJ ; sausage + eggs + toast - Lunch: soup + sandwich (lightest) - Dinner: meat + veggies + starch  - Snacks: 1- pm: peanuts, V8 juice  - + mild CKD, last BUN/creatinine:  Lab Results   Component Value Date   BUN 29.0 (H) 04/21/2016   CREATININE 1.6 (H) 04/21/2016  On Benicar 80.  - last set of lipids: Lab Results  Component Value Date   CHOL 121 (L) 08/27/2015   HDL 20 (L) 08/27/2015   LDLCALC 31 08/27/2015   TRIG 350 (H) 08/27/2015   CHOLHDL 6.1 (H) 08/27/2015  On Pravachol 80. - last eye exam was in 10/2015. No DR. Next appt next week. - no numbness and tingling in his feet.  ROS: Constitutional: + weight gain, no fatigue, no subjective hyper- or hypo-thermia Eyes: no blurry vision, no xerophthalmia ENT: no sore throat, no nodules palpated in throat, no dysphagia/odynophagia, no hoarseness Cardiovascular: no CP/SOB/no palpitations/leg swelling Respiratory: no cough/SOB Gastrointestinal: no N/V/D/C Musculoskeletal: no muscle/joint aches Skin: + rash Neurological: no tremors/numbness/tingling/dizziness  I reviewed pt's medications, allergies, PMH, social hx, family hx, and changes were documented in the history of present illness. Otherwise, unchanged from my initial visit note.  Past Medical History:  Diagnosis Date  . Arthritis   . Atrial fibrillation ([place])    takes Xarelto daily  . CAD (coronary artery disease)   . Cardiomyopathy, ischemic 11/07/2013  . Cholelithiasis   . Chronic back pain  scoliosis/arthritis  . Complication of anesthesia    forgetful for about 2-3wks after anesthesia  . Congestive heart failure ([place])    takes Spironolactone daily  . Diabetes mellitus ([place])    takes Invokamet daily as well as Hum U  . History of colon polyps   . History of kidney stones   . Hyperlipidemia    takes San[employer]I daily  . Hyperlipidemia 11/07/2013  . Hypertension    takes Benicar and Coreg daily  . ICD (implantable cardioverter-defibrillator) battery depletion   . ICD (implantable cardioverter-defibrillator) in place 11/07/2013  . Ischemic cardiomyopathy   . Ischemic cardiomyopathy   . Joint pain   . Myocardial infarction  1997   on the OR table   . Nephrolithiasis   . Obstructive sleep apnea   . Peripheral neuropathy ([place])   . Pneumonia    as a child  . Spinal stenosis   . Thrombocytopenia ([place])   . Total knee replacement status 11/29/2014  . Type 2 diabetes mellitus with stage 3 chronic kidney disease ([place]) 05/19/2015  . Urinary frequency   . Urinary urgency    Past Surgical History:  Procedure Laterality Date  . CARDIAC CATHETERIZATION N/A 10/02/2015   Procedure: Left Heart Cath and Cors/Grafts Angiography;  Surgeon: Belva Crome, MD;  Location: [facility];  Service: Cardiovascular;  Laterality: N/A;  . COLONOSCOPY    . coronary artery bypass and graft  1997   x 5  . ESOPHAGOGASTRODUODENOSCOPY    . ICD placed    . LAMINECTOMY    . LITHOTRIPSY    . Right rotator cuff surgery    . TONSILLECTOMY     adenoids  . TOTAL KNEE ARTHROPLASTY Left   . TOTAL KNEE ARTHROPLASTY Right 11/29/2014   Procedure: RIGHT TOTAL KNEE ARTHROPLASTY;  Surgeon: Leandrew Koyanagi, MD;  Location: [facility];  Service: Orthopedics;  Laterality: Right;   Social History   Social History  . Marital status: Married    Spouse name: N/A  . Number of children: N/A  . Years of education: N/A   Occupational History  .      Retired from [employer]  . Smoking status: Never Smoker  . Smokeless tobacco: Never Used  . Alcohol use 0.0 oz/week     Comment: once drink a month  . Drug use: No  . Sexual activity: Not on file   Other Topics Concern  . Not on file   Social History Narrative  . No narrative on file   Current Outpatient Prescriptions on File Prior to Visit  Medication Sig Dispense Refill  . amoxicillin (AMOXIL) 500 MG capsule Take 500 mg by mouth as directed. 6 prior to dental work    . carvedilol (COREG) 12.5 MG tablet Take 1 tablet (12.5 mg total) by mouth 2 (two) times daily with a meal. 180 tablet 3  . Coenzyme Q10 200 MG TABS Take 1 tablet by mouth daily.    . Cranberry 200 MG  CAPS Take 200 mg by mouth daily.     . empagliflozin (JARDIANCE) 25 MG TABS tablet Take 25 mg by mouth daily. 90 tablet 1  . ergocalciferol (VITAMIN D2) 50000 units capsule Take 50,000 Units by mouth once a week.    . ezetimibe (ZETIA) 10 MG tablet Take 1 tablet (10 mg total) by mouth daily. 90 tablet 3  . fenofibrate (TRICOR) 48 MG tablet Take 1 tablet (48 mg total) by mouth daily.  90 tablet 3  . furosemide (LASIX) 40 MG tablet Take 1 tablet (40 mg total) by mouth daily. 90 tablet 3  . insulin regular human CONCENTRATED (HUMULIN R) 500 UNIT/ML injection Inject 80-90 units in am, and 25-30 units before lunch and dinner 60 mL 1  . Insulin Syringe/Needle U-500 (BD INSULIN SYRINGE U-500) 31G X 6MM 0.5 ML MISC Inject 2 each into the skin 2 (two) times daily with a meal. Use 3x a day 270 each 3  . metFORMIN (GLUCOPHAGE-XR) 500 MG 24 hr tablet Take 2000 mg by mouth with dinner 360 tablet 1  . montelukast (SINGULAIR) 10 MG tablet Take 10 mg by mouth at bedtime.    . Multiple Vitamins-Minerals (CENTRUM SILVER PO) Take 1 tablet by mouth daily.    . nitroGLYCERIN (NITROSTAT) 0.4 MG SL tablet Place 1 tablet (0.4 mg total) under the tongue every 5 (five) minutes as needed for chest pain. 25 tablet 3  . NON FORMULARY Methyfolate 2000 MCG daily    . olmesartan (BENICAR) 40 MG tablet Take 1 tablet (40 mg total) by mouth 2 (two) times daily. 180 tablet 3  . omega-3 acid ethyl esters (LOVAZA) 1 g capsule Take 2 capsules (2 g total) by mouth 2 (two) times daily. 360 capsule 3  . potassium chloride (K-DUR,KLOR-CON) 10 MEQ tablet Take 2 tablets (20 mEq total) by mouth 2 (two) times daily. 360 tablet 3  . pravastatin (PRAVACHOL) 80 MG tablet Take 1 tablet (80 mg total) by mouth daily. 90 tablet 3  . Probiotic Product (PROBIOTIC DAILY PO) Take 1 tablet by mouth daily.    . rivaroxaban (XARELTO) 20 MG TABS tablet Take 1 tablet (20 mg total) by mouth daily with supper. 90 tablet 3  . spironolactone (ALDACTONE) 25 MG  tablet Take 1 tablet (25 mg total) by mouth daily. 90 tablet 3   No current facility-administered medications on file prior to visit.    Allergies  Allergen Reactions  . Adhesive [Tape]     Rash   . Iodine     flushing  . Seasonal Ic [Cholestatin]   . Contrast Media [Iodinated Diagnostic Agents] Rash and Other (See Comments)    flushing EXTREME FLUSHING    Family History  Problem Relation Age of Onset  . Heart disease      No family history   PE: BP (!) 144/88 (BP Location: Left Arm, Patient Position: Sitting)   Pulse 83   Ht 5' 11.5" (1.816 m)   Wt (!) 341 lb (154.7 kg)   SpO2 96%   BMI 46.90 kg/m  Body mass index is 46.9 kg/m. Wt Readings from Last 3 Encounters:  10/01/16 (!) 341 lb (154.7 kg)  09/17/16 (!) 339 lb (153.8 kg)  06/17/16 (!) 339 lb (153.8 kg)   Constitutional: obese, in NAD Eyes: PERRLA, EOMI, no exophthalmos ENT: moist mucous membranes, no thyromegaly, no cervical lymphadenopathy Cardiovascular: RRR, No MRG, + mild B LE swelling Respiratory: CTA B Gastrointestinal: abdomen soft, NT, ND, BS+ Musculoskeletal: no deformities, strength intact in all 4 Skin: moist, warm, no rashes Neurological: + tremor with outstretched hands, DTR normal in all 4  ASSESSMENT: 1. DM2, insulin-dependent, uncontrolled, with complications - CAD, s/p CABG 5x in 1997 (Dr Roxy Horseman), iCMP >> CHF (Dr Stanford Breed) - CKD stage 3  PLAN:  1. Patient with long-standing, uncontrolled diabetes, on oral antidiabetic regimen + U500 insulin, with improving control at last visit (HbA1c 7.3%) after adding a sliding scale of U500 and cutting down portions. However,  sugars are increased again recently 2/2 steroid tx's >> Hba1c 7.6%. - we again discussed about reducing portions and also starting exercise. Will also increase his SSI and advised him to start using it. - refilled his DM meds - I suggested to:  Patient Instructions  Please continue: - Metformin ER 2000 mg with dinner - Jardiance  25 mg in am - U500 insulin: - 80-90 units before breakfast - 25-30 units before lunch  - 25-30 units before dinner Please change the U500 sliding scale: 150-175: + 4 units 176-200: + 6 unit 201-225: + 8 units 226-300: + 10 units >301: + 12 units  Please return in 3 months with your sugar log.  - continue checking sugars at different times of the day - check 3 times a day, rotating checks - advised for yearly eye exams >> he is UTD - next scheduled for next week - Return to clinic in 3 mo with sugar log   Philemon Kingdom, MD PhD [facility] Endocrin[facility]

## 2016-10-01 NOTE — Addendum Note (Signed)
Addended by: Caprice Beaver T on: 10/01/2016 01:58 PM   Modules accepted: Orders

## 2016-10-04 NOTE — Progress Notes (Signed)
HPI: FU coronary artery disease. Patient is status post coronary artery bypassing graft in 1997. Patient had a LIMA to the LAD, saphenous vein graft to the diagonal, saphenous vein graft to the circumflex and sequential saphenous vein graft to the PDA and posterior lateral. His procedure was performed at [facility]. He subsequently moved to [facility]. Echocardiogram in 2005 showed an ejection fraction of 38%, restrictive filling and trace tricuspid regurgitation. Patient had ICD placed previously. Also with h/o atrial fibrillation. Nuclear study 12/16 showed EF 50; prior inferior MI with mild peri-infarct ischemia and moderate ischemia in the anterolateral wall. Cardiac catheterization January 2017 showed severe three-vessel coronary disease. There was total occlusion of the saphenous vein graft to the obtuse marginal, total occlusion of the saphenous vein graft to diagonal and there was a 50-70% stenosis in the sequential vein graft to the PDA and left ventricular branch and the LIMA to the LAD was patent. Ejection fraction 35-45%. Medical therapy recommended. Abdominal ultrasound August 2017 showed no aneurysm. Since last seen, he is doing reasonably well. In November he noticed increased dyspnea and pedal edema. His Lasix was increased to 80 mg daily and his symptoms have resolved. He now denies dyspnea, chest pain, palpitations, syncope or edema.  Current Outpatient Prescriptions  Medication Sig Dispense Refill  . amoxicillin (AMOXIL) 500 MG capsule Take 500 mg by mouth as directed. 6 prior to dental work    . carvedilol (COREG) 12.5 MG tablet Take 1 tablet (12.5 mg total) by mouth 2 (two) times daily with a meal. 180 tablet 3  . cholecalciferol (VITAMIN D) 1000 units tablet Take 1,000 Units by mouth daily.    . Coenzyme Q10 200 MG TABS Take 1 tablet by mouth daily.    . Cranberry 200 MG CAPS Take 200 mg by mouth daily.     . empagliflozin (JARDIANCE) 25 MG TABS tablet Take 25 mg by  mouth daily. 90 tablet 3  . ergocalciferol (VITAMIN D2) 50000 units capsule Take 50,000 Units by mouth once a week.    . ezetimibe (ZETIA) 10 MG tablet Take 1 tablet (10 mg total) by mouth daily. 90 tablet 3  . fenofibrate (TRICOR) 48 MG tablet Take 1 tablet (48 mg total) by mouth daily. 90 tablet 3  . furosemide (LASIX) 40 MG tablet Take 1 tablet (40 mg total) by mouth daily. 90 tablet 3  . insulin regular human CONCENTRATED (HUMULIN R) 500 UNIT/ML injection Inject up to 100 units in am, and up to 50 units with lunch and dinner 60 mL 3  . Insulin Syringe/Needle U-500 (BD INSULIN SYRINGE U-500) 31G X 6MM 0.5 ML MISC Inject 1 each into the skin 3 (three) times daily before meals. Use 3x a day 300 each 3  . metFORMIN (GLUCOPHAGE-XR) 500 MG 24 hr tablet Take 2000 mg by mouth with dinner 360 tablet 3  . montelukast (SINGULAIR) 10 MG tablet Take 10 mg by mouth at bedtime.    . Multiple Vitamins-Minerals (CENTRUM SILVER PO) Take 1 tablet by mouth daily.    . nitroGLYCERIN (NITROSTAT) 0.4 MG SL tablet Place 1 tablet (0.4 mg total) under the tongue every 5 (five) minutes as needed for chest pain. 25 tablet 3  . NON FORMULARY Methyfolate 2000 MCG daily    . olmesartan (BENICAR) 40 MG tablet Take 1 tablet (40 mg total) by mouth 2 (two) times daily. 180 tablet 3  . omega-3 acid ethyl esters (LOVAZA) 1 g capsule Take 2 capsules (2  g total) by mouth 2 (two) times daily. 360 capsule 3  . potassium chloride (K-DUR,KLOR-CON) 10 MEQ tablet Take 2 tablets (20 mEq total) by mouth 2 (two) times daily. 360 tablet 3  . pravastatin (PRAVACHOL) 80 MG tablet Take 1 tablet (80 mg total) by mouth daily. 90 tablet 3  . Probiotic Product (PROBIOTIC DAILY PO) Take 1 tablet by mouth daily.    . rivaroxaban (XARELTO) 20 MG TABS tablet Take 1 tablet (20 mg total) by mouth daily with supper. 90 tablet 3  . spironolactone (ALDACTONE) 25 MG tablet Take 1 tablet (25 mg total) by mouth daily. 90 tablet 3   No current  facility-administered medications for this visit.      Past Medical History:  Diagnosis Date  . Arthritis   . Atrial fibrillation ([place])    takes Xarelto daily  . CAD (coronary artery disease)   . Cardiomyopathy, ischemic 11/07/2013  . Cholelithiasis   . Chronic back pain    scoliosis/arthritis  . Complication of anesthesia    forgetful for about 2-3wks after anesthesia  . Congestive heart failure ([place])    takes Spironolactone daily  . Diabetes mellitus ([place])    takes Invokamet daily as well as Hum U  . History of colon polyps   . History of kidney stones   . Hyperlipidemia    takes [employer] daily  . Hyperlipidemia 11/07/2013  . Hypertension    takes Benicar and Coreg daily  . ICD (implantable cardioverter-defibrillator) battery depletion   . ICD (implantable cardioverter-defibrillator) in place 11/07/2013  . Ischemic cardiomyopathy   . Ischemic cardiomyopathy   . Joint pain   . Myocardial infarction 1997   on the OR table   . Nephrolithiasis   . Obstructive sleep apnea   . Peripheral neuropathy ([place])   . Pneumonia    as a child  . Spinal stenosis   . Thrombocytopenia ([place])   . Total knee replacement status 11/29/2014  . Type 2 diabetes mellitus with stage 3 chronic kidney disease ([place]) 05/19/2015  . Urinary frequency   . Urinary urgency     Past Surgical History:  Procedure Laterality Date  . CARDIAC CATHETERIZATION N/A 10/02/2015   Procedure: Left Heart Cath and Cors/Grafts Angiography;  Surgeon: Belva Crome, MD;  Location: [facility] [facility];  Service: Cardiovascular;  Laterality: N/A;  . COLONOSCOPY    . coronary artery bypass and graft  1997   x 5  . ESOPHAGOGASTRODUODENOSCOPY    . ICD placed    . LAMINECTOMY    . LITHOTRIPSY    . Right rotator cuff surgery    . TONSILLECTOMY     adenoids  . TOTAL KNEE ARTHROPLASTY Left   . TOTAL KNEE ARTHROPLASTY Right 11/29/2014   Procedure: RIGHT TOTAL KNEE ARTHROPLASTY;  Surgeon: Leandrew Koyanagi, MD;   Location: [facility];  Service: Orthopedics;  Laterality: Right;    Social History   Social History  . Marital status: Married    Spouse name: N/A  . Number of children: N/A  . Years of education: N/A   Occupational History  .      Retired from [employer]  . Smoking status: Never Smoker  . Smokeless tobacco: Never Used  . Alcohol use 0.0 oz/week     Comment: once drink a month  . Drug use: No  . Sexual activity: Not on file   Other Topics Concern  . Not on file  Social History Narrative  . No narrative on file    Family History  Problem Relation Age of Onset  . Heart disease      No family history    ROS: no fevers or chills, productive cough, hemoptysis, dysphasia, odynophagia, melena, hematochezia, dysuria, hematuria, rash, seizure activity, orthopnea, PND, pedal edema, claudication. Remaining systems are negative.  Physical Exam: Well-developed obese in no acute distress.  Skin is warm and dry.  HEENT is normal.  Neck is supple.  Chest is clear to auscultation with normal expansion.  Cardiovascular exam is irregular Abdominal exam nontender or distended. No masses palpated. Extremities show trace edema. neuro grossly intact  ECG-Atrial fibrillation at a rate of 81. Right bundle branch block.  A/P  1 atrial fibrillation-continue beta blocker and xarelto. Check hemoglobin and renal function.  2 coronary artery disease-continue statin. No aspirin given need for anticoagulation.  3 ischemic cardiomyopathy-continue ARB and beta blocker. Check potassium and renal function.  4 hypertension-blood pressure controlled. Continue present medications.  5 hyperlipidemia-continue statin. He has not tolerated high-dose statins previously. Check lipids and liver.  6 ICD-followed by electrophysiology.  7 chronic combined systolic and diastolic congestive heart failure-continue present dose of diuretics. Check potassium and renal  function.  8 morbid obesity-we discussed the importance of weight loss.   Kirk Ruths MD

## 2016-10-13 ENCOUNTER — Ambulatory Visit (INDEPENDENT_AMBULATORY_CARE_PROVIDER_SITE_OTHER): Payer: Medicare Other | Admitting: Cardiology

## 2016-10-13 ENCOUNTER — Encounter: Payer: Self-pay | Admitting: Cardiology

## 2016-10-13 VITALS — BP 136/75 | HR 86 | Ht 71.5 in | Wt 336.1 lb

## 2016-10-13 DIAGNOSIS — I10 Essential (primary) hypertension: Secondary | ICD-10-CM

## 2016-10-13 DIAGNOSIS — I25810 Atherosclerosis of coronary artery bypass graft(s) without angina pectoris: Secondary | ICD-10-CM

## 2016-10-13 DIAGNOSIS — Z95810 Presence of automatic (implantable) cardiac defibrillator: Secondary | ICD-10-CM | POA: Diagnosis not present

## 2016-10-13 DIAGNOSIS — I480 Paroxysmal atrial fibrillation: Secondary | ICD-10-CM | POA: Diagnosis not present

## 2016-10-13 NOTE — Patient Instructions (Signed)
Medication Instructions:   NO CHANGE  Labwork:  Your physician recommends that you return for lab work WHEN FASTING   Follow-Up:  Your physician wants you to follow-up in: [place] will receive a reminder letter in the mail two months in advance. If you don't receive a letter, please call our office to schedule the follow-up appointment.   If you need a refill on your cardiac medications before your next appointment, please call your pharmacy.

## 2016-10-14 DIAGNOSIS — I25810 Atherosclerosis of coronary artery bypass graft(s) without angina pectoris: Secondary | ICD-10-CM | POA: Diagnosis not present

## 2016-10-14 LAB — CBC
HEMATOCRIT: 44.5 % (ref 38.5–50.0)
Hemoglobin: 14.5 g/dL (ref 13.2–17.1)
MCH: 30.6 pg (ref 27.0–33.0)
MCHC: 32.6 g/dL (ref 32.0–36.0)
MCV: 93.9 fL (ref 80.0–100.0)
MPV: 11.8 fL (ref 7.5–12.5)
Platelets: 102 10*3/uL — ABNORMAL LOW (ref 140–400)
RBC: 4.74 MIL/uL (ref 4.20–5.80)
RDW: 16.3 % — ABNORMAL HIGH (ref 11.0–15.0)
WBC: 5.4 10*3/uL (ref 3.8–10.8)

## 2016-10-14 LAB — HEPATIC FUNCTION PANEL
ALBUMIN: 4.4 g/dL (ref 3.6–5.1)
ALK PHOS: 48 U/L (ref 40–115)
ALT: 38 U/L (ref 9–46)
AST: 33 U/L (ref 10–35)
BILIRUBIN INDIRECT: 0.5 mg/dL (ref 0.2–1.2)
Bilirubin, Direct: 0.2 mg/dL (ref <=0.2)
TOTAL PROTEIN: 6.9 g/dL (ref 6.1–8.1)
Total Bilirubin: 0.7 mg/dL (ref 0.2–1.2)

## 2016-10-14 LAB — LIPID PANEL
Cholesterol: 133 mg/dL (ref <200)
HDL: 22 mg/dL — AB (ref >40)
LDL CALC: 38 mg/dL (ref <100)
Total CHOL/HDL Ratio: 6 Ratio — ABNORMAL HIGH (ref <5.0)
Triglycerides: 367 mg/dL — ABNORMAL HIGH (ref <150)
VLDL: 73 mg/dL — ABNORMAL HIGH (ref <30)

## 2016-10-14 LAB — BASIC METABOLIC PANEL
BUN: 43 mg/dL — AB (ref 7–25)
CALCIUM: 10.3 mg/dL (ref 8.6–10.3)
CO2: 23 mmol/L (ref 20–31)
Chloride: 102 mmol/L (ref 98–110)
Creat: 1.55 mg/dL — ABNORMAL HIGH (ref 0.70–1.18)
GLUCOSE: 223 mg/dL — AB (ref 65–99)
POTASSIUM: 4.7 mmol/L (ref 3.5–5.3)
SODIUM: 140 mmol/L (ref 135–146)

## 2016-10-21 DIAGNOSIS — G4733 Obstructive sleep apnea (adult) (pediatric): Secondary | ICD-10-CM | POA: Diagnosis not present

## 2016-10-21 DIAGNOSIS — E1122 Type 2 diabetes mellitus with diabetic chronic kidney disease: Secondary | ICD-10-CM | POA: Diagnosis not present

## 2016-10-21 DIAGNOSIS — N183 Chronic kidney disease, stage 3 (moderate): Secondary | ICD-10-CM | POA: Diagnosis not present

## 2016-10-21 DIAGNOSIS — D696 Thrombocytopenia, unspecified: Secondary | ICD-10-CM | POA: Diagnosis not present

## 2016-10-21 DIAGNOSIS — E559 Vitamin D deficiency, unspecified: Secondary | ICD-10-CM | POA: Diagnosis not present

## 2016-10-21 DIAGNOSIS — Z794 Long term (current) use of insulin: Secondary | ICD-10-CM | POA: Diagnosis not present

## 2016-10-21 DIAGNOSIS — I5022 Chronic systolic (congestive) heart failure: Secondary | ICD-10-CM | POA: Diagnosis not present

## 2016-10-21 DIAGNOSIS — I2510 Atherosclerotic heart disease of native coronary artery without angina pectoris: Secondary | ICD-10-CM | POA: Diagnosis not present

## 2016-10-21 DIAGNOSIS — I10 Essential (primary) hypertension: Secondary | ICD-10-CM | POA: Diagnosis not present

## 2016-10-27 ENCOUNTER — Ambulatory Visit (HOSPITAL_BASED_OUTPATIENT_CLINIC_OR_DEPARTMENT_OTHER)
Admission: RE | Admit: 2016-10-27 | Discharge: 2016-10-27 | Disposition: A | Discharge status: Home / Self Care | Payer: Medicare Other | Source: Ambulatory Visit | Attending: Hematology & Oncology | Admitting: Hematology & Oncology

## 2016-10-27 ENCOUNTER — Other Ambulatory Visit (HOSPITAL_BASED_OUTPATIENT_CLINIC_OR_DEPARTMENT_OTHER): Payer: Medicare Other

## 2016-10-27 ENCOUNTER — Ambulatory Visit (HOSPITAL_BASED_OUTPATIENT_CLINIC_OR_DEPARTMENT_OTHER): Payer: Medicare Other | Admitting: Hematology & Oncology

## 2016-10-27 ENCOUNTER — Other Ambulatory Visit: Payer: Self-pay | Admitting: Hematology & Oncology

## 2016-10-27 VITALS — BP 154/65 | HR 77 | Temp 98.4°F | Resp 16 | Wt 336.0 lb

## 2016-10-27 DIAGNOSIS — N2889 Other specified disorders of kidney and ureter: Secondary | ICD-10-CM

## 2016-10-27 DIAGNOSIS — D696 Thrombocytopenia, unspecified: Secondary | ICD-10-CM | POA: Insufficient documentation

## 2016-10-27 DIAGNOSIS — R161 Splenomegaly, not elsewhere classified: Secondary | ICD-10-CM | POA: Diagnosis not present

## 2016-10-27 LAB — CHCC SATELLITE - SMEAR

## 2016-10-27 LAB — CBC WITH DIFFERENTIAL (CANCER CENTER ONLY)
BASO#: 0 10*3/uL (ref 0.0–0.2)
BASO%: 0.4 % (ref 0.0–2.0)
EOS ABS: 0.2 10*3/uL (ref 0.0–0.5)
EOS%: 4.6 % (ref 0.0–7.0)
HEMATOCRIT: 42.4 % (ref 38.7–49.9)
HGB: 14.1 g/dL (ref 13.0–17.1)
LYMPH#: 0.8 10*3/uL — AB (ref 0.9–3.3)
LYMPH%: 17.5 % (ref 14.0–48.0)
MCH: 31.4 pg (ref 28.0–33.4)
MCHC: 33.3 g/dL (ref 32.0–35.9)
MCV: 94 fL (ref 82–98)
MONO#: 0.5 10*3/uL (ref 0.1–0.9)
MONO%: 10.6 % (ref 0.0–13.0)
NEUT#: 3.2 10*3/uL (ref 1.5–6.5)
NEUT%: 66.9 % (ref 40.0–80.0)
Platelets: 80 10*3/uL — ABNORMAL LOW (ref 145–400)
RBC: 4.49 10*6/uL (ref 4.20–5.70)
RDW: 16.2 % — AB (ref 11.1–15.7)
WBC: 4.8 10*3/uL (ref 4.0–10.0)

## 2016-10-27 LAB — CMP (CANCER CENTER ONLY)
ALT(SGPT): 52 U/L — ABNORMAL HIGH (ref 10–47)
AST: 48 U/L — AB (ref 11–38)
Albumin: 4 g/dL (ref 3.3–5.5)
Alkaline Phosphatase: 47 U/L (ref 26–84)
BILIRUBIN TOTAL: 0.8 mg/dL (ref 0.20–1.60)
BUN, Bld: 32 mg/dL — ABNORMAL HIGH (ref 7–22)
CALCIUM: 9.7 mg/dL (ref 8.0–10.3)
CHLORIDE: 99 meq/L (ref 98–108)
CO2: 28 meq/L (ref 18–33)
Creat: 1.5 mg/dl — ABNORMAL HIGH (ref 0.6–1.2)
GLUCOSE: 232 mg/dL — AB (ref 73–118)
POTASSIUM: 4.3 meq/L (ref 3.3–4.7)
Sodium: 141 mEq/L (ref 128–145)
Total Protein: 7 g/dL (ref 6.4–8.1)

## 2016-10-27 NOTE — Progress Notes (Signed)
Hematology and Oncology Follow Up Visit  Robert Gregory [id number] 04-Oct-1940 76 y.o. 10/27/2016   Principle Diagnosis:   Chronic thrombocytopenia secondary to splenomegaly  1.57 m exophytic lesion lower pole left kidney  Current Therapy:    Observation     Interim History:  Robert Gregory is back for follow-up. We got ultrasound of his abdomen today. This clearly shows that there is some progressive splenomegaly. His splenic volume is now 1241 cm3. The renal lesion for left kidney has not grown. It measures 1.1 x 1 x 1.3 cm.  He has had no problems with bleeding or bruising. He is on Xarelto. He has an implantable defibrillator, which thankfully has not gone off.   He was out of [place] recently. He was on vacation. He enjoyed it.   He saw his cardiologist 2 weeks ago. Everything seemed to be doing okay with his heart.   Overall, his performance status is ECOG 1.  Medications:  Current Outpatient Prescriptions:  .  amoxicillin (AMOXIL) 500 MG capsule, Take 500 mg by mouth as directed. 6 prior to dental work, Disp: , Rfl:  .  carvedilol (COREG) 12.5 MG tablet, Take 1 tablet (12.5 mg total) by mouth 2 (two) times daily with a meal., Disp: 180 tablet, Rfl: 3 .  cholecalciferol (VITAMIN D) 1000 units tablet, Take 1,000 Units by mouth daily., Disp: , Rfl:  .  Coenzyme Q10 200 MG TABS, Take 1 tablet by mouth daily., Disp: , Rfl:  .  Cranberry 200 MG CAPS, Take 200 mg by mouth daily. , Disp: , Rfl:  .  empagliflozin (JARDIANCE) 25 MG TABS tablet, Take 25 mg by mouth daily., Disp: 90 tablet, Rfl: 3 .  ergocalciferol (VITAMIN D2) 50000 units capsule, Take 50,000 Units by mouth once a week., Disp: , Rfl:  .  ezetimibe (ZETIA) 10 MG tablet, Take 1 tablet (10 mg total) by mouth daily., Disp: 90 tablet, Rfl: 3 .  fenofibrate (TRICOR) 48 MG tablet, Take 1 tablet (48 mg total) by mouth daily., Disp: 90 tablet, Rfl: 3 .  furosemide (LASIX) 40 MG tablet, Take 1 tablet (40 mg total) by mouth daily.,  Disp: 90 tablet, Rfl: 3 .  insulin regular human CONCENTRATED (HUMULIN R) 500 UNIT/ML injection, Inject up to 100 units in am, and up to 50 units with lunch and dinner, Disp: 60 mL, Rfl: 3 .  Insulin Syringe/Needle U-500 (BD INSULIN SYRINGE U-500) 31G X 6MM 0.5 ML MISC, Inject 1 each into the skin 3 (three) times daily before meals. Use 3x a day, Disp: 300 each, Rfl: 3 .  metFORMIN (GLUCOPHAGE-XR) 500 MG 24 hr tablet, Take 2000 mg by mouth with dinner, Disp: 360 tablet, Rfl: 3 .  montelukast (SINGULAIR) 10 MG tablet, Take 10 mg by mouth at bedtime., Disp: , Rfl:  .  Multiple Vitamins-Minerals (CENTRUM SILVER PO), Take 1 tablet by mouth daily., Disp: , Rfl:  .  nitroGLYCERIN (NITROSTAT) 0.4 MG SL tablet, Place 1 tablet (0.4 mg total) under the tongue every 5 (five) minutes as needed for chest pain., Disp: 25 tablet, Rfl: 3 .  NON FORMULARY, Methyfolate 2000 MCG daily, Disp: , Rfl:  .  olmesartan (BENICAR) 40 MG tablet, Take 1 tablet (40 mg total) by mouth 2 (two) times daily., Disp: 180 tablet, Rfl: 3 .  omega-3 acid ethyl esters (LOVAZA) 1 g capsule, Take 2 capsules (2 g total) by mouth 2 (two) times daily., Disp: 360 capsule, Rfl: 3 .  potassium chloride (K-DUR,KLOR-CON) 10 MEQ tablet,  Take 2 tablets (20 mEq total) by mouth 2 (two) times daily., Disp: 360 tablet, Rfl: 3 .  pravastatin (PRAVACHOL) 80 MG tablet, Take 1 tablet (80 mg total) by mouth daily., Disp: 90 tablet, Rfl: 3 .  Probiotic Product (PROBIOTIC DAILY PO), Take 1 tablet by mouth daily., Disp: , Rfl:  .  rivaroxaban (XARELTO) 20 MG TABS tablet, Take 1 tablet (20 mg total) by mouth daily with supper., Disp: 90 tablet, Rfl: 3 .  spironolactone (ALDACTONE) 25 MG tablet, Take 1 tablet (25 mg total) by mouth daily., Disp: 90 tablet, Rfl: 3  Allergies:  Allergies  Allergen Reactions  . Adhesive [Tape]     Rash   . Iodine     flushing  . Seasonal Ic [Cholestatin]   . Contrast Media [Iodinated Diagnostic Agents] Rash and Other (See  Comments)    flushing EXTREME FLUSHING     Past Medical History, Surgical history, Social history, and Family History were reviewed and updated.  Review of Systems: As above  Physical Exam:  weight is 336 lb (152.4 kg) (abnormal). His oral temperature is 98.4 F (36.9 C). His blood pressure is 154/65 (abnormal) and his pulse is 77. His respiration is 16.   Wt Readings from Last 3 Encounters:  10/27/16 (!) 336 lb (152.4 kg)  10/13/16 (!) 336 lb 1.9 oz (152.5 kg)  10/01/16 (!) 341 lb (154.7 kg)     Morbidly obese white male in no obvious distress. Head and neck exam shows no ocular or oral lesions. He has no palpable cervical or supraclavicular lymph nodes. Lungs are clear. Cardiac exam irregular rate and irrhythm, consistent with atrial fibrillation. He has no murmurs, rubs or bruits. Abdomen is obese but soft. He has decent bowel sounds. There is no guarding or rebound tenderness. He has no fluid wave. There is no obvious hepatomegaly. I cannot palpate his spleen tip. Back exam shows no tenderness over the spine, ribs or hips. Extremities shows some chronic nonpitting edema in his lower legs. Skin exam shows no rashes, ecchymoses or petechia. Neurological exam shows no focal neurological deficits.  Lab Results  Component Value Date   WBC 4.8 10/27/2016   HGB 14.1 10/27/2016   HCT 42.4 10/27/2016   MCV 94 10/27/2016   PLT 80 Platelet count consistent in citrate (L) 10/27/2016     Chemistry      Component Value Date/Time   NA 140 10/13/2016 0853   NA 141 04/21/2016 0852   K 4.7 10/13/2016 0853   K 4.4 04/21/2016 0852   CL 102 10/13/2016 0853   CO2 23 10/13/2016 0853   CO2 22 04/21/2016 0852   BUN 43 (H) 10/13/2016 0853   BUN 29.0 (H) 04/21/2016 0852   CREATININE 1.55 (H) 10/13/2016 0853   CREATININE 1.6 (H) 04/21/2016 0852      Component Value Date/Time   CALCIUM 10.3 10/13/2016 0853   CALCIUM 10.2 04/21/2016 0852   ALKPHOS 48 10/13/2016 0853   ALKPHOS 51 04/21/2016  0852   AST 33 10/13/2016 0853   AST 39 (H) 04/21/2016 0852   ALT 38 10/13/2016 0853   ALT 46 04/21/2016 0852   BILITOT 0.7 10/13/2016 0853   BILITOT 0.71 04/21/2016 0852         Impression and Plan: Robert Gregory is a 76 year old white male. He has moderate thrombocytopenia. His platelet count is holding fairly steady right now.  I think that he probably has some splenic sequestration. He also may have some medication induced thrombocytopenia. However,  he needs to be on his medications.  I feel good about the renal lesion. This certainly is not growing. I think that we can just follow this.  Clearly, his spleen seems to be enlarging. I would not think that this is anything from a hematologic point of view but this is a possibility. As such, if his spleen continues to enlarge, we may have to consider a bone marrow test to make sure that there is no underlying hematologic malignancy that might be causing the progressive splenomegaly.    I will like to see him back in 4 months. I will get an ultrasound when we see him back.  [name], MD 2/7/201811:39 AM

## 2016-11-08 ENCOUNTER — Other Ambulatory Visit: Payer: Self-pay

## 2016-11-08 MED ORDER — NITROGLYCERIN 0.4 MG SL SUBL: 0.4000 mg | SUBLINGUAL_TABLET | SUBLINGUAL | 3 refills | Status: DC | PRN

## 2016-11-12 ENCOUNTER — Encounter: Payer: Self-pay | Admitting: *Deleted

## 2016-11-15 DIAGNOSIS — M9905 Segmental and somatic dysfunction of pelvic region: Secondary | ICD-10-CM | POA: Diagnosis not present

## 2016-11-15 DIAGNOSIS — M5117 Intervertebral disc disorders with radiculopathy, lumbosacral region: Secondary | ICD-10-CM | POA: Diagnosis not present

## 2016-11-15 DIAGNOSIS — M9903 Segmental and somatic dysfunction of lumbar region: Secondary | ICD-10-CM | POA: Diagnosis not present

## 2016-11-15 DIAGNOSIS — M5137 Other intervertebral disc degeneration, lumbosacral region: Secondary | ICD-10-CM | POA: Diagnosis not present

## 2016-11-16 DIAGNOSIS — M5117 Intervertebral disc disorders with radiculopathy, lumbosacral region: Secondary | ICD-10-CM | POA: Diagnosis not present

## 2016-11-16 DIAGNOSIS — M5137 Other intervertebral disc degeneration, lumbosacral region: Secondary | ICD-10-CM | POA: Diagnosis not present

## 2016-11-16 DIAGNOSIS — M9903 Segmental and somatic dysfunction of lumbar region: Secondary | ICD-10-CM | POA: Diagnosis not present

## 2016-11-16 DIAGNOSIS — L570 Actinic keratosis: Secondary | ICD-10-CM | POA: Diagnosis not present

## 2016-11-16 DIAGNOSIS — L309 Dermatitis, unspecified: Secondary | ICD-10-CM | POA: Diagnosis not present

## 2016-11-16 DIAGNOSIS — M9905 Segmental and somatic dysfunction of pelvic region: Secondary | ICD-10-CM | POA: Diagnosis not present

## 2016-11-17 DIAGNOSIS — M9903 Segmental and somatic dysfunction of lumbar region: Secondary | ICD-10-CM | POA: Diagnosis not present

## 2016-11-17 DIAGNOSIS — M5137 Other intervertebral disc degeneration, lumbosacral region: Secondary | ICD-10-CM | POA: Diagnosis not present

## 2016-11-17 DIAGNOSIS — M9905 Segmental and somatic dysfunction of pelvic region: Secondary | ICD-10-CM | POA: Diagnosis not present

## 2016-11-17 DIAGNOSIS — M5117 Intervertebral disc disorders with radiculopathy, lumbosacral region: Secondary | ICD-10-CM | POA: Diagnosis not present

## 2016-11-18 DIAGNOSIS — M5117 Intervertebral disc disorders with radiculopathy, lumbosacral region: Secondary | ICD-10-CM | POA: Diagnosis not present

## 2016-11-18 DIAGNOSIS — M5137 Other intervertebral disc degeneration, lumbosacral region: Secondary | ICD-10-CM | POA: Diagnosis not present

## 2016-11-18 DIAGNOSIS — M9903 Segmental and somatic dysfunction of lumbar region: Secondary | ICD-10-CM | POA: Diagnosis not present

## 2016-11-18 DIAGNOSIS — M9905 Segmental and somatic dysfunction of pelvic region: Secondary | ICD-10-CM | POA: Diagnosis not present

## 2016-11-22 ENCOUNTER — Ambulatory Visit (INDEPENDENT_AMBULATORY_CARE_PROVIDER_SITE_OTHER): Payer: Medicare Other | Admitting: Internal Medicine

## 2016-11-22 ENCOUNTER — Encounter: Payer: Self-pay | Admitting: Internal Medicine

## 2016-11-22 VITALS — BP 132/68 | HR 75 | Ht 73.0 in | Wt 339.4 lb

## 2016-11-22 DIAGNOSIS — R9439 Abnormal result of other cardiovascular function study: Secondary | ICD-10-CM

## 2016-11-22 DIAGNOSIS — I5022 Chronic systolic (congestive) heart failure: Secondary | ICD-10-CM

## 2016-11-22 DIAGNOSIS — I25810 Atherosclerosis of coronary artery bypass graft(s) without angina pectoris: Secondary | ICD-10-CM | POA: Diagnosis not present

## 2016-11-22 LAB — CUP PACEART INCLINIC DEVICE CHECK
Date Time Interrogation Session: 20180305165509
HIGH POWER IMPEDANCE MEASURED VALUE: 83.25 Ohm
Implantable Lead Implant Date: 20140320
Implantable Lead Location: 753859
Implantable Pulse Generator Implant Date: 20140320
Lead Channel Impedance Value: 387.5 Ohm
Lead Channel Pacing Threshold Amplitude: 0.75 V
Lead Channel Pacing Threshold Amplitude: 0.75 V
Lead Channel Pacing Threshold Pulse Width: 0.5 ms
Lead Channel Pacing Threshold Pulse Width: 0.5 ms
Lead Channel Setting Sensing Sensitivity: 0.5 mV
MDC IDC LEAD IMPLANT DT: 20140320
MDC IDC LEAD LOCATION: 753860
MDC IDC MSMT LEADCHNL RA SENSING INTR AMPL: 2.7 mV
MDC IDC MSMT LEADCHNL RV IMPEDANCE VALUE: 412.5 Ohm
MDC IDC MSMT LEADCHNL RV SENSING INTR AMPL: 12 mV
MDC IDC PG SERIAL: 7067730
MDC IDC SET LEADCHNL RV PACING AMPLITUDE: 2 V
MDC IDC SET LEADCHNL RV PACING PULSEWIDTH: 0.5 ms
MDC IDC STAT BRADY RA PERCENT PACED: 0 %
MDC IDC STAT BRADY RV PERCENT PACED: 0.03 %

## 2016-11-22 NOTE — Patient Instructions (Addendum)
Medication Instructions:  Your physician recommends that you continue on your current medications as directed. Please refer to the Current Medication list given to you today.   Labwork: None Ordered   Testing/Procedures: None Ordered   Follow-Up: Your physician wants you to follow-up in: 1 year with [name]. You will receive a reminder letter in the mail two months in advance. If you don't receive a letter, please call our office to schedule the follow-up appointment.  Remote monitoring is used to monitor your ICD from home. This monitoring reduces the number of office visits required to check your device to one time per year. It allows [place] to keep an eye on the functioning of your device to ensure it is working properly. You are scheduled for a device check from home on 02/21/17. You may send your transmission at any time that day. If you have a wireless device, the transmission will be sent automatically. After your physician reviews your transmission, you will receive a postcard with your next transmission date.    Any Other Special Instructions Will Be Listed Below (If Applicable).     If you need a refill on your cardiac medications before your next appointment, please call your pharmacy.

## 2016-11-22 NOTE — Progress Notes (Signed)
HPI Robert Gregory returns today for ongoing management of his ICD. He is a pleasant 77 yo man with morbid obesity, who has a h/o CAD and underwent CABG. He has a h/o atrial fibrillation. He has chronic systolic heart failure with an EF of 35%. He has DM.  His heart failure symptoms are class II, although he is quite sedentary. No ICD shocks. He has traveled to [place] to visit friends. Allergies  Allergen Reactions  . Adhesive [Tape]     Rash   . Iodine     flushing  . Seasonal Ic [Cholestatin]   . Contrast Media [Iodinated Diagnostic Agents] Rash and Other (See Comments)    flushing EXTREME FLUSHING      Current Outpatient Prescriptions  Medication Sig Dispense Refill  . amoxicillin (AMOXIL) 500 MG capsule Take 500 mg by mouth as directed. 6 prior to dental work    . carvedilol (COREG) 12.5 MG tablet Take 1 tablet (12.5 mg total) by mouth 2 (two) times daily with a meal. 180 tablet 3  . cholecalciferol (VITAMIN D) 1000 units tablet Take 1,000 Units by mouth daily.    . Coenzyme Q10 200 MG TABS Take 1 tablet by mouth daily.    . Cranberry 200 MG CAPS Take 200 mg by mouth daily.     . empagliflozin (JARDIANCE) 25 MG TABS tablet Take 25 mg by mouth daily. 90 tablet 3  . ergocalciferol (VITAMIN D2) 50000 units capsule Take 50,000 Units by mouth once a week.    . ezetimibe (ZETIA) 10 MG tablet Take 1 tablet (10 mg total) by mouth daily. 90 tablet 3  . fenofibrate (TRICOR) 48 MG tablet Take 1 tablet (48 mg total) by mouth daily. 90 tablet 3  . furosemide (LASIX) 40 MG tablet Take 1 tablet (40 mg total) by mouth daily. 90 tablet 3  . insulin regular human CONCENTRATED (HUMULIN R) 500 UNIT/ML injection Inject up to 100 units in am, and up to 50 units with lunch and dinner 60 mL 3  . Insulin Syringe/Needle U-500 (BD INSULIN SYRINGE U-500) 31G X 6MM 0.5 ML MISC Inject 1 each into the skin 3 (three) times daily before meals. Use 3x a day 300 each 3  . metFORMIN (GLUCOPHAGE-XR) 500 MG 24 hr  tablet Take 2000 mg by mouth with dinner 360 tablet 3  . montelukast (SINGULAIR) 10 MG tablet Take 10 mg by mouth at bedtime.    . Multiple Vitamins-Minerals (CENTRUM SILVER PO) Take 1 tablet by mouth daily.    . nitroGLYCERIN (NITROSTAT) 0.4 MG SL tablet Place 1 tablet (0.4 mg total) under the tongue every 5 (five) minutes as needed for chest pain. 25 tablet 3  . NON FORMULARY Methyfolate 2000 MCG daily    . olmesartan (BENICAR) 40 MG tablet Take 1 tablet (40 mg total) by mouth 2 (two) times daily. 180 tablet 3  . omega-3 acid ethyl esters (LOVAZA) 1 g capsule Take 2 capsules (2 g total) by mouth 2 (two) times daily. 360 capsule 3  . omeprazole (PRILOSEC) 20 MG capsule Take 1 capsule by mouth daily.    . potassium chloride (K-DUR,KLOR-CON) 10 MEQ tablet Take 2 tablets (20 mEq total) by mouth 2 (two) times daily. 360 tablet 3  . pravastatin (PRAVACHOL) 80 MG tablet Take 1 tablet (80 mg total) by mouth daily. 90 tablet 3  . Probiotic Product (PROBIOTIC DAILY PO) Take 1 tablet by mouth daily.    . rivaroxaban (XARELTO) 20 MG TABS  tablet Take 1 tablet (20 mg total) by mouth daily with supper. 90 tablet 3  . spironolactone (ALDACTONE) 25 MG tablet Take 1 tablet (25 mg total) by mouth daily. 90 tablet 3   No current facility-administered medications for this visit.      Past Medical History:  Diagnosis Date  . Arthritis   . Atrial fibrillation ([place])    takes Xarelto daily  . CAD (coronary artery disease)   . Cardiomyopathy, ischemic 11/07/2013  . Cholelithiasis   . Chronic back pain    scoliosis/arthritis  . Complication of anesthesia    forgetful for about 2-3wks after anesthesia  . Congestive heart failure ([place])    takes Spironolactone daily  . Diabetes mellitus ([place])    takes Invokamet daily as well as Hum U  . History of colon polyps   . History of kidney stones   . Hyperlipidemia    takes [employer] daily  . Hyperlipidemia 11/07/2013  . Hypertension    takes  Benicar and Coreg daily  . ICD (implantable cardioverter-defibrillator) battery depletion   . ICD (implantable cardioverter-defibrillator) in place 11/07/2013  . Ischemic cardiomyopathy   . Ischemic cardiomyopathy   . Joint pain   . Myocardial infarction 1997   on the OR table   . Nephrolithiasis   . Obstructive sleep apnea   . Peripheral neuropathy ([place])   . Pneumonia    as a child  . Spinal stenosis   . Thrombocytopenia ([place])   . Total knee replacement status 11/29/2014  . Type 2 diabetes mellitus with stage 3 chronic kidney disease ([place]) 05/19/2015  . Urinary frequency   . Urinary urgency     ROS:   All systems reviewed and negative except as noted in the HPI.   Past Surgical History:  Procedure Laterality Date  . CARDIAC CATHETERIZATION N/A 10/02/2015   Procedure: Left Heart Cath and Cors/Grafts Angiography;  Surgeon: Belva Crome, MD;  Location: [facility];  Service: Cardiovascular;  Laterality: N/A;  . COLONOSCOPY    . coronary artery bypass and graft  1997   x 5  . ESOPHAGOGASTRODUODENOSCOPY    . ICD placed    . LAMINECTOMY    . LITHOTRIPSY    . Right rotator cuff surgery    . TONSILLECTOMY     adenoids  . TOTAL KNEE ARTHROPLASTY Left   . TOTAL KNEE ARTHROPLASTY Right 11/29/2014   Procedure: RIGHT TOTAL KNEE ARTHROPLASTY;  Surgeon: Leandrew Koyanagi, MD;  Location: [facility];  Service: Orthopedics;  Laterality: Right;     Family History  Problem Relation Age of Onset  . Heart disease      No family history     Social History   Social History  . Marital status: Married    Spouse name: N/A  . Number of children: N/A  . Years of education: N/A   Occupational History  .      Retired from [employer]  . Smoking status: Never Smoker  . Smokeless tobacco: Never Used  . Alcohol use 0.0 oz/week     Comment: once drink a month  . Drug use: No  . Sexual activity: Not on file   Other Topics Concern  . Not on file   Social  History Narrative  . No narrative on file     BP 132/68   Pulse 75   Ht 6' 1"  (1.854 m)   Wt (!) 339 lb 6.4 oz (  154 kg)   SpO2 96%   BMI 44.78 kg/m   Physical Exam:  stable appearing 76 yo obese man, NAD HEENT: Unremarkable Neck:  No JVD, no thyromegally Back:  No CVA tenderness Lungs:  Clear with no wheezes, well-healed ICD incision. HEART:  Regular rate rhythm, no murmurs, no rubs, no clicks Abd:  soft, obese, positive bowel sounds, no organomegally, no rebound, no guarding Ext:  2 plus pulses, no edema, no cyanosis, no clubbing Skin:  No rashes no nodules Neuro:  CN II through XII intact, motor grossly intact   DEVICE  Normal device function.  See PaceArt for details.   Assess/Plan: 1. Chronic systolic heart failure -his symptoms remain class 2. He will continue his current meds. He is encouraged to reduce his weight and sodium intake.  2. HTN - his blood pressure is fairly well controlled. See above. 3. CAD - he denies anginal symptoms. No change in meds. Note results of last cardiac cath several weeks ago 4. ICD - is [place] ICD is working normally. Will recheck in several months. Robert Gregory.D.

## 2016-11-23 DIAGNOSIS — M5117 Intervertebral disc disorders with radiculopathy, lumbosacral region: Secondary | ICD-10-CM | POA: Diagnosis not present

## 2016-11-23 DIAGNOSIS — M9903 Segmental and somatic dysfunction of lumbar region: Secondary | ICD-10-CM | POA: Diagnosis not present

## 2016-11-23 DIAGNOSIS — M5137 Other intervertebral disc degeneration, lumbosacral region: Secondary | ICD-10-CM | POA: Diagnosis not present

## 2016-11-23 DIAGNOSIS — M9905 Segmental and somatic dysfunction of pelvic region: Secondary | ICD-10-CM | POA: Diagnosis not present

## 2016-11-24 DIAGNOSIS — M5117 Intervertebral disc disorders with radiculopathy, lumbosacral region: Secondary | ICD-10-CM | POA: Diagnosis not present

## 2016-11-24 DIAGNOSIS — M9903 Segmental and somatic dysfunction of lumbar region: Secondary | ICD-10-CM | POA: Diagnosis not present

## 2016-11-24 DIAGNOSIS — M9905 Segmental and somatic dysfunction of pelvic region: Secondary | ICD-10-CM | POA: Diagnosis not present

## 2016-11-24 DIAGNOSIS — M5137 Other intervertebral disc degeneration, lumbosacral region: Secondary | ICD-10-CM | POA: Diagnosis not present

## 2016-11-25 DIAGNOSIS — M9905 Segmental and somatic dysfunction of pelvic region: Secondary | ICD-10-CM | POA: Diagnosis not present

## 2016-11-25 DIAGNOSIS — M5137 Other intervertebral disc degeneration, lumbosacral region: Secondary | ICD-10-CM | POA: Diagnosis not present

## 2016-11-25 DIAGNOSIS — M5117 Intervertebral disc disorders with radiculopathy, lumbosacral region: Secondary | ICD-10-CM | POA: Diagnosis not present

## 2016-11-25 DIAGNOSIS — M9903 Segmental and somatic dysfunction of lumbar region: Secondary | ICD-10-CM | POA: Diagnosis not present

## 2016-11-30 DIAGNOSIS — M9903 Segmental and somatic dysfunction of lumbar region: Secondary | ICD-10-CM | POA: Diagnosis not present

## 2016-11-30 DIAGNOSIS — M9905 Segmental and somatic dysfunction of pelvic region: Secondary | ICD-10-CM | POA: Diagnosis not present

## 2016-11-30 DIAGNOSIS — M5137 Other intervertebral disc degeneration, lumbosacral region: Secondary | ICD-10-CM | POA: Diagnosis not present

## 2016-11-30 DIAGNOSIS — M5117 Intervertebral disc disorders with radiculopathy, lumbosacral region: Secondary | ICD-10-CM | POA: Diagnosis not present

## 2016-12-01 DIAGNOSIS — M5137 Other intervertebral disc degeneration, lumbosacral region: Secondary | ICD-10-CM | POA: Diagnosis not present

## 2016-12-01 DIAGNOSIS — M9903 Segmental and somatic dysfunction of lumbar region: Secondary | ICD-10-CM | POA: Diagnosis not present

## 2016-12-01 DIAGNOSIS — M5117 Intervertebral disc disorders with radiculopathy, lumbosacral region: Secondary | ICD-10-CM | POA: Diagnosis not present

## 2016-12-01 DIAGNOSIS — M9905 Segmental and somatic dysfunction of pelvic region: Secondary | ICD-10-CM | POA: Diagnosis not present

## 2016-12-02 DIAGNOSIS — M5137 Other intervertebral disc degeneration, lumbosacral region: Secondary | ICD-10-CM | POA: Diagnosis not present

## 2016-12-02 DIAGNOSIS — M9905 Segmental and somatic dysfunction of pelvic region: Secondary | ICD-10-CM | POA: Diagnosis not present

## 2016-12-02 DIAGNOSIS — M5117 Intervertebral disc disorders with radiculopathy, lumbosacral region: Secondary | ICD-10-CM | POA: Diagnosis not present

## 2016-12-02 DIAGNOSIS — M9903 Segmental and somatic dysfunction of lumbar region: Secondary | ICD-10-CM | POA: Diagnosis not present

## 2016-12-07 DIAGNOSIS — M5137 Other intervertebral disc degeneration, lumbosacral region: Secondary | ICD-10-CM | POA: Diagnosis not present

## 2016-12-07 DIAGNOSIS — M5117 Intervertebral disc disorders with radiculopathy, lumbosacral region: Secondary | ICD-10-CM | POA: Diagnosis not present

## 2016-12-07 DIAGNOSIS — M9903 Segmental and somatic dysfunction of lumbar region: Secondary | ICD-10-CM | POA: Diagnosis not present

## 2016-12-07 DIAGNOSIS — M9905 Segmental and somatic dysfunction of pelvic region: Secondary | ICD-10-CM | POA: Diagnosis not present

## 2016-12-08 DIAGNOSIS — M5117 Intervertebral disc disorders with radiculopathy, lumbosacral region: Secondary | ICD-10-CM | POA: Diagnosis not present

## 2016-12-08 DIAGNOSIS — M5137 Other intervertebral disc degeneration, lumbosacral region: Secondary | ICD-10-CM | POA: Diagnosis not present

## 2016-12-08 DIAGNOSIS — M9903 Segmental and somatic dysfunction of lumbar region: Secondary | ICD-10-CM | POA: Diagnosis not present

## 2016-12-08 DIAGNOSIS — M9905 Segmental and somatic dysfunction of pelvic region: Secondary | ICD-10-CM | POA: Diagnosis not present

## 2016-12-09 DIAGNOSIS — M9903 Segmental and somatic dysfunction of lumbar region: Secondary | ICD-10-CM | POA: Diagnosis not present

## 2016-12-09 DIAGNOSIS — M9905 Segmental and somatic dysfunction of pelvic region: Secondary | ICD-10-CM | POA: Diagnosis not present

## 2016-12-09 DIAGNOSIS — M5137 Other intervertebral disc degeneration, lumbosacral region: Secondary | ICD-10-CM | POA: Diagnosis not present

## 2016-12-09 DIAGNOSIS — M5117 Intervertebral disc disorders with radiculopathy, lumbosacral region: Secondary | ICD-10-CM | POA: Diagnosis not present

## 2016-12-15 DIAGNOSIS — H25813 Combined forms of age-related cataract, bilateral: Secondary | ICD-10-CM | POA: Diagnosis not present

## 2016-12-15 DIAGNOSIS — H527 Unspecified disorder of refraction: Secondary | ICD-10-CM | POA: Diagnosis not present

## 2016-12-15 DIAGNOSIS — H40003 Preglaucoma, unspecified, bilateral: Secondary | ICD-10-CM | POA: Diagnosis not present

## 2016-12-15 DIAGNOSIS — H02834 Dermatochalasis of left upper eyelid: Secondary | ICD-10-CM | POA: Diagnosis not present

## 2016-12-15 DIAGNOSIS — E119 Type 2 diabetes mellitus without complications: Secondary | ICD-10-CM | POA: Diagnosis not present

## 2016-12-20 DIAGNOSIS — D696 Thrombocytopenia, unspecified: Secondary | ICD-10-CM | POA: Diagnosis not present

## 2016-12-20 DIAGNOSIS — Z1389 Encounter for screening for other disorder: Secondary | ICD-10-CM | POA: Diagnosis not present

## 2016-12-20 DIAGNOSIS — E7800 Pure hypercholesterolemia, unspecified: Secondary | ICD-10-CM | POA: Diagnosis not present

## 2016-12-20 DIAGNOSIS — N183 Chronic kidney disease, stage 3 (moderate): Secondary | ICD-10-CM | POA: Diagnosis not present

## 2016-12-20 DIAGNOSIS — G4733 Obstructive sleep apnea (adult) (pediatric): Secondary | ICD-10-CM | POA: Diagnosis not present

## 2016-12-20 DIAGNOSIS — E559 Vitamin D deficiency, unspecified: Secondary | ICD-10-CM | POA: Diagnosis not present

## 2016-12-20 DIAGNOSIS — Z794 Long term (current) use of insulin: Secondary | ICD-10-CM | POA: Diagnosis not present

## 2016-12-20 DIAGNOSIS — I5022 Chronic systolic (congestive) heart failure: Secondary | ICD-10-CM | POA: Diagnosis not present

## 2016-12-20 DIAGNOSIS — Z0000 Encounter for general adult medical examination without abnormal findings: Secondary | ICD-10-CM | POA: Diagnosis not present

## 2016-12-20 DIAGNOSIS — E1122 Type 2 diabetes mellitus with diabetic chronic kidney disease: Secondary | ICD-10-CM | POA: Diagnosis not present

## 2016-12-20 DIAGNOSIS — Z125 Encounter for screening for malignant neoplasm of prostate: Secondary | ICD-10-CM | POA: Diagnosis not present

## 2016-12-20 DIAGNOSIS — I10 Essential (primary) hypertension: Secondary | ICD-10-CM | POA: Diagnosis not present

## 2016-12-20 DIAGNOSIS — I2510 Atherosclerotic heart disease of native coronary artery without angina pectoris: Secondary | ICD-10-CM | POA: Diagnosis not present

## 2016-12-30 ENCOUNTER — Ambulatory Visit: Payer: Medicare Other | Admitting: Internal Medicine

## 2017-01-10 DIAGNOSIS — L570 Actinic keratosis: Secondary | ICD-10-CM | POA: Diagnosis not present

## 2017-01-10 DIAGNOSIS — L820 Inflamed seborrheic keratosis: Secondary | ICD-10-CM | POA: Diagnosis not present

## 2017-01-12 ENCOUNTER — Encounter: Payer: Self-pay | Admitting: Internal Medicine

## 2017-01-12 ENCOUNTER — Ambulatory Visit (INDEPENDENT_AMBULATORY_CARE_PROVIDER_SITE_OTHER): Payer: Medicare Other | Admitting: Internal Medicine

## 2017-01-12 VITALS — BP 134/80 | HR 82 | Ht 72.0 in | Wt 335.0 lb

## 2017-01-12 DIAGNOSIS — N183 Chronic kidney disease, stage 3 (moderate): Secondary | ICD-10-CM | POA: Diagnosis not present

## 2017-01-12 DIAGNOSIS — E1122 Type 2 diabetes mellitus with diabetic chronic kidney disease: Secondary | ICD-10-CM | POA: Diagnosis not present

## 2017-01-12 DIAGNOSIS — I25810 Atherosclerosis of coronary artery bypass graft(s) without angina pectoris: Secondary | ICD-10-CM

## 2017-01-12 DIAGNOSIS — Z794 Long term (current) use of insulin: Secondary | ICD-10-CM | POA: Diagnosis not present

## 2017-01-12 LAB — POCT GLYCOSYLATED HEMOGLOBIN (HGB A1C): HEMOGLOBIN A1C: 7.6

## 2017-01-12 MED ORDER — ALBIGLUTIDE 30 MG ~~LOC~~ PEN: PEN_INJECTOR | SUBCUTANEOUS | 5 refills | Status: DC

## 2017-01-12 NOTE — Progress Notes (Signed)
Patient ID: Robert Gregory, male   DOB: [birth year], 76 y.o.   MRN: [id number]  HPI: Robert Gregory is a 76 y.o.-year-old male, returning for f/u for DM2, dx in 2004, insulin-dependent, uncontrolled, with complications (CAD - Ischemic CMP, s/p CABG x5 in 1997, CHF; CKD stage 3). Last visit 3 mo ago.  Last hemoglobin A1c was: Lab Results  Component Value Date   HGBA1C 6.1 10/01/2016   HGBA1C 7.3 06/17/2016   HGBA1C 7.6 02/24/2016   Pt is on a regimen of: - Metformin ER 2000 mg with dinner - Jardiance 25 mg in am - U500 insulin: - 80-90 units before breakfast - 25-30 units before lunch  - 25-30 units before dinner - U500 sliding scale:  150-175: + 2 units 176-200: + 3 units 201-225: + 4 units 226-300: + 5 units 301-325: + 6 units >325: + 7 units Stopped Cinnamon 500 mg 2x a day He gained 50 lbs with Actos in the past. He was on Invokana 100 mg in am  Pt checks his sugars 3x a day: - am:119, 135-257, 281, 300 >> 157-201 >> 144-190, 208 >> 131-208 >> 165-217 - 2h after b'fast: n/c >> 141-155 >> 191 >> n/c - before lunch: n/c >> 110-120 >> 130-309 >> 191-273 >> 151, 157 >> 173-269 >> n/c - 2h after lunch: 308, 321  (mex meal) >>  213-272 >> 160-226, 300 >> 161, 205 - before dinner:  75, 96, 106-172, 225 >>73, 93-187, 209 >> 119, 126-207, 243 - 2h after dinner:  136-245, 302 (mex meal) >> 108, 134-164, 308 >> 97-283, 318 >> 131-238 - bedtime:  111-201 >> 140-196 >> 130-165, 310 >> 105-164, 257 >> see above - nighttime: n/c >> 84-226 >> 80x2 >> n/c >> 144 >> 164, 176 >> 110-175, 246 >> 144, 180 No lows. Lowest sugar was 75 >> 93 >> 119; he has hypoglycemia awareness at 70.  Highest sugar was 308 >> 238.  Glucometer: [employer] 2  Pt's meals are: - Breakfast: cereals + 2% milk, OJ ; sausage + eggs + toast - Lunch: soup + sandwich (lightest) - Dinner: meat + veggies + starch  - Snacks: 1- pm: peanuts, V8 juice  - + mild CKD, last BUN/creatinine:  Lab Results  Component Value  Date   BUN 32 (H) 10/27/2016   CREATININE 1.5 (H) 10/27/2016  On Benicar 80 mg daily. - last set of lipids: Lab Results  Component Value Date   CHOL 133 10/13/2016   HDL 22 (L) 10/13/2016   LDLCALC 38 10/13/2016   TRIG 367 (H) 10/13/2016   CHOLHDL 6.0 (H) 10/13/2016  On Pravachol 80 mg daily. - last eye exam was in 12/2016. No DR. - no numbness and tingling in his feet.  ROS: Constitutional: no weight gain/no weight loss, no fatigue, no subjective hyperthermia, no subjective hypothermia Eyes: no blurry vision, no xerophthalmia ENT: no sore throat, no nodules palpated in throat, no dysphagia, no odynophagia, no hoarseness Cardiovascular: no CP/no SOB/no palpitations/no leg swelling Respiratory: no cough/no SOB Gastrointestinal: no N/no V/no D/no C Musculoskeletal: no muscle aches/no joint aches Skin: no rashes Neurological: no tremors/no numbness/no tingling/no dizziness  I reviewed pt's medications, allergies, PMH, social hx, family hx, and changes were documented in the history of present illness. Otherwise, unchanged from my initial visit note.  Past Medical History:  Diagnosis Date  . Arthritis   . Atrial fibrillation ([place])    takes Xarelto daily  . CAD (coronary artery disease)   .  Cardiomyopathy, ischemic 11/07/2013  . Cholelithiasis   . Chronic back pain    scoliosis/arthritis  . Complication of anesthesia    forgetful for about 2-3wks after anesthesia  . Congestive heart failure ([place])    takes Spironolactone daily  . Diabetes mellitus ([place])    takes Invokamet daily as well as Hum U  . History of colon polyps   . History of kidney stones   . Hyperlipidemia    takes San[employer]-SCI daily  . Hyperlipidemia 11/07/2013  . Hypertension    takes Benicar and Coreg daily  . ICD (implantable cardioverter-defibrillator) battery depletion   . ICD (implantable cardioverter-defibrillator) in place 11/07/2013  . Ischemic cardiomyopathy   . Ischemic  cardiomyopathy   . Joint pain   . Myocardial infarction ([place]) 1997   on the OR table   . Nephrolithiasis   . Obstructive sleep apnea   . Peripheral neuropathy   . Pneumonia    as a child  . Spinal stenosis   . Thrombocytopenia ([place])   . Total knee replacement status 11/29/2014  . Type 2 diabetes mellitus with stage 3 chronic kidney disease ([place]) 05/19/2015  . Urinary frequency   . Urinary urgency    Past Surgical History:  Procedure Laterality Date  . CARDIAC CATHETERIZATION N/A 10/02/2015   Procedure: Left Heart Cath and Cors/Grafts Angiography;  Surgeon: Belva Crome, MD;  Location: [facility];  Service: Cardiovascular;  Laterality: N/A;  . COLONOSCOPY    . coronary artery bypass and graft  1997   x 5  . ESOPHAGOGASTRODUODENOSCOPY    . ICD placed    . LAMINECTOMY    . LITHOTRIPSY    . Right rotator cuff surgery    . TONSILLECTOMY     adenoids  . TOTAL KNEE ARTHROPLASTY Left   . TOTAL KNEE ARTHROPLASTY Right 11/29/2014   Procedure: RIGHT TOTAL KNEE ARTHROPLASTY;  Surgeon: Leandrew Koyanagi, MD;  Location: [facility];  Service: Orthopedics;  Laterality: Right;   Social History   Social History  . Marital status: Married    Spouse name: N/A  . Number of children: N/A  . Years of education: N/A   Occupational History  .      Retired from [employer]  . Smoking status: Never Smoker  . Smokeless tobacco: Never Used  . Alcohol use 0.0 oz/week     Comment: once drink a month  . Drug use: No  . Sexual activity: Not on file   Other Topics Concern  . Not on file   Social History Narrative  . No narrative on file   Current Outpatient Prescriptions on File Prior to Visit  Medication Sig Dispense Refill  . amoxicillin (AMOXIL) 500 MG capsule Take 500 mg by mouth as directed. 6 prior to dental work    . carvedilol (COREG) 12.5 MG tablet Take 1 tablet (12.5 mg total) by mouth 2 (two) times daily with a meal. 180 tablet 3  . cholecalciferol  (VITAMIN D) 1000 units tablet Take 1,000 Units by mouth daily.    . Coenzyme Q10 200 MG TABS Take 1 tablet by mouth daily.    . Cranberry 200 MG CAPS Take 200 mg by mouth daily.     . empagliflozin (JARDIANCE) 25 MG TABS tablet Take 25 mg by mouth daily. 90 tablet 3  . ergocalciferol (VITAMIN D2) 50000 units capsule Take 50,000 Units by mouth once a week.    . ezetimibe (ZETIA)  10 MG tablet Take 1 tablet (10 mg total) by mouth daily. 90 tablet 3  . fenofibrate (TRICOR) 48 MG tablet Take 1 tablet (48 mg total) by mouth daily. 90 tablet 3  . furosemide (LASIX) 40 MG tablet Take 1 tablet (40 mg total) by mouth daily. 90 tablet 3  . insulin regular human CONCENTRATED (HUMULIN R) 500 UNIT/ML injection Inject up to 100 units in am, and up to 50 units with lunch and dinner 60 mL 3  . Insulin Syringe/Needle U-500 (BD INSULIN SYRINGE U-500) 31G X 6MM 0.5 ML MISC Inject 1 each into the skin 3 (three) times daily before meals. Use 3x a day 300 each 3  . metFORMIN (GLUCOPHAGE-XR) 500 MG 24 hr tablet Take 2000 mg by mouth with dinner 360 tablet 3  . montelukast (SINGULAIR) 10 MG tablet Take 10 mg by mouth at bedtime.    . Multiple Vitamins-Minerals (CENTRUM SILVER PO) Take 1 tablet by mouth daily.    . nitroGLYCERIN (NITROSTAT) 0.4 MG SL tablet Place 1 tablet (0.4 mg total) under the tongue every 5 (five) minutes as needed for chest pain. 25 tablet 3  . NON FORMULARY Methyfolate 2000 MCG daily    . olmesartan (BENICAR) 40 MG tablet Take 1 tablet (40 mg total) by mouth 2 (two) times daily. 180 tablet 3  . omega-3 acid ethyl esters (LOVAZA) 1 g capsule Take 2 capsules (2 g total) by mouth 2 (two) times daily. 360 capsule 3  . omeprazole (PRILOSEC) 20 MG capsule Take 1 capsule by mouth daily.    . potassium chloride (K-DUR,KLOR-CON) 10 MEQ tablet Take 2 tablets (20 mEq total) by mouth 2 (two) times daily. 360 tablet 3  . pravastatin (PRAVACHOL) 80 MG tablet Take 1 tablet (80 mg total) by mouth daily. 90 tablet 3   . Probiotic Product (PROBIOTIC DAILY PO) Take 1 tablet by mouth daily.    . rivaroxaban (XARELTO) 20 MG TABS tablet Take 1 tablet (20 mg total) by mouth daily with supper. 90 tablet 3  . spironolactone (ALDACTONE) 25 MG tablet Take 1 tablet (25 mg total) by mouth daily. 90 tablet 3   No current facility-administered medications on file prior to visit.    Allergies  Allergen Reactions  . Adhesive [Tape]     Rash   . Iodine     flushing  . Seasonal Ic [Cholestatin]   . Contrast Media [Iodinated Diagnostic Agents] Rash and Other (See Comments)    flushing EXTREME FLUSHING    Family History  Problem Relation Age of Onset  . Heart disease      No family history   PE: BP 134/80 (BP Location: Left Arm, Patient Position: Sitting)   Pulse 82   Ht 6' (1.829 m)   Wt (!) 335 lb (152 kg)   SpO2 96%   BMI 45.43 kg/m  Body mass index is 45.43 kg/m. Wt Readings from Last 3 Encounters:  01/12/17 (!) 335 lb (152 kg)  11/22/16 (!) 339 lb 6.4 oz (154 kg)  10/27/16 (!) 336 lb (152.4 kg)   Constitutional: obese, in NAD Eyes: PERRLA, EOMI, no exophthalmos ENT: moist mucous membranes, no thyromegaly, no cervical lymphadenopathy Cardiovascular: RRR, No MRG, + mild B LE swelling - wears compression hoses Respiratory: CTA B Gastrointestinal: abdomen soft, NT, ND, BS+ Musculoskeletal: no deformities, strength intact in all 4 Skin: moist, warm, no rashes Neurological: + tremor with outstretched hands, DTR normal in all 4  ASSESSMENT: 1. DM2, insulin-dependent, uncontrolled, with complications -  CAD, s/p CABG 5x in 1997 (Dr Roxy Horseman), iCMP >> CHF (Dr Stanford Breed) - CKD stage 3  PLAN:  1. Patient with long-standing, uncontrolled diabetes, on oral antidiabetic regimen + U500 insulin, which still poor control. At last visit, we did not change his mealtime U500 insulin doses, but we added a sliding scale. He is using this at least once a day. Since sugars still high, instead of increasing U500, will  try to the GLP1 receptor agonist. It appears that his insurance covers Tanzeum, so I will try to send this to his pharmacy. - Today, his Hba1c is identical to before, 7.6%, still above target - At this visit, we discussed about the fact that he may benefit from gastric bypass surgery. I advised him to think about it, since this would be a way to improve his diabetes, cholesterol levels, blood pressure, weight. He agrees to think about it. - I suggested to:  Patient Instructions  Please continue: - Metformin ER 2000 mg with dinner - Jardiance 25 mg in am - U500 insulin: - 80-90 units before breakfast - 25-30 units before lunch  - 25-30 units before dinner - U500 sliding scale:  150-175: + 2 units 176-200: + 3 units 201-225: + 4 units 226-300: + 5 units 301-325: + 6 units >325: + 7 units  Please start Tanzeum 30 mg weekly. Let me know when you are close to running out to call in the higher dose to your pharmacy (30 mg).  Please return in 3 months with your sugar log.   - continue checking sugars at different times of the day - check 3 times a day, rotating checks - advised for yearly eye exams >> he is UTD - reviewed report. - Return to clinic in 3 months with sugar  Philemon Kingdom, MD PhD [facility]

## 2017-01-12 NOTE — Patient Instructions (Addendum)
Please continue: - Metformin ER 2000 mg with dinner - Jardiance 25 mg in am - U500 insulin: - 80-90 units before breakfast - 25-30 units before lunch  - 25-30 units before dinner - U500 sliding scale:  150-175: + 2 units 176-200: + 3 units 201-225: + 4 units 226-300: + 5 units 301-325: + 6 units >325: + 7 units  Please start Tanzeum 30 mg weekly. Let me know when you are close to running out to call in the higher dose to your pharmacy (30 mg).  Please return in 3 months with your sugar log.

## 2017-01-12 NOTE — Addendum Note (Signed)
Addended by: Caprice Beaver T on: 01/12/2017 11:57 AM   Modules accepted: Orders

## 2017-01-13 ENCOUNTER — Other Ambulatory Visit: Payer: Self-pay | Admitting: Internal Medicine

## 2017-01-13 MED ORDER — DULAGLUTIDE 0.75 MG/0.5ML ~~LOC~~ SOAJ: SUBCUTANEOUS | 1 refills | Status: DC

## 2017-01-24 ENCOUNTER — Other Ambulatory Visit: Payer: Self-pay

## 2017-01-24 MED ORDER — EXENATIDE ER 2 MG ~~LOC~~ PEN: PEN_INJECTOR | SUBCUTANEOUS | 2 refills | Status: DC

## 2017-01-24 NOTE — Telephone Encounter (Signed)
According to PA information sent by insurance. Patient has to try Bydureon, or the PA would be denied. Dr.Gherghe gave okay to submit Bydureon 52m/week. I sent to pharmacy, we will see if this will work for patient.

## 2017-02-06 ENCOUNTER — Other Ambulatory Visit: Payer: Self-pay | Admitting: Cardiology

## 2017-02-07 NOTE — Telephone Encounter (Signed)
Rx request sent to pharmacy.  

## 2017-02-16 ENCOUNTER — Ambulatory Visit: Payer: Medicare Other | Admitting: Internal Medicine

## 2017-02-17 ENCOUNTER — Encounter: Payer: Self-pay | Admitting: Internal Medicine

## 2017-02-17 ENCOUNTER — Ambulatory Visit (INDEPENDENT_AMBULATORY_CARE_PROVIDER_SITE_OTHER): Payer: Medicare Other | Admitting: Internal Medicine

## 2017-02-17 VITALS — BP 140/82 | HR 80 | Wt 334.0 lb

## 2017-02-17 DIAGNOSIS — N183 Chronic kidney disease, stage 3 unspecified: Secondary | ICD-10-CM

## 2017-02-17 DIAGNOSIS — E1122 Type 2 diabetes mellitus with diabetic chronic kidney disease: Secondary | ICD-10-CM | POA: Diagnosis not present

## 2017-02-17 DIAGNOSIS — Z794 Long term (current) use of insulin: Secondary | ICD-10-CM

## 2017-02-17 DIAGNOSIS — I25810 Atherosclerosis of coronary artery bypass graft(s) without angina pectoris: Secondary | ICD-10-CM

## 2017-02-17 MED ORDER — INSULIN REGULAR HUMAN (CONC) 500 UNIT/ML ~~LOC~~ SOLN: SUBCUTANEOUS | 3 refills | Status: DC

## 2017-02-17 NOTE — Progress Notes (Signed)
Patient ID: [facility], male   DOB: 08/23/1941, 76 y.o.   MRN: [id number]  HPI: Robert Gregory is a 76 y.o.-year-old male, returning for f/u for DM2, dx in 2004, insulin-dependent, uncontrolled, with complications (CAD - Ischemic CMP, s/p CABG x5 in 1997, CHF; CKD stage 3). Last visit 1 mo ago.  Last hemoglobin A1c was: Lab Results  Component Value Date   HGBA1C 7.6 01/12/2017   HGBA1C 7.6 10/01/2016   HGBA1C 7.3 06/17/2016   Pt is on a regimen of:  - Bydureon 2 mg weekly >> started 12/2016 >> severe constipation >> Would like to stop - Metformin ER 2000 mg with dinner - Jardiance 25 mg in am - U500 insulin: - 80-90 units before breakfast - 25-30 units before lunch  - 25-30 units before dinner - U500 sliding scale:  150-175: + 2 units 176-200: + 3 units 201-225: + 4 units 226-300: + 5 units 301-325: + 6 units >325: + 7 units Stopped Cinnamon 500 mg 2x a day He gained 50 lbs with Actos in the past. Would not wan to restart. He was on Invokana 100 mg in am  Pt checks his sugars 3x a day - brings a good log: - am:144-190, 208 >> 131-208 >> 165-217 >> 153-207, 232 - 2h after b'fast: n/c >> 141-155 >> 191 >> n/c - before lunch: 151, 157 >> 173-269 >> n/c >> 170, 175, 253 - 2h after lunch:  213-272 >> 160-226, 300 >> 161, 205 >> 145-185 - before dinner:  73, 93-187, 209 >> 119, 126-207, 243 >> 98-229 - 2h after dinner: 108, 134-164, 308 >> 97-283, 318 >> 131-238 - bedtime: 105-164, 257 >> see above >> 126-289 - nighttime:164, 176 >> 110-175, 246 >> 144, 180 >> 154-284 No lows. Lowest sugar was 75 >> 93 >> 119 >> 98; he has hypoglycemia awareness at 70.  Highest sugar was 308 >> 238 >> 289  Glucometer: [employer] 2  Pt's meals are: - Breakfast: cereals + 2% milk, sausage + eggs + toast; still OJ - Lunch: soup + sandwich (lightest) - Dinner: meat + veggies + starch  - Snacks: 1- pm: peanuts, V8 juice  - + mild CKD, last BUN/creatinine:  Lab Results  Component Value Date    BUN 32 (H) 10/27/2016   CREATININE 1.5 (H) 10/27/2016  On Benicar 80 mg daily - last set of lipids: Lab Results  Component Value Date   CHOL 133 10/13/2016   HDL 22 (L) 10/13/2016   LDLCALC 38 10/13/2016   TRIG 367 (H) 10/13/2016   CHOLHDL 6.0 (H) 10/13/2016  On Pravachol 80 mg daily - last eye exam was in 12/2016: No DR. - He denies numbness and tingling in his feet.  ROS: Constitutional: no weight gain/no weight loss, + fatigue, no subjective hyperthermia, no subjective hypothermia, + nocturia Eyes: no blurry vision, no xerophthalmia ENT: no sore throat, no nodules palpated in throat, no dysphagia, no odynophagia, no hoarseness Cardiovascular: no CP/no SOB/no palpitations/no leg swelling Respiratory: no cough/no SOB/no wheezing Gastrointestinal: no N/no V/no D/+ C/no acid reflux Musculoskeletal: no muscle aches/no joint aches Skin: no rashes, no hair loss Neurological: no tremors/no numbness/no tingling/no dizziness  I reviewed pt's medications, allergies, PMH, social hx, family hx, and changes were documented in the history of present illness. Otherwise, unchanged from my initial visit note.   Past Medical History:  Diagnosis Date  . Arthritis   . Atrial fibrillation ([place])    takes Xarelto daily  . CAD (  coronary artery disease)   . Cardiomyopathy, ischemic 11/07/2013  . Cholelithiasis   . Chronic back pain    scoliosis/arthritis  . Complication of anesthesia    forgetful for about 2-3wks after anesthesia  . Congestive heart failure ([place])    takes Spironolactone daily  . Diabetes mellitus ([place])    takes Invokamet daily as well as Hum U  . History of colon polyps   . History of kidney stones   . Hyperlipidemia    takes [employer] daily  . Hyperlipidemia 11/07/2013  . Hypertension    takes Benicar and Coreg daily  . ICD (implantable cardioverter-defibrillator) battery depletion   . ICD (implantable cardioverter-defibrillator) in place 11/07/2013   . Ischemic cardiomyopathy   . Ischemic cardiomyopathy   . Joint pain   . Myocardial infarction ([place]) 1997   on the OR table   . Nephrolithiasis   . Obstructive sleep apnea   . Peripheral neuropathy   . Pneumonia    as a child  . Spinal stenosis   . Thrombocytopenia ([place])   . Total knee replacement status 11/29/2014  . Type 2 diabetes mellitus with stage 3 chronic kidney disease ([place]) 05/19/2015  . Urinary frequency   . Urinary urgency    Past Surgical History:  Procedure Laterality Date  . CARDIAC CATHETERIZATION N/A 10/02/2015   Procedure: Left Heart Cath and Cors/Grafts Angiography;  Surgeon: Belva Crome, MD;  Location: [place] [facility];  Service: Cardiovascular;  Laterality: N/A;  . COLONOSCOPY    . coronary artery bypass and graft  1997   x 5  . ESOPHAGOGASTRODUODENOSCOPY    . ICD placed    . LAMINECTOMY    . LITHOTRIPSY    . Right rotator cuff surgery    . TONSILLECTOMY     adenoids  . TOTAL KNEE ARTHROPLASTY Left   . TOTAL KNEE ARTHROPLASTY Right 11/29/2014   Procedure: RIGHT TOTAL KNEE ARTHROPLASTY;  Surgeon: Leandrew Koyanagi, MD;  Location: [place];  Service: Orthopedics;  Laterality: Right;   Social History   Social History  . Marital status: Married    Spouse name: N/A  . Number of children: N/A  . Years of education: N/A   Occupational History  .      Retired from [employer]  . Smoking status: Never Smoker  . Smokeless tobacco: Never Used  . Alcohol use 0.0 oz/week     Comment: once drink a month  . Drug use: No  . Sexual activity: Not on file   Other Topics Concern  . Not on file   Social History Narrative  . No narrative on file   Current Outpatient Prescriptions on File Prior to Visit  Medication Sig Dispense Refill  . amoxicillin (AMOXIL) 500 MG capsule Take 500 mg by mouth as directed. 6 prior to dental work    . carvedilol (COREG) 12.5 MG tablet Take 1 tablet (12.5 mg total) by mouth 2 (two) times daily with a  meal. 180 tablet 3  . cholecalciferol (VITAMIN D) 1000 units tablet Take 1,000 Units by mouth daily.    . Coenzyme Q10 200 MG TABS Take 1 tablet by mouth daily.    . Cranberry 200 MG CAPS Take 200 mg by mouth daily.     . empagliflozin (JARDIANCE) 25 MG TABS tablet Take 25 mg by mouth daily. 90 tablet 3  . ergocalciferol (VITAMIN D2) 50000 units capsule Take 50,000 Units by mouth once a week.    Marland Kitchen  Exenatide ER (BYDUREON) 2 MG PEN Inject 2 mg weekly 4 each 2  . ezetimibe (ZETIA) 10 MG tablet Take 1 tablet (10 mg total) by mouth daily. 90 tablet 3  . fenofibrate (TRICOR) 48 MG tablet Take 1 tablet (48 mg total) by mouth daily. 90 tablet 3  . furosemide (LASIX) 40 MG tablet Take 1 tablet (40 mg total) by mouth daily. 90 tablet 3  . insulin regular human CONCENTRATED (HUMULIN R) 500 UNIT/ML injection Inject up to 100 units in am, and up to 50 units with lunch and dinner 60 mL 3  . Insulin Syringe/Needle U-500 (BD INSULIN SYRINGE U-500) 31G X 6MM 0.5 ML MISC Inject 1 each into the skin 3 (three) times daily before meals. Use 3x a day 300 each 3  . KLOR-CON 10 10 MEQ tablet TAKE 2 TABLETS (20 MEQ TOTAL) TWICE A DAY 360 tablet 3  . metFORMIN (GLUCOPHAGE-XR) 500 MG 24 hr tablet Take 2000 mg by mouth with dinner 360 tablet 3  . montelukast (SINGULAIR) 10 MG tablet Take 10 mg by mouth at bedtime.    . Multiple Vitamins-Minerals (CENTRUM SILVER PO) Take 1 tablet by mouth daily.    . nitroGLYCERIN (NITROSTAT) 0.4 MG SL tablet Place 1 tablet (0.4 mg total) under the tongue every 5 (five) minutes as needed for chest pain. 25 tablet 3  . NON FORMULARY Methyfolate 2000 MCG daily    . olmesartan (BENICAR) 40 MG tablet Take 1 tablet (40 mg total) by mouth 2 (two) times daily. 180 tablet 3  . omega-3 acid ethyl esters (LOVAZA) 1 g capsule Take 2 capsules (2 g total) by mouth 2 (two) times daily. 360 capsule 3  . omeprazole (PRILOSEC) 20 MG capsule Take 1 capsule by mouth daily.    . pravastatin (PRAVACHOL) 80 MG  tablet Take 1 tablet (80 mg total) by mouth daily. 90 tablet 3  . Probiotic Product (PROBIOTIC DAILY PO) Take 1 tablet by mouth daily.    . rivaroxaban (XARELTO) 20 MG TABS tablet Take 1 tablet (20 mg total) by mouth daily with supper. 90 tablet 3  . spironolactone (ALDACTONE) 25 MG tablet Take 1 tablet (25 mg total) by mouth daily. 90 tablet 3   No current facility-administered medications on file prior to visit.    Allergies  Allergen Reactions  . Adhesive [Tape]     Rash   . Iodine     flushing  . Seasonal Ic [Cholestatin]   . Contrast Media [Iodinated Diagnostic Agents] Rash and Other (See Comments)    flushing EXTREME FLUSHING    Family History  Problem Relation Age of Onset  . Heart disease Unknown        No family history   PE: BP 140/82 (BP Location: Left Arm, Patient Position: Sitting)   Pulse 80   Wt (!) 334 lb (151.5 kg)   SpO2 95%   BMI 45.30 kg/m  Body mass index is 45.3 kg/m. Wt Readings from Last 3 Encounters:  02/17/17 (!) 334 lb (151.5 kg)  01/12/17 (!) 335 lb (152 kg)  11/22/16 (!) 339 lb 6.4 oz (154 kg)   Constitutional: Obese, in NAD Eyes: PERRLA, EOMI, no exophthalmos ENT: moist mucous membranes, no thyromegaly, no cervical lymphadenopathy Cardiovascular: RRR, No MRG, + mild B LE swelling - wears compression hoses Respiratory: CTA B Gastrointestinal: abdomen soft, NT, ND, BS+ Musculoskeletal: no deformities, strength intact in all 4 Skin: moist, warm, no rashes Neurological: no tremor with outstretched hands, DTR normal in all  4   ASSESSMENT: 1. DM2, insulin-dependent, uncontrolled, with complications - CAD, s/p CABG 5x in 1997 (Dr Roxy Horseman), iCMP >> CHF (Dr Stanford Breed) - CKD stage 3  PLAN:  1. Patient with long-standing, uncontrolled diabetes, on oral antidiabetic regimen + U500 insulin, to which we added a GLP-1 receptor agonist at last visit. He could not get the Trulicity since this was not covered, but he got Bydureon of which he had 3  injections. He developed constipation, which is severe and uncomfortable, and he would like to stop the medication. I advised him to not continue with injections. However, since his sugars are still fluctuating and mostly in the hyperglycemia range, when need to increase his U500 insulin. I also advised him to take a low dose of 500 at that time, if sugars are higher dental 100s. - We reviewed his last HbA1c together and this was 7.6% - At last visit, we discussed about the fact that he may benefit from gastric bypass surgery. I advised him to think about it, since this would be a way to improve his diabetes, cholesterol levels, blood pressure, weight. He did not decide about this yet. - I suggested to:  Patient Instructions  Please continue: - Metformin ER 2000 mg with dinner - Jardiance 25 mg in am  Change: - U500 insulin: - 90-100 units before breakfast - 30-35 units before lunch  - 30-35 units before dinner - U500 sliding scale:  150-175: + 2 units 176-200: + 3 units 201-225: + 4 units 226-300: + 5 units 301-325: + 6 units >325: + 7 units  Use 500 sliding scale at bedtime if the sugars are >200.  Stop Bydureon.  Please return in 2 months with your sugar log.   - continue checking sugars at different times of the day - check 3x a day, rotating checks - advised for yearly eye exams >> he is UTD - Return to clinic in 3 mo with sugar log   Philemon Kingdom, MD PhD [facility]

## 2017-02-17 NOTE — Patient Instructions (Signed)
Please continue: - Metformin ER 2000 mg with dinner - Jardiance 25 mg in am  Change: - U500 insulin: - 90-100 units before breakfast - 30-35 units before lunch  - 30-35 units before dinner - U500 sliding scale:  150-175: + 2 units 176-200: + 3 units 201-225: + 4 units 226-300: + 5 units 301-325: + 6 units >325: + 7 units  Use 500 sliding scale at bedtime if the sugars are >200.  Stop Bydureon.  Please return in 2 months with your sugar log.

## 2017-02-21 ENCOUNTER — Ambulatory Visit (INDEPENDENT_AMBULATORY_CARE_PROVIDER_SITE_OTHER): Payer: Medicare Other | Admitting: *Deleted

## 2017-02-21 ENCOUNTER — Encounter: Payer: Self-pay | Admitting: Internal Medicine

## 2017-02-21 DIAGNOSIS — L84 Corns and callosities: Secondary | ICD-10-CM | POA: Diagnosis not present

## 2017-02-21 DIAGNOSIS — I255 Ischemic cardiomyopathy: Secondary | ICD-10-CM

## 2017-02-21 DIAGNOSIS — E1142 Type 2 diabetes mellitus with diabetic polyneuropathy: Secondary | ICD-10-CM | POA: Diagnosis not present

## 2017-02-21 DIAGNOSIS — M7741 Metatarsalgia, right foot: Secondary | ICD-10-CM | POA: Diagnosis not present

## 2017-02-21 NOTE — Progress Notes (Signed)
Remote ICD transmission.   

## 2017-02-23 LAB — CUP PACEART REMOTE DEVICE CHECK
Battery Remaining Longevity: 66 mo
Battery Remaining Percentage: 63 %
Battery Voltage: 2.95 V
Brady Statistic RV Percent Paced: 1 %
Date Time Interrogation Session: 20180604060018
HIGH POWER IMPEDANCE MEASURED VALUE: 89 Ohm
HIGH POWER IMPEDANCE MEASURED VALUE: 89 Ohm
Implantable Lead Implant Date: 20140320
Implantable Lead Implant Date: 20140320
Implantable Lead Location: 753859
Implantable Lead Location: 753860
Lead Channel Impedance Value: 410 Ohm
Lead Channel Pacing Threshold Amplitude: 0.75 V
Lead Channel Sensing Intrinsic Amplitude: 11.2 mV
Lead Channel Sensing Intrinsic Amplitude: 2.5 mV
Lead Channel Setting Sensing Sensitivity: 0.5 mV
MDC IDC MSMT LEADCHNL RA IMPEDANCE VALUE: 390 Ohm
MDC IDC MSMT LEADCHNL RV PACING THRESHOLD PULSEWIDTH: 0.5 ms
MDC IDC PG IMPLANT DT: 20140320
MDC IDC SET LEADCHNL RV PACING AMPLITUDE: 2 V
MDC IDC SET LEADCHNL RV PACING PULSEWIDTH: 0.5 ms
Pulse Gen Serial Number: 7067730

## 2017-02-27 ENCOUNTER — Ambulatory Visit (HOSPITAL_BASED_OUTPATIENT_CLINIC_OR_DEPARTMENT_OTHER)
Admission: RE | Admit: 2017-02-27 | Discharge: 2017-02-27 | Disposition: A | Discharge status: Home / Self Care | Payer: Medicare Other | Source: Ambulatory Visit | Attending: Hematology & Oncology | Admitting: Hematology & Oncology

## 2017-02-27 DIAGNOSIS — K8020 Calculus of gallbladder without cholecystitis without obstruction: Secondary | ICD-10-CM | POA: Insufficient documentation

## 2017-02-27 DIAGNOSIS — N2889 Other specified disorders of kidney and ureter: Secondary | ICD-10-CM | POA: Insufficient documentation

## 2017-02-27 DIAGNOSIS — N289 Disorder of kidney and ureter, unspecified: Secondary | ICD-10-CM | POA: Diagnosis not present

## 2017-02-27 DIAGNOSIS — D696 Thrombocytopenia, unspecified: Secondary | ICD-10-CM | POA: Insufficient documentation

## 2017-02-27 DIAGNOSIS — R161 Splenomegaly, not elsewhere classified: Secondary | ICD-10-CM | POA: Diagnosis not present

## 2017-02-28 ENCOUNTER — Ambulatory Visit (HOSPITAL_BASED_OUTPATIENT_CLINIC_OR_DEPARTMENT_OTHER): Payer: Medicare Other | Admitting: Hematology & Oncology

## 2017-02-28 ENCOUNTER — Other Ambulatory Visit (HOSPITAL_BASED_OUTPATIENT_CLINIC_OR_DEPARTMENT_OTHER): Payer: Medicare Other

## 2017-02-28 VITALS — BP 125/55 | HR 78 | Temp 98.5°F | Resp 18 | Wt 330.0 lb

## 2017-02-28 DIAGNOSIS — I4819 Other persistent atrial fibrillation: Secondary | ICD-10-CM

## 2017-02-28 DIAGNOSIS — D696 Thrombocytopenia, unspecified: Secondary | ICD-10-CM

## 2017-02-28 DIAGNOSIS — N289 Disorder of kidney and ureter, unspecified: Secondary | ICD-10-CM

## 2017-02-28 DIAGNOSIS — N2889 Other specified disorders of kidney and ureter: Secondary | ICD-10-CM

## 2017-02-28 DIAGNOSIS — R161 Splenomegaly, not elsewhere classified: Secondary | ICD-10-CM | POA: Diagnosis not present

## 2017-02-28 DIAGNOSIS — E119 Type 2 diabetes mellitus without complications: Secondary | ICD-10-CM

## 2017-02-28 LAB — CBC WITH DIFFERENTIAL (CANCER CENTER ONLY)
BASO#: 0 10*3/uL (ref 0.0–0.2)
BASO%: 0.4 % (ref 0.0–2.0)
EOS%: 2.9 % (ref 0.0–7.0)
Eosinophils Absolute: 0.2 10*3/uL (ref 0.0–0.5)
HCT: 43.9 % (ref 38.7–49.9)
HEMOGLOBIN: 14.6 g/dL (ref 13.0–17.1)
LYMPH#: 0.9 10*3/uL (ref 0.9–3.3)
LYMPH%: 16.3 % (ref 14.0–48.0)
MCH: 31 pg (ref 28.0–33.4)
MCHC: 33.3 g/dL (ref 32.0–35.9)
MCV: 93 fL (ref 82–98)
MONO#: 0.5 10*3/uL (ref 0.1–0.9)
MONO%: 9.3 % (ref 0.0–13.0)
NEUT%: 71.1 % (ref 40.0–80.0)
NEUTROS ABS: 3.7 10*3/uL (ref 1.5–6.5)
RBC: 4.71 10*6/uL (ref 4.20–5.70)
RDW: 15.7 % (ref 11.1–15.7)
WBC: 5.3 10*3/uL (ref 4.0–10.0)

## 2017-02-28 LAB — CMP (CANCER CENTER ONLY)
ALBUMIN: 3.7 g/dL (ref 3.3–5.5)
ALK PHOS: 52 U/L (ref 26–84)
ALT(SGPT): 51 U/L — ABNORMAL HIGH (ref 10–47)
AST: 48 U/L — AB (ref 11–38)
BILIRUBIN TOTAL: 0.8 mg/dL (ref 0.20–1.60)
BUN: 30 mg/dL — AB (ref 7–22)
CO2: 25 meq/L (ref 18–33)
CREATININE: 1.3 mg/dL — AB (ref 0.6–1.2)
Calcium: 9.7 mg/dL (ref 8.0–10.3)
Chloride: 105 mEq/L (ref 98–108)
Glucose, Bld: 209 mg/dL — ABNORMAL HIGH (ref 73–118)
Potassium: 4 mEq/L (ref 3.3–4.7)
SODIUM: 140 meq/L (ref 128–145)
Total Protein: 6.9 g/dL (ref 6.4–8.1)

## 2017-02-28 LAB — CHCC SATELLITE - SMEAR

## 2017-02-28 NOTE — Progress Notes (Signed)
Hematology and Oncology Follow Up Visit  Robert Gregory [id number] 1941-06-26 76 y.o. 02/28/2017   Principle Diagnosis:   Chronic thrombocytopenia secondary to splenomegaly  1.57 m exophytic lesion lower pole left kidney  Current Therapy:    Observation     Interim History:  Robert Gregory is back for follow-up. We got ultrasound of his abdomen today. This actually shows that his spleen is slightly smaller in size. His splenic volume is now 1020m.  The left renal lesion is now 16 mm.  He's had no bleeding. He's had no bruising.  He is diabetic. He tries watch his blood sugars.  [place]. He is going to have friends come and visit.  Overall, his performance status is ECOG 1.  Medications:  Current Outpatient Prescriptions:  .  amoxicillin (AMOXIL) 500 MG capsule, Take 500 mg by mouth as directed. 6 prior to dental work, Disp: , Rfl:  .  carvedilol (COREG) 12.5 MG tablet, Take 1 tablet (12.5 mg total) by mouth 2 (two) times daily with a meal., Disp: 180 tablet, Rfl: 3 .  cholecalciferol (VITAMIN D) 1000 units tablet, Take 1,000 Units by mouth daily., Disp: , Rfl:  .  Coenzyme Q10 200 MG TABS, Take 1 tablet by mouth daily., Disp: , Rfl:  .  Cranberry 200 MG CAPS, Take 200 mg by mouth daily. , Disp: , Rfl:  .  empagliflozin (JARDIANCE) 25 MG TABS tablet, Take 25 mg by mouth daily., Disp: 90 tablet, Rfl: 3 .  ergocalciferol (VITAMIN D2) 50000 units capsule, Take 50,000 Units by mouth once a week., Disp: , Rfl:  .  ezetimibe (ZETIA) 10 MG tablet, Take 1 tablet (10 mg total) by mouth daily., Disp: 90 tablet, Rfl: 3 .  fenofibrate (TRICOR) 48 MG tablet, Take 1 tablet (48 mg total) by mouth daily., Disp: 90 tablet, Rfl: 3 .  furosemide (LASIX) 40 MG tablet, Take 1 tablet (40 mg total) by mouth daily., Disp: 90 tablet, Rfl: 3 .  insulin regular human CONCENTRATED (HUMULIN R) 500 UNIT/ML injection, Inject up to 100  units in am, and up to 35 units with lunch and dinner, Disp: 60 mL, Rfl: 3 .  Insulin Syringe/Needle U-500 (BD INSULIN SYRINGE U-500) 31G X 6MM 0.5 ML MISC, Inject 1 each into the skin 3 (three) times daily before meals. Use 3x a day, Disp: 300 each, Rfl: 3 .  KLOR-CON 10 10 MEQ tablet, TAKE 2 TABLETS (20 MEQ TOTAL) TWICE A DAY, Disp: 360 tablet, Rfl: 3 .  Levomefolate Calcium POWD, Take by mouth., Disp: , Rfl:  .  metFORMIN (GLUCOPHAGE-XR) 500 MG 24 hr tablet, Take 2000 mg by mouth with dinner, Disp: 360 tablet, Rfl: 3 .  montelukast (SINGULAIR) 10 MG tablet, Take 10 mg by mouth at bedtime., Disp: , Rfl:  .  Multiple Vitamins-Minerals (CENTRUM SILVER PO), Take 1 tablet by mouth daily., Disp: , Rfl:  .  nitroGLYCERIN (NITROSTAT) 0.4 MG SL tablet, Place 1 tablet (0.4 mg total) under the tongue every 5 (five) minutes as needed for chest pain., Disp: 25 tablet, Rfl: 3 .  NON FORMULARY, Methyfolate 2000 MCG daily, Disp: , Rfl:  .  olmesartan (BENICAR) 40 MG tablet, Take 1 tablet (40 mg total) by mouth 2 (two) times daily., Disp: 180 tablet, Rfl: 3 .  omega-3 acid ethyl esters (LOVAZA) 1 g capsule, Take 2 capsules (2 g total) by mouth 2 (two) times daily., Disp:  360 capsule, Rfl: 3 .  pravastatin (PRAVACHOL) 80 MG tablet, Take 1 tablet (80 mg total) by mouth daily., Disp: 90 tablet, Rfl: 3 .  Probiotic Product (PROBIOTIC DAILY PO), Take 1 tablet by mouth daily., Disp: , Rfl:  .  rivaroxaban (XARELTO) 20 MG TABS tablet, Take 1 tablet (20 mg total) by mouth daily with supper., Disp: 90 tablet, Rfl: 3 .  spironolactone (ALDACTONE) 25 MG tablet, Take 1 tablet (25 mg total) by mouth daily., Disp: 90 tablet, Rfl: 3  Allergies:  Allergies  Allergen Reactions  . Adhesive [Tape]     Rash   . Iodine     flushing  . Seasonal Ic [Cholestatin]   . Contrast Media [Iodinated Diagnostic Agents] Rash and Other (See Comments)    flushing EXTREME FLUSHING     Past Medical History, Surgical history, Social  history, and Family History were reviewed and updated.  Review of Systems: As above  Physical Exam:  weight is 330 lb (149.7 kg) (abnormal). His oral temperature is 98.5 F (36.9 C). His blood pressure is 125/55 (abnormal) and his pulse is 78. His respiration is 18 and oxygen saturation is 96%.   Wt Readings from Last 3 Encounters:  02/28/17 (!) 330 lb (149.7 kg)  02/17/17 (!) 334 lb (151.5 kg)  01/12/17 (!) 335 lb (152 kg)     Morbidly obese white male in no obvious distress. Head and neck exam shows no ocular or oral lesions. He has no palpable cervical or supraclavicular lymph nodes. Lungs are clear. Cardiac exam irregular rate and irrhythm, consistent with atrial fibrillation. He has no murmurs, rubs or bruits. Abdomen is obese but soft. He has decent bowel sounds. There is no guarding or rebound tenderness. He has no fluid wave. There is no obvious hepatomegaly. I cannot palpate his spleen tip. Back exam shows no tenderness over the spine, ribs or hips. Extremities shows some chronic nonpitting edema in his lower legs. Skin exam shows no rashes, ecchymoses or petechia. Neurological exam shows no focal neurological deficits.  Lab Results  Component Value Date   WBC 5.3 02/28/2017   HGB 14.6 02/28/2017   HCT 43.9 02/28/2017   MCV 93 02/28/2017   PLT 86 Platelet count consistent in citrate (L) 02/28/2017     Chemistry      Component Value Date/Time   NA 140 02/28/2017 1041   NA 141 04/21/2016 0852   K 4.0 02/28/2017 1041   K 4.4 04/21/2016 0852   CL 105 02/28/2017 1041   CO2 25 02/28/2017 1041   CO2 22 04/21/2016 0852   BUN 30 (H) 02/28/2017 1041   BUN 29.0 (H) 04/21/2016 0852   CREATININE 1.3 (H) 02/28/2017 1041   CREATININE 1.6 (H) 04/21/2016 0852      Component Value Date/Time   CALCIUM 9.7 02/28/2017 1041   CALCIUM 10.2 04/21/2016 0852   ALKPHOS 52 02/28/2017 1041   ALKPHOS 51 04/21/2016 0852   AST 48 (H) 02/28/2017 1041   AST 39 (H) 04/21/2016 0852   ALT 51 (H)  02/28/2017 1041   ALT 46 04/21/2016 0852   BILITOT 0.80 02/28/2017 1041   BILITOT 0.71 04/21/2016 0852         Impression and Plan: Robert Gregory is a 76 year old white male. He has moderate thrombocytopenia. His platelet count is slowly trending upward. I still feel that he has splenic sequestration. The fact that his spleen is smaller is encouraging.  I think his biggest problem will definitely be his diabetes. I  told him that he has to get his blood sugars under better control or else he will continue to have progressive cirrhosis which will cause more problems than just thrombocytopenia.  I still feel good about the renal lesion. I really don't think this is that much different. However, I think we have to keep following this.  I think we can get him back in 6 months. We will repeat the ultrasound in 6 months. If the left renal lesion is larger, that he will need a CT scan as he cannot have a MRI because of his implantable defibrillator   [name], MD 6/11/201812:03 PM

## 2017-03-01 ENCOUNTER — Encounter: Payer: Self-pay | Admitting: Cardiology

## 2017-03-07 DIAGNOSIS — G4733 Obstructive sleep apnea (adult) (pediatric): Secondary | ICD-10-CM | POA: Diagnosis not present

## 2017-03-16 ENCOUNTER — Other Ambulatory Visit: Payer: Self-pay | Admitting: Cardiology

## 2017-03-16 DIAGNOSIS — Z95810 Presence of automatic (implantable) cardiac defibrillator: Secondary | ICD-10-CM

## 2017-03-16 DIAGNOSIS — I480 Paroxysmal atrial fibrillation: Secondary | ICD-10-CM

## 2017-03-16 DIAGNOSIS — I255 Ischemic cardiomyopathy: Secondary | ICD-10-CM

## 2017-03-16 DIAGNOSIS — I2583 Coronary atherosclerosis due to lipid rich plaque: Secondary | ICD-10-CM

## 2017-03-16 DIAGNOSIS — E785 Hyperlipidemia, unspecified: Secondary | ICD-10-CM

## 2017-03-16 DIAGNOSIS — N289 Disorder of kidney and ureter, unspecified: Secondary | ICD-10-CM

## 2017-03-16 DIAGNOSIS — I2510 Atherosclerotic heart disease of native coronary artery without angina pectoris: Secondary | ICD-10-CM

## 2017-03-16 DIAGNOSIS — I10 Essential (primary) hypertension: Secondary | ICD-10-CM

## 2017-03-21 ENCOUNTER — Other Ambulatory Visit: Payer: Self-pay | Admitting: Cardiology

## 2017-03-24 ENCOUNTER — Telehealth: Payer: Self-pay | Admitting: Internal Medicine

## 2017-03-24 NOTE — Telephone Encounter (Signed)
Pt brought forms and letter with instructions for Gherghe to fill out and send in. Put in Dr tray.

## 2017-04-29 ENCOUNTER — Ambulatory Visit: Payer: Medicare Other | Admitting: Internal Medicine

## 2017-05-06 ENCOUNTER — Ambulatory Visit (INDEPENDENT_AMBULATORY_CARE_PROVIDER_SITE_OTHER): Payer: Medicare Other | Admitting: Internal Medicine

## 2017-05-06 ENCOUNTER — Encounter: Payer: Self-pay | Admitting: Internal Medicine

## 2017-05-06 VITALS — BP 132/62 | HR 89 | Wt 331.0 lb

## 2017-05-06 DIAGNOSIS — Z794 Long term (current) use of insulin: Secondary | ICD-10-CM | POA: Diagnosis not present

## 2017-05-06 DIAGNOSIS — I255 Ischemic cardiomyopathy: Secondary | ICD-10-CM | POA: Diagnosis not present

## 2017-05-06 DIAGNOSIS — N183 Chronic kidney disease, stage 3 unspecified: Secondary | ICD-10-CM

## 2017-05-06 DIAGNOSIS — E1122 Type 2 diabetes mellitus with diabetic chronic kidney disease: Secondary | ICD-10-CM | POA: Diagnosis not present

## 2017-05-06 DIAGNOSIS — IMO0001 Reserved for inherently not codable concepts without codable children: Secondary | ICD-10-CM

## 2017-05-06 DIAGNOSIS — E669 Obesity, unspecified: Secondary | ICD-10-CM | POA: Diagnosis not present

## 2017-05-06 DIAGNOSIS — Z6841 Body Mass Index (BMI) 40.0 and over, adult: Secondary | ICD-10-CM

## 2017-05-06 LAB — POCT GLYCOSYLATED HEMOGLOBIN (HGB A1C): HEMOGLOBIN A1C: 7.5

## 2017-05-06 NOTE — Patient Instructions (Addendum)
Please continue: - Metformin ER 2000 mg with dinner - Jardiance 25 mg in am - U500 insulin: - 90-100 units before breakfast - 30-35 units before lunch  - 30-35 units before dinner  Please use the sliding scale consistently: - U500 sliding scale:  150-175: + 2 units 176-200: + 3 units 201-225: + 4 units 226-300: + 5 units 301-325: + 6 units >325: + 7 units  Use 500 sliding scale at bedtime if the sugars are >200.  Please return in 3 months with your sugar log.

## 2017-05-06 NOTE — Addendum Note (Signed)
Addended by: Caprice Beaver T on: 05/06/2017 10:53 AM   Modules accepted: Orders

## 2017-05-06 NOTE — Progress Notes (Signed)
Patient ID: Robert Gregory, male   DOB: 08-Jul-1941, 76 y.o.   MRN: [id number]  HPI: Robert Gregory is a 76 y.o.-year-old male, returning for f/u for DM2, dx in 2004, insulin-dependent, uncontrolled, with complications (CAD - Ischemic CMP, s/p CABG x5 in 1997, CHF; CKD stage 3). Last visit 2.5 mo ago.  Last hemoglobin A1c was: Lab Results  Component Value Date   HGBA1C 7.6 01/12/2017   HGBA1C 7.6 10/01/2016   HGBA1C 7.3 06/17/2016   Pt is on a regimen of: - Metformin ER 2000 mg with dinner - Jardiance 25 mg in am - U500 insulin: - 90-100 units before breakfast - 30-35 units before lunch  - 30-35 units before dinner - U500 sliding scale: Not usually using 150-175: + 2 units 176-200: + 3 units 201-225: + 4 units 226-300: + 5 units 301-325: + 6 units >325: + 7 units Stopped Cinnamon 500 mg 2x a day He gained 50 lbs with Actos in the past. Would not wan to restart. He was on Invokana 100 mg in am We stopped Bydureon at last visit 2/2 severe constipation >> this resolved.  Pt checks his sugars 3x a day >> per his great log - higher in August: - am:144-190, 208 >> 131-208 >> 165-217 >> 153-207, 232 >> 157-228 - 2h after b'fast: n/c >> 141-155 >> 191 >> n/c >> 194 - before lunch: 151, 157 >> 173-269 >> n/c >> 170, 175, 253 >> 168-274 - 2h after lunch:  213-272 >> 160-226, 300 >> 161, 205 >> 145-185 >> 185-268 - before dinner:  73, 93-187, 209 >> 119, 126-207, 243 >> 98-229 >> 73, 83-180, 234 - 2h after dinner: 108, 134-164, 308 >> 97-283, 318 >> 131-238 >> 104-224, 243 - bedtime: 105-164, 257 >> see above >> 126-289 >> 113-256 - nighttime:164, 176 >> 110-175, 246 >> 144, 180 >> 154-284 >> 113-172 Had 2 lows. Lowest sugar was 98 >> 73; he has hypoglycemia awareness at 70.  Highest sugar was 289 >> 274  Glucometer: [employer] 2  Pt's meals are: - Breakfast: cereals + 2% milk, sausage + eggs + toast; still OJ - Lunch: soup + sandwich (lightest) - Dinner: meat + veggies + starch  -  Snacks: 1- pm: peanuts, V8 juice  - he has mild CKD, last BUN/creatinine:  Lab Results  Component Value Date   BUN 30 (H) 02/28/2017   CREATININE 1.3 (H) 02/28/2017  On Benicar 80 mg daily. Lab Results  Component Value Date   GFRNONAA 61 (L) 12/01/2014   GFRNONAA 43 (L) 11/30/2014   GFRNONAA 49 (L) 11/19/2014   GFRNONAA 56 (L) 09/18/2014   GFRNONAA 37 (L) 09/11/2014   GFRNONAA 46 (L) 09/03/2014   GFRNONAA 56 (L) 07/31/2014   GFRNONAA 60 11/14/2013   - last set of lipids: Lab Results  Component Value Date   CHOL 133 10/13/2016   HDL 22 (L) 10/13/2016   LDLCALC 38 10/13/2016   TRIG 367 (H) 10/13/2016   CHOLHDL 6.0 (H) 10/13/2016  On Pravachol 80 mg daily. - last eye exam was in 12/2016 >> no DR - he denies numbness and tingling in his feet.  ROS: Constitutional: no weight gain/loss, no fatigue, no subjective hyperthermia/hypothermia Eyes: no blurry vision, no xerophthalmia ENT: no sore throat, no nodules palpated in throat, no dysphagia/odynophagia, no hoarseness Cardiovascular: no CP/SOB/palpitations/leg swelling Respiratory: no cough/SOB Gastrointestinal: no N/V/D/C Musculoskeletal: no muscle/joint aches Skin: no rashes Neurological: no tremors/numbness/tingling/dizziness Psychiatric: no depression/anxiety   Past Medical History:  Diagnosis Date  . Arthritis   . Atrial fibrillation ([place])    takes Xarelto daily  . CAD (coronary artery disease)   . Cardiomyopathy, ischemic 11/07/2013  . Cholelithiasis   . Chronic back pain    scoliosis/arthritis  . Complication of anesthesia    forgetful for about 2-3wks after anesthesia  . Congestive heart failure ([place])    takes Spironolactone daily  . Diabetes mellitus ([place])    takes Invokamet daily as well as Hum U  . History of colon polyps   . History of kidney stones   . Hyperlipidemia    takes [employer] daily  . Hyperlipidemia 11/07/2013  . Hypertension    takes Benicar and Coreg daily  . ICD  (implantable cardioverter-defibrillator) battery depletion   . ICD (implantable cardioverter-defibrillator) in place 11/07/2013  . Ischemic cardiomyopathy   . Ischemic cardiomyopathy   . Joint pain   . Myocardial infarction ([place]) 1997   on the OR table   . Nephrolithiasis   . Obstructive sleep apnea   . Peripheral neuropathy   . Pneumonia    as a child  . Spinal stenosis   . Thrombocytopenia ([place])   . Total knee replacement status 11/29/2014  . Type 2 diabetes mellitus with stage 3 chronic kidney disease ([place]) 05/19/2015  . Urinary frequency   . Urinary urgency    Past Surgical History:  Procedure Laterality Date  . CARDIAC CATHETERIZATION N/A 10/02/2015   Procedure: Left Heart Cath and Cors/Grafts Angiography;  Surgeon: Belva Crome, MD;  Location: [facility];  Service: Cardiovascular;  Laterality: N/A;  . COLONOSCOPY    . coronary artery bypass and graft  1997   x 5  . ESOPHAGOGASTRODUODENOSCOPY    . ICD placed    . LAMINECTOMY    . LITHOTRIPSY    . Right rotator cuff surgery    . TONSILLECTOMY     adenoids  . TOTAL KNEE ARTHROPLASTY Left   . TOTAL KNEE ARTHROPLASTY Right 11/29/2014   Procedure: RIGHT TOTAL KNEE ARTHROPLASTY;  Surgeon: Leandrew Koyanagi, MD;  Location: [facility];  Service: Orthopedics;  Laterality: Right;   Social History   Social History  . Marital status: Married    Spouse name: N/A  . Number of children: N/A   Occupational History  .      Retired from [employer]  . Smoking status: Never Smoker  . Smokeless tobacco: Never Used  . Alcohol use 0.0 oz/week     Comment: once drink a month  . Drug use: No   Current Outpatient Prescriptions on File Prior to Visit  Medication Sig Dispense Refill  . amoxicillin (AMOXIL) 500 MG capsule Take 500 mg by mouth as directed. 6 prior to dental work    . BENICAR 40 MG tablet TAKE 1 TABLET TWICE A DAY 180 tablet 3  . carvedilol (COREG) 12.5 MG tablet TAKE 1 TABLET TWICE A DAY WITH  MEALS 180 tablet 3  . cholecalciferol (VITAMIN D) 1000 units tablet Take 1,000 Units by mouth daily.    . Coenzyme Q10 200 MG TABS Take 1 tablet by mouth daily.    . Cranberry 200 MG CAPS Take 200 mg by mouth daily.     . empagliflozin (JARDIANCE) 25 MG TABS tablet Take 25 mg by mouth daily. 90 tablet 3  . ergocalciferol (VITAMIN D2) 50000 units capsule Take 50,000 Units by mouth once a week.    . ezetimibe (ZETIA)  10 MG tablet TAKE 1 TABLET DAILY 90 tablet 3  . fenofibrate (TRICOR) 48 MG tablet Take 1 tablet (48 mg total) by mouth daily. 90 tablet 3  . furosemide (LASIX) 40 MG tablet TAKE 1 TABLET DAILY 90 tablet 3  . insulin regular human CONCENTRATED (HUMULIN R) 500 UNIT/ML injection Inject up to 100 units in am, and up to 35 units with lunch and dinner 60 mL 3  . Insulin Syringe/Needle U-500 (BD INSULIN SYRINGE U-500) 31G X 6MM 0.5 ML MISC Inject 1 each into the skin 3 (three) times daily before meals. Use 3x a day 300 each 3  . KLOR-CON 10 10 MEQ tablet TAKE 2 TABLETS (20 MEQ TOTAL) TWICE A DAY 360 tablet 3  . Levomefolate Calcium POWD Take by mouth.    . metFORMIN (GLUCOPHAGE-XR) 500 MG 24 hr tablet Take 2000 mg by mouth with dinner 360 tablet 3  . montelukast (SINGULAIR) 10 MG tablet Take 10 mg by mouth at bedtime.    . Multiple Vitamins-Minerals (CENTRUM SILVER PO) Take 1 tablet by mouth daily.    . nitroGLYCERIN (NITROSTAT) 0.4 MG SL tablet Place 1 tablet (0.4 mg total) under the tongue every 5 (five) minutes as needed for chest pain. 25 tablet 3  . NON FORMULARY Methyfolate 2000 MCG daily    . omega-3 acid ethyl esters (LOVAZA) 1 g capsule Take 2 capsules (2 g total) by mouth 2 (two) times daily. 360 capsule 3  . pravastatin (PRAVACHOL) 80 MG tablet TAKE 1 TABLET DAILY 90 tablet 3  . Probiotic Product (PROBIOTIC DAILY PO) Take 1 tablet by mouth daily.    Marland Kitchen spironolactone (ALDACTONE) 25 MG tablet TAKE 1 TABLET DAILY 90 tablet 3  . XARELTO 20 MG TABS tablet TAKE 1 TABLET DAILY WITH  SUPPER 90 tablet 3   No current facility-administered medications on file prior to visit.    Allergies  Allergen Reactions  . Adhesive [Tape]     Rash   . Iodine     flushing  . Seasonal Ic [Cholestatin]   . Contrast Media [Iodinated Diagnostic Agents] Rash and Other (See Comments)    flushing EXTREME FLUSHING    Family History  Problem Relation Age of Onset  . Heart disease Unknown        No family history   PE: BP 132/62 (BP Location: Left Arm, Patient Position: Sitting)   Pulse 89   Wt (!) 331 lb (150.1 kg)   SpO2 94%   BMI 44.89 kg/m  Body mass index is 44.89 kg/m. Wt Readings from Last 3 Encounters:  05/06/17 (!) 331 lb (150.1 kg)  02/28/17 (!) 330 lb (149.7 kg)  02/17/17 (!) 334 lb (151.5 kg)   Constitutional: overweight, in NAD Eyes: PERRLA, EOMI, no exophthalmos ENT: moist mucous membranes, no thyromegaly, no cervical lymphadenopathy Cardiovascular: RRR, No MRG, mild B LE swelling >> wears compression hoses Respiratory: CTA B Gastrointestinal: abdomen soft, NT, ND, BS+ Musculoskeletal: no deformities, strength intact in all 4 Skin: moist, warm, no rashes Neurological: no tremor with outstretched hands, DTR normal in all 4  ASSESSMENT: 1. DM2, insulin-dependent, uncontrolled, with complications - CAD, s/p CABG 5x in 1997 (Dr Roxy Horseman), iCMP >> CHF (Dr Stanford Breed) - CKD stage 3   2. Obesity class 3  PLAN:  1. Patient with long-standing, Uncontrolled diabetes, very insulin resistant, on U500 insulin regimen, and now off his GLP-1 receptor agonist due to severe constipation. His constipation resolved after stopping Bydureon.   - At this visit, his  sugars are approximately the same as before, despite stopping the GLP-1 receptor agonist. His weight has not changed either. - His sugars were higher in August, since he has been out of town. He does admit that he ate erratically then. - At this visit, we discussed about continuing the same doses of mealtime insulin,  but I advised him to start using the sliding scale whenever this is needed, since now he is using it very rarely. - I suggested to:  Patient Instructions  Please continue: - Metformin ER 2000 mg with dinner - Jardiance 25 mg in am - U500 insulin: - 90-100 units before breakfast - 30-35 units before lunch  - 30-35 units before dinner  Please use the sliding scale consistently: - U500 sliding scale:  150-175: + 2 units 176-200: + 3 units 201-225: + 4 units 226-300: + 5 units 301-325: + 6 units >325: + 7 units  Use 500 sliding scale at bedtime if the sugars are >200.  Please return in 3 months with your sugar log.   - today, HbA1c is 7.5% (stable) - continue checking sugars at different times of the day - check 3x a day, rotating checks - advised for yearly eye exams >> he is UTD - Return to clinic in 3 mo with sugar log   2. Obesity class 3 - At last visit, we discussed about the benefits of gastric bypass surgery, and at this visit he tells me that he decided against it. - Unfortunately, we had to stop the GLP-1 receptor agonist, which may help controlling his weight, however, his weight did not increase since we stopped the medication   Philemon Kingdom, MD PhD [facility] Endocrinology

## 2017-05-14 ENCOUNTER — Other Ambulatory Visit: Payer: Self-pay | Admitting: Cardiology

## 2017-05-16 NOTE — Telephone Encounter (Signed)
REFILL 

## 2017-05-17 DIAGNOSIS — L821 Other seborrheic keratosis: Secondary | ICD-10-CM | POA: Diagnosis not present

## 2017-05-17 DIAGNOSIS — L408 Other psoriasis: Secondary | ICD-10-CM | POA: Diagnosis not present

## 2017-05-17 DIAGNOSIS — D1800 Hemangioma unspecified site: Secondary | ICD-10-CM | POA: Diagnosis not present

## 2017-05-17 DIAGNOSIS — L820 Inflamed seborrheic keratosis: Secondary | ICD-10-CM | POA: Diagnosis not present

## 2017-05-17 DIAGNOSIS — B078 Other viral warts: Secondary | ICD-10-CM | POA: Diagnosis not present

## 2017-05-25 ENCOUNTER — Ambulatory Visit (INDEPENDENT_AMBULATORY_CARE_PROVIDER_SITE_OTHER): Payer: Medicare Other | Admitting: *Deleted

## 2017-05-25 DIAGNOSIS — I255 Ischemic cardiomyopathy: Secondary | ICD-10-CM

## 2017-05-26 NOTE — Progress Notes (Signed)
Remote ICD transmission.   

## 2017-05-31 ENCOUNTER — Encounter: Payer: Self-pay | Admitting: Cardiology

## 2017-06-06 DIAGNOSIS — E1142 Type 2 diabetes mellitus with diabetic polyneuropathy: Secondary | ICD-10-CM | POA: Diagnosis not present

## 2017-06-06 DIAGNOSIS — M6701 Short Achilles tendon (acquired), right ankle: Secondary | ICD-10-CM | POA: Diagnosis not present

## 2017-06-06 DIAGNOSIS — L84 Corns and callosities: Secondary | ICD-10-CM | POA: Diagnosis not present

## 2017-06-06 DIAGNOSIS — M7741 Metatarsalgia, right foot: Secondary | ICD-10-CM | POA: Diagnosis not present

## 2017-06-06 DIAGNOSIS — M7742 Metatarsalgia, left foot: Secondary | ICD-10-CM | POA: Diagnosis not present

## 2017-06-06 DIAGNOSIS — M6702 Short Achilles tendon (acquired), left ankle: Secondary | ICD-10-CM | POA: Diagnosis not present

## 2017-06-15 ENCOUNTER — Other Ambulatory Visit: Payer: Self-pay | Admitting: Cardiology

## 2017-06-15 DIAGNOSIS — Z95810 Presence of automatic (implantable) cardiac defibrillator: Secondary | ICD-10-CM

## 2017-06-15 DIAGNOSIS — I480 Paroxysmal atrial fibrillation: Secondary | ICD-10-CM

## 2017-06-15 DIAGNOSIS — I2510 Atherosclerotic heart disease of native coronary artery without angina pectoris: Secondary | ICD-10-CM

## 2017-06-15 DIAGNOSIS — I10 Essential (primary) hypertension: Secondary | ICD-10-CM

## 2017-06-15 DIAGNOSIS — I2583 Coronary atherosclerosis due to lipid rich plaque: Secondary | ICD-10-CM

## 2017-06-15 DIAGNOSIS — E785 Hyperlipidemia, unspecified: Secondary | ICD-10-CM

## 2017-06-15 DIAGNOSIS — I255 Ischemic cardiomyopathy: Secondary | ICD-10-CM

## 2017-06-15 LAB — CUP PACEART REMOTE DEVICE CHECK
Battery Remaining Longevity: 62 mo
Battery Remaining Percentage: 60 %
Battery Voltage: 2.95 V
Brady Statistic RV Percent Paced: 1 %
Date Time Interrogation Session: 20180905060015
HIGH POWER IMPEDANCE MEASURED VALUE: 81 Ohm
HighPow Impedance: 81 Ohm
Implantable Lead Implant Date: 20140320
Implantable Lead Implant Date: 20140320
Implantable Lead Location: 753860
Implantable Pulse Generator Implant Date: 20140320
Lead Channel Impedance Value: 410 Ohm
Lead Channel Pacing Threshold Amplitude: 0.75 V
Lead Channel Pacing Threshold Pulse Width: 0.5 ms
Lead Channel Sensing Intrinsic Amplitude: 12 mV
Lead Channel Setting Sensing Sensitivity: 0.5 mV
MDC IDC LEAD LOCATION: 753859
MDC IDC MSMT LEADCHNL RA IMPEDANCE VALUE: 390 Ohm
MDC IDC MSMT LEADCHNL RA SENSING INTR AMPL: 2.7 mV
MDC IDC PG SERIAL: 7067730
MDC IDC SET LEADCHNL RV PACING AMPLITUDE: 2 V
MDC IDC SET LEADCHNL RV PACING PULSEWIDTH: 0.5 ms

## 2017-06-20 DIAGNOSIS — H40003 Preglaucoma, unspecified, bilateral: Secondary | ICD-10-CM | POA: Diagnosis not present

## 2017-06-23 DIAGNOSIS — I10 Essential (primary) hypertension: Secondary | ICD-10-CM | POA: Diagnosis not present

## 2017-06-23 DIAGNOSIS — E1122 Type 2 diabetes mellitus with diabetic chronic kidney disease: Secondary | ICD-10-CM | POA: Diagnosis not present

## 2017-06-23 DIAGNOSIS — N183 Chronic kidney disease, stage 3 (moderate): Secondary | ICD-10-CM | POA: Diagnosis not present

## 2017-06-23 DIAGNOSIS — E7800 Pure hypercholesterolemia, unspecified: Secondary | ICD-10-CM | POA: Diagnosis not present

## 2017-06-23 DIAGNOSIS — Z794 Long term (current) use of insulin: Secondary | ICD-10-CM | POA: Diagnosis not present

## 2017-06-23 DIAGNOSIS — Z23 Encounter for immunization: Secondary | ICD-10-CM | POA: Diagnosis not present

## 2017-06-23 DIAGNOSIS — G4733 Obstructive sleep apnea (adult) (pediatric): Secondary | ICD-10-CM | POA: Diagnosis not present

## 2017-06-30 DIAGNOSIS — E1142 Type 2 diabetes mellitus with diabetic polyneuropathy: Secondary | ICD-10-CM | POA: Diagnosis not present

## 2017-06-30 DIAGNOSIS — M7741 Metatarsalgia, right foot: Secondary | ICD-10-CM | POA: Diagnosis not present

## 2017-06-30 DIAGNOSIS — L570 Actinic keratosis: Secondary | ICD-10-CM | POA: Diagnosis not present

## 2017-06-30 DIAGNOSIS — M7742 Metatarsalgia, left foot: Secondary | ICD-10-CM | POA: Diagnosis not present

## 2017-06-30 DIAGNOSIS — M6701 Short Achilles tendon (acquired), right ankle: Secondary | ICD-10-CM | POA: Diagnosis not present

## 2017-06-30 DIAGNOSIS — M6702 Short Achilles tendon (acquired), left ankle: Secondary | ICD-10-CM | POA: Diagnosis not present

## 2017-06-30 DIAGNOSIS — L84 Corns and callosities: Secondary | ICD-10-CM | POA: Diagnosis not present

## 2017-06-30 DIAGNOSIS — L97511 Non-pressure chronic ulcer of other part of right foot limited to breakdown of skin: Secondary | ICD-10-CM | POA: Diagnosis not present

## 2017-07-14 DIAGNOSIS — L97511 Non-pressure chronic ulcer of other part of right foot limited to breakdown of skin: Secondary | ICD-10-CM | POA: Diagnosis not present

## 2017-07-14 DIAGNOSIS — Z951 Presence of aortocoronary bypass graft: Secondary | ICD-10-CM | POA: Diagnosis not present

## 2017-07-14 DIAGNOSIS — Z794 Long term (current) use of insulin: Secondary | ICD-10-CM | POA: Diagnosis not present

## 2017-07-14 DIAGNOSIS — E11621 Type 2 diabetes mellitus with foot ulcer: Secondary | ICD-10-CM | POA: Diagnosis not present

## 2017-07-14 DIAGNOSIS — I890 Lymphedema, not elsewhere classified: Secondary | ICD-10-CM | POA: Diagnosis not present

## 2017-07-14 DIAGNOSIS — E08621 Diabetes mellitus due to underlying condition with foot ulcer: Secondary | ICD-10-CM | POA: Diagnosis not present

## 2017-07-14 DIAGNOSIS — M7741 Metatarsalgia, right foot: Secondary | ICD-10-CM | POA: Diagnosis not present

## 2017-07-14 DIAGNOSIS — E1142 Type 2 diabetes mellitus with diabetic polyneuropathy: Secondary | ICD-10-CM | POA: Diagnosis not present

## 2017-07-14 DIAGNOSIS — E1122 Type 2 diabetes mellitus with diabetic chronic kidney disease: Secondary | ICD-10-CM | POA: Diagnosis not present

## 2017-07-14 DIAGNOSIS — Z7901 Long term (current) use of anticoagulants: Secondary | ICD-10-CM | POA: Diagnosis not present

## 2017-07-14 DIAGNOSIS — I2510 Atherosclerotic heart disease of native coronary artery without angina pectoris: Secondary | ICD-10-CM | POA: Diagnosis not present

## 2017-07-14 DIAGNOSIS — I1310 Hypertensive heart and chronic kidney disease without heart failure, with stage 1 through stage 4 chronic kidney disease, or unspecified chronic kidney disease: Secondary | ICD-10-CM | POA: Diagnosis not present

## 2017-07-14 DIAGNOSIS — I4891 Unspecified atrial fibrillation: Secondary | ICD-10-CM | POA: Diagnosis not present

## 2017-07-14 DIAGNOSIS — Z79899 Other long term (current) drug therapy: Secondary | ICD-10-CM | POA: Diagnosis not present

## 2017-07-14 DIAGNOSIS — N189 Chronic kidney disease, unspecified: Secondary | ICD-10-CM | POA: Diagnosis not present

## 2017-07-19 NOTE — Progress Notes (Signed)
HPI: FU coronary artery disease. Patient is status post coronary artery bypassing graft in 1997. Patient had a LIMA to the LAD, saphenous vein graft to the diagonal, saphenous vein graft to the circumflex and sequential saphenous vein graft to the PDA and posterior lateral. His procedure was performed at [facility]. He subsequently moved to [facility]. Echocardiogram in 2005 showed an ejection fraction of 38%, restrictive filling and trace tricuspid regurgitation. Patient had ICD placed previously. Also with h/o atrial fibrillation. Nuclear study 12/16 showed EF 50; prior inferior MI with mild peri-infarct ischemia and moderate ischemia in the anterolateral wall. Cardiac catheterization January 2017 showed severe three-vessel coronary disease. There was total occlusion of the saphenous vein graft to the obtuse marginal, total occlusion of the saphenous vein graft to diagonal and there was a 50-70% stenosis in the sequential vein graft to the PDA and left ventricular branch and the LIMA to the LAD was patent. Ejection fraction 35-45%. Medical therapy recommended. Abdominal ultrasound August 2017 showed no aneurysm. Since last seen, the patient has dyspnea with more extreme activities but not with routine activities. It is relieved with rest. It is not associated with chest pain. There is no orthopnea, PND. There is no syncope or palpitations. There is no exertional chest pain. Mild pedal edema.   Current Outpatient Medications  Medication Sig Dispense Refill  . amoxicillin (AMOXIL) 500 MG capsule Take 500 mg by mouth as directed. 6 prior to dental work    . BENICAR 40 MG tablet TAKE 1 TABLET TWICE A DAY 180 tablet 3  . carvedilol (COREG) 12.5 MG tablet TAKE 1 TABLET TWICE A DAY WITH MEALS 180 tablet 3  . cholecalciferol (VITAMIN D) 1000 units tablet Take 1,000 Units by mouth daily.    . Coenzyme Q10 200 MG TABS Take 1 tablet by mouth daily.    . Cranberry 200 MG CAPS Take 200 mg by mouth  daily.     . empagliflozin (JARDIANCE) 25 MG TABS tablet Take 25 mg by mouth daily. 90 tablet 3  . ergocalciferol (VITAMIN D2) 50000 units capsule Take 50,000 Units by mouth once a week.    . ezetimibe (ZETIA) 10 MG tablet TAKE 1 TABLET DAILY 90 tablet 3  . furosemide (LASIX) 40 MG tablet TAKE 1 TABLET DAILY 90 tablet 3  . insulin regular human CONCENTRATED (HUMULIN R) 500 UNIT/ML injection Inject up to 100 units in am, and up to 35 units with lunch and dinner 60 mL 3  . Insulin Syringe/Needle U-500 (BD INSULIN SYRINGE U-500) 31G X 6MM 0.5 ML MISC Inject 1 each into the skin 3 (three) times daily before meals. Use 3x a day 300 each 3  . KLOR-CON 10 10 MEQ tablet TAKE 2 TABLETS (20 MEQ TOTAL) TWICE A DAY 360 tablet 3  . Levomefolate Calcium POWD Take by mouth.    . metFORMIN (GLUCOPHAGE-XR) 500 MG 24 hr tablet Take 2000 mg by mouth with dinner 360 tablet 3  . montelukast (SINGULAIR) 10 MG tablet Take 10 mg by mouth at bedtime.    . Multiple Vitamins-Minerals (CENTRUM SILVER PO) Take 1 tablet by mouth daily.    . nitroGLYCERIN (NITROSTAT) 0.4 MG SL tablet Place 1 tablet (0.4 mg total) under the tongue every 5 (five) minutes as needed for chest pain. 25 tablet 3  . NON FORMULARY Methyfolate 2000 MCG daily    . omega-3 acid ethyl esters (LOVAZA) 1 g capsule TAKE 2 CAPSULES TWICE A DAY 360 capsule 0  .  pravastatin (PRAVACHOL) 80 MG tablet TAKE 1 TABLET DAILY 90 tablet 3  . Probiotic Product (PROBIOTIC DAILY PO) Take 1 tablet by mouth daily.    Marland Kitchen spironolactone (ALDACTONE) 25 MG tablet TAKE 1 TABLET DAILY 90 tablet 3  . TRICOR 48 MG tablet TAKE 1 TABLET DAILY 90 tablet 2  . XARELTO 20 MG TABS tablet TAKE 1 TABLET DAILY WITH SUPPER 90 tablet 3   No current facility-administered medications for this visit.      Past Medical History:  Diagnosis Date  . Arthritis   . Atrial fibrillation ([place])    takes Xarelto daily  . CAD (coronary artery disease)   . Cardiomyopathy, ischemic 11/07/2013  .  Cholelithiasis   . Chronic back pain    scoliosis/arthritis  . Complication of anesthesia    forgetful for about 2-3wks after anesthesia  . Congestive heart failure ([place])    takes Spironolactone daily  . Diabetes mellitus ([place])    takes Invokamet daily as well as Hum U  . History of colon polyps   . History of kidney stones   . Hyperlipidemia    takes San[employer]-SCI daily  . Hyperlipidemia 11/07/2013  . Hypertension    takes Benicar and Coreg daily  . ICD (implantable cardioverter-defibrillator) battery depletion   . ICD (implantable cardioverter-defibrillator) in place 11/07/2013  . Ischemic cardiomyopathy   . Ischemic cardiomyopathy   . Joint pain   . Myocardial infarction ([place]) 1997   on the OR table   . Nephrolithiasis   . Obstructive sleep apnea   . Peripheral neuropathy   . Pneumonia    as a child  . Spinal stenosis   . Thrombocytopenia ([place])   . Total knee replacement status 11/29/2014  . Type 2 diabetes mellitus with stage 3 chronic kidney disease ([place]) 05/19/2015  . Urinary frequency   . Urinary urgency     Past Surgical History:  Procedure Laterality Date  . COLONOSCOPY    . coronary artery bypass and graft  1997   x 5  . ESOPHAGOGASTRODUODENOSCOPY    . ICD placed    . LAMINECTOMY    . LITHOTRIPSY    . Right rotator cuff surgery    . TONSILLECTOMY     adenoids  . TOTAL KNEE ARTHROPLASTY Left     Social History   Socioeconomic History  . Marital status: Married    Spouse name: Not on file  . Number of children: Not on file  . Years of education: Not on file  . Highest education level: Not on file  Social Needs  . Financial resource strain: Not on file  . Food insecurity - worry: Not on file  . Food insecurity - inability: Not on file  . Transportation needs - medical: Not on file  . Transportation needs - non-medical: Not on file  Occupational History    Comment: Retired from [employer]  . Smoking status: Never  Smoker  . Smokeless tobacco: Never Used  Substance and Sexual Activity  . Alcohol use: Yes    Alcohol/week: 0.0 oz    Comment: once drink a month  . Drug use: No  . Sexual activity: Not on file  Other Topics Concern  . Not on file  Social History Narrative  . Not on file    Family History  Problem Relation Age of Onset  . Heart disease Unknown        No family history    ROS: no fevers or  chills, productive cough, hemoptysis, dysphasia, odynophagia, melena, hematochezia, dysuria, hematuria, rash, seizure activity, orthopnea, PND, claudication. Remaining systems are negative.  Physical Exam: Well-developed obese in no acute distress.  Skin is warm and dry.  HEENT is normal.  Neck is supple.  Chest is clear to auscultation with normal expansion.  Cardiovascular exam is regular rate and rhythm.  Abdominal exam nontender or distended. No masses palpated. Extremities show 1+ edema. neuro grossly intact   A/P  1 permanent atrial fibrillation-continue beta blocker for rate control. Continue xarelto. Laboratories from October 2018 reviewed. Creatinine 1.59. Hemoglobin 14.3.  2 coronary artery disease-continue statin. No aspirin given need for anticoagulation.  3 hypertension-blood pressure is controlled. Continue present medications.  4 hyperlipidemia-continue statin. Check lipids. He has not tolerated high-dose statins in the past. Recent liver functionsfrom October 2018 reviewed. Alkaline phosphatase 58, SGOT 46, SGPT 46.  5 ischemic cardiomyopathy-continue ARB and beta blocker.  6 chronic combined systolic/Diastolic congestive heart failure-check potassium and renal function. He is euvolemic. Continue present dose of diuretics. Continue low sodium diet and fluid restriction.  7 ICD-followed by electrophysiology.  8 morbid obesity-patient needs weight loss.  Kirk Ruths, MD

## 2017-07-29 DIAGNOSIS — I872 Venous insufficiency (chronic) (peripheral): Secondary | ICD-10-CM | POA: Diagnosis not present

## 2017-07-29 DIAGNOSIS — E08621 Diabetes mellitus due to underlying condition with foot ulcer: Secondary | ICD-10-CM | POA: Diagnosis not present

## 2017-07-29 DIAGNOSIS — L97511 Non-pressure chronic ulcer of other part of right foot limited to breakdown of skin: Secondary | ICD-10-CM | POA: Diagnosis not present

## 2017-07-29 DIAGNOSIS — I890 Lymphedema, not elsewhere classified: Secondary | ICD-10-CM | POA: Diagnosis not present

## 2017-08-01 ENCOUNTER — Ambulatory Visit (INDEPENDENT_AMBULATORY_CARE_PROVIDER_SITE_OTHER): Payer: Medicare Other | Admitting: Cardiology

## 2017-08-01 ENCOUNTER — Encounter: Payer: Self-pay | Admitting: Cardiology

## 2017-08-01 VITALS — BP 134/62 | HR 84 | Ht 73.0 in | Wt 270.0 lb

## 2017-08-01 DIAGNOSIS — I10 Essential (primary) hypertension: Secondary | ICD-10-CM | POA: Diagnosis not present

## 2017-08-01 DIAGNOSIS — I255 Ischemic cardiomyopathy: Secondary | ICD-10-CM

## 2017-08-01 DIAGNOSIS — I2510 Atherosclerotic heart disease of native coronary artery without angina pectoris: Secondary | ICD-10-CM | POA: Diagnosis not present

## 2017-08-01 DIAGNOSIS — I4821 Permanent atrial fibrillation: Secondary | ICD-10-CM

## 2017-08-01 DIAGNOSIS — E7800 Pure hypercholesterolemia, unspecified: Secondary | ICD-10-CM | POA: Diagnosis not present

## 2017-08-01 DIAGNOSIS — I482 Chronic atrial fibrillation: Secondary | ICD-10-CM

## 2017-08-01 NOTE — Patient Instructions (Signed)
Medication Instructions:   NO CHANGE  Labwork:  Your physician recommends that you return for lab work WHEN FASTING  Follow-Up:  Your physician wants you to follow-up in: [place] will receive a reminder letter in the mail two months in advance. If you don't receive a letter, please call our office to schedule the follow-up appointment.   If you need a refill on your cardiac medications before your next appointment, please call your pharmacy.

## 2017-08-02 ENCOUNTER — Encounter: Payer: Self-pay | Admitting: Cardiology

## 2017-08-03 DIAGNOSIS — E7800 Pure hypercholesterolemia, unspecified: Secondary | ICD-10-CM | POA: Diagnosis not present

## 2017-08-04 LAB — LIPID PANEL
CHOLESTEROL TOTAL: 123 mg/dL (ref 100–199)
Chol/HDL Ratio: 4.7 ratio (ref 0.0–5.0)
HDL: 26 mg/dL — AB (ref >39)
LDL CALC: 26 mg/dL (ref 0–99)
Triglycerides: 355 mg/dL — ABNORMAL HIGH (ref 0–149)
VLDL CHOLESTEROL CAL: 71 mg/dL — AB (ref 5–40)

## 2017-08-05 ENCOUNTER — Ambulatory Visit (INDEPENDENT_AMBULATORY_CARE_PROVIDER_SITE_OTHER): Payer: Medicare Other | Admitting: Internal Medicine

## 2017-08-05 ENCOUNTER — Encounter: Payer: Self-pay | Admitting: Internal Medicine

## 2017-08-05 VITALS — BP 128/60 | HR 90 | Temp 98.0°F | Ht 73.0 in | Wt 333.0 lb

## 2017-08-05 DIAGNOSIS — I255 Ischemic cardiomyopathy: Secondary | ICD-10-CM | POA: Diagnosis not present

## 2017-08-05 DIAGNOSIS — N189 Chronic kidney disease, unspecified: Secondary | ICD-10-CM | POA: Diagnosis not present

## 2017-08-05 DIAGNOSIS — N183 Chronic kidney disease, stage 3 (moderate): Secondary | ICD-10-CM

## 2017-08-05 DIAGNOSIS — Z951 Presence of aortocoronary bypass graft: Secondary | ICD-10-CM | POA: Diagnosis not present

## 2017-08-05 DIAGNOSIS — I4891 Unspecified atrial fibrillation: Secondary | ICD-10-CM | POA: Diagnosis not present

## 2017-08-05 DIAGNOSIS — I890 Lymphedema, not elsewhere classified: Secondary | ICD-10-CM | POA: Diagnosis not present

## 2017-08-05 DIAGNOSIS — Z9889 Other specified postprocedural states: Secondary | ICD-10-CM | POA: Diagnosis not present

## 2017-08-05 DIAGNOSIS — L97511 Non-pressure chronic ulcer of other part of right foot limited to breakdown of skin: Secondary | ICD-10-CM | POA: Diagnosis not present

## 2017-08-05 DIAGNOSIS — E11621 Type 2 diabetes mellitus with foot ulcer: Secondary | ICD-10-CM | POA: Diagnosis not present

## 2017-08-05 DIAGNOSIS — E1122 Type 2 diabetes mellitus with diabetic chronic kidney disease: Secondary | ICD-10-CM

## 2017-08-05 DIAGNOSIS — Z794 Long term (current) use of insulin: Secondary | ICD-10-CM

## 2017-08-05 DIAGNOSIS — I2510 Atherosclerotic heart disease of native coronary artery without angina pectoris: Secondary | ICD-10-CM | POA: Diagnosis not present

## 2017-08-05 DIAGNOSIS — L84 Corns and callosities: Secondary | ICD-10-CM | POA: Diagnosis not present

## 2017-08-05 DIAGNOSIS — I129 Hypertensive chronic kidney disease with stage 1 through stage 4 chronic kidney disease, or unspecified chronic kidney disease: Secondary | ICD-10-CM | POA: Diagnosis not present

## 2017-08-05 DIAGNOSIS — E08621 Diabetes mellitus due to underlying condition with foot ulcer: Secondary | ICD-10-CM | POA: Diagnosis not present

## 2017-08-05 DIAGNOSIS — Z7901 Long term (current) use of anticoagulants: Secondary | ICD-10-CM | POA: Diagnosis not present

## 2017-08-05 LAB — POCT GLYCOSYLATED HEMOGLOBIN (HGB A1C): Hemoglobin A1C: 7.4

## 2017-08-05 MED ORDER — INSULIN REGULAR HUMAN (CONC) 500 UNIT/ML ~~LOC~~ SOLN: SUBCUTANEOUS | 3 refills | Status: DC

## 2017-08-05 NOTE — Progress Notes (Signed)
Patient ID: Robert Gregory, male   DOB: Nov 04, 1940, 76 y.o.   MRN: [id number]  HPI: Robert Gregory is a 76 y.o.-year-old male, returning for f/u for DM2, dx in 2004, insulin-dependent, uncontrolled, with complications (CAD - Ischemic CMP, s/p CABG x5 in 1997, CHF; CKD stage 3). Last visit 3 mo ago.  He has a R diabetic foot ulcer developed after removal of callus >> followed by Robert Gregory ([place]). This is healing well.  Last hemoglobin A1c was: Lab Results  Component Value Date   HGBA1C 7.4 08/05/2017   HGBA1C 7.5 05/06/2017   HGBA1C 7.6 01/12/2017   Pt is on a regimen of: - Metformin ER 2000 mg with dinner - Jardiance 25 mg in am - U500 insulin: - 90-100 units before breakfast - 35-45 units before lunch  - 35-45 units before dinner - U500 sliding scale: added this back at last visit 150-175: + 2 units 176-200: + 3 units 201-225: + 4 units 226-300: + 5 units 301-325: + 6 units >325: + 7 units Stopped Cinnamon 500 mg 2x a day He gained 50 lbs with Actos in the past. Would not wan to restart. He was on Invokana 100 mg in am We stopped Bydureon at last visit 2/2 severe constipation >> this resolved after stopping the med.  Pt checks his sugars 3x a day >> reviewed log: - am:153-207, 232 >> 157-228 >> 149-214, 234 - 2h after b'fast:  191 >> n/c >> 194 >> 216 - before lunch:  170, 175, 253 >> 168-274 >> 147-261 - 2h after lunch: 161, 205 >> 145-185 >> 185-268 >> 191-314 - before dinner: 98-229 >> 73, 83-180, 234 >> 84-204, 232 - 2h after dinner:131-238 >> 104-224, 243 >> 100-287 - bedtime:  126-289 >> 113-256 >> 88-272 - nighttime: 154-284 >> 113-172 >> 116-162 Lowest sugar: 73 >> 88; he has hypoglycemia awareness at 70.  Highest sugar:289 >> 274 >> 321  Glucometer: [employer] 2  Pt's meals are: - Breakfast: cereals + 2% milk, sausage + eggs + toast; still OJ - Lunch: soup + sandwich (lightest) - Dinner: meat + veggies + starch  - Snacks: 1- pm: peanuts, V8 juice  - +  mild CKD, last BUN/creatinine:  Lab Results  Component Value Date   BUN 30 (H) 02/28/2017   CREATININE 1.3 (H) 02/28/2017  On Benicar 80 mg daily.  GFR: Lab Results  Component Value Date   GFRNONAA 61 (L) 12/01/2014   GFRNONAA 43 (L) 11/30/2014   GFRNONAA 49 (L) 11/19/2014   GFRNONAA 56 (L) 09/18/2014   GFRNONAA 37 (L) 09/11/2014   GFRNONAA 46 (L) 09/03/2014   GFRNONAA 56 (L) 07/31/2014   GFRNONAA 60 11/14/2013   - + HL;  last set of lipids: Lab Results  Component Value Date   CHOL 123 08/03/2017   HDL 26 (L) 08/03/2017   LDLCALC 26 08/03/2017   TRIG 355 (H) 08/03/2017   CHOLHDL 4.7 08/03/2017  On Pravastatin 80. - last eye exam was in 12/2016 >> No DR - no numbness and tingling in his feet.  ROS: Constitutional: no weight gain/no weight loss, no fatigue, no subjective hyperthermia, no subjective hypothermia Eyes: no blurry vision, no xerophthalmia ENT: no sore throat, no nodules palpated in throat, no dysphagia, no odynophagia, no hoarseness Cardiovascular: no CP/no SOB/no palpitations/no leg swelling Respiratory: no cough/no SOB/no wheezing Gastrointestinal: no N/no V/no D/no C/no acid reflux Musculoskeletal: no muscle aches/no joint aches Skin: no rashes, no hair loss, + had pain  with ulcer (R heel)  >> now resolved (healing) Neurological: no tremors/no numbness/no tingling/no dizziness  I reviewed pt's medications, allergies, PMH, social hx, family hx, and changes were documented in the history of present illness. Otherwise, unchanged from my initial visit note.  Past Medical History:  Diagnosis Date  . Arthritis   . Atrial fibrillation ([place])    takes Xarelto daily  . CAD (coronary artery disease)   . Cardiomyopathy, ischemic 11/07/2013  . Cholelithiasis   . Chronic back pain    scoliosis/arthritis  . Complication of anesthesia    forgetful for about 2-3wks after anesthesia  . Congestive heart failure ([place])    takes Spironolactone daily  . Diabetes  mellitus ([place])    takes Invokamet daily as well as Hum U  . History of colon polyps   . History of kidney stones   . Hyperlipidemia    takes San[employer]-SCI daily  . Hyperlipidemia 11/07/2013  . Hypertension    takes Benicar and Coreg daily  . ICD (implantable cardioverter-defibrillator) battery depletion   . ICD (implantable cardioverter-defibrillator) in place 11/07/2013  . Ischemic cardiomyopathy   . Ischemic cardiomyopathy   . Joint pain   . Myocardial infarction ([place]) 1997   on the OR table   . Nephrolithiasis   . Obstructive sleep apnea   . Peripheral neuropathy   . Pneumonia    as a child  . Spinal stenosis   . Thrombocytopenia ([place])   . Total knee replacement status 11/29/2014  . Type 2 diabetes mellitus with stage 3 chronic kidney disease ([place]) 05/19/2015  . Urinary frequency   . Urinary urgency    Past Surgical History:  Procedure Laterality Date  . COLONOSCOPY    . coronary artery bypass and graft  1997   x 5  . ESOPHAGOGASTRODUODENOSCOPY    . ICD placed    . LAMINECTOMY    . Left Heart Cath and Cors/Grafts Angiography N/A 10/02/2015   Performed by Robert Gregory at [facility]  . LITHOTRIPSY    . Right rotator cuff surgery    . RIGHT TOTAL KNEE ARTHROPLASTY Right 11/29/2014   Per[facility] by Robert Koyanagi, [facility]  . TONSILLECTOMY     adenoids  . TOTAL KNEE ARTHROPLASTY Left    Social History   Social History  . Marital status: Married    Spouse name: N/A  . Number of children: N/A   Occupational History  .      Retired from [employer]  . Smoking status: Never Smoker  . Smokeless tobacco: Never Used  . Alcohol use 0.0 oz/week     Comment: once drink a month  . Drug use: No   Current Outpatient Medications on File Prior to Visit  Medication Sig Dispense Refill  . amoxicillin (AMOXIL) 500 MG capsule Take 500 mg by mouth as directed. 6 prior to dental work    . BENICAR 40 MG tablet TAKE 1  TABLET TWICE A DAY 180 tablet 3  . carvedilol (COREG) 12.5 MG tablet TAKE 1 TABLET TWICE A DAY WITH MEALS 180 tablet 3  . cholecalciferol (VITAMIN D) 1000 units tablet Take 1,000 Units by mouth daily.    . Coenzyme Q10 200 MG TABS Take 1 tablet by mouth daily.    . Cranberry 200 MG CAPS Take 200 mg by mouth daily.     . empagliflozin (JARDIANCE) 25 MG TABS tablet Take 25 mg by  mouth daily. 90 tablet 3  . ergocalciferol (VITAMIN D2) 50000 units capsule Take 50,000 Units by mouth once a week.    . ezetimibe (ZETIA) 10 MG tablet TAKE 1 TABLET DAILY 90 tablet 3  . furosemide (LASIX) 40 MG tablet TAKE 1 TABLET DAILY 90 tablet 3  . insulin regular human CONCENTRATED (HUMULIN R) 500 UNIT/ML injection Inject up to 100 units in am, and up to 35 units with lunch and dinner 60 mL 3  . Insulin Syringe/Needle U-500 (BD INSULIN SYRINGE U-500) 31G X 6MM 0.5 ML MISC Inject 1 each into the skin 3 (three) times daily before meals. Use 3x a day 300 each 3  . KLOR-CON 10 10 MEQ tablet TAKE 2 TABLETS (20 MEQ TOTAL) TWICE A DAY 360 tablet 3  . Levomefolate Calcium POWD Take by mouth.    . metFORMIN (GLUCOPHAGE-XR) 500 MG 24 hr tablet Take 2000 mg by mouth with dinner 360 tablet 3  . montelukast (SINGULAIR) 10 MG tablet Take 10 mg by mouth at bedtime.    . Multiple Vitamins-Minerals (CENTRUM SILVER PO) Take 1 tablet by mouth daily.    . nitroGLYCERIN (NITROSTAT) 0.4 MG SL tablet Place 1 tablet (0.4 mg total) under the tongue every 5 (five) minutes as needed for chest pain. 25 tablet 3  . NON FORMULARY Methyfolate 2000 MCG daily    . omega-3 acid ethyl esters (LOVAZA) 1 g capsule TAKE 2 CAPSULES TWICE A DAY 360 capsule 0  . pravastatin (PRAVACHOL) 80 MG tablet TAKE 1 TABLET DAILY 90 tablet 3  . Probiotic Product (PROBIOTIC DAILY PO) Take 1 tablet by mouth daily.    Marland Kitchen spironolactone (ALDACTONE) 25 MG tablet TAKE 1 TABLET DAILY 90 tablet 3  . TRICOR 48 MG tablet TAKE 1 TABLET DAILY 90 tablet 2  . XARELTO 20 MG TABS  tablet TAKE 1 TABLET DAILY WITH SUPPER 90 tablet 3   No current facility-administered medications on file prior to visit.    Allergies  Allergen Reactions  . Adhesive [Tape]     Rash   . Iodine     flushing  . Seasonal Ic [Cholestatin]   . Contrast Media [Iodinated Diagnostic Agents] Rash and Other (See Comments)    flushing EXTREME FLUSHING    Family History  Problem Relation Age of Onset  . Heart disease Unknown        No family history   PE: BP 128/60   Pulse 90   Temp 98 F (36.7 C) (Oral)   Ht 6' 1"  (1.854 m)   Wt (!) 333 lb (151 kg)   SpO2 96%   BMI 43.93 kg/m  Body mass index is 43.93 kg/m. Wt Readings from Last 3 Encounters:  08/05/17 (!) 333 lb (151 kg)  08/01/17 270 lb (122.5 kg)  05/06/17 (!) 331 lb (150.1 kg)   Constitutional: obese, in NAD Eyes: PERRLA, EOMI, no exophthalmos ENT: moist mucous membranes, no thyromegaly, no cervical lymphadenopathy Cardiovascular: RRR, No MRG, + B LE swellign >> wears compression hoses Respiratory: CTA B Gastrointestinal: abdomen soft, NT, ND, BS+ Musculoskeletal: no deformities, strength intact in all 4 Skin: moist, warm, no rashes Neurological: no tremor with outstretched hands, DTR normal in all 4  ASSESSMENT: 1. DM2, insulin-dependent, uncontrolled, with complications - CAD, s/p CABG 5x in 1997 (Dr Roxy Horseman), iCMP >> CHF (Dr Stanford Breed) - CKD stage 3   2. Obesity class 3  PLAN:  1. Patient with long-standing, uncontrolled DM, very insulin resistant, on U500 insulin regimen, Metformin and  SGL2 inhibitor, with slightly better sugars at this visit, but with still fluctuating sugars: 80s-300s.  - he and his wife eat out a lot, almost every lunch >> sugars higher after this meal, but they improve some before dinner, and may have sugars <100 then >> we cannot increase the U500 before lunch. Also, he has high sugars in am but sugars at bedtime are not so high >> we cannot add U500 at bedtime otr increase insulin before  dinner. - I advised him to continue current regimen for now, but strongly encouraged him to work on diet - I suggested to:  Patient Instructions  Please continue: - Metformin ER 2000 mg with dinner - Jardiance 25 mg in am - U500 insulin: - 90-100 units before breakfast - 30-35 units before lunch  - 30-35 units before dinner - U500 sliding scale:  150-175: + 2 units 176-200: + 3 units 201-225: + 4 units 226-300: + 5 units 301-325: + 6 units >325: + 7 units  Please return in 3 months with your sugar log.   - today, HbA1c is 7.4% (slightly lower) - continue checking sugars at different times of the day - check 3x a day, rotating checks - advised for yearly eye exams >> he is UTD - Return to clinic in 3 mo with sugar log   2. Obesity class 3 - did not lose weight (actually gained some weight) since last visit - we discussed about the benefits of gastric bypass surgery in the past >> refused - we could not continue GLP-1 receptor agonist, which may help controlling his weight >> b/c constipation - will continue Jardiance which should also help with weight loss - discussed about the importance of changing diet  Philemon Kingdom, MD PhD [facility] Endocrinology

## 2017-08-05 NOTE — Progress Notes (Signed)
Pre visit review using our clinic review tool, if applicable. No additional management support is needed unless otherwise documented below in the visit note. 

## 2017-08-05 NOTE — Patient Instructions (Addendum)
Please continue: - Metformin ER 2000 mg with dinner - Jardiance 25 mg in am - U500 insulin: - 90-100 units before breakfast - 30-35 units before lunch  - 30-35 units before dinner - U500 sliding scale:  150-175: + 2 units 176-200: + 3 units 201-225: + 4 units 226-300: + 5 units 301-325: + 6 units >325: + 7 units  Please return in 3 months with your sugar log.

## 2017-08-24 ENCOUNTER — Ambulatory Visit (INDEPENDENT_AMBULATORY_CARE_PROVIDER_SITE_OTHER): Payer: Medicare Other | Admitting: *Deleted

## 2017-08-24 DIAGNOSIS — I255 Ischemic cardiomyopathy: Secondary | ICD-10-CM

## 2017-08-24 NOTE — Progress Notes (Signed)
Remote ICD transmission.   

## 2017-08-25 ENCOUNTER — Encounter: Payer: Self-pay | Admitting: Cardiology

## 2017-08-25 LAB — CUP PACEART REMOTE DEVICE CHECK
Battery Remaining Longevity: 61 mo
HighPow Impedance: 83 Ohm
HighPow Impedance: 83 Ohm
Implantable Lead Implant Date: 20140320
Implantable Lead Location: 753860
Lead Channel Impedance Value: 390 Ohm
Lead Channel Pacing Threshold Pulse Width: 0.5 ms
Lead Channel Sensing Intrinsic Amplitude: 2.7 mV
Lead Channel Setting Pacing Amplitude: 2 V
Lead Channel Setting Pacing Pulse Width: 0.5 ms
MDC IDC LEAD IMPLANT DT: 20140320
MDC IDC LEAD LOCATION: 753859
MDC IDC MSMT BATTERY REMAINING PERCENTAGE: 59 %
MDC IDC MSMT BATTERY VOLTAGE: 2.95 V
MDC IDC MSMT LEADCHNL RV IMPEDANCE VALUE: 430 Ohm
MDC IDC MSMT LEADCHNL RV PACING THRESHOLD AMPLITUDE: 0.75 V
MDC IDC MSMT LEADCHNL RV SENSING INTR AMPL: 12 mV
MDC IDC PG IMPLANT DT: 20140320
MDC IDC PG SERIAL: 7067730
MDC IDC SESS DTM: 20181205070016
MDC IDC SET LEADCHNL RV SENSING SENSITIVITY: 0.5 mV
MDC IDC STAT BRADY RV PERCENT PACED: 1 %

## 2017-08-29 ENCOUNTER — Ambulatory Visit: Payer: Medicare Other | Admitting: Hematology & Oncology

## 2017-08-29 ENCOUNTER — Other Ambulatory Visit: Payer: Medicare Other

## 2017-08-29 ENCOUNTER — Ambulatory Visit (HOSPITAL_BASED_OUTPATIENT_CLINIC_OR_DEPARTMENT_OTHER): Payer: Medicare Other

## 2017-08-30 ENCOUNTER — Other Ambulatory Visit: Payer: Self-pay | Admitting: Internal Medicine

## 2017-08-31 ENCOUNTER — Other Ambulatory Visit: Payer: Self-pay | Admitting: Cardiology

## 2017-09-01 DIAGNOSIS — L97511 Non-pressure chronic ulcer of other part of right foot limited to breakdown of skin: Secondary | ICD-10-CM | POA: Diagnosis not present

## 2017-09-01 DIAGNOSIS — E1142 Type 2 diabetes mellitus with diabetic polyneuropathy: Secondary | ICD-10-CM | POA: Diagnosis not present

## 2017-09-01 DIAGNOSIS — L97411 Non-pressure chronic ulcer of right heel and midfoot limited to breakdown of skin: Secondary | ICD-10-CM | POA: Diagnosis not present

## 2017-09-01 DIAGNOSIS — L84 Corns and callosities: Secondary | ICD-10-CM | POA: Diagnosis not present

## 2017-09-01 DIAGNOSIS — E11621 Type 2 diabetes mellitus with foot ulcer: Secondary | ICD-10-CM | POA: Diagnosis not present

## 2017-09-05 DIAGNOSIS — M7742 Metatarsalgia, left foot: Secondary | ICD-10-CM | POA: Diagnosis not present

## 2017-09-05 DIAGNOSIS — M7741 Metatarsalgia, right foot: Secondary | ICD-10-CM | POA: Diagnosis not present

## 2017-09-05 DIAGNOSIS — L853 Xerosis cutis: Secondary | ICD-10-CM | POA: Diagnosis not present

## 2017-09-05 DIAGNOSIS — M6701 Short Achilles tendon (acquired), right ankle: Secondary | ICD-10-CM | POA: Diagnosis not present

## 2017-09-05 DIAGNOSIS — E1142 Type 2 diabetes mellitus with diabetic polyneuropathy: Secondary | ICD-10-CM | POA: Diagnosis not present

## 2017-09-05 DIAGNOSIS — M6702 Short Achilles tendon (acquired), left ankle: Secondary | ICD-10-CM | POA: Diagnosis not present

## 2017-09-05 DIAGNOSIS — L84 Corns and callosities: Secondary | ICD-10-CM | POA: Diagnosis not present

## 2017-09-26 ENCOUNTER — Other Ambulatory Visit: Payer: Self-pay

## 2017-09-26 ENCOUNTER — Ambulatory Visit (HOSPITAL_BASED_OUTPATIENT_CLINIC_OR_DEPARTMENT_OTHER)
Admission: RE | Admit: 2017-09-26 | Discharge: 2017-09-26 | Disposition: A | Discharge status: Home / Self Care | Payer: Medicare Other | Source: Ambulatory Visit | Attending: Hematology & Oncology | Admitting: Hematology & Oncology

## 2017-09-26 ENCOUNTER — Inpatient Hospital Stay: Payer: Medicare Other | Attending: Hematology & Oncology

## 2017-09-26 ENCOUNTER — Inpatient Hospital Stay (HOSPITAL_BASED_OUTPATIENT_CLINIC_OR_DEPARTMENT_OTHER): Payer: Medicare Other | Admitting: Hematology & Oncology

## 2017-09-26 ENCOUNTER — Encounter: Payer: Self-pay | Admitting: Hematology & Oncology

## 2017-09-26 VITALS — BP 138/62 | HR 79 | Temp 97.7°F | Resp 20 | Wt 333.0 lb

## 2017-09-26 DIAGNOSIS — R161 Splenomegaly, not elsewhere classified: Secondary | ICD-10-CM | POA: Diagnosis present

## 2017-09-26 DIAGNOSIS — N289 Disorder of kidney and ureter, unspecified: Secondary | ICD-10-CM | POA: Diagnosis not present

## 2017-09-26 DIAGNOSIS — K760 Fatty (change of) liver, not elsewhere classified: Secondary | ICD-10-CM | POA: Diagnosis not present

## 2017-09-26 DIAGNOSIS — Z794 Long term (current) use of insulin: Secondary | ICD-10-CM | POA: Diagnosis not present

## 2017-09-26 DIAGNOSIS — Z7901 Long term (current) use of anticoagulants: Secondary | ICD-10-CM | POA: Insufficient documentation

## 2017-09-26 DIAGNOSIS — N281 Cyst of kidney, acquired: Secondary | ICD-10-CM | POA: Diagnosis not present

## 2017-09-26 DIAGNOSIS — I4819 Other persistent atrial fibrillation: Secondary | ICD-10-CM

## 2017-09-26 DIAGNOSIS — D696 Thrombocytopenia, unspecified: Secondary | ICD-10-CM

## 2017-09-26 DIAGNOSIS — Z79899 Other long term (current) drug therapy: Secondary | ICD-10-CM | POA: Insufficient documentation

## 2017-09-26 DIAGNOSIS — Z95810 Presence of automatic (implantable) cardiac defibrillator: Secondary | ICD-10-CM | POA: Insufficient documentation

## 2017-09-26 DIAGNOSIS — I481 Persistent atrial fibrillation: Secondary | ICD-10-CM | POA: Diagnosis present

## 2017-09-26 DIAGNOSIS — K8020 Calculus of gallbladder without cholecystitis without obstruction: Secondary | ICD-10-CM | POA: Insufficient documentation

## 2017-09-26 DIAGNOSIS — N2889 Other specified disorders of kidney and ureter: Secondary | ICD-10-CM

## 2017-09-26 LAB — CBC WITH DIFFERENTIAL (CANCER CENTER ONLY)
Abs Granulocyte: 3.6 10*3/uL (ref 1.5–6.5)
BASOS ABS: 0 10*3/uL (ref 0.0–0.1)
BASOS PCT: 0 %
EOS ABS: 0.2 10*3/uL (ref 0.0–0.5)
Eosinophils Relative: 3 %
HCT: 41.7 % (ref 38.7–49.9)
HEMOGLOBIN: 13.8 g/dL (ref 13.0–17.1)
LYMPHS ABS: 1 10*3/uL (ref 0.9–3.3)
Lymphocytes Relative: 18 %
MCH: 30.7 pg (ref 28.0–33.4)
MCHC: 33.1 g/dL (ref 32.0–35.9)
MCV: 92.7 fL (ref 82.0–98.0)
Monocytes Absolute: 0.7 10*3/uL (ref 0.1–0.9)
Monocytes Relative: 12 %
NEUTROS ABS: 3.6 10*3/uL (ref 1.5–6.5)
NEUTROS PCT: 67 %
Platelet Count: 68 10*3/uL — ABNORMAL LOW (ref 140–400)
RBC: 4.5 MIL/uL (ref 4.20–5.70)
RDW: 15.7 % (ref 11.1–15.7)
WBC Count: 5.4 10*3/uL (ref 4.0–10.3)

## 2017-09-26 LAB — CMP (CANCER CENTER ONLY)
ALK PHOS: 57 U/L (ref 40–150)
ALT: 50 U/L (ref 0–55)
ANION GAP: 10 (ref 3–11)
AST: 41 U/L — ABNORMAL HIGH (ref 5–34)
Albumin: 4.1 g/dL (ref 3.5–5.0)
BUN: 38 mg/dL — ABNORMAL HIGH (ref 7–26)
CALCIUM: 9.8 mg/dL (ref 8.4–10.4)
CO2: 25 mmol/L (ref 22–29)
Chloride: 102 mmol/L (ref 98–109)
Creatinine: 1.68 mg/dL — ABNORMAL HIGH (ref 0.70–1.30)
GFR, EST AFRICAN AMERICAN: 44 mL/min — AB (ref >60)
GFR, Estimated: 38 mL/min — ABNORMAL LOW (ref >60)
Glucose, Bld: 216 mg/dL — ABNORMAL HIGH (ref 70–140)
Potassium: 5.1 mmol/L (ref 3.5–5.1)
SODIUM: 137 mmol/L (ref 136–145)
TOTAL PROTEIN: 7.1 g/dL (ref 6.4–8.3)
Total Bilirubin: 0.6 mg/dL (ref 0.2–1.2)

## 2017-09-26 NOTE — Progress Notes (Signed)
Hematology and Oncology Follow Up Visit  Robert Gregory [id number] 08-08-1941 77 y.o. 09/26/2017   Principle Diagnosis:   Chronic thrombocytopenia secondary to splenomegaly  1.57 m exophytic lesion lower pole left kidney  Current Therapy:    Observation     Interim History:  Robert Gregory is back for follow-up. We got ultrasound of his abdomen today. This actually shows that his spleen is stable in size.  I think the volume of the spleen is 1077 cm3.  The exophytic lesion on the left kidney also is about the same size.  It is 1.5 cm.  Since we last saw him, he has had no problems.  He has had no bleeding or bruising.  He has had no cough or shortness of breath.  He has had no change in bowel or bladder habits.  He has had no hematuria.  He is planning on going back to the [facility] for vacation for a couple months in the summertime.    Overall, his performance status is ECOG 1.    Medications:  Current Outpatient Medications:  .  amoxicillin (AMOXIL) 500 MG capsule, Take 500 mg by mouth as directed. 6 prior to dental work, Disp: , Rfl:  .  BENICAR 40 MG tablet, TAKE 1 TABLET TWICE A DAY, Disp: 180 tablet, Rfl: 3 .  carvedilol (COREG) 12.5 MG tablet, TAKE 1 TABLET TWICE A DAY WITH MEALS, Disp: 180 tablet, Rfl: 3 .  cholecalciferol (VITAMIN D) 1000 units tablet, Take 1,000 Units by mouth daily., Disp: , Rfl:  .  Coenzyme Q10 200 MG TABS, Take 1 tablet by mouth daily., Disp: , Rfl:  .  Cranberry 200 MG CAPS, Take 200 mg by mouth daily. , Disp: , Rfl:  .  empagliflozin (JARDIANCE) 25 MG TABS tablet, Take 25 mg by mouth daily., Disp: 90 tablet, Rfl: 3 .  ergocalciferol (VITAMIN D2) 50000 units capsule, Take 50,000 Units by mouth once a week., Disp: , Rfl:  .  ezetimibe (ZETIA) 10 MG tablet, TAKE 1 TABLET DAILY, Disp: 90 tablet, Rfl: 3 .  furosemide (LASIX) 40 MG tablet, TAKE 1 TABLET DAILY, Disp: 90 tablet, Rfl: 3 .  insulin regular human CONCENTRATED (HUMULIN R) 500 UNIT/ML  injection, Inject up to 200 units a day as advised, Disp: 3 vial, Rfl: 3 .  Insulin Syringe/Needle U-500 (BD INSULIN SYRINGE U-500) 31G X 6MM 0.5 ML MISC, Inject 1 each into the skin 3 (three) times daily before meals. Use 3x a day, Disp: 300 each, Rfl: 3 .  KLOR-CON 10 10 MEQ tablet, TAKE 2 TABLETS (20 MEQ TOTAL) TWICE A DAY, Disp: 360 tablet, Rfl: 3 .  Levomefolate Calcium POWD, Take by mouth., Disp: , Rfl:  .  metFORMIN (GLUCOPHAGE-XR) 500 MG 24 hr tablet, TAKE 4 TABLETS (2000 MG) WITH DINNER, Disp: 360 tablet, Rfl: 0 .  montelukast (SINGULAIR) 10 MG tablet, Take 10 mg by mouth at bedtime., Disp: , Rfl:  .  Multiple Vitamins-Minerals (CENTRUM SILVER PO), Take 1 tablet by mouth daily., Disp: , Rfl:  .  nitroGLYCERIN (NITROSTAT) 0.4 MG SL tablet, DISSOLVE  1 TABLET UNDER THE TONGUE EVERY 5 MINUTES AS NEEDED FOR CHEST PAIN, Disp: 25 tablet, Rfl: 3 .  NON FORMULARY, Methyfolate 2000 MCG daily, Disp: , Rfl:  .  omega-3 acid ethyl esters (LOVAZA) 1 g capsule, TAKE 2 CAPSULES TWICE A DAY, Disp: 360 capsule, Rfl: 0 .  pravastatin (PRAVACHOL) 80 MG tablet, TAKE 1 TABLET DAILY, Disp: 90 tablet, Rfl: 3 .  Probiotic Product (PROBIOTIC DAILY PO), Take 1 tablet by mouth daily., Disp: , Rfl:  .  spironolactone (ALDACTONE) 25 MG tablet, TAKE 1 TABLET DAILY, Disp: 90 tablet, Rfl: 3 .  TRICOR 48 MG tablet, TAKE 1 TABLET DAILY, Disp: 90 tablet, Rfl: 2 .  XARELTO 20 MG TABS tablet, TAKE 1 TABLET DAILY WITH SUPPER, Disp: 90 tablet, Rfl: 3  Allergies:  Allergies  Allergen Reactions  . Adhesive [Tape]     Rash   . Iodine     flushing  . Seasonal Ic [Cholestatin]   . Contrast Media [Iodinated Diagnostic Agents] Rash and Other (See Comments)    flushing EXTREME FLUSHING     Past Medical History, Surgical history, Social history, and Family History were reviewed and updated.  Review of Systems: Review of Systems  Constitutional: Negative.   HENT: Negative.   Eyes: Negative.   Respiratory: Negative.     Cardiovascular: Negative.   Gastrointestinal: Negative.   Genitourinary: Negative.   Musculoskeletal: Negative.   Skin: Negative.   Neurological: Negative.   Endo/Heme/Allergies: Negative.   Psychiatric/Behavioral: Negative.     Physical Exam:  weight is 333 lb (151 kg) (abnormal). His oral temperature is 97.7 F (36.5 C). His blood pressure is 138/62 and his pulse is 79. His respiration is 20 and oxygen saturation is 98%.   Wt Readings from Last 3 Encounters:  09/26/17 (!) 333 lb (151 kg)  08/05/17 (!) 333 lb (151 kg)  08/01/17 270 lb (122.5 kg)     Physical Exam  Constitutional: He is oriented to person, place, and time.  HENT:  Head: Normocephalic and atraumatic.  Mouth/Throat: Oropharynx is clear and moist.  Eyes: EOM are normal. Pupils are equal, round, and reactive to light.  Neck: Normal range of motion.  Cardiovascular: Normal rate, regular rhythm and normal heart sounds.  Pulmonary/Chest: Effort normal and breath sounds normal.  Abdominal: Soft. Bowel sounds are normal.  Musculoskeletal: Normal range of motion. He exhibits no edema, tenderness or deformity.  Lymphadenopathy:    He has no cervical adenopathy.  Neurological: He is alert and oriented to person, place, and time.  Skin: Skin is warm and dry. No rash noted. No erythema.  Psychiatric: He has a normal mood and affect. His behavior is normal. Judgment and thought content normal.  Vitals reviewed.    Lab Results  Component Value Date   WBC 5.3 02/28/2017   HGB 14.6 02/28/2017   HCT 41.7 09/26/2017   MCV 92.7 09/26/2017   PLT 86 Platelet count consistent in citrate (L) 02/28/2017     Chemistry      Component Value Date/Time   NA 140 02/28/2017 1041   NA 141 04/21/2016 0852   K 4.0 02/28/2017 1041   K 4.4 04/21/2016 0852   CL 105 02/28/2017 1041   CO2 25 02/28/2017 1041   CO2 22 04/21/2016 0852   BUN 30 (H) 02/28/2017 1041   BUN 29.0 (H) 04/21/2016 0852   CREATININE 1.3 (H) 02/28/2017 1041    CREATININE 1.6 (H) 04/21/2016 0852      Component Value Date/Time   CALCIUM 9.7 02/28/2017 1041   CALCIUM 10.2 04/21/2016 0852   ALKPHOS 52 02/28/2017 1041   ALKPHOS 51 04/21/2016 0852   AST 48 (H) 02/28/2017 1041   AST 39 (H) 04/21/2016 0852   ALT 51 (H) 02/28/2017 1041   ALT 46 04/21/2016 0852   BILITOT 0.80 02/28/2017 1041   BILITOT 0.71 04/21/2016 9166  Impression and Plan: Mr. Cowgill is a 77 year old white male. He has moderate thrombocytopenia.   From my perspective, everything looks pretty stable.  His ultrasound is encouraging.  His spleen is not large.  The exophytic lesion on the left kidney is also not larger.  I think we can get him back in 6 months.  I would like to repeat the ultrasound so that I can see what his kidney lesion is doing.  I think that as long as this lesion is not growing, we do not have to put him through a CT scan.  Again, he cannot have an MRI because he has a defibrillator in place.  He is a happy having to come back in 6 months. [name], MD 1/7/201912:37 PM

## 2017-09-28 ENCOUNTER — Other Ambulatory Visit: Payer: Self-pay | Admitting: Cardiology

## 2017-09-28 DIAGNOSIS — I10 Essential (primary) hypertension: Secondary | ICD-10-CM

## 2017-09-28 DIAGNOSIS — E785 Hyperlipidemia, unspecified: Secondary | ICD-10-CM

## 2017-09-28 DIAGNOSIS — I255 Ischemic cardiomyopathy: Secondary | ICD-10-CM

## 2017-09-28 DIAGNOSIS — I2583 Coronary atherosclerosis due to lipid rich plaque: Secondary | ICD-10-CM

## 2017-09-28 DIAGNOSIS — Z95810 Presence of automatic (implantable) cardiac defibrillator: Secondary | ICD-10-CM

## 2017-09-28 DIAGNOSIS — I2510 Atherosclerotic heart disease of native coronary artery without angina pectoris: Secondary | ICD-10-CM

## 2017-09-28 DIAGNOSIS — I480 Paroxysmal atrial fibrillation: Secondary | ICD-10-CM

## 2017-10-06 ENCOUNTER — Other Ambulatory Visit: Payer: Self-pay | Admitting: *Deleted

## 2017-10-06 DIAGNOSIS — Z95810 Presence of automatic (implantable) cardiac defibrillator: Secondary | ICD-10-CM

## 2017-10-06 DIAGNOSIS — I480 Paroxysmal atrial fibrillation: Secondary | ICD-10-CM

## 2017-10-06 DIAGNOSIS — I2510 Atherosclerotic heart disease of native coronary artery without angina pectoris: Secondary | ICD-10-CM

## 2017-10-06 DIAGNOSIS — I255 Ischemic cardiomyopathy: Secondary | ICD-10-CM

## 2017-10-06 DIAGNOSIS — I2583 Coronary atherosclerosis due to lipid rich plaque: Secondary | ICD-10-CM

## 2017-10-06 DIAGNOSIS — E785 Hyperlipidemia, unspecified: Secondary | ICD-10-CM

## 2017-10-06 DIAGNOSIS — I10 Essential (primary) hypertension: Secondary | ICD-10-CM

## 2017-10-06 MED ORDER — OMEGA-3-ACID ETHYL ESTERS 1 G PO CAPS: ORAL_CAPSULE | ORAL | 5 refills | Status: DC

## 2017-10-12 ENCOUNTER — Telehealth: Payer: Self-pay | Admitting: Cardiology

## 2017-10-12 MED ORDER — OMEGA-3-ACID ETHYL ESTERS 1 G PO CAPS: ORAL_CAPSULE | ORAL | 1 refills | Status: DC

## 2017-10-12 NOTE — Telephone Encounter (Signed)
Spoke to pharmacist with [facility] refilled.Advised patient needs to schedule follow up appointment with Dr.Crenshaw.

## 2017-10-12 NOTE — Telephone Encounter (Signed)
Needs clarification of directions for pt's Omega 3. Reference number is [id number]

## 2017-10-16 ENCOUNTER — Other Ambulatory Visit: Payer: Self-pay | Admitting: Internal Medicine

## 2017-11-06 ENCOUNTER — Encounter: Payer: Self-pay | Admitting: Internal Medicine

## 2017-11-07 ENCOUNTER — Ambulatory Visit: Payer: Medicare Other | Admitting: Internal Medicine

## 2017-11-07 ENCOUNTER — Encounter: Payer: Self-pay | Admitting: Emergency Medicine

## 2017-11-07 ENCOUNTER — Emergency Department (INDEPENDENT_AMBULATORY_CARE_PROVIDER_SITE_OTHER)
Admission: EM | Admit: 2017-11-07 | Discharge: 2017-11-07 | Disposition: A | Discharge status: Home / Self Care | Payer: Medicare Other | Source: Home / Self Care | Attending: Family Medicine | Admitting: Family Medicine

## 2017-11-07 DIAGNOSIS — R5383 Other fatigue: Secondary | ICD-10-CM

## 2017-11-07 DIAGNOSIS — R531 Weakness: Secondary | ICD-10-CM | POA: Diagnosis not present

## 2017-11-07 DIAGNOSIS — Z0189 Encounter for other specified special examinations: Secondary | ICD-10-CM | POA: Diagnosis not present

## 2017-11-07 DIAGNOSIS — R739 Hyperglycemia, unspecified: Secondary | ICD-10-CM

## 2017-11-07 LAB — POCT CBC W AUTO DIFF (K'VILLE URGENT CARE)

## 2017-11-07 LAB — POCT INFLUENZA A/B
Influenza A, POC: NEGATIVE
Influenza B, POC: NEGATIVE

## 2017-11-07 LAB — POCT URINALYSIS DIP (MANUAL ENTRY)
Bilirubin, UA: NEGATIVE
Glucose, UA: 500 mg/dL — AB
Ketones, POC UA: NEGATIVE mg/dL
Leukocytes, UA: NEGATIVE
Nitrite, UA: NEGATIVE
Protein Ur, POC: NEGATIVE mg/dL
Spec Grav, UA: 1.01 (ref 1.010–1.025)
Urobilinogen, UA: 0.2 E.U./dL
pH, UA: 6 (ref 5.0–8.0)

## 2017-11-07 LAB — POCT FASTING CBG KUC MANUAL ENTRY: POCT Glucose (KUC): 326 mg/dL — AB (ref 70–99)

## 2017-11-07 NOTE — ED Triage Notes (Signed)
Pt c/o shaking, fatigue and fever of 100 x3 days. He is a diabetic and states his fasting glucose has been up to 367. States he has also had frequent urination.

## 2017-11-07 NOTE — ED Provider Notes (Signed)
[facility]    CSN: [id number] Arrival date & time: 11/07/17  1305     History   Chief Complaint Chief Complaint  Patient presents with  . Fatigue    HPI KEROLOS NEHME is a 77 y.o. male.   HPI TRAY KLAYMAN is a 77 y.o. male presenting to UC with c/o chills, fatigue, and "not feeling well" for the last 3 days. Associated low grade fever of 100*F for 3 days.  He is a diabetic and notes his fasting glucose has been up to 367 the last few days along with frequent urination.  He has never been hospitalized for his diabetes.  Denies cough, sore throat or headache. He has had minimal nasal congestion.  He did get the flu vaccine. No known sick contacts. No recent change in his medications. He is due to f/u with his endocrinologist for his diabetes in about 2 weeks.    Past Medical History:  Diagnosis Date  . Arthritis   . Atrial fibrillation ([place])    takes Xarelto daily  . CAD (coronary artery disease)   . Cardiomyopathy, ischemic 11/07/2013  . Cholelithiasis   . Chronic back pain    scoliosis/arthritis  . Complication of anesthesia    forgetful for about 2-3wks after anesthesia  . Congestive heart failure ([place])    takes Spironolactone daily  . Diabetes mellitus ([place])    takes Invokamet daily as well as Hum U  . History of colon polyps   . History of kidney stones   . Hyperlipidemia    takes [employer] daily  . Hyperlipidemia 11/07/2013  . Hypertension    takes Benicar and Coreg daily  . ICD (implantable cardioverter-defibrillator) battery depletion   . ICD (implantable cardioverter-defibrillator) in place 11/07/2013  . Ischemic cardiomyopathy   . Ischemic cardiomyopathy   . Joint pain   . Myocardial infarction ([place]) 1997   on the OR table   . Nephrolithiasis   . Obstructive sleep apnea   . Peripheral neuropathy   . Pneumonia    as a child  . Spinal stenosis   . Thrombocytopenia ([place])   . Total knee replacement status 11/29/2014  . Type  2 diabetes mellitus with stage 3 chronic kidney disease ([place]) 05/19/2015  . Urinary frequency   . Urinary urgency     Patient Active Problem List   Diagnosis Date Noted  . Obesity, Class III, BMI 40-49.9 (morbid obesity) ([place]) 05/06/2017  . Splenomegaly, not elsewhere classified 10/27/2016  . Renal mass, left 10/27/2016  . Abnormal nuclear stress test   . Obstructive sleep apnea   . Nephrolithiasis   . Thrombocytopenia ([place])   . Cholelithiasis   . ICD (implantable cardioverter-defibrillator) battery depletion   . Ischemic cardiomyopathy   . Hypertension   . Myocardial infarction ([place])   . Pneumonia   . Peripheral neuropathy   . Joint pain   . Chronic back pain   . History of colon polyps   . Urinary frequency   . History of kidney stones   . Type 2 diabetes mellitus with stage 3 chronic kidney disease ([place]) 05/19/2015  . H/O total knee replacement, right 11/29/2014  . Pre-operative cardiovascular examination 11/20/2014  . CAD (coronary artery disease) 11/07/2013  . Atrial fibrillation ([place]) 11/07/2013  . ICD (implantable cardioverter-defibrillator) in place 11/07/2013  . Essential hypertension 11/07/2013  . Hyperlipidemia 11/07/2013  . Cardiomyopathy, ischemic 11/07/2013    Past Surgical History:  Procedure Laterality Date  . CARDIAC CATHETERIZATION N/A  10/02/2015   Procedure: Left Heart Cath and Cors/Grafts Angiography;  Surgeon: Belva Crome, MD;  Location: [facility];  Service: Cardiovascular;  Laterality: N/A;  . COLONOSCOPY    . coronary artery bypass and graft  1997   x 5  . ESOPHAGOGASTRODUODENOSCOPY    . ICD placed    . LAMINECTOMY    . LITHOTRIPSY    . Right rotator cuff surgery    . TONSILLECTOMY     adenoids  . TOTAL KNEE ARTHROPLASTY Left   . TOTAL KNEE ARTHROPLASTY Right 11/29/2014   Procedure: RIGHT TOTAL KNEE ARTHROPLASTY;  Surgeon: Leandrew Koyanagi, MD;  Location: [facility];  Service: Orthopedics;  Laterality: Right;       Home Medications     Prior to Admission medications   Medication Sig Start Date End Date Taking? Authorizing Provider  amoxicillin (AMOXIL) 500 MG capsule Take 500 mg by mouth as directed. 6 prior to dental work    [provider]  BENICAR 40 MG tablet TAKE 1 TABLET TWICE A DAY 03/16/17   Lelon Perla, MD  carvedilol (COREG) 12.5 MG tablet TAKE 1 TABLET TWICE A DAY WITH MEALS 03/16/17   Lelon Perla, MD  cholecalciferol (VITAMIN D) 1000 units tablet Take 1,000 Units by mouth daily.    [provider]  Coenzyme Q10 200 MG TABS Take 1 tablet by mouth daily.    [provider]  Cranberry 200 MG CAPS Take 200 mg by mouth daily.     [provider]  ergocalciferol (VITAMIN D2) 50000 units capsule Take 50,000 Units by mouth once a week.    [provider]  ezetimibe (ZETIA) 10 MG tablet TAKE 1 TABLET DAILY 03/22/17   Lelon Perla, MD  furosemide (LASIX) 40 MG tablet TAKE 1 TABLET DAILY 03/16/17   Lelon Perla, MD  insulin regular human CONCENTRATED (HUMULIN R) 500 UNIT/ML injection Inject up to 200 units a day as advised 08/05/17   Philemon Kingdom, MD  Insulin Syringe/Needle U-500 (BD INSULIN SYRINGE U-500) 31G X 6MM 0.5 ML MISC Inject 1 each into the skin 3 (three) times daily before meals. Use 3x a day 10/01/16   Philemon Kingdom, MD  JARDIANCE 25 MG TABS tablet TAKE 1 TABLET DAILY (REPLACES INVOKANA) 10/17/17   Philemon Kingdom, MD  KLOR-CON 10 10 MEQ tablet TAKE 2 TABLETS (20 MEQ TOTAL) TWICE A DAY 02/07/17   Lelon Perla, MD  Levomefolate Calcium POWD Take by mouth.    [provider]  metFORMIN (GLUCOPHAGE-XR) 500 MG 24 hr tablet TAKE 4 TABLETS (2000 MG) WITH DINNER 08/31/17   Philemon Kingdom, MD  montelukast (SINGULAIR) 10 MG tablet Take 10 mg by mouth at bedtime.    [provider]  Multiple Vitamins-Minerals (CENTRUM SILVER PO) Take 1 tablet by mouth daily.    [provider]  nitroGLYCERIN (NITROSTAT) 0.4 MG SL  tablet DISSOLVE  1 TABLET UNDER THE TONGUE EVERY 5 MINUTES AS NEEDED FOR CHEST PAIN 08/31/17   Lelon Perla, MD  NON FORMULARY Methyfolate 2000 MCG daily    [provider]  omega-3 acid ethyl esters (LOVAZA) 1 g capsule Take 2 capsules twice a day 10/12/17   Lelon Perla, MD  pravastatin (PRAVACHOL) 80 MG tablet TAKE 1 TABLET DAILY 03/16/17   Lelon Perla, MD  Probiotic Product (PROBIOTIC DAILY PO) Take 1 tablet by mouth daily.    [provider]  spironolactone (ALDACTONE) 25 MG tablet TAKE 1  TABLET DAILY 03/16/17   Lelon Perla, MD  TRICOR 48 MG tablet TAKE 1 TABLET DAILY 05/16/17   Lelon Perla, MD  XARELTO 20 MG TABS tablet TAKE 1 TABLET DAILY WITH SUPPER 03/16/17   Lelon Perla, MD    Family History Family History  Problem Relation Age of Onset  . Heart disease Unknown        No family history    Social History Social History   Tobacco Use  . Smoking status: Never Smoker  . Smokeless tobacco: Never Used  Substance Use Topics  . Alcohol use: Yes    Alcohol/week: 0.0 oz    Comment: once drink a month  . Drug use: No     Allergies   Adhesive [tape]; Iodine; Seasonal ic [cholestatin]; and Contrast media [iodinated diagnostic agents]   Review of Systems Review of Systems  Constitutional: Positive for chills, fatigue and fever ( low-grade). Negative for appetite change.  HENT: Positive for congestion ( minimal). Negative for ear pain, sore throat, trouble swallowing and voice change.   Respiratory: Negative for cough and shortness of breath.   Cardiovascular: Negative for chest pain and palpitations.  Gastrointestinal: Negative for abdominal pain, diarrhea, nausea and vomiting.  Genitourinary: Positive for frequency. Negative for dysuria and hematuria.  Musculoskeletal: Negative for arthralgias, back pain and myalgias.  Skin: Negative for rash.  Neurological: Positive for weakness (generalized). Negative for dizziness,  light-headedness, numbness and headaches.     Physical Exam Triage Vital Signs ED Triage Vitals [11/07/17 1430]  Enc Vitals Group     BP 138/70     Pulse Rate 90     Resp      Temp 98 F (36.7 C)     Temp Source Oral     SpO2 96 %     Weight (!) 328 lb (148.8 kg)     Height      Head Circumference      Peak Flow      Pain Score 0     Pain Loc      Pain Edu?      Excl. in [place]?    No data found.  Updated Vital Signs BP 138/70 (BP Location: Right Arm)   Pulse 90   Temp 98 F (36.7 C) (Oral)   Wt (!) 328 lb (148.8 kg)   SpO2 96%   BMI 43.27 kg/m   Visual Acuity Right Eye Distance:   Left Eye Distance:   Bilateral Distance:    Right Eye Near:   Left Eye Near:    Bilateral Near:     Physical Exam  Constitutional: He is oriented to person, place, and time. He appears well-developed and well-nourished. No distress.  HENT:  Head: Normocephalic and atraumatic.  Right Ear: Tympanic membrane normal.  Left Ear: Tympanic membrane normal.  Nose: Nose normal. Right sinus exhibits no maxillary sinus tenderness and no frontal sinus tenderness. Left sinus exhibits no maxillary sinus tenderness and no frontal sinus tenderness.  Mouth/Throat: Uvula is midline, oropharynx is clear and moist and mucous membranes are normal.  Eyes: EOM are normal.  Neck: Normal range of motion. Neck supple.  Cardiovascular: Normal rate and regular rhythm.  Pulmonary/Chest: Effort normal and breath sounds normal. No stridor. No respiratory distress. He has no wheezes. He has no rales.  Musculoskeletal: Normal range of motion.  Neurological: He is alert and oriented to person, place, and time.  Skin: Skin is warm and dry. He is not diaphoretic.  Psychiatric: He has a normal mood and affect. His behavior is normal.  Nursing note and vitals reviewed.    UC Treatments / Results  Labs (all labs ordered are listed, but only abnormal results are displayed) Labs Reviewed  POCT FASTING CBG [facility]  MANUAL ENTRY - Abnormal; Notable for the following components:      Result Value   POCT Glucose ([facility]) 326 (*)    All other components within normal limits  POCT URINALYSIS DIP (MANUAL ENTRY) - Abnormal; Notable for the following components:   Clarity, UA cloudy (*)    Glucose, UA =500 (*)    Blood, UA trace-intact (*)    All other components within normal limits  BASIC METABOLIC PANEL  POCT CBC W AUTO DIFF ([facility])  POCT INFLUENZA A/B    EKG  EKG Interpretation None       Radiology No results found.  Procedures Procedures (including critical care time)  Medications Ordered in UC Medications - No data to display   Initial Impression / Assessment and Plan / UC Course  I have reviewed the triage vital signs and the nursing notes.  Pertinent labs & imaging results that were available during my care of the patient were reviewed by me and considered in my medical decision making (see chart for details).     Rapid flu: NEGATIVE UA: not c/w UTI No evidence of DKA at this time Pt may be developing a mild viral illness, which has increased his glucose.  Encouraged symptomatic treatment, fluids and rest F/u with PCP later this week for recheck of symptoms if not improving. Discussed symptoms that warrant emergent care in the ED.   Final Clinical Impressions(s) / UC Diagnoses   Final diagnoses:  Fatigue, unspecified type  Blood glucose elevated  Generalized weakness    ED Discharge Orders    None       Controlled Substance Prescriptions [place] Controlled Substance Registry consulted? Not Applicable   Tyrell Antonio 11/07/17 1540

## 2017-11-07 NOTE — Progress Notes (Deleted)
Patient ID: Robert Gregory, male   DOB: 11/21/1940, 77 y.o.   MRN: [id number]  HPI: Robert Gregory is a 77 y.o.-year-old male, returning for f/u for DM2, dx in 2004, insulin-dependent, uncontrolled, with complications (CAD - Ischemic CMP, s/p CABG x5 in 1997, CHF; CKD stage 3). Last visit 3 months ago.  Last hemoglobin A1c was: Lab Results  Component Value Date   HGBA1C 7.4 08/05/2017   HGBA1C 7.5 05/06/2017   HGBA1C 7.6 01/12/2017   Pt is on a regimen of: - Metformin ER 2000 mg with dinner - Jardiance 25 mg in am - U500 insulin: - 90-100units before breakfast - 30-35 units before lunch  - 30-35 units before dinner - U500 sliding scale:  150-175: + 2 units 176-200: + 3 units 201-225: + 4 units 226-300: + 5 units 301-325: + 6 units >325: + 7 units Stopped Cinnamon 500 mg 2x a day He gained 50 lbs with Actos in the past. Would not wan to restart. He was on Invokana 100 mg in am We stopped Bydureon at last visit 2/2 severe constipation >> this resolved after stopping the med.  Pt checks his sugars 3 3 times a day-reviewed log: - am:153-207, 232 >> 157-228 >> 149-214, 234 - 2h after b'fast:  191 >> n/c >> 194 >> 216 - before lunch:  170, 175, 253 >> 168-274 >> 147-261 - 2h after lunch: 145-185 >> 185-268 >> 191-314 - before dinner: 73, 83-180, 234 >> 84-204, 232 - 2h after dinner: 104-224, 243 >> 100-287 - bedtime:  126-289 >> 113-256 >> 88-272 - nighttime: 154-284 >> 113-172 >> 116-162 Lowest sugar: 88 >> ***; he has hypoglycemia awareness in the 70s. Highest sugar: 321 >> ***.  Glucometer: [employer] 2  Pt's meals are: - Breakfast: cereals + 2% milk, sausage + eggs + toast; still OJ - Lunch: soup + sandwich (lightest) - Dinner: meat + veggies + starch  - Snacks: 1- pm: peanuts, V8 juice  -+ CKD, last BUN/creatinine:  Lab Results  Component Value Date   BUN 38 (H) 09/26/2017   CREATININE 1.68 (H) 09/26/2017  On Benicar 80.  Reviewed GFR levels: Lab Results   Component Value Date   GFRNONAA 38 (L) 09/26/2017   GFRNONAA 61 (L) 12/01/2014   GFRNONAA 43 (L) 11/30/2014   GFRNONAA 49 (L) 11/19/2014   GFRNONAA 56 (L) 09/18/2014   GFRNONAA 37 (L) 09/11/2014   GFRNONAA 46 (L) 09/03/2014   GFRNONAA 56 (L) 07/31/2014   GFRNONAA 60 11/14/2013   -+ HL; last set of lipids: Lab Results  Component Value Date   CHOL 123 08/03/2017   HDL 26 (L) 08/03/2017   LDLCALC 26 08/03/2017   TRIG 355 (H) 08/03/2017   CHOLHDL 4.7 08/03/2017  On pravastatin 80. - last eye exam was in  12/2016: No DR -No numbness and tingling in his feet.  He has a history of a right diabetic foot ulcer developed after removal of a callus >> followed by Dr. Zigmund Daniel (Wound Care).  This is healing well.  ROS: Constitutional: no weight gain/no weight loss, no fatigue, no subjective hyperthermia, no subjective hypothermia Eyes: no blurry vision, no xerophthalmia ENT: no sore throat, no nodules palpated in throat, no dysphagia, no odynophagia, no hoarseness Cardiovascular: no CP/no SOB/no palpitations/no leg swelling Respiratory: no cough/no SOB/no wheezing Gastrointestinal: no N/no V/no D/no C/no acid reflux Musculoskeletal: no muscle aches/no joint aches Skin: no rashes, no hair loss Neurological: no tremors/no numbness/no tingling/no dizziness  I reviewed pt's  medications, allergies, PMH, social hx, family hx, and changes were documented in the history of present illness. Otherwise, unchanged from my initial visit note.  Past Medical History:  Diagnosis Date  . Arthritis   . Atrial fibrillation ([place])    takes Xarelto daily  . CAD (coronary artery disease)   . Cardiomyopathy, ischemic 11/07/2013  . Cholelithiasis   . Chronic back pain    scoliosis/arthritis  . Complication of anesthesia    forgetful for about 2-3wks after anesthesia  . Congestive heart failure ([place])    takes Spironolactone daily  . Diabetes mellitus ([place])    takes Invokamet daily as well as Hum U  .  History of colon polyps   . History of kidney stones   . Hyperlipidemia    takes [employer] daily  . Hyperlipidemia 11/07/2013  . Hypertension    takes Benicar and Coreg daily  . ICD (implantable cardioverter-defibrillator) battery depletion   . ICD (implantable cardioverter-defibrillator) in place 11/07/2013  . Ischemic cardiomyopathy   . Ischemic cardiomyopathy   . Joint pain   . Myocardial infarction ([place]) 1997   on the OR table   . Nephrolithiasis   . Obstructive sleep apnea   . Peripheral neuropathy   . Pneumonia    as a child  . Spinal stenosis   . Thrombocytopenia ([place])   . Total knee replacement status 11/29/2014  . Type 2 diabetes mellitus with stage 3 chronic kidney disease ([place]) 05/19/2015  . Urinary frequency   . Urinary urgency    Past Surgical History:  Procedure Laterality Date  . CARDIAC CATHETERIZATION N/A 10/02/2015   Procedure: Left Heart Cath and Cors/Grafts Angiography;  Surgeon: Belva Crome, MD;  Location: [facility];  Service: Cardiovascular;  Laterality: N/A;  . COLONOSCOPY    . coronary artery bypass and graft  1997   x 5  . ESOPHAGOGASTRODUODENOSCOPY    . ICD placed    . LAMINECTOMY    . LITHOTRIPSY    . Right rotator cuff surgery    . TONSILLECTOMY     adenoids  . TOTAL KNEE ARTHROPLASTY Left   . TOTAL KNEE ARTHROPLASTY Right 11/29/2014   Procedure: RIGHT TOTAL KNEE ARTHROPLASTY;  Surgeon: Leandrew Koyanagi, MD;  Location: [facility];  Service: Orthopedics;  Laterality: Right;   Social History   Social History  . Marital status: Married    Spouse name: N/A  . Number of children: N/A   Occupational History  .      Retired from [employer]  . Smoking status: Never Smoker  . Smokeless tobacco: Never Used  . Alcohol use 0.0 oz/week     Comment: once drink a month  . Drug use: No   Current Outpatient Medications on File Prior to Visit  Medication Sig Dispense Refill  . amoxicillin (AMOXIL)  500 MG capsule Take 500 mg by mouth as directed. 6 prior to dental work    . BENICAR 40 MG tablet TAKE 1 TABLET TWICE A DAY 180 tablet 3  . carvedilol (COREG) 12.5 MG tablet TAKE 1 TABLET TWICE A DAY WITH MEALS 180 tablet 3  . cholecalciferol (VITAMIN D) 1000 units tablet Take 1,000 Units by mouth daily.    . Coenzyme Q10 200 MG TABS Take 1 tablet by mouth daily.    . Cranberry 200 MG CAPS Take 200 mg by mouth daily.     . ergocalciferol (VITAMIN D2) 50000 units capsule Take 50,000  Units by mouth once a week.    . ezetimibe (ZETIA) 10 MG tablet TAKE 1 TABLET DAILY 90 tablet 3  . furosemide (LASIX) 40 MG tablet TAKE 1 TABLET DAILY 90 tablet 3  . insulin regular human CONCENTRATED (HUMULIN R) 500 UNIT/ML injection Inject up to 200 units a day as advised 3 vial 3  . Insulin Syringe/Needle U-500 (BD INSULIN SYRINGE U-500) 31G X 6MM 0.5 ML MISC Inject 1 each into the skin 3 (three) times daily before meals. Use 3x a day 300 each 3  . JARDIANCE 25 MG TABS tablet TAKE 1 TABLET DAILY (REPLACES INVOKANA) 90 tablet 3  . KLOR-CON 10 10 MEQ tablet TAKE 2 TABLETS (20 MEQ TOTAL) TWICE A DAY 360 tablet 3  . Levomefolate Calcium POWD Take by mouth.    . metFORMIN (GLUCOPHAGE-XR) 500 MG 24 hr tablet TAKE 4 TABLETS (2000 MG) WITH DINNER 360 tablet 0  . montelukast (SINGULAIR) 10 MG tablet Take 10 mg by mouth at bedtime.    . Multiple Vitamins-Minerals (CENTRUM SILVER PO) Take 1 tablet by mouth daily.    . nitroGLYCERIN (NITROSTAT) 0.4 MG SL tablet DISSOLVE  1 TABLET UNDER THE TONGUE EVERY 5 MINUTES AS NEEDED FOR CHEST PAIN 25 tablet 3  . NON FORMULARY Methyfolate 2000 MCG daily    . omega-3 acid ethyl esters (LOVAZA) 1 g capsule Take 2 capsules twice a day 360 capsule 1  . pravastatin (PRAVACHOL) 80 MG tablet TAKE 1 TABLET DAILY 90 tablet 3  . Probiotic Product (PROBIOTIC DAILY PO) Take 1 tablet by mouth daily.    Marland Kitchen spironolactone (ALDACTONE) 25 MG tablet TAKE 1 TABLET DAILY 90 tablet 3  . TRICOR 48 MG tablet  TAKE 1 TABLET DAILY 90 tablet 2  . XARELTO 20 MG TABS tablet TAKE 1 TABLET DAILY WITH SUPPER 90 tablet 3   No current facility-administered medications on file prior to visit.    Allergies  Allergen Reactions  . Adhesive [Tape]     Rash   . Iodine     flushing  . Seasonal Ic [Cholestatin]   . Contrast Media [Iodinated Diagnostic Agents] Rash and Other (See Comments)    flushing EXTREME FLUSHING    Family History  Problem Relation Age of Onset  . Heart disease Unknown        No family history   PE: There were no vitals taken for this visit. There is no height or weight on file to calculate BMI. Wt Readings from Last 3 Encounters:  09/26/17 (!) 333 lb (151 kg)  08/05/17 (!) 333 lb (151 kg)  08/01/17 270 lb (122.5 kg)   Constitutional: overweight, in NAD Eyes: PERRLA, EOMI, no exophthalmos ENT: moist mucous membranes, no thyromegaly, no cervical lymphadenopathy Cardiovascular: RRR, No MRG, + LE swelling Respiratory: CTA B Gastrointestinal: abdomen soft, NT, ND, BS+ Musculoskeletal: no deformities, strength intact in all 4 Skin: moist, warm, no rashes Neurological: no tremor with outstretched hands, DTR normal in all 4  ASSESSMENT: 1. DM2, insulin-dependent, uncontrolled, with complications - CAD, s/p CABG 5x in 1997 (Dr Roxy Horseman), iCMP >> CHF (Dr Stanford Breed) - CKD stage 3   2. Obesity class 3  PLAN:  1. Patient with long-standing, uncontrolled, type 2 diabetes, very insulin resistant, on U500 insulin regimen, metformin, and SGL 2 inhibitor.  At last visit, sugars were slightly better, but still very fluctuating.  We did not change his regimen at that time but discussed at length about improving his diet.  He and his wife  eat out a lot, almost every lunch, so h he has higher sugars after lunch but they improved some before dinner  - I suggested to:  Patient Instructions  Please continue: - Metformin ER 2000 mg with dinner - Jardiance 25 mg in am - U500 insulin: -  90-100units before breakfast - 30-35 units before lunch  - 30-35 units before dinner - U500 sliding scale:  150-175: + 2 units 176-200: + 3 units 201-225: + 4 units 226-300: + 5 units 301-325: + 6 units >325: + 7 units  Please return in 3-4 months with your sugar log.   - today, HbA1c is 7%  - continue checking sugars at different times of the day - check 3x a day, rotating checks - advised for yearly eye exams >> he is UTD - Return to clinic in 3-4 mo with sugar log   2. Obesity class 3 -Continues to gain weight -We did discuss about the benefits of gastric bypass surgery in the past but he refused -We could not continue GLP-1 receptor agonist, which could have helped controlling his weight-because he developed with constipation -We will continue Jardiance which should help with weight loss -Discussed about the importance of changing his diet  Philemon Kingdom, MD PhD [facility] Endocrinology

## 2017-11-08 ENCOUNTER — Telehealth: Payer: Self-pay | Admitting: *Deleted

## 2017-11-08 LAB — BASIC METABOLIC PANEL
BUN/Creatinine Ratio: 23 (calc) — ABNORMAL HIGH (ref 6–22)
BUN: 34 mg/dL — ABNORMAL HIGH (ref 7–25)
CO2: 29 mmol/L (ref 20–32)
Calcium: 9.7 mg/dL (ref 8.6–10.3)
Chloride: 102 mmol/L (ref 98–110)
Creat: 1.46 mg/dL — ABNORMAL HIGH (ref 0.70–1.18)
Glucose, Bld: 340 mg/dL — ABNORMAL HIGH (ref 65–99)
Potassium: 4.8 mmol/L (ref 3.5–5.3)
Sodium: 140 mmol/L (ref 135–146)

## 2017-11-08 NOTE — Telephone Encounter (Signed)
Callback: Patient notified of lab results. He is feeling some improvement today. He has plans to f/u with PCP.

## 2017-11-10 ENCOUNTER — Ambulatory Visit (INDEPENDENT_AMBULATORY_CARE_PROVIDER_SITE_OTHER): Payer: Medicare Other | Admitting: Internal Medicine

## 2017-11-10 ENCOUNTER — Encounter: Payer: Self-pay | Admitting: Internal Medicine

## 2017-11-10 VITALS — BP 116/62 | HR 89 | Temp 98.5°F | Wt 324.4 lb

## 2017-11-10 DIAGNOSIS — I255 Ischemic cardiomyopathy: Secondary | ICD-10-CM

## 2017-11-10 DIAGNOSIS — E1122 Type 2 diabetes mellitus with diabetic chronic kidney disease: Secondary | ICD-10-CM | POA: Diagnosis not present

## 2017-11-10 DIAGNOSIS — N183 Chronic kidney disease, stage 3 unspecified: Secondary | ICD-10-CM

## 2017-11-10 DIAGNOSIS — Z794 Long term (current) use of insulin: Secondary | ICD-10-CM

## 2017-11-10 LAB — POCT GLYCOSYLATED HEMOGLOBIN (HGB A1C): Hemoglobin A1C: 8.1

## 2017-11-10 NOTE — Progress Notes (Signed)
Patient ID: Robert Gregory, male   DOB: [birth year], 77 y.o.   MRN: [id number]  HPI: Robert Gregory is a 77 y.o.-year-old male, returning for f/u for DM2, dx in 2004, insulin-dependent, uncontrolled, with complications (CAD - Ischemic CMP, s/p CABG x5 in 1997, CHF; CKD stage 3). Last visit 3 months ago.   He had URI 3 weeks ago >> resolved >> then started to feel weak, sleeping a lot, low grade fever, sugars higher (sugars >300s). No clear dx. Now feeling a little better, but sugars still high, despite increasing his insulin.  Last hemoglobin A1c was: Lab Results  Component Value Date   HGBA1C 7.4 08/05/2017   HGBA1C 7.5 05/06/2017   HGBA1C 7.6 01/12/2017   Pt is on a regimen of: - Metformin ER 2000 mg with dinner - Jardiance 25 mg in am - U500 insulin: - 90-100 units before breakfast - 30-35 >> 50 units before lunch (increased at the end of January) - 30-35 >> 50 units before dinner - U500 sliding scale:  150-175: + 2 units 176-200: + 3 units 201-225: + 4 units 226-300: + 5 units 301-325: + 6 units >325: + 7 units He was on Cinnamon 500 mg 2x a day He gained 50 lbs with Actos in the past.  He was on Invokana 100 mg in am in the past. We stopped Bydureon at last visit 2/2 severe constipation >> this resolved after stopping the med.  Pt checks his sugars 3-5 times a day-reviewed log: - am:153-207, 232 >> 157-228 >> 149-214, 234 >> 175-241 - 2h after b'fast:  191 >> n/c >> 194 >> 216 >> 278 - before lunch:  170, 175, 253 >> 168-274 >> 147-261 >> 207-362 - 2h after lunch: 145-185 >> 185-268 >> 191-314 >> 207-315 - before dinner: 73, 83-180, 234 >> 84-204, 232 >> 173-295 - 2h after dinner: 104-224, 243 >> 100-287 >> 196-278 - bedtime:  126-289 >> 113-256 >> 88-272 >> 201, 233 - nighttime: 154-284 >> 113-172 >> 116-162 >> 132 Lowest sugar: 88 >> 132; he has hypoglycemia awareness in the 70s. Highest sugar: 321 >> 362.  Glucometer: [employer] 2  Pt's meals are: - Breakfast:  cereals + 2% milk, sausage + eggs + toast; still OJ - Lunch: soup + sandwich (lightest) - Dinner: meat + veggies + starch  - Snacks: 1- pm: peanuts, V8 juice  -+ CKD, last BUN/creatinine:  Lab Results  Component Value Date   BUN 34 (H) 11/07/2017   CREATININE 1.46 (H) 11/07/2017  On Benicar 80.  Reviewed GFR levels: Lab Results  Component Value Date   GFRNONAA 38 (L) 09/26/2017   GFRNONAA 61 (L) 12/01/2014   GFRNONAA 43 (L) 11/30/2014   GFRNONAA 49 (L) 11/19/2014   GFRNONAA 56 (L) 09/18/2014   GFRNONAA 37 (L) 09/11/2014   GFRNONAA 46 (L) 09/03/2014   GFRNONAA 56 (L) 07/31/2014   GFRNONAA 60 11/14/2013   -+ HL; last set of lipids: Lab Results  Component Value Date   CHOL 123 08/03/2017   HDL 26 (L) 08/03/2017   LDLCALC 26 08/03/2017   TRIG 355 (H) 08/03/2017   CHOLHDL 4.7 08/03/2017  On pravastatin 80. - last eye exam was in  0 12/2016: No DR -No numbness and tingling in his feet.  He has a history of a right diabetic foot ulcer developed after removal of a callus >> followed by Dr. Zigmund Daniel (Wound Care).  This is healing well.  ROS: Constitutional: no weight gain/no weight loss, +  fatigue, no subjective hyperthermia, no subjective hypothermia Eyes: no blurry vision, no xerophthalmia ENT: no sore throat, no nodules palpated in throat, no dysphagia, no odynophagia, no hoarseness Cardiovascular: no CP/no SOB/no palpitations/+ leg swelling Respiratory: no cough/no SOB/no wheezing Gastrointestinal: no N/no V/no D/no C/no acid reflux Musculoskeletal: no muscle aches/no joint aches Skin: no rashes, no hair loss Neurological: no tremors/no numbness/no tingling/+ dizziness  I reviewed pt's medications, allergies, PMH, social hx, family hx, and changes were documented in the history of present illness. Otherwise, unchanged from my initial visit note.  Past Medical History:  Diagnosis Date  . Arthritis   . Atrial fibrillation ([place])    takes Xarelto daily  . CAD (coronary  artery disease)   . Cardiomyopathy, ischemic 11/07/2013  . Cholelithiasis   . Chronic back pain    scoliosis/arthritis  . Complication of anesthesia    forgetful for about 2-3wks after anesthesia  . Congestive heart failure ([place])    takes Spironolactone daily  . Diabetes mellitus ([place])    takes Invokamet daily as well as Hum U  . History of colon polyps   . History of kidney stones   . Hyperlipidemia    takes [employer] daily  . Hyperlipidemia 11/07/2013  . Hypertension    takes Benicar and Coreg daily  . ICD (implantable cardioverter-defibrillator) battery depletion   . ICD (implantable cardioverter-defibrillator) in place 11/07/2013  . Ischemic cardiomyopathy   . Ischemic cardiomyopathy   . Joint pain   . Myocardial infarction ([place]) 1997   on the OR table   . Nephrolithiasis   . Obstructive sleep apnea   . Peripheral neuropathy   . Pneumonia    as a child  . Spinal stenosis   . Thrombocytopenia ([place])   . Total knee replacement status 11/29/2014  . Type 2 diabetes mellitus with stage 3 chronic kidney disease ([place]) 05/19/2015  . Urinary frequency   . Urinary urgency    Past Surgical History:  Procedure Laterality Date  . CARDIAC CATHETERIZATION N/A 10/02/2015   Procedure: Left Heart Cath and Cors/Grafts Angiography;  Surgeon: Belva Crome, MD;  Location: [facility];  Service: Cardiovascular;  Laterality: N/A;  . COLONOSCOPY    . coronary artery bypass and graft  1997   x 5  . ESOPHAGOGASTRODUODENOSCOPY    . ICD placed    . LAMINECTOMY    . LITHOTRIPSY    . Right rotator cuff surgery    . TONSILLECTOMY     adenoids  . TOTAL KNEE ARTHROPLASTY Left   . TOTAL KNEE ARTHROPLASTY Right 11/29/2014   Procedure: RIGHT TOTAL KNEE ARTHROPLASTY;  Surgeon: Leandrew Koyanagi, MD;  Location: [facility];  Service: Orthopedics;  Laterality: Right;   Social History   Social History  . Marital status: Married    Spouse name: N/A  . Number of children: N/A    Occupational History  .      Retired from [employer]  . Smoking status: Never Smoker  . Smokeless tobacco: Never Used  . Alcohol use 0.0 oz/week     Comment: once drink a month  . Drug use: No   Current Outpatient Medications on File Prior to Visit  Medication Sig Dispense Refill  . amoxicillin (AMOXIL) 500 MG capsule Take 500 mg by mouth as directed. 6 prior to dental work    . BENICAR 40 MG tablet TAKE 1 TABLET TWICE A DAY 180 tablet 3  . carvedilol (COREG)  12.5 MG tablet TAKE 1 TABLET TWICE A DAY WITH MEALS 180 tablet 3  . cholecalciferol (VITAMIN D) 1000 units tablet Take 1,000 Units by mouth daily.    . Coenzyme Q10 200 MG TABS Take 1 tablet by mouth daily.    . Cranberry 200 MG CAPS Take 200 mg by mouth daily.     . ergocalciferol (VITAMIN D2) 50000 units capsule Take 50,000 Units by mouth once a week.    . ezetimibe (ZETIA) 10 MG tablet TAKE 1 TABLET DAILY 90 tablet 3  . furosemide (LASIX) 40 MG tablet TAKE 1 TABLET DAILY 90 tablet 3  . insulin regular human CONCENTRATED (HUMULIN R) 500 UNIT/ML injection Inject up to 200 units a day as advised 3 vial 3  . Insulin Syringe/Needle U-500 (BD INSULIN SYRINGE U-500) 31G X 6MM 0.5 ML MISC Inject 1 each into the skin 3 (three) times daily before meals. Use 3x a day 300 each 3  . JARDIANCE 25 MG TABS tablet TAKE 1 TABLET DAILY (REPLACES INVOKANA) 90 tablet 3  . KLOR-CON 10 10 MEQ tablet TAKE 2 TABLETS (20 MEQ TOTAL) TWICE A DAY 360 tablet 3  . Levomefolate Calcium POWD Take by mouth.    . metFORMIN (GLUCOPHAGE-XR) 500 MG 24 hr tablet TAKE 4 TABLETS (2000 MG) WITH DINNER 360 tablet 0  . montelukast (SINGULAIR) 10 MG tablet Take 10 mg by mouth at bedtime.    . Multiple Vitamins-Minerals (CENTRUM SILVER PO) Take 1 tablet by mouth daily.    . nitroGLYCERIN (NITROSTAT) 0.4 MG SL tablet DISSOLVE  1 TABLET UNDER THE TONGUE EVERY 5 MINUTES AS NEEDED FOR CHEST PAIN 25 tablet 3  . NON FORMULARY Methyfolate 2000  MCG daily    . omega-3 acid ethyl esters (LOVAZA) 1 g capsule Take 2 capsules twice a day 360 capsule 1  . pravastatin (PRAVACHOL) 80 MG tablet TAKE 1 TABLET DAILY 90 tablet 3  . Probiotic Product (PROBIOTIC DAILY PO) Take 1 tablet by mouth daily.    Marland Kitchen spironolactone (ALDACTONE) 25 MG tablet TAKE 1 TABLET DAILY 90 tablet 3  . TRICOR 48 MG tablet TAKE 1 TABLET DAILY 90 tablet 2  . XARELTO 20 MG TABS tablet TAKE 1 TABLET DAILY WITH SUPPER 90 tablet 3   No current facility-administered medications on file prior to visit.    Allergies  Allergen Reactions  . Adhesive [Tape]     Rash   . Iodine     flushing  . Seasonal Ic [Cholestatin]   . Contrast Media [Iodinated Diagnostic Agents] Rash and Other (See Comments)    flushing EXTREME FLUSHING    Family History  Problem Relation Age of Onset  . Heart disease Unknown        No family history   PE: There were no vitals taken for this visit. There is no height or weight on file to calculate BMI. Wt Readings from Last 3 Encounters:  11/07/17 (!) 328 lb (148.8 kg)  09/26/17 (!) 333 lb (151 kg)  08/05/17 (!) 333 lb (151 kg)   Constitutional: obese, in NAD Eyes: PERRLA, EOMI, no exophthalmos ENT: moist mucous membranes, no thyromegaly, no cervical lymphadenopathy Cardiovascular: RRR, No MRG, + LE swelling Respiratory: CTA B Gastrointestinal: abdomen soft, NT, ND, BS+ Musculoskeletal: no deformities, strength intact in all 4 Skin: moist, warm, no rashes Neurological: no tremor with outstretched hands, DTR normal in all 4  ASSESSMENT: 1. DM2, insulin-dependent, uncontrolled, with complications - CAD, s/p CABG 5x in 1997 (Dr Roxy Horseman), iCMP >>  CHF (Dr Stanford Breed) - CKD stage 3   2. Obesity class 3  PLAN:  1. Patient with long-standing, uncontrolled, type 2 diabetes, very insulin resistant, on U500 insulin regimen, metformin, SGL T-2 inhibitor.  At last visit, sugars were slightly better, but still fluctuating.  We did not change his  regimen at that time but discussed at length about improving his diet.  He and his wife eat out a lot, almost every lunch, so he has higher sugars after lunch but they improve his on before dinner.   - This visit was scheduled urgently after he called that his sugars remain high despite increasing U500 doses. He presented  to the emergency room with flu-like sxs (flu swab neg.), nl WBC, no UTI.  - at this visit, will stop Jardiance 2/2 dehydration  - also GFR lower  - he now feels a little better, but sugars still high >> reviewed his log together >> sugars high in am and before dinner and lower at bedtime or at night >> we will increase insulin with B and L, but we cannot increase the insulin with dinner safely.. - will continue Metformin, but may need to reduce visit at next visit - I suggested to:  Patient Instructions  Please continue: - Metformin ER 2000 mg with dinner  Stop: - Jardiance  Increase:  - U500 insulin: - 100 units before breakfast - 65 units before lunch  - 50 units before dinner  Please return in 1.5 months with your sugar log.   - today, HbA1c is 8.1% (higher) - continue checking sugars at different times of the day - check 3x a day, rotating checks - advised for yearly eye exams >> he is UTD - Return to clinic in 3 mo with sugar log   2. Obesity class 3 -Lost few pounds during his recent illness - we did discuss about the benefits of gastric bypass surgery in the past, but he refused -We could not continue the GLP-1 receptor agonist which could have helped with his weight, because of significant constipation -unfortunately we need to stop Jardiance and increase U500. I hope these are temporary measures, only. Will readdress in 1.5 mo. -Discussed about the importance of changing his diet diet  Philemon Kingdom, MD PhD [facility] Endocrinology

## 2017-11-10 NOTE — Patient Instructions (Signed)
Please continue: - Metformin ER 2000 mg with dinner  Stop: - Jardiance  Increase:  - U500 insulin: - 100 units before breakfast - 65 units before lunch  - 50 units before dinner  Please return in 1.5 months with your sugar log.

## 2017-11-11 ENCOUNTER — Other Ambulatory Visit: Payer: Self-pay | Admitting: Internal Medicine

## 2017-11-23 ENCOUNTER — Ambulatory Visit (INDEPENDENT_AMBULATORY_CARE_PROVIDER_SITE_OTHER): Payer: Medicare Other | Admitting: *Deleted

## 2017-11-23 DIAGNOSIS — I255 Ischemic cardiomyopathy: Secondary | ICD-10-CM | POA: Diagnosis not present

## 2017-11-23 NOTE — Progress Notes (Signed)
Remote ICD transmission.   

## 2017-11-24 ENCOUNTER — Encounter: Payer: Self-pay | Admitting: Cardiology

## 2017-11-24 DIAGNOSIS — J111 Influenza due to unidentified influenza virus with other respiratory manifestations: Secondary | ICD-10-CM | POA: Diagnosis not present

## 2017-11-24 NOTE — Progress Notes (Signed)
Letter  

## 2017-11-28 ENCOUNTER — Other Ambulatory Visit: Payer: Self-pay | Admitting: Internal Medicine

## 2017-11-29 LAB — CUP PACEART REMOTE DEVICE CHECK
Date Time Interrogation Session: 20190312210317
Implantable Lead Implant Date: 20140320
Implantable Lead Location: 753860
MDC IDC LEAD IMPLANT DT: 20140320
MDC IDC LEAD LOCATION: 753859
MDC IDC PG IMPLANT DT: 20140320
Pulse Gen Serial Number: 7067730

## 2017-12-06 ENCOUNTER — Ambulatory Visit (INDEPENDENT_AMBULATORY_CARE_PROVIDER_SITE_OTHER): Payer: Medicare Other | Admitting: Internal Medicine

## 2017-12-06 ENCOUNTER — Encounter: Payer: Self-pay | Admitting: Internal Medicine

## 2017-12-06 VITALS — BP 140/70 | HR 82 | Ht 73.0 in | Wt 329.0 lb

## 2017-12-06 DIAGNOSIS — Z95810 Presence of automatic (implantable) cardiac defibrillator: Secondary | ICD-10-CM | POA: Diagnosis not present

## 2017-12-06 DIAGNOSIS — I255 Ischemic cardiomyopathy: Secondary | ICD-10-CM

## 2017-12-06 DIAGNOSIS — Z951 Presence of aortocoronary bypass graft: Secondary | ICD-10-CM | POA: Diagnosis not present

## 2017-12-06 DIAGNOSIS — I480 Paroxysmal atrial fibrillation: Secondary | ICD-10-CM

## 2017-12-06 DIAGNOSIS — I5022 Chronic systolic (congestive) heart failure: Secondary | ICD-10-CM

## 2017-12-06 LAB — CUP PACEART INCLINIC DEVICE CHECK
Brady Statistic RV Percent Paced: 0.03 %
HighPow Impedance: 78.75 Ohm
Implantable Lead Implant Date: 20140320
Implantable Lead Location: 753860
Implantable Pulse Generator Implant Date: 20140320
Lead Channel Impedance Value: 400 Ohm
Lead Channel Pacing Threshold Amplitude: 0.75 V
Lead Channel Pacing Threshold Pulse Width: 0.5 ms
Lead Channel Pacing Threshold Pulse Width: 0.5 ms
Lead Channel Sensing Intrinsic Amplitude: 11.9 mV
Lead Channel Sensing Intrinsic Amplitude: 2.5 mV
Lead Channel Setting Pacing Pulse Width: 0.5 ms
Lead Channel Setting Sensing Sensitivity: 0.5 mV
MDC IDC LEAD IMPLANT DT: 20140320
MDC IDC LEAD LOCATION: 753859
MDC IDC MSMT BATTERY REMAINING LONGEVITY: 58 mo
MDC IDC MSMT LEADCHNL RA IMPEDANCE VALUE: 375 Ohm
MDC IDC MSMT LEADCHNL RV PACING THRESHOLD AMPLITUDE: 0.75 V
MDC IDC SESS DTM: 20190319143536
MDC IDC SET LEADCHNL RV PACING AMPLITUDE: 2 V
MDC IDC STAT BRADY RA PERCENT PACED: 0 %
Pulse Gen Serial Number: 7067730

## 2017-12-06 MED ORDER — FUROSEMIDE 40 MG PO TABS: 20.0000 mg | ORAL_TABLET | Freq: Every day | ORAL | 3 refills | Status: DC

## 2017-12-06 NOTE — Patient Instructions (Addendum)
Medication Instructions:  Your physician has recommended you make the following change in your medication:  1.  Reduce your furosemide 40 mg to 1/2 tablet by mouth daily.  Labwork: None ordered.  Testing/Procedures: None ordered.  Follow-Up: Your physician wants you to follow-up in: one year with [name].   You will receive a reminder letter in the mail two months in advance. If you don't receive a letter, please call our office to schedule the follow-up appointment.  Remote monitoring is used to monitor your ICD from home. This monitoring reduces the number of office visits required to check your device to one time per year. It allows [place] to keep an eye on the functioning of your device to ensure it is working properly. You are scheduled for a device check from home on 02/22/2018. You may send your transmission at any time that day. If you have a wireless device, the transmission will be sent automatically. After your physician reviews your transmission, you will receive a postcard with your next transmission date.  Any Other Special Instructions Will Be Listed Below (If Applicable).  If you need a refill on your cardiac medications before your next appointment, please call your pharmacy.

## 2017-12-06 NOTE — Progress Notes (Signed)
HPI Robert Gregory returns today for ongoing management of his ICD. He is a pleasant 77 yo man with morbid obesity, who has a h/o CAD and underwent CABG. He has a h/o atrial fibrillation. He has chronic systolic heart failure with an EF of 35%. He has DM.  His heart failure symptoms are class II, although he is quite sedentary. No ICD shocks. His biggest complaint today is that he feels episodic lightheadedness and this is associated with low (for him) blood pressures, typically in the low 100's. He has not had syncope. No edema. Allergies  Allergen Reactions  . Adhesive [Tape]     Rash   . Iodine     flushing  . Seasonal Ic [Cholestatin]   . Contrast Media [Iodinated Diagnostic Agents] Rash and Other (See Comments)    flushing EXTREME FLUSHING      Current Outpatient Medications  Medication Sig Dispense Refill  . amoxicillin (AMOXIL) 500 MG capsule Take 500 mg by mouth as directed. 6 prior to dental work    . BD INSULIN SYRINGE U-500 31G X 6MM 0.5 ML MISC USE 1 SYRINGE TO INJECT UNDER THE SKIN THREE TIMES A DAY BEFORE MEALS 300 each 3  . BENICAR 40 MG tablet TAKE 1 TABLET TWICE A DAY 180 tablet 3  . carvedilol (COREG) 12.5 MG tablet TAKE 1 TABLET TWICE A DAY WITH MEALS 180 tablet 3  . cholecalciferol (VITAMIN D) 1000 units tablet Take 1,000 Units by mouth daily.    . Coenzyme Q10 200 MG TABS Take 1 tablet by mouth daily.    . Cranberry 200 MG CAPS Take 200 mg by mouth daily.     . ergocalciferol (VITAMIN D2) 50000 units capsule Take 50,000 Units by mouth once a week.    . ezetimibe (ZETIA) 10 MG tablet TAKE 1 TABLET DAILY 90 tablet 3  . insulin regular human CONCENTRATED (HUMULIN R) 500 UNIT/ML injection Inject up to 200 units a day as advised 3 vial 3  . KLOR-CON 10 10 MEQ tablet TAKE 2 TABLETS (20 MEQ TOTAL) TWICE A DAY 360 tablet 3  . metFORMIN (GLUCOPHAGE-XR) 500 MG 24 hr tablet TAKE 4 TABLETS (2000 MG) WITH DINNER 360 tablet 0  . Multiple Vitamins-Minerals (CENTRUM SILVER PO)  Take 1 tablet by mouth daily.    . nitroGLYCERIN (NITROSTAT) 0.4 MG SL tablet DISSOLVE  1 TABLET UNDER THE TONGUE EVERY 5 MINUTES AS NEEDED FOR CHEST PAIN 25 tablet 3  . NON FORMULARY Methyfolate 2000 MCG daily    . omega-3 acid ethyl esters (LOVAZA) 1 g capsule Take 2 capsules twice a day 360 capsule 1  . pravastatin (PRAVACHOL) 80 MG tablet TAKE 1 TABLET DAILY 90 tablet 3  . PROAIR HFA 108 (90 Base) MCG/ACT inhaler Inhale 2 puffs into the lungs as needed.    . Probiotic Product (PROBIOTIC DAILY PO) Take 1 tablet by mouth daily.    Marland Kitchen spironolactone (ALDACTONE) 25 MG tablet TAKE 1 TABLET DAILY 90 tablet 3  . TRICOR 48 MG tablet TAKE 1 TABLET DAILY 90 tablet 2  . XARELTO 20 MG TABS tablet TAKE 1 TABLET DAILY WITH SUPPER 90 tablet 3  . furosemide (LASIX) 40 MG tablet Take 0.5 tablets (20 mg total) by mouth daily. 45 tablet 3   No current facility-administered medications for this visit.      Past Medical History:  Diagnosis Date  . Arthritis   . Atrial fibrillation ([place])    takes Xarelto daily  .  CAD (coronary artery disease)   . Cardiomyopathy, ischemic 11/07/2013  . Cholelithiasis   . Chronic back pain    scoliosis/arthritis  . Complication of anesthesia    forgetful for about 2-3wks after anesthesia  . Congestive heart failure ([place])    takes Spironolactone daily  . Diabetes mellitus ([place])    takes Invokamet daily as well as Hum U  . History of colon polyps   . History of kidney stones   . Hyperlipidemia    takes Sanmina-SCI daily  . Hyperlipidemia 11/07/2013  . Hypertension    takes Benicar and Coreg daily  . ICD (implantable cardioverter-defibrillator) battery depletion   . ICD (implantable cardioverter-defibrillator) in place 11/07/2013  . Ischemic cardiomyopathy   . Ischemic cardiomyopathy   . Joint pain   . Myocardial infarction ([place]) 1997   on the OR table   . Nephrolithiasis   . Obstructive sleep apnea   . Peripheral neuropathy   . Pneumonia     as a child  . Spinal stenosis   . Thrombocytopenia ([place])   . Total knee replacement status 11/29/2014  . Type 2 diabetes mellitus with stage 3 chronic kidney disease ([place]) 05/19/2015  . Urinary frequency   . Urinary urgency     ROS:   All systems reviewed and negative except as noted in the HPI.   Past Surgical History:  Procedure Laterality Date  . CARDIAC CATHETERIZATION N/A 10/02/2015   Procedure: Left Heart Cath and Cors/Grafts Angiography;  Surgeon: Belva Crome, MD;  Location: [facility];  Service: Cardiovascular;  Laterality: N/A;  . COLONOSCOPY    . coronary artery bypass and graft  1997   x 5  . ESOPHAGOGASTRODUODENOSCOPY    . ICD placed    . LAMINECTOMY    . LITHOTRIPSY    . Right rotator cuff surgery    . TONSILLECTOMY     adenoids  . TOTAL KNEE ARTHROPLASTY Left   . TOTAL KNEE ARTHROPLASTY Right 11/29/2014   Procedure: RIGHT TOTAL KNEE ARTHROPLASTY;  Surgeon: Leandrew Koyanagi, MD;  Location: [facility];  Service: Orthopedics;  Laterality: Right;     Family History  Problem Relation Age of Onset  . Heart disease Unknown        No family history     Social History   Socioeconomic History  . Marital status: Married    Spouse name: Not on file  . Number of children: Not on file  . Years of education: Not on file  . Highest education level: Not on file  Social Needs  . Financial resource strain: Not on file  . Food insecurity - worry: Not on file  . Food insecurity - inability: Not on file  . Transportation needs - medical: Not on file  . Transportation needs - non-medical: Not on file  Occupational History    Comment: Retired from [employer]  . Smoking status: Never Smoker  . Smokeless tobacco: Never Used  Substance and Sexual Activity  . Alcohol use: Yes    Alcohol/week: 0.0 oz    Comment: once drink a month  . Drug use: No  . Sexual activity: Not on file  Other Topics Concern  . Not on file  Social History Narrative  . Not on  file     BP 140/70   Pulse 82   Ht 6' 1"  (1.854 m)   Wt (!) 329 lb (149.2 kg)   BMI 43.41 kg/m   Physical  Exam:  Morbidly obes appearing 77 yo man,  NAD HEENT: Unremarkable Neck:  6 cm JVD, no thyromegally Lymphatics:  No adenopathy Back:  No CVA tenderness Lungs:  Clear with no wheezes HEART:  Regular rate rhythm, no murmurs, no rubs, no clicks Abd:  soft, positive bowel sounds, no organomegally, no rebound, no guarding Ext:  2 plus pulses, no edema, no cyanosis, no clubbing Skin:  No rashes no nodules Neuro:  CN II through XII intact, motor grossly intact  EKG - atrial fib with a regular vr  DEVICE  Normal device function.  See PaceArt for details.   Assess/Plan: 1. Chronic systolic heart failure - he has some low output symptoms and I have asked the patient to reduce his dose of lasix in half. 2. ICD - his [employer]. [name] ICD is working normally. Will recheck in several months.  3. Obesity - he is encouraged to lose weight. 4. CAD - he is s/p CABG and appears to be doing well. Will follow.  Robert Gregory.D.

## 2017-12-08 ENCOUNTER — Encounter: Payer: Self-pay | Admitting: Internal Medicine

## 2017-12-08 ENCOUNTER — Telehealth: Payer: Self-pay | Admitting: Internal Medicine

## 2017-12-08 NOTE — Telephone Encounter (Signed)
Yes, both the flu and steroids can increase the sugars.  Continue: - Metformin ER 2000 mg with dinner  Increase:  - U500 insulin: - 100 >> 110 units before breakfast - 65 >> 70 units before lunch  - 50 >> 55 units before dinner if no lows at night  For the period of steroids. If his sugars remain high during the day, he can increase the insulin and am and for lunch by 5 more units.

## 2017-12-08 NOTE — Telephone Encounter (Signed)
Patient stated that he is feeling bad getting over the flu, dr put him on steroids, thinking that's' why b/s is running high. Today its running 82 to 270s Overall 160s to 170s morning Evening 100s  Please advise

## 2017-12-09 ENCOUNTER — Telehealth: Payer: Self-pay | Admitting: Internal Medicine

## 2017-12-09 NOTE — Telephone Encounter (Signed)
Called pt. No answer. LMTCB

## 2017-12-09 NOTE — Telephone Encounter (Signed)
Patient is returning your call.  

## 2017-12-12 NOTE — Telephone Encounter (Signed)
Called patient. Patient states he is feeling much better now.

## 2017-12-13 NOTE — Progress Notes (Signed)
HPI: FU coronary artery disease. Patient is status post coronary artery bypassing graft in 1997. Patient had a LIMA to the LAD, saphenous vein graft to the diagonal, saphenous vein graft to the circumflex and sequential saphenous vein graft to the PDA and posterior lateral. His procedure was performed at [facility]. He subsequently moved to [facility]. Echocardiogram in 2005 showed an ejection fraction of 38%, restrictive filling and trace tricuspid regurgitation. Patient had ICD placed previously. Also with h/o atrial fibrillation. Nuclear study 12/16 showed EF 50; prior inferior MI with mild peri-infarct ischemia and moderate ischemia in the anterolateral wall. Cardiac catheterization January 2017 showed severe three-vessel coronary disease. There was total occlusion of the saphenous vein graft to the obtuse marginal, total occlusion of the saphenous vein graft to diagonal and there was a 50-70% stenosis in the sequential vein graft to the PDA and left ventricular branch and the LIMA to the LAD was patent. Ejection fraction 35-45%. Medical therapy recommended. Abdominal ultrasound August 2017 showed no aneurysm. Since last seen, the patient has dyspnea with more extreme activities but not with routine activities. It is relieved with rest. It is not associated with chest pain. There is no orthopnea, PND. There is no syncope or palpitations. There is no exertional chest pain.   Current Outpatient Medications  Medication Sig Dispense Refill  . amoxicillin (AMOXIL) 500 MG capsule Take 500 mg by mouth as directed. 6 prior to dental work    . BD INSULIN SYRINGE U-500 31G X 6MM 0.5 ML MISC USE 1 SYRINGE TO INJECT UNDER THE SKIN THREE TIMES A DAY BEFORE MEALS 300 each 3  . BENICAR 40 MG tablet TAKE 1 TABLET TWICE A DAY 180 tablet 3  . carvedilol (COREG) 12.5 MG tablet TAKE 1 TABLET TWICE A DAY WITH MEALS 180 tablet 3  . cholecalciferol (VITAMIN D) 1000 units tablet Take 1,000 Units by mouth daily.     . Coenzyme Q10 200 MG TABS Take 1 tablet by mouth daily.    . Cranberry 200 MG CAPS Take 200 mg by mouth daily.     . ergocalciferol (VITAMIN D2) 50000 units capsule Take 50,000 Units by mouth once a week.    . ezetimibe (ZETIA) 10 MG tablet TAKE 1 TABLET DAILY 90 tablet 3  . furosemide (LASIX) 40 MG tablet Take 0.5 tablets (20 mg total) by mouth daily. 45 tablet 3  . insulin regular human CONCENTRATED (HUMULIN R) 500 UNIT/ML injection Inject up to 200 units a day as advised 3 vial 3  . KLOR-CON 10 10 MEQ tablet TAKE 2 TABLETS (20 MEQ TOTAL) TWICE A DAY 360 tablet 3  . metFORMIN (GLUCOPHAGE-XR) 500 MG 24 hr tablet TAKE 4 TABLETS (2000 MG) WITH DINNER 360 tablet 0  . Multiple Vitamins-Minerals (CENTRUM SILVER PO) Take 1 tablet by mouth daily.    . nitroGLYCERIN (NITROSTAT) 0.4 MG SL tablet DISSOLVE  1 TABLET UNDER THE TONGUE EVERY 5 MINUTES AS NEEDED FOR CHEST PAIN 25 tablet 3  . NON FORMULARY Methyfolate 2000 MCG daily    . omega-3 acid ethyl esters (LOVAZA) 1 g capsule Take 2 capsules twice a day 360 capsule 1  . pravastatin (PRAVACHOL) 80 MG tablet TAKE 1 TABLET DAILY 90 tablet 3  . PROAIR HFA 108 (90 Base) MCG/ACT inhaler Inhale 2 puffs into the lungs as needed.    . Probiotic Product (PROBIOTIC DAILY PO) Take 1 tablet by mouth daily.    Marland Kitchen spironolactone (ALDACTONE) 25 MG tablet TAKE  1 TABLET DAILY 90 tablet 3  . TRICOR 48 MG tablet TAKE 1 TABLET DAILY 90 tablet 2  . XARELTO 20 MG TABS tablet TAKE 1 TABLET DAILY WITH SUPPER 90 tablet 3   No current facility-administered medications for this visit.      Past Medical History:  Diagnosis Date  . Arthritis   . Atrial fibrillation ([place])    takes Xarelto daily  . CAD (coronary artery disease)   . Cardiomyopathy, ischemic 11/07/2013  . Cholelithiasis   . Chronic back pain    scoliosis/arthritis  . Complication of anesthesia    forgetful for about 2-3wks after anesthesia  . Congestive heart failure ([place])    takes Spironolactone  daily  . Diabetes mellitus ([place])    takes Invokamet daily as well as Hum U  . History of colon polyps   . History of kidney stones   . Hyperlipidemia    takes Sanmina-SCI daily  . Hyperlipidemia 11/07/2013  . Hypertension    takes Benicar and Coreg daily  . ICD (implantable cardioverter-defibrillator) battery depletion   . ICD (implantable cardioverter-defibrillator) in place 11/07/2013  . Ischemic cardiomyopathy   . Ischemic cardiomyopathy   . Joint pain   . Myocardial infarction ([place]) 1997   on the OR table   . Nephrolithiasis   . Obstructive sleep apnea   . Peripheral neuropathy   . Pneumonia    as a child  . Spinal stenosis   . Thrombocytopenia ([place])   . Total knee replacement status 11/29/2014  . Type 2 diabetes mellitus with stage 3 chronic kidney disease ([place]) 05/19/2015  . Urinary frequency   . Urinary urgency     Past Surgical History:  Procedure Laterality Date  . CARDIAC CATHETERIZATION N/A 10/02/2015   Procedure: Left Heart Cath and Cors/Grafts Angiography;  Surgeon: Belva Crome, MD;  Location: [facility];  Service: Cardiovascular;  Laterality: N/A;  . COLONOSCOPY    . coronary artery bypass and graft  1997   x 5  . ESOPHAGOGASTRODUODENOSCOPY    . ICD placed    . LAMINECTOMY    . LITHOTRIPSY    . Right rotator cuff surgery    . TONSILLECTOMY     adenoids  . TOTAL KNEE ARTHROPLASTY Left   . TOTAL KNEE ARTHROPLASTY Right 11/29/2014   Procedure: RIGHT TOTAL KNEE ARTHROPLASTY;  Surgeon: Leandrew Koyanagi, MD;  Location: [facility];  Service: Orthopedics;  Laterality: Right;    Social History   Socioeconomic History  . Marital status: Married    Spouse name: Not on file  . Number of children: Not on file  . Years of education: Not on file  . Highest education level: Not on file  Occupational History    Comment: Retired from [employer]  . Financial resource strain: Not on file  . Food insecurity:    Worry: Not on file     Inability: Not on file  . Transportation needs:    Medical: Not on file    Non-medical: Not on file  Tobacco Use  . Smoking status: Never Smoker  . Smokeless tobacco: Never Used  Substance and Sexual Activity  . Alcohol use: Yes    Alcohol/week: 0.0 oz    Comment: once drink a month  . Drug use: No  . Sexual activity: Not on file  Lifestyle  . Physical activity:    Days per week: Not on file    Minutes per session: Not on  file  . Stress: Not on file  Relationships  . Social connections:    Talks on phone: Not on file    Gets together: Not on file    Attends religious service: Not on file    Active member of club or organization: Not on file    Attends meetings of clubs or organizations: Not on file    Relationship status: Not on file  . Intimate partner violence:    Fear of current or ex partner: Not on file    Emotionally abused: Not on file    Physically abused: Not on file    Forced sexual activity: Not on file  Other Topics Concern  . Not on file  Social History Narrative  . Not on file    Family History  Problem Relation Age of Onset  . Heart disease Unknown        No family history    ROS: Back pain but no fevers or chills, productive cough, hemoptysis, dysphasia, odynophagia, melena, hematochezia, dysuria, hematuria, rash, seizure activity, orthopnea, PND, pedal edema, claudication. Remaining systems are negative.  Physical Exam: Well-developed morbidly obese in no acute distress.  Skin is warm and dry.  HEENT is normal.  Neck is supple.  Chest is clear to auscultation with normal expansion.  Cardiovascular exam is regular rate and rhythm.  Abdominal exam nontender or distended. No masses palpated. Extremities show 1+ edema. neuro grossly intact   A/P  1 permanent atrial fibrillation-plan to continue beta-blocker for rate control.  Continue Xarelto.  2 coronary artery disease-patient without chest pain.  Continue medical therapy with statin.  He is  not on aspirin given need for anticoagulation.  3 hypertension-blood pressure is elevated.  However he states it has been running low and his medications were recently decreased.  He states it is now 120s over 87.  We will follow and advance regimen as needed.  4 hyperlipidemia-continue statin.  5 ischemic cardiomyopathy-continue ARB and beta-blocker.  6 chronic combined systolic/diastolic congestive heart failure-patient doing well from a volume standpoint.  Continue present dose of diuretic.  Continue low-sodium diet and fluid restriction.  7 prior ICD-followed by electrophysiology.  8 morbid obesity-we discussed importance of weight loss.  Kirk Ruths, MD

## 2017-12-14 ENCOUNTER — Encounter: Payer: Self-pay | Admitting: Cardiology

## 2017-12-14 ENCOUNTER — Ambulatory Visit (INDEPENDENT_AMBULATORY_CARE_PROVIDER_SITE_OTHER): Payer: Medicare Other | Admitting: Cardiology

## 2017-12-14 VITALS — BP 149/78 | HR 78 | Ht 73.0 in | Wt 332.8 lb

## 2017-12-14 DIAGNOSIS — E7800 Pure hypercholesterolemia, unspecified: Secondary | ICD-10-CM | POA: Diagnosis not present

## 2017-12-14 DIAGNOSIS — I255 Ischemic cardiomyopathy: Secondary | ICD-10-CM

## 2017-12-14 DIAGNOSIS — I2510 Atherosclerotic heart disease of native coronary artery without angina pectoris: Secondary | ICD-10-CM | POA: Diagnosis not present

## 2017-12-14 DIAGNOSIS — I10 Essential (primary) hypertension: Secondary | ICD-10-CM

## 2017-12-14 DIAGNOSIS — I4821 Permanent atrial fibrillation: Secondary | ICD-10-CM

## 2017-12-14 DIAGNOSIS — I482 Chronic atrial fibrillation: Secondary | ICD-10-CM | POA: Diagnosis not present

## 2017-12-14 NOTE — Patient Instructions (Signed)
Medication Instructions:  Your physician recommends that you continue on your current medications as directed. Please refer to the Current Medication list given to you today.   Labwork: -None  Testing/Procedures: -None  Follow-Up: Your physician wants you to follow-up in: 6 months with Dr.Crenshaw.  You will receive a reminder letter in the mail two months in advance. If you don't receive a letter, please call our office to schedule the follow-up appointment.   Any Other Special Instructions Will Be Listed Below (If Applicable).     If you need a refill on your cardiac medications before your next appointment, please call your pharmacy.

## 2017-12-16 ENCOUNTER — Ambulatory Visit: Payer: Medicare Other | Admitting: Internal Medicine

## 2017-12-26 DIAGNOSIS — H25813 Combined forms of age-related cataract, bilateral: Secondary | ICD-10-CM | POA: Diagnosis not present

## 2017-12-26 DIAGNOSIS — H02831 Dermatochalasis of right upper eyelid: Secondary | ICD-10-CM | POA: Diagnosis not present

## 2017-12-26 DIAGNOSIS — H43811 Vitreous degeneration, right eye: Secondary | ICD-10-CM | POA: Diagnosis not present

## 2017-12-26 DIAGNOSIS — H40033 Anatomical narrow angle, bilateral: Secondary | ICD-10-CM | POA: Diagnosis not present

## 2017-12-26 DIAGNOSIS — H02834 Dermatochalasis of left upper eyelid: Secondary | ICD-10-CM | POA: Diagnosis not present

## 2017-12-26 DIAGNOSIS — H527 Unspecified disorder of refraction: Secondary | ICD-10-CM | POA: Diagnosis not present

## 2017-12-26 DIAGNOSIS — E119 Type 2 diabetes mellitus without complications: Secondary | ICD-10-CM | POA: Diagnosis not present

## 2018-01-02 DIAGNOSIS — E1142 Type 2 diabetes mellitus with diabetic polyneuropathy: Secondary | ICD-10-CM | POA: Diagnosis not present

## 2018-01-02 DIAGNOSIS — S93602A Unspecified sprain of left foot, initial encounter: Secondary | ICD-10-CM | POA: Diagnosis not present

## 2018-01-02 DIAGNOSIS — L853 Xerosis cutis: Secondary | ICD-10-CM | POA: Diagnosis not present

## 2018-01-02 DIAGNOSIS — M21622 Bunionette of left foot: Secondary | ICD-10-CM | POA: Diagnosis not present

## 2018-01-04 DIAGNOSIS — L408 Other psoriasis: Secondary | ICD-10-CM | POA: Diagnosis not present

## 2018-01-04 DIAGNOSIS — L570 Actinic keratosis: Secondary | ICD-10-CM | POA: Diagnosis not present

## 2018-01-04 DIAGNOSIS — B351 Tinea unguium: Secondary | ICD-10-CM | POA: Diagnosis not present

## 2018-01-04 DIAGNOSIS — D1801 Hemangioma of skin and subcutaneous tissue: Secondary | ICD-10-CM | POA: Diagnosis not present

## 2018-01-04 DIAGNOSIS — L821 Other seborrheic keratosis: Secondary | ICD-10-CM | POA: Diagnosis not present

## 2018-01-31 ENCOUNTER — Other Ambulatory Visit: Payer: Self-pay | Admitting: Cardiology

## 2018-01-31 DIAGNOSIS — I10 Essential (primary) hypertension: Secondary | ICD-10-CM | POA: Diagnosis not present

## 2018-01-31 DIAGNOSIS — G4733 Obstructive sleep apnea (adult) (pediatric): Secondary | ICD-10-CM | POA: Diagnosis not present

## 2018-01-31 DIAGNOSIS — R600 Localized edema: Secondary | ICD-10-CM | POA: Diagnosis not present

## 2018-01-31 DIAGNOSIS — E7800 Pure hypercholesterolemia, unspecified: Secondary | ICD-10-CM | POA: Diagnosis not present

## 2018-01-31 DIAGNOSIS — I2510 Atherosclerotic heart disease of native coronary artery without angina pectoris: Secondary | ICD-10-CM | POA: Diagnosis not present

## 2018-01-31 DIAGNOSIS — E1122 Type 2 diabetes mellitus with diabetic chronic kidney disease: Secondary | ICD-10-CM | POA: Diagnosis not present

## 2018-01-31 DIAGNOSIS — I4891 Unspecified atrial fibrillation: Secondary | ICD-10-CM | POA: Diagnosis not present

## 2018-01-31 DIAGNOSIS — N183 Chronic kidney disease, stage 3 (moderate): Secondary | ICD-10-CM | POA: Diagnosis not present

## 2018-01-31 DIAGNOSIS — Z1389 Encounter for screening for other disorder: Secondary | ICD-10-CM | POA: Diagnosis not present

## 2018-01-31 DIAGNOSIS — Z0000 Encounter for general adult medical examination without abnormal findings: Secondary | ICD-10-CM | POA: Diagnosis not present

## 2018-01-31 DIAGNOSIS — Z794 Long term (current) use of insulin: Secondary | ICD-10-CM | POA: Diagnosis not present

## 2018-02-17 ENCOUNTER — Encounter

## 2018-02-17 ENCOUNTER — Encounter: Payer: Self-pay | Admitting: Internal Medicine

## 2018-02-17 ENCOUNTER — Ambulatory Visit (INDEPENDENT_AMBULATORY_CARE_PROVIDER_SITE_OTHER): Payer: Medicare Other | Admitting: Internal Medicine

## 2018-02-17 VITALS — BP 124/62 | HR 90 | Ht 73.0 in | Wt 332.8 lb

## 2018-02-17 DIAGNOSIS — N183 Chronic kidney disease, stage 3 unspecified: Secondary | ICD-10-CM

## 2018-02-17 DIAGNOSIS — I255 Ischemic cardiomyopathy: Secondary | ICD-10-CM | POA: Diagnosis not present

## 2018-02-17 DIAGNOSIS — Z794 Long term (current) use of insulin: Secondary | ICD-10-CM

## 2018-02-17 DIAGNOSIS — E7800 Pure hypercholesterolemia, unspecified: Secondary | ICD-10-CM

## 2018-02-17 DIAGNOSIS — E1122 Type 2 diabetes mellitus with diabetic chronic kidney disease: Secondary | ICD-10-CM | POA: Diagnosis not present

## 2018-02-17 LAB — POCT GLYCOSYLATED HEMOGLOBIN (HGB A1C): Hemoglobin A1C: 8 % — AB (ref 4.0–5.6)

## 2018-02-17 MED ORDER — INSULIN GLARGINE 300 UNIT/ML ~~LOC~~ SOPN: 30.0000 [IU] | PEN_INJECTOR | Freq: Every day | SUBCUTANEOUS | 3 refills | Status: DC

## 2018-02-17 NOTE — Patient Instructions (Addendum)
Please continue: - Metformin ER 2000 mg with dinner  Please change:  - U500 insulin: - 110 units before breakfast - 70 units before lunch  - 40-55 units before dinner  Please start: - Toujeo 30 units at bedtime  Please return in 3 months with your sugar log.

## 2018-02-17 NOTE — Progress Notes (Signed)
Patient ID: Robert Gregory, male   DOB: [birth year], 77 y.o.   MRN: [id number]  HPI: Robert Gregory is a 77 y.o.-year-old male, returning for f/u for DM2, dx in 2004, insulin-dependent, uncontrolled, with complications (CAD - Ischemic CMP, s/p CABG x5 in 1997, CHF; CKD stage 3). Last visit 3 months ago.  Last hemoglobin A1c was: Lab Results  Component Value Date   HGBA1C 8.1 11/10/2017   HGBA1C 7.4 08/05/2017   HGBA1C 7.5 05/06/2017   Pt is on a regimen of: - Metformin ER 2000 mg with dinner - U500 insulin: - 100 units before breakfast - 70 units before lunch  - 50 units before dinner He was on Cinnamon 500 mg 2x a day He gained 50 lbs with Actos in the past.  He was on Invokana 100 mg in am in the past. We stopped Bydureon at last visit 2/2 severe constipation >> now resolved  Pt checks his sugars 4x times a day- Reviewed log: - am: 149-214, 234 >> 175-241 >> 159-242, 282, 326 - 2h after b'fast:  194 >> 216 >> 278 >> n/c - before lunch:147-261 >> 207-362 >> 109-333, 372 - 2h after lunch: 1191-314 >> 207-315 >> 233, 315 - before dinner: 84-204, 232 >> 173-295 >> 122-315 - 2h after dinner: 100-287 >> 196-278 >> 155-255, 366 - bedtime:  88-272 >> 201, 233 >> 137-271 - nighttime: 1116-162 >> 132 Lowest sugar: 132 >> 122; he has hypoglycemia awareness in the 70s. Highest sugar: 362 >> 376.  Glucometer: [employer] 2  Pt's meals are: - Breakfast: cereals + 2% milk, sausage + eggs + toast; still OJ - Lunch: soup + sandwich (lightest) - Dinner: meat + veggies + starch  - Snacks: 1- pm: peanuts, V8 juice  -+ CKD, last BUN/creatinine:  Lab Results  Component Value Date   BUN 34 (H) 11/07/2017   CREATININE 1.46 (H) 11/07/2017  On Benicar 80.  Reviewed GFR levels: Lab Results  Component Value Date   GFRNONAA 38 (L) 09/26/2017   GFRNONAA 61 (L) 12/01/2014   GFRNONAA 43 (L) 11/30/2014   GFRNONAA 49 (L) 11/19/2014   GFRNONAA 56 (L) 09/18/2014   GFRNONAA 37 (L) 09/11/2014    GFRNONAA 46 (L) 09/03/2014   GFRNONAA 56 (L) 07/31/2014   GFRNONAA 60 11/14/2013   -+ HL; last set of lipids: Lab Results  Component Value Date   CHOL 123 08/03/2017   HDL 26 (L) 08/03/2017   LDLCALC 26 08/03/2017   TRIG 355 (H) 08/03/2017   CHOLHDL 4.7 08/03/2017  On pravastatin 80. - last eye exam was in   12/2017 >> No DR - No numbness and tingling in his feet.  He has a history of a right diabetic foot ulcer developed after removal of a callus >> followed by Dr. Zigmund Daniel (Wound Care).  This is healed.  ROS: Constitutional: no weight gain/no weight loss, no fatigue, no subjective hyperthermia, no subjective hypothermia Eyes: no blurry vision, no xerophthalmia ENT: no sore throat, no nodules palpated in throat, no dysphagia, no odynophagia, no hoarseness Cardiovascular: no CP/no SOB/no palpitations/+ leg swelling Respiratory: no cough/no SOB/no wheezing Gastrointestinal: no N/no V/no D/no C/no acid reflux Musculoskeletal: no muscle aches/no joint aches Skin: no rashes, no hair loss Neurological: no tremors/no numbness/no tingling/no dizziness  I reviewed pt's medications, allergies, PMH, social hx, family hx, and changes were documented in the history of present illness. Otherwise, unchanged from my initial visit note.  Past Medical History:  Diagnosis Date  . Arthritis   .  Atrial fibrillation ([place])    takes Xarelto daily  . CAD (coronary artery disease)   . Cardiomyopathy, ischemic 11/07/2013  . Cholelithiasis   . Chronic back pain    scoliosis/arthritis  . Complication of anesthesia    forgetful for about 2-3wks after anesthesia  . Congestive heart failure ([place])    takes Spironolactone daily  . Diabetes mellitus ([place])    takes Invokamet daily as well as Hum U  . History of colon polyps   . History of kidney stones   . Hyperlipidemia    takes San[employer]-SCI daily  . Hyperlipidemia 11/07/2013  . Hypertension    takes Benicar and Coreg daily  . ICD  (implantable cardioverter-defibrillator) battery depletion   . ICD (implantable cardioverter-defibrillator) in place 11/07/2013  . Ischemic cardiomyopathy   . Ischemic cardiomyopathy   . Joint pain   . Myocardial infarction ([place]) 1997   on the OR table   . Nephrolithiasis   . Obstructive sleep apnea   . Peripheral neuropathy   . Pneumonia    as a child  . Spinal stenosis   . Thrombocytopenia ([place])   . Total knee replacement status 11/29/2014  . Type 2 diabetes mellitus with stage 3 chronic kidney disease ([place]) 05/19/2015  . Urinary frequency   . Urinary urgency    Past Surgical History:  Procedure Laterality Date  . CARDIAC CATHETERIZATION N/A 10/02/2015   Procedure: Left Heart Cath and Cors/Grafts Angiography;  Surgeon: Belva Crome, MD;  Location: [facility];  Service: Cardiovascular;  Laterality: N/A;  . COLONOSCOPY    . coronary artery bypass and graft  1997   x 5  . ESOPHAGOGASTRODUODENOSCOPY    . ICD placed    . LAMINECTOMY    . LITHOTRIPSY    . Right rotator cuff surgery    . TONSILLECTOMY     adenoids  . TOTAL KNEE ARTHROPLASTY Left   . TOTAL KNEE ARTHROPLASTY Right 11/29/2014   Procedure: RIGHT TOTAL KNEE ARTHROPLASTY;  Surgeon: Leandrew Koyanagi, MD;  Location: [facility];  Service: Orthopedics;  Laterality: Right;   Social History   Social History  . Marital status: Married    Spouse name: N/A  . Number of children: N/A   Occupational History  .      Retired from [employer]  . Smoking status: Never Smoker  . Smokeless tobacco: Never Used  . Alcohol use 0.0 oz/week     Comment: once drink a month  . Drug use: No   Current Outpatient Medications on File Prior to Visit  Medication Sig Dispense Refill  . amoxicillin (AMOXIL) 500 MG capsule Take 500 mg by mouth as directed. 6 prior to dental work    . BD INSULIN SYRINGE U-500 31G X 6MM 0.5 ML MISC USE 1 SYRINGE TO INJECT UNDER THE SKIN THREE TIMES A DAY BEFORE MEALS 300 each 3   . BENICAR 40 MG tablet TAKE 1 TABLET TWICE A DAY 180 tablet 3  . carvedilol (COREG) 12.5 MG tablet TAKE 1 TABLET TWICE A DAY WITH MEALS 180 tablet 3  . cholecalciferol (VITAMIN D) 1000 units tablet Take 1,000 Units by mouth daily.    . Coenzyme Q10 200 MG TABS Take 1 tablet by mouth daily.    . Cranberry 200 MG CAPS Take 200 mg by mouth daily.     . ergocalciferol (VITAMIN D2) 50000 units capsule Take 50,000 Units by mouth once a week.    Marland Kitchen  ezetimibe (ZETIA) 10 MG tablet TAKE 1 TABLET DAILY 90 tablet 3  . furosemide (LASIX) 40 MG tablet Take 0.5 tablets (20 mg total) by mouth daily. 45 tablet 3  . insulin regular human CONCENTRATED (HUMULIN R) 500 UNIT/ML injection Inject up to 200 units a day as advised 3 vial 3  . KLOR-CON 10 10 MEQ tablet TAKE 2 TABLETS (20 MEQ TOTAL) TWICE A DAY 360 tablet 3  . metFORMIN (GLUCOPHAGE-XR) 500 MG 24 hr tablet TAKE 4 TABLETS (2000 MG) WITH DINNER 360 tablet 0  . Multiple Vitamins-Minerals (CENTRUM SILVER PO) Take 1 tablet by mouth daily.    . nitroGLYCERIN (NITROSTAT) 0.4 MG SL tablet DISSOLVE  1 TABLET UNDER THE TONGUE EVERY 5 MINUTES AS NEEDED FOR CHEST PAIN 25 tablet 3  . NON FORMULARY Methyfolate 2000 MCG daily    . omega-3 acid ethyl esters (LOVAZA) 1 g capsule Take 2 capsules twice a day 360 capsule 1  . pravastatin (PRAVACHOL) 80 MG tablet TAKE 1 TABLET DAILY 90 tablet 3  . PROAIR HFA 108 (90 Base) MCG/ACT inhaler Inhale 2 puffs into the lungs as needed.    . Probiotic Product (PROBIOTIC DAILY PO) Take 1 tablet by mouth daily.    Marland Kitchen spironolactone (ALDACTONE) 25 MG tablet TAKE 1 TABLET DAILY 90 tablet 3  . TRICOR 48 MG tablet TAKE 1 TABLET DAILY 90 tablet 2  . XARELTO 20 MG TABS tablet TAKE 1 TABLET DAILY WITH SUPPER 90 tablet 3   No current facility-administered medications on file prior to visit.    Allergies  Allergen Reactions  . Adhesive [Tape]     Rash   . Iodine     flushing  . Seasonal Ic [Cholestatin]   . Contrast Media [Iodinated  Diagnostic Agents] Rash and Other (See Comments)    flushing EXTREME FLUSHING    Family History  Problem Relation Age of Onset  . Heart disease Unknown        No family history   PE: There were no vitals taken for this visit. There is no height or weight on file to calculate BMI. Wt Readings from Last 3 Encounters:  12/14/17 (!) 332 lb 12.8 oz (151 kg)  12/06/17 (!) 329 lb (149.2 kg)  11/10/17 (!) 324 lb 6 oz (147.1 kg)   Constitutional: Obese, in NAD Eyes: PERRLA, EOMI, no exophthalmos ENT: moist mucous membranes, no thyromegaly, no cervical lymphadenopathy Cardiovascular: RRR, No MRG, + BLE swelling Respiratory: CTA B Gastrointestinal: abdomen soft, NT, ND, BS+ Musculoskeletal: no deformities, strength intact in all 4 Skin: moist, warm, no rashes Neurological:  + mild tremor with outstretched hands, DTR normal in all 4   ASSESSMENT: 1. DM2, insulin-dependent, uncontrolled, with complications - CAD, s/p CABG 5x in 1997 (Dr Roxy Horseman), iCMP >> CHF (Dr Stanford Breed) - CKD stage 3   2. Obesity class 3  3. HL  PLAN:  1. Patient with long-standing, uncontrolled, type 2 diabetes, very insulin resistant, on U500 insulin regimen, metformin and previously SGLT2 inhibitor, which we had to stop due to dehydration and increased GFR.  His sugars were high at last visit so we increased his U500 insulin doses then and we also discussed about the importance of improving his diet.  However, since then, he called [place] with higher blood sugar so we had to increase his insulin doses even further. - At this visit, sugars are still high in the morning and they are slightly better later in the day-  - He had mild low blood  sugars at night (when he checked he was in the 120s, but he was feeling poorly and had hypoglycemic symptoms).  Therefore, I advised him to reduce the dose of his U500 insulin with dinner to 40 units with regular meals and only go to 55 if he has a large meal or he eats out.  However, to  improve the sugars in the morning, I advised him to add Toujeo at bedtime.  We will start this 30 units and increase as needed to target a.m. blood sugar lower than 150 and, of course, do not have hypoglycemia at night - I suggested to:  Patient Instructions  Please continue: - Metformin ER 2000 mg with dinner  Please change:  - U500 insulin: - 110 units before breakfast - 70 units before lunch  - 40-55 units before dinner  Please start: - Toujeo 30 units at bedtime  Please return in 3 months with your sugar log.   - today, HbA1c is 8% (slightly better) - continue checking sugars at different times of the day - check 3x a day, rotating checks - advised for yearly eye exams >> he is UTD - Return to clinic in 3 mo with sugar log   2. Obesity class 3 -  his weight remains very high, +8 pounds since last visit - We did discuss at last visit about the benefits of gastric bypass surgery, but he refused - We could not continue him on GLP-1 receptor agonist due to severe constipation and unfortunately we also had to stop Jardiance due to dehydration and lower GFR  3. HL - Reviewed latest lipid panel from 07/2017: LDL at goal, but high triglycerides and low HDL - Continues high-dose pravastatin without side effects.  Philemon Kingdom, MD PhD [facility] Endocrinology

## 2018-02-22 ENCOUNTER — Ambulatory Visit (INDEPENDENT_AMBULATORY_CARE_PROVIDER_SITE_OTHER): Payer: Medicare Other | Admitting: *Deleted

## 2018-02-22 DIAGNOSIS — I5022 Chronic systolic (congestive) heart failure: Secondary | ICD-10-CM

## 2018-02-22 NOTE — Progress Notes (Signed)
Remote ICD transmission.   

## 2018-02-26 ENCOUNTER — Other Ambulatory Visit: Payer: Self-pay | Admitting: Internal Medicine

## 2018-02-26 ENCOUNTER — Encounter: Payer: Self-pay | Admitting: Internal Medicine

## 2018-03-07 ENCOUNTER — Encounter: Payer: Self-pay | Admitting: Internal Medicine

## 2018-03-08 ENCOUNTER — Ambulatory Visit (INDEPENDENT_AMBULATORY_CARE_PROVIDER_SITE_OTHER): Payer: Medicare Other | Admitting: Orthopaedic Surgery

## 2018-03-08 ENCOUNTER — Ambulatory Visit (INDEPENDENT_AMBULATORY_CARE_PROVIDER_SITE_OTHER): Payer: Medicare Other

## 2018-03-08 ENCOUNTER — Encounter (INDEPENDENT_AMBULATORY_CARE_PROVIDER_SITE_OTHER): Payer: Self-pay | Admitting: Orthopaedic Surgery

## 2018-03-08 ENCOUNTER — Telehealth: Payer: Self-pay

## 2018-03-08 DIAGNOSIS — I255 Ischemic cardiomyopathy: Secondary | ICD-10-CM

## 2018-03-08 DIAGNOSIS — Z96651 Presence of right artificial knee joint: Secondary | ICD-10-CM

## 2018-03-08 NOTE — Telephone Encounter (Signed)
OK, we can use Lantus then.

## 2018-03-08 NOTE — Progress Notes (Signed)
Office Visit Note   Patient: Robert Gregory           Date of Birth: June 07, 1941           MRN: [id number] Visit Date: 03/08/2018              Requested by: Seward Carol, MD [address] [place], [place] [address] PCP: Seward Carol, MD   Assessment & Plan: Visit Diagnoses:  1. H/O total knee replacement, right     Plan: Patient is 3 years status post right total knee replacement and doing well.  At this point I would like to see him back in 2 years for his 5-year follow-up.  In the meantime he knows he can cause any time should you have any questions or concerns.  Follow-Up Instructions: Return in about 2 years (around 03/08/2020).   Orders:  Orders Placed This Encounter  Procedures  . XR Knee 1-2 Views Right   No orders of the defined types were placed in this encounter.     Procedures: No procedures performed   Clinical Data: No additional findings.   Subjective: Chief Complaint  Patient presents with  . Right Knee - Follow-up    Patient is a very pleasant 77 year old gentleman who is 3 years status post right total knee replacement.  He is doing well.  He denies any complaints.  He had some unexplained pain about 3 weeks ago that quickly resolved after a day.  Denies any constitutional symptoms.   Review of Systems   Objective: Vital Signs: There were no vitals taken for this visit.  Physical Exam  Ortho Exam Right knee exam shows a fully healed surgical scar.  No joint effusion.  Excellent range of motion. Specialty Comments:  No specialty comments available.  Imaging: Xr Knee 1-2 Views Right  Result Date: 03/08/2018 Stable total knee replacement in good alignment     PMFS History: Patient Active Problem List   Diagnosis Date Noted  . Obesity, Class III, BMI 40-49.9 (morbid obesity) ([place]) 05/06/2017  . Splenomegaly, not elsewhere classified 10/27/2016  . Renal mass, left 10/27/2016  . Abnormal nuclear stress test   .  Obstructive sleep apnea   . Nephrolithiasis   . Thrombocytopenia ([place])   . Cholelithiasis   . ICD (implantable cardioverter-defibrillator) battery depletion   . Ischemic cardiomyopathy   . Hypertension   . Myocardial infarction ([place])   . Pneumonia   . Peripheral neuropathy   . Joint pain   . Chronic back pain   . History of colon polyps   . Urinary frequency   . History of kidney stones   . Type 2 diabetes mellitus with stage 3 chronic kidney disease ([place]) 05/19/2015  . H/O total knee replacement, right 11/29/2014  . Pre-operative cardiovascular examination 11/20/2014  . CAD (coronary artery disease) 11/07/2013  . Atrial fibrillation ([place]) 11/07/2013  . ICD (implantable cardioverter-defibrillator) in place 11/07/2013  . Essential hypertension 11/07/2013  . Hyperlipidemia 11/07/2013  . Cardiomyopathy, ischemic 11/07/2013   Past Medical History:  Diagnosis Date  . Arthritis   . Atrial fibrillation ([place])    takes Xarelto daily  . CAD (coronary artery disease)   . Cardiomyopathy, ischemic 11/07/2013  . Cholelithiasis   . Chronic back pain    scoliosis/arthritis  . Complication of anesthesia    forgetful for about 2-3wks after anesthesia  . Congestive heart failure ([place])    takes Spironolactone daily  . Diabetes mellitus ([place])    takes  Invokamet daily as well as Hum U  . History of colon polyps   . History of kidney stones   . Hyperlipidemia    takes Sanmina-SCI daily  . Hyperlipidemia 11/07/2013  . Hypertension    takes Benicar and Coreg daily  . ICD (implantable cardioverter-defibrillator) battery depletion   . ICD (implantable cardioverter-defibrillator) in place 11/07/2013  . Ischemic cardiomyopathy   . Ischemic cardiomyopathy   . Joint pain   . Myocardial infarction ([place]) 1997   on the OR table   . Nephrolithiasis   . Obstructive sleep apnea   . Peripheral neuropathy   . Pneumonia    as a child  . Spinal stenosis   . Thrombocytopenia ([place])     . Total knee replacement status 11/29/2014  . Type 2 diabetes mellitus with stage 3 chronic kidney disease ([place]) 05/19/2015  . Urinary frequency   . Urinary urgency     Family History  Problem Relation Age of Onset  . Heart disease Unknown        No family history    Past Surgical History:  Procedure Laterality Date  . CARDIAC CATHETERIZATION N/A 10/02/2015   Procedure: Left Heart Cath and Cors/Grafts Angiography;  Surgeon: Belva Crome, MD;  Location: [facility];  Service: Cardiovascular;  Laterality: N/A;  . COLONOSCOPY    . coronary artery bypass and graft  1997   x 5  . ESOPHAGOGASTRODUODENOSCOPY    . ICD placed    . LAMINECTOMY    . LITHOTRIPSY    . Right rotator cuff surgery    . TONSILLECTOMY     adenoids  . TOTAL KNEE ARTHROPLASTY Left   . TOTAL KNEE ARTHROPLASTY Right 11/29/2014   Procedure: RIGHT TOTAL KNEE ARTHROPLASTY;  Surgeon: Leandrew Koyanagi, MD;  Location: [facility];  Service: Orthopedics;  Laterality: Right;   Social History   Occupational History    Comment: Retired from [employer]  . Smoking status: Never Smoker  . Smokeless tobacco: Never Used  Substance and Sexual Activity  . Alcohol use: Yes    Alcohol/week: 0.0 oz    Comment: once drink a month  . Drug use: No  . Sexual activity: Not on file

## 2018-03-08 NOTE — Telephone Encounter (Signed)
Insurance denied Toujeo without using over 100 units a day of Lantus.Please advise

## 2018-03-09 MED ORDER — INSULIN GLARGINE 100 UNIT/ML SOLOSTAR PEN: 30.0000 [IU] | PEN_INJECTOR | Freq: Every day | SUBCUTANEOUS | 0 refills | Status: DC

## 2018-03-09 NOTE — Telephone Encounter (Signed)
Sent, please review mychart message with pt if any concerns

## 2018-03-11 ENCOUNTER — Other Ambulatory Visit: Payer: Self-pay | Admitting: Cardiology

## 2018-03-11 DIAGNOSIS — E785 Hyperlipidemia, unspecified: Secondary | ICD-10-CM

## 2018-03-11 DIAGNOSIS — I2510 Atherosclerotic heart disease of native coronary artery without angina pectoris: Secondary | ICD-10-CM

## 2018-03-11 DIAGNOSIS — Z95810 Presence of automatic (implantable) cardiac defibrillator: Secondary | ICD-10-CM

## 2018-03-11 DIAGNOSIS — I480 Paroxysmal atrial fibrillation: Secondary | ICD-10-CM

## 2018-03-11 DIAGNOSIS — I10 Essential (primary) hypertension: Secondary | ICD-10-CM

## 2018-03-11 DIAGNOSIS — I2583 Coronary atherosclerosis due to lipid rich plaque: Principal | ICD-10-CM

## 2018-03-11 DIAGNOSIS — I255 Ischemic cardiomyopathy: Secondary | ICD-10-CM

## 2018-03-13 NOTE — Telephone Encounter (Signed)
Rx request sent to pharmacy.  

## 2018-03-27 ENCOUNTER — Inpatient Hospital Stay: Payer: Medicare Other

## 2018-03-27 ENCOUNTER — Encounter: Payer: Self-pay | Admitting: Hematology & Oncology

## 2018-03-27 ENCOUNTER — Ambulatory Visit (HOSPITAL_BASED_OUTPATIENT_CLINIC_OR_DEPARTMENT_OTHER)
Admission: RE | Admit: 2018-03-27 | Discharge: 2018-03-27 | Disposition: A | Discharge status: Home / Self Care | Payer: Medicare Other | Source: Ambulatory Visit | Attending: Hematology & Oncology | Admitting: Hematology & Oncology

## 2018-03-27 ENCOUNTER — Other Ambulatory Visit: Payer: Self-pay

## 2018-03-27 ENCOUNTER — Inpatient Hospital Stay: Payer: Medicare Other | Attending: Hematology & Oncology | Admitting: Hematology & Oncology

## 2018-03-27 VITALS — BP 143/66 | HR 79 | Temp 98.3°F | Resp 20 | Wt 334.0 lb

## 2018-03-27 DIAGNOSIS — K8020 Calculus of gallbladder without cholecystitis without obstruction: Secondary | ICD-10-CM | POA: Insufficient documentation

## 2018-03-27 DIAGNOSIS — I255 Ischemic cardiomyopathy: Secondary | ICD-10-CM

## 2018-03-27 DIAGNOSIS — N281 Cyst of kidney, acquired: Secondary | ICD-10-CM | POA: Diagnosis not present

## 2018-03-27 DIAGNOSIS — D649 Anemia, unspecified: Secondary | ICD-10-CM | POA: Diagnosis not present

## 2018-03-27 DIAGNOSIS — E119 Type 2 diabetes mellitus without complications: Secondary | ICD-10-CM | POA: Insufficient documentation

## 2018-03-27 DIAGNOSIS — Z794 Long term (current) use of insulin: Secondary | ICD-10-CM | POA: Diagnosis not present

## 2018-03-27 DIAGNOSIS — R161 Splenomegaly, not elsewhere classified: Secondary | ICD-10-CM | POA: Diagnosis not present

## 2018-03-27 DIAGNOSIS — N189 Chronic kidney disease, unspecified: Secondary | ICD-10-CM | POA: Insufficient documentation

## 2018-03-27 DIAGNOSIS — K769 Liver disease, unspecified: Secondary | ICD-10-CM

## 2018-03-27 DIAGNOSIS — D631 Anemia in chronic kidney disease: Secondary | ICD-10-CM | POA: Diagnosis not present

## 2018-03-27 DIAGNOSIS — N183 Chronic kidney disease, stage 3 (moderate): Secondary | ICD-10-CM

## 2018-03-27 DIAGNOSIS — Z79899 Other long term (current) drug therapy: Secondary | ICD-10-CM | POA: Diagnosis not present

## 2018-03-27 DIAGNOSIS — Z7901 Long term (current) use of anticoagulants: Secondary | ICD-10-CM | POA: Insufficient documentation

## 2018-03-27 DIAGNOSIS — D696 Thrombocytopenia, unspecified: Secondary | ICD-10-CM

## 2018-03-27 LAB — CBC WITH DIFFERENTIAL (CANCER CENTER ONLY)
Basophils Absolute: 0 10*3/uL (ref 0.0–0.1)
Basophils Relative: 0 %
EOS ABS: 0.2 10*3/uL (ref 0.0–0.5)
EOS PCT: 4 %
HCT: 35.9 % — ABNORMAL LOW (ref 38.7–49.9)
HEMOGLOBIN: 11.5 g/dL — AB (ref 13.0–17.1)
LYMPHS ABS: 0.7 10*3/uL — AB (ref 0.9–3.3)
Lymphocytes Relative: 16 %
MCH: 29.9 pg (ref 28.0–33.4)
MCHC: 32 g/dL (ref 32.0–35.9)
MCV: 93.5 fL (ref 82.0–98.0)
MONOS PCT: 12 %
Monocytes Absolute: 0.5 10*3/uL (ref 0.1–0.9)
NEUTROS PCT: 68 %
Neutro Abs: 3 10*3/uL (ref 1.5–6.5)
Platelet Count: 64 10*3/uL — ABNORMAL LOW (ref 145–400)
RBC: 3.84 MIL/uL — AB (ref 4.20–5.70)
RDW: 15.8 % — ABNORMAL HIGH (ref 11.1–15.7)
WBC Count: 4.3 10*3/uL (ref 4.0–10.0)

## 2018-03-27 LAB — CMP (CANCER CENTER ONLY)
ALK PHOS: 50 U/L (ref 26–84)
ALT: 47 U/L (ref 10–47)
ANION GAP: 5 (ref 5–15)
AST: 50 U/L — ABNORMAL HIGH (ref 11–38)
Albumin: 3.7 g/dL (ref 3.5–5.0)
BUN: 35 mg/dL — ABNORMAL HIGH (ref 7–22)
CO2: 28 mmol/L (ref 18–33)
Calcium: 9.9 mg/dL (ref 8.0–10.3)
Chloride: 104 mmol/L (ref 98–108)
Creatinine: 1.2 mg/dL (ref 0.60–1.20)
Glucose, Bld: 252 mg/dL — ABNORMAL HIGH (ref 73–118)
Potassium: 4.7 mmol/L (ref 3.3–4.7)
Sodium: 137 mmol/L (ref 128–145)
TOTAL PROTEIN: 7 g/dL (ref 6.4–8.1)
Total Bilirubin: 0.6 mg/dL (ref 0.2–1.6)

## 2018-03-27 LAB — SAVE SMEAR

## 2018-03-27 LAB — PLATELET BY CITRATE

## 2018-03-27 NOTE — Progress Notes (Signed)
Hematology and Oncology Follow Up Visit  SALIF TAY [id number] 04/22/1941 77 y.o. 03/27/2018   Principle Diagnosis:   Chronic thrombocytopenia secondary to splenomegaly  1.57 m exophytic lesion lower pole left kidney  Current Therapy:    Observation     Interim History:  Mr. Shaff is back for follow-up. We got ultrasound of his abdomen today. This actually shows that his spleen is stable in size.  I think the volume of the spleen is 1116 cm3.  The exophytic lesion on the left kidney also is about the same size.  It is 1.5 cm.  Unfortunately, the radiologist made a comment that there is some new non-distinct lesions in the liver.  We will have to evaluate this.  He has a implantable defibrillator so we cannot use an MRI.  We will have to use a CT scan.  I would think that what we are seeing probably is more so related to fatty infiltration.  Since we last saw him, he has had no problems.  He has had no bleeding or bruising.  He has had no cough or shortness of breath.  He has had no change in bowel or bladder habits.  He has had no hematuria.  Overall, his performance status is ECOG 1.    Medications:  Current Outpatient Medications:  .  amoxicillin (AMOXIL) 500 MG capsule, Take 500 mg by mouth as directed. 6 prior to dental work, Disp: , Rfl:  .  BD INSULIN SYRINGE U-500 31G X 6MM 0.5 ML MISC, USE 1 SYRINGE TO INJECT UNDER THE SKIN THREE TIMES A DAY BEFORE MEALS, Disp: 300 each, Rfl: 3 .  BENICAR 40 MG tablet, TAKE 1 TABLET TWICE A DAY, Disp: 180 tablet, Rfl: 3 .  carvedilol (COREG) 12.5 MG tablet, TAKE 1 TABLET TWICE A DAY WITH MEALS, Disp: 180 tablet, Rfl: 3 .  cholecalciferol (VITAMIN D) 1000 units tablet, Take 1,000 Units by mouth daily., Disp: , Rfl:  .  Coenzyme Q10 200 MG TABS, Take 1 tablet by mouth daily., Disp: , Rfl:  .  Cranberry 200 MG CAPS, Take 200 mg by mouth daily. , Disp: , Rfl:  .  ergocalciferol (VITAMIN D2) 50000 units capsule, Take 50,000 Units by mouth once  a week., Disp: , Rfl:  .  ezetimibe (ZETIA) 10 MG tablet, TAKE 1 TABLET DAILY, Disp: 90 tablet, Rfl: 3 .  furosemide (LASIX) 40 MG tablet, TAKE 1 TABLET DAILY, Disp: 90 tablet, Rfl: 3 .  Insulin Glargine (LANTUS SOLOSTAR) 100 UNIT/ML Solostar Pen, Inject 30 Units into the skin daily at 10 pm., Disp: 27 mL, Rfl: 0 .  insulin regular human CONCENTRATED (HUMULIN R) 500 UNIT/ML injection, Inject up to 200 units a day as advised, Disp: 3 vial, Rfl: 3 .  KLOR-CON 10 10 MEQ tablet, TAKE 2 TABLETS (20 MEQ TOTAL) TWICE A DAY, Disp: 360 tablet, Rfl: 3 .  metFORMIN (GLUCOPHAGE-XR) 500 MG 24 hr tablet, TAKE 4 TABLETS (2000 MG) WITH DINNER, Disp: 360 tablet, Rfl: 0 .  Multiple Vitamins-Minerals (CENTRUM SILVER PO), Take 1 tablet by mouth daily., Disp: , Rfl:  .  nitroGLYCERIN (NITROSTAT) 0.4 MG SL tablet, DISSOLVE  1 TABLET UNDER THE TONGUE EVERY 5 MINUTES AS NEEDED FOR CHEST PAIN, Disp: 25 tablet, Rfl: 3 .  NON FORMULARY, Methyfolate 2000 MCG daily, Disp: , Rfl:  .  omega-3 acid ethyl esters (LOVAZA) 1 g capsule, Take 2 capsules twice a day, Disp: 360 capsule, Rfl: 1 .  pravastatin (PRAVACHOL) 80 MG tablet,  TAKE 1 TABLET DAILY, Disp: 90 tablet, Rfl: 3 .  PROAIR HFA 108 (90 Base) MCG/ACT inhaler, Inhale 2 puffs into the lungs as needed., Disp: , Rfl:  .  Probiotic Product (PROBIOTIC DAILY PO), Take 1 tablet by mouth daily., Disp: , Rfl:  .  spironolactone (ALDACTONE) 25 MG tablet, TAKE 1 TABLET DAILY, Disp: 90 tablet, Rfl: 3 .  TRICOR 48 MG tablet, TAKE 1 TABLET DAILY, Disp: 90 tablet, Rfl: 2 .  XARELTO 20 MG TABS tablet, TAKE 1 TABLET DAILY WITH SUPPER, Disp: 90 tablet, Rfl: 3  Allergies:  Allergies  Allergen Reactions  . Iodine     flushing  . Seasonal Ic [Cholestatin]   . Contrast Media [Iodinated Diagnostic Agents] Rash and Other (See Comments)    flushing EXTREME FLUSHING     Past Medical History, Surgical history, Social history, and Family History were reviewed and updated.  Review of  Systems: Review of Systems  Constitutional: Negative.   HENT: Negative.   Eyes: Negative.   Respiratory: Negative.   Cardiovascular: Negative.   Gastrointestinal: Negative.   Genitourinary: Negative.   Musculoskeletal: Negative.   Skin: Negative.   Neurological: Negative.   Endo/Heme/Allergies: Negative.   Psychiatric/Behavioral: Negative.     Physical Exam:  weight is 334 lb (151.5 kg) (abnormal). His oral temperature is 98.3 F (36.8 C). His blood pressure is 143/66 (abnormal) and his pulse is 79. His respiration is 20.   Wt Readings from Last 3 Encounters:  03/27/18 (!) 334 lb (151.5 kg)  02/17/18 (!) 332 lb 12.8 oz (151 kg)  12/14/17 (!) 332 lb 12.8 oz (151 kg)     Physical Exam  Constitutional: He is oriented to person, place, and time.  HENT:  Head: Normocephalic and atraumatic.  Mouth/Throat: Oropharynx is clear and moist.  Eyes: Pupils are equal, round, and reactive to light. EOM are normal.  Neck: Normal range of motion.  Cardiovascular: Normal rate, regular rhythm and normal heart sounds.  Pulmonary/Chest: Effort normal and breath sounds normal.  Abdominal: Soft. Bowel sounds are normal.  Musculoskeletal: Normal range of motion. He exhibits no edema, tenderness or deformity.  Lymphadenopathy:    He has no cervical adenopathy.  Neurological: He is alert and oriented to person, place, and time.  Skin: Skin is warm and dry. No rash noted. No erythema.  Psychiatric: He has a normal mood and affect. His behavior is normal. Judgment and thought content normal.  Vitals reviewed.    Lab Results  Component Value Date   WBC 4.3 03/27/2018   HGB 11.5 (L) 03/27/2018   HCT 35.9 (L) 03/27/2018   MCV 93.5 03/27/2018   PLT 64 (L) 03/27/2018     Chemistry      Component Value Date/Time   NA 140 11/07/2017 1455   NA 140 02/28/2017 1041   NA 141 04/21/2016 0852   K 4.8 11/07/2017 1455   K 4.0 02/28/2017 1041   K 4.4 04/21/2016 0852   CL 102 11/07/2017 1455   CL  105 02/28/2017 1041   CO2 29 11/07/2017 1455   CO2 25 02/28/2017 1041   CO2 22 04/21/2016 0852   BUN 34 (H) 11/07/2017 1455   BUN 30 (H) 02/28/2017 1041   BUN 29.0 (H) 04/21/2016 0852   CREATININE 1.46 (H) 11/07/2017 1455   CREATININE 1.6 (H) 04/21/2016 0852      Component Value Date/Time   CALCIUM 9.7 11/07/2017 1455   CALCIUM 9.7 02/28/2017 1041   CALCIUM 10.2 04/21/2016 0852  ALKPHOS 57 09/26/2017 0936   ALKPHOS 52 02/28/2017 1041   ALKPHOS 51 04/21/2016 0852   AST 41 (H) 09/26/2017 0936   AST 39 (H) 04/21/2016 0852   ALT 50 09/26/2017 0936   ALT 51 (H) 02/28/2017 1041   ALT 46 04/21/2016 0852   BILITOT 0.6 09/26/2017 0936   BILITOT 0.71 04/21/2016 0852         Impression and Plan: Mr. Sebesta is a 77 year old white male. He has moderate thrombocytopenia.   I am a little bit worried that he has some new anemia.  He is diabetic.  I do think that this probably is related to his diabetes.  We are checking an EPO level on him.  I will also check some iron levels.  We will have to set him up with an CT scan.  Again he cannot have an MRI because of the implantable defibrillator.  We will still make sure we see him back in 6 months.  If, there is a problem with the liver, we will have to figure out what to do about this.     [name], MD 7/8/201910:29 AM

## 2018-03-28 ENCOUNTER — Telehealth: Payer: Self-pay | Admitting: *Deleted

## 2018-03-28 LAB — ERYTHROPOIETIN: Erythropoietin: 35.5 m[IU]/mL — ABNORMAL HIGH (ref 2.6–18.5)

## 2018-03-28 LAB — FERRITIN: FERRITIN: 172 ng/mL (ref 24–336)

## 2018-03-28 LAB — IRON AND TIBC
Iron: 56 ug/dL (ref 42–163)
Saturation Ratios: 14 % — ABNORMAL LOW (ref 42–163)
TIBC: 396 ug/dL (ref 202–409)
UIBC: 341 ug/dL

## 2018-03-28 NOTE — Telephone Encounter (Addendum)
Patient is aware of results. He also knows to start OTC iron supplement. Suggested generic ferrous sulfate 315m. Confirmed with teach back.   ----- Message from PVolanda Napoleon MD sent at 03/28/2018  9:19 AM EDT ----- Call - the iron is low!!  Try some OTC iron supplements.  PLaurey Arrow

## 2018-04-03 ENCOUNTER — Other Ambulatory Visit: Payer: Self-pay | Admitting: *Deleted

## 2018-04-03 DIAGNOSIS — G4733 Obstructive sleep apnea (adult) (pediatric): Secondary | ICD-10-CM | POA: Diagnosis not present

## 2018-04-03 MED ORDER — PREDNISONE 50 MG PO TABS: ORAL_TABLET | ORAL | 3 refills | Status: DC

## 2018-04-03 MED ORDER — DIPHENHYDRAMINE HCL 50 MG PO TABS: ORAL_TABLET | ORAL | 0 refills | Status: DC

## 2018-04-04 ENCOUNTER — Encounter (HOSPITAL_BASED_OUTPATIENT_CLINIC_OR_DEPARTMENT_OTHER): Payer: Self-pay

## 2018-04-04 ENCOUNTER — Other Ambulatory Visit: Payer: Self-pay | Admitting: Cardiology

## 2018-04-04 ENCOUNTER — Ambulatory Visit (HOSPITAL_BASED_OUTPATIENT_CLINIC_OR_DEPARTMENT_OTHER)
Admission: RE | Admit: 2018-04-04 | Discharge: 2018-04-04 | Disposition: A | Discharge status: Home / Self Care | Payer: Medicare Other | Source: Ambulatory Visit | Attending: Hematology & Oncology | Admitting: Hematology & Oncology

## 2018-04-04 ENCOUNTER — Other Ambulatory Visit: Payer: Self-pay | Admitting: Gastroenterology

## 2018-04-04 DIAGNOSIS — I480 Paroxysmal atrial fibrillation: Secondary | ICD-10-CM

## 2018-04-04 DIAGNOSIS — K7460 Unspecified cirrhosis of liver: Secondary | ICD-10-CM | POA: Insufficient documentation

## 2018-04-04 DIAGNOSIS — K769 Liver disease, unspecified: Secondary | ICD-10-CM | POA: Diagnosis not present

## 2018-04-04 DIAGNOSIS — I10 Essential (primary) hypertension: Secondary | ICD-10-CM

## 2018-04-04 DIAGNOSIS — I700 Atherosclerosis of aorta: Secondary | ICD-10-CM | POA: Insufficient documentation

## 2018-04-04 DIAGNOSIS — I2510 Atherosclerotic heart disease of native coronary artery without angina pectoris: Secondary | ICD-10-CM

## 2018-04-04 DIAGNOSIS — I255 Ischemic cardiomyopathy: Secondary | ICD-10-CM

## 2018-04-04 DIAGNOSIS — E785 Hyperlipidemia, unspecified: Secondary | ICD-10-CM

## 2018-04-04 DIAGNOSIS — K8020 Calculus of gallbladder without cholecystitis without obstruction: Secondary | ICD-10-CM | POA: Diagnosis not present

## 2018-04-04 DIAGNOSIS — Z86010 Personal history of colonic polyps: Secondary | ICD-10-CM

## 2018-04-04 DIAGNOSIS — N200 Calculus of kidney: Secondary | ICD-10-CM | POA: Insufficient documentation

## 2018-04-04 DIAGNOSIS — R161 Splenomegaly, not elsewhere classified: Secondary | ICD-10-CM | POA: Insufficient documentation

## 2018-04-04 DIAGNOSIS — Z95810 Presence of automatic (implantable) cardiac defibrillator: Secondary | ICD-10-CM

## 2018-04-04 DIAGNOSIS — I2583 Coronary atherosclerosis due to lipid rich plaque: Secondary | ICD-10-CM

## 2018-04-04 MED ORDER — IOPAMIDOL (ISOVUE-300) INJECTION 61%
100.0000 mL | Freq: Once | INTRAVENOUS | Status: AC | PRN
  Administered 2018-04-04: 100 mL via INTRAVENOUS

## 2018-04-04 NOTE — Telephone Encounter (Signed)
Rx sent to pharmacy   

## 2018-04-05 ENCOUNTER — Other Ambulatory Visit: Payer: Self-pay | Admitting: Hematology & Oncology

## 2018-04-05 DIAGNOSIS — K7460 Unspecified cirrhosis of liver: Secondary | ICD-10-CM

## 2018-04-05 NOTE — Progress Notes (Signed)
I spoke to Mr. Sanden on the phone today.  I gave him the report regarding the CT scan that he had done of his liver.  The radiologist were not definitive about what was going on in the liver.  They do not know if he had small regenerative nodules or one or more hepatocellular carcinomas.  The recommended MRI.  Of course, they do not realize that he cannot have a MRI because of his pacemaker.  We will order another liver CT scan in 2 months.  He understood this.  He was thankful that I was able to call him.  Lattie Haw, MD

## 2018-04-13 LAB — CUP PACEART REMOTE DEVICE CHECK
Battery Remaining Longevity: 58 mo
Battery Remaining Percentage: 55 %
Date Time Interrogation Session: 20190605060015
HIGH POWER IMPEDANCE MEASURED VALUE: 78 Ohm
HighPow Impedance: 78 Ohm
Implantable Lead Implant Date: 20140320
Implantable Lead Implant Date: 20140320
Implantable Pulse Generator Implant Date: 20140320
Lead Channel Impedance Value: 400 Ohm
Lead Channel Sensing Intrinsic Amplitude: 2.4 mV
Lead Channel Setting Pacing Amplitude: 2 V
MDC IDC LEAD LOCATION: 753859
MDC IDC LEAD LOCATION: 753860
MDC IDC MSMT BATTERY VOLTAGE: 2.92 V
MDC IDC MSMT LEADCHNL RA IMPEDANCE VALUE: 360 Ohm
MDC IDC MSMT LEADCHNL RV PACING THRESHOLD AMPLITUDE: 0.75 V
MDC IDC MSMT LEADCHNL RV PACING THRESHOLD PULSEWIDTH: 0.5 ms
MDC IDC MSMT LEADCHNL RV SENSING INTR AMPL: 11.2 mV
MDC IDC PG SERIAL: 7067730
MDC IDC SET LEADCHNL RV PACING PULSEWIDTH: 0.5 ms
MDC IDC SET LEADCHNL RV SENSING SENSITIVITY: 0.5 mV
MDC IDC STAT BRADY RV PERCENT PACED: 1 %

## 2018-04-17 ENCOUNTER — Other Ambulatory Visit: Payer: Self-pay | Admitting: Internal Medicine

## 2018-04-17 ENCOUNTER — Encounter: Payer: Self-pay | Admitting: Internal Medicine

## 2018-04-17 MED ORDER — INSULIN GLARGINE 100 UNIT/ML SOLOSTAR PEN: 30.0000 [IU] | PEN_INJECTOR | Freq: Every day | SUBCUTANEOUS | 3 refills | Status: DC

## 2018-04-28 ENCOUNTER — Ambulatory Visit
Admission: RE | Admit: 2018-04-28 | Discharge: 2018-04-28 | Disposition: A | Discharge status: Home / Self Care | Payer: Medicare Other | Source: Ambulatory Visit | Attending: Gastroenterology | Admitting: Gastroenterology

## 2018-04-28 DIAGNOSIS — Z86010 Personal history of colonic polyps: Secondary | ICD-10-CM | POA: Diagnosis not present

## 2018-05-03 ENCOUNTER — Encounter: Payer: Self-pay | Admitting: Hematology & Oncology

## 2018-05-04 ENCOUNTER — Other Ambulatory Visit: Payer: Self-pay | Admitting: Family

## 2018-05-04 DIAGNOSIS — K769 Liver disease, unspecified: Secondary | ICD-10-CM

## 2018-05-11 DIAGNOSIS — L570 Actinic keratosis: Secondary | ICD-10-CM | POA: Diagnosis not present

## 2018-05-12 ENCOUNTER — Other Ambulatory Visit: Payer: Self-pay | Admitting: Family

## 2018-05-12 DIAGNOSIS — N183 Chronic kidney disease, stage 3 unspecified: Secondary | ICD-10-CM

## 2018-05-12 DIAGNOSIS — D631 Anemia in chronic kidney disease: Secondary | ICD-10-CM

## 2018-05-12 MED ORDER — DIPHENHYDRAMINE HCL 50 MG PO TABS: ORAL_TABLET | ORAL | 0 refills | Status: DC

## 2018-05-12 MED ORDER — PREDNISONE 50 MG PO TABS: ORAL_TABLET | ORAL | 3 refills | Status: DC

## 2018-05-15 ENCOUNTER — Encounter: Payer: Self-pay | Admitting: Internal Medicine

## 2018-05-15 ENCOUNTER — Ambulatory Visit (INDEPENDENT_AMBULATORY_CARE_PROVIDER_SITE_OTHER): Payer: Medicare Other | Admitting: Internal Medicine

## 2018-05-15 VITALS — BP 124/68 | HR 72 | Ht 73.0 in | Wt 338.2 lb

## 2018-05-15 DIAGNOSIS — Z794 Long term (current) use of insulin: Secondary | ICD-10-CM | POA: Diagnosis not present

## 2018-05-15 DIAGNOSIS — N183 Chronic kidney disease, stage 3 unspecified: Secondary | ICD-10-CM

## 2018-05-15 DIAGNOSIS — E7800 Pure hypercholesterolemia, unspecified: Secondary | ICD-10-CM

## 2018-05-15 DIAGNOSIS — E1122 Type 2 diabetes mellitus with diabetic chronic kidney disease: Secondary | ICD-10-CM

## 2018-05-15 DIAGNOSIS — I255 Ischemic cardiomyopathy: Secondary | ICD-10-CM | POA: Diagnosis not present

## 2018-05-15 LAB — POCT GLYCOSYLATED HEMOGLOBIN (HGB A1C): Hemoglobin A1C: 7.8 % — AB (ref 4.0–5.6)

## 2018-05-15 NOTE — Addendum Note (Signed)
Addended by: Drucilla Schmidt on: 05/15/2018 01:57 PM   Modules accepted: Orders

## 2018-05-15 NOTE — Progress Notes (Signed)
Patient ID: Robert Gregory, male   DOB: December 10, 1940, 77 y.o.   MRN: [id number]  HPI: Robert Gregory is a 77 y.o.-year-old male, returning for f/u for DM2, dx in 2004, insulin-dependent, uncontrolled, with complications (CAD - Ischemic CMP, s/p CABG x5 in 1997, CHF; CKD stage 3). Last visit 3 mo ago.  He had a CT scan 03/2018 (with steroids as he is allergic to iodine) >> sugars up to 513.  Last hemoglobin A1c was: Lab Results  Component Value Date   HGBA1C 8.0 (A) 02/17/2018   HGBA1C 8.1 11/10/2017   HGBA1C 7.4 08/05/2017   Pt is on a regimen of: - Metformin ER 2000 mg with dinner - U500 insulin: - 110 units before breakfast - 70 units before lunch  - 45-55 units before dinner - Lantus 30 units at bedtime - added 01/2018 He was on Cinnamon 500 mg 2x a day He gained 50 lbs with Actos in the past.  He was on Invokana 100 mg in am in the past. We stopped Bydureon at last visit 2/2 severe constipation >> now resolved  Pt checks his sugars 4x a day- per log - am: 175-241 >> 159-242, 282, 326 >> 110-229 - 2h after b'fast:  194 >> 216 >> 278 >> n/c >> 204 - before lunch: 207-362 >> 109-333, 372 >> 222-369 - 2h after lunch:  207-315 >> 233, 315 >> 277-369 - before dinner: 173-295 >> 122-315 >> 127-298 - 2h after dinner:  196-278 >> 155-255, 366 >> 88, 163-279, 354 - bedtime:   201, 233 >> 137-271 >> 115-192 - nighttime: 1116-162 >> 132 >> 67--158 Lowest sugar: 132 >> 122 >> 67; he has hypoglycemia awareness in the 70s Highest sugar: 362 >> 376 >> 513 x1 (steroids).  Glucometer: [employer] 2  Pt's meals are: - Breakfast: cereals + 2% milk, sausage + eggs + toast; still OJ - Lunch: soup + sandwich (lightest) - Dinner: meat + veggies + starch  - Snacks: 1- pm: peanuts, V8 juice  - + CKD, last BUN/creatinine:  Lab Results  Component Value Date   BUN 35 (H) 03/27/2018   CREATININE 1.20 03/27/2018  On Benicar 80.  Reviewed GFR levels: Lab Results  Component Value Date   GFRNONAA 38 (L) 09/26/2017   GFRNONAA 61 (L) 12/01/2014   GFRNONAA 43 (L) 11/30/2014   GFRNONAA 49 (L) 11/19/2014   GFRNONAA 56 (L) 09/18/2014   GFRNONAA 37 (L) 09/11/2014   GFRNONAA 46 (L) 09/03/2014   GFRNONAA 56 (L) 07/31/2014   GFRNONAA 60 11/14/2013   -+ HL; last set of lipids: Lab Results  Component Value Date   CHOL 123 08/03/2017   HDL 26 (L) 08/03/2017   LDLCALC 26 08/03/2017   TRIG 355 (H) 08/03/2017   CHOLHDL 4.7 08/03/2017  On Pravastatin 80, generic Lovaza. - last eye exam was in 12/2017: No DR - No numbness and tingling in his feet.  He has a history of a right diabetic foot ulcer developed after removal of a callus >> followed by Dr. Zigmund Daniel (Wound Care).  This is healed.  ROS: Constitutional: no weight gain/no weight loss, no fatigue, no subjective hyperthermia, no subjective hypothermia Eyes: no blurry vision, no xerophthalmia ENT: no sore throat, no nodules palpated in throat, no dysphagia, no odynophagia, no hoarseness Cardiovascular: no CP/no SOB/no palpitations/no leg swelling Respiratory: no cough/no SOB/no wheezing Gastrointestinal: no N/no V/no D/no C/no acid reflux Musculoskeletal: no muscle aches/no joint aches Skin: no rashes, no hair loss Neurological: no tremors/no  numbness/no tingling/no dizziness  I reviewed pt's medications, allergies, PMH, social hx, family hx, and changes were documented in the history of present illness. Otherwise, unchanged from my initial visit note.  Past Medical History:  Diagnosis Date  . Arthritis   . Atrial fibrillation ([place])    takes Xarelto daily  . CAD (coronary artery disease)   . Cardiomyopathy, ischemic 11/07/2013  . Cholelithiasis   . Chronic back pain    scoliosis/arthritis  . Complication of anesthesia    forgetful for about 2-3wks after anesthesia  . Congestive heart failure ([place])    takes Spironolactone daily  . Diabetes mellitus ([place])    takes Invokamet daily as well as Hum U  . History of colon  polyps   . History of kidney stones   . Hyperlipidemia    takes Sanmina-SCI daily  . Hyperlipidemia 11/07/2013  . Hypertension    takes Benicar and Coreg daily  . ICD (implantable cardioverter-defibrillator) battery depletion   . ICD (implantable cardioverter-defibrillator) in place 11/07/2013  . Ischemic cardiomyopathy   . Ischemic cardiomyopathy   . Joint pain   . Myocardial infarction ([place]) 1997   on the OR table   . Nephrolithiasis   . Obstructive sleep apnea   . Peripheral neuropathy   . Pneumonia    as a child  . Spinal stenosis   . Thrombocytopenia ([place])   . Total knee replacement status 11/29/2014  . Type 2 diabetes mellitus with stage 3 chronic kidney disease ([place]) 05/19/2015  . Urinary frequency   . Urinary urgency    Past Surgical History:  Procedure Laterality Date  . CARDIAC CATHETERIZATION N/A 10/02/2015   Procedure: Left Heart Cath and Cors/Grafts Angiography;  Surgeon: Belva Crome, MD;  Location: [facility];  Service: Cardiovascular;  Laterality: N/A;  . COLONOSCOPY    . coronary artery bypass and graft  1997   x 5  . ESOPHAGOGASTRODUODENOSCOPY    . ICD placed    . LAMINECTOMY    . LITHOTRIPSY    . Right rotator cuff surgery    . TONSILLECTOMY     adenoids  . TOTAL KNEE ARTHROPLASTY Left   . TOTAL KNEE ARTHROPLASTY Right 11/29/2014   Procedure: RIGHT TOTAL KNEE ARTHROPLASTY;  Surgeon: Leandrew Koyanagi, MD;  Location: [facility];  Service: Orthopedics;  Laterality: Right;   Social History   Social History  . Marital status: Married    Spouse name: N/A  . Number of children: N/A   Occupational History  .      Retired from [employer]  . Smoking status: Never Smoker  . Smokeless tobacco: Never Used  . Alcohol use 0.0 oz/week     Comment: once drink a month  . Drug use: No   Current Outpatient Medications on File Prior to Visit  Medication Sig Dispense Refill  . amoxicillin (AMOXIL) 500 MG capsule  Take 500 mg by mouth as directed. 6 prior to dental work    . BD INSULIN SYRINGE U-500 31G X 6MM 0.5 ML MISC USE 1 SYRINGE TO INJECT UNDER THE SKIN THREE TIMES A DAY BEFORE MEALS 300 each 3  . BENICAR 40 MG tablet TAKE 1 TABLET TWICE A DAY 180 tablet 3  . carvedilol (COREG) 12.5 MG tablet TAKE 1 TABLET TWICE A DAY WITH MEALS 180 tablet 3  . cholecalciferol (VITAMIN D) 1000 units tablet Take 1,000 Units by mouth daily.    . Coenzyme Q10  200 MG TABS Take 1 tablet by mouth daily.    . Cranberry 200 MG CAPS Take 200 mg by mouth daily.     . diphenhydrAMINE (BENADRYL) 50 MG tablet Take one hour prior to study 1 tablet 0  . ergocalciferol (VITAMIN D2) 50000 units capsule Take 50,000 Units by mouth once a week.    . ezetimibe (ZETIA) 10 MG tablet TAKE 1 TABLET DAILY 90 tablet 3  . furosemide (LASIX) 40 MG tablet TAKE 1 TABLET DAILY 90 tablet 3  . Insulin Glargine (LANTUS SOLOSTAR) 100 UNIT/ML Solostar Pen Inject 30 Units into the skin daily at 10 pm. 10 pen 3  . insulin regular human CONCENTRATED (HUMULIN R) 500 UNIT/ML injection Inject up to 200 units a day as advised 3 vial 3  . KLOR-CON 10 10 MEQ tablet TAKE 2 TABLETS (20 MEQ TOTAL) TWICE A DAY 360 tablet 3  . metFORMIN (GLUCOPHAGE-XR) 500 MG 24 hr tablet TAKE 4 TABLETS (2000 MG) WITH DINNER 360 tablet 0  . Multiple Vitamins-Minerals (CENTRUM SILVER PO) Take 1 tablet by mouth daily.    . nitroGLYCERIN (NITROSTAT) 0.4 MG SL tablet DISSOLVE  1 TABLET UNDER THE TONGUE EVERY 5 MINUTES AS NEEDED FOR CHEST PAIN 25 tablet 3  . NON FORMULARY Methyfolate 2000 MCG daily    . omega-3 acid ethyl esters (LOVAZA) 1 g capsule Take 2 capsules twice a day 360 capsule 1  . omega-3 acid ethyl esters (LOVAZA) 1 g capsule TAKE 2 CAPSULES TWICE A DAY 360 capsule 1  . pravastatin (PRAVACHOL) 80 MG tablet TAKE 1 TABLET DAILY 90 tablet 3  . predniSONE (DELTASONE) 50 MG tablet Take 13 hours, 7 hours and 1 hour prior to study. 3 tablet 3  . PROAIR HFA 108 (90 Base) MCG/ACT  inhaler Inhale 2 puffs into the lungs as needed.    . Probiotic Product (PROBIOTIC DAILY PO) Take 1 tablet by mouth daily.    Marland Kitchen spironolactone (ALDACTONE) 25 MG tablet TAKE 1 TABLET DAILY 90 tablet 3  . TRICOR 48 MG tablet TAKE 1 TABLET DAILY 90 tablet 2  . XARELTO 20 MG TABS tablet TAKE 1 TABLET DAILY WITH SUPPER 90 tablet 3   No current facility-administered medications on file prior to visit.    Allergies  Allergen Reactions  . Iodine     flushing  . Seasonal Ic [Cholestatin]   . Contrast Media [Iodinated Diagnostic Agents] Rash and Other (See Comments)    flushing EXTREME FLUSHING    Family History  Problem Relation Age of Onset  . Heart disease Unknown        No family history   PE: BP 124/68   Pulse 72   Ht 6' 1"  (1.854 m)   Wt (!) 338 lb 3.2 oz (153.4 kg)   SpO2 97%   BMI 44.62 kg/m  Body mass index is 44.62 kg/m. Wt Readings from Last 3 Encounters:  05/15/18 (!) 338 lb 3.2 oz (153.4 kg)  03/27/18 (!) 334 lb (151.5 kg)  02/17/18 (!) 332 lb 12.8 oz (151 kg)   Constitutional: overweight, in NAD Eyes: PERRLA, EOMI, no exophthalmos ENT: moist mucous membranes, no thyromegaly, no cervical lymphadenopathy Cardiovascular: RRR, No MRG, + B LE swelling Respiratory: CTA B Gastrointestinal: abdomen soft, NT, ND, BS+ Musculoskeletal: no deformities, strength intact in all 4 Skin: moist, warm, no rashes Neurological: + mild tremor with outstretched hands, DTR normal in all 4  ASSESSMENT: 1. DM2, insulin-dependent, uncontrolled, with complications - CAD, s/p CABG 5x in 1997 (  Dr Roxy Horseman), iCMP >> CHF (Dr Stanford Breed) - CKD stage 3   2. Obesity class 3  3. HL  PLAN:  1. Patient with long-standing, uncontrolled, type 2 diabetes, very insulin resistant, on U500 insulin regimen, metformin, and now long-acting insulin added at last visit.  He was previously on GLP-1 which we had to stop due to severe constipation and also SGLT2 inhibitor which we had to stop due to dehydration  and increased GFR.  At last visit, sugars were still high in the morning, blood sugars at that time were on the lower side, therefore, we reduced his dinnertime U500 and I advised him to start Toujeo (Lantus was covered instead).  His sugars improved after adding this. - At this visit, sugars are only slightly improved especially in the morning after adding long-acting insulin at night.  However, he still had 2 instances in which his sugars decrease in the 60s and 70s in the middle of the night and he felt these as lows.  We discussed at this visit about the 15-15 row to correct hypoglycemia and I gave him a handout with alternative for treatment. - Because of these nighttime mild lows, we will decrease his U500 insulin with dinner further, and since his sugars are high during the day, I advised him to increase U500 insulin before breakfast and lunch.  We will continue with the current dose of Lantus. - I suggested to:  Patient Instructions  Please continue: - Metformin ER 2000 mg with dinner - Lantus 30 units at bedtime  Please change - U500 insulin: - 110 >> 125 units before breakfast - 70 >> 80 units before lunch  - 40-55 >> 35-40 units before dinner  Please return in 3 months with your sugar log.  - today, HbA1c is 7.8% (slightly lower) - continue checking sugars at different times of the day - check 3x a day, rotating checks - advised for yearly eye exams >> he is UTD - Return to clinic in 3 mo with sugar log    2. Obesity class 3 - weight remains very high and continues to increase - refused GBP - Unfortunately, we could not continue GLP-1 receptor agonist due to severe constipation and we also had to stop Jardiance due to dehydration and lower GFR  3. HL - Reviewed latest lipid panel from 07/2017: LDL at goal, but triglycerides high and HDL low Lab Results  Component Value Date   CHOL 123 08/03/2017   HDL 26 (L) 08/03/2017   LDLCALC 26 08/03/2017   TRIG 355 (H) 08/03/2017    CHOLHDL 4.7 08/03/2017  - Continues high-dose pravastatin without side effects. Also on generic Lovaza.  Philemon Kingdom, MD PhD [facility] End[facility]

## 2018-05-15 NOTE — Patient Instructions (Addendum)
Please continue: - Metformin ER 2000 mg with dinner - Lantus 30 units at bedtime  Please change - U500 insulin: - 110 >> 125 units before breakfast - 70 >> 80 units before lunch  - 40-55 >> 35-40 units before dinner  Please return in 3 months with your sugar log.

## 2018-05-16 ENCOUNTER — Encounter: Payer: Self-pay | Admitting: Hematology & Oncology

## 2018-05-24 ENCOUNTER — Other Ambulatory Visit: Payer: Self-pay | Admitting: *Deleted

## 2018-05-24 ENCOUNTER — Ambulatory Visit (INDEPENDENT_AMBULATORY_CARE_PROVIDER_SITE_OTHER): Payer: Medicare Other | Admitting: *Deleted

## 2018-05-24 DIAGNOSIS — I5022 Chronic systolic (congestive) heart failure: Secondary | ICD-10-CM

## 2018-05-24 DIAGNOSIS — D696 Thrombocytopenia, unspecified: Secondary | ICD-10-CM

## 2018-05-24 DIAGNOSIS — I4821 Permanent atrial fibrillation: Secondary | ICD-10-CM

## 2018-05-24 NOTE — Progress Notes (Signed)
Remote ICD transmission.   

## 2018-05-25 ENCOUNTER — Inpatient Hospital Stay: Payer: Medicare Other | Attending: Hematology & Oncology

## 2018-05-25 ENCOUNTER — Ambulatory Visit (HOSPITAL_BASED_OUTPATIENT_CLINIC_OR_DEPARTMENT_OTHER): Admission: RE | Admit: 2018-05-25 | Payer: Medicare Other | Source: Ambulatory Visit

## 2018-05-25 DIAGNOSIS — D696 Thrombocytopenia, unspecified: Secondary | ICD-10-CM | POA: Insufficient documentation

## 2018-05-25 LAB — CMP (CANCER CENTER ONLY)
ALT: 44 U/L (ref 0–44)
AST: 41 U/L (ref 15–41)
Albumin: 3.7 g/dL (ref 3.5–5.0)
Alkaline Phosphatase: 66 U/L (ref 38–126)
Anion gap: 9 (ref 5–15)
BUN: 32 mg/dL — AB (ref 8–23)
CO2: 28 mmol/L (ref 22–32)
Calcium: 10.2 mg/dL (ref 8.9–10.3)
Chloride: 101 mmol/L (ref 98–111)
Creatinine: 1.66 mg/dL — ABNORMAL HIGH (ref 0.61–1.24)
GFR, EST NON AFRICAN AMERICAN: 38 mL/min — AB (ref >60)
GFR, Est AFR Am: 44 mL/min — ABNORMAL LOW (ref >60)
GLUCOSE: 283 mg/dL — AB (ref 70–99)
POTASSIUM: 4.7 mmol/L (ref 3.5–5.1)
Sodium: 138 mmol/L (ref 135–145)
Total Bilirubin: 0.7 mg/dL (ref 0.3–1.2)
Total Protein: 6.9 g/dL (ref 6.5–8.1)

## 2018-05-25 LAB — CBC WITH DIFFERENTIAL (CANCER CENTER ONLY)
BASOS ABS: 0 10*3/uL (ref 0.0–0.1)
BASOS PCT: 0 %
EOS ABS: 0.2 10*3/uL (ref 0.0–0.5)
EOS PCT: 3 %
HCT: 36.2 % — ABNORMAL LOW (ref 38.7–49.9)
Hemoglobin: 11.4 g/dL — ABNORMAL LOW (ref 13.0–17.1)
Lymphocytes Relative: 15 %
Lymphs Abs: 0.7 10*3/uL — ABNORMAL LOW (ref 0.9–3.3)
MCH: 29.6 pg (ref 28.0–33.4)
MCHC: 31.5 g/dL — ABNORMAL LOW (ref 32.0–35.9)
MCV: 94 fL (ref 82.0–98.0)
MONO ABS: 0.5 10*3/uL (ref 0.1–0.9)
Monocytes Relative: 12 %
Neutro Abs: 3.1 10*3/uL (ref 1.5–6.5)
Neutrophils Relative %: 70 %
Platelet Count: 72 10*3/uL — ABNORMAL LOW (ref 145–400)
RBC: 3.85 MIL/uL — AB (ref 4.20–5.70)
RDW: 16.1 % — AB (ref 11.1–15.7)
WBC: 4.5 10*3/uL (ref 4.0–10.0)

## 2018-05-26 ENCOUNTER — Encounter (HOSPITAL_BASED_OUTPATIENT_CLINIC_OR_DEPARTMENT_OTHER): Payer: Self-pay

## 2018-05-26 ENCOUNTER — Ambulatory Visit (HOSPITAL_BASED_OUTPATIENT_CLINIC_OR_DEPARTMENT_OTHER)
Admission: RE | Admit: 2018-05-26 | Discharge: 2018-05-26 | Disposition: A | Discharge status: Home / Self Care | Payer: Medicare Other | Source: Ambulatory Visit | Attending: Family | Admitting: Family

## 2018-05-26 ENCOUNTER — Telehealth: Payer: Self-pay | Admitting: *Deleted

## 2018-05-26 DIAGNOSIS — K769 Liver disease, unspecified: Secondary | ICD-10-CM

## 2018-05-26 DIAGNOSIS — K8020 Calculus of gallbladder without cholecystitis without obstruction: Secondary | ICD-10-CM | POA: Insufficient documentation

## 2018-05-26 DIAGNOSIS — K7460 Unspecified cirrhosis of liver: Secondary | ICD-10-CM | POA: Diagnosis not present

## 2018-05-26 DIAGNOSIS — K766 Portal hypertension: Secondary | ICD-10-CM | POA: Diagnosis not present

## 2018-05-26 DIAGNOSIS — R161 Splenomegaly, not elsewhere classified: Secondary | ICD-10-CM | POA: Diagnosis not present

## 2018-05-26 MED ORDER — IOPAMIDOL (ISOVUE-300) INJECTION 61%
80.0000 mL | Freq: Once | INTRAVENOUS | Status: AC | PRN
  Administered 2018-05-26: 100 mL via INTRAVENOUS

## 2018-05-26 NOTE — Telephone Encounter (Signed)
Pt returned call and notified per order of [name] that blood sugar is still way too high at 283 and that liver is ok.  Patient appreciative of call and states that blood sugar was not fasting and that he will keep an eye on it.

## 2018-05-26 NOTE — Telephone Encounter (Signed)
-----   Message from [name], MD sent at 05/25/2018  4:43 PM EDT ----- Call - blood sugar is still way too high!!  It is 283!!!  Your liver is ok!!  Robert Gregory

## 2018-05-27 ENCOUNTER — Other Ambulatory Visit: Payer: Self-pay | Admitting: Internal Medicine

## 2018-05-29 ENCOUNTER — Telehealth: Payer: Self-pay | Admitting: *Deleted

## 2018-05-29 NOTE — Telephone Encounter (Addendum)
Patient is aware of results  ----- Message from [name], MD sent at 05/26/2018  6:13 PM EDT ----- Call - the liver looks ok!!!  No tumor.  You have cirrhosis -- we knew about that already.  Spleen is stable in size!!  This is all good!!  Robert Gregory

## 2018-05-29 NOTE — Progress Notes (Signed)
HPI: FU coronary artery disease. Patient is status post coronary artery bypassing graft in 1997. Patient had a LIMA to the LAD, saphenous vein graft to the diagonal, saphenous vein graft to the circumflex and sequential saphenous vein graft to the PDA and posterior lateral. His procedure was performed at [facility]. He subsequently moved to [facility]. Echocardiogram in 2005 showed an ejection fraction of 38%, restrictive filling and trace tricuspid regurgitation. Patient had ICD placed previously. Also with h/o atrial fibrillation. Nuclear study 12/16 showed EF 50; prior inferior MI with mild peri-infarct ischemia and moderate ischemia in the anterolateral wall. Cardiac catheterization January 2017 showed severe three-vessel coronary disease. There was total occlusion of the saphenous vein graft to the obtuse marginal, total occlusion of the saphenous vein graft to diagonal and there was a 50-70% stenosis in the sequential vein graft to the PDA and left ventricular branch and the LIMA to the LAD was patent. Ejection fraction 35-45%. Medical therapy recommended. Abdominal ultrasound August 2017 showed no aneurysm.  Recent abdominal CT shows cirrhosis, portal hypertension and splenomegaly.  Since last seen,patient has dyspnea with more extreme activities but not routine activities.  No orthopnea, PND, chest pain or syncope.  Chronic mild pedal edema.  Current Outpatient Medications  Medication Sig Dispense Refill  . amoxicillin (AMOXIL) 500 MG capsule Take 500 mg by mouth as directed. 6 prior to dental work    . BD INSULIN SYRINGE U-500 31G X 6MM 0.5 ML MISC USE 1 SYRINGE TO INJECT UNDER THE SKIN THREE TIMES A DAY BEFORE MEALS 300 each 3  . BENICAR 40 MG tablet TAKE 1 TABLET TWICE A DAY 180 tablet 3  . carvedilol (COREG) 12.5 MG tablet TAKE 1 TABLET TWICE A DAY WITH MEALS 180 tablet 3  . cholecalciferol (VITAMIN D) 1000 units tablet Take 1,000 Units by mouth daily.    . Coenzyme Q10 200 MG  TABS Take 1 tablet by mouth daily.    . Cranberry 200 MG CAPS Take 200 mg by mouth daily.     . diphenhydrAMINE (BENADRYL) 50 MG tablet Take one hour prior to study 1 tablet 0  . ergocalciferol (VITAMIN D2) 50000 units capsule Take 50,000 Units by mouth once a week.    . ezetimibe (ZETIA) 10 MG tablet TAKE 1 TABLET DAILY 90 tablet 3  . furosemide (LASIX) 40 MG tablet TAKE 1 TABLET DAILY 90 tablet 3  . Insulin Glargine (LANTUS SOLOSTAR) 100 UNIT/ML Solostar Pen Inject 30 Units into the skin daily at 10 pm. 10 pen 3  . insulin regular human CONCENTRATED (HUMULIN R) 500 UNIT/ML injection Inject up to 200 units a day as advised 3 vial 3  . KLOR-CON 10 10 MEQ tablet TAKE 2 TABLETS (20 MEQ TOTAL) TWICE A DAY 360 tablet 3  . metFORMIN (GLUCOPHAGE-XR) 500 MG 24 hr tablet TAKE 4 TABLETS (2000 MG) WITH DINNER 360 tablet 4  . Multiple Vitamins-Minerals (CENTRUM SILVER PO) Take 1 tablet by mouth daily.    . nitroGLYCERIN (NITROSTAT) 0.4 MG SL tablet DISSOLVE  1 TABLET UNDER THE TONGUE EVERY 5 MINUTES AS NEEDED FOR CHEST PAIN 25 tablet 3  . NON FORMULARY Methyfolate 2000 MCG daily    . omega-3 acid ethyl esters (LOVAZA) 1 g capsule TAKE 2 CAPSULES TWICE A DAY 360 capsule 1  . pravastatin (PRAVACHOL) 80 MG tablet TAKE 1 TABLET DAILY 90 tablet 3  . predniSONE (DELTASONE) 50 MG tablet Take 13 hours, 7 hours and 1 hour prior to  study. 3 tablet 3  . Probiotic Product (PROBIOTIC DAILY PO) Take 1 tablet by mouth daily.    Marland Kitchen spironolactone (ALDACTONE) 25 MG tablet TAKE 1 TABLET DAILY 90 tablet 3  . TRICOR 48 MG tablet TAKE 1 TABLET DAILY 90 tablet 2  . XARELTO 20 MG TABS tablet TAKE 1 TABLET DAILY WITH SUPPER 90 tablet 3   No current facility-administered medications for this visit.      Past Medical History:  Diagnosis Date  . Arthritis   . Atrial fibrillation ([place])    takes Xarelto daily  . CAD (coronary artery disease)   . Cardiomyopathy, ischemic 11/07/2013  . Cholelithiasis   . Chronic back pain     scoliosis/arthritis  . Complication of anesthesia    forgetful for about 2-3wks after anesthesia  . Congestive heart failure ([place])    takes Spironolactone daily  . Diabetes mellitus ([place])    takes Invokamet daily as well as Hum U  . History of colon polyps   . History of kidney stones   . Hyperlipidemia    takes Sanmina-SCI daily  . Hyperlipidemia 11/07/2013  . Hypertension    takes Benicar and Coreg daily  . ICD (implantable cardioverter-defibrillator) battery depletion   . ICD (implantable cardioverter-defibrillator) in place 11/07/2013  . Ischemic cardiomyopathy   . Ischemic cardiomyopathy   . Joint pain   . Myocardial infarction ([place]) 1997   on the OR table   . Nephrolithiasis   . Obstructive sleep apnea   . Peripheral neuropathy   . Pneumonia    as a child  . Spinal stenosis   . Thrombocytopenia ([place])   . Total knee replacement status 11/29/2014  . Type 2 diabetes mellitus with stage 3 chronic kidney disease ([place]) 05/19/2015  . Urinary frequency   . Urinary urgency     Past Surgical History:  Procedure Laterality Date  . CARDIAC CATHETERIZATION N/A 10/02/2015   Procedure: Left Heart Cath and Cors/Grafts Angiography;  Surgeon: Belva Crome, MD;  Location: [place] [facility];  Service: Cardiovascular;  Laterality: N/A;  . COLONOSCOPY    . coronary artery bypass and graft  1997   x 5  . ESOPHAGOGASTRODUODENOSCOPY    . ICD placed    . LAMINECTOMY    . LITHOTRIPSY    . Right rotator cuff surgery    . TONSILLECTOMY     adenoids  . TOTAL KNEE ARTHROPLASTY Left   . TOTAL KNEE ARTHROPLASTY Right 11/29/2014   Procedure: RIGHT TOTAL KNEE ARTHROPLASTY;  Surgeon: Leandrew Koyanagi, MD;  Location: [place];  Service: Orthopedics;  Laterality: Right;    Social History   Socioeconomic History  . Marital status: Married    Spouse name: Not on file  . Number of children: Not on file  . Years of education: Not on file  . Highest education level: Not on file    Occupational History    Comment: Retired from [employer]  . Financial resource strain: Not on file  . Food insecurity:    Worry: Not on file    Inability: Not on file  . Transportation needs:    Medical: Not on file    Non-medical: Not on file  Tobacco Use  . Smoking status: Never Smoker  . Smokeless tobacco: Never Used  Substance and Sexual Activity  . Alcohol use: Yes    Alcohol/week: 0.0 standard drinks    Comment: once drink a month  . Drug use: No  .  Sexual activity: Not on file  Lifestyle  . Physical activity:    Days per week: Not on file    Minutes per session: Not on file  . Stress: Not on file  Relationships  . Social connections:    Talks on phone: Not on file    Gets together: Not on file    Attends religious service: Not on file    Active member of club or organization: Not on file    Attends meetings of clubs or organizations: Not on file    Relationship status: Not on file  . Intimate partner violence:    Fear of current or ex partner: Not on file    Emotionally abused: Not on file    Physically abused: Not on file    Forced sexual activity: Not on file  Other Topics Concern  . Not on file  Social History Narrative  . Not on file    Family History  Problem Relation Age of Onset  . Heart disease Unknown        No family history    ROS: Fatigue but no fevers or chills, productive cough, hemoptysis, dysphasia, odynophagia, melena, hematochezia, dysuria, hematuria, rash, seizure activity, orthopnea, PND,  claudication. Remaining systems are negative.  Physical Exam: Well-developed morbidly obese in no acute distress.  Skin is warm and dry.  HEENT is normal.  Neck is supple.  Chest is clear to auscultation with normal expansion.  Cardiovascular exam is irregular Abdominal exam nontender or distended. No masses palpated. Extremities show trace edema. neuro grossly intact  ECG-sinus rhythm with Mobitz 1.  Right bundle branch  block.  Cannot rule out inferior infarct.  Personally reviewed  A/P  1 paroxysmal atrial fibrillation-patient appears to be in sinus rhythm today.  He is having Mobitz 1 physiology.  Decrease carvedilol to 6.25 mg twice daily.  Continue Xarelto.  2 coronary artery disease-no chest pain.  Plan to continue medical therapy with statin.  No aspirin given need for anticoagulation.  3 hypertension-blood pressure is controlled.  Plan to decrease carvedilol secondary to Mobitz 1.  Follow blood pressure and adjust regimen as needed.  4 hyperlipidemia-continue statin.  5 ischemic cardiomyopathy-continue ARB and beta-blocker.  6 chronic combined systolic/diastolic congestive heart failure-plan to continue present dose of diuretic.   7 prior ICD-followed by electrophysiology.  Kirk Ruths, MD

## 2018-05-31 ENCOUNTER — Encounter: Payer: Self-pay | Admitting: Cardiology

## 2018-05-31 ENCOUNTER — Ambulatory Visit (INDEPENDENT_AMBULATORY_CARE_PROVIDER_SITE_OTHER): Payer: Medicare Other | Admitting: Cardiology

## 2018-05-31 VITALS — BP 138/66 | HR 68 | Ht 73.0 in | Wt 339.1 lb

## 2018-05-31 DIAGNOSIS — D696 Thrombocytopenia, unspecified: Secondary | ICD-10-CM | POA: Diagnosis not present

## 2018-05-31 DIAGNOSIS — I2510 Atherosclerotic heart disease of native coronary artery without angina pectoris: Secondary | ICD-10-CM

## 2018-05-31 DIAGNOSIS — K7460 Unspecified cirrhosis of liver: Secondary | ICD-10-CM | POA: Diagnosis not present

## 2018-05-31 DIAGNOSIS — I5022 Chronic systolic (congestive) heart failure: Secondary | ICD-10-CM

## 2018-05-31 DIAGNOSIS — I4821 Permanent atrial fibrillation: Secondary | ICD-10-CM

## 2018-05-31 DIAGNOSIS — I255 Ischemic cardiomyopathy: Secondary | ICD-10-CM

## 2018-05-31 DIAGNOSIS — Z86010 Personal history of colonic polyps: Secondary | ICD-10-CM | POA: Diagnosis not present

## 2018-05-31 DIAGNOSIS — Z7901 Long term (current) use of anticoagulants: Secondary | ICD-10-CM | POA: Diagnosis not present

## 2018-05-31 DIAGNOSIS — I482 Chronic atrial fibrillation: Secondary | ICD-10-CM

## 2018-05-31 MED ORDER — CARVEDILOL 6.25 MG PO TABS: 6.2500 mg | ORAL_TABLET | Freq: Two times a day (BID) | ORAL | 3 refills | Status: DC

## 2018-05-31 NOTE — Patient Instructions (Signed)
Medication Instructions: Your Physician recommend you make the following changes to your medication. Decrease: Carvedilol 6.25 mg twice a day    If you need a refill on your cardiac medications before your next appointment, please call your pharmacy.   Labwork: None  Procedures/Testing: None  Follow-Up: Your physician wants you to follow-up in 6 months with Dr. Stanford Breed. You will receive a reminder letter in the mail two months in advance. If you don't receive a letter, please call our office at [phone] to schedule this follow-up appointment.   Special Instructions:    Thank you for choosing [facility] at [facility]!!

## 2018-06-01 ENCOUNTER — Telehealth: Payer: Self-pay | Admitting: Cardiology

## 2018-06-01 NOTE — Telephone Encounter (Signed)
   [facility] Pre-operative Risk Assessment    Request for surgical clearance:  1. What type of surgery is being performed? Endoscopy for h/o polyps    2. When is this surgery scheduled? 06/22/2018   3. What type of clearance is required (medical clearance vs. Pharmacy clearance to hold med vs. Both)? Pharmacy   4. Are there any medications that need to be held prior to surgery and how long? xarelto    5. Practice name and name of physician performing surgery? Dr. Paulita Fujita @ [facility], Endoscopy   6. What is your office phone number [phone]    7.   What is your office fax number [phone]  8.   Anesthesia type (None, local, MAC, general) ? Not specified    Robert Gregory 06/01/2018, 8:50 AM  _________________________________________________________________   (provider comments below)

## 2018-06-02 NOTE — Telephone Encounter (Signed)
Patient with diagnosis of Afib on Xarelto for anticoagulation.    Procedure: endoscopy Date of procedure: 06/22/18  CHADS2-VASc score of  6 (CHF, HTN, AGE, DM2, stroke/tia x 2, CAD, AGE, male)  CrCl 57m/min  Per office protocol, patient can hold Xarelto for 24 hours prior to procedure.

## 2018-06-13 ENCOUNTER — Ambulatory Visit: Payer: Medicare Other | Admitting: Internal Medicine

## 2018-06-19 DIAGNOSIS — L853 Xerosis cutis: Secondary | ICD-10-CM | POA: Diagnosis not present

## 2018-06-19 DIAGNOSIS — E1142 Type 2 diabetes mellitus with diabetic polyneuropathy: Secondary | ICD-10-CM | POA: Diagnosis not present

## 2018-06-19 DIAGNOSIS — M7741 Metatarsalgia, right foot: Secondary | ICD-10-CM | POA: Diagnosis not present

## 2018-06-19 DIAGNOSIS — M21622 Bunionette of left foot: Secondary | ICD-10-CM | POA: Diagnosis not present

## 2018-06-19 DIAGNOSIS — L84 Corns and callosities: Secondary | ICD-10-CM | POA: Diagnosis not present

## 2018-06-20 LAB — CUP PACEART REMOTE DEVICE CHECK
Battery Remaining Longevity: 55 mo
Battery Remaining Percentage: 52 %
HIGH POWER IMPEDANCE MEASURED VALUE: 78 Ohm
HighPow Impedance: 78 Ohm
Implantable Lead Implant Date: 20140320
Implantable Lead Location: 753860
Lead Channel Impedance Value: 360 Ohm
Lead Channel Impedance Value: 390 Ohm
MDC IDC LEAD IMPLANT DT: 20140320
MDC IDC LEAD LOCATION: 753859
MDC IDC MSMT BATTERY VOLTAGE: 2.92 V
MDC IDC MSMT LEADCHNL RA SENSING INTR AMPL: 2.4 mV
MDC IDC MSMT LEADCHNL RV PACING THRESHOLD AMPLITUDE: 0.75 V
MDC IDC MSMT LEADCHNL RV PACING THRESHOLD PULSEWIDTH: 0.5 ms
MDC IDC MSMT LEADCHNL RV SENSING INTR AMPL: 10.6 mV
MDC IDC PG IMPLANT DT: 20140320
MDC IDC PG SERIAL: 7067730
MDC IDC SESS DTM: 20190904060017
MDC IDC SET LEADCHNL RV PACING AMPLITUDE: 2 V
MDC IDC SET LEADCHNL RV PACING PULSEWIDTH: 0.5 ms
MDC IDC SET LEADCHNL RV SENSING SENSITIVITY: 0.5 mV
MDC IDC STAT BRADY RV PERCENT PACED: 1 %

## 2018-06-22 DIAGNOSIS — K3189 Other diseases of stomach and duodenum: Secondary | ICD-10-CM | POA: Diagnosis not present

## 2018-06-22 DIAGNOSIS — K766 Portal hypertension: Secondary | ICD-10-CM | POA: Diagnosis not present

## 2018-06-25 ENCOUNTER — Other Ambulatory Visit: Payer: Self-pay | Admitting: Cardiology

## 2018-06-27 DIAGNOSIS — L820 Inflamed seborrheic keratosis: Secondary | ICD-10-CM | POA: Diagnosis not present

## 2018-06-29 ENCOUNTER — Other Ambulatory Visit: Payer: Self-pay | Admitting: *Deleted

## 2018-06-29 ENCOUNTER — Ambulatory Visit (INDEPENDENT_AMBULATORY_CARE_PROVIDER_SITE_OTHER): Payer: Medicare Other | Admitting: Internal Medicine

## 2018-06-29 ENCOUNTER — Encounter: Payer: Self-pay | Admitting: Internal Medicine

## 2018-06-29 VITALS — BP 128/90 | HR 80 | Ht 73.0 in | Wt 332.0 lb

## 2018-06-29 DIAGNOSIS — I255 Ischemic cardiomyopathy: Secondary | ICD-10-CM | POA: Diagnosis not present

## 2018-06-29 DIAGNOSIS — E7800 Pure hypercholesterolemia, unspecified: Secondary | ICD-10-CM

## 2018-06-29 DIAGNOSIS — N183 Chronic kidney disease, stage 3 (moderate): Secondary | ICD-10-CM | POA: Diagnosis not present

## 2018-06-29 DIAGNOSIS — Z794 Long term (current) use of insulin: Secondary | ICD-10-CM | POA: Diagnosis not present

## 2018-06-29 DIAGNOSIS — E1122 Type 2 diabetes mellitus with diabetic chronic kidney disease: Secondary | ICD-10-CM

## 2018-06-29 MED ORDER — INSULIN REGULAR HUMAN (CONC) 500 UNIT/ML ~~LOC~~ SOLN: SUBCUTANEOUS | 3 refills | Status: AC

## 2018-06-29 MED ORDER — NITROGLYCERIN 0.4 MG SL SUBL: 0.4000 mg | SUBLINGUAL_TABLET | SUBLINGUAL | 3 refills | Status: DC | PRN

## 2018-06-29 NOTE — Progress Notes (Signed)
Patient ID: Robert Gregory, male   DOB: [birth year], 77 y.o.   MRN: [id number]  HPI: Robert Gregory is a 77 y.o.-year-old male, returning for f/u for DM2, dx in 2004, insulin-dependent, uncontrolled, with complications (CAD - Ischemic CMP, s/p CABG x5 in 1997, CHF; CKD stage 3). Last visit 1.5 months ago.  At this visit, he complains of persisting low blood sugars at night.  These are between 60s and 90s.  He stopped Lantus 1.5 months ago in an effort to reduce the frequency of the lows, but this did not help.  Upon questioning, he did not make the changes in his insulin doses as I suggested at last visit.  Last hemoglobin A1c was: Lab Results  Component Value Date   HGBA1C 7.8 (A) 05/15/2018   HGBA1C 8.0 (A) 02/17/2018   HGBA1C 8.1 11/10/2017   Pt is on a regimen of: - Metformin ER 2000 mg with dinner - Lantus 30 units at bedtime -at 01/2018 >> stopped 05/14/2018 - U500 insulin: - 110  units before breakfast - 70  75 units before lunch  - 40-55 >>50 units before dinner He was on Cinnamon 500 mg 2x a day He gained 50 lbs with Actos in the past.  He was on Invokana 100 mg in am in the past. We stopped Bydureon at last visit 2/2 severe constipation >> now resolved  Pt checks his sugars 4-8 times a day per his excellent log: - am:159-242, 282, 326 >> 110-229 >> 130, 162-204, 230, 375 - 2h after b'fast: 278 >> n/c >> 204 >> 131-219, 396 - before lunch: 109-333, 372 >> 222-369 >> 189-388 - 2h after lunch: 233, 315 >> 277-369 >> n/c - before dinner: 122-315 >> 127-298 >> 83-364 - 2h after dinner:  155-255, 366 >> 88, 163-279, 354 >> 96-242 - bedtime: 137-271 >> 115-192 >> 70-287 - nighttime:  132 >> 67--158 Lowest sugar: 67 >> 70; he has hypoglycemia awareness in the 70s Highest sugar: 513 x1 (steroids) >> 425.  Glucometer: [employer] 2  Pt's meals are: - Breakfast: cereals + 2% milk, sausage + eggs + toast; still OJ - Lunch: soup + sandwich (lightest) - Dinner: meat + veggies +  starch  - Snacks: 1- pm: peanuts, V8 juice  -+ CKD, last BUN/creatinine:  Lab Results  Component Value Date   BUN 32 (H) 05/25/2018   CREATININE 1.66 (H) 05/25/2018  On Benicar 80.  Reviewed GFR levels: Lab Results  Component Value Date   GFRNONAA 38 (L) 05/25/2018   GFRNONAA 38 (L) 09/26/2017   GFRNONAA 61 (L) 12/01/2014   GFRNONAA 43 (L) 11/30/2014   GFRNONAA 49 (L) 11/19/2014   GFRNONAA 56 (L) 09/18/2014   GFRNONAA 37 (L) 09/11/2014   GFRNONAA 46 (L) 09/03/2014   GFRNONAA 56 (L) 07/31/2014   GFRNONAA 60 11/14/2013   -+ HL; last set of lipids: Lab Results  Component Value Date   CHOL 123 08/03/2017   HDL 26 (L) 08/03/2017   LDLCALC 26 08/03/2017   TRIG 355 (H) 08/03/2017   CHOLHDL 4.7 08/03/2017  On pravastatin 80, generic Lovaza. - last eye exam was in 12/2017: No DR -No numbness and tingling in his feet.  He has a history of a right diabetic foot ulcer developed after removal of a callus >> followed by Dr. Zigmund Daniel (Wound Care).  This has healed.  ROS: Constitutional: no weight gain/no weight loss, no fatigue, no subjective hyperthermia, no subjective hypothermia Eyes: no blurry vision, no xerophthalmia ENT: no  sore throat, no nodules palpated in throat, no dysphagia, no odynophagia, no hoarseness Cardiovascular: no CP/no SOB/no palpitations/no leg swelling Respiratory: + cough/no SOB/no wheezing Gastrointestinal: no N/no V/no D/no C/no acid reflux Musculoskeletal: no muscle aches/no joint aches Skin: + Rash on forearms due to the sun, no hair loss Neurological: no tremors/no numbness/no tingling/no dizziness  I reviewed pt's medications, allergies, PMH, social hx, family hx, and changes were documented in the history of present illness. Otherwise, unchanged from my initial visit note.  Past Medical History:  Diagnosis Date  . Arthritis   . Atrial fibrillation ([place])    takes Xarelto daily  . CAD (coronary artery disease)   . Cardiomyopathy, ischemic 11/07/2013   . Cholelithiasis   . Chronic back pain    scoliosis/arthritis  . Complication of anesthesia    forgetful for about 2-3wks after anesthesia  . Congestive heart failure ([place])    takes Spironolactone daily  . Diabetes mellitus ([place])    takes Invokamet daily as well as Hum U  . History of colon polyps   . History of kidney stones   . Hyperlipidemia    takes [employer] daily  . Hyperlipidemia 11/07/2013  . Hypertension    takes Benicar and Coreg daily  . ICD (implantable cardioverter-defibrillator) battery depletion   . ICD (implantable cardioverter-defibrillator) in place 11/07/2013  . Ischemic cardiomyopathy   . Ischemic cardiomyopathy   . Joint pain   . Myocardial infarction ([place]) 1997   on the OR table   . Nephrolithiasis   . Obstructive sleep apnea   . Peripheral neuropathy   . Pneumonia    as a child  . Spinal stenosis   . Thrombocytopenia ([place])   . Total knee replacement status 11/29/2014  . Type 2 diabetes mellitus with stage 3 chronic kidney disease ([place]) 05/19/2015  . Urinary frequency   . Urinary urgency    Past Surgical History:  Procedure Laterality Date  . CARDIAC CATHETERIZATION N/A 10/02/2015   Procedure: Left Heart Cath and Cors/Grafts Angiography;  Surgeon: Belva Crome, MD;  Location: [facility];  Service: Cardiovascular;  Laterality: N/A;  . COLONOSCOPY    . coronary artery bypass and graft  1997   x 5  . ESOPHAGOGASTRODUODENOSCOPY    . ICD placed    . LAMINECTOMY    . LITHOTRIPSY    . Right rotator cuff surgery    . TONSILLECTOMY     adenoids  . TOTAL KNEE ARTHROPLASTY Left   . TOTAL KNEE ARTHROPLASTY Right 11/29/2014   Procedure: RIGHT TOTAL KNEE ARTHROPLASTY;  Surgeon: Leandrew Koyanagi, MD;  Location: [facility];  Service: Orthopedics;  Laterality: Right;   Social History   Social History  . Marital status: Married    Spouse name: N/A  . Number of children: N/A   Occupational History  .      Retired from [employer]  . Smoking status: Never Smoker  . Smokeless tobacco: Never Used  . Alcohol use 0.0 oz/week     Comment: once drink a month  . Drug use: No   Current Outpatient Medications on File Prior to Visit  Medication Sig Dispense Refill  . amoxicillin (AMOXIL) 500 MG capsule Take 500 mg by mouth as directed. 6 prior to dental work    . BD INSULIN SYRINGE U-500 31G X 6MM 0.5 ML MISC USE 1 SYRINGE TO INJECT UNDER THE SKIN THREE TIMES A DAY BEFORE MEALS 300 each  3  . BENICAR 40 MG tablet TAKE 1 TABLET TWICE A DAY 180 tablet 3  . carvedilol (COREG) 6.25 MG tablet Take 1 tablet (6.25 mg total) by mouth 2 (two) times daily with a meal. 90 tablet 3  . cholecalciferol (VITAMIN D) 1000 units tablet Take 1,000 Units by mouth daily.    . Coenzyme Q10 200 MG TABS Take 1 tablet by mouth daily.    . Cranberry 200 MG CAPS Take 200 mg by mouth daily.     Marland Kitchen ezetimibe (ZETIA) 10 MG tablet TAKE 1 TABLET DAILY 90 tablet 3  . furosemide (LASIX) 40 MG tablet TAKE 1 TABLET DAILY 90 tablet 3  . Insulin Glargine (LANTUS SOLOSTAR) 100 UNIT/ML Solostar Pen Inject 30 Units into the skin daily at 10 pm. 10 pen 3  . insulin regular human CONCENTRATED (HUMULIN R) 500 UNIT/ML injection Inject up to 200 units a day as advised 3 vial 3  . KLOR-CON 10 10 MEQ tablet TAKE 2 TABLETS (20 MEQ TOTAL) TWICE A DAY 360 tablet 3  . metFORMIN (GLUCOPHAGE-XR) 500 MG 24 hr tablet TAKE 4 TABLETS (2000 MG) WITH DINNER 360 tablet 4  . Multiple Vitamins-Minerals (CENTRUM SILVER PO) Take 1 tablet by mouth daily.    . NON FORMULARY Methyfolate 2000 MCG daily    . omega-3 acid ethyl esters (LOVAZA) 1 g capsule TAKE 2 CAPSULES TWICE A DAY 360 capsule 1  . pravastatin (PRAVACHOL) 80 MG tablet TAKE 1 TABLET DAILY 90 tablet 3  . Probiotic Product (PROBIOTIC DAILY PO) Take 1 tablet by mouth daily.    Marland Kitchen spironolactone (ALDACTONE) 25 MG tablet TAKE 1 TABLET DAILY 90 tablet 3  . TRICOR 48 MG tablet TAKE 1 TABLET DAILY 90 tablet  2  . XARELTO 20 MG TABS tablet TAKE 1 TABLET DAILY WITH SUPPER 90 tablet 3   No current facility-administered medications on file prior to visit.    Allergies  Allergen Reactions  . Iodine     flushing  . Seasonal Ic [Cholestatin]   . Contrast Media [Iodinated Diagnostic Agents] Rash and Other (See Comments)    flushing EXTREME FLUSHING    Family History  Problem Relation Age of Onset  . Heart disease Unknown        No family history   PE: BP 128/90   Pulse 80   Ht 6' 1"  (1.854 m)   Wt (!) 332 lb (150.6 kg)   SpO2 96%   BMI 43.80 kg/m  There is no height or weight on file to calculate BMI. Wt Readings from Last 3 Encounters:  06/29/18 (!) 332 lb (150.6 kg)  05/31/18 (!) 339 lb 1.9 oz (153.8 kg)  05/15/18 (!) 338 lb 3.2 oz (153.4 kg)   Constitutional: Obese, in NAD Eyes: PERRLA, EOMI, no exophthalmos ENT: moist mucous membranes, no thyromegaly, no cervical lymphadenopathy Cardiovascular: RRR, No MRG, + B lower extremity edema Respiratory: CTA B Gastrointestinal: abdomen soft, NT, ND, BS+ Musculoskeletal: no deformities, strength intact in all 4 Skin: moist, warm, no rashes Neurological: + Mild tremor with outstretched hands, DTR normal in all 4  ASSESSMENT: 1. DM2, insulin-dependent, uncontrolled, with complications - CAD, s/p CABG 5x in 1997 (Dr Roxy Horseman), iCMP >> CHF (Dr Stanford Breed) - CKD stage 3   2. Obesity class 3  3. HL  PLAN:  1. Patient with long-standing, uncontrolled, type 2 diabetes, very insulin resistant, on concentrated insulin, metformin, and also long-acting insulin added several months ago.  He was previously on a GLP-1 receptor  agonist which we had to stop due to severe constipation and also on an SGLT2 inhibitor which we had to stop due to dehydration and increased GFR.   - At last visit, sugars were slightly improved, especially in the morning after adding long-acting insulin at night.  However, he still had occasional low blood sugars in the 60s  and 70s in the middle of the night and he felt these as lows.  I advised him to decrease the U500 insulin dose before dinner and increase the doses with breakfast and lunch.  We also discussed about the 15-15 rule to correct hypoglycemia.  We did not change the dose of his Lantus.  At that time, HbA1c was slightly better, at 7.8%. -At this visit, upon questioning, he did not change the doses of U500 insulin in the morning and at dinnertime, and he only increased to 75 units before lunch.  Also, he stopped Lantus completely.  As a consequence, his sugars are very high before lunch, lower after dinner, mildly low in the middle of the night, and high in the morning. -To avoid further lows at night, we will back off his dinnertime U500 insulin to 50%.  In the meantime, add back Lantus.  To avoid high sugars in the middle of the day will increase his U500 in the morning.  Since we are backing off his U500 insulin with dinner we will increase the U500 insulin with lunch so that it can peak at dinnertime and help with post dinner sugars. -He is usually having peanut only snack in the middle of the morning and I advised him to stop this even though he does not have carbs, because of his calorie density and the fact that even protein + fat can increase sugars in a very insulin resistant patient. - I advised him to:  Patient Instructions  Please continue: - Metformin ER 2000 mg with dinner  Please add back: - Lantus 30 units at bedtime  Please change: - U500 insulin: - 110 >> 125 units before breakfast - 75 >> 90 units before lunch  - 50 >> 25 units before dinner  Please return in 3 months with your sugar log.  - continue checking sugars at different times of the day - check 3-4x a day, rotating checks - advised for yearly eye exams >> he is UTD - Return to clinic in 3 mo with sugar log     2. Obesity class 3 -Refused gastric bypass surgery -Weight remains very high, but lower by 7 pounds compared to  last time -Unfortunately, we could not continue GLP-1 receptor agonist due to severe constipation and we also had to stop Jardiance due to dehydration and lower GFR. -He absolutely needs to find a diet that works for him and start applying it.  For now, we discussed about reducing a.m. snack.  3. HL - Reviewed latest lipid panel from 07/2017: LDL at goal, but triglycerides high and HDL low Lab Results  Component Value Date   CHOL 123 08/03/2017   HDL 26 (L) 08/03/2017   LDLCALC 26 08/03/2017   TRIG 355 (H) 08/03/2017   CHOLHDL 4.7 08/03/2017  - Continues high-dose pravastatin without side effects.  Also on generic Lovaza.   Philemon Kingdom, MD PhD [facility]

## 2018-06-29 NOTE — Patient Instructions (Signed)
Please continue: - Metformin ER 2000 mg with dinner  Please add back: - Lantus 30 units at bedtime  Please change: - U500 insulin: - 110 >> 125 units before breakfast - 75 >> 90 units before lunch  - 50 >> 25 units before dinner  Please return in 3 months with your sugar log.

## 2018-07-03 ENCOUNTER — Other Ambulatory Visit: Payer: Self-pay

## 2018-07-03 DIAGNOSIS — J209 Acute bronchitis, unspecified: Secondary | ICD-10-CM | POA: Diagnosis not present

## 2018-07-03 MED ORDER — NITROGLYCERIN 0.4 MG SL SUBL: 0.4000 mg | SUBLINGUAL_TABLET | SUBLINGUAL | 3 refills | Status: AC | PRN

## 2018-07-09 ENCOUNTER — Other Ambulatory Visit: Payer: Self-pay | Admitting: Cardiology

## 2018-07-10 ENCOUNTER — Telehealth: Payer: Self-pay | Admitting: Internal Medicine

## 2018-07-10 NOTE — Telephone Encounter (Signed)
Jan from [facility] ph# [phone] called re: Robert Gregory needs an office note from patient's last diabetic check up within the last 6 months including the condition of the patient's feet. Note needs to be signed by an M.D. Only. If note does not include the condition the Dr. Lilian Coma sign the foot note. Jan is faxing over the foot note. Please call Jan at the ph# listed herein if you have any questions.

## 2018-07-12 NOTE — Telephone Encounter (Signed)
Aniceto Boss, have you seen this come in?

## 2018-07-12 NOTE — Telephone Encounter (Signed)
We have received two fax cover sheets but nothing else. I called them and let them know to fax again.

## 2018-07-12 NOTE — Telephone Encounter (Signed)
All paperwork haas been pulled from front and back and sorted and passed out

## 2018-07-14 NOTE — Telephone Encounter (Signed)
Forms for DM shoes filled out, signed by Dr. Cruzita Lederer and faxed to [facility] with confirmation.

## 2018-07-17 DIAGNOSIS — Z23 Encounter for immunization: Secondary | ICD-10-CM | POA: Diagnosis not present

## 2018-07-27 ENCOUNTER — Encounter: Payer: Self-pay | Admitting: Internal Medicine

## 2018-08-03 DIAGNOSIS — I10 Essential (primary) hypertension: Secondary | ICD-10-CM | POA: Diagnosis not present

## 2018-08-03 DIAGNOSIS — E559 Vitamin D deficiency, unspecified: Secondary | ICD-10-CM | POA: Diagnosis not present

## 2018-08-03 DIAGNOSIS — D696 Thrombocytopenia, unspecified: Secondary | ICD-10-CM | POA: Diagnosis not present

## 2018-08-03 DIAGNOSIS — Z794 Long term (current) use of insulin: Secondary | ICD-10-CM | POA: Diagnosis not present

## 2018-08-03 DIAGNOSIS — E1122 Type 2 diabetes mellitus with diabetic chronic kidney disease: Secondary | ICD-10-CM | POA: Diagnosis not present

## 2018-08-03 DIAGNOSIS — E7800 Pure hypercholesterolemia, unspecified: Secondary | ICD-10-CM | POA: Diagnosis not present

## 2018-08-03 DIAGNOSIS — I2510 Atherosclerotic heart disease of native coronary artery without angina pectoris: Secondary | ICD-10-CM | POA: Diagnosis not present

## 2018-08-03 DIAGNOSIS — N183 Chronic kidney disease, stage 3 (moderate): Secondary | ICD-10-CM | POA: Diagnosis not present

## 2018-08-03 DIAGNOSIS — I4891 Unspecified atrial fibrillation: Secondary | ICD-10-CM | POA: Diagnosis not present

## 2018-08-16 ENCOUNTER — Inpatient Hospital Stay: Payer: Medicare Other | Attending: Hematology & Oncology

## 2018-08-16 ENCOUNTER — Ambulatory Visit: Payer: Medicare Other | Admitting: Family

## 2018-08-16 DIAGNOSIS — D696 Thrombocytopenia, unspecified: Secondary | ICD-10-CM | POA: Diagnosis not present

## 2018-08-16 DIAGNOSIS — K769 Liver disease, unspecified: Secondary | ICD-10-CM

## 2018-08-16 DIAGNOSIS — D631 Anemia in chronic kidney disease: Secondary | ICD-10-CM

## 2018-08-16 DIAGNOSIS — N183 Chronic kidney disease, stage 3 (moderate): Secondary | ICD-10-CM

## 2018-08-16 LAB — CMP (CANCER CENTER ONLY)
ALBUMIN: 4.1 g/dL (ref 3.5–5.0)
ALK PHOS: 81 U/L (ref 38–126)
ALT: 45 U/L — AB (ref 0–44)
AST: 45 U/L — ABNORMAL HIGH (ref 15–41)
Anion gap: 10 (ref 5–15)
BILIRUBIN TOTAL: 0.8 mg/dL (ref 0.3–1.2)
BUN: 30 mg/dL — ABNORMAL HIGH (ref 8–23)
CALCIUM: 9.8 mg/dL (ref 8.9–10.3)
CO2: 26 mmol/L (ref 22–32)
CREATININE: 1.32 mg/dL — AB (ref 0.61–1.24)
Chloride: 100 mmol/L (ref 98–111)
GFR, EST NON AFRICAN AMERICAN: 52 mL/min — AB (ref >60)
GFR, Est AFR Am: 60 mL/min — ABNORMAL LOW (ref >60)
Glucose, Bld: 253 mg/dL — ABNORMAL HIGH (ref 70–99)
Potassium: 4.6 mmol/L (ref 3.5–5.1)
Sodium: 136 mmol/L (ref 135–145)
TOTAL PROTEIN: 6.7 g/dL (ref 6.5–8.1)

## 2018-08-16 LAB — CBC WITH DIFFERENTIAL (CANCER CENTER ONLY)
Abs Immature Granulocytes: 0.01 10*3/uL (ref 0.00–0.07)
BASOS ABS: 0 10*3/uL (ref 0.0–0.1)
Basophils Relative: 1 %
Eosinophils Absolute: 0.1 10*3/uL (ref 0.0–0.5)
Eosinophils Relative: 3 %
HEMATOCRIT: 34.3 % — AB (ref 39.0–52.0)
HEMOGLOBIN: 10.6 g/dL — AB (ref 13.0–17.0)
IMMATURE GRANULOCYTES: 0 %
LYMPHS ABS: 0.5 10*3/uL — AB (ref 0.7–4.0)
LYMPHS PCT: 14 %
MCH: 28 pg (ref 26.0–34.0)
MCHC: 30.9 g/dL (ref 30.0–36.0)
MCV: 90.7 fL (ref 80.0–100.0)
Monocytes Absolute: 0.6 10*3/uL (ref 0.1–1.0)
Monocytes Relative: 17 %
NEUTROS ABS: 2.4 10*3/uL (ref 1.7–7.7)
NEUTROS PCT: 65 %
NRBC: 0 % (ref 0.0–0.2)
Platelet Count: 74 10*3/uL — ABNORMAL LOW (ref 150–400)
RBC: 3.78 MIL/uL — ABNORMAL LOW (ref 4.22–5.81)
RDW: 16.6 % — ABNORMAL HIGH (ref 11.5–15.5)
WBC Count: 3.6 10*3/uL — ABNORMAL LOW (ref 4.0–10.5)

## 2018-08-16 LAB — PLATELET BY CITRATE

## 2018-08-18 ENCOUNTER — Encounter: Payer: Self-pay | Admitting: *Deleted

## 2018-08-21 ENCOUNTER — Other Ambulatory Visit: Payer: Self-pay | Admitting: Hematology & Oncology

## 2018-08-21 ENCOUNTER — Inpatient Hospital Stay: Payer: Medicare Other

## 2018-08-21 DIAGNOSIS — D510 Vitamin B12 deficiency anemia due to intrinsic factor deficiency: Secondary | ICD-10-CM

## 2018-08-21 DIAGNOSIS — D500 Iron deficiency anemia secondary to blood loss (chronic): Secondary | ICD-10-CM

## 2018-08-22 ENCOUNTER — Inpatient Hospital Stay: Payer: Medicare Other | Attending: Hematology & Oncology

## 2018-08-22 DIAGNOSIS — E611 Iron deficiency: Secondary | ICD-10-CM | POA: Insufficient documentation

## 2018-08-22 DIAGNOSIS — D510 Vitamin B12 deficiency anemia due to intrinsic factor deficiency: Secondary | ICD-10-CM | POA: Diagnosis not present

## 2018-08-22 DIAGNOSIS — D500 Iron deficiency anemia secondary to blood loss (chronic): Secondary | ICD-10-CM

## 2018-08-22 DIAGNOSIS — D6959 Other secondary thrombocytopenia: Secondary | ICD-10-CM | POA: Diagnosis not present

## 2018-08-22 DIAGNOSIS — R161 Splenomegaly, not elsewhere classified: Secondary | ICD-10-CM | POA: Diagnosis not present

## 2018-08-22 LAB — RETICULOCYTES
IMMATURE RETIC FRACT: 17.7 % — AB (ref 2.3–15.9)
RBC.: 3.91 MIL/uL — ABNORMAL LOW (ref 4.22–5.81)
Retic Count, Absolute: 66.9 10*3/uL (ref 19.0–186.0)
Retic Ct Pct: 1.7 % (ref 0.4–3.1)

## 2018-08-22 LAB — SAVE SMEAR(SSMR), FOR PROVIDER SLIDE REVIEW

## 2018-08-22 LAB — VITAMIN B12: Vitamin B-12: 140 pg/mL — ABNORMAL LOW (ref 180–914)

## 2018-08-23 ENCOUNTER — Ambulatory Visit (INDEPENDENT_AMBULATORY_CARE_PROVIDER_SITE_OTHER): Payer: Medicare Other

## 2018-08-23 DIAGNOSIS — I255 Ischemic cardiomyopathy: Secondary | ICD-10-CM

## 2018-08-23 LAB — IRON AND TIBC
IRON: 45 ug/dL (ref 42–163)
SATURATION RATIOS: 10 % — AB (ref 20–55)
TIBC: 433 ug/dL — AB (ref 202–409)
UIBC: 388 ug/dL — AB (ref 117–376)

## 2018-08-23 LAB — SOLUBLE TRANSFERRIN RECEPTOR: Transferrin Receptor: 52.4 nmol/L — ABNORMAL HIGH (ref 12.2–27.3)

## 2018-08-23 LAB — FERRITIN: FERRITIN: 153 ng/mL (ref 24–336)

## 2018-08-23 NOTE — Progress Notes (Signed)
Remote ICD transmission.   

## 2018-08-24 ENCOUNTER — Ambulatory Visit (INDEPENDENT_AMBULATORY_CARE_PROVIDER_SITE_OTHER): Payer: Medicare Other | Admitting: Internal Medicine

## 2018-08-24 ENCOUNTER — Other Ambulatory Visit: Payer: Self-pay | Admitting: Gastroenterology

## 2018-08-24 ENCOUNTER — Encounter: Payer: Self-pay | Admitting: Internal Medicine

## 2018-08-24 ENCOUNTER — Ambulatory Visit: Payer: Medicare Other | Admitting: Internal Medicine

## 2018-08-24 ENCOUNTER — Encounter: Payer: Self-pay | Admitting: Hematology & Oncology

## 2018-08-24 ENCOUNTER — Other Ambulatory Visit: Payer: Self-pay | Admitting: Hematology & Oncology

## 2018-08-24 VITALS — BP 150/80 | HR 88 | Ht 73.0 in | Wt 327.0 lb

## 2018-08-24 DIAGNOSIS — E7800 Pure hypercholesterolemia, unspecified: Secondary | ICD-10-CM | POA: Diagnosis not present

## 2018-08-24 DIAGNOSIS — E1122 Type 2 diabetes mellitus with diabetic chronic kidney disease: Secondary | ICD-10-CM

## 2018-08-24 DIAGNOSIS — R140 Abdominal distension (gaseous): Secondary | ICD-10-CM | POA: Diagnosis not present

## 2018-08-24 DIAGNOSIS — Z794 Long term (current) use of insulin: Secondary | ICD-10-CM

## 2018-08-24 DIAGNOSIS — N183 Chronic kidney disease, stage 3 unspecified: Secondary | ICD-10-CM

## 2018-08-24 DIAGNOSIS — I255 Ischemic cardiomyopathy: Secondary | ICD-10-CM

## 2018-08-24 DIAGNOSIS — D510 Vitamin B12 deficiency anemia due to intrinsic factor deficiency: Secondary | ICD-10-CM

## 2018-08-24 DIAGNOSIS — K7460 Unspecified cirrhosis of liver: Secondary | ICD-10-CM | POA: Diagnosis not present

## 2018-08-24 DIAGNOSIS — K909 Intestinal malabsorption, unspecified: Secondary | ICD-10-CM | POA: Insufficient documentation

## 2018-08-24 DIAGNOSIS — D500 Iron deficiency anemia secondary to blood loss (chronic): Secondary | ICD-10-CM | POA: Insufficient documentation

## 2018-08-24 HISTORY — DX: Vitamin B12 deficiency anemia due to intrinsic factor deficiency: D51.0

## 2018-08-24 HISTORY — DX: Intestinal malabsorption, unspecified: K90.9

## 2018-08-24 HISTORY — DX: Iron deficiency anemia secondary to blood loss (chronic): D50.0

## 2018-08-24 LAB — POCT GLYCOSYLATED HEMOGLOBIN (HGB A1C): Hemoglobin A1C: 7 % — AB (ref 4.0–5.6)

## 2018-08-24 NOTE — Progress Notes (Signed)
Patient ID: Robert Gregory, male   DOB: 1941-01-25, 77 y.o.   MRN: [id number]  HPI: Robert Gregory is a 77 y.o.-year-old male, returning for f/u for DM2, dx in 2004, insulin-dependent, uncontrolled, with complications (CAD - Ischemic CMP, s/p CABG x5 in 1997, CHF; CKD stage 3). Last visit 2 months ago.  Since last visit, he had a lot of problems with mild low blood sugars (he feels sugars lower than 120) especially in the evening and during the night.  He is frustrated with his regimen.  The latest changes were done when I was out of the office, at the beginning of last month, by my colleague.  However, he started to see lower blood sugars even before the changes.  Last hemoglobin A1c was: Lab Results  Component Value Date   HGBA1C 7.8 (A) 05/15/2018   HGBA1C 8.0 (A) 02/17/2018   HGBA1C 8.1 11/10/2017   Pt is on a regimen of (with changes made 07/28/2018): - Metformin ER 2000 mg with dinner - Lantus 30 >> 20 units at bedtime - U500 insulin: -125 units before breakfast >> 140 units -90 units before lunch  >> 100 units -25 units before dinner >> stopped He was on Cinnamon 500 mg 2x a day He gained 50 lbs with Actos in the past.  He was on Invokana 100 mg in am in the past. We stopped Bydureon at last visit 2/2 severe constipation >> resolved.  Pt checks his sugars 4-8 times a day-reviewed his excellent log: - am:110-229 >> 130, 162-204, 230, 375 >> 71, 85-287 - 2h after b'fast: 204 >> 131-219, 396 >> 141-254 - before lunch: 222-369 >> 189-388 >> 134, 178-284 - 2h after lunch: 233, 315 >> 277-369 >> n/c >> 222-329 - before dinner: 127-298 >> 83-364 >> 8, 108-322 - 2h after dinner:  88, 163-279, 354 >> 96-242 >> 134-255, 490 (TxGiving) - bedtime:  115-192 >> 70-287 >> 96-230, 336 - nighttime:  132 >> 67--158 >> 84-319 Lowest sugar: 67 >> 70 >> 71; he has hypoglycemia awareness in the low 100s. Highest sugar: 513 x1 (steroids) >> 425 >> 490.  Glucometer: [employer] 2  Pt's meals  are: - Breakfast: cereals + 2% milk, sausage + eggs + toast; still OJ - Lunch: soup + sandwich (lightest) - Dinner: meat + veggies + starch  - Snacks: 1- pm: peanuts, V8 juice  - + CKD, last BUN/creatinine:  Lab Results  Component Value Date   BUN 30 (H) 08/16/2018   CREATININE 1.32 (H) 08/16/2018  On Benicar 80.  Reviewed GFR levels: Lab Results  Component Value Date   GFRNONAA 52 (L) 08/16/2018   GFRNONAA 38 (L) 05/25/2018   GFRNONAA 38 (L) 09/26/2017   GFRNONAA 61 (L) 12/01/2014   GFRNONAA 43 (L) 11/30/2014   GFRNONAA 49 (L) 11/19/2014   GFRNONAA 56 (L) 09/18/2014   GFRNONAA 37 (L) 09/11/2014   GFRNONAA 46 (L) 09/03/2014   GFRNONAA 56 (L) 07/31/2014   -+ HL; last set of lipids: 07/2018: tChol 115 Lab Results  Component Value Date   CHOL 123 08/03/2017   HDL 26 (L) 08/03/2017   LDLCALC 26 08/03/2017   TRIG 355 (H) 08/03/2017   CHOLHDL 4.7 08/03/2017  On pravastatin 80, generic Lovaza. - last eye exam was in 12/2017: No DR -He denies numbness and tingling in his feet.  He has a history of a right diabetic foot ulcer developed after removal of a callus >> followed by Dr. Zigmund Daniel (Wound Care).  This  is healed.  ROS: Constitutional: no weight gain/+ weight loss, + fatigue, no subjective hyperthermia, no subjective hypothermia Eyes: no blurry vision, no xerophthalmia ENT: no sore throat, no nodules palpated in neck, no dysphagia, no odynophagia, no hoarseness Cardiovascular: no CP/+ SOB/no palpitations/no leg swelling Respiratory: no cough/+ SOB/no wheezing Gastrointestinal: no N/no V/no D/no C/+ acid reflux Musculoskeletal: no muscle aches/no joint aches Skin: no rashes, no hair loss Neurological: no tremors/no numbness/no tingling/no dizziness  I reviewed pt's medications, allergies, PMH, social hx, family hx, and changes were documented in the history of present illness. Otherwise, unchanged from my initial visit note.  Past Medical History:  Diagnosis Date  .  Arthritis   . Atrial fibrillation ([place])    takes Xarelto daily  . CAD (coronary artery disease)   . Cardiomyopathy, ischemic 11/07/2013  . Cholelithiasis   . Chronic back pain    scoliosis/arthritis  . Complication of anesthesia    forgetful for about 2-3wks after anesthesia  . Congestive heart failure ([place])    takes Spironolactone daily  . Diabetes mellitus ([place])    takes Invokamet daily as well as Hum U  . History of colon polyps   . History of kidney stones   . Hyperlipidemia    takes [employer] daily  . Hyperlipidemia 11/07/2013  . Hypertension    takes Benicar and Coreg daily  . ICD (implantable cardioverter-defibrillator) battery depletion   . ICD (implantable cardioverter-defibrillator) in place 11/07/2013  . Ischemic cardiomyopathy   . Ischemic cardiomyopathy   . Joint pain   . Myocardial infarction ([place]) 1997   on the OR table   . Nephrolithiasis   . Obstructive sleep apnea   . Peripheral neuropathy   . Pneumonia    as a child  . Spinal stenosis   . Thrombocytopenia ([place])   . Total knee replacement status 11/29/2014  . Type 2 diabetes mellitus with stage 3 chronic kidney disease ([place]) 05/19/2015  . Urinary frequency   . Urinary urgency    Past Surgical History:  Procedure Laterality Date  . CARDIAC CATHETERIZATION N/A 10/02/2015   Procedure: Left Heart Cath and Cors/Grafts Angiography;  Surgeon: Belva Crome, MD;  Location: [facility];  Service: Cardiovascular;  Laterality: N/A;  . COLONOSCOPY    . coronary artery bypass and graft  1997   x 5  . ESOPHAGOGASTRODUODENOSCOPY    . ICD placed    . LAMINECTOMY    . LITHOTRIPSY    . Right rotator cuff surgery    . TONSILLECTOMY     adenoids  . TOTAL KNEE ARTHROPLASTY Left   . TOTAL KNEE ARTHROPLASTY Right 11/29/2014   Procedure: RIGHT TOTAL KNEE ARTHROPLASTY;  Surgeon: Leandrew Koyanagi, MD;  Location: [facility];  Service: Orthopedics;  Laterality: Right;   Social History   Social History  .  Marital status: Married    Spouse name: N/A  . Number of children: N/A   Occupational History  .      Retired from [employer]  . Smoking status: Never Smoker  . Smokeless tobacco: Never Used  . Alcohol use 0.0 oz/week     Comment: once drink a month  . Drug use: No   Current Outpatient Medications on File Prior to Visit  Medication Sig Dispense Refill  . amoxicillin (AMOXIL) 500 MG capsule Take 500 mg by mouth as directed. 6 prior to dental work    . BD INSULIN SYRINGE U-500 31G X  6MM 0.5 ML MISC USE 1 SYRINGE TO INJECT UNDER THE SKIN THREE TIMES A DAY BEFORE MEALS 300 each 3  . BENICAR 40 MG tablet TAKE 1 TABLET TWICE A DAY 180 tablet 3  . carvedilol (COREG) 6.25 MG tablet Take 1 tablet (6.25 mg total) by mouth 2 (two) times daily with a meal. 90 tablet 3  . cholecalciferol (VITAMIN D) 1000 units tablet Take 1,000 Units by mouth daily.    . Coenzyme Q10 200 MG TABS Take 1 tablet by mouth daily.    . Cranberry 200 MG CAPS Take 200 mg by mouth daily.     Marland Kitchen ezetimibe (ZETIA) 10 MG tablet TAKE 1 TABLET DAILY 90 tablet 4  . furosemide (LASIX) 40 MG tablet TAKE 1 TABLET DAILY 90 tablet 3  . Insulin Glargine (LANTUS SOLOSTAR) 100 UNIT/ML Solostar Pen Inject 30 Units into the skin daily at 10 pm. 10 pen 3  . insulin regular human CONCENTRATED (HUMULIN R) 500 UNIT/ML injection Inject up to 200 units a day as advised 3 vial 3  . KLOR-CON 10 10 MEQ tablet TAKE 2 TABLETS (20 MEQ TOTAL) TWICE A DAY 360 tablet 3  . metFORMIN (GLUCOPHAGE-XR) 500 MG 24 hr tablet TAKE 4 TABLETS (2000 MG) WITH DINNER 360 tablet 4  . Multiple Vitamins-Minerals (CENTRUM SILVER PO) Take 1 tablet by mouth daily.    . nitroGLYCERIN (NITROSTAT) 0.4 MG SL tablet Place 1 tablet (0.4 mg total) under the tongue every 5 (five) minutes as needed for chest pain. 75 tablet 3  . NON FORMULARY Methyfolate 2000 MCG daily    . omega-3 acid ethyl esters (LOVAZA) 1 g capsule TAKE 2 CAPSULES TWICE A DAY 360  capsule 1  . pravastatin (PRAVACHOL) 80 MG tablet TAKE 1 TABLET DAILY 90 tablet 3  . Probiotic Product (PROBIOTIC DAILY PO) Take 1 tablet by mouth daily.    Marland Kitchen spironolactone (ALDACTONE) 25 MG tablet TAKE 1 TABLET DAILY 90 tablet 3  . TRICOR 48 MG tablet TAKE 1 TABLET DAILY 90 tablet 4  . XARELTO 20 MG TABS tablet TAKE 1 TABLET DAILY WITH SUPPER 90 tablet 3   No current facility-administered medications on file prior to visit.    Allergies  Allergen Reactions  . Iodine     flushing  . Seasonal Ic [Cholestatin]   . Contrast Media [Iodinated Diagnostic Agents] Rash and Other (See Comments)    flushing EXTREME FLUSHING    Family History  Problem Relation Age of Onset  . Heart disease Unknown        No family history   PE: BP (!) 150/80   Pulse 88   Ht 6' 1"  (1.854 m) Comment: measured  Wt (!) 327 lb (148.3 kg)   SpO2 98%   BMI 43.14 kg/m  Body mass index is 43.14 kg/m. Wt Readings from Last 3 Encounters:  08/24/18 (!) 327 lb (148.3 kg)  06/29/18 (!) 332 lb (150.6 kg)  05/31/18 (!) 339 lb 1.9 oz (153.8 kg)   Constitutional: Obese, in NAD Eyes: PERRLA, EOMI, no exophthalmos ENT: moist mucous membranes, no thyromegaly, no cervical lymphadenopathy Cardiovascular: RRR, No MRG, + BLE edema Respiratory: CTA B Gastrointestinal: abdomen soft, NT, ND, BS+ Musculoskeletal: no deformities, strength intact in all 4 Skin: moist, warm, no rashes Neurological: + Tremor with outstretched hands, DTR normal in all 4  ASSESSMENT: 1. DM2, insulin-dependent, uncontrolled, with complications - CAD, s/p CABG 5x in 1997 (Dr Roxy Horseman), iCMP >> CHF (Dr Stanford Breed) - CKD stage 3  2. Obesity class 3  3. HL  PLAN:  1. Patient with longstanding, uncontrolled, type 2 diabetes, very insulin resistant, on metformin and U500 insulin.  At last visit, we also added Lantus at night as he had low blood sugars overnight and we could not increase to 500 insulin with dinner. -He was previously on a GLP-1  receptor agonist which we had to stop due to severe constipation and also on an SGLT2 inhibitor which we had to stop due to dehydration and increased GFR. -We discussed about the need to lose weight to improve his insulin resistance and subsequently his diabetes control.  However, at last visit, since sugars were still high, we added back to Lantus and increased his U500 insulin doses with breakfast and lunch but decreased to once with dinner due to mild lows overnight.  At that time, his sugars were high before lunch as he was having an a.m. snack.  I strongly advised him to stop this.  He did stop his snacks during the day and I believe that this is when his sugars started to drop.  The changes made at the beginning of last month did not help too much so he is still afraid that he will be dropping his sugars overnight if he does not try to raise it significantly before he goes to bed and have a snack at bedtime.  He is frustrated about this. -At this visit we reviewed together his sugar logs.  His sugars are above goal in the morning, they are much higher before lunch and then they start to decrease after this meal.  I advised him to increase his insulin dose before breakfast, decrease to 1 before lunch and stop Lantus.  We will also not be using U500 insulin before dinner, since he is sensitive to this and may drop his sugars overnight after bolusing with dinner.  However, I advised him that if his sugars do increase, he may need to add a lower dose of U500 insulin with dinner. - I advised him to:  Patient Instructions  Please continue: - Metformin ER 2000 mg with dinner  Please change: - U500 insulin: -150 units before breakfast -70 units before lunch  If the sugars increase after dinner, may need to add 15 units of U500 before this meal.   For now, hold Lantus.  Please send me your log after 1-2 weeks.   Please return in 1.5 months with your sugar log.  - today, HbA1c is 7.0% (improved) -  continue checking sugars at different times of the day - check 4x a day, rotating checks - advised for yearly eye exams >> he is UTD - Return to clinic in 3 mo with sugar log     2. Obesity class 3 -Refused gastric bypass surgery -Unfortunately, we could not continue GLP-1 receptor agonist due to severe constipation and we also had to stop SGLT2 inhibitor due to dehydration and lower GFR. -We discussed repeatedly about the need to improve his diet.  At last visit, I suggested to reduce a.m. snacks as a first step. -in last 3 mo >> he lost 12 lbs! I congratulated him  3. HL - Reviewed latest lipid panel from 07/2017: HDL low, triglycerides high, LDL at goal Lab Results  Component Value Date   CHOL 123 08/03/2017   HDL 26 (L) 08/03/2017   LDLCALC 26 08/03/2017   TRIG 355 (H) 08/03/2017   CHOLHDL 4.7 08/03/2017  - Continues high-dose pravastatin and generic Lovaza without side  effects.    Philemon Kingdom, MD PhD [facility]

## 2018-08-24 NOTE — Addendum Note (Signed)
Addended by: Cardell Peach I on: 08/24/2018 01:50 PM   Modules accepted: Orders

## 2018-08-24 NOTE — Patient Instructions (Addendum)
Please continue: - Metformin ER 2000 mg with dinner  Please change: - U500 insulin: -150 units before breakfast -70 units before lunch  If the sugars increase after dinner, may need to add 15 units of U500 before this meal.   For now, hold Lantus.  Please send me your log after 1-2 weeks.   Please return in 1.5 months with your sugar log.

## 2018-08-25 ENCOUNTER — Telehealth: Payer: Self-pay | Admitting: Hematology & Oncology

## 2018-08-25 NOTE — Telephone Encounter (Signed)
Called pt to confirm 12/9 appt per Dr Marin Olp 12/6 sch msg. Pt stated he was told to come at 2 pm by MD. Made appt per request

## 2018-08-28 ENCOUNTER — Inpatient Hospital Stay (HOSPITAL_BASED_OUTPATIENT_CLINIC_OR_DEPARTMENT_OTHER): Payer: Medicare Other | Admitting: Hematology & Oncology

## 2018-08-28 ENCOUNTER — Telehealth: Payer: Self-pay | Admitting: Hematology & Oncology

## 2018-08-28 ENCOUNTER — Inpatient Hospital Stay: Payer: Medicare Other

## 2018-08-28 ENCOUNTER — Ambulatory Visit
Admission: RE | Admit: 2018-08-28 | Discharge: 2018-08-28 | Disposition: A | Discharge status: Home / Self Care | Payer: Medicare Other | Source: Ambulatory Visit | Attending: Gastroenterology | Admitting: Gastroenterology

## 2018-08-28 ENCOUNTER — Other Ambulatory Visit: Payer: Self-pay

## 2018-08-28 VITALS — BP 117/54 | HR 69 | Resp 16

## 2018-08-28 VITALS — BP 135/55 | HR 79 | Temp 99.2°F | Resp 20 | Wt 326.5 lb

## 2018-08-28 DIAGNOSIS — D510 Vitamin B12 deficiency anemia due to intrinsic factor deficiency: Secondary | ICD-10-CM

## 2018-08-28 DIAGNOSIS — K7460 Unspecified cirrhosis of liver: Secondary | ICD-10-CM | POA: Diagnosis not present

## 2018-08-28 DIAGNOSIS — E611 Iron deficiency: Secondary | ICD-10-CM | POA: Diagnosis not present

## 2018-08-28 DIAGNOSIS — D500 Iron deficiency anemia secondary to blood loss (chronic): Secondary | ICD-10-CM

## 2018-08-28 DIAGNOSIS — R161 Splenomegaly, not elsewhere classified: Secondary | ICD-10-CM | POA: Diagnosis not present

## 2018-08-28 DIAGNOSIS — D6959 Other secondary thrombocytopenia: Secondary | ICD-10-CM | POA: Diagnosis not present

## 2018-08-28 DIAGNOSIS — K909 Intestinal malabsorption, unspecified: Secondary | ICD-10-CM

## 2018-08-28 MED ORDER — SODIUM CHLORIDE 0.9 % IV SOLN
750.0000 mg | Freq: Once | INTRAVENOUS | Status: AC
  Administered 2018-08-28: 750 mg via INTRAVENOUS
  Filled 2018-08-28: qty 15

## 2018-08-28 MED ORDER — CYANOCOBALAMIN 1000 MCG/ML IJ SOLN
1000.0000 ug | Freq: Once | INTRAMUSCULAR | Status: AC
  Administered 2018-08-28: 1000 ug via INTRAMUSCULAR

## 2018-08-28 MED ORDER — CYANOCOBALAMIN 1000 MCG/ML IJ SOLN
INTRAMUSCULAR | Status: AC
  Filled 2018-08-28: qty 1

## 2018-08-28 MED ORDER — SODIUM CHLORIDE 0.9 % IV SOLN
Freq: Once | INTRAVENOUS | Status: AC
  Administered 2018-08-28: 15:00:00 via INTRAVENOUS
  Filled 2018-08-28: qty 250

## 2018-08-28 NOTE — Patient Instructions (Signed)
Cyanocobalamin, Vitamin B12 injection What is this medicine? CYANOCOBALAMIN (sye an oh koe BAL a min) is a man made form of vitamin B12. Vitamin B12 is used in the growth of healthy blood cells, nerve cells, and proteins in the body. It also helps with the metabolism of fats and carbohydrates. This medicine is used to treat people who can not absorb vitamin B12. This medicine may be used for other purposes; ask your health care provider or pharmacist if you have questions. COMMON BRAND NAME(S): B-12 Compliance Kit, B-12 Injection Kit, Cyomin, LA-12, Nutri-Twelve, Physicians EZ Use B-12, Primabalt What should I tell my health care provider before I take this medicine? They need to know if you have any of these conditions: -kidney disease -Leber's disease -megaloblastic anemia -an unusual or allergic reaction to cyanocobalamin, cobalt, other medicines, foods, dyes, or preservatives -pregnant or trying to get pregnant -breast-feeding How should I use this medicine? This medicine is injected into a muscle or deeply under the skin. It is usually given by a health care professional in a clinic or doctor's office. However, your doctor may teach you how to inject yourself. Follow all instructions. Talk to your pediatrician regarding the use of this medicine in children. Special care may be needed. Overdosage: If you think you have taken too much of this medicine contact a poison control center or emergency room at once. NOTE: This medicine is only for you. Do not share this medicine with others. What if I miss a dose? If you are given your dose at a clinic or doctor's office, call to reschedule your appointment. If you give your own injections and you miss a dose, take it as soon as you can. If it is almost time for your next dose, take only that dose. Do not take double or extra doses. What may interact with this medicine? -colchicine -heavy alcohol intake This list may not describe all possible  interactions. Give your health care provider a list of all the medicines, herbs, non-prescription drugs, or dietary supplements you use. Also tell them if you smoke, drink alcohol, or use illegal drugs. Some items may interact with your medicine. What should I watch for while using this medicine? Visit your doctor or health care professional regularly. You may need blood work done while you are taking this medicine. You may need to follow a special diet. Talk to your doctor. Limit your alcohol intake and avoid smoking to get the best benefit. What side effects may I notice from receiving this medicine? Side effects that you should report to your doctor or health care professional as soon as possible: -allergic reactions like skin rash, itching or hives, swelling of the face, lips, or tongue -blue tint to skin -chest tightness, pain -difficulty breathing, wheezing -dizziness -red, swollen painful area on the leg Side effects that usually do not require medical attention (report to your doctor or health care professional if they continue or are bothersome): -diarrhea -headache This list may not describe all possible side effects. Call your doctor for medical advice about side effects. You may report side effects to FDA at 1-800-FDA-1088. Where should I keep my medicine? Keep out of the reach of children. Store at room temperature between 15 and 30 degrees C (59 and 85 degrees F). Protect from light. Throw away any unused medicine after the expiration date. NOTE: This sheet is a summary. It may not cover all possible information. If you have questions about this medicine, talk to your doctor, pharmacist, or   health care provider.  2018 Elsevier/Gold Standard (2007-12-18 22:10:20) Ferric carboxymaltose injection What is this medicine? FERRIC CARBOXYMALTOSE (ferr-ik car-box-ee-mol-toes) is an iron complex. Iron is used to make healthy red blood cells, which carry oxygen and nutrients throughout the  body. This medicine is used to treat anemia in people with chronic kidney disease or people who cannot take iron by mouth. This medicine may be used for other purposes; ask your health care provider or pharmacist if you have questions. COMMON BRAND NAME(S): Injectafer What should I tell my health care provider before I take this medicine? They need to know if you have any of these conditions: -anemia not caused by low iron levels -high levels of iron in the blood -liver disease -an unusual or allergic reaction to iron, other medicines, foods, dyes, or preservatives -pregnant or trying to get pregnant -breast-feeding How should I use this medicine? This medicine is for infusion into a vein. It is given by a health care professional in a hospital or clinic setting. Talk to your pediatrician regarding the use of this medicine in children. Special care may be needed. Overdosage: If you think you have taken too much of this medicine contact a poison control center or emergency room at once. NOTE: This medicine is only for you. Do not share this medicine with others. What if I miss a dose? It is important not to miss your dose. Call your doctor or health care professional if you are unable to keep an appointment. What may interact with this medicine? Do not take this medicine with any of the following medications: -deferoxamine -dimercaprol -other iron products This medicine may also interact with the following medications: -chloramphenicol -deferasirox This list may not describe all possible interactions. Give your health care provider a list of all the medicines, herbs, non-prescription drugs, or dietary supplements you use. Also tell them if you smoke, drink alcohol, or use illegal drugs. Some items may interact with your medicine. What should I watch for while using this medicine? Visit your doctor or health care professional regularly. Tell your doctor if your symptoms do not start to get  better or if they get worse. You may need blood work done while you are taking this medicine. You may need to follow a special diet. Talk to your doctor. Foods that contain iron include: whole grains/cereals, dried fruits, beans, or peas, leafy green vegetables, and organ meats (liver, kidney). What side effects may I notice from receiving this medicine? Side effects that you should report to your doctor or health care professional as soon as possible: -allergic reactions like skin rash, itching or hives, swelling of the face, lips, or tongue -breathing problems -changes in blood pressure -feeling faint or lightheaded, falls -flushing, sweating, or hot feelings Side effects that usually do not require medical attention (report to your doctor or health care professional if they continue or are bothersome): -changes in taste -constipation -dizziness -headache -nausea -pain, redness, or irritation at site where injected -vomiting This list may not describe all possible side effects. Call your doctor for medical advice about side effects. You may report side effects to FDA at 1-800-FDA-1088. Where should I keep my medicine? This drug is given in a hospital or clinic and will not be stored at home. NOTE: This sheet is a summary. It may not cover all possible information. If you have questions about this medicine, talk to your doctor, pharmacist, or health care provider.  2018 Elsevier/Gold Standard (2015-10-09 11:20:47)

## 2018-08-28 NOTE — Progress Notes (Signed)
Hematology and Oncology Follow Up Visit  Robert Gregory [id number] 1941/02/03 77 y.o. 08/28/2018   Principle Diagnosis:    Chronic thrombocytopenia secondary to splenomegaly  1.57 m exophytic lesion lower pole left kidney  Iron def anemia -- GI blood loss  Pernicious Anemia  Current Therapy:    IV Iron  Vit B12  -- Induction Phase     Interim History:  Robert Gregory is back for follow-up.  We actually have found a couple new problems on him.  We found that he is iron deficient.  He was complained of fatigue and lethargy.  With the work-up, we found that his iron was low.  His iron studies showed a ferritin of 153 with iron saturation of only 10%.  His vitamin B12 was only 140.  He is being followed by Dr. Paulita Fujita of gastroenterology.  An abdominal ultrasound was done today.  This did not show any ascites.  He does have some areas of fatty infiltration.  He also has a left renal lesion that measures 1.6 cm in the mid to lower pole.  This is unchanged.  He has not noted any melena or bright red blood per rectum.  His appetite is doing pretty well.  He is trying to lose some weight.  He has had no problems with fever.  He has had no cough.  There is been no mouth sores.  Overall, his performance status is ECOG 1.   Medications:  Current Outpatient Medications:  .  amoxicillin (AMOXIL) 500 MG capsule, Take 500 mg by mouth as directed. 6 prior to dental work, Disp: , Rfl:  .  BD INSULIN SYRINGE U-500 31G X 6MM 0.5 ML MISC, USE 1 SYRINGE TO INJECT UNDER THE SKIN THREE TIMES A DAY BEFORE MEALS, Disp: 300 each, Rfl: 3 .  BENICAR 40 MG tablet, TAKE 1 TABLET TWICE A DAY, Disp: 180 tablet, Rfl: 3 .  carvedilol (COREG) 6.25 MG tablet, Take 1 tablet (6.25 mg total) by mouth 2 (two) times daily with a meal., Disp: 90 tablet, Rfl: 3 .  Coenzyme Q10 200 MG TABS, Take 1 tablet by mouth daily., Disp: , Rfl:  .  Cranberry 200 MG CAPS, Take 200 mg by mouth daily. , Disp: , Rfl:  .  ezetimibe  (ZETIA) 10 MG tablet, TAKE 1 TABLET DAILY, Disp: 90 tablet, Rfl: 4 .  furosemide (LASIX) 40 MG tablet, TAKE 1 TABLET DAILY, Disp: 90 tablet, Rfl: 3 .  insulin regular human CONCENTRATED (HUMULIN R) 500 UNIT/ML injection, Inject up to 200 units a day as advised, Disp: 3 vial, Rfl: 3 .  KLOR-CON 10 10 MEQ tablet, TAKE 2 TABLETS (20 MEQ TOTAL) TWICE A DAY, Disp: 360 tablet, Rfl: 3 .  metFORMIN (GLUCOPHAGE-XR) 500 MG 24 hr tablet, TAKE 4 TABLETS (2000 MG) WITH DINNER, Disp: 360 tablet, Rfl: 4 .  Multiple Vitamins-Minerals (CENTRUM SILVER PO), Take 1 tablet by mouth daily., Disp: , Rfl:  .  nitroGLYCERIN (NITROSTAT) 0.4 MG SL tablet, Place 1 tablet (0.4 mg total) under the tongue every 5 (five) minutes as needed for chest pain., Disp: 75 tablet, Rfl: 3 .  NON FORMULARY, Methyfolate 2000 MCG daily, Disp: , Rfl:  .  omega-3 acid ethyl esters (LOVAZA) 1 g capsule, TAKE 2 CAPSULES TWICE A DAY, Disp: 360 capsule, Rfl: 1 .  pravastatin (PRAVACHOL) 80 MG tablet, TAKE 1 TABLET DAILY, Disp: 90 tablet, Rfl: 3 .  Probiotic Product (PROBIOTIC DAILY PO), Take 1 tablet by mouth daily., Disp: , Rfl:  .  spironolactone (ALDACTONE) 25 MG tablet, TAKE 1 TABLET DAILY, Disp: 90 tablet, Rfl: 3 .  TRICOR 48 MG tablet, TAKE 1 TABLET DAILY, Disp: 90 tablet, Rfl: 4 .  XARELTO 20 MG TABS tablet, TAKE 1 TABLET DAILY WITH SUPPER, Disp: 90 tablet, Rfl: 3  Allergies:  Allergies  Allergen Reactions  . Iodine     flushing  . Seasonal Ic [Cholestatin]   . Contrast Media [Iodinated Diagnostic Agents] Rash and Other (See Comments)    flushing EXTREME FLUSHING     Past Medical History, Surgical history, Social history, and Family History were reviewed and updated.  Review of Systems: Review of Systems  Constitutional: Negative.   HENT: Negative.   Eyes: Negative.   Respiratory: Negative.   Cardiovascular: Negative.   Gastrointestinal: Negative.   Genitourinary: Negative.   Musculoskeletal: Negative.   Skin: Negative.     Neurological: Negative.   Endo/Heme/Allergies: Negative.   Psychiatric/Behavioral: Negative.     Physical Exam:  weight is 326 lb 8 oz (148.1 kg) (abnormal). His oral temperature is 99.2 F (37.3 C). His blood pressure is 135/55 (abnormal) and his pulse is 79. His respiration is 20 and oxygen saturation is 100%.   Wt Readings from Last 3 Encounters:  08/28/18 (!) 326 lb 8 oz (148.1 kg)  08/24/18 (!) 327 lb (148.3 kg)  06/29/18 (!) 332 lb (150.6 kg)     Physical Exam  Constitutional: He is oriented to person, place, and time.  HENT:  Head: Normocephalic and atraumatic.  Mouth/Throat: Oropharynx is clear and moist.  Eyes: Pupils are equal, round, and reactive to light. EOM are normal.  Neck: Normal range of motion.  Cardiovascular: Normal rate, regular rhythm and normal heart sounds.  Pulmonary/Chest: Effort normal and breath sounds normal.  Abdominal: Soft. Bowel sounds are normal.  Musculoskeletal: Normal range of motion. He exhibits no edema, tenderness or deformity.  Lymphadenopathy:    He has no cervical adenopathy.  Neurological: He is alert and oriented to person, place, and time.  Skin: Skin is warm and dry. No rash noted. No erythema.  Psychiatric: He has a normal mood and affect. His behavior is normal. Judgment and thought content normal.  Vitals reviewed.    Lab Results  Component Value Date   WBC 3.6 (L) 08/16/2018   HGB 10.6 (L) 08/16/2018   HCT 34.3 (L) 08/16/2018   MCV 90.7 08/16/2018   PLT 74 (L) 08/16/2018     Chemistry      Component Value Date/Time   NA 136 08/16/2018 1133   NA 140 02/28/2017 1041   NA 141 04/21/2016 0852   K 4.6 08/16/2018 1133   K 4.0 02/28/2017 1041   K 4.4 04/21/2016 0852   CL 100 08/16/2018 1133   CL 105 02/28/2017 1041   CO2 26 08/16/2018 1133   CO2 25 02/28/2017 1041   CO2 22 04/21/2016 0852   BUN 30 (H) 08/16/2018 1133   BUN 30 (H) 02/28/2017 1041   BUN 29.0 (H) 04/21/2016 0852   CREATININE 1.32 (H) 08/16/2018  1133   CREATININE 1.46 (H) 11/07/2017 1455   CREATININE 1.6 (H) 04/21/2016 0852      Component Value Date/Time   CALCIUM 9.8 08/16/2018 1133   CALCIUM 9.7 02/28/2017 1041   CALCIUM 10.2 04/21/2016 0852   ALKPHOS 81 08/16/2018 1133   ALKPHOS 52 02/28/2017 1041   ALKPHOS 51 04/21/2016 0852   AST 45 (H) 08/16/2018 1133   AST 39 (H) 04/21/2016 0852   ALT 45 (H)  08/16/2018 1133   ALT 51 (H) 02/28/2017 1041   ALT 46 04/21/2016 0852   BILITOT 0.8 08/16/2018 1133   BILITOT 0.71 04/21/2016 0852         Impression and Plan: Robert Gregory is a 77 year old white male. He has moderate thrombocytopenia.   I would like to think that we will be able to improve his anemia and make him feel better.  Given that he has clear iron deficiency and also has underlying pernicious anemia, we really need to try to get his iron levels back up and his vitamin B12 level up.  I will have to check him for parietal cell antibodies and antibodies to intrinsic factor.  I would like to see him back in 6 weeks.  By then, we should see a nice rise in his hemoglobin and also he should be feeling better.    [name], MD 12/9/20193:25 PM

## 2018-08-28 NOTE — Telephone Encounter (Signed)
Appointments scheduled avs/calendar printed per 12/9 los

## 2018-08-29 ENCOUNTER — Inpatient Hospital Stay: Payer: Medicare Other

## 2018-08-29 ENCOUNTER — Encounter: Payer: Self-pay | Admitting: Cardiology

## 2018-08-29 VITALS — BP 124/41 | HR 74 | Temp 98.0°F | Resp 17 | Ht 73.0 in | Wt 326.5 lb

## 2018-08-29 DIAGNOSIS — D6959 Other secondary thrombocytopenia: Secondary | ICD-10-CM | POA: Diagnosis not present

## 2018-08-29 DIAGNOSIS — D500 Iron deficiency anemia secondary to blood loss (chronic): Secondary | ICD-10-CM

## 2018-08-29 DIAGNOSIS — K909 Intestinal malabsorption, unspecified: Secondary | ICD-10-CM

## 2018-08-29 DIAGNOSIS — D510 Vitamin B12 deficiency anemia due to intrinsic factor deficiency: Secondary | ICD-10-CM | POA: Diagnosis not present

## 2018-08-29 DIAGNOSIS — E611 Iron deficiency: Secondary | ICD-10-CM | POA: Diagnosis not present

## 2018-08-29 DIAGNOSIS — R161 Splenomegaly, not elsewhere classified: Secondary | ICD-10-CM | POA: Diagnosis not present

## 2018-08-29 MED ORDER — CYANOCOBALAMIN 1000 MCG/ML IJ SOLN
1000.0000 ug | Freq: Once | INTRAMUSCULAR | Status: AC
  Administered 2018-08-29: 1000 ug via INTRAMUSCULAR

## 2018-08-29 MED ORDER — CYANOCOBALAMIN 1000 MCG/ML IJ SOLN
INTRAMUSCULAR | Status: AC
  Filled 2018-08-29: qty 1

## 2018-08-30 ENCOUNTER — Inpatient Hospital Stay: Payer: Medicare Other

## 2018-08-30 ENCOUNTER — Other Ambulatory Visit: Payer: Self-pay

## 2018-08-30 VITALS — BP 112/44 | HR 90 | Temp 97.5°F | Resp 18

## 2018-08-30 DIAGNOSIS — D6959 Other secondary thrombocytopenia: Secondary | ICD-10-CM | POA: Diagnosis not present

## 2018-08-30 DIAGNOSIS — D510 Vitamin B12 deficiency anemia due to intrinsic factor deficiency: Secondary | ICD-10-CM | POA: Diagnosis not present

## 2018-08-30 DIAGNOSIS — E611 Iron deficiency: Secondary | ICD-10-CM | POA: Diagnosis not present

## 2018-08-30 DIAGNOSIS — K909 Intestinal malabsorption, unspecified: Secondary | ICD-10-CM

## 2018-08-30 DIAGNOSIS — R161 Splenomegaly, not elsewhere classified: Secondary | ICD-10-CM | POA: Diagnosis not present

## 2018-08-30 DIAGNOSIS — D500 Iron deficiency anemia secondary to blood loss (chronic): Secondary | ICD-10-CM

## 2018-08-30 MED ORDER — CYANOCOBALAMIN 1000 MCG/ML IJ SOLN
1000.0000 ug | Freq: Once | INTRAMUSCULAR | Status: AC
  Administered 2018-08-30: 1000 ug via INTRAMUSCULAR

## 2018-08-30 MED ORDER — CYANOCOBALAMIN 1000 MCG/ML IJ SOLN
INTRAMUSCULAR | Status: AC
  Filled 2018-08-30: qty 1

## 2018-08-30 NOTE — Patient Instructions (Signed)
Cyanocobalamin, Vitamin B12 injection What is this medicine? CYANOCOBALAMIN (sye an oh koe BAL a min) is a man made form of vitamin B12. Vitamin B12 is used in the growth of healthy blood cells, nerve cells, and proteins in the body. It also helps with the metabolism of fats and carbohydrates. This medicine is used to treat people who can not absorb vitamin B12. This medicine may be used for other purposes; ask your health care provider or pharmacist if you have questions. COMMON BRAND NAME(S): B-12 Compliance Kit, B-12 Injection Kit, Cyomin, LA-12, Nutri-Twelve, Physicians EZ Use B-12, Primabalt What should I tell my health care provider before I take this medicine? They need to know if you have any of these conditions: -kidney disease -Leber's disease -megaloblastic anemia -an unusual or allergic reaction to cyanocobalamin, cobalt, other medicines, foods, dyes, or preservatives -pregnant or trying to get pregnant -breast-feeding How should I use this medicine? This medicine is injected into a muscle or deeply under the skin. It is usually given by a health care professional in a clinic or doctor's office. However, your doctor may teach you how to inject yourself. Follow all instructions. Talk to your pediatrician regarding the use of this medicine in children. Special care may be needed. Overdosage: If you think you have taken too much of this medicine contact a poison control center or emergency room at once. NOTE: This medicine is only for you. Do not share this medicine with others. What if I miss a dose? If you are given your dose at a clinic or doctor's office, call to reschedule your appointment. If you give your own injections and you miss a dose, take it as soon as you can. If it is almost time for your next dose, take only that dose. Do not take double or extra doses. What may interact with this medicine? -colchicine -heavy alcohol intake This list may not describe all possible  interactions. Give your health care provider a list of all the medicines, herbs, non-prescription drugs, or dietary supplements you use. Also tell them if you smoke, drink alcohol, or use illegal drugs. Some items may interact with your medicine. What should I watch for while using this medicine? Visit your doctor or health care professional regularly. You may need blood work done while you are taking this medicine. You may need to follow a special diet. Talk to your doctor. Limit your alcohol intake and avoid smoking to get the best benefit. What side effects may I notice from receiving this medicine? Side effects that you should report to your doctor or health care professional as soon as possible: -allergic reactions like skin rash, itching or hives, swelling of the face, lips, or tongue -blue tint to skin -chest tightness, pain -difficulty breathing, wheezing -dizziness -red, swollen painful area on the leg Side effects that usually do not require medical attention (report to your doctor or health care professional if they continue or are bothersome): -diarrhea -headache This list may not describe all possible side effects. Call your doctor for medical advice about side effects. You may report side effects to FDA at 1-800-FDA-1088. Where should I keep my medicine? Keep out of the reach of children. Store at room temperature between 15 and 30 degrees C (59 and 85 degrees F). Protect from light. Throw away any unused medicine after the expiration date. NOTE: This sheet is a summary. It may not cover all possible information. If you have questions about this medicine, talk to your doctor, pharmacist, or   health care provider.  2018 Elsevier/Gold Standard (2007-12-18 22:10:20)

## 2018-08-31 ENCOUNTER — Inpatient Hospital Stay: Payer: Medicare Other

## 2018-08-31 VITALS — BP 134/60 | HR 85 | Temp 97.7°F | Resp 20

## 2018-08-31 DIAGNOSIS — D6959 Other secondary thrombocytopenia: Secondary | ICD-10-CM | POA: Diagnosis not present

## 2018-08-31 DIAGNOSIS — E611 Iron deficiency: Secondary | ICD-10-CM | POA: Diagnosis not present

## 2018-08-31 DIAGNOSIS — R161 Splenomegaly, not elsewhere classified: Secondary | ICD-10-CM | POA: Diagnosis not present

## 2018-08-31 DIAGNOSIS — D500 Iron deficiency anemia secondary to blood loss (chronic): Secondary | ICD-10-CM

## 2018-08-31 DIAGNOSIS — D510 Vitamin B12 deficiency anemia due to intrinsic factor deficiency: Secondary | ICD-10-CM | POA: Diagnosis not present

## 2018-08-31 DIAGNOSIS — K909 Intestinal malabsorption, unspecified: Secondary | ICD-10-CM

## 2018-08-31 MED ORDER — CYANOCOBALAMIN 1000 MCG/ML IJ SOLN
INTRAMUSCULAR | Status: AC
  Filled 2018-08-31: qty 1

## 2018-08-31 MED ORDER — CYANOCOBALAMIN 1000 MCG/ML IJ SOLN
1000.0000 ug | Freq: Once | INTRAMUSCULAR | Status: AC
  Administered 2018-08-31: 1000 ug via INTRAMUSCULAR

## 2018-09-01 ENCOUNTER — Other Ambulatory Visit: Payer: Self-pay

## 2018-09-01 ENCOUNTER — Inpatient Hospital Stay: Payer: Medicare Other

## 2018-09-01 VITALS — BP 117/43 | HR 82 | Temp 97.9°F | Resp 18

## 2018-09-01 DIAGNOSIS — K909 Intestinal malabsorption, unspecified: Secondary | ICD-10-CM

## 2018-09-01 DIAGNOSIS — D500 Iron deficiency anemia secondary to blood loss (chronic): Secondary | ICD-10-CM

## 2018-09-01 DIAGNOSIS — D510 Vitamin B12 deficiency anemia due to intrinsic factor deficiency: Secondary | ICD-10-CM | POA: Diagnosis not present

## 2018-09-01 DIAGNOSIS — E611 Iron deficiency: Secondary | ICD-10-CM | POA: Diagnosis not present

## 2018-09-01 DIAGNOSIS — R161 Splenomegaly, not elsewhere classified: Secondary | ICD-10-CM | POA: Diagnosis not present

## 2018-09-01 DIAGNOSIS — D6959 Other secondary thrombocytopenia: Secondary | ICD-10-CM | POA: Diagnosis not present

## 2018-09-01 MED ORDER — CYANOCOBALAMIN 1000 MCG/ML IJ SOLN
INTRAMUSCULAR | Status: AC
  Filled 2018-09-01: qty 1

## 2018-09-01 MED ORDER — CYANOCOBALAMIN 1000 MCG/ML IJ SOLN
1000.0000 ug | Freq: Once | INTRAMUSCULAR | Status: AC
  Administered 2018-09-01: 1000 ug via INTRAMUSCULAR

## 2018-09-01 NOTE — Patient Instructions (Signed)
Cyanocobalamin, Vitamin B12 injection What is this medicine? CYANOCOBALAMIN (sye an oh koe BAL a min) is a man made form of vitamin B12. Vitamin B12 is used in the growth of healthy blood cells, nerve cells, and proteins in the body. It also helps with the metabolism of fats and carbohydrates. This medicine is used to treat people who can not absorb vitamin B12. This medicine may be used for other purposes; ask your health care provider or pharmacist if you have questions. COMMON BRAND NAME(S): B-12 Compliance Kit, B-12 Injection Kit, Cyomin, LA-12, Nutri-Twelve, Physicians EZ Use B-12, Primabalt What should I tell my health care provider before I take this medicine? They need to know if you have any of these conditions: -kidney disease -Leber's disease -megaloblastic anemia -an unusual or allergic reaction to cyanocobalamin, cobalt, other medicines, foods, dyes, or preservatives -pregnant or trying to get pregnant -breast-feeding How should I use this medicine? This medicine is injected into a muscle or deeply under the skin. It is usually given by a health care professional in a clinic or doctor's office. However, your doctor may teach you how to inject yourself. Follow all instructions. Talk to your pediatrician regarding the use of this medicine in children. Special care may be needed. Overdosage: If you think you have taken too much of this medicine contact a poison control center or emergency room at once. NOTE: This medicine is only for you. Do not share this medicine with others. What if I miss a dose? If you are given your dose at a clinic or doctor's office, call to reschedule your appointment. If you give your own injections and you miss a dose, take it as soon as you can. If it is almost time for your next dose, take only that dose. Do not take double or extra doses. What may interact with this medicine? -colchicine -heavy alcohol intake This list may not describe all possible  interactions. Give your health care provider a list of all the medicines, herbs, non-prescription drugs, or dietary supplements you use. Also tell them if you smoke, drink alcohol, or use illegal drugs. Some items may interact with your medicine. What should I watch for while using this medicine? Visit your doctor or health care professional regularly. You may need blood work done while you are taking this medicine. You may need to follow a special diet. Talk to your doctor. Limit your alcohol intake and avoid smoking to get the best benefit. What side effects may I notice from receiving this medicine? Side effects that you should report to your doctor or health care professional as soon as possible: -allergic reactions like skin rash, itching or hives, swelling of the face, lips, or tongue -blue tint to skin -chest tightness, pain -difficulty breathing, wheezing -dizziness -red, swollen painful area on the leg Side effects that usually do not require medical attention (report to your doctor or health care professional if they continue or are bothersome): -diarrhea -headache This list may not describe all possible side effects. Call your doctor for medical advice about side effects. You may report side effects to FDA at 1-800-FDA-1088. Where should I keep my medicine? Keep out of the reach of children. Store at room temperature between 15 and 30 degrees C (59 and 85 degrees F). Protect from light. Throw away any unused medicine after the expiration date. NOTE: This sheet is a summary. It may not cover all possible information. If you have questions about this medicine, talk to your doctor, pharmacist, or   health care provider.  2018 [employer]vier/Gold Standard (2007-12-18 22:10:20)  

## 2018-09-04 ENCOUNTER — Ambulatory Visit: Payer: Medicare Other

## 2018-09-04 DIAGNOSIS — H527 Unspecified disorder of refraction: Secondary | ICD-10-CM | POA: Diagnosis not present

## 2018-09-04 DIAGNOSIS — H40033 Anatomical narrow angle, bilateral: Secondary | ICD-10-CM | POA: Diagnosis not present

## 2018-09-04 DIAGNOSIS — H02831 Dermatochalasis of right upper eyelid: Secondary | ICD-10-CM | POA: Diagnosis not present

## 2018-09-05 ENCOUNTER — Inpatient Hospital Stay: Payer: Medicare Other

## 2018-09-05 ENCOUNTER — Other Ambulatory Visit: Payer: Self-pay | Admitting: Family

## 2018-09-05 ENCOUNTER — Other Ambulatory Visit: Payer: Self-pay

## 2018-09-05 VITALS — BP 115/51 | HR 65 | Temp 98.0°F

## 2018-09-05 DIAGNOSIS — R6520 Severe sepsis without septic shock: Secondary | ICD-10-CM | POA: Diagnosis not present

## 2018-09-05 DIAGNOSIS — N179 Acute kidney failure, unspecified: Secondary | ICD-10-CM | POA: Diagnosis not present

## 2018-09-05 DIAGNOSIS — Z6841 Body Mass Index (BMI) 40.0 and over, adult: Secondary | ICD-10-CM | POA: Diagnosis not present

## 2018-09-05 DIAGNOSIS — I129 Hypertensive chronic kidney disease with stage 1 through stage 4 chronic kidney disease, or unspecified chronic kidney disease: Secondary | ICD-10-CM | POA: Diagnosis not present

## 2018-09-05 DIAGNOSIS — L03115 Cellulitis of right lower limb: Secondary | ICD-10-CM | POA: Diagnosis not present

## 2018-09-05 DIAGNOSIS — A419 Sepsis, unspecified organism: Secondary | ICD-10-CM | POA: Diagnosis not present

## 2018-09-05 DIAGNOSIS — K909 Intestinal malabsorption, unspecified: Secondary | ICD-10-CM | POA: Diagnosis not present

## 2018-09-05 DIAGNOSIS — D500 Iron deficiency anemia secondary to blood loss (chronic): Secondary | ICD-10-CM

## 2018-09-05 DIAGNOSIS — R531 Weakness: Secondary | ICD-10-CM | POA: Diagnosis not present

## 2018-09-05 MED ORDER — CYANOCOBALAMIN 1000 MCG/ML IJ SOLN
INTRAMUSCULAR | Status: AC
  Filled 2018-09-05: qty 1

## 2018-09-05 MED ORDER — SODIUM CHLORIDE 0.9 % IV SOLN
Freq: Once | INTRAVENOUS | Status: AC
  Administered 2018-09-05: 13:00:00 via INTRAVENOUS
  Filled 2018-09-05: qty 250

## 2018-09-05 MED ORDER — SODIUM CHLORIDE 0.9 % IV SOLN
750.0000 mg | Freq: Once | INTRAVENOUS | Status: AC
  Administered 2018-09-05: 750 mg via INTRAVENOUS
  Filled 2018-09-05: qty 15

## 2018-09-05 MED ORDER — CYANOCOBALAMIN 1000 MCG/ML IJ SOLN
1000.0000 ug | Freq: Once | INTRAMUSCULAR | Status: AC
  Administered 2018-09-05: 1000 ug via INTRAMUSCULAR

## 2018-09-05 NOTE — Patient Instructions (Signed)
Cyanocobalamin, Vitamin B12 injection What is this medicine? CYANOCOBALAMIN (sye an oh koe BAL a min) is a man made form of vitamin B12. Vitamin B12 is used in the growth of healthy blood cells, nerve cells, and proteins in the body. It also helps with the metabolism of fats and carbohydrates. This medicine is used to treat people who can not absorb vitamin B12. This medicine may be used for other purposes; ask your health care provider or pharmacist if you have questions. COMMON BRAND NAME(S): B-12 Compliance Kit, B-12 Injection Kit, Cyomin, LA-12, Nutri-Twelve, Physicians EZ Use B-12, Primabalt What should I tell my health care provider before I take this medicine? They need to know if you have any of these conditions: -kidney disease -Leber's disease -megaloblastic anemia -an unusual or allergic reaction to cyanocobalamin, cobalt, other medicines, foods, dyes, or preservatives -pregnant or trying to get pregnant -breast-feeding How should I use this medicine? This medicine is injected into a muscle or deeply under the skin. It is usually given by a health care professional in a clinic or doctor's office. However, your doctor may teach you how to inject yourself. Follow all instructions. Talk to your pediatrician regarding the use of this medicine in children. Special care may be needed. Overdosage: If you think you have taken too much of this medicine contact a poison control center or emergency room at once. NOTE: This medicine is only for you. Do not share this medicine with others. What if I miss a dose? If you are given your dose at a clinic or doctor's office, call to reschedule your appointment. If you give your own injections and you miss a dose, take it as soon as you can. If it is almost time for your next dose, take only that dose. Do not take double or extra doses. What may interact with this medicine? -colchicine -heavy alcohol intake This list may not describe all possible  interactions. Give your health care provider a list of all the medicines, herbs, non-prescription drugs, or dietary supplements you use. Also tell them if you smoke, drink alcohol, or use illegal drugs. Some items may interact with your medicine. What should I watch for while using this medicine? Visit your doctor or health care professional regularly. You may need blood work done while you are taking this medicine. You may need to follow a special diet. Talk to your doctor. Limit your alcohol intake and avoid smoking to get the best benefit. What side effects may I notice from receiving this medicine? Side effects that you should report to your doctor or health care professional as soon as possible: -allergic reactions like skin rash, itching or hives, swelling of the face, lips, or tongue -blue tint to skin -chest tightness, pain -difficulty breathing, wheezing -dizziness -red, swollen painful area on the leg Side effects that usually do not require medical attention (report to your doctor or health care professional if they continue or are bothersome): -diarrhea -headache This list may not describe all possible side effects. Call your doctor for medical advice about side effects. You may report side effects to FDA at 1-800-FDA-1088. Where should I keep my medicine? Keep out of the reach of children. Store at room temperature between 15 and 30 degrees C (59 and 85 degrees F). Protect from light. Throw away any unused medicine after the expiration date. NOTE: This sheet is a summary. It may not cover all possible information. If you have questions about this medicine, talk to your doctor, pharmacist, or   health care provider.  2018 Elsevier/Gold Standard (2007-12-18 22:10:20) Ferric carboxymaltose injection What is this medicine? FERRIC CARBOXYMALTOSE (ferr-ik car-box-ee-mol-toes) is an iron complex. Iron is used to make healthy red blood cells, which carry oxygen and nutrients throughout the  body. This medicine is used to treat anemia in people with chronic kidney disease or people who cannot take iron by mouth. This medicine may be used for other purposes; ask your health care provider or pharmacist if you have questions. COMMON BRAND NAME(S): Injectafer What should I tell my health care provider before I take this medicine? They need to know if you have any of these conditions: -anemia not caused by low iron levels -high levels of iron in the blood -liver disease -an unusual or allergic reaction to iron, other medicines, foods, dyes, or preservatives -pregnant or trying to get pregnant -breast-feeding How should I use this medicine? This medicine is for infusion into a vein. It is given by a health care professional in a hospital or clinic setting. Talk to your pediatrician regarding the use of this medicine in children. Special care may be needed. Overdosage: If you think you have taken too much of this medicine contact a poison control center or emergency room at once. NOTE: This medicine is only for you. Do not share this medicine with others. What if I miss a dose? It is important not to miss your dose. Call your doctor or health care professional if you are unable to keep an appointment. What may interact with this medicine? Do not take this medicine with any of the following medications: -deferoxamine -dimercaprol -other iron products This medicine may also interact with the following medications: -chloramphenicol -deferasirox This list may not describe all possible interactions. Give your health care provider a list of all the medicines, herbs, non-prescription drugs, or dietary supplements you use. Also tell them if you smoke, drink alcohol, or use illegal drugs. Some items may interact with your medicine. What should I watch for while using this medicine? Visit your doctor or health care professional regularly. Tell your doctor if your symptoms do not start to get  better or if they get worse. You may need blood work done while you are taking this medicine. You may need to follow a special diet. Talk to your doctor. Foods that contain iron include: whole grains/cereals, dried fruits, beans, or peas, leafy green vegetables, and organ meats (liver, kidney). What side effects may I notice from receiving this medicine? Side effects that you should report to your doctor or health care professional as soon as possible: -allergic reactions like skin rash, itching or hives, swelling of the face, lips, or tongue -breathing problems -changes in blood pressure -feeling faint or lightheaded, falls -flushing, sweating, or hot feelings Side effects that usually do not require medical attention (report to your doctor or health care professional if they continue or are bothersome): -changes in taste -constipation -dizziness -headache -nausea -pain, redness, or irritation at site where injected -vomiting This list may not describe all possible side effects. Call your doctor for medical advice about side effects. You may report side effects to FDA at 1-800-FDA-1088. Where should I keep my medicine? This drug is given in a hospital or clinic and will not be stored at home. NOTE: This sheet is a summary. It may not cover all possible information. If you have questions about this medicine, talk to your doctor, pharmacist, or health care provider.  2018 Elsevier/Gold Standard (2015-10-09 11:20:47)

## 2018-09-07 ENCOUNTER — Telehealth: Payer: Self-pay | Admitting: *Deleted

## 2018-09-07 ENCOUNTER — Other Ambulatory Visit: Payer: Self-pay

## 2018-09-07 ENCOUNTER — Emergency Department (HOSPITAL_COMMUNITY): Payer: Medicare Other

## 2018-09-07 ENCOUNTER — Inpatient Hospital Stay (HOSPITAL_COMMUNITY): Payer: Medicare Other

## 2018-09-07 ENCOUNTER — Encounter (HOSPITAL_COMMUNITY): Payer: Self-pay

## 2018-09-07 ENCOUNTER — Inpatient Hospital Stay (HOSPITAL_COMMUNITY)
Admission: EM | Admit: 2018-09-07 | Discharge: 2018-09-09 | DRG: 872 | Disposition: A | Discharge status: Home Health | Payer: Medicare Other | Attending: Family Medicine | Admitting: Family Medicine

## 2018-09-07 DIAGNOSIS — Z96643 Presence of artificial hip joint, bilateral: Secondary | ICD-10-CM | POA: Diagnosis present

## 2018-09-07 DIAGNOSIS — L03115 Cellulitis of right lower limb: Secondary | ICD-10-CM | POA: Diagnosis not present

## 2018-09-07 DIAGNOSIS — E785 Hyperlipidemia, unspecified: Secondary | ICD-10-CM | POA: Diagnosis not present

## 2018-09-07 DIAGNOSIS — R609 Edema, unspecified: Secondary | ICD-10-CM | POA: Diagnosis not present

## 2018-09-07 DIAGNOSIS — Z79899 Other long term (current) drug therapy: Secondary | ICD-10-CM

## 2018-09-07 DIAGNOSIS — N179 Acute kidney failure, unspecified: Secondary | ICD-10-CM

## 2018-09-07 DIAGNOSIS — I2510 Atherosclerotic heart disease of native coronary artery without angina pectoris: Secondary | ICD-10-CM | POA: Diagnosis not present

## 2018-09-07 DIAGNOSIS — R161 Splenomegaly, not elsewhere classified: Secondary | ICD-10-CM | POA: Diagnosis present

## 2018-09-07 DIAGNOSIS — D500 Iron deficiency anemia secondary to blood loss (chronic): Secondary | ICD-10-CM | POA: Diagnosis present

## 2018-09-07 DIAGNOSIS — R7989 Other specified abnormal findings of blood chemistry: Secondary | ICD-10-CM | POA: Diagnosis present

## 2018-09-07 DIAGNOSIS — I4891 Unspecified atrial fibrillation: Secondary | ICD-10-CM | POA: Diagnosis not present

## 2018-09-07 DIAGNOSIS — I252 Old myocardial infarction: Secondary | ICD-10-CM

## 2018-09-07 DIAGNOSIS — E1142 Type 2 diabetes mellitus with diabetic polyneuropathy: Secondary | ICD-10-CM | POA: Diagnosis present

## 2018-09-07 DIAGNOSIS — N183 Chronic kidney disease, stage 3 (moderate): Secondary | ICD-10-CM | POA: Diagnosis present

## 2018-09-07 DIAGNOSIS — A419 Sepsis, unspecified organism: Principal | ICD-10-CM | POA: Diagnosis present

## 2018-09-07 DIAGNOSIS — I959 Hypotension, unspecified: Secondary | ICD-10-CM | POA: Diagnosis not present

## 2018-09-07 DIAGNOSIS — Z8719 Personal history of other diseases of the digestive system: Secondary | ICD-10-CM

## 2018-09-07 DIAGNOSIS — Z888 Allergy status to other drugs, medicaments and biological substances status: Secondary | ICD-10-CM

## 2018-09-07 DIAGNOSIS — K909 Intestinal malabsorption, unspecified: Secondary | ICD-10-CM | POA: Diagnosis not present

## 2018-09-07 DIAGNOSIS — Z7901 Long term (current) use of anticoagulants: Secondary | ICD-10-CM

## 2018-09-07 DIAGNOSIS — Z951 Presence of aortocoronary bypass graft: Secondary | ICD-10-CM

## 2018-09-07 DIAGNOSIS — Z794 Long term (current) use of insulin: Secondary | ICD-10-CM

## 2018-09-07 DIAGNOSIS — I4510 Unspecified right bundle-branch block: Secondary | ICD-10-CM | POA: Diagnosis not present

## 2018-09-07 DIAGNOSIS — I129 Hypertensive chronic kidney disease with stage 1 through stage 4 chronic kidney disease, or unspecified chronic kidney disease: Secondary | ICD-10-CM | POA: Diagnosis not present

## 2018-09-07 DIAGNOSIS — R0602 Shortness of breath: Secondary | ICD-10-CM | POA: Diagnosis not present

## 2018-09-07 DIAGNOSIS — G4733 Obstructive sleep apnea (adult) (pediatric): Secondary | ICD-10-CM | POA: Diagnosis present

## 2018-09-07 DIAGNOSIS — R0902 Hypoxemia: Secondary | ICD-10-CM | POA: Diagnosis not present

## 2018-09-07 DIAGNOSIS — G8929 Other chronic pain: Secondary | ICD-10-CM | POA: Diagnosis present

## 2018-09-07 DIAGNOSIS — Z8249 Family history of ischemic heart disease and other diseases of the circulatory system: Secondary | ICD-10-CM

## 2018-09-07 DIAGNOSIS — D696 Thrombocytopenia, unspecified: Secondary | ICD-10-CM | POA: Diagnosis present

## 2018-09-07 DIAGNOSIS — E1122 Type 2 diabetes mellitus with diabetic chronic kidney disease: Secondary | ICD-10-CM | POA: Diagnosis present

## 2018-09-07 DIAGNOSIS — Z6841 Body Mass Index (BMI) 40.0 and over, adult: Secondary | ICD-10-CM

## 2018-09-07 DIAGNOSIS — Z95810 Presence of automatic (implantable) cardiac defibrillator: Secondary | ICD-10-CM

## 2018-09-07 DIAGNOSIS — Z87442 Personal history of urinary calculi: Secondary | ICD-10-CM

## 2018-09-07 DIAGNOSIS — I255 Ischemic cardiomyopathy: Secondary | ICD-10-CM | POA: Diagnosis not present

## 2018-09-07 DIAGNOSIS — Z91048 Other nonmedicinal substance allergy status: Secondary | ICD-10-CM

## 2018-09-07 DIAGNOSIS — Z91041 Radiographic dye allergy status: Secondary | ICD-10-CM

## 2018-09-07 DIAGNOSIS — R531 Weakness: Secondary | ICD-10-CM | POA: Diagnosis not present

## 2018-09-07 DIAGNOSIS — R000 Tachycardia, unspecified: Secondary | ICD-10-CM | POA: Diagnosis not present

## 2018-09-07 DIAGNOSIS — R6520 Severe sepsis without septic shock: Secondary | ICD-10-CM | POA: Diagnosis not present

## 2018-09-07 DIAGNOSIS — D510 Vitamin B12 deficiency anemia due to intrinsic factor deficiency: Secondary | ICD-10-CM | POA: Diagnosis present

## 2018-09-07 LAB — CBC WITH DIFFERENTIAL/PLATELET
Abs Immature Granulocytes: 0.15 10*3/uL — ABNORMAL HIGH (ref 0.00–0.07)
Basophils Absolute: 0 10*3/uL (ref 0.0–0.1)
Basophils Relative: 0 %
Eosinophils Absolute: 0 10*3/uL (ref 0.0–0.5)
Eosinophils Relative: 0 %
HCT: 41.5 % (ref 39.0–52.0)
Hemoglobin: 12.3 g/dL — ABNORMAL LOW (ref 13.0–17.0)
Immature Granulocytes: 1 %
Lymphocytes Relative: 3 %
Lymphs Abs: 0.4 10*3/uL — ABNORMAL LOW (ref 0.7–4.0)
MCH: 28.8 pg (ref 26.0–34.0)
MCHC: 29.6 g/dL — ABNORMAL LOW (ref 30.0–36.0)
MCV: 97.2 fL (ref 80.0–100.0)
Monocytes Absolute: 1.3 10*3/uL — ABNORMAL HIGH (ref 0.1–1.0)
Monocytes Relative: 9 %
NEUTROS PCT: 87 %
Neutro Abs: 12.2 10*3/uL — ABNORMAL HIGH (ref 1.7–7.7)
Platelets: 74 10*3/uL — ABNORMAL LOW (ref 150–400)
RBC: 4.27 MIL/uL (ref 4.22–5.81)
RDW: 19.5 % — AB (ref 11.5–15.5)
WBC: 14.1 10*3/uL — ABNORMAL HIGH (ref 4.0–10.5)
nRBC: 0 % (ref 0.0–0.2)

## 2018-09-07 LAB — COMPREHENSIVE METABOLIC PANEL
ALT: 81 U/L — ABNORMAL HIGH (ref 0–44)
ANION GAP: 14 (ref 5–15)
AST: 68 U/L — ABNORMAL HIGH (ref 15–41)
Albumin: 4 g/dL (ref 3.5–5.0)
Alkaline Phosphatase: 83 U/L (ref 38–126)
BUN: 39 mg/dL — ABNORMAL HIGH (ref 8–23)
CO2: 22 mmol/L (ref 22–32)
Calcium: 9.9 mg/dL (ref 8.9–10.3)
Chloride: 101 mmol/L (ref 98–111)
Creatinine, Ser: 2 mg/dL — ABNORMAL HIGH (ref 0.61–1.24)
GFR calc Af Amer: 36 mL/min — ABNORMAL LOW (ref >60)
GFR calc non Af Amer: 31 mL/min — ABNORMAL LOW (ref >60)
Glucose, Bld: 170 mg/dL — ABNORMAL HIGH (ref 70–99)
POTASSIUM: 4.5 mmol/L (ref 3.5–5.1)
Sodium: 137 mmol/L (ref 135–145)
Total Bilirubin: 1.6 mg/dL — ABNORMAL HIGH (ref 0.3–1.2)
Total Protein: 7.6 g/dL (ref 6.5–8.1)

## 2018-09-07 LAB — URINALYSIS, ROUTINE W REFLEX MICROSCOPIC
BILIRUBIN URINE: NEGATIVE
Bacteria, UA: NONE SEEN
Glucose, UA: NEGATIVE mg/dL
Hgb urine dipstick: NEGATIVE
Ketones, ur: NEGATIVE mg/dL
Leukocytes, UA: NEGATIVE
Nitrite: NEGATIVE
Protein, ur: 30 mg/dL — AB
Specific Gravity, Urine: 1.016 (ref 1.005–1.030)
pH: 5 (ref 5.0–8.0)

## 2018-09-07 LAB — I-STAT CG4 LACTIC ACID, ED
Lactic Acid, Venous: 3.35 mmol/L (ref 0.5–1.9)
Lactic Acid, Venous: 1.82 mmol/L (ref 0.5–1.9)

## 2018-09-07 LAB — LIPASE, BLOOD: Lipase: 33 U/L (ref 11–51)

## 2018-09-07 LAB — CBG MONITORING, ED: Glucose-Capillary: 159 mg/dL — ABNORMAL HIGH (ref 70–99)

## 2018-09-07 LAB — LACTIC ACID, PLASMA: Lactic Acid, Venous: 1.8 mmol/L (ref 0.5–1.9)

## 2018-09-07 LAB — TYPE AND SCREEN
ABO/RH(D): AB POS
Antibody Screen: NEGATIVE

## 2018-09-07 LAB — I-STAT TROPONIN, ED: Troponin i, poc: 0.11 ng/mL (ref 0.00–0.08)

## 2018-09-07 LAB — INFLUENZA PANEL BY PCR (TYPE A & B)
Influenza A By PCR: NEGATIVE
Influenza B By PCR: NEGATIVE

## 2018-09-07 LAB — MAGNESIUM: Magnesium: 2.1 mg/dL (ref 1.7–2.4)

## 2018-09-07 LAB — PHOSPHORUS: Phosphorus: 2.2 mg/dL — ABNORMAL LOW (ref 2.5–4.6)

## 2018-09-07 MED ORDER — PRAVASTATIN SODIUM 40 MG PO TABS
80.0000 mg | ORAL_TABLET | Freq: Every day | ORAL | Status: DC
  Administered 2018-09-08 – 2018-09-09 (×3): 80 mg via ORAL
  Filled 2018-09-07 (×3): qty 2

## 2018-09-07 MED ORDER — SODIUM CHLORIDE 0.9 % IV BOLUS (SEPSIS): 1000.0000 mL | Freq: Once | INTRAVENOUS | Status: DC

## 2018-09-07 MED ORDER — ACETAMINOPHEN 650 MG RE SUPP: 650.0000 mg | Freq: Four times a day (QID) | RECTAL | Status: DC | PRN

## 2018-09-07 MED ORDER — VITAMIN D 25 MCG (1000 UNIT) PO TABS
2000.0000 [IU] | ORAL_TABLET | Freq: Every day | ORAL | Status: DC
  Administered 2018-09-07 – 2018-09-09 (×3): 2000 [IU] via ORAL
  Filled 2018-09-07 (×3): qty 2

## 2018-09-07 MED ORDER — SENNOSIDES-DOCUSATE SODIUM 8.6-50 MG PO TABS: 1.0000 | ORAL_TABLET | Freq: Every evening | ORAL | Status: DC | PRN

## 2018-09-07 MED ORDER — IPRATROPIUM BROMIDE 0.02 % IN SOLN: 0.5000 mg | Freq: Four times a day (QID) | RESPIRATORY_TRACT | Status: DC | PRN

## 2018-09-07 MED ORDER — INSULIN REGULAR HUMAN (CONC) 500 UNIT/ML ~~LOC~~ SOLN
100.0000 [IU] | Freq: Every day | SUBCUTANEOUS | Status: DC
  Administered 2018-09-08: 100 [IU] via SUBCUTANEOUS
  Filled 2018-09-07: qty 20

## 2018-09-07 MED ORDER — SODIUM CHLORIDE 0.9 % IV SOLN
INTRAVENOUS | Status: DC
  Administered 2018-09-07 – 2018-09-08 (×2): via INTRAVENOUS

## 2018-09-07 MED ORDER — FENOFIBRATE 54 MG PO TABS
54.0000 mg | ORAL_TABLET | Freq: Every day | ORAL | Status: DC
  Administered 2018-09-07 – 2018-09-09 (×3): 54 mg via ORAL
  Filled 2018-09-07 (×3): qty 1

## 2018-09-07 MED ORDER — MAGNESIUM CITRATE PO SOLN: 1.0000 | Freq: Once | ORAL | Status: DC | PRN

## 2018-09-07 MED ORDER — ONDANSETRON HCL 4 MG/2ML IJ SOLN
4.0000 mg | Freq: Four times a day (QID) | INTRAMUSCULAR | Status: DC | PRN
  Administered 2018-09-08: 4 mg via INTRAVENOUS
  Filled 2018-09-07: qty 2

## 2018-09-07 MED ORDER — RIVAROXABAN 20 MG PO TABS
20.0000 mg | ORAL_TABLET | Freq: Every evening | ORAL | Status: DC
  Administered 2018-09-07 – 2018-09-09 (×3): 20 mg via ORAL
  Filled 2018-09-07 (×3): qty 1

## 2018-09-07 MED ORDER — SODIUM CHLORIDE 0.9 % IV SOLN: 2.0000 g | INTRAVENOUS | Status: DC

## 2018-09-07 MED ORDER — INSULIN ASPART 100 UNIT/ML ~~LOC~~ SOLN
0.0000 [IU] | Freq: Three times a day (TID) | SUBCUTANEOUS | Status: DC
  Administered 2018-09-08: 1 [IU] via SUBCUTANEOUS
  Administered 2018-09-08: 2 [IU] via SUBCUTANEOUS
  Administered 2018-09-09: 7 [IU] via SUBCUTANEOUS

## 2018-09-07 MED ORDER — SODIUM CHLORIDE 0.9 % IV BOLUS
1000.0000 mL | Freq: Once | INTRAVENOUS | Status: AC
  Administered 2018-09-07: 1000 mL via INTRAVENOUS

## 2018-09-07 MED ORDER — INSULIN REGULAR HUMAN (CONC) 500 UNIT/ML ~~LOC~~ SOLN
140.0000 [IU] | Freq: Every day | SUBCUTANEOUS | Status: DC
  Administered 2018-09-08 – 2018-09-09 (×2): 140 [IU] via SUBCUTANEOUS
  Filled 2018-09-07 (×2): qty 20

## 2018-09-07 MED ORDER — PRAVASTATIN SODIUM 20 MG PO TABS: 80.0000 mg | ORAL_TABLET | Freq: Every day | ORAL | Status: DC

## 2018-09-07 MED ORDER — OXYCODONE HCL 5 MG PO TABS: 5.0000 mg | ORAL_TABLET | ORAL | Status: DC | PRN

## 2018-09-07 MED ORDER — NITROGLYCERIN 0.4 MG SL SUBL: 0.4000 mg | SUBLINGUAL_TABLET | SUBLINGUAL | Status: DC | PRN

## 2018-09-07 MED ORDER — SODIUM CHLORIDE 0.9 % IV SOLN
2.0000 g | INTRAVENOUS | Status: DC
  Administered 2018-09-07: 2 g via INTRAVENOUS
  Filled 2018-09-07: qty 20

## 2018-09-07 MED ORDER — INSULIN ASPART 100 UNIT/ML ~~LOC~~ SOLN: 0.0000 [IU] | Freq: Every day | SUBCUTANEOUS | Status: DC

## 2018-09-07 MED ORDER — CARVEDILOL 6.25 MG PO TABS
6.2500 mg | ORAL_TABLET | Freq: Two times a day (BID) | ORAL | Status: DC
  Administered 2018-09-07 – 2018-09-08 (×2): 6.25 mg via ORAL
  Filled 2018-09-07 (×3): qty 1

## 2018-09-07 MED ORDER — EZETIMIBE 10 MG PO TABS
10.0000 mg | ORAL_TABLET | Freq: Every day | ORAL | Status: DC
  Administered 2018-09-07 – 2018-09-09 (×3): 10 mg via ORAL
  Filled 2018-09-07 (×3): qty 1

## 2018-09-07 MED ORDER — RISAQUAD PO CAPS
1.0000 | ORAL_CAPSULE | Freq: Every day | ORAL | Status: DC
  Administered 2018-09-07 – 2018-09-09 (×3): 1 via ORAL
  Filled 2018-09-07 (×3): qty 1

## 2018-09-07 MED ORDER — ACETAMINOPHEN 325 MG PO TABS: 650.0000 mg | ORAL_TABLET | Freq: Four times a day (QID) | ORAL | Status: DC | PRN

## 2018-09-07 MED ORDER — OMEGA-3-ACID ETHYL ESTERS 1 G PO CAPS
2.0000 g | ORAL_CAPSULE | Freq: Two times a day (BID) | ORAL | Status: DC
  Administered 2018-09-07 – 2018-09-09 (×4): 2 g via ORAL
  Filled 2018-09-07 (×4): qty 2

## 2018-09-07 MED ORDER — FUROSEMIDE 40 MG PO TABS
80.0000 mg | ORAL_TABLET | Freq: Every day | ORAL | Status: DC
  Administered 2018-09-07: 80 mg via ORAL
  Filled 2018-09-07: qty 2

## 2018-09-07 MED ORDER — BISACODYL 5 MG PO TBEC: 5.0000 mg | DELAYED_RELEASE_TABLET | Freq: Every day | ORAL | Status: DC | PRN

## 2018-09-07 MED ORDER — SODIUM CHLORIDE 0.9 % IV BOLUS (SEPSIS)
400.0000 mL | Freq: Once | INTRAVENOUS | Status: AC
  Administered 2018-09-07: 400 mL via INTRAVENOUS

## 2018-09-07 MED ORDER — SPIRONOLACTONE 25 MG PO TABS
25.0000 mg | ORAL_TABLET | Freq: Every day | ORAL | Status: DC
  Administered 2018-09-07: 25 mg via ORAL
  Filled 2018-09-07: qty 1

## 2018-09-07 MED ORDER — SODIUM CHLORIDE 0.9 % IV BOLUS (SEPSIS)
1000.0000 mL | Freq: Once | INTRAVENOUS | Status: AC
  Administered 2018-09-07: 1000 mL via INTRAVENOUS

## 2018-09-07 MED ORDER — ONDANSETRON HCL 4 MG PO TABS: 4.0000 mg | ORAL_TABLET | Freq: Four times a day (QID) | ORAL | Status: DC | PRN

## 2018-09-07 MED ORDER — POTASSIUM CHLORIDE CRYS ER 10 MEQ PO TBCR
20.0000 meq | EXTENDED_RELEASE_TABLET | Freq: Two times a day (BID) | ORAL | Status: DC
  Administered 2018-09-07 – 2018-09-09 (×4): 20 meq via ORAL
  Filled 2018-09-07 (×4): qty 2

## 2018-09-07 MED ORDER — ALBUTEROL SULFATE (2.5 MG/3ML) 0.083% IN NEBU: 2.5000 mg | INHALATION_SOLUTION | Freq: Four times a day (QID) | RESPIRATORY_TRACT | Status: DC | PRN

## 2018-09-07 NOTE — H&P (Addendum)
History and Physical   [facility] [place] @ [place] Admission History and Physical [employer], D.O.    Patient Name: Robert Gregory MR#: [id number] Date of Birth: 20-Jun-1941 Date of Admission: 09/07/2018  Referring MD/NP/PA: Dr. Melina Copa Primary Care Physician: Seward Carol, MD  Chief Complaint:  Chief Complaint  Patient presents with  . Weakness    HPI: Robert Gregory is a 77 y.o. male with a known history of atrial fibrillation on Xarelto, diabetes, coronary artery disease with AICD, congestive heart failure, hypertension, hyperlipidemia, chronic iron deficiency anemia/pernicious anemia obstructive sleep apnea presents to the emergency department for evaluation of multiple complaints including weakness, nausea, vomiting, fevers to 100.2 today,  shortness of breath. He also reports decreased PO intake. Patient was in a usual state of health until the past month or so when he reports worsening weakness and fatigue.  His hematologist recently started him on iron infusions and B12 injections with minimal improvement in his symptoms.  He came to the ER today because of worsening fatigue over the past 24 hours.  His wife noticed the onset of redness of his right lower extremity this morning. .   Otherwise there has been no change in status.   EMS/ED Course: Patient received Rocephin, normal saline. Medical admission has been requested for further management of sepsis secondary to right lower extremity cellulitis.  Review of Systems:  CONSTITUTIONAL: Positive fever/chills, fatigue, weakness, negative weight gain/loss, headache. EYES: No blurry or double vision. ENT: No tinnitus, postnasal drip, redness or soreness of the oropharynx. RESPIRATORY: Positive dry cough, dyspnea.  No wheezing CARDIOVASCULAR: No chest pain, palpitations, syncope, orthopnea. No lower extremity edema.  GASTROINTESTINAL: Positive nausea, vomiting.  Negative abdominal pain, diarrhea, constipation.   No hematemesis, melena or hematochezia. GENITOURINARY: No dysuria, frequency, hematuria. ENDOCRINE: No polyuria or nocturia. No heat or cold intolerance. HEMATOLOGY: No anemia, bruising, bleeding. INTEGUMENTARY: No rashes, ulcers, lesions. MUSCULOSKELETAL: No arthritis, gout. NEUROLOGIC: No numbness, tingling, ataxia, seizure-type activity, weakness. PSYCHIATRIC: No anxiety, depression, insomnia.   Past Medical History:  Diagnosis Date  . Arthritis   . Atrial fibrillation ([place])    takes Xarelto daily  . CAD (coronary artery disease)   . Cardiomyopathy, ischemic 11/07/2013  . Cholelithiasis   . Chronic back pain    scoliosis/arthritis  . Complication of anesthesia    forgetful for about 2-3wks after anesthesia  . Congestive heart failure ([place])    takes Spironolactone daily  . Diabetes mellitus ([place])    takes Invokamet daily as well as Hum U  . History of colon polyps   . History of kidney stones   . Hyperlipidemia    takes [employer] daily  . Hyperlipidemia 11/07/2013  . Hypertension    takes Benicar and Coreg daily  . ICD (implantable cardioverter-defibrillator) battery depletion   . ICD (implantable cardioverter-defibrillator) in place 11/07/2013  . Iron deficiency anemia due to chronic blood loss 08/24/2018  . Iron malabsorption 08/24/2018  . Ischemic cardiomyopathy   . Ischemic cardiomyopathy   . Joint pain   . Myocardial infarction ([place]) 1997   on the OR table   . Nephrolithiasis   . Obstructive sleep apnea   . Peripheral neuropathy   . Pernicious anemia 08/24/2018  . Pneumonia    as a child  . Spinal stenosis   . Thrombocytopenia ([place])   . Total knee replacement status 11/29/2014  . Type 2 diabetes mellitus with stage 3 chronic kidney disease ([place]) 05/19/2015  . Urinary frequency   .  Urinary urgency     Past Surgical History:  Procedure Laterality Date  . CARDIAC CATHETERIZATION N/A 10/02/2015   Procedure: Left Heart Cath and Cors/Grafts  Angiography;  Surgeon: Belva Crome, MD;  Location: [facility];  Service: Cardiovascular;  Laterality: N/A;  . COLONOSCOPY    . coronary artery bypass and graft  1997   x 5  . ESOPHAGOGASTRODUODENOSCOPY    . ICD placed    . LAMINECTOMY    . LITHOTRIPSY    . Right rotator cuff surgery    . TONSILLECTOMY     adenoids  . TOTAL KNEE ARTHROPLASTY Left   . TOTAL KNEE ARTHROPLASTY Right 11/29/2014   Procedure: RIGHT TOTAL KNEE ARTHROPLASTY;  Surgeon: Leandrew Koyanagi, MD;  Location: [facility];  Service: Orthopedics;  Laterality: Right;     reports that he has never smoked. He has never used smokeless tobacco. He reports current alcohol use. He reports that he does not use drugs.  Allergies  Allergen Reactions  . Iodine     flushing  . Seasonal Ic [Cholestatin]   . Tape Other (See Comments)    Rash  Rash   . Contrast Media [Iodinated Diagnostic Agents] Rash and Other (See Comments)    flushing EXTREME FLUSHING   . Ioxaglate Other (See Comments) and Rash    flushing EXTREME FLUSHING     Family History  Problem Relation Age of Onset  . Heart disease Other        No family history    Prior to Admission medications   Medication Sig Start Date End Date Taking? Authorizing Provider  BENICAR 40 MG tablet TAKE 1 TABLET TWICE A DAY 03/13/18  Yes Lelon Perla, MD  carvedilol (COREG) 6.25 MG tablet Take 1 tablet (6.25 mg total) by mouth 2 (two) times daily with a meal. 05/31/18  Yes Crenshaw, Denice Bors, MD  Cholecalciferol (VITAMIN D) 50 MCG (2000 UT) tablet Take 2,000 Units by mouth daily.   Yes [provider]  Coenzyme Q10 200 MG TABS Take 1 tablet by mouth daily.   Yes [provider]  Cranberry 200 MG CAPS Take 200 mg by mouth 2 (two) times a week. Take on Tues & Thurs   Yes [provider]  ezetimibe (ZETIA) 10 MG tablet TAKE 1 TABLET DAILY Patient taking differently: Take 10 mg by mouth daily.  07/11/18  Yes Lelon Perla, MD  furosemide (LASIX) 40  MG tablet TAKE 1 TABLET DAILY Patient taking differently: Take 80 mg by mouth daily.  03/13/18  Yes Lelon Perla, MD  insulin regular human CONCENTRATED (HUMULIN R) 500 UNIT/ML injection Inject up to 200 units a day as advised Patient taking differently: Inject 100-140 Units into the skin 2 (two) times daily with a meal. Take 140 units at breakfast and Take 100 units at lunch. 06/29/18  Yes Philemon Kingdom, MD  KLOR-CON 10 10 MEQ tablet TAKE 2 TABLETS (20 MEQ TOTAL) TWICE A DAY Patient taking differently: Take 20 mEq by mouth 2 (two) times daily.  01/31/18  Yes Lelon Perla, MD  metFORMIN (GLUCOPHAGE-XR) 500 MG 24 hr tablet TAKE 4 TABLETS (2000 MG) WITH DINNER Patient taking differently: Take 2,000 mg by mouth every evening. TAKE 4 TABLETS (2000 MG) WITH DINNER 05/29/18  Yes Philemon Kingdom, MD  Multiple Vitamins-Minerals (CENTRUM SILVER PO) Take 1 tablet by mouth daily.   Yes [provider]  NON FORMULARY Take 1 tablet by mouth daily. Methyfolate 2000 MCG daily  Yes [provider]  omega-3 acid ethyl esters (LOVAZA) 1 g capsule TAKE 2 CAPSULES TWICE A DAY Patient taking differently: Take 2 g by mouth 2 (two) times daily. TAKE 2 CAPSULES TWICE A DAY 04/04/18  Yes Crenshaw, Denice Bors, MD  Peppermint Oil (IBGARD) 90 MG CPCR Take 90 mg by mouth 2 (two) times daily.   Yes [provider]  pravastatin (PRAVACHOL) 80 MG tablet TAKE 1 TABLET DAILY Patient taking differently: Take 80 mg by mouth daily.  03/13/18  Yes Lelon Perla, MD  Probiotic Product (PROBIOTIC DAILY PO) Take 1 tablet by mouth daily.   Yes [provider]  spironolactone (ALDACTONE) 25 MG tablet TAKE 1 TABLET DAILY Patient taking differently: Take 25 mg by mouth daily.  03/13/18  Yes Crenshaw, Denice Bors, MD  TRICOR 48 MG tablet TAKE 1 TABLET DAILY Patient taking differently: Take 48 mg by mouth daily.  07/11/18  Yes Crenshaw, Denice Bors, MD  XARELTO 20 MG TABS tablet TAKE 1 TABLET DAILY WITH  SUPPER Patient taking differently: Take 20 mg by mouth every evening.  03/13/18  Yes Crenshaw, Denice Bors, MD  amoxicillin (AMOXIL) 500 MG capsule Take 500 mg by mouth as directed. 6 prior to dental work    [provider]  BD INSULIN SYRINGE U-500 31G X 6MM 0.5 ML MISC USE 1 SYRINGE TO INJECT UNDER THE SKIN THREE TIMES A DAY BEFORE MEALS 11/11/17   Philemon Kingdom, MD  nitroGLYCERIN (NITROSTAT) 0.4 MG SL tablet Place 1 tablet (0.4 mg total) under the tongue every 5 (five) minutes as needed for chest pain. 07/03/18   Lelon Perla, MD    Physical Exam: Vitals:   09/07/18 1601 09/07/18 1610 09/07/18 1611  BP:  (!) 96/49   Pulse:  99   Resp:  19   Temp:  98.8 F (37.1 C)   TempSrc:  Oral   SpO2: 98% 95%   Weight:   (!) 140.6 kg  Height:   6' 1"  (1.854 m)    GENERAL: 77 y.o.-year-old white male patient, well-developed, well-nourished lying in the bed in no acute distress.  Pleasant and cooperative.   HEENT: Head atraumatic, normocephalic. Pupils equal. Mucus membranes dry NECK: Supple. No JVD. CHEST: Normal breath sounds bilaterally. No wheezing, rales, rhonchi or crackles. No use of accessory muscles of respiration.  No reproducible chest wall tenderness.  CARDIOVASCULAR: S1, S2 normal. No murmurs, rubs, or gallops. Cap refill <2 seconds. Pulses intact distally.  ABDOMEN: Soft, nondistended, nontender. No rebound, guarding, rigidity. Normoactive bowel sounds present in all four quadrants.  EXTREMITIES: Positive bilateral lower extremity edema, nonpitting.  Chronic venous stasis changes. Significant redness affecting the right lower leg to medial right ankle in a band-like configuration. Mild calf tenderness. No Homan's sign.  NEUROLOGIC: The patient is alert and oriented x 3. Cranial nerves II through XII are grossly intact with no focal sensorimotor deficit. PSYCHIATRIC:  Normal affect, mood, thought content. SKIN: Warm, dry, and intact without obvious rash, lesion, or  ulcer.    Labs on Admission:  CBC: Recent Labs  Lab 09/07/18 1730  WBC 14.1*  NEUTROABS 12.2*  HGB 12.3*  HCT 41.5  MCV 97.2  PLT 74*   Basic Metabolic Panel: Recent Labs  Lab 09/07/18 1730  NA 137  K 4.5  CL 101  CO2 22  GLUCOSE 170*  BUN 39*  CREATININE 2.00*  CALCIUM 9.9   GFR: Estimated Creatinine Clearance: 45.6 mL/min (A) (by C-G formula based on SCr of  2 mg/dL (H)). Liver Function Tests: Recent Labs  Lab 09/07/18 1730  AST 68*  ALT 81*  ALKPHOS 83  BILITOT 1.6*  PROT 7.6  ALBUMIN 4.0   Recent Labs  Lab 09/07/18 1730  LIPASE 33   No results for input(s): AMMONIA in the last 168 hours. Coagulation Profile: No results for input(s): INR, PROTIME in the last 168 hours. Cardiac Enzymes: No results for input(s): CKTOTAL, CKMB, CKMBINDEX, TROPONINI in the last 168 hours. BNP (last 3 results) No results for input(s): PROBNP in the last 8760 hours. HbA1C: No results for input(s): HGBA1C in the last 72 hours. CBG: No results for input(s): GLUCAP in the last 168 hours. Lipid Profile: No results for input(s): CHOL, HDL, LDLCALC, TRIG, CHOLHDL, LDLDIRECT in the last 72 hours. Thyroid Function Tests: No results for input(s): TSH, T4TOTAL, FREET4, T3FREE, THYROIDAB in the last 72 hours. Anemia Panel: No results for input(s): VITAMINB12, FOLATE, FERRITIN, TIBC, IRON, RETICCTPCT in the last 72 hours. Urine analysis:    Component Value Date/Time   COLORURINE AMBER (A) 09/07/2018 1815   APPEARANCEUR HAZY (A) 09/07/2018 1815   LABSPEC 1.016 09/07/2018 1815   PHURINE 5.0 09/07/2018 1815   GLUCOSEU NEGATIVE 09/07/2018 1815   HGBUR NEGATIVE 09/07/2018 1815   BILIRUBINUR NEGATIVE 09/07/2018 1815   BILIRUBINUR negative 11/07/2017 [place] 09/07/2018 1815   PROTEINUR 30 (A) 09/07/2018 1815   UROBILINOGEN 0.2 11/07/2017 1437   UROBILINOGEN 0.2 11/19/2014 1445   NITRITE NEGATIVE 09/07/2018 1815   LEUKOCYTESUR NEGATIVE 09/07/2018 1815    Sepsis Labs: @LABRCNTIP (procalcitonin:4,lacticidven:4) )No results found for this or any previous visit (from the past 240 hour(s)).   Radiological Exams on Admission: Dg Chest Port 1 View  Result Date: 09/07/2018 CLINICAL DATA:  Weakness EXAM: PORTABLE CHEST 1 VIEW COMPARISON:  08/31/2016 FINDINGS: Post sternotomy changes. Left-sided pacing device as before. Borderline cardiomegaly. No focal airspace disease. Aortic atherosclerosis. No pneumothorax. IMPRESSION: Borderline to mild cardiomegaly with slight central congestion. Negative for pleural effusion or focal airspace disease Electronically Signed   By: Donavan Foil M.D.   On: 09/07/2018 17:07    EKG: Normal sinus rhythm at 89 bpm with prolonged PR interval, right bundle branch block, inferior Q waves nonspecific ST-T wave changes.   Assessment/Plan  This is a 77 y.o. male with a history of atrial fibrillation on Xarelto, diabetes, coronary artery disease with AICD, congestive heart failure, hypertension, hyperlipidemia, chronic iron deficiency anemia/pernicious anemia obstructive sleep apnea now being admitted with:  #. Sepsis secondary to RLE cellulitis - Admit to inpatient with telemetry monitoring - IV antibiotics: Rocephin - IV fluid hydration - Follow up blood,urine & sputum cultures - Repeat CBC in am.  - Check doppler of RLE to rule out DVT  #. Acute kidney injury  - IV fluids and repeat BMP in AM.  - Avoid nephrotoxic medications - Bladder scan and place foley catheter if evidence of urinary retention  #. Positive troponin in the setting of renal insufficiency, no chest pain and nonischemic EKG - Unlikely ACS - Trend trops - Consider cardio consult for AM  #.  History of atrial fibrillation -Continue Xarelto, Coreg  #. History of hypertension / CHF - Continue Lasix, potassium Coreg, spironolactone  #. History of hyperlipidemia - Continue Zetia, pravastatin, TriCor  #. History of diabetes - Accu-Cheks  before meals and at bedtime - Continue regular insulin -Hold metformin  #.  History of iron deficiency and pernicious anemia with thrombocytopenia Monitor CBC   Admission status: Inpatient  IV Fluids: Normal saline Diet/Nutrition: Heart healthy, carb controlled Consults called: None DVT Px: Xarelto and early ambulation. Code Status: Full Code  Disposition Plan: To home in 1-2 days  All the records are reviewed and case discussed with ED provider. Management plans discussed with the patient and/or family who express understanding and agree with plan of care.  Harvie Bridge D.O. on 09/07/2018 at 6:38 PM CC: Primary care physician; Seward Carol, MD   09/07/2018, 6:38 PM

## 2018-09-07 NOTE — ED Notes (Signed)
Unable to obtain second set of cultures.  RN and tech attempt

## 2018-09-07 NOTE — ED Notes (Signed)
Bed: WA01 Expected date:  Expected time:  Means of arrival:  Comments: EMS-weak

## 2018-09-07 NOTE — Telephone Encounter (Signed)
Patient is feeling poorly. He states he feels weak and tired. He has what he states is a low grade fever of 98.6. (patient states his norm is 97). He also c/o tremors. He had his last B12 and Iron infusion on Tuesday. Patient's wife suggests a possible reaction to this treatment.  Explained that these symptoms are not likely related to iron or B12 since he's had iron before without problem and we're now two days post infusion. Suggested possibility of illness since the community has a higher level or URI and flu at this time. Also educated the patient that weakness and 'feeling better' from the iron may take 4-6 weeks for full therapeutic impact.   Spoke with Dr Marin Olp and he would like the patient to follow up with primary care.   Instructed patient's wife to make sure the patient has frequent rest periods, monitors his blood sugar carefully, and increases his fluids. Also suggested if he continues to feel badly to reach out to his PCP for an appointment. She understands.

## 2018-09-07 NOTE — ED Triage Notes (Signed)
Per EMS: Pt from home with c/o of generalized weakness (x5 days) and low-grade fevers (x2 days).  Pt c/o of SOB with exertion.  No N/V/D, no congestion. Pt hx DM. Redness on R lower calf, normothermic to touch.

## 2018-09-07 NOTE — ED Notes (Signed)
ED Provider at bedside. 

## 2018-09-07 NOTE — ED Provider Notes (Signed)
[facility] DEPT Provider Note   CSN: [id number] Arrival date & time: 09/07/18  1554     History   Chief Complaint Chief Complaint  Patient presents with  . Weakness    HPI Robert Gregory is a 77 y.o. male.  He has multiple medical problems including A. fib on anticoagulation coronary disease diabetes and anemia and thrombocytopenia.  His hematologist has recently started him on some vitamin and iron infusions.  He is complaining of feeling weak all over with intermittent confusion unsteady gait low-grade fever dry cough nausea with 3 episodes of vomiting.  No chest pain.  He had a low-grade fever of 100.2 today.  He feels a little short of breath with exertion but also just feels generally weak.  Vomit was nonbloody.  No diarrhea or constipation or urinary symptoms.  New redness to his right lower extremity.  The history is provided by the patient.  Weakness  Primary symptoms include no focal weakness. This is a new problem. The current episode started yesterday. The problem has not changed since onset.There was no focality noted. The maximum temperature recorded prior to his arrival was 100 to 100.9 F. The fever has been present for less than 1 day. Associated symptoms include shortness of breath, vomiting, altered mental status and confusion. Pertinent negatives include no chest pain and no headaches. There were no medications administered prior to arrival.    Past Medical History:  Diagnosis Date  . Arthritis   . Atrial fibrillation ([place])    takes Xarelto daily  . CAD (coronary artery disease)   . Cardiomyopathy, ischemic 11/07/2013  . Cholelithiasis   . Chronic back pain    scoliosis/arthritis  . Complication of anesthesia    forgetful for about 2-3wks after anesthesia  . Congestive heart failure ([place])    takes Spironolactone daily  . Diabetes mellitus ([place])    takes Invokamet daily as well as Hum U  . History of colon polyps   . History of  kidney stones   . Hyperlipidemia    takes [employer] daily  . Hyperlipidemia 11/07/2013  . Hypertension    takes Benicar and Coreg daily  . ICD (implantable cardioverter-defibrillator) battery depletion   . ICD (implantable cardioverter-defibrillator) in place 11/07/2013  . Iron deficiency anemia due to chronic blood loss 08/24/2018  . Iron malabsorption 08/24/2018  . Ischemic cardiomyopathy   . Ischemic cardiomyopathy   . Joint pain   . Myocardial infarction ([place]) 1997   on the OR table   . Nephrolithiasis   . Obstructive sleep apnea   . Peripheral neuropathy   . Pernicious anemia 08/24/2018  . Pneumonia    as a child  . Spinal stenosis   . Thrombocytopenia ([place])   . Total knee replacement status 11/29/2014  . Type 2 diabetes mellitus with stage 3 chronic kidney disease ([place]) 05/19/2015  . Urinary frequency   . Urinary urgency     Patient Active Problem List   Diagnosis Date Noted  . Iron deficiency anemia due to chronic blood loss 08/24/2018  . Iron malabsorption 08/24/2018  . Pernicious anemia 08/24/2018  . Obesity, Class III, BMI 40-49.9 (morbid obesity) ([place]) 05/06/2017  . Splenomegaly, not elsewhere classified 10/27/2016  . Renal mass, left 10/27/2016  . Abnormal nuclear stress test   . Obstructive sleep apnea   . Nephrolithiasis   . Thrombocytopenia ([place])   . Cholelithiasis   . ICD (implantable cardioverter-defibrillator) battery depletion   . Ischemic cardiomyopathy   .  Hypertension   . Myocardial infarction ([place])   . Pneumonia   . Peripheral neuropathy   . Joint pain   . Chronic back pain   . History of colon polyps   . Urinary frequency   . History of kidney stones   . Type 2 diabetes mellitus with stage 3 chronic kidney disease ([place]) 05/19/2015  . H/O total knee replacement, right 11/29/2014  . Pre-operative cardiovascular examination 11/20/2014  . CAD (coronary artery disease) 11/07/2013  . Atrial fibrillation ([place]) 11/07/2013  . ICD  (implantable cardioverter-defibrillator) in place 11/07/2013  . Essential hypertension 11/07/2013  . Hyperlipidemia 11/07/2013  . Cardiomyopathy, ischemic 11/07/2013    Past Surgical History:  Procedure Laterality Date  . CARDIAC CATHETERIZATION N/A 10/02/2015   Procedure: Left Heart Cath and Cors/Grafts Angiography;  Surgeon: Belva Crome, MD;  Location: [facility];  Service: Cardiovascular;  Laterality: N/A;  . COLONOSCOPY    . coronary artery bypass and graft  1997   x 5  . ESOPHAGOGASTRODUODENOSCOPY    . ICD placed    . LAMINECTOMY    . LITHOTRIPSY    . Right rotator cuff surgery    . TONSILLECTOMY     adenoids  . TOTAL KNEE ARTHROPLASTY Left   . TOTAL KNEE ARTHROPLASTY Right 11/29/2014   Procedure: RIGHT TOTAL KNEE ARTHROPLASTY;  Surgeon: Leandrew Koyanagi, MD;  Location: [facility];  Service: Orthopedics;  Laterality: Right;        Home Medications    Prior to Admission medications   Medication Sig Start Date End Date Taking? Authorizing Provider  amoxicillin (AMOXIL) 500 MG capsule Take 500 mg by mouth as directed. 6 prior to dental work    [provider]  BD INSULIN SYRINGE U-500 31G X 6MM 0.5 ML MISC USE 1 SYRINGE TO INJECT UNDER THE SKIN THREE TIMES A DAY BEFORE MEALS 11/11/17   Philemon Kingdom, MD  BENICAR 40 MG tablet TAKE 1 TABLET TWICE A DAY 03/13/18   Lelon Perla, MD  carvedilol (COREG) 6.25 MG tablet Take 1 tablet (6.25 mg total) by mouth 2 (two) times daily with a meal. 05/31/18   Crenshaw, Denice Bors, MD  Coenzyme Q10 200 MG TABS Take 1 tablet by mouth daily.    [provider]  Cranberry 200 MG CAPS Take 200 mg by mouth daily.     [provider]  ezetimibe (ZETIA) 10 MG tablet TAKE 1 TABLET DAILY 07/11/18   Lelon Perla, MD  furosemide (LASIX) 40 MG tablet TAKE 1 TABLET DAILY 03/13/18   Lelon Perla, MD  insulin regular human CONCENTRATED (HUMULIN R) 500 UNIT/ML injection Inject up to 200 units a day as advised 06/29/18    Philemon Kingdom, MD  KLOR-CON 10 10 MEQ tablet TAKE 2 TABLETS (20 MEQ TOTAL) TWICE A DAY 01/31/18   Lelon Perla, MD  metFORMIN (GLUCOPHAGE-XR) 500 MG 24 hr tablet TAKE 4 TABLETS (2000 MG) WITH DINNER 05/29/18   Philemon Kingdom, MD  Multiple Vitamins-Minerals (CENTRUM SILVER PO) Take 1 tablet by mouth daily.    [provider]  nitroGLYCERIN (NITROSTAT) 0.4 MG SL tablet Place 1 tablet (0.4 mg total) under the tongue every 5 (five) minutes as needed for chest pain. 07/03/18   Lelon Perla, MD  NON FORMULARY Methyfolate 2000 MCG daily    [provider]  omega-3 acid ethyl esters (LOVAZA) 1 g capsule TAKE 2 CAPSULES TWICE A DAY 04/04/18   Lelon Perla, MD  pravastatin (  PRAVACHOL) 80 MG tablet TAKE 1 TABLET DAILY 03/13/18   Lelon Perla, MD  Probiotic Product (PROBIOTIC DAILY PO) Take 1 tablet by mouth daily.    [provider]  spironolactone (ALDACTONE) 25 MG tablet TAKE 1 TABLET DAILY 03/13/18   Lelon Perla, MD  TRICOR 48 MG tablet TAKE 1 TABLET DAILY 07/11/18   Lelon Perla, MD  XARELTO 20 MG TABS tablet TAKE 1 TABLET DAILY WITH SUPPER 03/13/18   Lelon Perla, MD    Family History Family History  Problem Relation Age of Onset  . Heart disease Other        No family history    Social History Social History   Tobacco Use  . Smoking status: Never Smoker  . Smokeless tobacco: Never Used  Substance Use Topics  . Alcohol use: Yes    Alcohol/week: 0.0 standard drinks    Comment: once drink a month  . Drug use: No     Allergies   Iodine; Seasonal ic [cholestatin]; Tape; Contrast media [iodinated diagnostic agents]; and Ioxaglate   Review of Systems Review of Systems  Constitutional: Positive for fatigue and fever. Negative for chills.  HENT: Negative for sore throat.   Eyes: Negative for pain.  Respiratory: Positive for cough and shortness of breath.   Cardiovascular: Negative for chest pain.  Gastrointestinal:  Positive for nausea and vomiting. Negative for abdominal pain.  Genitourinary: Negative for dysuria and frequency.  Musculoskeletal: Negative for neck pain.  Skin: Positive for rash.  Neurological: Positive for weakness and light-headedness. Negative for focal weakness, seizures, syncope and headaches.  Psychiatric/Behavioral: Positive for confusion.     Physical Exam Updated Vital Signs BP (!) 96/49   Pulse 99   Temp 98.8 F (37.1 C) (Oral)   Resp 19   Ht 6' 1"  (1.854 m)   Wt (!) 140.6 kg   SpO2 95%   BMI 40.90 kg/m   Physical Exam Vitals signs and nursing note reviewed.  Constitutional:      General: He is not in acute distress.    Appearance: He is well-developed.  HENT:     Head: Normocephalic and atraumatic.     Mouth/Throat:     Mouth: Mucous membranes are moist.     Pharynx: Oropharynx is clear.  Eyes:     Conjunctiva/sclera: Conjunctivae normal.  Neck:     Musculoskeletal: Neck supple.  Cardiovascular:     Rate and Rhythm: Normal rate and regular rhythm.     Pulses: Normal pulses.     Heart sounds: No murmur.  Pulmonary:     Effort: Pulmonary effort is normal. No respiratory distress.     Breath sounds: Normal breath sounds.  Abdominal:     Palpations: Abdomen is soft.     Tenderness: There is no abdominal tenderness.  Musculoskeletal:     Right lower leg: Edema present.     Left lower leg: Edema present.     Comments: There is erythema and warmth on the medial side of the right ankle to about halfway up lower leg.  Skin:    General: Skin is warm and dry.     Capillary Refill: Capillary refill takes less than 2 seconds.  Neurological:     General: No focal deficit present.     Mental Status: He is alert.  Psychiatric:        Mood and Affect: Mood normal.      ED Treatments / Results  Labs (all labs ordered are  listed, but only abnormal results are displayed) Labs Reviewed  COMPREHENSIVE METABOLIC PANEL - Abnormal; Notable for the following  components:      Result Value   Glucose, Bld 170 (*)    BUN 39 (*)    Creatinine, Ser 2.00 (*)    AST 68 (*)    ALT 81 (*)    Total Bilirubin 1.6 (*)    GFR calc non Af Amer 31 (*)    GFR calc Af Amer 36 (*)    All other components within normal limits  CBC WITH DIFFERENTIAL/PLATELET - Abnormal; Notable for the following components:   WBC 14.1 (*)    Hemoglobin 12.3 (*)    MCHC 29.6 (*)    RDW 19.5 (*)    Platelets 74 (*)    Neutro Abs 12.2 (*)    Lymphs Abs 0.4 (*)    Monocytes Absolute 1.3 (*)    Abs Immature Granulocytes 0.15 (*)    All other components within normal limits  URINALYSIS, ROUTINE W REFLEX MICROSCOPIC - Abnormal; Notable for the following components:   Color, Urine AMBER (*)    APPearance HAZY (*)    Protein, ur 30 (*)    All other components within normal limits  I-STAT TROPONIN, ED - Abnormal; Notable for the following components:   Troponin i, poc 0.11 (*)    All other components within normal limits  I-STAT CG4 LACTIC ACID, ED - Abnormal; Notable for the following components:   Lactic Acid, Venous 3.35 (*)    All other components within normal limits  CULTURE, BLOOD (ROUTINE X 2)  CULTURE, BLOOD (ROUTINE X 2)  LIPASE, BLOOD  INFLUENZA PANEL BY PCR (TYPE A & B)  I-STAT CG4 LACTIC ACID, ED  TYPE AND SCREEN  ABO/RH    EKG EKG Interpretation  Date/Time:  Thursday September 07 2018 17:41:35 EST Ventricular Rate:  89 PR Interval:    QRS Duration: 157 QT Interval:  386 QTC Calculation: 470 R Axis:   -25 Text Interpretation:  Sinus or ectopic atrial rhythm Prolonged PR interval Right bundle branch block Abnormal inferior Q waves similar to prior 1/17 Confirmed by Aletta Edouard [phone]) on 09/07/2018 5:52:57 PM   Radiology Dg Chest Port 1 View  Result Date: 09/07/2018 CLINICAL DATA:  Weakness EXAM: PORTABLE CHEST 1 VIEW COMPARISON:  08/31/2016 FINDINGS: Post sternotomy changes. Left-sided pacing device as before. Borderline cardiomegaly. No focal  airspace disease. Aortic atherosclerosis. No pneumothorax. IMPRESSION: Borderline to mild cardiomegaly with slight central congestion. Negative for pleural effusion or focal airspace disease Electronically Signed   By: Donavan Foil M.D.   On: 09/07/2018 17:07    Procedures .Critical Care Performed by: Hayden Rasmussen, MD Authorized by: Hayden Rasmussen, MD   Critical care provider statement:    Critical care time (minutes):  45   Critical care time was exclusive of:  Separately billable procedures and treating other patients   Critical care was necessary to treat or prevent imminent or life-threatening deterioration of the following conditions:  Sepsis   Critical care was time spent personally by me on the following activities:  Discussions with consultants, evaluation of patient's response to treatment, examination of patient, ordering and performing treatments and interventions, ordering and review of laboratory studies, ordering and review of radiographic studies, pulse oximetry, re-evaluation of patient's condition, obtaining history from patient or surrogate, review of old charts and development of treatment plan with patient or surrogate   I assumed direction of critical care for this  patient from another provider in my specialty: no     (including critical care time)  Medications Ordered in ED Medications  cefTRIAXone (ROCEPHIN) 2 g in sodium chloride 0.9 % 100 mL IVPB (0 g Intravenous Stopped 09/07/18 1827)  carvedilol (COREG) tablet 6.25 mg (6.25 mg Oral Given 09/07/18 2255)  ezetimibe (ZETIA) tablet 10 mg (10 mg Oral Given 09/07/18 2254)  furosemide (LASIX) tablet 80 mg (80 mg Oral Given 09/07/18 2253)  nitroGLYCERIN (NITROSTAT) SL tablet 0.4 mg (has no administration in time range)  omega-3 acid ethyl esters (LOVAZA) capsule 2 g (2 g Oral Given 09/07/18 2255)  spironolactone (ALDACTONE) tablet 25 mg (25 mg Oral Given 09/07/18 2253)  fenofibrate tablet 54 mg (54 mg Oral Given  09/07/18 2254)  insulin regular human CONCENTRATED (HUMULIN R) 500 UNIT/ML injection 140 Units (has no administration in time range)  rivaroxaban (XARELTO) tablet 20 mg (20 mg Oral Given 09/07/18 2253)  acidophilus (RISAQUAD) capsule 1 capsule (1 capsule Oral Given 09/07/18 2254)  cholecalciferol (VITAMIN D3) tablet 2,000 Units (2,000 Units Oral Given 09/07/18 2254)  potassium chloride (K-DUR,KLOR-CON) CR tablet 20 mEq (20 mEq Oral Given 09/07/18 2253)  0.9 %  sodium chloride infusion ( Intravenous New Bag/Given 09/07/18 2258)  acetaminophen (TYLENOL) tablet 650 mg (has no administration in time range)    Or  acetaminophen (TYLENOL) suppository 650 mg (has no administration in time range)  oxyCODONE (Oxy IR/ROXICODONE) immediate release tablet 5 mg (has no administration in time range)  senna-docusate (Senokot-S) tablet 1 tablet (has no administration in time range)  bisacodyl (DULCOLAX) EC tablet 5 mg (has no administration in time range)  magnesium citrate solution 1 Bottle (has no administration in time range)  ondansetron (ZOFRAN) tablet 4 mg (has no administration in time range)    Or  ondansetron (ZOFRAN) injection 4 mg (has no administration in time range)  albuterol (PROVENTIL) (2.5 MG/3ML) 0.083% nebulizer solution 2.5 mg (has no administration in time range)  ipratropium (ATROVENT) nebulizer solution 0.5 mg (has no administration in time range)  insulin aspart (novoLOG) injection 0-9 Units (has no administration in time range)  insulin aspart (novoLOG) injection 0-5 Units (0 Units Subcutaneous Not Given 09/07/18 2300)  insulin regular human CONCENTRATED (HUMULIN R) 500 UNIT/ML injection 100 Units (has no administration in time range)  pravastatin (PRAVACHOL) tablet 80 mg (has no administration in time range)  sodium chloride 0.9 % bolus 1,000 mL (0 mLs Intravenous Stopped 09/07/18 1841)  sodium chloride 0.9 % bolus 1,000 mL (0 mLs Intravenous Stopped 09/07/18 2013)    And  sodium  chloride 0.9 % bolus 400 mL (0 mLs Intravenous Stopped 09/07/18 1955)     Initial Impression / Assessment and Plan / ED Course  I have reviewed the triage vital signs and the nursing notes.  Pertinent labs & imaging results that were available during my care of the patient were reviewed by me and considered in my medical decision making (see chart for details).  Clinical Course as of Sep 07 1914  Thu Sep 07, 5832  5672 77 year old male with some nonspecific illness symptoms.  He attributes to recent infusions of iron and B12.  I reviewed the note from oncology that did not feel that it was related to his medications.  He is getting some screening labs blood cultures lactate.  He is got signs of possible cellulitis on his right lower extremity.  His blood pressures he says usually run low in the in the 110s.  He is currently in the  90s to low 100 so he may not be far off baseline.   [MB]  7867 Patient's lactate came back elevated at 3.35.  Have initiated sepsis pathway.  Antibiotics for cellulitis empirically ordered.   [MB]  5449 Patient's troponin back and elevated at 0.11.  His EKG is sinus rhythm with a rate of 89 first-degree AV block right bundle branch block similar to prior EKG 1/17   [MB]  1801 Updated the patient and his wife on the current lab work-up and the likely need for admission.   [MB]  2010 Patient has known coronary disease and cardiomyopathy with an EF of 35% per catheterization almost 2 years ago.  I note his lactate is elevated but I am a little worried about his ability to tolerate 2.4 L of fluid IV.  Will reassess after the first liter.   [MB]  0712 Patient's creatinine elevated also.  Talk to medicine hospitalist Dr. Ara Kussmaul who accepts for admission.   [MB]    Clinical Course User Index [MB] Hayden Rasmussen, MD   Sepsis - Repeat Assessment  Performed at:   [facility]     Blood pressure (!) 120/50, pulse 94, temperature 98.8 F (37.1 C), temperature  source Oral, resp. rate 17, height 6' 1"  (1.854 m), weight (!) 140.6 kg, SpO2 95 %.  Heart:     Regular rate and rhythm  Lungs:    CTA  Capillary Refill:   <2 sec  Peripheral Pulse:   Radial pulse palpable  Skin:     Normal Color     Final Clinical Impressions(s) / ED Diagnoses   Final diagnoses:  Sepsis, due to unspecified organism, unspecified whether acute organ dysfunction present [facility])  Cellulitis of right lower extremity  AKI (acute kidney injury) [facility])    ED Discharge Orders    None       Hayden Rasmussen, MD 09/07/18 2308

## 2018-09-07 NOTE — ED Notes (Signed)
Phlebotomy called for second set of cultures 

## 2018-09-08 ENCOUNTER — Inpatient Hospital Stay (HOSPITAL_COMMUNITY): Payer: Medicare Other

## 2018-09-08 ENCOUNTER — Other Ambulatory Visit: Payer: Self-pay

## 2018-09-08 DIAGNOSIS — A419 Sepsis, unspecified organism: Principal | ICD-10-CM

## 2018-09-08 DIAGNOSIS — R0602 Shortness of breath: Secondary | ICD-10-CM

## 2018-09-08 DIAGNOSIS — R609 Edema, unspecified: Secondary | ICD-10-CM

## 2018-09-08 LAB — CBC
HCT: 33.5 % — ABNORMAL LOW (ref 39.0–52.0)
Hemoglobin: 10.3 g/dL — ABNORMAL LOW (ref 13.0–17.0)
MCH: 28.8 pg (ref 26.0–34.0)
MCHC: 30.7 g/dL (ref 30.0–36.0)
MCV: 93.6 fL (ref 80.0–100.0)
Platelets: 60 10*3/uL — ABNORMAL LOW (ref 150–400)
RBC: 3.58 MIL/uL — ABNORMAL LOW (ref 4.22–5.81)
RDW: 19.1 % — ABNORMAL HIGH (ref 11.5–15.5)
WBC: 9.1 10*3/uL (ref 4.0–10.5)
nRBC: 0 % (ref 0.0–0.2)

## 2018-09-08 LAB — COMPREHENSIVE METABOLIC PANEL
ALBUMIN: 3.3 g/dL — AB (ref 3.5–5.0)
ALT: 70 U/L — ABNORMAL HIGH (ref 0–44)
AST: 66 U/L — AB (ref 15–41)
Alkaline Phosphatase: 65 U/L (ref 38–126)
Anion gap: 11 (ref 5–15)
BUN: 38 mg/dL — ABNORMAL HIGH (ref 8–23)
CO2: 20 mmol/L — AB (ref 22–32)
Calcium: 8.7 mg/dL — ABNORMAL LOW (ref 8.9–10.3)
Chloride: 105 mmol/L (ref 98–111)
Creatinine, Ser: 1.74 mg/dL — ABNORMAL HIGH (ref 0.61–1.24)
GFR calc Af Amer: 43 mL/min — ABNORMAL LOW (ref >60)
GFR calc non Af Amer: 37 mL/min — ABNORMAL LOW (ref >60)
Glucose, Bld: 132 mg/dL — ABNORMAL HIGH (ref 70–99)
Potassium: 4.3 mmol/L (ref 3.5–5.1)
Sodium: 136 mmol/L (ref 135–145)
Total Bilirubin: 1.4 mg/dL — ABNORMAL HIGH (ref 0.3–1.2)
Total Protein: 6.2 g/dL — ABNORMAL LOW (ref 6.5–8.1)

## 2018-09-08 LAB — TSH: TSH: 2.787 u[IU]/mL (ref 0.350–4.500)

## 2018-09-08 LAB — LACTIC ACID, PLASMA: Lactic Acid, Venous: 1.4 mmol/L (ref 0.5–1.9)

## 2018-09-08 LAB — TROPONIN I
Troponin I: 0.22 ng/mL (ref <0.03)
Troponin I: 0.26 ng/mL (ref <0.03)
Troponin I: 0.23 ng/mL (ref <0.03)

## 2018-09-08 LAB — GLUCOSE, CAPILLARY
Glucose-Capillary: 130 mg/dL — ABNORMAL HIGH (ref 70–99)
Glucose-Capillary: 164 mg/dL — ABNORMAL HIGH (ref 70–99)
Glucose-Capillary: 98 mg/dL (ref 70–99)
Glucose-Capillary: 157 mg/dL — ABNORMAL HIGH (ref 70–99)

## 2018-09-08 LAB — ECHOCARDIOGRAM COMPLETE
Height: 73 in
Weight: 5192.27 oz

## 2018-09-08 LAB — ABO/RH: ABO/RH(D): AB POS

## 2018-09-08 MED ORDER — CARVEDILOL 3.125 MG PO TABS
3.1250 mg | ORAL_TABLET | Freq: Two times a day (BID) | ORAL | Status: DC
  Administered 2018-09-08 – 2018-09-09 (×3): 3.125 mg via ORAL
  Filled 2018-09-08 (×3): qty 1

## 2018-09-08 MED ORDER — OCUVITE-LUTEIN PO CAPS: 1.0000 | ORAL_CAPSULE | Freq: Every day | ORAL | Status: DC

## 2018-09-08 MED ORDER — CEFAZOLIN SODIUM-DEXTROSE 1-4 GM/50ML-% IV SOLN
1.0000 g | Freq: Three times a day (TID) | INTRAVENOUS | Status: DC
  Administered 2018-09-08 – 2018-09-09 (×4): 1 g via INTRAVENOUS
  Filled 2018-09-08 (×5): qty 50

## 2018-09-08 MED ORDER — BOOST / RESOURCE BREEZE PO LIQD CUSTOM
1.0000 | Freq: Two times a day (BID) | ORAL | Status: DC
  Administered 2018-09-08 – 2018-09-09 (×3): 1 via ORAL

## 2018-09-08 MED ORDER — PROSIGHT PO TABS
1.0000 | ORAL_TABLET | Freq: Every day | ORAL | Status: DC
  Administered 2018-09-08 – 2018-09-09 (×2): 1 via ORAL
  Filled 2018-09-08 (×2): qty 1

## 2018-09-08 MED ORDER — LACTATED RINGERS IV SOLN
INTRAVENOUS | Status: AC
  Administered 2018-09-08 – 2018-09-09 (×2): via INTRAVENOUS

## 2018-09-08 MED ORDER — PERFLUTREN LIPID MICROSPHERE
1.0000 mL | INTRAVENOUS | Status: AC | PRN
  Administered 2018-09-08: 4 mL via INTRAVENOUS
  Filled 2018-09-08: qty 10

## 2018-09-08 NOTE — Progress Notes (Signed)
**Note De-Identified vi Obfusction** PROGRESS NOTE    Robert Gregory  MWN:[id number] DOB: 09/05/1941 DOA: 09/07/2018 PCP: Sewrd Crol, MD   Brief Nrrtive:  Robert Gregory is  77 y.o. mle with  known history of tril fibrilltion on Xrelto, dibetes, coronry rtery disese with AICD, congestive hert filure, hypertension, hyperlipidemi, chronic iron deficiency nemi/pernicious nemi obstructive sleep pne presents to the emergency deprtment for evlution of multiple complints including wekness, nuse, vomiting, fevers to 100.2 tody,  shortness of breth. He lso reports decresed PO intke. Ptient ws in  usul stte of helth until the pst month or so when he reports worsening wekness nd ftigue.  His hemtologist recently strted him on iron infusions nd B12 injections with miniml improvement in his symptoms.  He cme to the ER tody becuse of worsening ftigue over the pst 24 hours.  His wife noticed the onset of redness of his right lower extremity this morning.   Assessment & Pln:   Active Problems:   Sepsis due to cellulitis ([place])  #. Sepsis secondry to RLE cellulitis - IV ntibiotics: Rocephin -> ncef (12/20 - ) - IV fluid hydrtion - Blood cx pending  - Repet CBC in m.  - Check doppler of RLE to rule out DVT  # Generlized Mlise: he'd been getting B12 injections nd iron infusions with Dr. Mrin Olp, but possibly relted to infection bove.   Follow TSH, follow response to bx  #. Acute kidney injury  - IV fluids, continue - Cretinine peked t 2.0, improving, but still not to bseline (1.32) - Avoid nephrotoxic medictions  #. Elevted Troponin  Hx CAD s/p CABG:  - He's not complined of ny chest pin - Troponins re flt nd EKG ppers similr to priors (possible new isolted T wve in V2) - Echo with norml EF, but poor qulity, unble to ssess for WMA - Follow up with crds outptient (discussed with crds who greed)  #.  History of tril fibrilltion -Continue  Xrelto, Coreg  #. History of hypertension / CHF - Continue Lsix, potssium Coreg, spironolctone  #. History of hyperlipidemi - Continue Zeti, prvsttin, TriCor  #. History of dibetes - Accu-Cheks before mels nd t bedtime - Continue regulr insulin -Hold metformin  #.  Iron Deficiency Anemi  Pernicious Anemi  Chronic Thrombocytopeni 2/2 Splenomegly:  Follows with Dr. Mrin Olp, recently received iron infusion/B12 injections Continue to monitor dily CBC Monitor CBC  # Elevted LFTs: follow cute heptitis pnel, follow CMP  DVT prophylxis: xrelto Code Sttus: full  Fmily Communiction: wife t bedside Disposition Pln: pending further improvement   Consultnts:   none  Procedures:  LE [place]: Right: There is no evidence of deep vein thrombosis in the lower extremity. However, portions of this exmintion were limited- see technologist comments bove. No cystic structure found in the poplitel foss. Left: No evidence of common femorl vein obstruction.  Echo  Study Conclusions  - Left ventricle: The cvity size ws norml. Wll thickness ws   norml. Systolic function ws norml. The estimted ejection   frction ws in the rnge of 55% to 60%. Wll motion ws norml;   there were no regionl wll motion bnormlities. The study is   not techniclly sufficient to llow evlution of LV distolic   function.  Impressions:  - Extremely limited; definity used; overll norml LV function;   cnnot R/O wll motion bnormlity.  Antimicrobils:  Anti-infectives (From dmission, onwrd)   Strt     Dose/Rte Route Frequency Ordered Stop   09/08/18 1400  ceFZolin (NCEF) IVPB 1 g/50 mL premix     1 g 100 mL/hr over 30 Minutes Intravenous Every 8 hours 09/08/18 1102     09/07/18 2145  cefTRIXone (ROCEPHIN) 2 g in sodium chloride 0.9 % 100 mL IVPB  Status:  Discontinued     2 g 200 mL/hr over 30 Minutes Intravenous Every 24 hours  09/07/18 2143 09/07/18 2229   09/07/18 1745  cefTRIXone (ROCEPHIN) 2 g in sodium chloride 0.9 % 100 mL IVPB  Status:  Discontinued     2 g 200 mL/hr over 30 Minutes Intravenous Every 24 hours 09/07/18 1740 09/08/18 0912      Subjective: Extremely fatigued about 2 days ago.  Noticed redness to RLE.  Objective: Vitals:   09/08/18 0230 09/08/18 0245 09/08/18 0531 09/08/18 1519  BP:  (!) 103/56 (!) 90/53 111/64  Pulse: 88 88 86 75  Resp: (!) 21 20 (!) 24 20  Temp:  99.2 F (37.3 C) 99.8 F (37.7 C) 97.8 F (36.6 C)  TempSrc:  Oral Oral Oral  SpO2: 100% 97% 95% 97%  Weight:  (!) 147.8 kg (!) 147.2 kg   Height:  6' 1"  (1.854 m)      Intake/Output Summary (Last 24 hours) at 09/08/2018 1648 Last data filed at 09/08/2018 1629 Gross per 24 hour  Intake 4684.34 ml  Output 1400 ml  Net 3284.34 ml   Filed Weights   09/07/18 1611 09/08/18 0245 09/08/18 0531  Weight: (!) 140.6 kg (!) 147.8 kg (!) 147.2 kg    Examination:  General exam: ppears calm and comfortable  Respiratory system: Clear to auscultation. Respiratory effort normal. Cardiovascular system: S1 & S2 heard, RRR. Gastrointestinal system: bdomen is nondistended, soft and nontender.  Central nervous system: lert and oriented. No focal neurological deficits. Extremities: Symmetric 5 x 5 power. Skin: erythema to RLE, warmth, swelling  Psychiatry: Judgement and insight appear normal. Mood & affect appropriate.     Data Reviewed: I have personally reviewed following labs and imaging studies  CBC: Recent Labs  Lab 09/07/18 1730 09/08/18 0433  WBC 14.1* 9.1  NEUTROBS 12.2*  --   HGB 12.3* 10.3*  HCT 41.5 33.5*  MCV 97.2 93.6  PLT 74* 60*   Basic Metabolic Panel: Recent Labs  Lab 09/07/18 1730 09/07/18 2144 09/08/18 0433  N 137  --  136  K 4.5  --  4.3  CL 101  --  105  CO2 22  --  20*  GLUCOSE 170*  --  132*  BUN 39*  --  38*  CRETININE 2.00*  --  1.74*  CLCIUM 9.9  --  8.7*  MG  --  2.1   --   PHOS  --  2.2*  --    GFR: Estimated Creatinine Clearance: 53.7 mL/min () (by C-G formula based on SCr of 1.74 mg/dL (H)). Liver Function Tests: Recent Labs  Lab 09/07/18 1730 09/08/18 0433  ST 68* 66*  LT 81* 70*  LKPHOS 83 65  BILITOT 1.6* 1.4*  PROT 7.6 6.2*  LBUMIN 4.0 3.3*   Recent Labs  Lab 09/07/18 1730  LIPSE 33   No results for input(s): MMONI in the last 168 hours. Coagulation Profile: No results for input(s): INR, PROTIME in the last 168 hours. Cardiac Enzymes: Recent Labs  Lab 09/08/18 0216 09/08/18 0545 09/08/18 1200  TROPONINI 0.22* 0.26* 0.23*   BNP (last 3 results) No results for input(s): PROBNP in the last 8760 hours. Hb1C: No results for input(s): HGB1C **Note De-Identified vi Obfusction** in the lst 72 hours. CBG: Recent Lbs  Lb 09/07/18 2210 09/08/18 0733 09/08/18 1137 09/08/18 1633  GLUCP 159* 130* 164* 98   Lipid Profile: No results for input(s): CHOL, HDL, LDLCLC, TRIG, CHOLHDL, LDLDIRECT in the lst 72 hours. Thyroid Function Tests: No results for input(s): TSH, T4TOTL, FREET4, T3FREE, THYROIDB in the lst 72 hours. nemi Pnel: No results for input(s): VITMINB12, FOLTE, FERRITIN, TIBC, IRON, RETICCTPCT in the lst 72 hours. Sepsis Lbs: Recent Lbs  Lb 09/07/18 1735 09/07/18 2215 09/07/18 2219 09/08/18 0433  LTICCIDVEN 3.35* 1.8 1.82 1.4    Recent Results (from the pst 240 hour(s))  Culture, blood (routine x 2)     Sttus: None (Preliminry result)   Collection Time: 09/07/18  4:45 PM  Result Vlue Ref Rnge Sttus   Specimen Description   Finl    BLOOD RIGHT NTECUBITL Performed t [facility], [place] [address], [place], [place] [address]    Specil Requests   Finl    BOTTLES DRWN EROBIC ND NEROBIC Blood Culture dequte volume Performed t [facility] [address], [place], [place] [address]    Culture   Finl    NO GROWTH < 24 HOURS Performed t [facility] [address], [place], [place] [address]    Report Sttus PENDING  Incomplete         Rdiology Studies: Dg Chest Port 1 View  Result Dte: 09/07/2018 CLINIC[facility] DT:  [place] EXM: PORTBLE CHEST 1 VIEW COMPRISON:  08/31/2016 FINDINGS: Post sternotomy chnges. Left-sided pcing device s before. Borderline crdiomegly. No focl irspce disese. ortic therosclerosis. No pneumothorx. IMPRESSION: Borderline to mild crdiomegly with slight centrl congestion. Negtive for pleurl effusion or focl irspce disese Electroniclly Signed   By: Donvn Foil M.D.   On: 09/07/2018 17:07   Vs Kore Lower Extremity Venous (dvt)  Result Dte: 09/08/2018  Lower Venous Study Indictions: Swelling.  Limittions: Body hbitus nd poor ultrsound/tissue interfce. Performing Technologist: Mudry Myhew MH, RDMS, RVT, RDCS  Exmintion Guidelines:  complete evlution includes B-mode imging, spectrl Doppler, color Doppler, nd power Doppler s needed of ll ccessible portions of ech vessel. Bilterl testing is considered n integrl prt of  complete exmintion. Limited exmintions for reoccurring indictions my be performed s noted.  Right Venous Findings: +---------+---------------+---------+-----------+----------+------------------+          CompressibilityPhsicitySpontneityPropertiesSummry            +---------+---------------+---------+-----------+----------+------------------+ CFV      Full           Yes      Yes                                     +---------+---------------+---------+-----------+----------+------------------+ SFJ      Full                                                            +---------+---------------+---------+-----------+----------+------------------+ FV Prox  Full                                                            +---------+---------------+---------+-----------+----------+------------------+  FV Mid   Full                                                             +---------+---------------+---------+-----------+----------+------------------+ FV DistalFull                                                            +---------+---------------+---------+-----------+----------+------------------+ PFV      Full                                                            +---------+---------------+---------+-----------+----------+------------------+ POP      Full           Yes      Yes                                     +---------+---------------+---------+-----------+----------+------------------+ PTV      Full                                         Limited evaluation +---------+---------------+---------+-----------+----------+------------------+ PERO     Full                                         Limited evaluation +---------+---------------+---------+-----------+----------+------------------+  Left Venous Findings: +---+---------------+---------+-----------+----------+-------+    CompressibilityPhasicitySpontaneityPropertiesSummary +---+---------------+---------+-----------+----------+-------+ CFVFull           Yes      Yes                          +---+---------------+---------+-----------+----------+-------+    Summary: Right: There is no evidence of deep vein thrombosis in the lower extremity. However, portions of this examination were limited- see technologist comments above. No cystic structure found in the popliteal fossa. Left: No evidence of common femoral vein obstruction.  *See table(s) above for measurements and observations. Electronically signed by Monica Martinez MD on 09/08/2018 at 12:23:56 PM.    Final         Scheduled Meds: . acidophilus  1 capsule Oral Daily  . carvedilol  3.125 mg Oral BID WC  . cholecalciferol  2,000 Units Oral Daily  . ezetimibe  10 mg Oral Daily  . feeding supplement  1 Container Oral BID BM  . fenofibrate  54 mg Oral Daily  .  insulin aspart  0-5 Units Subcutaneous QHS  . insulin aspart  0-9 Units Subcutaneous TID WC  . insulin regular human CONCENTRATED  100 Units Subcutaneous Q lunch  . insulin regular human CONCENTRATED  140 Units Subcutaneous Q breakfast  . multivitamin  1 tablet Oral Daily  . omega-3 acid ethyl esters  2 g Oral BID  .  potassium chloride  20 mEq Oral BID  . pravastatin  80 mg Oral Daily  . rivaroxaban  20 mg Oral QPM   Continuous Infusions: . sodium chloride 75 mL/hr at 09/08/18 1629  .  ceFAZolin (ANCEF) IV Stopped (09/08/18 1459)     LOS: 1 day    Time spent: over 93 min    Fayrene Helper, MD [facility] Pager [phone]  If 7PM-7AM, please contact night-coverage [email or web address] Password [facility] 09/08/2018, 4:48 PM

## 2018-09-08 NOTE — Progress Notes (Signed)
Right lower extremity venous duplex completed. Refer to "CV Proc" under chart review to view preliminary results.  09/08/2018 12:05 PM Maudry Mayhew, MHA, RVT, RDCS, RDMS

## 2018-09-08 NOTE — Evaluation (Signed)
Physical Therapy Evaluation Patient Details Name: Robert Gregory MRN: [id number] DOB: Oct 25, 1940 Today's Date: 09/08/2018   History of Present Illness  77 yo male admitted with sepsis, R LE cellulitis. Hx of R TKA 2016. Hx of CAD, MI, AICD, A fib, CABG, OSA, spinal stenosis, DM, peripheral neuropathy, CHF, OA, L TKA, RCR, laminectomy  Clinical Impression  On eval, pt was Min guard for mobility. He took a few steps in the room with a RW. Pt c/o LE weakness and requested to keep ambulation distance short on today. Discussed d/c plan-pt is agreeable to HHPT f/u. Will follow and progress activity as tolerated.     Follow Up Recommendations Home health PT;Supervision - Intermittent    Equipment Recommendations  None recommended by PT    Recommendations for Other Services       Precautions / Restrictions Precautions Precautions: Fall Restrictions Weight Bearing Restrictions: No      Mobility  Bed Mobility Overal bed mobility: Needs Assistance Bed Mobility: Supine to Sit     Supine to sit: Supervision;HOB elevated     General bed mobility comments: for safety. increased time.  Transfers Overall transfer level: Needs assistance Equipment used: Rolling walker (2 wheeled) Transfers: Sit to/from Stand Sit to Stand: Min guard         General transfer comment: Close guard for safety. VCs safety, hand placement. Pt stood at bedside for several minutes before taking any steps. Pt c/o legs feeling weak.   Ambulation/Gait Ambulation/Gait assistance: Min guard Gait Distance (Feet): 7 Feet Assistive device: Rolling walker (2 wheeled) Gait Pattern/deviations: Step-through pattern;Decreased stride length     General Gait Details: Unsteady. Fatigues quickly. Pt c/o legs feeling weak so limited distance for safety reasons.   Stairs            Wheelchair Mobility    Modified Rankin (Stroke Patients Only)       Balance Overall balance assessment: Mild deficits  observed, not formally tested                                           Pertinent Vitals/Pain Pain Assessment: No/denies pain    Home Living Family/patient expects to be discharged to:: Private residence Living Arrangements: Spouse/significant other Available Help at Discharge: Family Type of Home: House Home Access: Level entry     Home Layout: One level Home Equipment: Environmental consultant - 2 wheels;Cane - single point;Shower seat - built in;Grab bars - tub/shower;Toilet riser      Prior Function Level of Independence: Independent               Hand Dominance        Extremity/Trunk Assessment   Upper Extremity Assessment Upper Extremity Assessment: Overall WFL for tasks assessed    Lower Extremity Assessment Lower Extremity Assessment: Generalized weakness    Cervical / Trunk Assessment Cervical / Trunk Assessment: Normal  Communication   Communication: No difficulties  Cognition Arousal/Alertness: Awake/alert Behavior During Therapy: WFL for tasks assessed/performed Overall Cognitive Status: Within Functional Limits for tasks assessed                                        General Comments      Exercises     Assessment/Plan    PT Assessment Patient needs continued PT  services  PT Problem List Decreased strength;Decreased mobility;Decreased activity tolerance;Decreased balance;Decreased knowledge of use of DME       PT Treatment Interventions DME instruction;Gait training;Functional mobility training;Therapeutic activities;Balance training;Patient/family education;Therapeutic exercise    PT Goals (Current goals can be found in the Care Plan section)  Acute Rehab PT Goals Patient Stated Goal: to regain PLOF PT Goal Formulation: With patient Time For Goal Achievement: 09/22/18 Potential to Achieve Goals: Good    Frequency Min 3X/week   Barriers to discharge        Co-evaluation               AM-PAC PT "6  Clicks" Mobility  Outcome Measure Help needed turning from your back to your side while in a flat bed without using bedrails?: A Little Help needed moving from lying on your back to sitting on the side of a flat bed without using bedrails?: A Little Help needed moving to and from a bed to a chair (including a wheelchair)?: A Little Help needed standing up from a chair using your arms (e.g., wheelchair or bedside chair)?: A Little Help needed to walk in hospital room?: A Little Help needed climbing 3-5 steps with a railing? : A Little 6 Click Score: 18    End of Session   Activity Tolerance: Patient limited by fatigue Patient left: in bed;with call bell/phone within reach   PT Visit Diagnosis: Muscle weakness (generalized) (M62.81);Unsteadiness on feet (R26.81)    Time: 3612-2449 PT Time Calculation (min) (ACUTE ONLY): 15 min   Charges:   PT Evaluation $PT Eval Moderate Complexity: D[place], PT Acute Rehabilitation Services Pager: [phone] Office: [phone]

## 2018-09-08 NOTE — Progress Notes (Signed)
Pharmacy Antibiotic Note  Robert Gregory is a 77 y.o. male admitted on 09/07/2018 with cellulitis.  Pharmacy has been consulted for Ancef dosing.  Plan: Ancef 1gm IV q8h Monitor renal function and cx data  No dose adjustments anticipated- pharmacy to sign off  Height: 6' 1"  (185.4 cm) Weight: (!) 324 lb 8.3 oz (147.2 kg) IBW/kg (Calculated) : 79.9  Temp (24hrs), Avg:99.3 F (37.4 C), Min:98.8 F (37.1 C), Max:99.8 F (37.7 C)  Recent Labs  Lab 09/07/18 1730 09/07/18 1735 09/07/18 2215 09/07/18 2219 09/08/18 0433  WBC 14.1*  --   --   --  9.1  CREATININE 2.00*  --   --   --  1.74*  LATICACIDVEN  --  3.35* 1.8 1.82 1.4    Estimated Creatinine Clearance: 53.7 mL/min (A) (by C-G formula based on SCr of 1.74 mg/dL (H)).    Allergies  Allergen Reactions  . Iodine     flushing  . Seasonal Ic [Cholestatin]   . Tape Other (See Comments)    Rash  Rash   . Contrast Media [Iodinated Diagnostic Agents] Rash and Other (See Comments)    flushing EXTREME FLUSHING   . Ioxaglate Other (See Comments) and Rash    flushing EXTREME FLUSHING     Antimicrobials this admission: Rocephin 12/19>>12/20 Ancef 12/20>>  Dose adjustments this admission:  Microbiology results: 12/20 Bcx (after abx started): 12/19 BCx: 12/29 Influenza PCR: negative  Thank you for allowing pharmacy to be a part of this patient's care.  Biagio Borg 09/08/2018 9:24 AM

## 2018-09-08 NOTE — Progress Notes (Signed)
On call provider [facility]) notified of critical troponin of 0.22 up from 0.11. Notified of significant heart hx with MI, CABG, AICD and EKG completed.

## 2018-09-08 NOTE — Progress Notes (Signed)
Initial Nutrition Assessment  DOCUMENTATION CODES:   Morbid obesity  INTERVENTION:  Glucerna Shake po BID, each supplement provides 220 kcal and 10 grams of protein (chocolate) Should patient experience GI discomfort from ONS will recommend Boost Breeze MVI   NUTRITION DIAGNOSIS:   Increased nutrient needs related to acute illness(sepsis secondary to cellulitis) as evidenced by energy intake < 75% for > 7 days, percent weight loss, per patient/family report.  GOAL:   Patient will meet greater than or equal to 90% of their needs   MONITOR:   Labs, PO intake, I & O's, Supplement acceptance, Weight trends, Skin  REASON FOR ASSESSMENT:   Malnutrition Screening Tool    ASSESSMENT:  77 year old patient with known history of a-fib, DM, CAD with AICD, CHF, HTN, HLD, chronic iron deficiency, obstructive sleep apnea presented to ED for n/v, weakness, fever, SOB, and decreased PO, and redness to RLE   Cardiovascular Sonographer finishing with patient at time of visit. Patient reports feeling a little better this morning but endorses nausea after eating 2/3 of breakfast this morning (oatmeal, toast, milk)   Prior to admission patient recalls 3 meals/day with breakfast consisting of oatmeal/cereal, eggs, sausage/bacon; lunch patient stated it was his only full meal and he would have meat, veg, pot. Dinner was usually lighter and he enjoyed going out to eat with his wife where they would share a meat and 2 veggies. Patient informed RD that he likes to stick to a higher protein diet and drinks 2 gallons of H2O daily.  Patient reports feeling nausea with 2 episodes of vomiting before coming to the ED and informed RD that he has only vomited 4 times in his life so he knew something was wrong.   Patient could not recall UBW and endorses 15lb wt loss in 3 weeks. He stated that he began taking B12 and iron for low platelets approximately 1 month ago and attributes the wt loss and current admission  to the medication.   Patient agreeable to ONS during stay and reports drinking Glucerna at home. RD offered Boost Breeze to patient if he finds Glucerna is causing GI discomfort.   Medications reviewed and include: vitD, fenofibrate, novolog (0-5 units) at bedtime, (0-9 units) with meals, humulin (100 units) with lunch, (140 units) with breakfast, omega3, KCl 79mq BID, zofran  0.9% NaCl @75mL /hr  Labs: CO2 20 (L) Corrected Ca for low albumin 8.6 (L)   NUTRITION - FOCUSED PHYSICAL EXAM:    Most Recent Value  Orbital Region  Mild depletion  Upper Arm Region  No depletion  Thoracic and Lumbar Region  No depletion  Buccal Region  Mild depletion  Temple Region  Mild depletion  Clavicle Bone Region  No depletion  Clavicle and Acromion Bone Region  No depletion  Scapular Bone Region  No depletion  Dorsal Hand  No depletion  Patellar Region  No depletion  Anterior Thigh Region  No depletion  Posterior Calf Region  No depletion  Edema (RD Assessment)  Mild [non-ppitting,  RLE]  Hair  Reviewed  Eyes  Reviewed  Mouth  Reviewed  Skin  Reviewed  Nails  Reviewed       Diet Order:   Diet Order            Diet heart healthy/carb modified Room service appropriate? Yes; Fluid consistency: Thin  Diet effective now              EDUCATION NEEDS:   No education needs have been identified  at this time  Skin:  Skin Assessment: Reviewed RN Assessment(cellulitis; rt ankle; leg)  Last BM:  09/07/2018  Height:   Ht Readings from Last 1 Encounters:  09/08/18 6' 1"  (1.854 m)    Weight: 323.8lbs  Wt Readings from Last 1 Encounters:  09/08/18 (!) 147.2 kg    Ideal Body Weight:  83.6 kg (184lbs)  BMI:  Body mass index is 42.81 kg/m.  Estimated Nutritional Needs:   Kcal:  1674-2552 (MSJ 1.2-1.25)  Protein:  118-134 (g/kg/IBW)  Fluid:  </=1.9L/day    Lajuan Lines, RD, LDN  After Hours/Weekend Pager: [phone]

## 2018-09-08 NOTE — ED Notes (Signed)
ED TO INPATIENT HANDOFF REPORT  Name/Age/Gender Robert Gregory 77 y.o. male  Code Status    Code Status Orders  (From admission, onward)         Start     Ordered   09/07/18 2144  Full code  Continuous     09/07/18 2143        Code Status History    Date Active Date Inactive Code Status Order ID Comments User Context   10/02/2015 0927 10/02/2015 1440 Full Code [id number]  Belva Crome, MD Inpatient   11/29/2014 1659 12/04/2014 1641 Full Code [id number]  Leandrew Koyanagi, MD Inpatient    Advance Directive Documentation     Most Recent Value  Type of Advance Directive  Healthcare Power of Attorney, Living will  Pre-existing out of facility DNR order (yellow form or pink MOST form)  -  "MOST" Form in Place?  -      Home/SNF/Other Home  Chief Complaint generalized weakness; fever  Level of Care/Admitting Diagnosis ED Disposition    ED Disposition Condition [place]: [place] [100102]  Level of Care: Telemetry [5]  Admit to tele based on following criteria: Other see comments  Comments: Sepsis  Diagnosis: Sepsis due to cellulitis [facility]) [[id number]]  Admitting Physician: Harvie Bridge [[id number]]  Attending Physician: Harvie Bridge [[id number]]  Estimated length of stay: past midnight tomorrow  Certification:: I certify this patient will need inpatient services for at least 2 midnights  PT Class (Do Not Modify): Inpatient [101]  PT Acc Code (Do Not Modify): Private [1]       Medical History Past Medical History:  Diagnosis Date  . Arthritis   . Atrial fibrillation ([place])    takes Xarelto daily  . CAD (coronary artery disease)   . Cardiomyopathy, ischemic 11/07/2013  . Cholelithiasis   . Chronic back pain    scoliosis/arthritis  . Complication of anesthesia    forgetful for about 2-3wks after anesthesia  . Congestive heart failure ([place])    takes Spironolactone daily  . Diabetes mellitus ([place])    takes Invokamet  daily as well as Hum U  . History of colon polyps   . History of kidney stones   . Hyperlipidemia    takes [employer] daily  . Hyperlipidemia 11/07/2013  . Hypertension    takes Benicar and Coreg daily  . ICD (implantable cardioverter-defibrillator) battery depletion   . ICD (implantable cardioverter-defibrillator) in place 11/07/2013  . Iron deficiency anemia due to chronic blood loss 08/24/2018  . Iron malabsorption 08/24/2018  . Ischemic cardiomyopathy   . Ischemic cardiomyopathy   . Joint pain   . Myocardial infarction ([place]) 1997   on the OR table   . Nephrolithiasis   . Obstructive sleep apnea   . Peripheral neuropathy   . Pernicious anemia 08/24/2018  . Pneumonia    as a child  . Spinal stenosis   . Thrombocytopenia ([place])   . Total knee replacement status 11/29/2014  . Type 2 diabetes mellitus with stage 3 chronic kidney disease ([place]) 05/19/2015  . Urinary frequency   . Urinary urgency     Allergies Allergies  Allergen Reactions  . Iodine     flushing  . Seasonal Ic [Cholestatin]   . Tape Other (See Comments)    Rash  Rash   . Contrast Media [Iodinated Diagnostic Agents] Rash and Other (See Comments)    flushing EXTREME FLUSHING   . Ioxaglate Other (See Comments)  and Rash    flushing EXTREME FLUSHING     IV Location/Drains/Wounds Patient Lines/Drains/Airways Status   Active Line/Drains/Airways    Name:   Placement date:   Placement time:   Site:   Days:   Peripheral IV 09/07/18 Left Antecubital   09/07/18    -    Antecubital   1   Peripheral IV 09/07/18 Right Antecubital   09/07/18    1724    Antecubital   1   Incision (Closed) 11/29/14 Knee Right   11/29/14    1434     1379          Labs/Imaging Results for orders placed or performed during the hospital encounter of 09/07/18 (from the past 48 hour(s))  Type and screen [place]     Status: None   Collection Time: 09/07/18  5:18 PM  Result Value Ref Range    ABO/RH(D) AB POS    Antibody Screen NEG    Sample Expiration      09/10/2018 Performed at [facility], [place] [address] [place], [place] [address]   ABO/Rh     Status: None (Preliminary result)   Collection Time: 09/07/18  5:18 PM  Result Value Ref Range   ABO/RH(D)      AB POS Performed at [facility], [place] [address], [place], [place] [address]   Comprehensive metabolic panel     Status: Abnormal   Collection Time: 09/07/18  5:30 PM  Result Value Ref Range   Sodium 137 135 - 145 mmol/L   Potassium 4.5 3.5 - 5.1 mmol/L   Chloride 101 98 - 111 mmol/L   CO2 22 22 - 32 mmol/L   Glucose, Bld 170 (H) 70 - 99 mg/dL   BUN 39 (H) 8 - 23 mg/dL   Creatinine, Ser 2.00 (H) 0.61 - 1.24 mg/dL   Calcium 9.9 8.9 - 10.3 mg/dL   Total Protein 7.6 6.5 - 8.1 g/dL   Albumin 4.0 3.5 - 5.0 g/dL   AST 68 (H) 15 - 41 U/L   ALT 81 (H) 0 - 44 U/L   Alkaline Phosphatase 83 38 - 126 U/L   Total Bilirubin 1.6 (H) 0.3 - 1.2 mg/dL   GFR calc non Af Amer 31 (L) >60 mL/min   GFR calc Af Amer 36 (L) >60 mL/min   Anion gap 14 5 - 15    Comment: Performed at [facility], [place] [address] [place], [place] [address]  Lipase, blood     Status: None   Collection Time: 09/07/18  5:30 PM  Result Value Ref Range   Lipase 33 11 - 51 U/L    Comment: Performed at [facility], [place] [address] [place],  [address]  CBC with Differential     Status: Abnormal   Collection Time: 09/07/18  5:30 PM  Result Value Ref Range   WBC 14.1 (H) 4.0 - 10.5 K/uL   RBC 4.27 4.22 - 5.81 MIL/uL   Hemoglobin 12.3 (L) 13.0 - 17.0 g/dL   HCT 41.5 39.0 - 52.0 %   MCV 97.2 80.0 - 100.0 fL   MCH 28.8 26.0 - 34.0 pg   MCHC 29.6 (L) 30.0 - 36.0 g/dL   RDW 19.5 (H) 11.5 - 15.5 %   Platelets 74 (L) 150 - 400 K/uL    Comment: REPEATED TO VERIFY PLATELET COUNT CONFIRMED BY SMEAR SPECIMEN CHECKED FOR CLOTS Immature Platelet Fraction may be clinically  indicated, consider ordering this additional test [id number]    nRBC 0.0 0.0 - 0.2 %   Neutrophils Relative % 87 %   Neutro Abs 12.2 (H) 1.7 - 7.7 K/uL   Lymphocytes Relative 3 %   Lymphs Abs 0.4 (L) 0.7 - 4.0 K/uL   Monocytes Relative 9 %   Monocytes Absolute 1.3 (H) 0.1 - 1.0 K/uL   Eosinophils Relative 0 %   Eosinophils Absolute 0.0 0.0 - 0.5 K/uL   Basophils Relative 0 %   Basophils Absolute 0.0 0.0 - 0.1 K/uL   Immature Granulocytes 1 %   Abs Immature Granulocytes 0.15 (H) 0.00 - 0.07 K/uL    Comment: Performed at [facility], [place] [address], [place], [place] [address]  I-stat troponin, ED     Status: Abnormal   Collection Time: 09/07/18  5:33 PM  Result Value Ref Range   Troponin i, poc 0.11 (HH) 0.00 - 0.08 ng/mL   Comment NOTIFIED PHYSICIAN    Comment 3            Comment: Due to the release kinetics of cTnI, a negative result within the first hours of the onset of symptoms does not rule out myocardial infarction with certainty. If myocardial infarction is still suspected, repeat the test at appropriate intervals.   I-Stat CG4 Lactic Acid, ED     Status: Abnormal   Collection Time: 09/07/18  5:35 PM  Result Value Ref Range   Lactic Acid, Venous 3.35 (HH) 0.5 - 1.9 mmol/L   Comment NOTIFIED PHYSICIAN   Influenza panel by PCR (type A & B)     Status: None   Collection Time: 09/07/18  6:07 PM  Result Value Ref Range   Influenza A By PCR NEGATIVE NEGATIVE   Influenza B By PCR NEGATIVE NEGATIVE    Comment: (NOTE) The Xpert Xpress Flu assay is intended as an aid in the diagnosis of  influenza and should not be used as a sole basis for treatment.  This  assay is FDA approved for nasopharyngeal swab specimens only. Nasal  washings and aspirates are unacceptable for Xpert Xpress Flu testing. Performed at [place] [facility], [place] [address], [place], [place] [address]   Urinalysis, Routine w reflex microscopic     Status: Abnormal    Collection Time: 09/07/18  6:15 PM  Result Value Ref Range   Color, Urine AMBER (A) YELLOW    Comment: BIOCHEMICALS MAY BE AFFECTED BY COLOR   APPearance HAZY (A) CLEAR   Specific Gravity, Urine 1.016 1.005 - 1.030   pH 5.0 5.0 - 8.0   Glucose, UA NEGATIVE NEGATIVE mg/dL   Hgb urine dipstick NEGATIVE NEGATIVE   Bilirubin Urine NEGATIVE NEGATIVE   Ketones, ur NEGATIVE NEGATIVE mg/dL   Protein, ur 30 (A) NEGATIVE mg/dL   Nitrite NEGATIVE NEGATIVE   Leukocytes, UA NEGATIVE NEGATIVE   RBC / HPF 0-5 0 - 5 RBC/hpf   WBC, UA 0-5 0 - 5 WBC/hpf   Bacteria, UA NONE SEEN NONE SEEN   Squamous Epithelial / LPF 0-5 0 - 5   Mucus PRESENT    Hyaline Casts, UA PRESENT     Comment: Performed at [facility], [place] [address], [place], [place] [address]  Magnesium     Status: None   Collection Time: 09/07/18  9:44 PM  Result Value Ref Range   Magnesium 2.1 1.7 - 2.4 mg/dL    Comment: Performed at [facility], [place]  [address] [place], [place] [address]  Phosphorus     Status: Abnormal   Collection Time: 09/07/18  9:44 PM  Result Value Ref Range   Phosphorus 2.2 (L) 2.5 - 4.6 mg/dL    Comment: Performed at [facility], [place] [address], [place], [place] [address]  CBG monitoring, ED     Status: Abnormal   Collection Time: 09/07/18 10:10 PM  Result Value Ref Range   Glucose-Capillary 159 (H) 70 - 99 mg/dL  Lactic acid, plasma     Status: None   Collection Time: 09/07/18 10:15 PM  Result Value Ref Range   Lactic Acid, Venous 1.8 0.5 - 1.9 mmol/L    Comment: Performed at [facility], [place] [address], [place],  [address]  I-Stat CG4 Lactic Acid, ED     Status: None   Collection Time: 09/07/18 10:19 PM  Result Value Ref Range   Lactic Acid, Venous 1.82 0.5 - 1.9 mmol/L   Dg Chest Port 1 View  Result Date: 09/07/2018 CLINICAL DATA:  Weakness EXAM: PORTABLE CHEST 1 VIEW COMPARISON:  08/31/2016 FINDINGS: Post  sternotomy changes. Left-sided pacing device as before. Borderline cardiomegaly. No focal airspace disease. Aortic atherosclerosis. No pneumothorax. IMPRESSION: Borderline to mild cardiomegaly with slight central congestion. Negative for pleural effusion or focal airspace disease Electronically Signed   By: Donavan Foil M.D.   On: 09/07/2018 17:07   EKG Interpretation  Date/Time:  Thursday September 07 2018 17:41:35 EST Ventricular Rate:  89 PR Interval:    QRS Duration: 157 QT Interval:  386 QTC Calculation: 470 R Axis:   -25 Text Interpretation:  Sinus or ectopic atrial rhythm Prolonged PR interval Right bundle branch block Abnormal inferior Q waves similar to prior 1/17 Confirmed by Aletta Edouard [id number]) on 09/07/2018 5:52:57 PM   Pending Labs Unresulted Labs (From admission, onward)    Start     Ordered   09/08/18 0500  CBC  Tomorrow morning,   R     09/07/18 2143   09/08/18 0500  Comprehensive metabolic panel  Tomorrow morning,   R     09/07/18 2143   09/08/18 0000  Troponin I - Now Then Q6H  Now then every 6 hours,   R     09/07/18 2145   09/07/18 2144  Lactic acid, plasma  STAT Now then every 3 hours,   STAT     09/07/18 2143   09/07/18 1645  Culture, blood (routine x 2)  BLOOD CULTURE X 2,   STAT     09/07/18 1644          Vitals/Pain Today's Vitals   09/08/18 0100 09/08/18 0118 09/08/18 0130 09/08/18 0200  BP: (!) 67/44 (!) 98/56 (!) 107/45 (!) 113/50  Pulse: 83  85 82  Resp: (!) 24  (!) 27 (!) 9  Temp:      TempSrc:      SpO2: 96%  98% 98%  Weight:      Height:      PainSc:        Isolation Precautions No active isolations  Medications Medications  cefTRIAXone (ROCEPHIN) 2 g in sodium chloride 0.9 % 100 mL IVPB (0 g Intravenous Stopped 09/07/18 1827)  carvedilol (COREG) tablet 6.25 mg (6.25 mg Oral Given 09/07/18 2255)  ezetimibe (ZETIA) tablet 10 mg (10 mg Oral Given 09/07/18 2254)  furosemide (LASIX) tablet 80 mg (80 mg Oral Given 09/07/18 2253)   nitroGLYCERIN (NITROSTAT) SL tablet 0.4 mg (has no administration in time  range)  omega-3 acid ethyl esters (LOVAZA) capsule 2 g (2 g Oral Given 09/07/18 2255)  spironolactone (ALDACTONE) tablet 25 mg (25 mg Oral Given 09/07/18 2253)  fenofibrate tablet 54 mg (54 mg Oral Given 09/07/18 2254)  insulin regular human CONCENTRATED (HUMULIN R) 500 UNIT/ML injection 140 Units (has no administration in time range)  rivaroxaban (XARELTO) tablet 20 mg (20 mg Oral Given 09/07/18 2253)  acidophilus (RISAQUAD) capsule 1 capsule (1 capsule Oral Given 09/07/18 2254)  cholecalciferol (VITAMIN D3) tablet 2,000 Units (2,000 Units Oral Given 09/07/18 2254)  potassium chloride (K-DUR,KLOR-CON) CR tablet 20 mEq (20 mEq Oral Given 09/07/18 2253)  0.9 %  sodium chloride infusion ( Intravenous Transfusing/Transfer 09/08/18 0235)  acetaminophen (TYLENOL) tablet 650 mg (has no administration in time range)    Or  acetaminophen (TYLENOL) suppository 650 mg (has no administration in time range)  oxyCODONE (Oxy IR/ROXICODONE) immediate release tablet 5 mg (has no administration in time range)  senna-docusate (Senokot-S) tablet 1 tablet (has no administration in time range)  bisacodyl (DULCOLAX) EC tablet 5 mg (has no administration in time range)  magnesium citrate solution 1 Bottle (has no administration in time range)  ondansetron (ZOFRAN) tablet 4 mg (has no administration in time range)    Or  ondansetron (ZOFRAN) injection 4 mg (has no administration in time range)  albuterol (PROVENTIL) (2.5 MG/3ML) 0.083% nebulizer solution 2.5 mg (has no administration in time range)  ipratropium (ATROVENT) nebulizer solution 0.5 mg (has no administration in time range)  insulin aspart (novoLOG) injection 0-9 Units (has no administration in time range)  insulin aspart (novoLOG) injection 0-5 Units (0 Units Subcutaneous Not Given 09/07/18 2300)  insulin regular human CONCENTRATED (HUMULIN R) 500 UNIT/ML injection 100 Units (has  no administration in time range)  pravastatin (PRAVACHOL) tablet 80 mg (80 mg Oral Given 09/08/18 0013)  sodium chloride 0.9 % bolus 1,000 mL (0 mLs Intravenous Stopped 09/07/18 1841)  sodium chloride 0.9 % bolus 1,000 mL (0 mLs Intravenous Stopped 09/07/18 2013)    And  sodium chloride 0.9 % bolus 400 mL (0 mLs Intravenous Stopped 09/07/18 1955)    Mobility walks

## 2018-09-08 NOTE — Progress Notes (Signed)
  Echocardiogram 2D Echocardiogram has been performed.  Madelaine Etienne 09/08/2018, 2:13 PM

## 2018-09-08 NOTE — Progress Notes (Signed)
On call provider [facility]) notified of critical troponin of 0.26 (up from 0.22 and 0.11 before that). Pt asymptomatic. EKG done in ED. That was last troponin ordered in the trend. Awaiting orders/call back. Will continue to monitor.

## 2018-09-09 DIAGNOSIS — L03115 Cellulitis of right lower limb: Secondary | ICD-10-CM

## 2018-09-09 LAB — COMPREHENSIVE METABOLIC PANEL
ALK PHOS: 74 U/L (ref 38–126)
ALT: 72 U/L — ABNORMAL HIGH (ref 0–44)
AST: 68 U/L — ABNORMAL HIGH (ref 15–41)
Albumin: 3.6 g/dL (ref 3.5–5.0)
Anion gap: 10 (ref 5–15)
BUN: 33 mg/dL — ABNORMAL HIGH (ref 8–23)
CALCIUM: 9.1 mg/dL (ref 8.9–10.3)
CO2: 22 mmol/L (ref 22–32)
Chloride: 103 mmol/L (ref 98–111)
Creatinine, Ser: 1.48 mg/dL — ABNORMAL HIGH (ref 0.61–1.24)
GFR calc Af Amer: 52 mL/min — ABNORMAL LOW (ref >60)
GFR calc non Af Amer: 45 mL/min — ABNORMAL LOW (ref >60)
Glucose, Bld: 137 mg/dL — ABNORMAL HIGH (ref 70–99)
Potassium: 4.1 mmol/L (ref 3.5–5.1)
SODIUM: 135 mmol/L (ref 135–145)
Total Bilirubin: 1.4 mg/dL — ABNORMAL HIGH (ref 0.3–1.2)
Total Protein: 7.1 g/dL (ref 6.5–8.1)

## 2018-09-09 LAB — CBC
HCT: 37.6 % — ABNORMAL LOW (ref 39.0–52.0)
Hemoglobin: 11.5 g/dL — ABNORMAL LOW (ref 13.0–17.0)
MCH: 28.8 pg (ref 26.0–34.0)
MCHC: 30.6 g/dL (ref 30.0–36.0)
MCV: 94 fL (ref 80.0–100.0)
Platelets: 59 10*3/uL — ABNORMAL LOW (ref 150–400)
RBC: 4 MIL/uL — ABNORMAL LOW (ref 4.22–5.81)
RDW: 19.3 % — ABNORMAL HIGH (ref 11.5–15.5)
WBC: 8.6 10*3/uL (ref 4.0–10.5)
nRBC: 0 % (ref 0.0–0.2)

## 2018-09-09 LAB — GLUCOSE, CAPILLARY
Glucose-Capillary: 124 mg/dL — ABNORMAL HIGH (ref 70–99)
Glucose-Capillary: 200 mg/dL — ABNORMAL HIGH (ref 70–99)
Glucose-Capillary: 198 mg/dL — ABNORMAL HIGH (ref 70–99)
Glucose-Capillary: 304 mg/dL — ABNORMAL HIGH (ref 70–99)

## 2018-09-09 MED ORDER — CEPHALEXIN 500 MG PO CAPS: 500.0000 mg | ORAL_CAPSULE | Freq: Four times a day (QID) | ORAL | 0 refills | Status: AC

## 2018-09-09 MED ORDER — CEPHALEXIN 500 MG PO CAPS: 500.0000 mg | ORAL_CAPSULE | Freq: Four times a day (QID) | ORAL | 0 refills | Status: DC

## 2018-09-09 MED ORDER — CEFAZOLIN SODIUM-DEXTROSE 2-4 GM/100ML-% IV SOLN
2.0000 g | Freq: Three times a day (TID) | INTRAVENOUS | Status: DC
  Filled 2018-09-09: qty 100

## 2018-09-09 NOTE — Discharge Summary (Signed)
Physician Discharge Summary  Robert Gregory XBM:[id number] DOB: [birth year] DOA: 09/07/2018  PCP: Seward Carol, MD  Admit date: 09/07/2018 Discharge date: 09/09/2018  Time spent: 35 minutes  Recommendations for Outpatient Follow-up:  1. Follow up outpatient CBC/CMP 2. Follow up RLE cellulitis as outpatient 3. Follow up fatigue/malaise as outpatient, ensure this continues to improve 4. Follow up volume status and timing of resumption of lasix/spironolactone 5. He had elevated troponin, but not consistent with ACS, encouraged to follow up with cardiology outpatient 6. Chronically elevated liver enzymes, continue to follow outpatient.  Follow acute hepatitis panel. 7. Follow final cultures  Discharge Diagnoses:  Active Problems:   Sepsis [facility])   Discharge Condition: stable  Diet recommendation: heart healthy  Filed Weights   09/07/18 1611 09/08/18 0245 09/08/18 0531  Weight: (!) 140.6 kg (!) 147.8 kg (!) 147.2 kg    History of present illness:  ThomasPikeis a 77 y.o.malewith a known history of atrial fibrillation on Xarelto, diabetes, coronary artery disease with AICD, congestive heart failure, hypertension, hyperlipidemia, chronic iron deficiency anemia/pernicious anemia obstructive sleep apneapresents to the emergency department for evaluation of multiple complaints including weakness, nausea, vomiting, fevers to 100.2 today,shortness of breath.He also reports decreased PO intake.Patient was in a usual state of health until the past month or so when he reports worsening weakness and fatigue. His hematologist recently started him on iron infusions and B12 injections with minimal improvement in his symptoms. He came to the ER today because of worsening fatigue over the past 24 hours. His wife noticed the onset of redness of his right lower extremity this morning.   He was admitted for sepsis 2/2 right lower extremity cellulitis.  He improved with IV antibiotics.  He also  had significant fatigue and malaise.  These symptoms were improved on the day of discharge as well.  He was discharged with keflex and home health.  Encouraged to follow up with his PCP.  See below for further details.   Hospital Course:  #.Sepsis secondary toRLE cellulitis - IV antibiotics:Rocephin -> ancef (12/20 - 21).  Discharged on keflex. - IV fluid hydration - Blood cx NGTD  - Check doppler of RLE to rule out DVT (negative for dVT)  # Generalized Malaise: he'd been getting B12 injections and iron infusions with [name], but possibly related to infection above.   Follow TSH, follow response to abx  #.Acute kidney injury  - Improved on day of discharge - Creatinine peaked at 2.0, improving, but still not to baseline (1.32) - Avoid nephrotoxic medications  #. Elevated Troponin  Hx CAD s/p CABG:  - He's not complained of any chest pain - Troponins are flat and EKG appears similar to priors (possible new isolated T wave in V2) - Echo with normal EF, but poor quality, unable to assess for WMA - Follow up with cards outpatient (discussed with cards who agreed)  #.History of atrial fibrillation -Continue Xarelto, Coreg  #. History ofhypertension/ CHF - Continuecoreg.  Holding lasix, spironolactone, potassium at discharge.  #. History ofhyperlipidemia - ContinueZetia, pravastatin, TriCor  #. History ofdiabetes -resume home meds  #.Iron Deficiency Anemia  Pernicious Anemia  Chronic Thrombocytopenia 2/2 Splenomegaly:  Follows with [name], recently received iron infusion/B12 injections Continue to monitor daily CBC Monitor CBC  # Elevated LFTs: follow acute hepatitis panel, follow CMP.  Continue to follow outpatient.  This appears chronic.   Procedures: Study Conclusions  - Left ventricle: The cavity size was normal. Wall thickness was   normal. Systolic  function was normal. The estimated ejection   fraction was in the range of 55% to  60%. Wall motion was normal;   there were no regional wall motion abnormalities. The study is   not technically sufficient to allow evaluation of LV diastolic   function.  Impressions:  - Extremely limited; definity used; overall normal LV function;   cannot R/O wall motion abnormality.  LE [place] Summary: Right: There is no evidence of deep vein thrombosis in the lower extremity. However, portions of this examination were limited- see technologist comments above. No cystic structure found in the popliteal fossa. Left: No evidence of common femoral vein obstruction.  Consultations:  none  Discharge Exam: Vitals:   09/09/18 0503 09/09/18 1257  BP: 135/66 130/66  Pulse: 80 76  Resp: 20   Temp: 97.6 F (36.4 C) 98 F (36.7 C)  SpO2: 98% 100%   Malaise/fatigue is better.  Saw pt in AM, discussed potential discharge today given improvement.  Wanted to think about it.  When I returned in afternoon, he felt he'd improved enough to go home. Feels he's improving.  General: No acute distress. Cardiovascular: Heart sounds show a regular rate, and rhythm Lungs: Clear to auscultation bilaterally Abdomen: Soft, nontender, nondistended Neurological: Alert and oriented 3. Moves all extremities 4. Cranial nerves II through XII grossly intact. Skin: RLE with erythema and edema, remains within borders previously drawn Extremities: No clubbing or cyanosis. No edema. Pedal pulses 2+. Psychiatric: Mood and affect are normal. Insight and judgment are appropriate.  Discharge Instructions   Discharge Instructions    Call MD for:  difficulty breathing, headache or visual disturbances   Complete by:  As directed    Call MD for:  extreme fatigue   Complete by:  As directed    Call MD for:  hives   Complete by:  As directed    Call MD for:  persistant dizziness or light-headedness   Complete by:  As directed    Call MD for:  persistant nausea and vomiting   Complete by:  As directed     Call MD for:  redness, tenderness, or signs of infection (pain, swelling, redness, odor or green/yellow discharge around incision site)   Complete by:  As directed    Call MD for:  severe uncontrolled pain   Complete by:  As directed    Call MD for:  temperature >100.4   Complete by:  As directed    Diet - low sodium heart healthy   Complete by:  As directed    Discharge instructions   Complete by:  As directed    You were seen for cellulitis.  You've improved on antibiotics.  We'll discharge you with 5 days of keflex (take it 4 times a day).  Please hold your lasix and spironolactone until you follow up with Dr. Delfina Redwood.  Stop your potassium as well.  Please follow up with cardiology as an outpatient for your elevated troponin seen here.   Follow up with Dr. Delfina Redwood and [name].  Return for new, recurrent, or worsening symptoms.  Please ask your PCP to request records from this hospitalization so they know what was done and what the next steps will be.   Increase activity slowly   Complete by:  As directed      Allergies as of 09/09/2018      Reactions   Iodine    flushing   Seasonal Ic [cholestatin]    Tape Other (See Comments)  Rash  Rash    Contrast Media [iodinated Diagnostic Agents] Rash, Other (See Comments)   flushing EXTREME FLUSHING    Ioxaglate Other (See Comments), Rash   flushing EXTREME FLUSHING       Medication List    STOP taking these medications   furosemide 40 MG tablet Commonly known as:  LASIX   KLOR-CON 10 10 MEQ tablet Generic drug:  potassium chloride   spironolactone 25 MG tablet Commonly known as:  ALDACTONE     TAKE these medications   amoxicillin 500 MG capsule Commonly known as:  AMOXIL Take 500 mg by mouth as directed. 6 prior to dental work   BD INSULIN SYRINGE U-500 31G X 6MM 0.5 ML Misc Generic drug:  Insulin Syringe/Needle U-500 USE 1 SYRINGE TO INJECT UNDER THE SKIN THREE TIMES A DAY BEFORE MEALS   BENICAR 40 MG  tablet Generic drug:  olmesartan TAKE 1 TABLET TWICE A DAY   carvedilol 6.25 MG tablet Commonly known as:  COREG Take 1 tablet (6.25 mg total) by mouth 2 (two) times daily with a meal.   CENTRUM SILVER PO Take 1 tablet by mouth daily.   cephALEXin 500 MG capsule Commonly known as:  KEFLEX Take 1 capsule (500 mg total) by mouth 4 (four) times daily for 5 days.   Coenzyme Q10 200 MG Tabs Take 1 tablet by mouth daily.   Cranberry 200 MG Caps Take 200 mg by mouth 2 (two) times a week. Take on Tues & Thurs   ezetimibe 10 MG tablet Commonly known as:  ZETIA TAKE 1 TABLET DAILY   IBGARD 90 MG Cpcr Generic drug:  Peppermint Oil Take 90 mg by mouth 2 (two) times daily.   insulin regular human CONCENTRATED 500 UNIT/ML injection Commonly known as:  HUMULIN R Inject up to 200 units a day as advised What changed:    how much to take  how to take this  when to take this  additional instructions   metFORMIN 500 MG 24 hr tablet Commonly known as:  GLUCOPHAGE-XR TAKE 4 TABLETS (2000 MG) WITH DINNER What changed:  See the new instructions.   nitroGLYCERIN 0.4 MG SL tablet Commonly known as:  NITROSTAT Place 1 tablet (0.4 mg total) under the tongue every 5 (five) minutes as needed for chest pain.   NON FORMULARY Take 1 tablet by mouth daily. Methyfolate 2000 MCG daily   omega-3 acid ethyl esters 1 g capsule Commonly known as:  LOVAZA TAKE 2 CAPSULES TWICE A DAY What changed:    how much to take  how to take this  when to take this   pravastatin 80 MG tablet Commonly known as:  PRAVACHOL TAKE 1 TABLET DAILY   PROBIOTIC DAILY PO Take 1 tablet by mouth daily.   TRICOR 48 MG tablet Generic drug:  fenofibrate TAKE 1 TABLET DAILY What changed:  how much to take   Vitamin D 50 MCG (2000 UT) tablet Take 2,000 Units by mouth daily.   XARELTO 20 MG Tabs tablet Generic drug:  rivaroxaban TAKE 1 TABLET DAILY WITH SUPPER What changed:  See the new instructions.       Allergies  Allergen Reactions  . Iodine     flushing  . Seasonal Ic [Cholestatin]   . Tape Other (See Comments)    Rash  Rash   . Contrast Media [Iodinated Diagnostic Agents] Rash and Other (See Comments)    flushing EXTREME FLUSHING   . Ioxaglate Other (See Comments) and Rash  flushing EXTREME FLUSHING    Follow-up Information    [facility].   Specialty:  Home Health Services Why:  Home Health RN and Physical Therapy- agency will call to arrange appointment Contact information: [address] [place] [address] [phone]        Seward Carol, MD Follow up.   Specialty:  Internal Medicine Contact information: [address] [address] [phone]        [name], MD Follow up.   Specialty:  Oncology Contact information: [place] [address] [phone]            The results of significant diagnostics from this hospitalization (including imaging, microbiology, ancillary and laboratory) are listed below for reference.    Significant Diagnostic Studies: US Abdomen Limited  Result Date: 08/28/2018 CLINICAL DATA:  77 year old male with cirrhosis.  Query ascites. EXAM: LIMITED ABDOMEN ULTRASOUND FOR ASCITES TECHNIQUE: Limited ultrasound survey for ascites was performed in all four abdominal quadrants. COMPARISON:  CT Abdomen and Pelvis 05/26/2018 and earlier. FINDINGS: Grayscale images of the midline and all four quadrants demonstrate no free fluid. IMPRESSION: Negative, no ascites. Electronically Signed   By: Genevie Ann M.D.   On: 08/28/2018 12:07   Dg Chest Port 1 View  Result Date: 09/07/2018 CLINICAL DATA:  Weakness EXAM: PORTABLE CHEST 1 VIEW COMPARISON:  08/31/2016 FINDINGS: Post sternotomy changes. Left-sided pacing device as before. Borderline cardiomegaly. No focal airspace disease. Aortic atherosclerosis. No pneumothorax. IMPRESSION:  Borderline to mild cardiomegaly with slight central congestion. Negative for pleural effusion or focal airspace disease Electronically Signed   By: Donavan Foil M.D.   On: 09/07/2018 17:07   Vas [place] Lower Extremity Venous (dvt)  Result Date: 09/08/2018  Lower Venous Study Indications: Swelling.  Limitations: Body habitus and poor ultrasound/tissue interface. Performing Technologist: Maudry Mayhew MHA, RDMS, RVT, RDCS  Examination Guidelines: A complete evaluation includes B-mode imaging, spectral Doppler, color Doppler, and power Doppler as needed of all accessible portions of each vessel. Bilateral testing is considered an integral part of a complete examination. Limited examinations for reoccurring indications may be performed as noted.  Right Venous Findings: +---------+---------------+---------+-----------+----------+------------------+          CompressibilityPhasicitySpontaneityPropertiesSummary            +---------+---------------+---------+-----------+----------+------------------+ CFV      Full           Yes      Yes                                     +---------+---------------+---------+-----------+----------+------------------+ SFJ      Full                                                            +---------+---------------+---------+-----------+----------+------------------+ FV Prox  Full                                                            +---------+---------------+---------+-----------+----------+------------------+ FV Mid   Full                                                            +---------+---------------+---------+-----------+----------+------------------+  FV DistalFull                                                            +---------+---------------+---------+-----------+----------+------------------+ PFV      Full                                                             +---------+---------------+---------+-----------+----------+------------------+ POP      Full           Yes      Yes                                     +---------+---------------+---------+-----------+----------+------------------+ PTV      Full                                         Limited evaluation +---------+---------------+---------+-----------+----------+------------------+ PERO     Full                                         Limited evaluation +---------+---------------+---------+-----------+----------+------------------+  Left Venous Findings: +---+---------------+---------+-----------+----------+-------+    CompressibilityPhasicitySpontaneityPropertiesSummary +---+---------------+---------+-----------+----------+-------+ CFVFull           Yes      Yes                          +---+---------------+---------+-----------+----------+-------+    Summary: Right: There is no evidence of deep vein thrombosis in the lower extremity. However, portions of this examination were limited- see technologist comments above. No cystic structure found in the popliteal fossa. Left: No evidence of common femoral vein obstruction.  *See table(s) above for measurements and observations. Electronically signed by Monica Martinez MD on 09/08/2018 at 12:23:56 PM.    Final     Microbiology: Recent Results (from the past 240 hour(s))  Culture, blood (routine x 2)     Status: None (Preliminary result)   Collection Time: 09/07/18  4:45 PM  Result Value Ref Range Status   Specimen Description   Final    BLOOD RIGHT ANTECUBITAL Performed at [facility], [place] [address] [place], [place] [address]    Special Requests   Final    BOTTLES DRAWN AEROBIC AND ANAEROBIC Blood Culture adequate volume Performed at [facility] [address] [place], [place] [address]    Culture   Final    NO GROWTH 2 DAYS Performed at [facility]  [address], [place], [place] [address]    Report Status PENDING  Incomplete  Culture, blood (routine x 2)     Status: None (Preliminary result)   Collection Time: 09/08/18  4:33 AM  Result Value Ref Range Status   Specimen Description   Final    BLOOD LEFT ANTECUBITAL Performed at [facility] [address] [place], [place] [address]  Special Requests   Final    BOTTLES DRAWN AEROBIC AND ANAEROBIC Blood Culture adequate volume Performed at [facility] [address], [place], [place] [address]    Culture   Final    NO GROWTH 1 DAY Performed at [facility], [place] [address], [place], [place] [address]    Report Status PENDING  Incomplete     Labs: Basic Metabolic Panel: Recent Labs  Lab 09/07/18 1730 09/07/18 2144 09/08/18 0433 09/09/18 0753  NA 137  --  136 135  K 4.5  --  4.3 4.1  CL 101  --  105 103  CO2 22  --  20* 22  GLUCOSE 170*  --  132* 137*  BUN 39*  --  38* 33*  CREATININE 2.00*  --  1.74* 1.48*  CALCIUM 9.9  --  8.7* 9.1  MG  --  2.1  --   --   PHOS  --  2.2*  --   --    Liver Function Tests: Recent Labs  Lab 09/07/18 1730 09/08/18 0433 09/09/18 0753  AST 68* 66* 68*  ALT 81* 70* 72*  ALKPHOS 83 65 74  BILITOT 1.6* 1.4* 1.4*  PROT 7.6 6.2* 7.1  ALBUMIN 4.0 3.3* 3.6   Recent Labs  Lab 09/07/18 1730  LIPASE 33   No results for input(s): AMMONIA in the last 168 hours. CBC: Recent Labs  Lab 09/07/18 1730 09/08/18 0433 09/09/18 0753  WBC 14.1* 9.1 8.6  NEUTROABS 12.2*  --   --   HGB 12.3* 10.3* 11.5*  HCT 41.5 33.5* 37.6*  MCV 97.2 93.6 94.0  PLT 74* 60* 59*   Cardiac Enzymes: Recent Labs  Lab 09/08/18 0216 09/08/18 0545 09/08/18 1200  TROPONINI 0.22* 0.26* 0.23*   BNP: BNP (last 3 results) No results for input(s): BNP in the last 8760 hours.  ProBNP (last 3 results) No results for input(s): PROBNP in the last 8760 hours.  CBG: Recent Labs  Lab 09/08/18 2050 09/09/18 0748  09/09/18 1011 09/09/18 1201 09/09/18 1622  GLUCAP 157* 124* 200* 198* 304*       Signed:  Fayrene Helper MD.  [facility] 09/09/2018, 5:14 PM

## 2018-09-09 NOTE — Care Management Note (Signed)
Case Management Note  Patient Details  Name: Robert Gregory MRN: [id number] Date of Birth: 1940-12-20  Subjective/Objective:   Sepsis s/t RLE cellulitis, AKI                 Action/Plan:  Spoke to pt and wife at bedside. Offered choice for HH/CMS list provided and placed on chart. Pt requested [facility] for HH. Contacted [facility] with new referral.    Expected Discharge Date:  09/09/18               Expected Discharge Plan:  [place]  In-House Referral:  NA  Discharge planning Services  CM Consult  Post Acute Care Choice:  Home Health Choice offered to:  Patient  DME Arranged:  N/A DME Agency:  NA  HH Arranged:  PT, RN [place] Agency:  [facility] [place]  Status of Service:  Completed, signed off  If discussed at [place] of Stay Meetings, dates discussed:    Additional Comments:  Erenest Rasher, RN 09/09/2018, 5:50 PM

## 2018-09-10 DIAGNOSIS — L03115 Cellulitis of right lower limb: Secondary | ICD-10-CM

## 2018-09-11 ENCOUNTER — Encounter: Payer: Self-pay | Admitting: Hematology & Oncology

## 2018-09-11 LAB — HEPATITIS PANEL, ACUTE
HCV Ab: 0.1 s/co ratio (ref 0.0–0.9)
HEP A IGM: NEGATIVE
Hep B C IgM: NEGATIVE
Hepatitis B Surface Ag: NEGATIVE

## 2018-09-12 ENCOUNTER — Inpatient Hospital Stay: Payer: Medicare Other

## 2018-09-12 LAB — CULTURE, BLOOD (ROUTINE X 2)
Culture: NO GROWTH
SPECIAL REQUESTS: ADEQUATE

## 2018-09-13 ENCOUNTER — Encounter: Payer: Self-pay | Admitting: Internal Medicine

## 2018-09-13 LAB — CULTURE, BLOOD (ROUTINE X 2)
CULTURE: NO GROWTH
Special Requests: ADEQUATE

## 2018-09-15 ENCOUNTER — Encounter: Payer: Self-pay | Admitting: Internal Medicine

## 2018-09-15 ENCOUNTER — Ambulatory Visit (INDEPENDENT_AMBULATORY_CARE_PROVIDER_SITE_OTHER): Payer: Medicare Other | Admitting: Internal Medicine

## 2018-09-15 VITALS — BP 142/80 | HR 78 | Ht 73.0 in | Wt 335.0 lb

## 2018-09-15 DIAGNOSIS — Z794 Long term (current) use of insulin: Secondary | ICD-10-CM | POA: Diagnosis not present

## 2018-09-15 DIAGNOSIS — N183 Chronic kidney disease, stage 3 unspecified: Secondary | ICD-10-CM

## 2018-09-15 DIAGNOSIS — I255 Ischemic cardiomyopathy: Secondary | ICD-10-CM | POA: Diagnosis not present

## 2018-09-15 DIAGNOSIS — E1122 Type 2 diabetes mellitus with diabetic chronic kidney disease: Secondary | ICD-10-CM | POA: Diagnosis not present

## 2018-09-15 DIAGNOSIS — E66813 Obesity, class 3: Secondary | ICD-10-CM

## 2018-09-15 DIAGNOSIS — E7800 Pure hypercholesterolemia, unspecified: Secondary | ICD-10-CM

## 2018-09-15 MED ORDER — METFORMIN HCL ER 500 MG PO TB24: ORAL_TABLET | ORAL | 3 refills | Status: DC

## 2018-09-15 NOTE — Progress Notes (Signed)
Patient ID: Robert Gregory, male   DOB: [birth year], 77 y.o.   MRN: [id number]  HPI: Robert Gregory is a 77 y.o.-year-old male, returning for f/u for DM2, dx in 2004, insulin-dependent, uncontrolled, with complications (CAD - Ischemic CMP, s/p CABG x5 in 1997, CHF; CKD stage 3). Last visit 3 weeks ago.  Patient returns sooner than the scheduled appointment after hospitalization for cellulitis.  Sugars are more stable after treating his infection.  Before last visit he had problems with mild low blood sugars (he feels sugars lower than 120), especially in the evening and during the night.  He is now off Lantus and is not taking U500 insulin before dinner.  Last hemoglobin A1c was: Lab Results  Component Value Date   HGBA1C 7.0 (A) 08/24/2018   HGBA1C 7.8 (A) 05/15/2018   HGBA1C 8.0 (A) 02/17/2018   Patient is on: - Metformin ER 2000 mg with dinner - stopped in the hospital - Lantus 30 >> 20 units at bedtime >> off Lantus now - U500 insulin: -125 units before breakfast >> 140 >> 150 units -90 units before lunch  >> 100 >> 70 units -25 units before dinner >> now off He was on Cinnamon 500 mg 2x a day He gained 50 lbs with Actos in the past.  He was on Invokana 100 mg in am in the past. We stopped Bydureon at last visit Gregory/Gregory severe constipation >> this resolved.  Pt checks his sugars 4-8 times a day-reviewing his excellent log - am:130, 162-204, 230, 375 >> 71, 85-287 >> 78-173 - 2h after b'fast: 131-219, 396 >> 141-254 >> 134, 261 - before lunch: 189-388 >> 134, 178-284 >> 133 - 2h after lunch: 277-369 >> n/c >> 222-329 >> 229, 257 - before dinner: 127-298 >> 83-364 >> 108-322 >>136-192, 303 - 2h after dinner: 96-242 >> 134-255, 490 (TxGiving) >> 98-210 - bedtime:  115-192 >> 70-287 >> 96-230, 336 >> 141-235, 301 (Christmas0 - nighttime:  132 >> 67--158 >> 84-319 >> 120-183 Lowest sugar: 71 >> 78; he has hypoglycemia awareness in the low 100s. Highest sugar: 490 >> 324.  Glucometer:  Robert Gregory  Pt's meals are: - Breakfast: cereals + Gregory% milk, sausage + eggs + toast; still OJ - Lunch: soup + sandwich (lightest) - Dinner: meat + veggies + starch  - Snacks: 1- pm: peanuts, V8 juice  -+ CKD, last BUN/creatinine:  Lab Results  Component Value Date   BUN 33 (H) 09/09/2018   CREATININE 1.48 (H) 09/09/2018  On Benicar.  Reviewed his GFR levels: Lab Results  Component Value Date   GFRNONAA 45 (L) 09/09/2018   GFRNONAA 37 (L) 09/08/2018   GFRNONAA 31 (L) 09/07/2018   GFRNONAA 52 (L) 08/16/2018   GFRNONAA 38 (L) 05/25/2018   GFRNONAA 38 (L) 09/26/2017   GFRNONAA 61 (L) 12/01/2014   GFRNONAA 43 (L) 11/30/2014   GFRNONAA 49 (L) 11/19/2014   GFRNONAA 56 (L) 09/18/2014   -+ HL; last set of lipids: 07/2018: tChol 115 Lab Results  Component Value Date   CHOL 123 08/03/2017   HDL 26 (L) 08/03/2017   LDLCALC 26 08/03/2017   TRIG 355 (H) 08/03/2017   CHOLHDL 4.7 08/03/2017  On pravastatin 80, generic Lovaza. - last eye exam was in 12/2017: No DR -No numbness and tingling in his feet.  He has a history of a right diabetic foot ulcer developed after removal of a callus >> followed by Dr. Zigmund Gregory (Wound Care).  This has healed.  He  started iron IV and B12 im.  He was advised to stop Spironolactone and Lasix in the hospital.  He is still off, bending recommendations from his cardiologist.  ROS: Constitutional: no weight gain/no weight loss, no fatigue, + subjective hyperthermia, no subjective hypothermia, + nocturia Eyes: + Blurry vision, no xerophthalmia ENT: no sore throat, no nodules palpated in neck, no dysphagia, no odynophagia, no hoarseness Cardiovascular: no CP/+ SOB/no palpitations/+ leg swelling Respiratory: no cough/+ SOB/+ wheezing Gastrointestinal: + N/+ V/no D/no C/no acid reflux Musculoskeletal: no muscle aches/no joint aches Skin: + Rash right leg, no hair loss Neurological: + Tremors/no numbness/no tingling/no dizziness  I reviewed pt's  medications, allergies, PMH, social hx, family hx, and changes were documented in the history of present illness. Otherwise, unchanged from my initial visit note.  Past Medical History:  Diagnosis Date  . Arthritis   . Atrial fibrillation ([place])    takes Xarelto daily  . CAD (coronary artery disease)   . Cardiomyopathy, ischemic Gregory/18/2015  . Cholelithiasis   . Chronic back pain    scoliosis/arthritis  . Complication of anesthesia    forgetful for about Gregory-3wks after anesthesia  . Congestive heart failure ([place])    takes Spironolactone daily  . Diabetes mellitus ([place])    takes Invokamet daily as well as Hum U  . History of colon polyps   . History of kidney stones   . Hyperlipidemia    takes [employer] daily  . Hyperlipidemia Gregory/18/2015  . Hypertension    takes Benicar and Coreg daily  . ICD (implantable cardioverter-defibrillator) battery depletion   . ICD (implantable cardioverter-defibrillator) in place Gregory/18/2015  . Iron deficiency anemia due to chronic blood loss 08/24/2018  . Iron malabsorption 08/24/2018  . Ischemic cardiomyopathy   . Ischemic cardiomyopathy   . Joint pain   . Myocardial infarction ([place]) 1997   on the OR table   . Nephrolithiasis   . Obstructive sleep apnea   . Peripheral neuropathy   . Pernicious anemia 08/24/2018  . Pneumonia    as a child  . Spinal stenosis   . Thrombocytopenia ([place])   . Total knee replacement status 11/29/2014  . Type Gregory diabetes mellitus with stage 3 chronic kidney disease ([place]) 05/19/2015  . Urinary frequency   . Urinary urgency    Past Surgical History:  Procedure Laterality Date  . CARDIAC CATHETERIZATION N/A 10/02/2015   Procedure: Left Heart Cath and Cors/Grafts Angiography;  Surgeon: Belva Crome, MD;  Location: [facility];  Service: Cardiovascular;  Laterality: N/A;  . COLONOSCOPY    . coronary artery bypass and graft  1997   x 5  . ESOPHAGOGASTRODUODENOSCOPY    . ICD placed    . LAMINECTOMY     . LITHOTRIPSY    . Right rotator cuff surgery    . TONSILLECTOMY     adenoids  . TOTAL KNEE ARTHROPLASTY Left   . TOTAL KNEE ARTHROPLASTY Right 11/29/2014   Procedure: RIGHT TOTAL KNEE ARTHROPLASTY;  Surgeon: Leandrew Koyanagi, MD;  Location: [facility];  Service: Orthopedics;  Laterality: Right;   Social History   Social History  . Marital status: Married    Spouse name: N/A  . Number of children: N/A   Occupational History  .      Retired from [employer]  . Smoking status: Never Smoker  . Smokeless tobacco: Never Used  . Alcohol use 0.0 oz/week     Comment: once drink  a month  . Drug use: No   Current Outpatient Medications on File Prior to Visit  Medication Sig Dispense Refill  . amoxicillin (AMOXIL) 500 MG capsule Take 500 mg by mouth as directed. 6 prior to dental work    . BD INSULIN SYRINGE U-500 31G X 6MM 0.5 ML MISC USE 1 SYRINGE TO INJECT UNDER THE SKIN THREE TIMES A DAY BEFORE MEALS 300 each 3  . BENICAR 40 MG tablet TAKE 1 TABLET TWICE A DAY 180 tablet 3  . carvedilol (COREG) 6.25 MG tablet Take 1 tablet (6.25 mg total) by mouth Gregory (two) times daily with a meal. 90 tablet 3  . Cholecalciferol (VITAMIN D) 50 MCG (2000 UT) tablet Take Gregory,000 Units by mouth daily.    . Coenzyme Q10 200 MG TABS Take 1 tablet by mouth daily.    . Cranberry 200 MG CAPS Take 200 mg by mouth Gregory (two) times a week. Take on Tues & Thurs    . ezetimibe (ZETIA) 10 MG tablet TAKE 1 TABLET DAILY (Patient taking differently: Take 10 mg by mouth daily. ) 90 tablet 4  . insulin regular human CONCENTRATED (HUMULIN R) 500 UNIT/ML injection Inject up to 200 units a day as advised (Patient taking differently: Inject 100-140 Units into the skin Gregory (two) times daily with a meal. Take 140 units at breakfast and Take 100 units at lunch.) 3 vial 3  . metFORMIN (GLUCOPHAGE-XR) 500 MG 24 hr tablet TAKE 4 TABLETS (2000 MG) WITH DINNER (Patient taking differently: Take Gregory,000 mg by mouth every  evening. TAKE 4 TABLETS (2000 MG) WITH DINNER) 360 tablet 4  . Multiple Vitamins-Minerals (CENTRUM SILVER PO) Take 1 tablet by mouth daily.    . nitroGLYCERIN (NITROSTAT) 0.4 MG SL tablet Place 1 tablet (0.4 mg total) under the tongue every 5 (five) minutes as needed for chest pain. 75 tablet 3  . NON FORMULARY Take 1 tablet by mouth daily. Methyfolate 2000 MCG daily     . omega-3 acid ethyl esters (LOVAZA) 1 g capsule TAKE Gregory CAPSULES TWICE A DAY (Patient taking differently: Take Gregory g by mouth Gregory (two) times daily. TAKE Gregory CAPSULES TWICE A DAY) 360 capsule 1  . Peppermint Oil (IBGARD) 90 MG CPCR Take 90 mg by mouth Gregory (two) times daily.    . pravastatin (PRAVACHOL) 80 MG tablet TAKE 1 TABLET DAILY (Patient taking differently: Take 80 mg by mouth daily. ) 90 tablet 3  . Probiotic Product (PROBIOTIC DAILY PO) Take 1 tablet by mouth daily.    . TRICOR 48 MG tablet TAKE 1 TABLET DAILY (Patient taking differently: Take 48 mg by mouth daily. ) 90 tablet 4  . XARELTO 20 MG TABS tablet TAKE 1 TABLET DAILY WITH SUPPER (Patient taking differently: Take 20 mg by mouth every evening. ) 90 tablet 3   No current facility-administered medications on file prior to visit.    Allergies  Allergen Reactions  . Iodine     flushing  . Seasonal Ic [Cholestatin]   . Tape Other (See Comments)    Rash  Rash   . Contrast Media [Iodinated Diagnostic Agents] Rash and Other (See Comments)    flushing EXTREME FLUSHING   . Ioxaglate Other (See Comments) and Rash    flushing EXTREME FLUSHING    Family History  Problem Relation Age of Onset  . Heart disease Other        No family history   PE: There were no vitals taken for this visit.  There is no height or weight on file to calculate BMI. Wt Readings from Last 3 Encounters:  09/08/18 (!) 324 lb 8.3 oz (147.Gregory kg)  08/29/18 (!) 326 lb 8 oz (148.1 kg)  08/28/18 (!) 326 lb 8 oz (148.1 kg)   Constitutional: overweight, in NAD Eyes: PERRLA, EOMI, no exophthalmos ENT:  moist mucous membranes, no thyromegaly, no cervical lymphadenopathy Cardiovascular: RRR, No MRG, + bilateral LE edema Respiratory: CTA B Gastrointestinal: abdomen soft, NT, ND, BS+ Musculoskeletal: no deformities, strength intact in all 4 Skin: moist, warm, no rashes Neurological: + Mild tremor with outstretched hands, DTR normal in all 4  ASSESSMENT: 1. DM2, insulin-dependent, uncontrolled, with complications - CAD, s/p CABG 5x in 1997 (Dr Roxy Horseman), iCMP >> CHF (Dr Stanford Breed) - CKD stage 3   Gregory. Obesity class 3  3. HL  PLAN:  1. Patient with longstanding, uncontrolled, type Gregory diabetes, very insulin resistant, on metformin and U500 concentrated insulin.  As he was dropping his sugars in the evening and during the night, we stopped Lantus at bedtime and we also stopped his U500 insulin dose with dinner.  We increased his a.m. U500 insulin dose and decrease his lunchtime dose at last visit -He was previously on a GLP-1 receptor agonist which we had to stop due to severe constipation and also on an SGLT2 inhibitor which we had to stop due to dehydration and increased GFR. -He was recently hospitalized for cellulitis and was on IV antibiotics.  Sugars are more stable after resolution of his infection, despite the fact that he did stop metformin while in the hospital -At this visit, we discussed about whether we need to add back or not metformin.  I recommended to add a lower dose, 1000 mg with lunch.  He agrees to restart this. -I would not suggest to change his current U500 insulin doses.  His diabetes control is not at equilibrium yet, being just discharged from the hospital less than a week ago and also during the holidays.  I advised him to keep an eye on his sugars and let me know if they do not improve further in the next Gregory to 3 weeks. - I advised him to:  Patient Instructions  Please continue: - U500 insulin: -150 units before breakfast -70 units before lunch  If the sugars increase  after dinner, may need to add 15 units of U500 before this meal  Please add back: - Metformin ER 1000 mg with lunch  Continue to hold Lantus.  Please return in 3 months with your sugar log.  - continue checking sugars at different times of the day - check 4x a day, rotating checks - advised for yearly eye exams >> he is UTD - Return to clinic in 3 mo with sugar log      Gregory. Obesity class 3 -Refused gastric bypass surgery -We could not continue GLP-1 receptor agonist due to severe constipation and we also had to stop SGLT Gregory inhibitor due to dehydration and lower GFR -Before last visit he started to reduce a.m. snacks and he lost 5 pounds in 3 months. -No significant weight loss since last visit, however, probably due to the holidays -We will add back metformin which should help with weight stabilization  3. HL - Reviewed latest lipid panel from a year ago: LDL at goal, triglycerides high, HDL low Lab Results  Component Value Date   CHOL 123 08/03/2017   HDL 26 (L) 08/03/2017   LDLCALC 26 08/03/2017   TRIG  355 (H) 08/03/2017   CHOLHDL 4.7 08/03/2017  - Continues  high-dose pravastatin and generic Lovaza without side effects.   Philemon Kingdom, MD PhD [facility]

## 2018-09-15 NOTE — Patient Instructions (Addendum)
Please continue: - U500 insulin: -150 units before breakfast -70 units before lunch  If the sugars increase after dinner, may need to add 15 units of U500 before this meal  Please add back: - Metformin ER 1000 mg with lunch  Continue to hold Lantus.  Please return in 3 months with your sugar log.

## 2018-09-18 ENCOUNTER — Ambulatory Visit: Payer: Medicare Other | Admitting: Internal Medicine

## 2018-09-18 ENCOUNTER — Other Ambulatory Visit: Payer: Self-pay | Admitting: Internal Medicine

## 2018-09-18 ENCOUNTER — Other Ambulatory Visit: Payer: Self-pay | Admitting: *Deleted

## 2018-09-18 DIAGNOSIS — N183 Chronic kidney disease, stage 3 (moderate): Principal | ICD-10-CM

## 2018-09-18 DIAGNOSIS — Z794 Long term (current) use of insulin: Principal | ICD-10-CM

## 2018-09-18 DIAGNOSIS — I255 Ischemic cardiomyopathy: Secondary | ICD-10-CM

## 2018-09-18 DIAGNOSIS — E1122 Type 2 diabetes mellitus with diabetic chronic kidney disease: Secondary | ICD-10-CM

## 2018-09-19 ENCOUNTER — Inpatient Hospital Stay: Payer: Medicare Other

## 2018-09-19 VITALS — BP 141/49 | HR 83 | Temp 98.0°F | Resp 16

## 2018-09-19 DIAGNOSIS — E611 Iron deficiency: Secondary | ICD-10-CM | POA: Diagnosis not present

## 2018-09-19 DIAGNOSIS — K909 Intestinal malabsorption, unspecified: Secondary | ICD-10-CM

## 2018-09-19 DIAGNOSIS — D6959 Other secondary thrombocytopenia: Secondary | ICD-10-CM | POA: Diagnosis not present

## 2018-09-19 DIAGNOSIS — D510 Vitamin B12 deficiency anemia due to intrinsic factor deficiency: Secondary | ICD-10-CM | POA: Diagnosis not present

## 2018-09-19 DIAGNOSIS — D500 Iron deficiency anemia secondary to blood loss (chronic): Secondary | ICD-10-CM

## 2018-09-19 DIAGNOSIS — R161 Splenomegaly, not elsewhere classified: Secondary | ICD-10-CM | POA: Diagnosis not present

## 2018-09-19 MED ORDER — CYANOCOBALAMIN 1000 MCG/ML IJ SOLN
INTRAMUSCULAR | Status: AC
  Filled 2018-09-19: qty 1

## 2018-09-19 MED ORDER — CYANOCOBALAMIN 1000 MCG/ML IJ SOLN
1000.0000 ug | Freq: Once | INTRAMUSCULAR | Status: AC
  Administered 2018-09-19: 1000 ug via INTRAMUSCULAR

## 2018-09-19 NOTE — Patient Instructions (Signed)
Cyanocobalamin, Vitamin B12 injection What is this medicine? CYANOCOBALAMIN (sye an oh koe BAL a min) is a man made form of vitamin B12. Vitamin B12 is used in the growth of healthy blood cells, nerve cells, and proteins in the body. It also helps with the metabolism of fats and carbohydrates. This medicine is used to treat people who can not absorb vitamin B12. This medicine may be used for other purposes; ask your health care provider or pharmacist if you have questions. COMMON BRAND NAME(S): B-12 Compliance Kit, B-12 Injection Kit, Cyomin, LA-12, Nutri-Twelve, Physicians EZ Use B-12, Primabalt What should I tell my health care provider before I take this medicine? They need to know if you have any of these conditions: -kidney disease -Leber's disease -megaloblastic anemia -an unusual or allergic reaction to cyanocobalamin, cobalt, other medicines, foods, dyes, or preservatives -pregnant or trying to get pregnant -breast-feeding How should I use this medicine? This medicine is injected into a muscle or deeply under the skin. It is usually given by a health care professional in a clinic or doctor's office. However, your doctor may teach you how to inject yourself. Follow all instructions. Talk to your pediatrician regarding the use of this medicine in children. Special care may be needed. Overdosage: If you think you have taken too much of this medicine contact a poison control center or emergency room at once. NOTE: This medicine is only for you. Do not share this medicine with others. What if I miss a dose? If you are given your dose at a clinic or doctor's office, call to reschedule your appointment. If you give your own injections and you miss a dose, take it as soon as you can. If it is almost time for your next dose, take only that dose. Do not take double or extra doses. What may interact with this medicine? -colchicine -heavy alcohol intake This list may not describe all possible  interactions. Give your health care provider a list of all the medicines, herbs, non-prescription drugs, or dietary supplements you use. Also tell them if you smoke, drink alcohol, or use illegal drugs. Some items may interact with your medicine. What should I watch for while using this medicine? Visit your doctor or health care professional regularly. You may need blood work done while you are taking this medicine. You may need to follow a special diet. Talk to your doctor. Limit your alcohol intake and avoid smoking to get the best benefit. What side effects may I notice from receiving this medicine? Side effects that you should report to your doctor or health care professional as soon as possible: -allergic reactions like skin rash, itching or hives, swelling of the face, lips, or tongue -blue tint to skin -chest tightness, pain -difficulty breathing, wheezing -dizziness -red, swollen painful area on the leg Side effects that usually do not require medical attention (report to your doctor or health care professional if they continue or are bothersome): -diarrhea -headache This list may not describe all possible side effects. Call your doctor for medical advice about side effects. You may report side effects to FDA at 1-800-FDA-1088. Where should I keep my medicine? Keep out of the reach of children. Store at room temperature between 15 and 30 degrees C (59 and 85 degrees F). Protect from light. Throw away any unused medicine after the expiration date. NOTE: This sheet is a summary. It may not cover all possible information. If you have questions about this medicine, talk to your doctor, pharmacist, or   health care provider.  2019 Elsevier/Gold Standard (2007-12-18 22:10:20)  

## 2018-09-21 DIAGNOSIS — R7989 Other specified abnormal findings of blood chemistry: Secondary | ICD-10-CM | POA: Diagnosis not present

## 2018-09-21 DIAGNOSIS — E538 Deficiency of other specified B group vitamins: Secondary | ICD-10-CM | POA: Diagnosis not present

## 2018-09-21 DIAGNOSIS — D509 Iron deficiency anemia, unspecified: Secondary | ICD-10-CM | POA: Diagnosis not present

## 2018-09-21 DIAGNOSIS — R5383 Other fatigue: Secondary | ICD-10-CM | POA: Diagnosis not present

## 2018-09-21 DIAGNOSIS — L03115 Cellulitis of right lower limb: Secondary | ICD-10-CM | POA: Diagnosis not present

## 2018-09-25 ENCOUNTER — Ambulatory Visit: Payer: Medicare Other | Admitting: Internal Medicine

## 2018-09-25 ENCOUNTER — Other Ambulatory Visit: Payer: Medicare Other

## 2018-09-25 ENCOUNTER — Ambulatory Visit: Payer: Medicare Other | Admitting: Hematology & Oncology

## 2018-09-25 DIAGNOSIS — I255 Ischemic cardiomyopathy: Secondary | ICD-10-CM | POA: Diagnosis not present

## 2018-09-26 ENCOUNTER — Inpatient Hospital Stay: Payer: Medicare Other | Attending: Hematology & Oncology

## 2018-09-26 VITALS — BP 137/54 | HR 80 | Temp 98.0°F | Resp 17

## 2018-09-26 DIAGNOSIS — Z794 Long term (current) use of insulin: Secondary | ICD-10-CM | POA: Diagnosis not present

## 2018-09-26 DIAGNOSIS — Z7901 Long term (current) use of anticoagulants: Secondary | ICD-10-CM | POA: Diagnosis not present

## 2018-09-26 DIAGNOSIS — D510 Vitamin B12 deficiency anemia due to intrinsic factor deficiency: Secondary | ICD-10-CM | POA: Diagnosis not present

## 2018-09-26 DIAGNOSIS — Z79899 Other long term (current) drug therapy: Secondary | ICD-10-CM | POA: Insufficient documentation

## 2018-09-26 DIAGNOSIS — R161 Splenomegaly, not elsewhere classified: Secondary | ICD-10-CM | POA: Insufficient documentation

## 2018-09-26 DIAGNOSIS — D6959 Other secondary thrombocytopenia: Secondary | ICD-10-CM | POA: Diagnosis not present

## 2018-09-26 DIAGNOSIS — L03115 Cellulitis of right lower limb: Secondary | ICD-10-CM | POA: Diagnosis not present

## 2018-09-26 DIAGNOSIS — R634 Abnormal weight loss: Secondary | ICD-10-CM | POA: Insufficient documentation

## 2018-09-26 DIAGNOSIS — E119 Type 2 diabetes mellitus without complications: Secondary | ICD-10-CM | POA: Diagnosis not present

## 2018-09-26 DIAGNOSIS — D509 Iron deficiency anemia, unspecified: Secondary | ICD-10-CM | POA: Insufficient documentation

## 2018-09-26 DIAGNOSIS — D500 Iron deficiency anemia secondary to blood loss (chronic): Secondary | ICD-10-CM

## 2018-09-26 DIAGNOSIS — K909 Intestinal malabsorption, unspecified: Secondary | ICD-10-CM

## 2018-09-26 LAB — BASIC METABOLIC PANEL
BUN/Creatinine Ratio: 20 (ref 10–24)
BUN: 23 mg/dL (ref 8–27)
CO2: 24 mmol/L (ref 20–29)
CREATININE: 1.17 mg/dL (ref 0.76–1.27)
Calcium: 10 mg/dL (ref 8.6–10.2)
Chloride: 98 mmol/L (ref 96–106)
GFR calc Af Amer: 69 mL/min/{1.73_m2} (ref >59)
GFR calc non Af Amer: 60 mL/min/{1.73_m2} (ref >59)
Glucose: 253 mg/dL — ABNORMAL HIGH (ref 65–99)
Potassium: 4.5 mmol/L (ref 3.5–5.2)
Sodium: 137 mmol/L (ref 134–144)

## 2018-09-26 MED ORDER — CYANOCOBALAMIN 1000 MCG/ML IJ SOLN
1000.0000 ug | Freq: Once | INTRAMUSCULAR | Status: AC
  Administered 2018-09-26: 1000 ug via INTRAMUSCULAR

## 2018-09-26 NOTE — Progress Notes (Signed)
Patient notified by letter

## 2018-09-26 NOTE — Patient Instructions (Signed)
Cyanocobalamin, Vitamin B12 injection What is this medicine? CYANOCOBALAMIN (sye an oh koe BAL a min) is a man made form of vitamin B12. Vitamin B12 is used in the growth of healthy blood cells, nerve cells, and proteins in the body. It also helps with the metabolism of fats and carbohydrates. This medicine is used to treat people who can not absorb vitamin B12. This medicine may be used for other purposes; ask your health care provider or pharmacist if you have questions. COMMON BRAND NAME(S): B-12 Compliance Kit, B-12 Injection Kit, Cyomin, LA-12, Nutri-Twelve, Physicians EZ Use B-12, Primabalt What should I tell my health care provider before I take this medicine? They need to know if you have any of these conditions: -kidney disease -Leber's disease -megaloblastic anemia -an unusual or allergic reaction to cyanocobalamin, cobalt, other medicines, foods, dyes, or preservatives -pregnant or trying to get pregnant -breast-feeding How should I use this medicine? This medicine is injected into a muscle or deeply under the skin. It is usually given by a health care professional in a clinic or doctor's office. However, your doctor may teach you how to inject yourself. Follow all instructions. Talk to your pediatrician regarding the use of this medicine in children. Special care may be needed. Overdosage: If you think you have taken too much of this medicine contact a poison control center or emergency room at once. NOTE: This medicine is only for you. Do not share this medicine with others. What if I miss a dose? If you are given your dose at a clinic or doctor's office, call to reschedule your appointment. If you give your own injections and you miss a dose, take it as soon as you can. If it is almost time for your next dose, take only that dose. Do not take double or extra doses. What may interact with this medicine? -colchicine -heavy alcohol intake This list may not describe all possible  interactions. Give your health care provider a list of all the medicines, herbs, non-prescription drugs, or dietary supplements you use. Also tell them if you smoke, drink alcohol, or use illegal drugs. Some items may interact with your medicine. What should I watch for while using this medicine? Visit your doctor or health care professional regularly. You may need blood work done while you are taking this medicine. You may need to follow a special diet. Talk to your doctor. Limit your alcohol intake and avoid smoking to get the best benefit. What side effects may I notice from receiving this medicine? Side effects that you should report to your doctor or health care professional as soon as possible: -allergic reactions like skin rash, itching or hives, swelling of the face, lips, or tongue -blue tint to skin -chest tightness, pain -difficulty breathing, wheezing -dizziness -red, swollen painful area on the leg Side effects that usually do not require medical attention (report to your doctor or health care professional if they continue or are bothersome): -diarrhea -headache This list may not describe all possible side effects. Call your doctor for medical advice about side effects. You may report side effects to FDA at 1-800-FDA-1088. Where should I keep my medicine? Keep out of the reach of children. Store at room temperature between 15 and 30 degrees C (59 and 85 degrees F). Protect from light. Throw away any unused medicine after the expiration date. NOTE: This sheet is a summary. It may not cover all possible information. If you have questions about this medicine, talk to your doctor, pharmacist, or   health care provider.  2019 Elsevier/Gold Standard (2007-12-18 22:10:20)  

## 2018-10-03 ENCOUNTER — Other Ambulatory Visit: Payer: Self-pay

## 2018-10-03 ENCOUNTER — Inpatient Hospital Stay: Payer: Medicare Other

## 2018-10-03 VITALS — BP 134/56 | HR 68 | Temp 98.0°F | Resp 16

## 2018-10-03 DIAGNOSIS — K909 Intestinal malabsorption, unspecified: Secondary | ICD-10-CM

## 2018-10-03 DIAGNOSIS — D500 Iron deficiency anemia secondary to blood loss (chronic): Secondary | ICD-10-CM

## 2018-10-03 DIAGNOSIS — D509 Iron deficiency anemia, unspecified: Secondary | ICD-10-CM | POA: Diagnosis not present

## 2018-10-03 DIAGNOSIS — D510 Vitamin B12 deficiency anemia due to intrinsic factor deficiency: Secondary | ICD-10-CM | POA: Diagnosis not present

## 2018-10-03 DIAGNOSIS — R161 Splenomegaly, not elsewhere classified: Secondary | ICD-10-CM | POA: Diagnosis not present

## 2018-10-03 DIAGNOSIS — D6959 Other secondary thrombocytopenia: Secondary | ICD-10-CM | POA: Diagnosis not present

## 2018-10-03 DIAGNOSIS — Z79899 Other long term (current) drug therapy: Secondary | ICD-10-CM | POA: Diagnosis not present

## 2018-10-03 DIAGNOSIS — L03115 Cellulitis of right lower limb: Secondary | ICD-10-CM | POA: Diagnosis not present

## 2018-10-03 MED ORDER — CYANOCOBALAMIN 1000 MCG/ML IJ SOLN
1000.0000 ug | Freq: Once | INTRAMUSCULAR | Status: AC
  Administered 2018-10-03: 1000 ug via INTRAMUSCULAR

## 2018-10-03 MED ORDER — CYANOCOBALAMIN 1000 MCG/ML IJ SOLN
INTRAMUSCULAR | Status: AC
  Filled 2018-10-03: qty 1

## 2018-10-03 NOTE — Patient Instructions (Signed)
Cyanocobalamin, Vitamin B12 injection What is this medicine? CYANOCOBALAMIN (sye an oh koe BAL a min) is a man made form of vitamin B12. Vitamin B12 is used in the growth of healthy blood cells, nerve cells, and proteins in the body. It also helps with the metabolism of fats and carbohydrates. This medicine is used to treat people who can not absorb vitamin B12. This medicine may be used for other purposes; ask your health care provider or pharmacist if you have questions. COMMON BRAND NAME(S): B-12 Compliance Kit, B-12 Injection Kit, Cyomin, LA-12, Nutri-Twelve, Physicians EZ Use B-12, Primabalt What should I tell my health care provider before I take this medicine? They need to know if you have any of these conditions: -kidney disease -Leber's disease -megaloblastic anemia -an unusual or allergic reaction to cyanocobalamin, cobalt, other medicines, foods, dyes, or preservatives -pregnant or trying to get pregnant -breast-feeding How should I use this medicine? This medicine is injected into a muscle or deeply under the skin. It is usually given by a health care professional in a clinic or doctor's office. However, your doctor may teach you how to inject yourself. Follow all instructions. Talk to your pediatrician regarding the use of this medicine in children. Special care may be needed. Overdosage: If you think you have taken too much of this medicine contact a poison control center or emergency room at once. NOTE: This medicine is only for you. Do not share this medicine with others. What if I miss a dose? If you are given your dose at a clinic or doctor's office, call to reschedule your appointment. If you give your own injections and you miss a dose, take it as soon as you can. If it is almost time for your next dose, take only that dose. Do not take double or extra doses. What may interact with this medicine? -colchicine -heavy alcohol intake This list may not describe all possible  interactions. Give your health care provider a list of all the medicines, herbs, non-prescription drugs, or dietary supplements you use. Also tell them if you smoke, drink alcohol, or use illegal drugs. Some items may interact with your medicine. What should I watch for while using this medicine? Visit your doctor or health care professional regularly. You may need blood work done while you are taking this medicine. You may need to follow a special diet. Talk to your doctor. Limit your alcohol intake and avoid smoking to get the best benefit. What side effects may I notice from receiving this medicine? Side effects that you should report to your doctor or health care professional as soon as possible: -allergic reactions like skin rash, itching or hives, swelling of the face, lips, or tongue -blue tint to skin -chest tightness, pain -difficulty breathing, wheezing -dizziness -red, swollen painful area on the leg Side effects that usually do not require medical attention (report to your doctor or health care professional if they continue or are bothersome): -diarrhea -headache This list may not describe all possible side effects. Call your doctor for medical advice about side effects. You may report side effects to FDA at 1-800-FDA-1088. Where should I keep my medicine? Keep out of the reach of children. Store at room temperature between 15 and 30 degrees C (59 and 85 degrees F). Protect from light. Throw away any unused medicine after the expiration date. NOTE: This sheet is a summary. It may not cover all possible information. If you have questions about this medicine, talk to your doctor, pharmacist, or   health care provider.  2019 Elsevier/Gold Standard (2007-12-18 22:10:20)  

## 2018-10-05 DIAGNOSIS — K7460 Unspecified cirrhosis of liver: Secondary | ICD-10-CM | POA: Diagnosis not present

## 2018-10-05 DIAGNOSIS — L0390 Cellulitis, unspecified: Secondary | ICD-10-CM | POA: Diagnosis not present

## 2018-10-06 NOTE — Progress Notes (Signed)
HPI: FU coronary artery disease. Patient is status post coronary artery bypassing graft in 1997. Patient had a LIMA to the LAD, saphenous vein graft to the diagonal, saphenous vein graft to the circumflex and sequential saphenous vein graft to the PDA and posterior lateral. His procedure was performed at [facility]. He subsequently moved to [facility]. Echocardiogram in 2005 showed an ejection fraction of 38%, restrictive filling and trace tricuspid regurgitation. Patient had ICD placed previously. Also with h/o atrial fibrillation. Nuclear study 12/16 showed EF 50; prior inferior MI with mild peri-infarct ischemia and moderate ischemia in the anterolateral wall. Cardiac catheterization January 2017 showed severe three-vessel coronary disease. There was total occlusion of the saphenous vein graft to the obtuse marginal, total occlusion of the saphenous vein graft to diagonal and there was a 50-70% stenosis in the sequential vein graft to the PDA and left ventricular branch and the LIMA to the LAD was patent. Ejection fraction 35-45%. Medical therapy recommended. Abdominal ultrasound August 2017 showed no aneurysm.  Abdominal CT 9/19 shows cirrhosis, portal hypertension and splenomegaly. Echocardiogram December 2019 showed normal LV function.  Admitted December 2019 with cellulitis.  Patient treated with antibiotics and hydration.  Noted to have acute renal insufficiency and diuretics held.  After discharge began having increased edema and diuretics resumed.  Since last seen,he has some dyspnea on exertion.  No orthopnea or PND.  Minimal pedal edema.  No chest pain or syncope.  Current Outpatient Medications  Medication Sig Dispense Refill  . amoxicillin (AMOXIL) 500 MG capsule Take 500 mg by mouth as directed. 6 prior to dental work    . BD INSULIN SYRINGE U-500 31G X 6MM 0.5 ML MISC USE 1 SYRINGE TO INJECT UNDER THE SKIN THREE TIMES A DAY BEFORE MEALS 300 each 3  . BENICAR 40 MG tablet TAKE  1 TABLET TWICE A DAY 180 tablet 3  . carvedilol (COREG) 6.25 MG tablet Take 1 tablet (6.25 mg total) by mouth 2 (two) times daily with a meal. 90 tablet 3  . cephALEXin (KEFLEX) 500 MG capsule     . Cholecalciferol (VITAMIN D) 50 MCG (2000 UT) tablet Take 2,000 Units by mouth daily.    . Coenzyme Q10 200 MG TABS Take 1 tablet by mouth daily.    . Cranberry 200 MG CAPS Take 200 mg by mouth 2 (two) times a week. Take on Tues & Thurs    . ezetimibe (ZETIA) 10 MG tablet TAKE 1 TABLET DAILY (Patient taking differently: Take 10 mg by mouth daily. ) 90 tablet 4  . insulin regular human CONCENTRATED (HUMULIN R) 500 UNIT/ML injection Inject up to 200 units a day as advised (Patient taking differently: Inject 100-140 Units into the skin 2 (two) times daily with a meal. Take 140 units at breakfast and Take 100 units at lunch.) 3 vial 3  . metFORMIN (GLUCOPHAGE-XR) 500 MG 24 hr tablet Take 1000 mg with lunch 180 tablet 3  . Multiple Vitamins-Minerals (CENTRUM SILVER PO) Take 1 tablet by mouth daily.    . nitroGLYCERIN (NITROSTAT) 0.4 MG SL tablet Place 1 tablet (0.4 mg total) under the tongue every 5 (five) minutes as needed for chest pain. 75 tablet 3  . NON FORMULARY Take 1 tablet by mouth daily. Methyfolate 2000 MCG daily     . omega-3 acid ethyl esters (LOVAZA) 1 g capsule TAKE 2 CAPSULES TWICE A DAY (Patient taking differently: Take 2 g by mouth 2 (two) times daily. TAKE 2 CAPSULES  TWICE A DAY) 360 capsule 1  . Peppermint Oil (IBGARD) 90 MG CPCR Take 90 mg by mouth 2 (two) times daily.    . pravastatin (PRAVACHOL) 80 MG tablet TAKE 1 TABLET DAILY (Patient taking differently: Take 80 mg by mouth daily. ) 90 tablet 3  . Probiotic Product (PROBIOTIC DAILY PO) Take 1 tablet by mouth daily.    . TRICOR 48 MG tablet TAKE 1 TABLET DAILY (Patient taking differently: Take 48 mg by mouth daily. ) 90 tablet 4  . XARELTO 20 MG TABS tablet TAKE 1 TABLET DAILY WITH SUPPER (Patient taking differently: Take 20 mg by mouth  every evening. ) 90 tablet 3   No current facility-administered medications for this visit.      Past Medical History:  Diagnosis Date  . Arthritis   . Atrial fibrillation ([place])    takes Xarelto daily  . CAD (coronary artery disease)   . Cardiomyopathy, ischemic 11/07/2013  . Cholelithiasis   . Chronic back pain    scoliosis/arthritis  . Complication of anesthesia    forgetful for about 2-3wks after anesthesia  . Congestive heart failure ([place])    takes Spironolactone daily  . Diabetes mellitus ([place])    takes Invokamet daily as well as Hum U  . History of colon polyps   . History of kidney stones   . Hyperlipidemia    takes [employer] daily  . Hyperlipidemia 11/07/2013  . Hypertension    takes Benicar and Coreg daily  . ICD (implantable cardioverter-defibrillator) battery depletion   . ICD (implantable cardioverter-defibrillator) in place 11/07/2013  . Iron deficiency anemia due to chronic blood loss 08/24/2018  . Iron malabsorption 08/24/2018  . Ischemic cardiomyopathy   . Ischemic cardiomyopathy   . Joint pain   . Myocardial infarction ([place]) 1997   on the OR table   . Nephrolithiasis   . Obstructive sleep apnea   . Peripheral neuropathy   . Pernicious anemia 08/24/2018  . Pneumonia    as a child  . Spinal stenosis   . Thrombocytopenia ([place])   . Total knee replacement status 11/29/2014  . Type 2 diabetes mellitus with stage 3 chronic kidney disease ([place]) 05/19/2015  . Urinary frequency   . Urinary urgency     Past Surgical History:  Procedure Laterality Date  . CARDIAC CATHETERIZATION N/A 10/02/2015   Procedure: Left Heart Cath and Cors/Grafts Angiography;  Surgeon: Belva Crome, MD;  Location: [place] [facility];  Service: Cardiovascular;  Laterality: N/A;  . COLONOSCOPY    . coronary artery bypass and graft  1997   x 5  . ESOPHAGOGASTRODUODENOSCOPY    . ICD placed    . LAMINECTOMY    . LITHOTRIPSY    . Right rotator cuff surgery    .  TONSILLECTOMY     adenoids  . TOTAL KNEE ARTHROPLASTY Left   . TOTAL KNEE ARTHROPLASTY Right 11/29/2014   Procedure: RIGHT TOTAL KNEE ARTHROPLASTY;  Surgeon: Leandrew Koyanagi, MD;  Location: [place];  Service: Orthopedics;  Laterality: Right;    Social History   Socioeconomic History  . Marital status: Married    Spouse name: Not on file  . Number of children: Not on file  . Years of education: Not on file  . Highest education level: Not on file  Occupational History    Comment: Retired from [employer]  . Financial resource strain: Not on file  . Food insecurity:    Worry: Not on  file    Inability: Not on file  . Transportation needs:    Medical: Not on file    Non-medical: Not on file  Tobacco Use  . Smoking status: Never Smoker  . Smokeless tobacco: Never Used  Substance and Sexual Activity  . Alcohol use: Yes    Alcohol/week: 0.0 standard drinks    Comment: once drink a month  . Drug use: No  . Sexual activity: Not on file  Lifestyle  . Physical activity:    Days per week: Not on file    Minutes per session: Not on file  . Stress: Not on file  Relationships  . Social connections:    Talks on phone: Not on file    Gets together: Not on file    Attends religious service: Not on file    Active member of club or organization: Not on file    Attends meetings of clubs or organizations: Not on file    Relationship status: Not on file  . Intimate partner violence:    Fear of current or ex partner: Not on file    Emotionally abused: Not on file    Physically abused: Not on file    Forced sexual activity: Not on file  Other Topics Concern  . Not on file  Social History Narrative  . Not on file    Family History  Problem Relation Age of Onset  . Heart disease Other        No family history    ROS: no fevers or chills, productive cough, hemoptysis, dysphasia, odynophagia, melena, hematochezia, dysuria, hematuria, rash, seizure activity, orthopnea, PND,  pedal edema, claudication. Remaining systems are negative.  Physical Exam: Well-developed obese in no acute distress.  Skin is warm and dry.  HEENT is normal.  Neck is supple.  Chest is clear to auscultation with normal expansion.  Cardiovascular exam is regular rate and rhythm.  Abdominal exam nontender or distended. No masses palpated. Extremities show trace to 1+ edema.  Some erythema over right lower extremity. neuro grossly intact   A/P  1 paroxysmal atrial fibrillation-patient remains in sinus rhythm on examination.  Continue Xarelto.  Continue present dose of carvedilol for rate control if atrial fibrillation recurs.  2 chronic combined systolic/diastolic congestive heart failure-patient's volume status has improved.  Continue present dose of Lasix and spironolactone. Continue fluid restriction and low-sodium diet.  3 coronary artery disease-patient denies chest pain.  Continue statin.  He is not on aspirin given need for Xarelto.  4 hypertension-patient's blood pressure is controlled.  Continue present medications.  5 hyperlipidemia-continue statin and Zetia.  6 history of ischemic cardiomyopathy-continue ARB and beta-blocker.  7 prior ICD-managed by electrophysiology.  Kirk Ruths, MD

## 2018-10-10 ENCOUNTER — Inpatient Hospital Stay: Payer: Medicare Other

## 2018-10-10 ENCOUNTER — Telehealth: Payer: Self-pay | Admitting: *Deleted

## 2018-10-10 ENCOUNTER — Inpatient Hospital Stay (HOSPITAL_BASED_OUTPATIENT_CLINIC_OR_DEPARTMENT_OTHER): Payer: Medicare Other | Admitting: Hematology & Oncology

## 2018-10-10 ENCOUNTER — Other Ambulatory Visit: Payer: Self-pay

## 2018-10-10 VITALS — BP 128/54 | HR 73 | Temp 97.8°F | Resp 20 | Wt 318.8 lb

## 2018-10-10 DIAGNOSIS — D510 Vitamin B12 deficiency anemia due to intrinsic factor deficiency: Secondary | ICD-10-CM | POA: Diagnosis not present

## 2018-10-10 DIAGNOSIS — D500 Iron deficiency anemia secondary to blood loss (chronic): Secondary | ICD-10-CM

## 2018-10-10 DIAGNOSIS — D509 Iron deficiency anemia, unspecified: Secondary | ICD-10-CM

## 2018-10-10 DIAGNOSIS — R634 Abnormal weight loss: Secondary | ICD-10-CM

## 2018-10-10 DIAGNOSIS — R161 Splenomegaly, not elsewhere classified: Secondary | ICD-10-CM | POA: Diagnosis not present

## 2018-10-10 DIAGNOSIS — D6959 Other secondary thrombocytopenia: Secondary | ICD-10-CM

## 2018-10-10 DIAGNOSIS — L03115 Cellulitis of right lower limb: Secondary | ICD-10-CM | POA: Diagnosis not present

## 2018-10-10 DIAGNOSIS — E119 Type 2 diabetes mellitus without complications: Secondary | ICD-10-CM | POA: Diagnosis not present

## 2018-10-10 DIAGNOSIS — Z79899 Other long term (current) drug therapy: Secondary | ICD-10-CM

## 2018-10-10 DIAGNOSIS — Z794 Long term (current) use of insulin: Secondary | ICD-10-CM

## 2018-10-10 DIAGNOSIS — Z7901 Long term (current) use of anticoagulants: Secondary | ICD-10-CM | POA: Diagnosis not present

## 2018-10-10 DIAGNOSIS — K909 Intestinal malabsorption, unspecified: Secondary | ICD-10-CM

## 2018-10-10 LAB — CMP (CANCER CENTER ONLY)
ALT: 58 U/L — ABNORMAL HIGH (ref 0–44)
AST: 58 U/L — ABNORMAL HIGH (ref 15–41)
Albumin: 4.2 g/dL (ref 3.5–5.0)
Alkaline Phosphatase: 133 U/L — ABNORMAL HIGH (ref 38–126)
Anion gap: 9 (ref 5–15)
BUN: 28 mg/dL — ABNORMAL HIGH (ref 8–23)
CO2: 27 mmol/L (ref 22–32)
Calcium: 10.7 mg/dL — ABNORMAL HIGH (ref 8.9–10.3)
Chloride: 100 mmol/L (ref 98–111)
Creatinine: 1.19 mg/dL (ref 0.61–1.24)
GFR, Est AFR Am: >60 mL/min (ref >60)
GFR, Estimated: 59 mL/min — ABNORMAL LOW (ref >60)
Glucose, Bld: 151 mg/dL — ABNORMAL HIGH (ref 70–99)
Potassium: 4.4 mmol/L (ref 3.5–5.1)
Sodium: 136 mmol/L (ref 135–145)
Total Bilirubin: 1.5 mg/dL — ABNORMAL HIGH (ref 0.3–1.2)
Total Protein: 7.1 g/dL (ref 6.5–8.1)

## 2018-10-10 LAB — CBC WITH DIFFERENTIAL (CANCER CENTER ONLY)
Abs Immature Granulocytes: 0.02 10*3/uL (ref 0.00–0.07)
Basophils Absolute: 0 10*3/uL (ref 0.0–0.1)
Basophils Relative: 0 %
Eosinophils Absolute: 0.2 10*3/uL (ref 0.0–0.5)
Eosinophils Relative: 4 %
HCT: 36.6 % — ABNORMAL LOW (ref 39.0–52.0)
Hemoglobin: 11.5 g/dL — ABNORMAL LOW (ref 13.0–17.0)
Immature Granulocytes: 0 %
LYMPHS PCT: 13 %
Lymphs Abs: 0.6 10*3/uL — ABNORMAL LOW (ref 0.7–4.0)
MCH: 29.3 pg (ref 26.0–34.0)
MCHC: 31.4 g/dL (ref 30.0–36.0)
MCV: 93.1 fL (ref 80.0–100.0)
Monocytes Absolute: 0.7 10*3/uL (ref 0.1–1.0)
Monocytes Relative: 14 %
Neutro Abs: 3.2 10*3/uL (ref 1.7–7.7)
Neutrophils Relative %: 69 %
Platelet Count: 87 10*3/uL — ABNORMAL LOW (ref 150–400)
RBC: 3.93 MIL/uL — ABNORMAL LOW (ref 4.22–5.81)
RDW: 19.4 % — ABNORMAL HIGH (ref 11.5–15.5)
WBC Count: 4.6 10*3/uL (ref 4.0–10.5)
nRBC: 0 % (ref 0.0–0.2)

## 2018-10-10 LAB — IRON AND TIBC
IRON: 57 ug/dL (ref 42–163)
Saturation Ratios: 18 % — ABNORMAL LOW (ref 20–55)
TIBC: 318 ug/dL (ref 202–409)
UIBC: 260 ug/dL (ref 117–376)

## 2018-10-10 LAB — RETICULOCYTES
Immature Retic Fract: 16.9 % — ABNORMAL HIGH (ref 2.3–15.9)
RBC.: 3.93 MIL/uL — ABNORMAL LOW (ref 4.22–5.81)
Retic Count, Absolute: 78.6 10*3/uL (ref 19.0–186.0)
Retic Ct Pct: 2 % (ref 0.4–3.1)

## 2018-10-10 LAB — FERRITIN: FERRITIN: 712 ng/mL — AB (ref 24–336)

## 2018-10-10 MED ORDER — CYANOCOBALAMIN 1000 MCG/ML IJ SOLN
INTRAMUSCULAR | Status: AC
  Filled 2018-10-10: qty 1

## 2018-10-10 MED ORDER — CYANOCOBALAMIN 1000 MCG/ML IJ SOLN
1000.0000 ug | Freq: Once | INTRAMUSCULAR | Status: AC
  Administered 2018-10-10: 1000 ug via INTRAMUSCULAR

## 2018-10-10 NOTE — Telephone Encounter (Addendum)
-----   Message from [name], MD sent at 10/10/2018  1:27 PM EST ----- Called patient to let him know his iron level is low!!  Need to get 1 dose of Feraheme. Talked with patient wife and set up for this Friday

## 2018-10-10 NOTE — Progress Notes (Signed)
Hematology and Oncology Follow Up Visit  Robert Gregory [id number] [birth year] 78 y.o. 10/10/2018   Principle Diagnosis:    Chronic thrombocytopenia secondary to splenomegaly  1.57 m exophytic lesion lower pole left kidney  Iron def anemia -- GI blood loss  Pernicious Anemia  Current Therapy:    IV Iron  Vit B12  -- Induction Phase     Interim History:  Robert Gregory is back for follow-up.  Overall, he seems to be holding his own.  He was in the hospital right for Christmas.  He has cellulitis in his right lower leg.  He is not sure how he got this.  He has done well with the iron and vitamin B12 injections.  This is given him a little bit more energy.  He has had an improvement in his blood counts.  I still think his biggest issue is going to be his diabetes.  This really has been on the high side with respect to his blood sugars.   When we saw him back in early December, his ferritin was 153 with an iron saturation of only 10%.  He got 2 doses of IV iron.  His vitamin B12 level was only 140.  We will have to send off his parietal cell antibodies and intrinsic factor antibodies to see what might be causing the pernicious anemia.    He has had no bleeding.  His appetite is not that great.  He has lost some weight.  He is still on some antibiotics for this cellulitis.  He has not had diarrhea.  Overall, his performance status is ECOG 1.   Medications:  Current Outpatient Medications:  .  amoxicillin (AMOXIL) 500 MG capsule, Take 500 mg by mouth as directed. 6 prior to dental work, Disp: , Rfl:  .  BD INSULIN SYRINGE U-500 31G X 6MM 0.5 ML MISC, USE 1 SYRINGE TO INJECT UNDER THE SKIN THREE TIMES A DAY BEFORE MEALS, Disp: 300 each, Rfl: 3 .  BENICAR 40 MG tablet, TAKE 1 TABLET TWICE A DAY, Disp: 180 tablet, Rfl: 3 .  carvedilol (COREG) 6.25 MG tablet, Take 1 tablet (6.25 mg total) by mouth 2 (two) times daily with a meal., Disp: 90 tablet, Rfl: 3 .  cephALEXin (KEFLEX) 500 MG  capsule, , Disp: , Rfl:  .  Cholecalciferol (VITAMIN D) 50 MCG (2000 UT) tablet, Take 2,000 Units by mouth daily., Disp: , Rfl:  .  Coenzyme Q10 200 MG TABS, Take 1 tablet by mouth daily., Disp: , Rfl:  .  Cranberry 200 MG CAPS, Take 200 mg by mouth 2 (two) times a week. Take on Tues & Thurs, Disp: , Rfl:  .  ezetimibe (ZETIA) 10 MG tablet, TAKE 1 TABLET DAILY (Patient taking differently: Take 10 mg by mouth daily. ), Disp: 90 tablet, Rfl: 4 .  insulin regular human CONCENTRATED (HUMULIN R) 500 UNIT/ML injection, Inject up to 200 units a day as advised (Patient taking differently: Inject 100-140 Units into the skin 2 (two) times daily with a meal. Take 140 units at breakfast and Take 100 units at lunch.), Disp: 3 vial, Rfl: 3 .  metFORMIN (GLUCOPHAGE-XR) 500 MG 24 hr tablet, Take 1000 mg with lunch, Disp: 180 tablet, Rfl: 3 .  Multiple Vitamins-Minerals (CENTRUM SILVER PO), Take 1 tablet by mouth daily., Disp: , Rfl:  .  nitroGLYCERIN (NITROSTAT) 0.4 MG SL tablet, Place 1 tablet (0.4 mg total) under the tongue every 5 (five) minutes as needed for chest pain., Disp: 75  tablet, Rfl: 3 .  NON FORMULARY, Take 1 tablet by mouth daily. Methyfolate 2000 MCG daily , Disp: , Rfl:  .  omega-3 acid ethyl esters (LOVAZA) 1 g capsule, TAKE 2 CAPSULES TWICE A DAY (Patient taking differently: Take 2 g by mouth 2 (two) times daily. TAKE 2 CAPSULES TWICE A DAY), Disp: 360 capsule, Rfl: 1 .  Peppermint Oil (IBGARD) 90 MG CPCR, Take 90 mg by mouth 2 (two) times daily., Disp: , Rfl:  .  pravastatin (PRAVACHOL) 80 MG tablet, TAKE 1 TABLET DAILY (Patient taking differently: Take 80 mg by mouth daily. ), Disp: 90 tablet, Rfl: 3 .  Probiotic Product (PROBIOTIC DAILY PO), Take 1 tablet by mouth daily., Disp: , Rfl:  .  TRICOR 48 MG tablet, TAKE 1 TABLET DAILY (Patient taking differently: Take 48 mg by mouth daily. ), Disp: 90 tablet, Rfl: 4 .  XARELTO 20 MG TABS tablet, TAKE 1 TABLET DAILY WITH SUPPER (Patient taking  differently: Take 20 mg by mouth every evening. ), Disp: 90 tablet, Rfl: 3 No current facility-administered medications for this visit.   Facility-Administered Medications Ordered in Other Visits:  .  cyanocobalamin ((VITAMIN B-12)) injection 1,000 mcg, 1,000 mcg, Intramuscular, Once, [place], Holli Humbles, NP  Allergies:  Allergies  Allergen Reactions  . Iodine     flushing  . Seasonal Ic [Cholestatin]   . Tape Other (See Comments)    Rash  Rash   . Contrast Media [Iodinated Diagnostic Agents] Rash and Other (See Comments)    flushing EXTREME FLUSHING   . Ioxaglate Other (See Comments) and Rash    flushing EXTREME FLUSHING     Past Medical History, Surgical history, Social history, and Family History were reviewed and updated.  Review of Systems: Review of Systems  Constitutional: Negative.   HENT: Negative.   Eyes: Negative.   Respiratory: Negative.   Cardiovascular: Negative.   Gastrointestinal: Negative.   Genitourinary: Negative.   Musculoskeletal: Negative.   Skin: Negative.   Neurological: Negative.   Endo/Heme/Allergies: Negative.   Psychiatric/Behavioral: Negative.     Physical Exam:  weight is 318 lb 12 oz (144.6 kg) (abnormal). His oral temperature is 97.8 F (36.6 C). His blood pressure is 128/54 (abnormal) and his pulse is 73. His respiration is 20 and oxygen saturation is 100%.   Wt Readings from Last 3 Encounters:  10/10/18 (!) 318 lb 12 oz (144.6 kg)  09/15/18 (!) 335 lb (152 kg)  09/08/18 (!) 324 lb 8.3 oz (147.2 kg)     Physical Exam Vitals signs reviewed.  HENT:     Head: Normocephalic and atraumatic.  Eyes:     Pupils: Pupils are equal, round, and reactive to light.  Neck:     Musculoskeletal: Normal range of motion.  Cardiovascular:     Rate and Rhythm: Normal rate and regular rhythm.     Heart sounds: Normal heart sounds.  Pulmonary:     Effort: Pulmonary effort is normal.     Breath sounds: Normal breath sounds.  Abdominal:      General: Bowel sounds are normal.     Palpations: Abdomen is soft.  Musculoskeletal: Normal range of motion.        General: No tenderness or deformity.  Lymphadenopathy:     Cervical: No cervical adenopathy.  Skin:    General: Skin is warm and dry.     Findings: No erythema or rash.  Neurological:     Mental Status: He is alert and oriented to person, place,  and time.  Psychiatric:        Behavior: Behavior normal.        Thought Content: Thought content normal.        Judgment: Judgment normal.      Lab Results  Component Value Date   WBC 4.6 10/10/2018   HGB 11.5 (L) 10/10/2018   HCT 36.6 (L) 10/10/2018   MCV 93.1 10/10/2018   PLT 87 (L) 10/10/2018     Chemistry      Component Value Date/Time   NA 136 10/10/2018 1004   NA 137 09/25/2018 1405   NA 140 02/28/2017 1041   NA 141 04/21/2016 0852   K 4.4 10/10/2018 1004   K 4.0 02/28/2017 1041   K 4.4 04/21/2016 0852   CL 100 10/10/2018 1004   CL 105 02/28/2017 1041   CO2 27 10/10/2018 1004   CO2 25 02/28/2017 1041   CO2 22 04/21/2016 0852   BUN 28 (H) 10/10/2018 1004   BUN 23 09/25/2018 1405   BUN 30 (H) 02/28/2017 1041   BUN 29.0 (H) 04/21/2016 0852   CREATININE 1.19 10/10/2018 1004   CREATININE 1.46 (H) 11/07/2017 1455   CREATININE 1.6 (H) 04/21/2016 0852      Component Value Date/Time   CALCIUM 10.7 (H) 10/10/2018 1004   CALCIUM 9.7 02/28/2017 1041   CALCIUM 10.2 04/21/2016 0852   ALKPHOS 133 (H) 10/10/2018 1004   ALKPHOS 52 02/28/2017 1041   ALKPHOS 51 04/21/2016 0852   AST 58 (H) 10/10/2018 1004   AST 39 (H) 04/21/2016 0852   ALT 58 (H) 10/10/2018 1004   ALT 51 (H) 02/28/2017 1041   ALT 46 04/21/2016 0852   BILITOT 1.5 (H) 10/10/2018 1004   BILITOT 0.71 04/21/2016 0852         Impression and Plan: Robert Gregory is a 78 year old white male. He has moderate thrombocytopenia.   We still have a ways to go with his blood in my opinion.  I am checking an erythropoietin level on him.  I will have to  see how that looks.  It is possible he may need some ESA if his hemoglobin does not get better with iron.  I will see him back in 1 month.  He will continue his vitamin B12 injections weekly for right now.      [name], MD 1/21/202011:06 AM

## 2018-10-10 NOTE — Addendum Note (Signed)
Addended by: Ruthe Mannan, Oz Gammel A on: 10/10/2018 10:03 AM   Modules accepted: Orders

## 2018-10-11 ENCOUNTER — Encounter: Payer: Medicare Other | Attending: Internal Medicine | Admitting: Skilled Nursing Facility1

## 2018-10-11 ENCOUNTER — Encounter: Payer: Self-pay | Admitting: Cardiology

## 2018-10-11 ENCOUNTER — Encounter: Payer: Self-pay | Admitting: Skilled Nursing Facility1

## 2018-10-11 ENCOUNTER — Ambulatory Visit (INDEPENDENT_AMBULATORY_CARE_PROVIDER_SITE_OTHER): Payer: Medicare Other | Admitting: Cardiology

## 2018-10-11 ENCOUNTER — Encounter: Payer: Self-pay | Admitting: Hematology & Oncology

## 2018-10-11 VITALS — BP 126/58 | HR 86 | Ht 73.0 in | Wt 321.0 lb

## 2018-10-11 DIAGNOSIS — I2510 Atherosclerotic heart disease of native coronary artery without angina pectoris: Secondary | ICD-10-CM | POA: Diagnosis not present

## 2018-10-11 DIAGNOSIS — E119 Type 2 diabetes mellitus without complications: Secondary | ICD-10-CM | POA: Diagnosis not present

## 2018-10-11 DIAGNOSIS — I255 Ischemic cardiomyopathy: Secondary | ICD-10-CM

## 2018-10-11 DIAGNOSIS — I5042 Chronic combined systolic (congestive) and diastolic (congestive) heart failure: Secondary | ICD-10-CM | POA: Diagnosis not present

## 2018-10-11 DIAGNOSIS — I10 Essential (primary) hypertension: Secondary | ICD-10-CM | POA: Diagnosis not present

## 2018-10-11 DIAGNOSIS — E7800 Pure hypercholesterolemia, unspecified: Secondary | ICD-10-CM

## 2018-10-11 DIAGNOSIS — I480 Paroxysmal atrial fibrillation: Secondary | ICD-10-CM | POA: Diagnosis not present

## 2018-10-11 LAB — INTRINSIC FACTOR ANTIBODIES: Intrinsic Factor: 1.1 AU/mL (ref 0.0–1.1)

## 2018-10-11 LAB — ANTI-PARIETAL ANTIBODY: Parietal Cell Antibody-IgG: 2.2 Units (ref 0.0–20.0)

## 2018-10-11 NOTE — Progress Notes (Signed)
Diabetes Self-Management Education  Visit Type: First/Initial  10/12/2018  [name], identified by name and date of birth, is a 78 y.o. male with a diagnosis of Diabetes: Type 2.   ASSESSMENT  Weight (!) 320 lb (145.2 kg). Body mass index is 42.22 kg/m.  Pt states his blood sugars have been irradic due to cellulitis.  Fasting: 150-160 2 hours post prandial: 188-195; pt states he takes his bolus insulin and still 2-3 hours later it is over 200 been like this since the infection in December  Pt states since being sick he has lost 15 pounds. Pt states he has normal bowel movements. Pt states he lived in [place] for a while.  Dinner: 8-9pm, breakfast 9:30-10am, Lunch: 2pm Pt states he typically drinks about 1 gallon of water. Pt states he would like to get back into walking.  Pt states he will aim to walk 1/4 mile 3 days a week. Pt is knowledgeable but struggles with appropriate portion sizes.  Pt states he takes folate supplements due to have a condition disabling him to absorb it form his diet.  Pt states he does not like non starchy vegetables.  Pt states his wife is a Marine scientist and takes good care of him. Pt is very knowledgeable of his diseases/conditions.  Pt states he does not understand why even when he takes his insulin with his lunch his numbers are always well over 200: Dietitian helped pt to make the connection between his high carbohydrate lunches and his high blood sugars 2 hours later.   Diabetes Self-Management Education - 10/11/18 1408      Visit Information   Visit Type  First/Initial      Initial Visit   Diabetes Type  Type 2    Are you currently following a meal plan?  No    Are you taking your medications as prescribed?  Yes      Health Coping   How would you rate your overall health?  Good      Psychosocial Assessment   Patient Belief/Attitude about Diabetes  Motivated to manage diabetes      Pre-Education Assessment   Patient understands the  diabetes disease and treatment process.  Needs Instruction    Patient understands incorporating nutritional management into lifestyle.  Needs Instruction    Patient undertands incorporating physical activity into lifestyle.  Needs Instruction    Patient understands using medications safely.  Needs Instruction    Patient understands monitoring blood glucose, interpreting and using results  Needs Instruction    Patient understands prevention, detection, and treatment of acute complications.  Needs Instruction    Patient understands prevention, detection, and treatment of chronic complications.  Needs Instruction    Patient understands how to develop strategies to address psychosocial issues.  Needs Instruction    Patient understands how to develop strategies to promote health/change behavior.  Needs Instruction      Complications   Last HgB A1C per patient/outside source  7 %    How often do you check your blood sugar?  3-4 times/day    Fasting Blood glucose range (mg/dL)  130-179    Postprandial Blood glucose range (mg/dL)  130-179;180-200    Number of hypoglycemic episodes per month  0    Number of hyperglycemic episodes per week  0    Have you had a dilated eye exam in the past 12 months?  Yes    Have you had a dental exam in the past 12 months?  Yes  Are you checking your feet?  Yes    How many days per week are you checking your feet?  7      Dietary Intake   Breakfast  cereal (unknown type) with double digits fiber or bread with peanut butter or meat and cheese and bread    Snack (morning)  nuts    Lunch  eating out: chicken and noodles or mcdonalds burger or pork/chicken with beans and corn/potatoes     Snack (afternoon)  nuts    Dinner  soup and half sandwich    Beverage(s)  1 glass of milk, 1 diet soda, water      Exercise   Exercise Type  ADL's      Patient Education   Previous Diabetes Education  No    Disease state   Factors that contribute to the development of  diabetes;Definition of diabetes, type 1 and 2, and the diagnosis of diabetes    Nutrition management   Role of diet in the treatment of diabetes and the relationship between the three main macronutrients and blood glucose level;Food label reading, portion sizes and measuring food.;Information on hints to eating out and maintain blood glucose control.;Meal options for control of blood glucose level and chronic complications.    Physical activity and exercise   Identified with patient nutritional and/or medication changes necessary with exercise.;Role of exercise on diabetes management, blood pressure control and cardiac health.    Monitoring  Purpose and frequency of SMBG.;Taught/evaluated SMBG meter.;Daily foot exams;Yearly dilated eye exam;Taught/discussed recording of test results and interpretation of SMBG.;Identified appropriate SMBG and/or A1C goals.    Acute complications  Taught treatment of hypoglycemia - the 15 rule.;Discussed and identified patients' treatment of hyperglycemia.    Chronic complications  Assessed and discussed foot care and prevention of foot problems;Relationship between chronic complications and blood glucose control;Dental care;Retinopathy and reason for yearly dilated eye exams    Psychosocial adjustment  Role of stress on diabetes;Worked with patient to identify barriers to care and solutions    Personal strategies to promote health  Lifestyle issues that need to be addressed for better diabetes care      Individualized Goals (developed by patient)   Nutrition  General guidelines for healthy choices and portions discussed    Physical Activity  Exercise 5-7 days per week;30 minutes per day;15 minutes per day      Post-Education Assessment   Patient understands the diabetes disease and treatment process.  Demonstrates understanding / competency    Patient understands incorporating nutritional management into lifestyle.  Demonstrates understanding / competency    Patient  undertands incorporating physical activity into lifestyle.  Demonstrates understanding / competency    Patient understands using medications safely.  Demonstrates understanding / competency    Patient understands monitoring blood glucose, interpreting and using results  Demonstrates understanding / competency    Patient understands prevention, detection, and treatment of acute complications.  Demonstrates understanding / competency    Patient understands prevention, detection, and treatment of chronic complications.  Demonstrates understanding / competency    Patient understands how to develop strategies to address psychosocial issues.  Demonstrates understanding / competency    Patient understands how to develop strategies to promote health/change behavior.  Demonstrates understanding / competency      Outcomes   Expected Outcomes  Demonstrated interest in learning. Expect positive outcomes    Future DMSE  PRN    Program Status  Completed       Individualized Plan for Diabetes Self-Management  Training:   Learning Objective:  Patient will have a greater understanding of diabetes self-management. Patient education plan is to attend individual and/or group sessions per assessed needs and concerns.   Plan:   Try one new non starchy vegetable a week Follow a balanced carbohydrate dinner sticking to about 3 carb choices for each meal  Expected Outcomes:  Demonstrated interest in learning. Expect positive outcomes  Education material provided: ADA Diabetes: Your Take Control Guide, Meal plan card, My Plate, Snack sheet and Support group flyer  If problems or questions, patient to contact team via:  Phone and Email  Future DSME appointment: PRN

## 2018-10-11 NOTE — Patient Instructions (Signed)
Medication Instructions:   NO CHANGE  Follow-Up:  Your physician recommends that you schedule a follow-up appointment in: [place] CALL IN MAY TO SCHEDULE APPOINTMENT [place]

## 2018-10-12 DIAGNOSIS — R600 Localized edema: Secondary | ICD-10-CM | POA: Diagnosis not present

## 2018-10-12 DIAGNOSIS — L570 Actinic keratosis: Secondary | ICD-10-CM | POA: Diagnosis not present

## 2018-10-13 ENCOUNTER — Inpatient Hospital Stay: Payer: Medicare Other

## 2018-10-13 VITALS — BP 125/46 | HR 77 | Temp 97.8°F | Resp 16

## 2018-10-13 DIAGNOSIS — L03115 Cellulitis of right lower limb: Secondary | ICD-10-CM | POA: Diagnosis not present

## 2018-10-13 DIAGNOSIS — D500 Iron deficiency anemia secondary to blood loss (chronic): Secondary | ICD-10-CM

## 2018-10-13 DIAGNOSIS — Z79899 Other long term (current) drug therapy: Secondary | ICD-10-CM | POA: Diagnosis not present

## 2018-10-13 DIAGNOSIS — R161 Splenomegaly, not elsewhere classified: Secondary | ICD-10-CM | POA: Diagnosis not present

## 2018-10-13 DIAGNOSIS — D510 Vitamin B12 deficiency anemia due to intrinsic factor deficiency: Secondary | ICD-10-CM | POA: Diagnosis not present

## 2018-10-13 DIAGNOSIS — D509 Iron deficiency anemia, unspecified: Secondary | ICD-10-CM | POA: Diagnosis not present

## 2018-10-13 DIAGNOSIS — K909 Intestinal malabsorption, unspecified: Secondary | ICD-10-CM

## 2018-10-13 DIAGNOSIS — D6959 Other secondary thrombocytopenia: Secondary | ICD-10-CM | POA: Diagnosis not present

## 2018-10-13 LAB — CUP PACEART REMOTE DEVICE CHECK
Battery Remaining Longevity: 53 mo
Battery Remaining Percentage: 51 %
Battery Voltage: 2.92 V
Brady Statistic RV Percent Paced: 1 %
Date Time Interrogation Session: 20191204070016
HighPow Impedance: 78 Ohm
HighPow Impedance: 78 Ohm
Implantable Lead Implant Date: 20140320
Implantable Lead Implant Date: 20140320
Implantable Lead Location: 753859
Implantable Lead Location: 753860
Implantable Pulse Generator Implant Date: 20140320
Lead Channel Impedance Value: 360 Ohm
Lead Channel Impedance Value: 390 Ohm
Lead Channel Sensing Intrinsic Amplitude: 10.2 mV
Lead Channel Sensing Intrinsic Amplitude: 2.4 mV
Lead Channel Setting Pacing Amplitude: 2 V
Lead Channel Setting Pacing Pulse Width: 0.5 ms
MDC IDC MSMT LEADCHNL RV PACING THRESHOLD AMPLITUDE: 0.75 V
MDC IDC MSMT LEADCHNL RV PACING THRESHOLD PULSEWIDTH: 0.5 ms
MDC IDC SET LEADCHNL RV SENSING SENSITIVITY: 0.5 mV
Pulse Gen Serial Number: 7067730

## 2018-10-13 MED ORDER — SODIUM CHLORIDE 0.9 % IV SOLN
510.0000 mg | Freq: Once | INTRAVENOUS | Status: AC
  Administered 2018-10-13: 510 mg via INTRAVENOUS
  Filled 2018-10-13: qty 17

## 2018-10-13 MED ORDER — SODIUM CHLORIDE 0.9 % IV SOLN
INTRAVENOUS | Status: DC
  Administered 2018-10-13: 15:00:00 via INTRAVENOUS
  Filled 2018-10-13: qty 250

## 2018-10-13 NOTE — Patient Instructions (Signed)

## 2018-10-17 ENCOUNTER — Other Ambulatory Visit: Payer: Self-pay

## 2018-10-17 ENCOUNTER — Inpatient Hospital Stay: Payer: Medicare Other

## 2018-10-17 VITALS — BP 119/51 | HR 80 | Temp 98.0°F

## 2018-10-17 DIAGNOSIS — K909 Intestinal malabsorption, unspecified: Secondary | ICD-10-CM

## 2018-10-17 DIAGNOSIS — L03115 Cellulitis of right lower limb: Secondary | ICD-10-CM | POA: Diagnosis not present

## 2018-10-17 DIAGNOSIS — D510 Vitamin B12 deficiency anemia due to intrinsic factor deficiency: Secondary | ICD-10-CM | POA: Diagnosis not present

## 2018-10-17 DIAGNOSIS — D509 Iron deficiency anemia, unspecified: Secondary | ICD-10-CM | POA: Diagnosis not present

## 2018-10-17 DIAGNOSIS — D6959 Other secondary thrombocytopenia: Secondary | ICD-10-CM | POA: Diagnosis not present

## 2018-10-17 DIAGNOSIS — R161 Splenomegaly, not elsewhere classified: Secondary | ICD-10-CM | POA: Diagnosis not present

## 2018-10-17 DIAGNOSIS — Z79899 Other long term (current) drug therapy: Secondary | ICD-10-CM | POA: Diagnosis not present

## 2018-10-17 DIAGNOSIS — D500 Iron deficiency anemia secondary to blood loss (chronic): Secondary | ICD-10-CM

## 2018-10-17 MED ORDER — CYANOCOBALAMIN 1000 MCG/ML IJ SOLN
1000.0000 ug | Freq: Once | INTRAMUSCULAR | Status: AC
  Administered 2018-10-17: 1000 ug via INTRAMUSCULAR

## 2018-10-17 MED ORDER — CYANOCOBALAMIN 1000 MCG/ML IJ SOLN
INTRAMUSCULAR | Status: AC
  Filled 2018-10-17: qty 1

## 2018-10-17 NOTE — Patient Instructions (Signed)
Cyanocobalamin, Vitamin B12 injection What is this medicine? CYANOCOBALAMIN (sye an oh koe BAL a min) is a man made form of vitamin B12. Vitamin B12 is used in the growth of healthy blood cells, nerve cells, and proteins in the body. It also helps with the metabolism of fats and carbohydrates. This medicine is used to treat people who can not absorb vitamin B12. This medicine may be used for other purposes; ask your health care provider or pharmacist if you have questions. COMMON BRAND NAME(S): B-12 Compliance Kit, B-12 Injection Kit, Cyomin, LA-12, Nutri-Twelve, Physicians EZ Use B-12, Primabalt What should I tell my health care provider before I take this medicine? They need to know if you have any of these conditions: -kidney disease -Leber's disease -megaloblastic anemia -an unusual or allergic reaction to cyanocobalamin, cobalt, other medicines, foods, dyes, or preservatives -pregnant or trying to get pregnant -breast-feeding How should I use this medicine? This medicine is injected into a muscle or deeply under the skin. It is usually given by a health care professional in a clinic or doctor's office. However, your doctor may teach you how to inject yourself. Follow all instructions. Talk to your pediatrician regarding the use of this medicine in children. Special care may be needed. Overdosage: If you think you have taken too much of this medicine contact a poison control center or emergency room at once. NOTE: This medicine is only for you. Do not share this medicine with others. What if I miss a dose? If you are given your dose at a clinic or doctor's office, call to reschedule your appointment. If you give your own injections and you miss a dose, take it as soon as you can. If it is almost time for your next dose, take only that dose. Do not take double or extra doses. What may interact with this medicine? -colchicine -heavy alcohol intake This list may not describe all possible  interactions. Give your health care provider a list of all the medicines, herbs, non-prescription drugs, or dietary supplements you use. Also tell them if you smoke, drink alcohol, or use illegal drugs. Some items may interact with your medicine. What should I watch for while using this medicine? Visit your doctor or health care professional regularly. You may need blood work done while you are taking this medicine. You may need to follow a special diet. Talk to your doctor. Limit your alcohol intake and avoid smoking to get the best benefit. What side effects may I notice from receiving this medicine? Side effects that you should report to your doctor or health care professional as soon as possible: -allergic reactions like skin rash, itching or hives, swelling of the face, lips, or tongue -blue tint to skin -chest tightness, pain -difficulty breathing, wheezing -dizziness -red, swollen painful area on the leg Side effects that usually do not require medical attention (report to your doctor or health care professional if they continue or are bothersome): -diarrhea -headache This list may not describe all possible side effects. Call your doctor for medical advice about side effects. You may report side effects to FDA at 1-800-FDA-1088. Where should I keep my medicine? Keep out of the reach of children. Store at room temperature between 15 and 30 degrees C (59 and 85 degrees F). Protect from light. Throw away any unused medicine after the expiration date. NOTE: This sheet is a summary. It may not cover all possible information. If you have questions about this medicine, talk to your doctor, pharmacist, or   health care provider.  2019 Elsevier/Gold Standard (2007-12-18 22:10:20)  

## 2018-10-24 ENCOUNTER — Other Ambulatory Visit: Payer: Self-pay

## 2018-10-24 ENCOUNTER — Inpatient Hospital Stay: Payer: Medicare Other | Attending: Hematology & Oncology

## 2018-10-24 VITALS — BP 124/54 | HR 75 | Temp 97.6°F | Resp 17

## 2018-10-24 DIAGNOSIS — E119 Type 2 diabetes mellitus without complications: Secondary | ICD-10-CM | POA: Diagnosis not present

## 2018-10-24 DIAGNOSIS — D510 Vitamin B12 deficiency anemia due to intrinsic factor deficiency: Secondary | ICD-10-CM | POA: Insufficient documentation

## 2018-10-24 DIAGNOSIS — D500 Iron deficiency anemia secondary to blood loss (chronic): Secondary | ICD-10-CM

## 2018-10-24 DIAGNOSIS — Z79899 Other long term (current) drug therapy: Secondary | ICD-10-CM | POA: Insufficient documentation

## 2018-10-24 DIAGNOSIS — Z7901 Long term (current) use of anticoagulants: Secondary | ICD-10-CM | POA: Insufficient documentation

## 2018-10-24 DIAGNOSIS — Z794 Long term (current) use of insulin: Secondary | ICD-10-CM | POA: Insufficient documentation

## 2018-10-24 DIAGNOSIS — D509 Iron deficiency anemia, unspecified: Secondary | ICD-10-CM | POA: Insufficient documentation

## 2018-10-24 DIAGNOSIS — R161 Splenomegaly, not elsewhere classified: Secondary | ICD-10-CM | POA: Diagnosis not present

## 2018-10-24 DIAGNOSIS — D6959 Other secondary thrombocytopenia: Secondary | ICD-10-CM | POA: Insufficient documentation

## 2018-10-24 DIAGNOSIS — K909 Intestinal malabsorption, unspecified: Secondary | ICD-10-CM

## 2018-10-24 MED ORDER — CYANOCOBALAMIN 1000 MCG/ML IJ SOLN
INTRAMUSCULAR | Status: AC
  Filled 2018-10-24: qty 1

## 2018-10-24 MED ORDER — CYANOCOBALAMIN 1000 MCG/ML IJ SOLN
1000.0000 ug | Freq: Once | INTRAMUSCULAR | Status: AC
  Administered 2018-10-24: 1000 ug via INTRAMUSCULAR

## 2018-10-24 NOTE — Patient Instructions (Signed)
Cyanocobalamin, Vitamin B12 injection What is this medicine? CYANOCOBALAMIN (sye an oh koe BAL a min) is a man made form of vitamin B12. Vitamin B12 is used in the growth of healthy blood cells, nerve cells, and proteins in the body. It also helps with the metabolism of fats and carbohydrates. This medicine is used to treat people who can not absorb vitamin B12. This medicine may be used for other purposes; ask your health care provider or pharmacist if you have questions. COMMON BRAND NAME(S): B-12 Compliance Kit, B-12 Injection Kit, Cyomin, LA-12, Nutri-Twelve, Physicians EZ Use B-12, Primabalt What should I tell my health care provider before I take this medicine? They need to know if you have any of these conditions: -kidney disease -Leber's disease -megaloblastic anemia -an unusual or allergic reaction to cyanocobalamin, cobalt, other medicines, foods, dyes, or preservatives -pregnant or trying to get pregnant -breast-feeding How should I use this medicine? This medicine is injected into a muscle or deeply under the skin. It is usually given by a health care professional in a clinic or doctor's office. However, your doctor may teach you how to inject yourself. Follow all instructions. Talk to your pediatrician regarding the use of this medicine in children. Special care may be needed. Overdosage: If you think you have taken too much of this medicine contact a poison control center or emergency room at once. NOTE: This medicine is only for you. Do not share this medicine with others. What if I miss a dose? If you are given your dose at a clinic or doctor's office, call to reschedule your appointment. If you give your own injections and you miss a dose, take it as soon as you can. If it is almost time for your next dose, take only that dose. Do not take double or extra doses. What may interact with this medicine? -colchicine -heavy alcohol intake This list may not describe all possible  interactions. Give your health care provider a list of all the medicines, herbs, non-prescription drugs, or dietary supplements you use. Also tell them if you smoke, drink alcohol, or use illegal drugs. Some items may interact with your medicine. What should I watch for while using this medicine? Visit your doctor or health care professional regularly. You may need blood work done while you are taking this medicine. You may need to follow a special diet. Talk to your doctor. Limit your alcohol intake and avoid smoking to get the best benefit. What side effects may I notice from receiving this medicine? Side effects that you should report to your doctor or health care professional as soon as possible: -allergic reactions like skin rash, itching or hives, swelling of the face, lips, or tongue -blue tint to skin -chest tightness, pain -difficulty breathing, wheezing -dizziness -red, swollen painful area on the leg Side effects that usually do not require medical attention (report to your doctor or health care professional if they continue or are bothersome): -diarrhea -headache This list may not describe all possible side effects. Call your doctor for medical advice about side effects. You may report side effects to FDA at 1-800-FDA-1088. Where should I keep my medicine? Keep out of the reach of children. Store at room temperature between 15 and 30 degrees C (59 and 85 degrees F). Protect from light. Throw away any unused medicine after the expiration date. NOTE: This sheet is a summary. It may not cover all possible information. If you have questions about this medicine, talk to your doctor, pharmacist, or   health care provider.  2019 Elsevier/Gold Standard (2007-12-18 22:10:20)  

## 2018-10-31 ENCOUNTER — Inpatient Hospital Stay: Payer: Medicare Other

## 2018-10-31 VITALS — BP 127/46 | HR 71 | Temp 98.3°F | Resp 20

## 2018-10-31 DIAGNOSIS — D500 Iron deficiency anemia secondary to blood loss (chronic): Secondary | ICD-10-CM

## 2018-10-31 DIAGNOSIS — K909 Intestinal malabsorption, unspecified: Secondary | ICD-10-CM

## 2018-10-31 DIAGNOSIS — D510 Vitamin B12 deficiency anemia due to intrinsic factor deficiency: Secondary | ICD-10-CM | POA: Diagnosis not present

## 2018-10-31 DIAGNOSIS — D509 Iron deficiency anemia, unspecified: Secondary | ICD-10-CM | POA: Diagnosis not present

## 2018-10-31 DIAGNOSIS — Z7901 Long term (current) use of anticoagulants: Secondary | ICD-10-CM | POA: Diagnosis not present

## 2018-10-31 DIAGNOSIS — R161 Splenomegaly, not elsewhere classified: Secondary | ICD-10-CM | POA: Diagnosis not present

## 2018-10-31 DIAGNOSIS — D6959 Other secondary thrombocytopenia: Secondary | ICD-10-CM | POA: Diagnosis not present

## 2018-10-31 DIAGNOSIS — E119 Type 2 diabetes mellitus without complications: Secondary | ICD-10-CM | POA: Diagnosis not present

## 2018-10-31 MED ORDER — CYANOCOBALAMIN 1000 MCG/ML IJ SOLN
INTRAMUSCULAR | Status: AC
  Filled 2018-10-31: qty 1

## 2018-10-31 MED ORDER — CYANOCOBALAMIN 1000 MCG/ML IJ SOLN
1000.0000 ug | Freq: Once | INTRAMUSCULAR | Status: AC
  Administered 2018-10-31: 1000 ug via INTRAMUSCULAR

## 2018-11-07 ENCOUNTER — Inpatient Hospital Stay: Payer: Medicare Other

## 2018-11-07 ENCOUNTER — Encounter: Payer: Self-pay | Admitting: Internal Medicine

## 2018-11-07 ENCOUNTER — Other Ambulatory Visit: Payer: Self-pay

## 2018-11-07 ENCOUNTER — Inpatient Hospital Stay (HOSPITAL_BASED_OUTPATIENT_CLINIC_OR_DEPARTMENT_OTHER): Payer: Medicare Other | Admitting: Hematology & Oncology

## 2018-11-07 VITALS — BP 143/53 | HR 89 | Temp 97.9°F | Resp 19 | Wt 312.5 lb

## 2018-11-07 DIAGNOSIS — D500 Iron deficiency anemia secondary to blood loss (chronic): Secondary | ICD-10-CM

## 2018-11-07 DIAGNOSIS — D510 Vitamin B12 deficiency anemia due to intrinsic factor deficiency: Secondary | ICD-10-CM

## 2018-11-07 DIAGNOSIS — Z79899 Other long term (current) drug therapy: Secondary | ICD-10-CM

## 2018-11-07 DIAGNOSIS — D6959 Other secondary thrombocytopenia: Secondary | ICD-10-CM | POA: Diagnosis not present

## 2018-11-07 DIAGNOSIS — K909 Intestinal malabsorption, unspecified: Secondary | ICD-10-CM

## 2018-11-07 DIAGNOSIS — Z7901 Long term (current) use of anticoagulants: Secondary | ICD-10-CM | POA: Diagnosis not present

## 2018-11-07 DIAGNOSIS — R161 Splenomegaly, not elsewhere classified: Secondary | ICD-10-CM

## 2018-11-07 DIAGNOSIS — Z794 Long term (current) use of insulin: Secondary | ICD-10-CM | POA: Diagnosis not present

## 2018-11-07 DIAGNOSIS — E119 Type 2 diabetes mellitus without complications: Secondary | ICD-10-CM | POA: Diagnosis not present

## 2018-11-07 DIAGNOSIS — D509 Iron deficiency anemia, unspecified: Secondary | ICD-10-CM | POA: Diagnosis not present

## 2018-11-07 LAB — CBC WITH DIFFERENTIAL (CANCER CENTER ONLY)
Abs Immature Granulocytes: 0.01 10*3/uL (ref 0.00–0.07)
Basophils Absolute: 0 10*3/uL (ref 0.0–0.1)
Basophils Relative: 0 %
Eosinophils Absolute: 0.2 10*3/uL (ref 0.0–0.5)
Eosinophils Relative: 3 %
HCT: 38.8 % — ABNORMAL LOW (ref 39.0–52.0)
Hemoglobin: 12.3 g/dL — ABNORMAL LOW (ref 13.0–17.0)
Immature Granulocytes: 0 %
LYMPHS ABS: 0.6 10*3/uL — AB (ref 0.7–4.0)
Lymphocytes Relative: 13 %
MCH: 29.6 pg (ref 26.0–34.0)
MCHC: 31.7 g/dL (ref 30.0–36.0)
MCV: 93.5 fL (ref 80.0–100.0)
Monocytes Absolute: 0.7 10*3/uL (ref 0.1–1.0)
Monocytes Relative: 14 %
NRBC: 0 % (ref 0.0–0.2)
Neutro Abs: 3.4 10*3/uL (ref 1.7–7.7)
Neutrophils Relative %: 70 %
Platelet Count: 83 10*3/uL — ABNORMAL LOW (ref 150–400)
RBC: 4.15 MIL/uL — ABNORMAL LOW (ref 4.22–5.81)
RDW: 18.8 % — ABNORMAL HIGH (ref 11.5–15.5)
WBC Count: 4.9 10*3/uL (ref 4.0–10.5)

## 2018-11-07 LAB — CMP (CANCER CENTER ONLY)
ALT: 65 U/L — ABNORMAL HIGH (ref 0–44)
AST: 62 U/L — ABNORMAL HIGH (ref 15–41)
Albumin: 4.1 g/dL (ref 3.5–5.0)
Alkaline Phosphatase: 143 U/L — ABNORMAL HIGH (ref 38–126)
Anion gap: 8 (ref 5–15)
BUN: 32 mg/dL — ABNORMAL HIGH (ref 8–23)
CO2: 27 mmol/L (ref 22–32)
Calcium: 10.6 mg/dL — ABNORMAL HIGH (ref 8.9–10.3)
Chloride: 102 mmol/L (ref 98–111)
Creatinine: 1.31 mg/dL — ABNORMAL HIGH (ref 0.61–1.24)
GFR, Est AFR Am: >60 mL/min (ref >60)
GFR, Estimated: 52 mL/min — ABNORMAL LOW (ref >60)
Glucose, Bld: 238 mg/dL — ABNORMAL HIGH (ref 70–99)
POTASSIUM: 5.2 mmol/L — AB (ref 3.5–5.1)
Sodium: 137 mmol/L (ref 135–145)
TOTAL PROTEIN: 7 g/dL (ref 6.5–8.1)
Total Bilirubin: 0.9 mg/dL (ref 0.3–1.2)

## 2018-11-07 LAB — RETICULOCYTES
Immature Retic Fract: 16.2 % — ABNORMAL HIGH (ref 2.3–15.9)
RBC.: 4.15 MIL/uL — ABNORMAL LOW (ref 4.22–5.81)
Retic Count, Absolute: 66.4 10*3/uL (ref 19.0–186.0)
Retic Ct Pct: 1.6 % (ref 0.4–3.1)

## 2018-11-07 LAB — VITAMIN B12: Vitamin B-12: 1739 pg/mL — ABNORMAL HIGH (ref 180–914)

## 2018-11-07 MED ORDER — CYANOCOBALAMIN 1000 MCG/ML IJ SOLN
INTRAMUSCULAR | Status: AC
  Filled 2018-11-07: qty 1

## 2018-11-07 MED ORDER — CYANOCOBALAMIN 1000 MCG/ML IJ SOLN
1000.0000 ug | Freq: Once | INTRAMUSCULAR | Status: AC
  Administered 2018-11-07: 1000 ug via INTRAMUSCULAR

## 2018-11-07 NOTE — Progress Notes (Signed)
Hematology and Oncology Follow Up Visit  Robert Gregory [id number] 1941-05-28 78 y.o. 11/07/2018   Principle Diagnosis:    Chronic thrombocytopenia secondary to splenomegaly  1.57 m exophytic lesion lower pole left kidney  Iron def anemia -- GI blood loss  Pernicious Anemia  Current Therapy:    IV Iron  Vit B12  --1000 mcg IM q month     Interim History:  Robert Gregory is back for follow-up.  Despite the fact that his hemoglobin is improving nicely, he still has problems with fatigue and no stamina.  He gets short of breath.  I really am not sure what else to tell him about this.  His iron was a little bit on the low side today.  We will give him another dose of IV iron.  He is getting a vitamin B-12.  He is on the weekly dose.  He will now get monthly vitamin B12.  He has seen his cardiologist.  His cardiologist on think that there is a heart problem.  His weight certainly is not helping.  His diabetes is certainly not helping.  He is trying to lose some weight.  I am checking an erythropoietin level on him.  This might be a little bit low.  His iron studies have been doing okay.  Again, his ferritin today was 1000 with iron saturation of only 19%.  His vitamin B-12 level is 1700.  He has a blood sugar today of 238.  He has mild renal insufficiency with a creatinine of 1.31.  He has had no problems with bowels or bladder.  He has had no obvious rashes.  There may be a little bit of leg swelling.  Overall, his performance status is ECOG 1.   Medications:  Current Outpatient Medications:  .  amoxicillin (AMOXIL) 500 MG capsule, Take 500 mg by mouth as directed. 6 prior to dental work, Disp: , Rfl:  .  BD INSULIN SYRINGE U-500 31G X 6MM 0.5 ML MISC, USE 1 SYRINGE TO INJECT UNDER THE SKIN THREE TIMES A DAY BEFORE MEALS, Disp: 300 each, Rfl: 3 .  BENICAR 40 MG tablet, TAKE 1 TABLET TWICE A DAY, Disp: 180 tablet, Rfl: 3 .  carvedilol (COREG) 6.25 MG tablet, Take 1 tablet (6.25  mg total) by mouth 2 (two) times daily with a meal., Disp: 90 tablet, Rfl: 3 .  Cholecalciferol (VITAMIN D) 50 MCG (2000 UT) tablet, Take 2,000 Units by mouth daily., Disp: , Rfl:  .  Coenzyme Q10 200 MG TABS, Take 1 tablet by mouth daily., Disp: , Rfl:  .  Cranberry 200 MG CAPS, Take 200 mg by mouth 2 (two) times a week. Take on Tues & Thurs, Disp: , Rfl:  .  ezetimibe (ZETIA) 10 MG tablet, TAKE 1 TABLET DAILY (Patient taking differently: Take 10 mg by mouth daily. ), Disp: 90 tablet, Rfl: 4 .  insulin regular human CONCENTRATED (HUMULIN R) 500 UNIT/ML injection, Inject up to 200 units a day as advised (Patient taking differently: Inject 100-140 Units into the skin 2 (two) times daily with a meal. Take 140 units at breakfast and Take 100 units at lunch.), Disp: 3 vial, Rfl: 3 .  metFORMIN (GLUCOPHAGE-XR) 500 MG 24 hr tablet, Take 1000 mg with lunch, Disp: 180 tablet, Rfl: 3 .  Multiple Vitamins-Minerals (CENTRUM SILVER PO), Take 1 tablet by mouth daily., Disp: , Rfl:  .  nitroGLYCERIN (NITROSTAT) 0.4 MG SL tablet, Place 1 tablet (0.4 mg total) under the tongue every 5 (  five) minutes as needed for chest pain., Disp: 75 tablet, Rfl: 3 .  NON FORMULARY, Take 1 tablet by mouth daily. Methyfolate 2000 MCG daily , Disp: , Rfl:  .  omega-3 acid ethyl esters (LOVAZA) 1 g capsule, TAKE 2 CAPSULES TWICE A DAY (Patient taking differently: Take 2 g by mouth 2 (two) times daily. TAKE 2 CAPSULES TWICE A DAY), Disp: 360 capsule, Rfl: 1 .  Peppermint Oil (IBGARD) 90 MG CPCR, Take 90 mg by mouth 2 (two) times daily., Disp: , Rfl:  .  potassium chloride (K-DUR) 10 MEQ tablet, Take 10 mEq by mouth 2 (two) times daily. Take two tablets in the morning and two tablets in the evening., Disp: , Rfl:  .  pravastatin (PRAVACHOL) 80 MG tablet, TAKE 1 TABLET DAILY (Patient taking differently: Take 80 mg by mouth daily. ), Disp: 90 tablet, Rfl: 3 .  Probiotic Product (PROBIOTIC DAILY PO), Take 1 tablet by mouth daily., Disp: ,  Rfl:  .  TRICOR 48 MG tablet, TAKE 1 TABLET DAILY (Patient taking differently: Take 48 mg by mouth daily. ), Disp: 90 tablet, Rfl: 4 .  XARELTO 20 MG TABS tablet, TAKE 1 TABLET DAILY WITH SUPPER (Patient taking differently: Take 20 mg by mouth every evening. ), Disp: 90 tablet, Rfl: 3 No current facility-administered medications for this visit.   Facility-Administered Medications Ordered in Other Visits:  .  cyanocobalamin ((VITAMIN B-12)) injection 1,000 mcg, 1,000 mcg, Intramuscular, Once, [place], Holli Humbles, NP  Allergies:  Allergies  Allergen Reactions  . Iodine     flushing  . Seasonal Ic [Cholestatin]   . Tape Other (See Comments)    Rash  Rash   . Contrast Media [Iodinated Diagnostic Agents] Rash and Other (See Comments)    flushing EXTREME FLUSHING   . Ioxaglate Other (See Comments) and Rash    flushing EXTREME FLUSHING     Past Medical History, Surgical history, Social history, and Family History were reviewed and updated.  Review of Systems: Review of Systems  Constitutional: Negative.   HENT: Negative.   Eyes: Negative.   Respiratory: Negative.   Cardiovascular: Negative.   Gastrointestinal: Negative.   Genitourinary: Negative.   Musculoskeletal: Negative.   Skin: Negative.   Neurological: Negative.   Endo/Heme/Allergies: Negative.   Psychiatric/Behavioral: Negative.     Physical Exam:  weight is 312 lb 8 oz (141.7 kg) (abnormal). His oral temperature is 97.9 F (36.6 C). His blood pressure is 143/53 (abnormal) and his pulse is 89. His respiration is 19 and oxygen saturation is 100%.   Wt Readings from Last 3 Encounters:  11/07/18 (!) 312 lb 8 oz (141.7 kg)  10/11/18 (!) 320 lb (145.2 kg)  10/11/18 (!) 321 lb (145.6 kg)     Physical Exam Vitals signs reviewed.  HENT:     Head: Normocephalic and atraumatic.  Eyes:     Pupils: Pupils are equal, round, and reactive to light.  Neck:     Musculoskeletal: Normal range of motion.  Cardiovascular:      Rate and Rhythm: Normal rate and regular rhythm.     Heart sounds: Normal heart sounds.  Pulmonary:     Effort: Pulmonary effort is normal.     Breath sounds: Normal breath sounds.  Abdominal:     General: Bowel sounds are normal.     Palpations: Abdomen is soft.  Musculoskeletal: Normal range of motion.        General: No tenderness or deformity.  Lymphadenopathy:  Cervical: No cervical adenopathy.  Skin:    General: Skin is warm and dry.     Findings: No erythema or rash.  Neurological:     Mental Status: He is alert and oriented to person, place, and time.  Psychiatric:        Behavior: Behavior normal.        Thought Content: Thought content normal.        Judgment: Judgment normal.      Lab Results  Component Value Date   WBC 4.9 11/07/2018   HGB 12.3 (L) 11/07/2018   HCT 38.8 (L) 11/07/2018   MCV 93.5 11/07/2018   PLT 83 (L) 11/07/2018     Chemistry      Component Value Date/Time   NA 137 11/07/2018 1032   NA 137 09/25/2018 1405   NA 140 02/28/2017 1041   NA 141 04/21/2016 0852   K 5.2 (H) 11/07/2018 1032   K 4.0 02/28/2017 1041   K 4.4 04/21/2016 0852   CL 102 11/07/2018 1032   CL 105 02/28/2017 1041   CO2 27 11/07/2018 1032   CO2 25 02/28/2017 1041   CO2 22 04/21/2016 0852   BUN 32 (H) 11/07/2018 1032   BUN 23 09/25/2018 1405   BUN 30 (H) 02/28/2017 1041   BUN 29.0 (H) 04/21/2016 0852   CREATININE 1.31 (H) 11/07/2018 1032   CREATININE 1.46 (H) 11/07/2017 1455   CREATININE 1.6 (H) 04/21/2016 0852      Component Value Date/Time   CALCIUM 10.6 (H) 11/07/2018 1032   CALCIUM 9.7 02/28/2017 1041   CALCIUM 10.2 04/21/2016 0852   ALKPHOS 143 (H) 11/07/2018 1032   ALKPHOS 52 02/28/2017 1041   ALKPHOS 51 04/21/2016 0852   AST 62 (H) 11/07/2018 1032   AST 39 (H) 04/21/2016 0852   ALT 65 (H) 11/07/2018 1032   ALT 51 (H) 02/28/2017 1041   ALT 46 04/21/2016 0852   BILITOT 0.9 11/07/2018 1032   BILITOT 0.71 04/21/2016 0852         Impression  and Plan: Robert Gregory is a 78 year old white male. He has moderate thrombocytopenia.  I think the thrombocytopenia is from his hepatic steatosis and splenomegaly.  I am sure he has an element of portal hypertension.  His iron studies are improving.  His hemoglobin has come up nicely as expected.  We will go ahead with another dose of IV iron.  We will probably get this set up in about a week.  As far as his feelings of fatigue and shortness of breath, I said he could see a pulmonologist.  I suggested pulmonary function studies.  I am not sure who his family doctor is.  Something tells me that he does not see a family doctor.  Of note, his erythropoietin level was 35.  I think this is adequate for his at level of hemoglobin.  He will have his vitamin B12 injection today.  We will have him come back in another month and we will see how he is doing.    [name], MD 2/18/202011:43 AM

## 2018-11-08 ENCOUNTER — Telehealth: Payer: Self-pay | Admitting: Hematology & Oncology

## 2018-11-08 LAB — FERRITIN: FERRITIN: 1077 ng/mL — AB (ref 24–336)

## 2018-11-08 LAB — IRON AND TIBC
Iron: 60 ug/dL (ref 42–163)
Saturation Ratios: 19 % — ABNORMAL LOW (ref 20–55)
TIBC: 308 ug/dL (ref 202–409)
UIBC: 248 ug/dL (ref 117–376)

## 2018-11-08 LAB — ERYTHROPOIETIN: Erythropoietin: 35.2 m[IU]/mL — ABNORMAL HIGH (ref 2.6–18.5)

## 2018-11-08 NOTE — Telephone Encounter (Signed)
Appointments scheduled per 2/18 los & 2/19 staff message

## 2018-11-09 ENCOUNTER — Other Ambulatory Visit: Payer: Self-pay | Admitting: Gastroenterology

## 2018-11-09 ENCOUNTER — Telehealth: Payer: Self-pay | Admitting: *Deleted

## 2018-11-09 ENCOUNTER — Telehealth: Payer: Self-pay | Admitting: Family

## 2018-11-09 DIAGNOSIS — K7469 Other cirrhosis of liver: Secondary | ICD-10-CM

## 2018-11-09 DIAGNOSIS — K7460 Unspecified cirrhosis of liver: Secondary | ICD-10-CM

## 2018-11-09 NOTE — Telephone Encounter (Signed)
-----   Message from [name], MD sent at 11/08/2018 11:22 AM EST ----- Call - iron level is low but better.  Needs 1 dose of IV iron.  The EPO level is ok.  Laurey Arrow

## 2018-11-09 NOTE — Telephone Encounter (Signed)
Patient notified per order of [name] that iron level is low but better, that he will need one dose of IV iron and that EPO level is OK.  Patient appreciative of call and has no questions at this time.  Message sent to scheduling.

## 2018-11-09 NOTE — Telephone Encounter (Signed)
Appointment scheduled LMVM for patient with date/time per 2/202 staff msg

## 2018-11-10 DIAGNOSIS — L0390 Cellulitis, unspecified: Secondary | ICD-10-CM | POA: Diagnosis not present

## 2018-11-10 DIAGNOSIS — L03115 Cellulitis of right lower limb: Secondary | ICD-10-CM | POA: Diagnosis not present

## 2018-11-14 ENCOUNTER — Emergency Department (HOSPITAL_BASED_OUTPATIENT_CLINIC_OR_DEPARTMENT_OTHER)
Admission: EM | Admit: 2018-11-14 | Discharge: 2018-11-14 | Disposition: A | Discharge status: Home / Self Care | Payer: Medicare Other | Attending: Emergency Medicine | Admitting: Emergency Medicine

## 2018-11-14 ENCOUNTER — Other Ambulatory Visit: Payer: Self-pay

## 2018-11-14 ENCOUNTER — Inpatient Hospital Stay: Payer: Medicare Other

## 2018-11-14 ENCOUNTER — Other Ambulatory Visit: Payer: Self-pay | Admitting: *Deleted

## 2018-11-14 ENCOUNTER — Emergency Department (HOSPITAL_BASED_OUTPATIENT_CLINIC_OR_DEPARTMENT_OTHER): Payer: Medicare Other

## 2018-11-14 ENCOUNTER — Encounter (HOSPITAL_BASED_OUTPATIENT_CLINIC_OR_DEPARTMENT_OTHER): Payer: Self-pay | Admitting: *Deleted

## 2018-11-14 VITALS — BP 135/51 | HR 80 | Temp 97.8°F | Resp 20

## 2018-11-14 DIAGNOSIS — K909 Intestinal malabsorption, unspecified: Secondary | ICD-10-CM

## 2018-11-14 DIAGNOSIS — D500 Iron deficiency anemia secondary to blood loss (chronic): Secondary | ICD-10-CM

## 2018-11-14 DIAGNOSIS — R55 Syncope and collapse: Secondary | ICD-10-CM | POA: Diagnosis not present

## 2018-11-14 DIAGNOSIS — Z96653 Presence of artificial knee joint, bilateral: Secondary | ICD-10-CM | POA: Diagnosis not present

## 2018-11-14 DIAGNOSIS — I2510 Atherosclerotic heart disease of native coronary artery without angina pectoris: Secondary | ICD-10-CM | POA: Insufficient documentation

## 2018-11-14 DIAGNOSIS — I509 Heart failure, unspecified: Secondary | ICD-10-CM | POA: Insufficient documentation

## 2018-11-14 DIAGNOSIS — Z7901 Long term (current) use of anticoagulants: Secondary | ICD-10-CM | POA: Diagnosis not present

## 2018-11-14 DIAGNOSIS — R42 Dizziness and giddiness: Secondary | ICD-10-CM | POA: Diagnosis not present

## 2018-11-14 DIAGNOSIS — I252 Old myocardial infarction: Secondary | ICD-10-CM | POA: Insufficient documentation

## 2018-11-14 DIAGNOSIS — I110 Hypertensive heart disease with heart failure: Secondary | ICD-10-CM | POA: Diagnosis not present

## 2018-11-14 DIAGNOSIS — I4891 Unspecified atrial fibrillation: Secondary | ICD-10-CM | POA: Insufficient documentation

## 2018-11-14 DIAGNOSIS — Z794 Long term (current) use of insulin: Secondary | ICD-10-CM | POA: Insufficient documentation

## 2018-11-14 DIAGNOSIS — R4182 Altered mental status, unspecified: Secondary | ICD-10-CM | POA: Diagnosis not present

## 2018-11-14 LAB — COMPREHENSIVE METABOLIC PANEL
ALK PHOS: 149 U/L — AB (ref 38–126)
ALT: 70 U/L — AB (ref 0–44)
AST: 70 U/L — ABNORMAL HIGH (ref 15–41)
Albumin: 3.6 g/dL (ref 3.5–5.0)
Anion gap: 10 (ref 5–15)
BUN: 40 mg/dL — ABNORMAL HIGH (ref 8–23)
CALCIUM: 10.2 mg/dL (ref 8.9–10.3)
CO2: 23 mmol/L (ref 22–32)
Chloride: 100 mmol/L (ref 98–111)
Creatinine, Ser: 1.32 mg/dL — ABNORMAL HIGH (ref 0.61–1.24)
GFR calc Af Amer: 60 mL/min — ABNORMAL LOW (ref >60)
GFR calc non Af Amer: 52 mL/min — ABNORMAL LOW (ref >60)
Glucose, Bld: 228 mg/dL — ABNORMAL HIGH (ref 70–99)
Potassium: 5 mmol/L (ref 3.5–5.1)
Sodium: 133 mmol/L — ABNORMAL LOW (ref 135–145)
Total Bilirubin: 1.1 mg/dL (ref 0.3–1.2)
Total Protein: 7.3 g/dL (ref 6.5–8.1)

## 2018-11-14 LAB — CBC WITH DIFFERENTIAL/PLATELET
ABS IMMATURE GRANULOCYTES: 0.24 10*3/uL — AB (ref 0.00–0.07)
Basophils Absolute: 0 10*3/uL (ref 0.0–0.1)
Basophils Relative: 0 %
Eosinophils Absolute: 0.2 10*3/uL (ref 0.0–0.5)
Eosinophils Relative: 3 %
HCT: 38.8 % — ABNORMAL LOW (ref 39.0–52.0)
Hemoglobin: 12.3 g/dL — ABNORMAL LOW (ref 13.0–17.0)
Immature Granulocytes: 4 %
Lymphocytes Relative: 6 %
Lymphs Abs: 0.3 10*3/uL — ABNORMAL LOW (ref 0.7–4.0)
MCH: 29.6 pg (ref 26.0–34.0)
MCHC: 31.7 g/dL (ref 30.0–36.0)
MCV: 93.5 fL (ref 80.0–100.0)
MONO ABS: 0.1 10*3/uL (ref 0.1–1.0)
Monocytes Relative: 2 %
Neutro Abs: 4.9 10*3/uL (ref 1.7–7.7)
Neutrophils Relative %: 85 %
Platelets: 68 10*3/uL — ABNORMAL LOW (ref 150–400)
RBC: 4.15 MIL/uL — ABNORMAL LOW (ref 4.22–5.81)
RDW: 18.8 % — ABNORMAL HIGH (ref 11.5–15.5)
WBC: 5.8 10*3/uL (ref 4.0–10.5)
nRBC: 0 % (ref 0.0–0.2)

## 2018-11-14 LAB — TROPONIN I: Troponin I: <0.03 ng/mL (ref <0.03)

## 2018-11-14 LAB — CBG MONITORING, ED
Glucose-Capillary: 239 mg/dL — ABNORMAL HIGH (ref 70–99)
Glucose-Capillary: 148 mg/dL — ABNORMAL HIGH (ref 70–99)

## 2018-11-14 MED ORDER — SODIUM CHLORIDE 0.9 % IV SOLN
510.0000 mg | Freq: Once | INTRAVENOUS | Status: AC
  Administered 2018-11-14: 510 mg via INTRAVENOUS
  Filled 2018-11-14: qty 17

## 2018-11-14 MED ORDER — SODIUM CHLORIDE 0.9 % IV SOLN
Freq: Once | INTRAVENOUS | Status: AC
  Administered 2018-11-14: 13:00:00 via INTRAVENOUS
  Filled 2018-11-14: qty 250

## 2018-11-14 NOTE — Progress Notes (Signed)
1252 Patients Feraheme hung.  Patient alert and oriented.  1255. Patient states he felt lightheaded then Patient became unresponsive.  Feraheme stopped.  [name] and Laverna Peace NP here to assess patient.  .9 Ns infusing Wide open.  O2 placed at 2 L [place].  O2 sat 98%.  1300 Patient BP 147/120, pulse 77.  1300  Patient gradually becoming more alert .  1305 Bp 135/51. Patient conversing with Dr, Marin Olp, alert and oriented.  Patient to be transported to ED downstairs per [name].  Iv Capped 1320  Patient transported to ED via wheelchair with Nurse and wife.

## 2018-11-14 NOTE — ED Notes (Signed)
ED Provider at bedside. 

## 2018-11-14 NOTE — ED Provider Notes (Signed)
I received this patient in signout.  He had presented with syncopal episode that sounded like vasovagal syncope in the setting of getting iron transfusion however given his cardiac history, we were obtaining pacemaker interrogation.  Interrogation showed no cardiac events to correspond with the patient's syncopal episode.  I discussed findings with patient and wife as well as other work-up results.  They will follow-up with his cardiologist and understand return precautions.   [name], [name], MD 11/14/18 [phone]

## 2018-11-14 NOTE — ED Notes (Addendum)
Magda Paganini with [name] called regarding pt pacemaker interrogation. She states everything looks ok-interrogation normal. EDP Little notified

## 2018-11-14 NOTE — ED Provider Notes (Signed)
[facility] EMERGENCY DEPARTMENT Provider Note   CSN: [id number] Arrival date & time: 11/14/18  1318    History   Chief Complaint Chief Complaint  Patient presents with  . Loss of Consciousness    HPI Robert Gregory is a 78 y.o. male with PMH significant for anemia, thrombocytopenia, A. fib on Xarelto, CAD, diabetes, CHF, hypertension, hyperlipidemia.      Patient had just started his iron transfusion earlier today when he had a episode where he lost consciousness.  He recalls feeling dizzy with room spinning prior to this episode.  He did not have any chest pain, shortness of breath, diaphoresis, nausea at the time.  He had no associated vision changes, tinnitus, hearing loss.  The next thing he recalls was several people standing over him.  His wife witnessed the event and apparently he did have some shaking.  He had some bladder incontinence but no bowel incontinence or tongue biting.  He has no chest pain now and feels well. Note he has had recurrent cellulitis since December.  He last saw his doctor for this on Thursday as it had recently worsened.  He has been on Keflex 3 times daily since Friday and wife at bedside says that it is continued to improve.  He has no fevers or chills.       Past Medical History:  Diagnosis Date  . Arthritis   . Atrial fibrillation ([place])    takes Xarelto daily  . CAD (coronary artery disease)   . Cardiomyopathy, ischemic 11/07/2013  . Cholelithiasis   . Chronic back pain    scoliosis/arthritis  . Complication of anesthesia    forgetful for about 2-3wks after anesthesia  . Congestive heart failure ([place])    takes Spironolactone daily  . Diabetes mellitus ([place])    takes Invokamet daily as well as Hum U  . History of colon polyps   . History of kidney stones   . Hyperlipidemia    takes Sanmina-SCI daily  . Hyperlipidemia 11/07/2013  . Hypertension    takes Benicar and Coreg daily  . ICD (implantable  cardioverter-defibrillator) battery depletion   . ICD (implantable cardioverter-defibrillator) in place 11/07/2013  . Iron deficiency anemia due to chronic blood loss 08/24/2018  . Iron malabsorption 08/24/2018  . Ischemic cardiomyopathy   . Ischemic cardiomyopathy   . Joint pain   . Myocardial infarction ([place]) 1997   on the OR table   . Nephrolithiasis   . Obstructive sleep apnea   . Peripheral neuropathy   . Pernicious anemia 08/24/2018  . Pneumonia    as a child  . Spinal stenosis   . Thrombocytopenia ([place])   . Total knee replacement status 11/29/2014  . Type 2 diabetes mellitus with stage 3 chronic kidney disease ([place]) 05/19/2015  . Urinary frequency   . Urinary urgency     Patient Active Problem List   Diagnosis Date Noted  . Cellulitis of right lower extremity   . Sepsis ([place]) 09/07/2018  . Iron deficiency anemia due to chronic blood loss 08/24/2018  . Iron malabsorption 08/24/2018  . Pernicious anemia 08/24/2018  . Obesity, Class III, BMI 40-49.9 (morbid obesity) ([place]) 05/06/2017  . Splenomegaly, not elsewhere classified 10/27/2016  . Renal mass, left 10/27/2016  . Abnormal nuclear stress test   . Obstructive sleep apnea   . Nephrolithiasis   . Thrombocytopenia ([place])   . Cholelithiasis   . ICD (implantable cardioverter-defibrillator) battery depletion   . Ischemic cardiomyopathy   .  Hypertension   . Myocardial infarction ([place])   . Pneumonia   . Peripheral neuropathy   . Joint pain   . Chronic back pain   . History of colon polyps   . Urinary frequency   . History of kidney stones   . Type 2 diabetes mellitus with stage 3 chronic kidney disease ([place]) 05/19/2015  . H/O total knee replacement, right 11/29/2014  . Pre-operative cardiovascular examination 11/20/2014  . CAD (coronary artery disease) 11/07/2013  . Atrial fibrillation ([place]) 11/07/2013  . ICD (implantable cardioverter-defibrillator) in place 11/07/2013  . Essential hypertension 11/07/2013  .  Hyperlipidemia 11/07/2013  . Cardiomyopathy, ischemic 11/07/2013    Past Surgical History:  Procedure Laterality Date  . CARDIAC CATHETERIZATION N/A 10/02/2015   Procedure: Left Heart Cath and Cors/Grafts Angiography;  Surgeon: Belva Crome, MD;  Location: [facility];  Service: Cardiovascular;  Laterality: N/A;  . COLONOSCOPY    . coronary artery bypass and graft  1997   x 5  . ESOPHAGOGASTRODUODENOSCOPY    . ICD placed    . LAMINECTOMY    . LITHOTRIPSY    . Right rotator cuff surgery    . TONSILLECTOMY     adenoids  . TOTAL KNEE ARTHROPLASTY Left   . TOTAL KNEE ARTHROPLASTY Right 11/29/2014   Procedure: RIGHT TOTAL KNEE ARTHROPLASTY;  Surgeon: Leandrew Koyanagi, MD;  Location: [facility];  Service: Orthopedics;  Laterality: Right;        Home Medications    Prior to Admission medications   Medication Sig Start Date End Date Taking? Authorizing Provider  cephALEXin (KEFLEX) 500 MG capsule Take 500 mg by mouth 3 (three) times daily.   Yes [provider]  amoxicillin (AMOXIL) 500 MG capsule Take 500 mg by mouth as directed. 6 prior to dental work    [provider]  BD INSULIN SYRINGE U-500 31G X 6MM 0.5 ML MISC USE 1 SYRINGE TO INJECT UNDER THE SKIN THREE TIMES A DAY BEFORE MEALS 11/11/17   Philemon Kingdom, MD  BENICAR 40 MG tablet TAKE 1 TABLET TWICE A DAY 03/13/18   Lelon Perla, MD  carvedilol (COREG) 6.25 MG tablet Take 1 tablet (6.25 mg total) by mouth 2 (two) times daily with a meal. 05/31/18   Lelon Perla, MD  Cholecalciferol (VITAMIN D) 50 MCG (2000 UT) tablet Take 2,000 Units by mouth daily.    [provider]  Coenzyme Q10 200 MG TABS Take 1 tablet by mouth daily.    [provider]  Cranberry 200 MG CAPS Take 200 mg by mouth 2 (two) times a week. Take on Tues & Thurs    [provider]  ezetimibe (ZETIA) 10 MG tablet TAKE 1 TABLET DAILY Patient taking differently: Take 10 mg by mouth daily.  07/11/18   Lelon Perla, MD  insulin regular human CONCENTRATED (HUMULIN R) 500 UNIT/ML injection Inject up to 200 units a day as advised Patient taking differently: Inject 100-140 Units into the skin 2 (two) times daily with a meal. Take 140 units at breakfast and Take 100 units at lunch. 06/29/18   Philemon Kingdom, MD  metFORMIN (GLUCOPHAGE-XR) 500 MG 24 hr tablet Take 1000 mg with lunch 09/15/18   Philemon Kingdom, MD  Multiple Vitamins-Minerals (CENTRUM SILVER PO) Take 1 tablet by mouth daily.    [provider]  nitroGLYCERIN (NITROSTAT) 0.4 MG SL tablet Place 1 tablet (0.4 mg total) under the tongue every 5 (five) minutes as needed for  chest pain. 07/03/18   Lelon Perla, MD  NON FORMULARY Take 1 tablet by mouth daily. Methyfolate 2000 MCG daily     [provider]  omega-3 acid ethyl esters (LOVAZA) 1 g capsule TAKE 2 CAPSULES TWICE A DAY Patient taking differently: Take 2 g by mouth 2 (two) times daily. TAKE 2 CAPSULES TWICE A DAY 04/04/18   Lelon Perla, MD  Peppermint Oil (IBGARD) 90 MG CPCR Take 90 mg by mouth 2 (two) times daily.    [provider]  potassium chloride (K-DUR) 10 MEQ tablet Take 10 mEq by mouth 2 (two) times daily. Take two tablets in the morning and two tablets in the evening. 10/28/18   [provider]  pravastatin (PRAVACHOL) 80 MG tablet TAKE 1 TABLET DAILY Patient taking differently: Take 80 mg by mouth daily.  03/13/18   Lelon Perla, MD  Probiotic Product (PROBIOTIC DAILY PO) Take 1 tablet by mouth daily.    [provider]  TRICOR 48 MG tablet TAKE 1 TABLET DAILY Patient taking differently: Take 48 mg by mouth daily.  07/11/18   Lelon Perla, MD  XARELTO 20 MG TABS tablet TAKE 1 TABLET DAILY WITH SUPPER Patient taking differently: Take 20 mg by mouth every evening.  03/13/18   Stanford Breed Denice Bors, MD    Family History Family History  Problem Relation Age of Onset  . Heart disease Other        No family history     Social History Social History   Tobacco Use  . Smoking status: Never Smoker  . Smokeless tobacco: Never Used  Substance Use Topics  . Alcohol use: Yes    Alcohol/week: 0.0 standard drinks    Comment: once drink a month  . Drug use: No     Allergies   Iodine; Seasonal ic [cholestatin]; Tape; Contrast media [iodinated diagnostic agents]; and Ioxaglate   Review of Systems Review of Systems  Constitutional: Negative for chills and fever.  Eyes: Negative for visual disturbance.  Respiratory: Negative for shortness of breath and wheezing.   Cardiovascular: Negative for chest pain.  Gastrointestinal: Negative for abdominal pain, diarrhea, nausea and vomiting.  Skin: Positive for rash (R lower leg chronic cellulitis).  Neurological: Positive for dizziness and syncope. Negative for tremors, facial asymmetry, speech difficulty, weakness, light-headedness, numbness and headaches.     Physical Exam Updated Vital Signs BP (!) 126/59   Pulse 79   Temp 98 F (36.7 C) (Oral)   Resp (!) 22   Ht 6' 1"  (1.854 m)   Wt (!) 140.6 kg   SpO2 97%   BMI 40.90 kg/m   Physical Exam Constitutional:      General: He is not in acute distress.    Appearance: Normal appearance. He is obese. He is not ill-appearing or diaphoretic.  HENT:     Head: Normocephalic and atraumatic.     Nose: Nose normal.     Mouth/Throat:     Mouth: Mucous membranes are moist.  Eyes:     Extraocular Movements: Extraocular movements intact.     Conjunctiva/sclera: Conjunctivae normal.     Pupils: Pupils are equal, round, and reactive to light.  Neck:     Musculoskeletal: Normal range of motion and neck supple.  Cardiovascular:     Rate and Rhythm: Normal rate and regular rhythm.     Heart sounds: Normal heart sounds. No murmur. No gallop.      Comments: No carotid bruits on auscultation Pulmonary:  Effort: Pulmonary effort is normal.     Breath sounds: Normal breath sounds. No wheezing or rhonchi.   Abdominal:     General: Abdomen is flat. Bowel sounds are normal.     Tenderness: There is no abdominal tenderness. There is no guarding or rebound.  Skin:    Findings: Rash (R ankle with erythema and edema with dressing clean and dry) present.  Neurological:     General: No focal deficit present.     Mental Status: He is alert and oriented to person, place, and time.     Cranial Nerves: No cranial nerve deficit, dysarthria or facial asymmetry.     Sensory: No sensory deficit.     Motor: No weakness or abnormal muscle tone.     Coordination: Coordination normal.  Psychiatric:        Mood and Affect: Mood normal.        Behavior: Behavior normal.      ED Treatments / Results  Labs (all labs ordered are listed, but only abnormal results are displayed) Labs Reviewed  CBC WITH DIFFERENTIAL/PLATELET - Abnormal; Notable for the following components:      Result Value   RBC 4.15 (*)    Hemoglobin 12.3 (*)    HCT 38.8 (*)    RDW 18.8 (*)    Platelets 68 (*)    Lymphs Abs 0.3 (*)    Abs Immature Granulocytes 0.24 (*)    All other components within normal limits  COMPREHENSIVE METABOLIC PANEL - Abnormal; Notable for the following components:   Sodium 133 (*)    Glucose, Bld 228 (*)    BUN 40 (*)    Creatinine, Ser 1.32 (*)    AST 70 (*)    ALT 70 (*)    Alkaline Phosphatase 149 (*)    GFR calc non Af Amer 52 (*)    GFR calc Af Amer 60 (*)    All other components within normal limits  CBG MONITORING, ED - Abnormal; Notable for the following components:   Glucose-Capillary 239 (*)    All other components within normal limits  TROPONIN I    EKG EKG Interpretation  Date/Time:  Tuesday November 14 2018 13:35:23 EST Ventricular Rate:  78 PR Interval:    QRS Duration: 160 QT Interval:  474 QTC Calculation: 540 R Axis:   -43 Text Interpretation:  Sinus or ectopic atrial rhythm Prolonged PR interval Right bundle branch block No significant change since last tracing  Confirmed by Gareth Morgan [phone]) on 11/14/2018 1:45:19 PM   Radiology No results found.  Procedures Procedures (including critical care time)  Medications Ordered in ED Medications - No data to display   Initial Impression / Assessment and Plan / ED Course  I have reviewed the triage vital signs and the nursing notes.  Pertinent labs & imaging results that were available during my care of the patient were reviewed by me and considered in my medical decision making (see chart for details).        Patient presented after a witnessed syncopal event during his iron infusion today.  His blood work is unremarkable with his cell lines at his baseline.  His troponin is negative and his EKG does not show any acute changes.  He did have some witnessed twitching and bladder incontinence but did not have a postictal episode so unlikely seizure.  CT head neg for acute abnormality. ICD interrogation pending, if negative likely can be DC home.    Final Clinical  Impressions(s) / ED Diagnoses   Final diagnoses:  Syncope, unspecified syncope type    ED Discharge Orders    None       Bufford Lope, DO 11/14/18 1545    Gareth Morgan, MD 11/14/18 1714

## 2018-11-14 NOTE — ED Triage Notes (Signed)
Pt sent from infusion center for eval , syncopal episode during iron infusion.

## 2018-11-14 NOTE — ED Notes (Addendum)
Pt states he was getting transfusion and "passed out". He states he became disoriented and lightheaded and verbalized that to staff. Wife states pt was unconscious for approx 30 seconds. Pt did not fall from chair per wife. Denies any head pain, states he has back pain but "that is normal for me". Pt states he "feels ok, not totally refocused but feels ok"

## 2018-11-14 NOTE — ED Notes (Signed)
Pts wife asking what the wait is. Informed her that I would speak with prvider and make aware that they have questions

## 2018-11-14 NOTE — ED Notes (Signed)
Patient transported to CT 

## 2018-11-15 ENCOUNTER — Telehealth: Payer: Self-pay | Admitting: Cardiology

## 2018-11-15 NOTE — Telephone Encounter (Signed)
  Wife is calling because patient was seen in the ED on 11/14/18 and Robert Gregory was told to follow up with Robert Gregory. Robert Gregory would like to speak with Robert Gregory regarding an appointment. I checked Robert Gregory schedule at [facility] and [facility] and the earliest available was 11/16/18 which she stated he could not come tomorrow due to another appt. I offered 11/29/18 but Robert. Gregory states that is not soon enough and would like a call back from the nurse.

## 2018-11-15 NOTE — Telephone Encounter (Signed)
Spoke with wife .  She was following instruction from ER. PATIENT HAS AN APPOINTMENT WITH DR Lovena Le ON 11/23/18. NEXT AVAILABLE WITH DR CRENSHAW WILL BE 11/29/18 IN [place].  PER WIFE PATIENT HAS SEVERAL APPOINTMENTS WITH DIFFERENT DOCTORS.  RN REASSURED WIFE THAT  DR Lovena Le COULD EVALUATE THE   RECENT ER VISIT AND IF AN APPOINTMENT IS STILL NEEDED PATIENT COULD KEEP APPOINTMENT ON 11/29/18 WITH DR CRENSHAW.  WIFE  STATES UNDERSTANDING.

## 2018-11-16 ENCOUNTER — Telehealth: Payer: Self-pay | Admitting: *Deleted

## 2018-11-16 DIAGNOSIS — M546 Pain in thoracic spine: Secondary | ICD-10-CM | POA: Diagnosis not present

## 2018-11-16 DIAGNOSIS — M545 Low back pain: Secondary | ICD-10-CM | POA: Diagnosis not present

## 2018-11-16 DIAGNOSIS — M47814 Spondylosis without myelopathy or radiculopathy, thoracic region: Secondary | ICD-10-CM | POA: Diagnosis not present

## 2018-11-16 DIAGNOSIS — M4200 Juvenile osteochondrosis of spine, site unspecified: Secondary | ICD-10-CM | POA: Diagnosis not present

## 2018-11-16 NOTE — Telephone Encounter (Signed)
Call received from patient's wife requiring if missed Feraheme dose on 11/14/18 needs to be rescheduled.  [name] notified and order received for pt to reschedule missed dose of Feraheme to be given along with pre meds. Call placed back to patient's wife to notify her of Dr. Antonieta Pert orders.  Pt.'s wife appreciative of call back and has no questions at this time.  New appt time given to patient's wife.

## 2018-11-20 DIAGNOSIS — M62830 Muscle spasm of back: Secondary | ICD-10-CM | POA: Diagnosis not present

## 2018-11-20 DIAGNOSIS — M419 Scoliosis, unspecified: Secondary | ICD-10-CM | POA: Diagnosis not present

## 2018-11-20 DIAGNOSIS — M545 Low back pain: Secondary | ICD-10-CM | POA: Diagnosis not present

## 2018-11-20 DIAGNOSIS — R600 Localized edema: Secondary | ICD-10-CM | POA: Diagnosis not present

## 2018-11-20 DIAGNOSIS — E875 Hyperkalemia: Secondary | ICD-10-CM | POA: Diagnosis not present

## 2018-11-20 DIAGNOSIS — L0390 Cellulitis, unspecified: Secondary | ICD-10-CM | POA: Diagnosis not present

## 2018-11-21 ENCOUNTER — Other Ambulatory Visit: Payer: Self-pay

## 2018-11-21 ENCOUNTER — Inpatient Hospital Stay: Payer: Medicare Other | Attending: Hematology & Oncology

## 2018-11-21 ENCOUNTER — Ambulatory Visit
Admission: RE | Admit: 2018-11-21 | Discharge: 2018-11-21 | Disposition: A | Discharge status: Home / Self Care | Payer: Medicare Other | Source: Ambulatory Visit | Attending: Gastroenterology | Admitting: Gastroenterology

## 2018-11-21 VITALS — BP 112/51 | HR 67 | Temp 98.2°F | Resp 20

## 2018-11-21 DIAGNOSIS — K7469 Other cirrhosis of liver: Secondary | ICD-10-CM

## 2018-11-21 DIAGNOSIS — Z79899 Other long term (current) drug therapy: Secondary | ICD-10-CM | POA: Diagnosis not present

## 2018-11-21 DIAGNOSIS — K7460 Unspecified cirrhosis of liver: Secondary | ICD-10-CM | POA: Diagnosis not present

## 2018-11-21 DIAGNOSIS — D500 Iron deficiency anemia secondary to blood loss (chronic): Secondary | ICD-10-CM

## 2018-11-21 DIAGNOSIS — K909 Intestinal malabsorption, unspecified: Secondary | ICD-10-CM

## 2018-11-21 DIAGNOSIS — D510 Vitamin B12 deficiency anemia due to intrinsic factor deficiency: Secondary | ICD-10-CM | POA: Insufficient documentation

## 2018-11-21 MED ORDER — SODIUM CHLORIDE 0.9 % IV SOLN
750.0000 mg | Freq: Once | INTRAVENOUS | Status: AC
  Administered 2018-11-21: 750 mg via INTRAVENOUS
  Filled 2018-11-21: qty 15

## 2018-11-21 MED ORDER — FAMOTIDINE 20 MG PO TABS: 20.0000 mg | ORAL_TABLET | Freq: Once | ORAL | Status: DC

## 2018-11-21 MED ORDER — FAMOTIDINE IN NACL 20-0.9 MG/50ML-% IV SOLN
20.0000 mg | Freq: Once | INTRAVENOUS | Status: AC
  Administered 2018-11-21: 20 mg via INTRAVENOUS

## 2018-11-21 MED ORDER — SODIUM CHLORIDE 0.9 % IV SOLN
Freq: Once | INTRAVENOUS | Status: AC
  Administered 2018-11-21: 13:00:00 via INTRAVENOUS
  Filled 2018-11-21: qty 250

## 2018-11-21 MED ORDER — SODIUM CHLORIDE 0.9 % IV SOLN: 510.0000 mg | Freq: Once | INTRAVENOUS | Status: DC

## 2018-11-21 MED ORDER — LORATADINE 10 MG PO TABS
10.0000 mg | ORAL_TABLET | Freq: Once | ORAL | Status: AC
  Administered 2018-11-21: 10 mg via ORAL
  Filled 2018-11-21: qty 1

## 2018-11-21 MED ORDER — FAMOTIDINE IN NACL 20-0.9 MG/50ML-% IV SOLN
INTRAVENOUS | Status: AC
  Filled 2018-11-21: qty 50

## 2018-11-21 NOTE — Patient Instructions (Signed)

## 2018-11-22 ENCOUNTER — Ambulatory Visit (INDEPENDENT_AMBULATORY_CARE_PROVIDER_SITE_OTHER): Payer: Medicare Other | Admitting: *Deleted

## 2018-11-22 DIAGNOSIS — I255 Ischemic cardiomyopathy: Secondary | ICD-10-CM

## 2018-11-22 NOTE — Progress Notes (Deleted)
HPI: FU coronary artery disease. Patient is status post coronary artery bypassing graft in 1997. Patient had a LIMA to the LAD, saphenous vein graft to the diagonal, saphenous vein graft to the circumflex and sequential saphenous vein graft to the PDA and posterior lateral. His procedure was performed at [facility]. He subsequently moved to [facility]. Echocardiogram in 2005 showed an ejection fraction of 38%, restrictive filling and trace tricuspid regurgitation. Patient had ICD placed previously. Also with h/o atrial fibrillation. Nuclear study 12/16 showed EF 50; prior inferior MI with mild peri-infarct ischemia and moderate ischemia in the anterolateral wall. Cardiac catheterization January 2017 showed severe three-vessel coronary disease. There was total occlusion of the saphenous vein graft to the obtuse marginal, total occlusion of the saphenous vein graft to diagonal and there was a 50-70% stenosis in the sequential vein graft to the PDA and left ventricular branch and the LIMA to the LAD was patent. Ejection fraction 35-45%. Medical therapy recommended. Abdominal ultrasound August 2017 showed no aneurysm. Abdominal CT 9/19 shows cirrhosis, portal hypertension and splenomegaly. Echocardiogram December 2019 showed normal LV function.  Admitted December 2019 with cellulitis.  Patient treated with antibiotics and hydration.  Noted to have acute renal insufficiency and diuretics held.  After discharge began having increased edema and diuretics resumed.  Patient seen with syncope February 2020.  Occurred during iron infusion.  Head CT negative.  Felt likely vagal because of infusion.  Interrogation of device reportedly negative.  Troponin normal.  BUN 40 and creatinine 1.32.  Hemoglobin 12.3.  Alkaline phosphatase 149, SGOT 70 and SGPT 70.  Liver ultrasound March 2020 showed cirrhotic changes and indeterminate foci in the liver and MRI with and without recommended. Since last seen,  Current  Outpatient Medications  Medication Sig Dispense Refill  . amoxicillin (AMOXIL) 500 MG capsule Take 500 mg by mouth as directed. 6 prior to dental work    . BD INSULIN SYRINGE U-500 31G X 6MM 0.5 ML MISC USE 1 SYRINGE TO INJECT UNDER THE SKIN THREE TIMES A DAY BEFORE MEALS 300 each 3  . BENICAR 40 MG tablet TAKE 1 TABLET TWICE A DAY 180 tablet 3  . carvedilol (COREG) 6.25 MG tablet Take 1 tablet (6.25 mg total) by mouth 2 (two) times daily with a meal. 90 tablet 3  . cephALEXin (KEFLEX) 500 MG capsule Take 500 mg by mouth 3 (three) times daily.    . Cholecalciferol (VITAMIN D) 50 MCG (2000 UT) tablet Take 2,000 Units by mouth daily.    . Coenzyme Q10 200 MG TABS Take 1 tablet by mouth daily.    . Cranberry 200 MG CAPS Take 200 mg by mouth 2 (two) times a week. Take on Tues & Thurs    . ezetimibe (ZETIA) 10 MG tablet TAKE 1 TABLET DAILY (Patient taking differently: Take 10 mg by mouth daily. ) 90 tablet 4  . insulin regular human CONCENTRATED (HUMULIN R) 500 UNIT/ML injection Inject up to 200 units a day as advised (Patient taking differently: Inject 100-140 Units into the skin 2 (two) times daily with a meal. Take 140 units at breakfast and Take 100 units at lunch.) 3 vial 3  . metFORMIN (GLUCOPHAGE-XR) 500 MG 24 hr tablet Take 1000 mg with lunch 180 tablet 3  . Multiple Vitamins-Minerals (CENTRUM SILVER PO) Take 1 tablet by mouth daily.    . nitroGLYCERIN (NITROSTAT) 0.4 MG SL tablet Place 1 tablet (0.4 mg total) under the tongue every 5 (five) minutes  as needed for chest pain. 75 tablet 3  . NON FORMULARY Take 1 tablet by mouth daily. Methyfolate 2000 MCG daily     . omega-3 acid ethyl esters (LOVAZA) 1 g capsule TAKE 2 CAPSULES TWICE A DAY (Patient taking differently: Take 2 g by mouth 2 (two) times daily. TAKE 2 CAPSULES TWICE A DAY) 360 capsule 1  . Peppermint Oil (IBGARD) 90 MG CPCR Take 90 mg by mouth 2 (two) times daily.    . potassium chloride (K-DUR) 10 MEQ tablet Take 10 mEq by mouth 2  (two) times daily. Take two tablets in the morning and two tablets in the evening.    . pravastatin (PRAVACHOL) 80 MG tablet TAKE 1 TABLET DAILY (Patient taking differently: Take 80 mg by mouth daily. ) 90 tablet 3  . Probiotic Product (PROBIOTIC DAILY PO) Take 1 tablet by mouth daily.    . TRICOR 48 MG tablet TAKE 1 TABLET DAILY (Patient taking differently: Take 48 mg by mouth daily. ) 90 tablet 4  . XARELTO 20 MG TABS tablet TAKE 1 TABLET DAILY WITH SUPPER (Patient taking differently: Take 20 mg by mouth every evening. ) 90 tablet 3   No current facility-administered medications for this visit.      Past Medical History:  Diagnosis Date  . Arthritis   . Atrial fibrillation ([place])    takes Xarelto daily  . CAD (coronary artery disease)   . Cardiomyopathy, ischemic 11/07/2013  . Cholelithiasis   . Chronic back pain    scoliosis/arthritis  . Complication of anesthesia    forgetful for about 2-3wks after anesthesia  . Congestive heart failure ([place])    takes Spironolactone daily  . Diabetes mellitus ([place])    takes Invokamet daily as well as Hum U  . History of colon polyps   . History of kidney stones   . Hyperlipidemia    takes Sanmina-SCI daily  . Hyperlipidemia 11/07/2013  . Hypertension    takes Benicar and Coreg daily  . ICD (implantable cardioverter-defibrillator) battery depletion   . ICD (implantable cardioverter-defibrillator) in place 11/07/2013  . Iron deficiency anemia due to chronic blood loss 08/24/2018  . Iron malabsorption 08/24/2018  . Ischemic cardiomyopathy   . Ischemic cardiomyopathy   . Joint pain   . Myocardial infarction ([place]) 1997   on the OR table   . Nephrolithiasis   . Obstructive sleep apnea   . Peripheral neuropathy   . Pernicious anemia 08/24/2018  . Pneumonia    as a child  . Spinal stenosis   . Thrombocytopenia ([place])   . Total knee replacement status 11/29/2014  . Type 2 diabetes mellitus with stage 3 chronic kidney disease  ([place]) 05/19/2015  . Urinary frequency   . Urinary urgency     Past Surgical History:  Procedure Laterality Date  . CARDIAC CATHETERIZATION N/A 10/02/2015   Procedure: Left Heart Cath and Cors/Grafts Angiography;  Surgeon: Belva Crome, MD;  Location: [facility];  Service: Cardiovascular;  Laterality: N/A;  . COLONOSCOPY    . coronary artery bypass and graft  1997   x 5  . ESOPHAGOGASTRODUODENOSCOPY    . ICD placed    . LAMINECTOMY    . LITHOTRIPSY    . Right rotator cuff surgery    . TONSILLECTOMY     adenoids  . TOTAL KNEE ARTHROPLASTY Left   . TOTAL KNEE ARTHROPLASTY Right 11/29/2014   Procedure: RIGHT TOTAL KNEE ARTHROPLASTY;  Surgeon: Leandrew Koyanagi, MD;  Location: [facility];  Service: Orthopedics;  Laterality: Right;    Social History   Socioeconomic History  . Marital status: Married    Spouse name: Not on file  . Number of children: Not on file  . Years of education: Not on file  . Highest education level: Not on file  Occupational History    Comment: Retired from [employer]  . Financial resource strain: Not on file  . Food insecurity:    Worry: Not on file    Inability: Not on file  . Transportation needs:    Medical: Not on file    Non-medical: Not on file  Tobacco Use  . Smoking status: Never Smoker  . Smokeless tobacco: Never Used  Substance and Sexual Activity  . Alcohol use: Yes    Alcohol/week: 0.0 standard drinks    Comment: once drink a month  . Drug use: No  . Sexual activity: Not on file  Lifestyle  . Physical activity:    Days per week: Not on file    Minutes per session: Not on file  . Stress: Not on file  Relationships  . Social connections:    Talks on phone: Not on file    Gets together: Not on file    Attends religious service: Not on file    Active member of club or organization: Not on file    Attends meetings of clubs or organizations: Not on file    Relationship status: Not on file  . Intimate partner violence:     Fear of current or ex partner: Not on file    Emotionally abused: Not on file    Physically abused: Not on file    Forced sexual activity: Not on file  Other Topics Concern  . Not on file  Social History Narrative  . Not on file    Family History  Problem Relation Age of Onset  . Heart disease Other        No family history    ROS: no fevers or chills, productive cough, hemoptysis, dysphasia, odynophagia, melena, hematochezia, dysuria, hematuria, rash, seizure activity, orthopnea, PND, pedal edema, claudication. Remaining systems are negative.  Physical Exam: Well-developed well-nourished in no acute distress.  Skin is warm and dry.  HEENT is normal.  Neck is supple.  Chest is clear to auscultation with normal expansion.  Cardiovascular exam is regular rate and rhythm.  Abdominal exam nontender or distended. No masses palpated. Extremities show no edema. neuro grossly intact  ECG- personally reviewed  A/P  1  Kirk Ruths, MD

## 2018-11-23 ENCOUNTER — Ambulatory Visit (INDEPENDENT_AMBULATORY_CARE_PROVIDER_SITE_OTHER): Payer: Medicare Other | Admitting: Internal Medicine

## 2018-11-23 ENCOUNTER — Encounter: Payer: Self-pay | Admitting: Internal Medicine

## 2018-11-23 VITALS — BP 122/64 | HR 80 | Ht 73.0 in | Wt 314.4 lb

## 2018-11-23 DIAGNOSIS — Z95810 Presence of automatic (implantable) cardiac defibrillator: Secondary | ICD-10-CM

## 2018-11-23 DIAGNOSIS — I480 Paroxysmal atrial fibrillation: Secondary | ICD-10-CM

## 2018-11-23 DIAGNOSIS — I255 Ischemic cardiomyopathy: Secondary | ICD-10-CM

## 2018-11-23 DIAGNOSIS — K7460 Unspecified cirrhosis of liver: Secondary | ICD-10-CM | POA: Diagnosis not present

## 2018-11-23 LAB — CUP PACEART INCLINIC DEVICE CHECK
Battery Remaining Longevity: 50 mo
Brady Statistic RA Percent Paced: 0 %
Brady Statistic RV Percent Paced: 0.03 %
Date Time Interrogation Session: 20200305194638
HighPow Impedance: 84 Ohm
Implantable Lead Implant Date: 20140320
Implantable Lead Implant Date: 20140320
Implantable Lead Location: 753859
Lead Channel Impedance Value: 375 Ohm
Lead Channel Impedance Value: 412.5 Ohm
Lead Channel Pacing Threshold Pulse Width: 0.5 ms
Lead Channel Sensing Intrinsic Amplitude: 11.2 mV
Lead Channel Sensing Intrinsic Amplitude: 2.2 mV
Lead Channel Setting Pacing Amplitude: 2 V
Lead Channel Setting Pacing Pulse Width: 0.5 ms
Lead Channel Setting Sensing Sensitivity: 0.5 mV
MDC IDC LEAD LOCATION: 753860
MDC IDC MSMT LEADCHNL RV PACING THRESHOLD AMPLITUDE: 0.75 V
MDC IDC PG IMPLANT DT: 20140320
Pulse Gen Serial Number: 7067730

## 2018-11-23 NOTE — Progress Notes (Signed)
HPI Mr. Chaikin returns today for ICD followup. He is a pleasant 78 yo man with a h/o atrial fib, chronic systolic heart failure, and obesity. He has been in the ED with syncope which was likely due to orthostasis. He had no arrhythmias documented. He has been bothered by some peripheral edema.  Allergies  Allergen Reactions  . Feraheme [Ferumoxytol] Other (See Comments)    Patient has reaction during infusion in Feb 2020 decision made to change to [facility]  . Iodine     flushing  . Seasonal Ic [Cholestatin]   . Tape Other (See Comments)    Rash  Rash   . Contrast Media [Iodinated Diagnostic Agents] Rash and Other (See Comments)    flushing EXTREME FLUSHING   . Ioxaglate Other (See Comments) and Rash    flushing EXTREME FLUSHING      Current Outpatient Medications  Medication Sig Dispense Refill  . amoxicillin (AMOXIL) 500 MG capsule Take 500 mg by mouth as directed. 6 prior to dental work    . BD INSULIN SYRINGE U-500 31G X 6MM 0.5 ML MISC USE 1 SYRINGE TO INJECT UNDER THE SKIN THREE TIMES A DAY BEFORE MEALS 300 each 3  . BENICAR 40 MG tablet TAKE 1 TABLET TWICE A DAY 180 tablet 3  . carvedilol (COREG) 6.25 MG tablet Take 1 tablet (6.25 mg total) by mouth 2 (two) times daily with a meal. 90 tablet 3  . Cholecalciferol (VITAMIN D) 50 MCG (2000 UT) tablet Take 2,000 Units by mouth daily.    . Coenzyme Q10 200 MG TABS Take 1 tablet by mouth daily.    . Cranberry 200 MG CAPS Take 200 mg by mouth 2 (two) times a week. Take on Tues & Thurs    . ezetimibe (ZETIA) 10 MG tablet TAKE 1 TABLET DAILY (Patient taking differently: Take 10 mg by mouth daily. ) 90 tablet 4  . furosemide (LASIX) 40 MG tablet Take 40 mg by mouth daily.    . insulin regular human CONCENTRATED (HUMULIN R) 500 UNIT/ML injection Inject up to 200 units a day as advised (Patient taking differently: Inject 100-140 Units into the skin 2 (two) times daily with a meal. Take 140 units at breakfast and Take 100 units at  lunch.) 3 vial 3  . metFORMIN (GLUCOPHAGE-XR) 500 MG 24 hr tablet Take 1000 mg with lunch 180 tablet 3  . Multiple Vitamins-Minerals (CENTRUM SILVER PO) Take 1 tablet by mouth daily.    . nitroGLYCERIN (NITROSTAT) 0.4 MG SL tablet Place 1 tablet (0.4 mg total) under the tongue every 5 (five) minutes as needed for chest pain. 75 tablet 3  . NON FORMULARY Take 1 tablet by mouth daily. Methyfolate 2000 MCG daily     . omega-3 acid ethyl esters (LOVAZA) 1 g capsule TAKE 2 CAPSULES TWICE A DAY (Patient taking differently: Take 2 g by mouth 2 (two) times daily. TAKE 2 CAPSULES TWICE A DAY) 360 capsule 1  . Peppermint Oil (IBGARD) 90 MG CPCR Take 90 mg by mouth 2 (two) times daily.    . potassium chloride (K-DUR) 10 MEQ tablet Take 10 mEq by mouth 2 (two) times daily. Take two tablets in the morning and two tablets in the evening.    . pravastatin (PRAVACHOL) 80 MG tablet TAKE 1 TABLET DAILY (Patient taking differently: Take 80 mg by mouth daily. ) 90 tablet 3  . Probiotic Product (PROBIOTIC DAILY PO) Take 1 tablet by mouth daily.    Marland Kitchen  spironolactone (ALDACTONE) 25 MG tablet Take 25 mg by mouth daily.    . TRICOR 48 MG tablet TAKE 1 TABLET DAILY (Patient taking differently: Take 48 mg by mouth daily. ) 90 tablet 4  . XARELTO 20 MG TABS tablet TAKE 1 TABLET DAILY WITH SUPPER (Patient taking differently: Take 20 mg by mouth every evening. ) 90 tablet 3  . methocarbamol (ROBAXIN) 750 MG tablet Take 1 tablet by mouth daily.    Marland Kitchen sulfamethoxazole-trimethoprim (BACTRIM DS,SEPTRA DS) 800-160 MG tablet Take 1 tablet by mouth 2 (two) times daily. for 7 days     No current facility-administered medications for this visit.      Past Medical History:  Diagnosis Date  . Arthritis   . Atrial fibrillation ([place])    takes Xarelto daily  . CAD (coronary artery disease)   . Cardiomyopathy, ischemic 11/07/2013  . Cholelithiasis   . Chronic back pain    scoliosis/arthritis  . Complication of anesthesia     forgetful for about 2-3wks after anesthesia  . Congestive heart failure ([place])    takes Spironolactone daily  . Diabetes mellitus ([place])    takes Invokamet daily as well as Hum U  . History of colon polyps   . History of kidney stones   . Hyperlipidemia    takes [employer] daily  . Hyperlipidemia 11/07/2013  . Hypertension    takes Benicar and Coreg daily  . ICD (implantable cardioverter-defibrillator) battery depletion   . ICD (implantable cardioverter-defibrillator) in place 11/07/2013  . Iron deficiency anemia due to chronic blood loss 08/24/2018  . Iron malabsorption 08/24/2018  . Ischemic cardiomyopathy   . Ischemic cardiomyopathy   . Joint pain   . Myocardial infarction ([place]) 1997   on the OR table   . Nephrolithiasis   . Obstructive sleep apnea   . Peripheral neuropathy   . Pernicious anemia 08/24/2018  . Pneumonia    as a child  . Spinal stenosis   . Thrombocytopenia ([place])   . Total knee replacement status 11/29/2014  . Type 2 diabetes mellitus with stage 3 chronic kidney disease ([place]) 05/19/2015  . Urinary frequency   . Urinary urgency     ROS:   All systems reviewed and negative except as noted in the HPI.   Past Surgical History:  Procedure Laterality Date  . CARDIAC CATHETERIZATION N/A 10/02/2015   Procedure: Left Heart Cath and Cors/Grafts Angiography;  Surgeon: Belva Crome, MD;  Location: [place] [facility];  Service: Cardiovascular;  Laterality: N/A;  . COLONOSCOPY    . coronary artery bypass and graft  1997   x 5  . ESOPHAGOGASTRODUODENOSCOPY    . ICD placed    . LAMINECTOMY    . LITHOTRIPSY    . Right rotator cuff surgery    . TONSILLECTOMY     adenoids  . TOTAL KNEE ARTHROPLASTY Left   . TOTAL KNEE ARTHROPLASTY Right 11/29/2014   Procedure: RIGHT TOTAL KNEE ARTHROPLASTY;  Surgeon: Leandrew Koyanagi, MD;  Location: [place];  Service: Orthopedics;  Laterality: Right;     Family History  Problem Relation Age of Onset  . Heart disease  Other        No family history     Social History   Socioeconomic History  . Marital status: Married    Spouse name: Not on file  . Number of children: Not on file  . Years of education: Not on file  . Highest education level: Not on file  Occupational History    Comment: Retired from [employer]  . Financial resource strain: Not on file  . Food insecurity:    Worry: Not on file    Inability: Not on file  . Transportation needs:    Medical: Not on file    Non-medical: Not on file  Tobacco Use  . Smoking status: Never Smoker  . Smokeless tobacco: Never Used  Substance and Sexual Activity  . Alcohol use: Yes    Alcohol/week: 0.0 standard drinks    Comment: once drink a month  . Drug use: No  . Sexual activity: Not on file  Lifestyle  . Physical activity:    Days per week: Not on file    Minutes per session: Not on file  . Stress: Not on file  Relationships  . Social connections:    Talks on phone: Not on file    Gets together: Not on file    Attends religious service: Not on file    Active member of club or organization: Not on file    Attends meetings of clubs or organizations: Not on file    Relationship status: Not on file  . Intimate partner violence:    Fear of current or ex partner: Not on file    Emotionally abused: Not on file    Physically abused: Not on file    Forced sexual activity: Not on file  Other Topics Concern  . Not on file  Social History Narrative  . Not on file     BP 122/64   Pulse 80   Ht 6' 1"  (1.854 m)   Wt (!) 314 lb 6.4 oz (142.6 kg)   SpO2 94%   BMI 41.48 kg/m   Physical Exam:  Obese appearing NAD HEENT: Unremarkable Neck:  7 cm JVD, no thyromegally Lymphatics:  No adenopathy Back:  No CVA tenderness Lungs:  Clear with no wheezes HEART:  Regular rate rhythm, no murmurs, no rubs, no clicks Abd:  soft, positive bowel sounds, no organomegally, no rebound, no guarding Ext:  2 plus pulses, no edema, no  cyanosis, no clubbing Skin:  No rashes no nodules Neuro:  CN II through XII intact, motor grossly intact  DEVICE  Normal device function.  See PaceArt for details.   Assess/Plan: 1. Chronic systolic heart failure - his symptoms are class 2. He has some peripheral edema. He is encouraged to avoid salty foods. He will continue his current meds. 2. Obesity - he is encouraged to lose weight. 3. ICD - his [employer] [name] DDD ICD is working normally. We added a monitor only zone.   Mikle Bosworth.D.

## 2018-11-23 NOTE — Patient Instructions (Signed)
Medication Instructions:  Your physician recommends that you continue on your current medications as directed. Please refer to the Current Medication list given to you today.  Labwork: None ordered.  Testing/Procedures: None ordered.  Follow-Up: Your physician wants you to follow-up in: one year with [name].   You will receive a reminder letter in the mail two months in advance. If you don't receive a letter, please call our office to schedule the follow-up appointment.  Remote monitoring is used to monitor your ICD from home. This monitoring reduces the number of office visits required to check your device to one time per year. It allows [place] to keep an eye on the functioning of your device to ensure it is working properly. You are scheduled for a device check from home on 02/22/2019. You may send your transmission at any time that day. If you have a wireless device, the transmission will be sent automatically. After your physician reviews your transmission, you will receive a postcard with your next transmission date.  Any Other Special Instructions Will Be Listed Below (If Applicable).  If you need a refill on your cardiac medications before your next appointment, please call your pharmacy.

## 2018-11-24 DIAGNOSIS — N183 Chronic kidney disease, stage 3 (moderate): Secondary | ICD-10-CM | POA: Diagnosis not present

## 2018-11-24 DIAGNOSIS — E7800 Pure hypercholesterolemia, unspecified: Secondary | ICD-10-CM | POA: Diagnosis not present

## 2018-11-24 DIAGNOSIS — K7460 Unspecified cirrhosis of liver: Secondary | ICD-10-CM | POA: Diagnosis not present

## 2018-11-24 DIAGNOSIS — I252 Old myocardial infarction: Secondary | ICD-10-CM | POA: Diagnosis not present

## 2018-11-24 DIAGNOSIS — Z7901 Long term (current) use of anticoagulants: Secondary | ICD-10-CM | POA: Diagnosis not present

## 2018-11-24 DIAGNOSIS — G4733 Obstructive sleep apnea (adult) (pediatric): Secondary | ICD-10-CM | POA: Diagnosis not present

## 2018-11-24 DIAGNOSIS — I4891 Unspecified atrial fibrillation: Secondary | ICD-10-CM | POA: Diagnosis not present

## 2018-11-24 DIAGNOSIS — Z95810 Presence of automatic (implantable) cardiac defibrillator: Secondary | ICD-10-CM | POA: Diagnosis not present

## 2018-11-24 DIAGNOSIS — M419 Scoliosis, unspecified: Secondary | ICD-10-CM | POA: Diagnosis not present

## 2018-11-24 DIAGNOSIS — I255 Ischemic cardiomyopathy: Secondary | ICD-10-CM | POA: Diagnosis not present

## 2018-11-24 DIAGNOSIS — C220 Liver cell carcinoma: Secondary | ICD-10-CM | POA: Diagnosis not present

## 2018-11-24 DIAGNOSIS — I130 Hypertensive heart and chronic kidney disease with heart failure and stage 1 through stage 4 chronic kidney disease, or unspecified chronic kidney disease: Secondary | ICD-10-CM | POA: Diagnosis not present

## 2018-11-24 DIAGNOSIS — L03115 Cellulitis of right lower limb: Secondary | ICD-10-CM | POA: Diagnosis not present

## 2018-11-24 DIAGNOSIS — I5022 Chronic systolic (congestive) heart failure: Secondary | ICD-10-CM | POA: Diagnosis not present

## 2018-11-24 DIAGNOSIS — I2510 Atherosclerotic heart disease of native coronary artery without angina pectoris: Secondary | ICD-10-CM | POA: Diagnosis not present

## 2018-11-24 DIAGNOSIS — D509 Iron deficiency anemia, unspecified: Secondary | ICD-10-CM | POA: Diagnosis not present

## 2018-11-24 DIAGNOSIS — Z951 Presence of aortocoronary bypass graft: Secondary | ICD-10-CM | POA: Diagnosis not present

## 2018-11-24 DIAGNOSIS — Z794 Long term (current) use of insulin: Secondary | ICD-10-CM | POA: Diagnosis not present

## 2018-11-24 DIAGNOSIS — M62830 Muscle spasm of back: Secondary | ICD-10-CM | POA: Diagnosis not present

## 2018-11-24 DIAGNOSIS — E785 Hyperlipidemia, unspecified: Secondary | ICD-10-CM | POA: Diagnosis not present

## 2018-11-24 DIAGNOSIS — M1990 Unspecified osteoarthritis, unspecified site: Secondary | ICD-10-CM | POA: Diagnosis not present

## 2018-11-24 DIAGNOSIS — E559 Vitamin D deficiency, unspecified: Secondary | ICD-10-CM | POA: Diagnosis not present

## 2018-11-24 DIAGNOSIS — Z6841 Body Mass Index (BMI) 40.0 and over, adult: Secondary | ICD-10-CM | POA: Diagnosis not present

## 2018-11-24 DIAGNOSIS — E1122 Type 2 diabetes mellitus with diabetic chronic kidney disease: Secondary | ICD-10-CM | POA: Diagnosis not present

## 2018-11-24 LAB — CUP PACEART REMOTE DEVICE CHECK
Battery Remaining Longevity: 50 mo
Battery Remaining Percentage: 48 %
Battery Voltage: 2.92 V
Brady Statistic RV Percent Paced: 1 %
Date Time Interrogation Session: 20200304070017
HighPow Impedance: 80 Ohm
HighPow Impedance: 80 Ohm
Implantable Lead Implant Date: 20140320
Implantable Lead Implant Date: 20140320
Implantable Lead Location: 753859
Implantable Lead Location: 753860
Lead Channel Impedance Value: 380 Ohm
Lead Channel Impedance Value: 390 Ohm
Lead Channel Pacing Threshold Amplitude: 0.75 V
Lead Channel Pacing Threshold Pulse Width: 0.5 ms
Lead Channel Sensing Intrinsic Amplitude: 2.3 mV
Lead Channel Sensing Intrinsic Amplitude: 9.8 mV
Lead Channel Setting Pacing Amplitude: 2 V
Lead Channel Setting Pacing Pulse Width: 0.5 ms
Lead Channel Setting Sensing Sensitivity: 0.5 mV
MDC IDC PG IMPLANT DT: 20140320
MDC IDC PG SERIAL: 7067730

## 2018-11-27 ENCOUNTER — Other Ambulatory Visit: Payer: Self-pay | Admitting: Gastroenterology

## 2018-11-27 ENCOUNTER — Telehealth: Payer: Self-pay | Admitting: Cardiology

## 2018-11-27 DIAGNOSIS — M545 Low back pain: Secondary | ICD-10-CM | POA: Diagnosis not present

## 2018-11-27 DIAGNOSIS — K7460 Unspecified cirrhosis of liver: Secondary | ICD-10-CM

## 2018-11-27 DIAGNOSIS — M48061 Spinal stenosis, lumbar region without neurogenic claudication: Secondary | ICD-10-CM | POA: Diagnosis not present

## 2018-11-27 DIAGNOSIS — M5126 Other intervertebral disc displacement, lumbar region: Secondary | ICD-10-CM | POA: Diagnosis not present

## 2018-11-27 DIAGNOSIS — M4184 Other forms of scoliosis, thoracic region: Secondary | ICD-10-CM | POA: Diagnosis not present

## 2018-11-27 DIAGNOSIS — M419 Scoliosis, unspecified: Secondary | ICD-10-CM | POA: Diagnosis not present

## 2018-11-27 DIAGNOSIS — M546 Pain in thoracic spine: Secondary | ICD-10-CM | POA: Diagnosis not present

## 2018-11-27 DIAGNOSIS — I255 Ischemic cardiomyopathy: Secondary | ICD-10-CM

## 2018-11-27 DIAGNOSIS — M2578 Osteophyte, vertebrae: Secondary | ICD-10-CM | POA: Diagnosis not present

## 2018-11-27 NOTE — Telephone Encounter (Signed)
Spoke with pt, aware okay to follow up in 6 months per dr Stanford Breed. Lab orders mailed to the pt for potassium check in one month.

## 2018-11-27 NOTE — Telephone Encounter (Signed)
° °  Patient's spouse calling, wants to know if patient needs to keep upcoming appt, just saw Dr Lovena Le on 3/5. Has questions about checking potassium

## 2018-11-28 ENCOUNTER — Other Ambulatory Visit: Payer: Self-pay | Admitting: Hematology & Oncology

## 2018-11-28 DIAGNOSIS — I4891 Unspecified atrial fibrillation: Secondary | ICD-10-CM | POA: Diagnosis not present

## 2018-11-28 DIAGNOSIS — I5022 Chronic systolic (congestive) heart failure: Secondary | ICD-10-CM | POA: Diagnosis not present

## 2018-11-28 DIAGNOSIS — N183 Chronic kidney disease, stage 3 (moderate): Secondary | ICD-10-CM | POA: Diagnosis not present

## 2018-11-28 DIAGNOSIS — E1122 Type 2 diabetes mellitus with diabetic chronic kidney disease: Secondary | ICD-10-CM | POA: Diagnosis not present

## 2018-11-28 DIAGNOSIS — M545 Low back pain: Secondary | ICD-10-CM

## 2018-11-28 DIAGNOSIS — M546 Pain in thoracic spine: Principal | ICD-10-CM

## 2018-11-28 DIAGNOSIS — L03115 Cellulitis of right lower limb: Secondary | ICD-10-CM | POA: Diagnosis not present

## 2018-11-28 DIAGNOSIS — I130 Hypertensive heart and chronic kidney disease with heart failure and stage 1 through stage 4 chronic kidney disease, or unspecified chronic kidney disease: Secondary | ICD-10-CM | POA: Diagnosis not present

## 2018-11-29 ENCOUNTER — Ambulatory Visit: Payer: Medicare Other | Admitting: Cardiology

## 2018-11-30 ENCOUNTER — Encounter: Payer: Self-pay | Admitting: Cardiology

## 2018-11-30 NOTE — Progress Notes (Signed)
Remote ICD transmission.   

## 2018-12-01 ENCOUNTER — Ambulatory Visit (HOSPITAL_COMMUNITY)
Admission: RE | Admit: 2018-12-01 | Discharge: 2018-12-01 | Disposition: A | Discharge status: Home / Self Care | Payer: Medicare Other | Source: Ambulatory Visit | Attending: Gastroenterology | Admitting: Gastroenterology

## 2018-12-01 ENCOUNTER — Other Ambulatory Visit: Payer: Self-pay

## 2018-12-01 DIAGNOSIS — L03115 Cellulitis of right lower limb: Secondary | ICD-10-CM | POA: Diagnosis not present

## 2018-12-01 DIAGNOSIS — A4181 Sepsis due to Enterococcus: Secondary | ICD-10-CM | POA: Diagnosis not present

## 2018-12-01 DIAGNOSIS — I517 Cardiomegaly: Secondary | ICD-10-CM | POA: Diagnosis not present

## 2018-12-01 DIAGNOSIS — K7460 Unspecified cirrhosis of liver: Secondary | ICD-10-CM

## 2018-12-01 DIAGNOSIS — C7951 Secondary malignant neoplasm of bone: Secondary | ICD-10-CM | POA: Diagnosis not present

## 2018-12-01 DIAGNOSIS — E1122 Type 2 diabetes mellitus with diabetic chronic kidney disease: Secondary | ICD-10-CM | POA: Diagnosis not present

## 2018-12-01 DIAGNOSIS — I4891 Unspecified atrial fibrillation: Secondary | ICD-10-CM | POA: Diagnosis not present

## 2018-12-01 DIAGNOSIS — R6521 Severe sepsis with septic shock: Secondary | ICD-10-CM | POA: Diagnosis not present

## 2018-12-01 DIAGNOSIS — I442 Atrioventricular block, complete: Secondary | ICD-10-CM | POA: Diagnosis not present

## 2018-12-01 DIAGNOSIS — I130 Hypertensive heart and chronic kidney disease with heart failure and stage 1 through stage 4 chronic kidney disease, or unspecified chronic kidney disease: Secondary | ICD-10-CM | POA: Diagnosis not present

## 2018-12-01 DIAGNOSIS — N179 Acute kidney failure, unspecified: Secondary | ICD-10-CM | POA: Diagnosis not present

## 2018-12-01 DIAGNOSIS — I5022 Chronic systolic (congestive) heart failure: Secondary | ICD-10-CM | POA: Diagnosis not present

## 2018-12-01 DIAGNOSIS — C220 Liver cell carcinoma: Secondary | ICD-10-CM | POA: Diagnosis not present

## 2018-12-01 DIAGNOSIS — N183 Chronic kidney disease, stage 3 (moderate): Secondary | ICD-10-CM | POA: Diagnosis not present

## 2018-12-01 DIAGNOSIS — I5043 Acute on chronic combined systolic (congestive) and diastolic (congestive) heart failure: Secondary | ICD-10-CM | POA: Diagnosis not present

## 2018-12-01 DIAGNOSIS — G92 Toxic encephalopathy: Secondary | ICD-10-CM | POA: Diagnosis not present

## 2018-12-01 DIAGNOSIS — E875 Hyperkalemia: Secondary | ICD-10-CM | POA: Diagnosis not present

## 2018-12-01 MED ORDER — IOHEXOL 300 MG/ML  SOLN
100.0000 mL | Freq: Once | INTRAMUSCULAR | Status: AC | PRN
  Administered 2018-12-01: 100 mL via INTRAVENOUS

## 2018-12-03 ENCOUNTER — Emergency Department (HOSPITAL_COMMUNITY): Payer: Medicare Other

## 2018-12-03 ENCOUNTER — Other Ambulatory Visit: Payer: Self-pay

## 2018-12-03 ENCOUNTER — Inpatient Hospital Stay (HOSPITAL_COMMUNITY)
Admission: EM | Admit: 2018-12-03 | Discharge: 2018-12-28 | DRG: 981 | Disposition: A | Discharge status: Home Health | Payer: Medicare Other | Attending: Family Medicine | Admitting: Family Medicine

## 2018-12-03 ENCOUNTER — Encounter (HOSPITAL_COMMUNITY): Payer: Self-pay | Admitting: Emergency Medicine

## 2018-12-03 DIAGNOSIS — M62830 Muscle spasm of back: Secondary | ICD-10-CM | POA: Diagnosis not present

## 2018-12-03 DIAGNOSIS — Z91048 Other nonmedicinal substance allergy status: Secondary | ICD-10-CM

## 2018-12-03 DIAGNOSIS — T50905A Adverse effect of unspecified drugs, medicaments and biological substances, initial encounter: Secondary | ICD-10-CM | POA: Diagnosis not present

## 2018-12-03 DIAGNOSIS — D510 Vitamin B12 deficiency anemia due to intrinsic factor deficiency: Secondary | ICD-10-CM | POA: Diagnosis not present

## 2018-12-03 DIAGNOSIS — Z743 Need for continuous supervision: Secondary | ICD-10-CM | POA: Diagnosis not present

## 2018-12-03 DIAGNOSIS — R55 Syncope and collapse: Secondary | ICD-10-CM | POA: Diagnosis not present

## 2018-12-03 DIAGNOSIS — L03116 Cellulitis of left lower limb: Secondary | ICD-10-CM | POA: Diagnosis present

## 2018-12-03 DIAGNOSIS — Z888 Allergy status to other drugs, medicaments and biological substances status: Secondary | ICD-10-CM | POA: Diagnosis not present

## 2018-12-03 DIAGNOSIS — E1122 Type 2 diabetes mellitus with diabetic chronic kidney disease: Secondary | ICD-10-CM

## 2018-12-03 DIAGNOSIS — C220 Liver cell carcinoma: Secondary | ICD-10-CM | POA: Diagnosis present

## 2018-12-03 DIAGNOSIS — R7881 Bacteremia: Secondary | ICD-10-CM | POA: Diagnosis not present

## 2018-12-03 DIAGNOSIS — I2510 Atherosclerotic heart disease of native coronary artery without angina pectoris: Secondary | ICD-10-CM | POA: Diagnosis present

## 2018-12-03 DIAGNOSIS — E860 Dehydration: Secondary | ICD-10-CM | POA: Diagnosis present

## 2018-12-03 DIAGNOSIS — Z95810 Presence of automatic (implantable) cardiac defibrillator: Secondary | ICD-10-CM | POA: Diagnosis not present

## 2018-12-03 DIAGNOSIS — R42 Dizziness and giddiness: Secondary | ICD-10-CM | POA: Diagnosis not present

## 2018-12-03 DIAGNOSIS — Z7901 Long term (current) use of anticoagulants: Secondary | ICD-10-CM

## 2018-12-03 DIAGNOSIS — C7951 Secondary malignant neoplasm of bone: Secondary | ICD-10-CM

## 2018-12-03 DIAGNOSIS — R0602 Shortness of breath: Secondary | ICD-10-CM | POA: Diagnosis not present

## 2018-12-03 DIAGNOSIS — R188 Other ascites: Secondary | ICD-10-CM | POA: Diagnosis not present

## 2018-12-03 DIAGNOSIS — E785 Hyperlipidemia, unspecified: Secondary | ICD-10-CM | POA: Diagnosis present

## 2018-12-03 DIAGNOSIS — A4181 Sepsis due to Enterococcus: Secondary | ICD-10-CM | POA: Diagnosis not present

## 2018-12-03 DIAGNOSIS — I872 Venous insufficiency (chronic) (peripheral): Secondary | ICD-10-CM | POA: Diagnosis not present

## 2018-12-03 DIAGNOSIS — R140 Abdominal distension (gaseous): Secondary | ICD-10-CM

## 2018-12-03 DIAGNOSIS — K7581 Nonalcoholic steatohepatitis (NASH): Secondary | ICD-10-CM | POA: Diagnosis not present

## 2018-12-03 DIAGNOSIS — I442 Atrioventricular block, complete: Secondary | ICD-10-CM | POA: Diagnosis not present

## 2018-12-03 DIAGNOSIS — E1151 Type 2 diabetes mellitus with diabetic peripheral angiopathy without gangrene: Secondary | ICD-10-CM | POA: Diagnosis not present

## 2018-12-03 DIAGNOSIS — M5489 Other dorsalgia: Secondary | ICD-10-CM | POA: Diagnosis not present

## 2018-12-03 DIAGNOSIS — K760 Fatty (change of) liver, not elsewhere classified: Secondary | ICD-10-CM | POA: Diagnosis not present

## 2018-12-03 DIAGNOSIS — M8458XA Pathological fracture in neoplastic disease, other specified site, initial encounter for fracture: Secondary | ICD-10-CM | POA: Diagnosis not present

## 2018-12-03 DIAGNOSIS — I959 Hypotension, unspecified: Secondary | ICD-10-CM | POA: Diagnosis not present

## 2018-12-03 DIAGNOSIS — I480 Paroxysmal atrial fibrillation: Secondary | ICD-10-CM | POA: Diagnosis present

## 2018-12-03 DIAGNOSIS — K7460 Unspecified cirrhosis of liver: Secondary | ICD-10-CM | POA: Diagnosis present

## 2018-12-03 DIAGNOSIS — Z96653 Presence of artificial knee joint, bilateral: Secondary | ICD-10-CM | POA: Diagnosis present

## 2018-12-03 DIAGNOSIS — E119 Type 2 diabetes mellitus without complications: Secondary | ICD-10-CM | POA: Diagnosis not present

## 2018-12-03 DIAGNOSIS — Z794 Long term (current) use of insulin: Secondary | ICD-10-CM | POA: Diagnosis not present

## 2018-12-03 DIAGNOSIS — I129 Hypertensive chronic kidney disease with stage 1 through stage 4 chronic kidney disease, or unspecified chronic kidney disease: Secondary | ICD-10-CM | POA: Diagnosis present

## 2018-12-03 DIAGNOSIS — C229 Malignant neoplasm of liver, not specified as primary or secondary: Secondary | ICD-10-CM

## 2018-12-03 DIAGNOSIS — R1310 Dysphagia, unspecified: Secondary | ICD-10-CM | POA: Diagnosis not present

## 2018-12-03 DIAGNOSIS — K5900 Constipation, unspecified: Secondary | ICD-10-CM

## 2018-12-03 DIAGNOSIS — I330 Acute and subacute infective endocarditis: Secondary | ICD-10-CM | POA: Diagnosis not present

## 2018-12-03 DIAGNOSIS — R0902 Hypoxemia: Secondary | ICD-10-CM | POA: Diagnosis not present

## 2018-12-03 DIAGNOSIS — N183 Chronic kidney disease, stage 3 unspecified: Secondary | ICD-10-CM | POA: Diagnosis present

## 2018-12-03 DIAGNOSIS — G92 Toxic encephalopathy: Secondary | ICD-10-CM | POA: Diagnosis present

## 2018-12-03 DIAGNOSIS — Z6841 Body Mass Index (BMI) 40.0 and over, adult: Secondary | ICD-10-CM

## 2018-12-03 DIAGNOSIS — G8929 Other chronic pain: Secondary | ICD-10-CM

## 2018-12-03 DIAGNOSIS — R4702 Dysphasia: Secondary | ICD-10-CM | POA: Diagnosis present

## 2018-12-03 DIAGNOSIS — K224 Dyskinesia of esophagus: Secondary | ICD-10-CM | POA: Diagnosis not present

## 2018-12-03 DIAGNOSIS — R6521 Severe sepsis with septic shock: Secondary | ICD-10-CM | POA: Diagnosis not present

## 2018-12-03 DIAGNOSIS — R509 Fever, unspecified: Secondary | ICD-10-CM | POA: Diagnosis not present

## 2018-12-03 DIAGNOSIS — I255 Ischemic cardiomyopathy: Secondary | ICD-10-CM | POA: Diagnosis present

## 2018-12-03 DIAGNOSIS — I4891 Unspecified atrial fibrillation: Secondary | ICD-10-CM | POA: Diagnosis not present

## 2018-12-03 DIAGNOSIS — I252 Old myocardial infarction: Secondary | ICD-10-CM | POA: Diagnosis not present

## 2018-12-03 DIAGNOSIS — R945 Abnormal results of liver function studies: Secondary | ICD-10-CM

## 2018-12-03 DIAGNOSIS — E875 Hyperkalemia: Secondary | ICD-10-CM

## 2018-12-03 DIAGNOSIS — I25810 Atherosclerosis of coronary artery bypass graft(s) without angina pectoris: Secondary | ICD-10-CM | POA: Diagnosis present

## 2018-12-03 DIAGNOSIS — Z91041 Radiographic dye allergy status: Secondary | ICD-10-CM

## 2018-12-03 DIAGNOSIS — R161 Splenomegaly, not elsewhere classified: Secondary | ICD-10-CM | POA: Diagnosis present

## 2018-12-03 DIAGNOSIS — Z66 Do not resuscitate: Secondary | ICD-10-CM | POA: Diagnosis present

## 2018-12-03 DIAGNOSIS — I130 Hypertensive heart and chronic kidney disease with heart failure and stage 1 through stage 4 chronic kidney disease, or unspecified chronic kidney disease: Secondary | ICD-10-CM | POA: Diagnosis present

## 2018-12-03 DIAGNOSIS — E559 Vitamin D deficiency, unspecified: Secondary | ICD-10-CM | POA: Diagnosis not present

## 2018-12-03 DIAGNOSIS — I10 Essential (primary) hypertension: Secondary | ICD-10-CM | POA: Diagnosis not present

## 2018-12-03 DIAGNOSIS — Z8679 Personal history of other diseases of the circulatory system: Secondary | ICD-10-CM | POA: Diagnosis not present

## 2018-12-03 DIAGNOSIS — D509 Iron deficiency anemia, unspecified: Secondary | ICD-10-CM | POA: Diagnosis not present

## 2018-12-03 DIAGNOSIS — K8020 Calculus of gallbladder without cholecystitis without obstruction: Secondary | ICD-10-CM | POA: Diagnosis not present

## 2018-12-03 DIAGNOSIS — Z452 Encounter for adjustment and management of vascular access device: Secondary | ICD-10-CM | POA: Diagnosis not present

## 2018-12-03 DIAGNOSIS — G893 Neoplasm related pain (acute) (chronic): Secondary | ICD-10-CM

## 2018-12-03 DIAGNOSIS — R7989 Other specified abnormal findings of blood chemistry: Secondary | ICD-10-CM

## 2018-12-03 DIAGNOSIS — R34 Anuria and oliguria: Secondary | ICD-10-CM | POA: Diagnosis not present

## 2018-12-03 DIAGNOSIS — E1142 Type 2 diabetes mellitus with diabetic polyneuropathy: Secondary | ICD-10-CM | POA: Diagnosis present

## 2018-12-03 DIAGNOSIS — R410 Disorientation, unspecified: Secondary | ICD-10-CM | POA: Diagnosis not present

## 2018-12-03 DIAGNOSIS — I340 Nonrheumatic mitral (valve) insufficiency: Secondary | ICD-10-CM | POA: Diagnosis not present

## 2018-12-03 DIAGNOSIS — M419 Scoliosis, unspecified: Secondary | ICD-10-CM | POA: Diagnosis not present

## 2018-12-03 DIAGNOSIS — L03115 Cellulitis of right lower limb: Secondary | ICD-10-CM | POA: Diagnosis not present

## 2018-12-03 DIAGNOSIS — R772 Abnormality of alphafetoprotein: Secondary | ICD-10-CM | POA: Diagnosis present

## 2018-12-03 DIAGNOSIS — T827XXA Infection and inflammatory reaction due to other cardiac and vascular devices, implants and grafts, initial encounter: Secondary | ICD-10-CM | POA: Diagnosis not present

## 2018-12-03 DIAGNOSIS — Z950 Presence of cardiac pacemaker: Secondary | ICD-10-CM | POA: Diagnosis not present

## 2018-12-03 DIAGNOSIS — D649 Anemia, unspecified: Secondary | ICD-10-CM | POA: Diagnosis not present

## 2018-12-03 DIAGNOSIS — G4733 Obstructive sleep apnea (adult) (pediatric): Secondary | ICD-10-CM | POA: Diagnosis present

## 2018-12-03 DIAGNOSIS — S22069A Unspecified fracture of T7-T8 vertebra, initial encounter for closed fracture: Secondary | ICD-10-CM | POA: Diagnosis not present

## 2018-12-03 DIAGNOSIS — M8448XA Pathological fracture, other site, initial encounter for fracture: Secondary | ICD-10-CM | POA: Diagnosis present

## 2018-12-03 DIAGNOSIS — D696 Thrombocytopenia, unspecified: Secondary | ICD-10-CM | POA: Diagnosis not present

## 2018-12-03 DIAGNOSIS — I5022 Chronic systolic (congestive) heart failure: Secondary | ICD-10-CM | POA: Diagnosis not present

## 2018-12-03 DIAGNOSIS — M546 Pain in thoracic spine: Secondary | ICD-10-CM | POA: Diagnosis not present

## 2018-12-03 DIAGNOSIS — N179 Acute kidney failure, unspecified: Secondary | ICD-10-CM

## 2018-12-03 DIAGNOSIS — N189 Chronic kidney disease, unspecified: Secondary | ICD-10-CM | POA: Diagnosis not present

## 2018-12-03 DIAGNOSIS — E278 Other specified disorders of adrenal gland: Secondary | ICD-10-CM | POA: Diagnosis present

## 2018-12-03 DIAGNOSIS — M549 Dorsalgia, unspecified: Secondary | ICD-10-CM

## 2018-12-03 DIAGNOSIS — E7800 Pure hypercholesterolemia, unspecified: Secondary | ICD-10-CM | POA: Diagnosis not present

## 2018-12-03 DIAGNOSIS — E1165 Type 2 diabetes mellitus with hyperglycemia: Secondary | ICD-10-CM | POA: Diagnosis not present

## 2018-12-03 DIAGNOSIS — Z95828 Presence of other vascular implants and grafts: Secondary | ICD-10-CM

## 2018-12-03 DIAGNOSIS — W19XXXA Unspecified fall, initial encounter: Secondary | ICD-10-CM | POA: Diagnosis not present

## 2018-12-03 DIAGNOSIS — I5043 Acute on chronic combined systolic (congestive) and diastolic (congestive) heart failure: Secondary | ICD-10-CM | POA: Diagnosis present

## 2018-12-03 DIAGNOSIS — Z79899 Other long term (current) drug therapy: Secondary | ICD-10-CM

## 2018-12-03 DIAGNOSIS — D631 Anemia in chronic kidney disease: Secondary | ICD-10-CM | POA: Diagnosis present

## 2018-12-03 DIAGNOSIS — R001 Bradycardia, unspecified: Secondary | ICD-10-CM | POA: Diagnosis present

## 2018-12-03 DIAGNOSIS — M1990 Unspecified osteoarthritis, unspecified site: Secondary | ICD-10-CM | POA: Diagnosis not present

## 2018-12-03 DIAGNOSIS — E669 Obesity, unspecified: Secondary | ICD-10-CM | POA: Diagnosis not present

## 2018-12-03 DIAGNOSIS — S22060A Wedge compression fracture of T7-T8 vertebra, initial encounter for closed fracture: Secondary | ICD-10-CM | POA: Diagnosis not present

## 2018-12-03 DIAGNOSIS — E11649 Type 2 diabetes mellitus with hypoglycemia without coma: Secondary | ICD-10-CM | POA: Diagnosis not present

## 2018-12-03 DIAGNOSIS — S22068A Other fracture of T7-T8 thoracic vertebra, initial encounter for closed fracture: Secondary | ICD-10-CM | POA: Diagnosis not present

## 2018-12-03 DIAGNOSIS — R5381 Other malaise: Secondary | ICD-10-CM | POA: Diagnosis not present

## 2018-12-03 DIAGNOSIS — R079 Chest pain, unspecified: Secondary | ICD-10-CM | POA: Diagnosis not present

## 2018-12-03 DIAGNOSIS — C228 Malignant neoplasm of liver, primary, unspecified as to type: Secondary | ICD-10-CM | POA: Diagnosis not present

## 2018-12-03 DIAGNOSIS — Z8719 Personal history of other diseases of the digestive system: Secondary | ICD-10-CM

## 2018-12-03 DIAGNOSIS — B952 Enterococcus as the cause of diseases classified elsewhere: Secondary | ICD-10-CM | POA: Diagnosis not present

## 2018-12-03 DIAGNOSIS — I517 Cardiomegaly: Secondary | ICD-10-CM | POA: Diagnosis not present

## 2018-12-03 DIAGNOSIS — R279 Unspecified lack of coordination: Secondary | ICD-10-CM | POA: Diagnosis not present

## 2018-12-03 DIAGNOSIS — Z951 Presence of aortocoronary bypass graft: Secondary | ICD-10-CM | POA: Diagnosis not present

## 2018-12-03 HISTORY — DX: Cellulitis, unspecified: L03.90

## 2018-12-03 HISTORY — DX: Liver cell carcinoma: C22.0

## 2018-12-03 LAB — COMPREHENSIVE METABOLIC PANEL
ALBUMIN: 3.1 g/dL — AB (ref 3.5–5.0)
ALT: 94 U/L — ABNORMAL HIGH (ref 0–44)
AST: 85 U/L — AB (ref 15–41)
Alkaline Phosphatase: 151 U/L — ABNORMAL HIGH (ref 38–126)
Anion gap: 11 (ref 5–15)
BUN: 58 mg/dL — AB (ref 8–23)
CO2: 19 mmol/L — ABNORMAL LOW (ref 22–32)
Calcium: 8.8 mg/dL — ABNORMAL LOW (ref 8.9–10.3)
Chloride: 95 mmol/L — ABNORMAL LOW (ref 98–111)
Creatinine, Ser: 2.78 mg/dL — ABNORMAL HIGH (ref 0.61–1.24)
GFR calc Af Amer: 24 mL/min — ABNORMAL LOW (ref >60)
GFR calc non Af Amer: 21 mL/min — ABNORMAL LOW (ref >60)
Glucose, Bld: 342 mg/dL — ABNORMAL HIGH (ref 70–99)
Potassium: 6.2 mmol/L — ABNORMAL HIGH (ref 3.5–5.1)
Sodium: 125 mmol/L — ABNORMAL LOW (ref 135–145)
Total Bilirubin: 1 mg/dL (ref 0.3–1.2)
Total Protein: 6.5 g/dL (ref 6.5–8.1)

## 2018-12-03 LAB — CBC WITH DIFFERENTIAL/PLATELET
ABS IMMATURE GRANULOCYTES: 0.07 10*3/uL (ref 0.00–0.07)
Basophils Absolute: 0 10*3/uL (ref 0.0–0.1)
Basophils Relative: 0 %
Eosinophils Absolute: 0.1 10*3/uL (ref 0.0–0.5)
Eosinophils Relative: 1 %
HCT: 37.5 % — ABNORMAL LOW (ref 39.0–52.0)
Hemoglobin: 11.7 g/dL — ABNORMAL LOW (ref 13.0–17.0)
Immature Granulocytes: 1 %
LYMPHS ABS: 0.7 10*3/uL (ref 0.7–4.0)
Lymphocytes Relative: 8 %
MCH: 29.4 pg (ref 26.0–34.0)
MCHC: 31.2 g/dL (ref 30.0–36.0)
MCV: 94.2 fL (ref 80.0–100.0)
Monocytes Absolute: 1.2 10*3/uL — ABNORMAL HIGH (ref 0.1–1.0)
Monocytes Relative: 14 %
NEUTROS PCT: 76 %
Neutro Abs: 6.3 10*3/uL (ref 1.7–7.7)
Platelets: 115 10*3/uL — ABNORMAL LOW (ref 150–400)
RBC: 3.98 MIL/uL — ABNORMAL LOW (ref 4.22–5.81)
RDW: 18.7 % — ABNORMAL HIGH (ref 11.5–15.5)
WBC: 8.3 10*3/uL (ref 4.0–10.5)
nRBC: 0 % (ref 0.0–0.2)

## 2018-12-03 LAB — BASIC METABOLIC PANEL
Anion gap: 8 (ref 5–15)
BUN: 58 mg/dL — ABNORMAL HIGH (ref 8–23)
CO2: 21 mmol/L — ABNORMAL LOW (ref 22–32)
Calcium: 8.8 mg/dL — ABNORMAL LOW (ref 8.9–10.3)
Chloride: 96 mmol/L — ABNORMAL LOW (ref 98–111)
Creatinine, Ser: 2.6 mg/dL — ABNORMAL HIGH (ref 0.61–1.24)
GFR calc Af Amer: 26 mL/min — ABNORMAL LOW (ref >60)
GFR calc non Af Amer: 23 mL/min — ABNORMAL LOW (ref >60)
Glucose, Bld: 248 mg/dL — ABNORMAL HIGH (ref 70–99)
Potassium: 5.4 mmol/L — ABNORMAL HIGH (ref 3.5–5.1)
SODIUM: 125 mmol/L — AB (ref 135–145)

## 2018-12-03 LAB — I-STAT TROPONIN, ED: Troponin i, poc: 0.03 ng/mL (ref 0.00–0.08)

## 2018-12-03 LAB — TROPONIN I: Troponin I: 0.06 ng/mL (ref <0.03)

## 2018-12-03 LAB — LACTIC ACID, PLASMA: Lactic Acid, Venous: 1.8 mmol/L (ref 0.5–1.9)

## 2018-12-03 LAB — GLUCOSE, CAPILLARY: Glucose-Capillary: 219 mg/dL — ABNORMAL HIGH (ref 70–99)

## 2018-12-03 MED ORDER — INSULIN REGULAR HUMAN (CONC) 500 UNIT/ML ~~LOC~~ SOPN
70.0000 [IU] | PEN_INJECTOR | Freq: Once | SUBCUTANEOUS | Status: DC
  Filled 2018-12-03 (×2): qty 3

## 2018-12-03 MED ORDER — VANCOMYCIN HCL 10 G IV SOLR
2000.0000 mg | Freq: Once | INTRAVENOUS | Status: AC
  Administered 2018-12-03: 2000 mg via INTRAVENOUS
  Filled 2018-12-03: qty 2000

## 2018-12-03 MED ORDER — INSULIN ASPART 100 UNIT/ML ~~LOC~~ SOLN: 0.0000 [IU] | Freq: Three times a day (TID) | SUBCUTANEOUS | Status: DC

## 2018-12-03 MED ORDER — SODIUM CHLORIDE 0.9 % IV SOLN
INTRAVENOUS | Status: DC
  Administered 2018-12-03 – 2018-12-07 (×8): via INTRAVENOUS
  Administered 2018-12-08: 50 mL/h via INTRAVENOUS

## 2018-12-03 MED ORDER — LIDOCAINE 5 % EX PTCH
1.0000 | MEDICATED_PATCH | CUTANEOUS | Status: DC
  Administered 2018-12-03 – 2018-12-28 (×25): 1 via TRANSDERMAL
  Filled 2018-12-03 (×30): qty 1

## 2018-12-03 MED ORDER — ACETAMINOPHEN 325 MG PO TABS
650.0000 mg | ORAL_TABLET | Freq: Four times a day (QID) | ORAL | Status: DC | PRN
  Administered 2018-12-10 (×2): 650 mg via ORAL
  Filled 2018-12-03 (×2): qty 2

## 2018-12-03 MED ORDER — DOCUSATE SODIUM 100 MG PO CAPS
100.0000 mg | ORAL_CAPSULE | Freq: Two times a day (BID) | ORAL | Status: DC
  Administered 2018-12-04 – 2018-12-08 (×7): 100 mg via ORAL
  Filled 2018-12-03 (×10): qty 1

## 2018-12-03 MED ORDER — OXYCODONE-ACETAMINOPHEN 7.5-325 MG PO TABS
1.0000 | ORAL_TABLET | Freq: Three times a day (TID) | ORAL | Status: DC | PRN
  Administered 2018-12-04: 0.5 via ORAL
  Administered 2018-12-04: 1 via ORAL
  Administered 2018-12-04 – 2018-12-05 (×2): 0.5 via ORAL
  Administered 2018-12-05: 1 via ORAL
  Administered 2018-12-06: 0.5 via ORAL
  Filled 2018-12-03 (×6): qty 1

## 2018-12-03 MED ORDER — VANCOMYCIN VARIABLE DOSE PER UNSTABLE RENAL FUNCTION (PHARMACIST DOSING): Status: DC

## 2018-12-03 MED ORDER — CARVEDILOL 6.25 MG PO TABS
6.2500 mg | ORAL_TABLET | Freq: Two times a day (BID) | ORAL | Status: DC
  Administered 2018-12-03 – 2018-12-21 (×36): 6.25 mg via ORAL
  Filled 2018-12-03 (×36): qty 1

## 2018-12-03 MED ORDER — PIPERACILLIN-TAZOBACTAM 3.375 G IVPB 30 MIN: 3.3750 g | Freq: Once | INTRAVENOUS | Status: DC

## 2018-12-03 MED ORDER — SODIUM CHLORIDE 0.9 % IV BOLUS
1000.0000 mL | Freq: Once | INTRAVENOUS | Status: AC
  Administered 2018-12-03: 1000 mL via INTRAVENOUS

## 2018-12-03 MED ORDER — VANCOMYCIN HCL IN DEXTROSE 1-5 GM/200ML-% IV SOLN: 1000.0000 mg | Freq: Once | INTRAVENOUS | Status: DC

## 2018-12-03 MED ORDER — ONDANSETRON HCL 4 MG PO TABS: 4.0000 mg | ORAL_TABLET | Freq: Four times a day (QID) | ORAL | Status: DC | PRN

## 2018-12-03 MED ORDER — METHOCARBAMOL 750 MG PO TABS
375.0000 mg | ORAL_TABLET | Freq: Three times a day (TID) | ORAL | Status: DC | PRN
  Administered 2018-12-03 – 2018-12-16 (×7): 375 mg via ORAL
  Filled 2018-12-03 (×3): qty 0.5
  Filled 2018-12-03: qty 1
  Filled 2018-12-03: qty 0.5
  Filled 2018-12-03 (×4): qty 1

## 2018-12-03 MED ORDER — ACETAMINOPHEN 650 MG RE SUPP
650.0000 mg | Freq: Four times a day (QID) | RECTAL | Status: DC | PRN
  Administered 2018-12-21: 650 mg via RECTAL
  Filled 2018-12-03: qty 1

## 2018-12-03 MED ORDER — ONDANSETRON HCL 4 MG/2ML IJ SOLN: 4.0000 mg | Freq: Four times a day (QID) | INTRAMUSCULAR | Status: DC | PRN

## 2018-12-03 MED ORDER — INSULIN REGULAR HUMAN (CONC) 500 UNIT/ML ~~LOC~~ SOPN
70.0000 [IU] | PEN_INJECTOR | Freq: Every day | SUBCUTANEOUS | Status: DC
  Administered 2018-12-04: 35 [IU] via SUBCUTANEOUS
  Filled 2018-12-03: qty 3

## 2018-12-03 MED ORDER — HEPARIN (PORCINE) 25000 UT/250ML-% IV SOLN
1650.0000 [IU]/h | INTRAVENOUS | Status: DC
  Administered 2018-12-03 – 2018-12-05 (×4): 1500 [IU]/h via INTRAVENOUS
  Administered 2018-12-06 – 2018-12-07 (×2): 1600 [IU]/h via INTRAVENOUS
  Filled 2018-12-03 (×7): qty 250

## 2018-12-03 MED ORDER — INSULIN ASPART 100 UNIT/ML ~~LOC~~ SOLN
0.0000 [IU] | Freq: Every day | SUBCUTANEOUS | Status: DC
  Administered 2018-12-03: 2 [IU] via SUBCUTANEOUS

## 2018-12-03 MED ORDER — PIPERACILLIN-TAZOBACTAM 3.375 G IVPB
3.3750 g | Freq: Three times a day (TID) | INTRAVENOUS | Status: DC
  Administered 2018-12-03 – 2018-12-04 (×3): 3.375 g via INTRAVENOUS
  Filled 2018-12-03 (×3): qty 50

## 2018-12-03 MED ORDER — SODIUM CHLORIDE 0.9% FLUSH
3.0000 mL | Freq: Two times a day (BID) | INTRAVENOUS | Status: DC
  Administered 2018-12-03 – 2018-12-27 (×37): 3 mL via INTRAVENOUS

## 2018-12-03 MED ORDER — INSULIN REGULAR HUMAN (CONC) 500 UNIT/ML ~~LOC~~ SOPN
150.0000 [IU] | PEN_INJECTOR | Freq: Every day | SUBCUTANEOUS | Status: DC
  Administered 2018-12-04: 150 [IU] via SUBCUTANEOUS
  Filled 2018-12-03: qty 3

## 2018-12-03 MED ORDER — RIVAROXABAN 20 MG PO TABS: 20.0000 mg | ORAL_TABLET | Freq: Every day | ORAL | Status: DC

## 2018-12-03 NOTE — ED Notes (Addendum)
ED TO INPATIENT HANDOFF REPORT  ED Nurse Name and Phone #:  [phone]   S Name/Age/Gender Lorenza Burton 78 y.o. male Room/Bed: 026C/026C  Code Status   Code Status: Prior  Home/SNF/Other Home Patient oriented to: self, place, time and situation Is this baseline? Yes   Triage Complete: Triage complete  Chief Complaint brady; hypotensive  Triage Note Pt here via EMS with c/o dizziness and bradycardia. Pt denies CP and SHOB.  Pt A/O, mentation at baseline.  Pt has a [employer] ICD.  Pt also endorses and episode of LOC.   EMS administered 500ML of NS.     Allergies Allergies  Allergen Reactions  . Contrast Media [Iodinated Diagnostic Agents] Rash and Other (See Comments)    Extreme flushing  . Ioxaglate Other (See Comments)    Extreme flushing  . Feraheme [Ferumoxytol] Other (See Comments)    Patient had  reaction during infusion in Feb 2020 decision made to change to [facility] (passed out)  . Iodine Other (See Comments)    flushing  . Seasonal Ic [Cholestatin] Other (See Comments)    Unknown reaction  . Adhesive [Tape] Rash    Adhesive tapes causes redness/itching/rash after removal    Level of Care/Admitting Diagnosis ED Disposition    ED Disposition Condition [facility] Area: [place] [[id number]]  Level of Care: Telemetry Cardiac [103]  Diagnosis: Near syncope [[id number]]  Admitting Physician: Karmen Bongo [[id number]]  Attending Physician: Karmen Bongo [[id number]]  Estimated length of stay: 3 - 4 days  Certification:: I certify this patient will need inpatient services for at least 2 midnights  PT Class (Do Not Modify): Inpatient [101]  PT Acc Code (Do Not Modify): Private [1]       B Medical/Surgery History Past Medical History:  Diagnosis Date  . Arthritis   . Atrial fibrillation ([place])    takes Xarelto daily  . Cholelithiasis   . Chronic back pain    scoliosis/arthritis  . Complication of anesthesia    forgetful for about  2-3wks after anesthesia  . History of colon polyps   . Hyperlipidemia    takes [employer] daily  . Hypertension    takes Benicar and Coreg daily  . ICD (implantable cardioverter-defibrillator) in place 11/07/2013  . Iron deficiency anemia due to chronic blood loss 08/24/2018  . Iron malabsorption 08/24/2018  . Ischemic cardiomyopathy   . Myocardial infarction ([place]) 1997   on the OR table   . Nephrolithiasis   . Obstructive sleep apnea   . Peripheral neuropathy   . Pernicious anemia 08/24/2018  . Spinal stenosis   . Thrombocytopenia ([place])   . Total knee replacement status 11/29/2014  . Type 2 diabetes mellitus with stage 3 chronic kidney disease ([place]) 05/19/2015   Past Surgical History:  Procedure Laterality Date  . CARDIAC CATHETERIZATION N/A 10/02/2015   Procedure: Left Heart Cath and Cors/Grafts Angiography;  Surgeon: Belva Crome, MD;  Location: [place] [facility];  Service: Cardiovascular;  Laterality: N/A;  . COLONOSCOPY    . coronary artery bypass and graft  1997   x 5  . ESOPHAGOGASTRODUODENOSCOPY    . ICD placed    . LAMINECTOMY    . LITHOTRIPSY    . Right rotator cuff surgery    . TONSILLECTOMY     adenoids  . TOTAL KNEE ARTHROPLASTY Left   . TOTAL KNEE ARTHROPLASTY Right 11/29/2014   Procedure: RIGHT TOTAL KNEE ARTHROPLASTY;  Surgeon: Leandrew Koyanagi,  MD;  Location: [place];  Service: Orthopedics;  Laterality: Right;     A IV Location/Drains/Wounds Patient Lines/Drains/Airways Status   Active Line/Drains/Airways    Name:   Placement date:   Placement time:   Site:   Days:   Peripheral IV 12/03/18 Left Antecubital   12/03/18    1515    Antecubital   less than 1   Incision (Closed) 11/29/14 Knee Right   11/29/14    1434     1465          Intake/Output Last 24 hours No intake or output data in the 24 hours ending 12/03/18 1638  Labs/Imaging Results for orders placed or performed during the hospital encounter of 12/03/18 (from the past 48 hour(s))   CBC with Differential/Platelet     Status: Abnormal   Collection Time: 12/03/18  2:17 PM  Result Value Ref Range   WBC 8.3 4.0 - 10.5 K/uL   RBC 3.98 (L) 4.22 - 5.81 MIL/uL   Hemoglobin 11.7 (L) 13.0 - 17.0 g/dL   HCT 37.5 (L) 39.0 - 52.0 %   MCV 94.2 80.0 - 100.0 fL   MCH 29.4 26.0 - 34.0 pg   MCHC 31.2 30.0 - 36.0 g/dL   RDW 18.7 (H) 11.5 - 15.5 %   Platelets 115 (L) 150 - 400 K/uL    Comment: REPEATED TO VERIFY PLATELET COUNT CONFIRMED BY SMEAR Immature Platelet Fraction may be clinically indicated, consider ordering this additional test [id number]    nRBC 0.0 0.0 - 0.2 %   Neutrophils Relative % 76 %   Neutro Abs 6.3 1.7 - 7.7 K/uL   Lymphocytes Relative 8 %   Lymphs Abs 0.7 0.7 - 4.0 K/uL   Monocytes Relative 14 %   Monocytes Absolute 1.2 (H) 0.1 - 1.0 K/uL   Eosinophils Relative 1 %   Eosinophils Absolute 0.1 0.0 - 0.5 K/uL   Basophils Relative 0 %   Basophils Absolute 0.0 0.0 - 0.1 K/uL   Immature Granulocytes 1 %   Abs Immature Granulocytes 0.07 0.00 - 0.07 K/uL    Comment: Performed at [facility], [address] [place], [place] [address]  Comprehensive metabolic panel     Status: Abnormal   Collection Time: 12/03/18  2:17 PM  Result Value Ref Range   Sodium 125 (L) 135 - 145 mmol/L   Potassium 6.2 (H) 3.5 - 5.1 mmol/L   Chloride 95 (L) 98 - 111 mmol/L   CO2 19 (L) 22 - 32 mmol/L   Glucose, Bld 342 (H) 70 - 99 mg/dL   BUN 58 (H) 8 - 23 mg/dL   Creatinine, Ser 2.78 (H) 0.61 - 1.24 mg/dL   Calcium 8.8 (L) 8.9 - 10.3 mg/dL   Total Protein 6.5 6.5 - 8.1 g/dL   Albumin 3.1 (L) 3.5 - 5.0 g/dL   AST 85 (H) 15 - 41 U/L   ALT 94 (H) 0 - 44 U/L   Alkaline Phosphatase 151 (H) 38 - 126 U/L   Total Bilirubin 1.0 0.3 - 1.2 mg/dL   GFR calc non Af Amer 21 (L) >60 mL/min   GFR calc Af Amer 24 (L) >60 mL/min   Anion gap 11 5 - 15    Comment: Performed at [facility], [place] [address], [place], [place] [address]  I-stat troponin, ED     Status: None    Collection Time: 12/03/18  2:27 PM  Result Value Ref Range   Troponin i,  poc 0.03 0.00 - 0.08 ng/mL   Comment 3            Comment: Due to the release kinetics of cTnI, a negative result within the first hours of the onset of symptoms does not rule out myocardial infarction with certainty. If myocardial infarction is still suspected, repeat the test at appropriate intervals.    Ct Abdomen W Wo Contrast  Result Date: 12/01/2018 CLINICAL DATA:  Cirrhosis. Indeterminate echogenic focus in right liver on recent ultrasound. EXAM: CT ABDOMEN WITHOUT AND WITH CONTRAST TECHNIQUE: Multidetector CT imaging of the abdomen was performed following the standard protocol before and following the bolus administration of intravenous contrast. CONTRAST:  127m OMNIPAQUE IOHEXOL 300 MG/ML  SOLN COMPARISON:  05/26/2018 CT abdomen. FINDINGS: Lower chest: No significant pulmonary nodules or acute consolidative airspace disease. Punctate right lung base calcified granuloma. ICD leads are seen terminating in the right atrium and right ventricle. Coronary atherosclerosis. Newly mildly enlarged 1.1 cm right pericardiophrenic node (series 9/image 21). Hepatobiliary: Diffusely irregular liver surface, compatible with hepatic cirrhosis. There is an 8.0 x 6.0 cm right liver dome mass (series 5/image 14) with mild arterial phase hyperenhancement, no clear washout or enhancing capsule, not well visualized on the 05/26/2018 CT, favored to be increased from 5.3 x 4.5 cm, compatible with LI-RADS category 5. surrounding heterogeneous enhancement and scattered subcentimeter nodules in the superior right liver lobe, not appreciably changed. Cholelithiasis. No gallbladder wall thickening. No biliary ductal dilatation. Pancreas: Normal, with no mass or duct dilation. Spleen: Moderate splenomegaly (craniocaudal splenic length 17.0 cm). No splenic mass. Adrenals/Urinary Tract: Left adrenal 3.5 cm nodule with precontrast density 41 HU, new  since 05/26/2018 CT. No right adrenal nodule. Nonobstructing 5 mm lower left renal stones. Borderline mild bilateral hydronephrosis, new. Stomach/Bowel: Normal non-distended stomach. Visualized small and large bowel is normal caliber, with no bowel wall thickening. Vascular/Lymphatic: Atherosclerotic nonaneurysmal abdominal aorta. Patent portal, splenic, hepatic and renal veins. No pathologically enlarged lymph nodes in the abdomen. Other: No pneumoperitoneum, ascites or focal fluid collection. Musculoskeletal: No aggressive appearing focal osseous lesions. Marked thoracolumbar spondylosis. IMPRESSION: 1. Cirrhosis. Right liver dome 8.0 cm mass, LI-RADS category 5 (definitely hepatocellular carcinoma). 2. New 3.5 cm left adrenal nodule, compatible with left adrenal metastasis. 3. New right pericardiophrenic adenopathy suggesting nodal metastasis. 4. Moderate splenomegaly. 5. Borderline mild bilateral hydronephrosis is new, recommend correlation with serum creatinine. Nonobstructing left nephrolithiasis. 6. Cholelithiasis. 7.  Aortic Atherosclerosis (ICD10-I70.0). These results will be called to the ordering clinician or representative by the Radiologist Assistant, and communication documented in the PACS or zVision Dashboard. Electronically Signed   By: JIlona SorrelM.D.   On: 12/01/2018 17:55   Dg Chest Port 1 View  Result Date: 12/03/2018 CLINICAL DATA:  Near syncope. EXAM: PORTABLE CHEST 1 VIEW COMPARISON:  September 07, 2018 FINDINGS: Stable AICD device. Cardiomegaly, unchanged. The hila, mediastinum, lungs, and pleura are unremarkable. A transcutaneous pacer lead overlies the upper medial left chest. IMPRESSION: No acute interval change. Electronically Signed   By: DDorise BullionIII M.D   On: 12/03/2018 14:51    Pending Labs Unresulted Labs (From admission, onward)    Start     Ordered   12/03/18 1457  Lactic acid, plasma  Once,   STAT     12/03/18 1456          Vitals/Pain Today's Vitals    12/03/18 1545 12/03/18 1600 12/03/18 1615 12/03/18 1630  BP: (!) 95/42 (!) 105/50 (!) 100/41 (!) 103/53  Pulse:  65 63 65 63  Resp: 19 17 19 17   SpO2: 98% 100% 99% 98%  Weight:      Height:      PainSc:        Isolation Precautions No active isolations  Medications Medications  sodium chloride 0.9 % bolus 1,000 mL (1,000 mLs Intravenous New Bag/Given 12/03/18 1530)    Mobility walks with device High fall risk   Focused Assessments Cardiac Assessment Handoff:  Cardiac Rhythm: Heart block Lab Results  Component Value Date   CKTOTAL 201 11/14/2013   TROPONINI <0.03 11/14/2018   No results found for: DDIMER Does the Patient currently have chest pain? No     R Recommendations: See Admitting Provider Note  Report given to:   Additional Notes:   Patient is a 78 year old male with an extensive medical history of coronary artery disease status post CABG, atrial fibrillation on Xarelto, AICD placement, hypertension, cardiomyopathy, diabetes and a chronic unhealing wound on his right lower extremity resulting in 5 rounds of antibiotics in the last 4 months presenting today with near syncope.  Device was interrogated and reprogrammed.  Dressing removed above the right ankle patient has several bulla present some that have ruptured with a yellow fluid. No significant tenderness.  Mild erythema with mild warmth

## 2018-12-03 NOTE — ED Provider Notes (Signed)
[facility] EMERGENCY DEPARTMENT Provider Note   CSN: [id number] Arrival date & time: 12/03/18  1403    History   Chief Complaint No chief complaint on file.   HPI Robert Gregory is a 78 y.o. male.     Patient is a 78 year old male with a and extensive medical history of coronary artery disease status post CABG, atrial fibrillation on Xarelto, AICD placement, hypertension, cardiomyopathy, diabetes and a chronic unhealing wound on his right lower extremity resulting in 5 rounds of antibiotics in the last 4 months presenting today with near syncope.  Patient states for the last 3 to 4 days he has not felt great but this morning when he got up he was extremely dizzy and anytime he tries to get up or move he feels like he is going to pass out.  He also complains of feeling extremely fatigued.  He denies any cough, fever, chest pain, shortness of breath or abdominal pain.  He has not noticed any new swelling in his legs.  He was started on Bactrim 3 days ago but has had no other medication changes.  He has been taking all of his medications as prescribed.  Patient saw [name] on 3 5 and at that time had an ICD check which everything looked okay and he was placed in a monitor only zone.  Paramedics noted bradycardia upon their arrival today but patient was awake and alert.  The history is provided by the patient.    Past Medical History:  Diagnosis Date   Arthritis    Atrial fibrillation ([place])    takes Xarelto daily   CAD (coronary artery disease)    Cardiomyopathy, ischemic 11/07/2013   Cholelithiasis    Chronic back pain    scoliosis/arthritis   Complication of anesthesia    forgetful for about 2-3wks after anesthesia   Congestive heart failure ([place])    takes Spironolactone daily   Diabetes mellitus ([place])    takes Invokamet daily as well as Hum U   History of colon polyps    History of kidney stones    Hyperlipidemia    takes Zetia,Pravastatin,and  Tricor daily   Hyperlipidemia 11/07/2013   Hypertension    takes Benicar and Coreg daily   ICD (implantable cardioverter-defibrillator) battery depletion    ICD (implantable cardioverter-defibrillator) in place 11/07/2013   Iron deficiency anemia due to chronic blood loss 08/24/2018   Iron malabsorption 08/24/2018   Ischemic cardiomyopathy    Ischemic cardiomyopathy    Joint pain    Myocardial infarction ([place]) 1997   on the OR table    Nephrolithiasis    Obstructive sleep apnea    Peripheral neuropathy    Pernicious anemia 08/24/2018   Pneumonia    as a child   Spinal stenosis    Thrombocytopenia ([place])    Total knee replacement status 11/29/2014   Type 2 diabetes mellitus with stage 3 chronic kidney disease ([place]) 05/19/2015   Urinary frequency    Urinary urgency     Patient Active Problem List   Diagnosis Date Noted   Cellulitis of right lower extremity    Sepsis ([place]) 09/07/2018   Iron deficiency anemia due to chronic blood loss 08/24/2018   Iron malabsorption 08/24/2018   Pernicious anemia 08/24/2018   Obesity, Class III, BMI 40-49.9 (morbid obesity) ([place]) 05/06/2017   Splenomegaly, not elsewhere classified 10/27/2016   Renal mass, left 10/27/2016   Abnormal nuclear stress test    Obstructive sleep apnea  Nephrolithiasis    Thrombocytopenia ([facility])    Cholelithiasis    ICD (implantable cardioverter-defibrillator) battery depletion    Ischemic cardiomyopathy    Hypertension    Myocardial infarction [facility])    Pneumonia    Peripheral neuropathy    Joint pain    Chronic back pain    History of colon polyps    Urinary frequency    History of kidney stones    Type 2 diabetes mellitus with stage 3 chronic kidney disease ([place]) 05/19/2015   H/O total knee replacement, right 11/29/2014   Pre-operative cardiovascular examination 11/20/2014   CAD (coronary artery disease) 11/07/2013   Atrial fibrillation ([place]) 11/07/2013   ICD  (implantable cardioverter-defibrillator) in place 11/07/2013   Essential hypertension 11/07/2013   Hyperlipidemia 11/07/2013   Cardiomyopathy, ischemic 11/07/2013    Past Surgical History:  Procedure Laterality Date   CARDIAC CATHETERIZATION N/A 10/02/2015   Procedure: Left Heart Cath and Cors/Grafts Angiography;  Surgeon: Belva Crome, MD;  Location: [place] CV LAB;  Service: Cardiovascular;  Laterality: N/A;   COLONOSCOPY     coronary artery bypass and graft  1997   x 5   ESOPHAGOGASTRODUODENOSCOPY     ICD placed     LAMINECTOMY     LITHOTRIPSY     Right rotator cuff surgery     TONSILLECTOMY     adenoids   TOTAL KNEE ARTHROPLASTY Left    TOTAL KNEE ARTHROPLASTY Right 11/29/2014   Procedure: RIGHT TOTAL KNEE ARTHROPLASTY;  Surgeon: Leandrew Koyanagi, MD;  Location: [place];  Service: Orthopedics;  Laterality: Right;        Home Medications    Prior to Admission medications   Medication Sig Start Date End Date Taking? Authorizing Provider  amoxicillin (AMOXIL) 500 MG capsule Take 500 mg by mouth as directed. 6 prior to dental work    [provider]  BD INSULIN SYRINGE U-500 31G X 6MM 0.5 ML MISC USE 1 SYRINGE TO INJECT UNDER THE SKIN THREE TIMES A DAY BEFORE MEALS 11/11/17   Philemon Kingdom, MD  BENICAR 40 MG tablet TAKE 1 TABLET TWICE A DAY 03/13/18   Lelon Perla, MD  carvedilol (COREG) 6.25 MG tablet Take 1 tablet (6.25 mg total) by mouth 2 (two) times daily with a meal. 05/31/18   Lelon Perla, MD  Cholecalciferol (VITAMIN D) 50 MCG (2000 UT) tablet Take 2,000 Units by mouth daily.    [provider]  Coenzyme Q10 200 MG TABS Take 1 tablet by mouth daily.    [provider]  Cranberry 200 MG CAPS Take 200 mg by mouth 2 (two) times a week. Take on Tues & Thurs    [provider]  ezetimibe (ZETIA) 10 MG tablet TAKE 1 TABLET DAILY Patient taking differently: Take 10 mg by mouth daily.  07/11/18   Lelon Perla, MD    furosemide (LASIX) 40 MG tablet Take 40 mg by mouth daily.    [provider]  insulin regular human CONCENTRATED (HUMULIN R) 500 UNIT/ML injection Inject up to 200 units a day as advised Patient taking differently: Inject 100-140 Units into the skin 2 (two) times daily with a meal. Take 140 units at breakfast and Take 100 units at lunch. 06/29/18   Philemon Kingdom, MD  metFORMIN (GLUCOPHAGE-XR) 500 MG 24 hr tablet Take 1000 mg with lunch 09/15/18   Philemon Kingdom, MD  methocarbamol (ROBAXIN) 750 MG tablet Take 1 tablet by mouth daily. 11/20/18   [provider]  Multiple Vitamins-Minerals (CENTRUM SILVER PO) Take 1 tablet by mouth daily.    [provider]  nitroGLYCERIN (NITROSTAT) 0.4 MG SL tablet Place 1 tablet (0.4 mg total) under the tongue every 5 (five) minutes as needed for chest pain. 07/03/18   Lelon Perla, MD  NON FORMULARY Take 1 tablet by mouth daily. Methyfolate 2000 MCG daily     [provider]  omega-3 acid ethyl esters (LOVAZA) 1 g capsule TAKE 2 CAPSULES TWICE A DAY Patient taking differently: Take 2 g by mouth 2 (two) times daily. TAKE 2 CAPSULES TWICE A DAY 04/04/18   Lelon Perla, MD  Peppermint Oil (IBGARD) 90 MG CPCR Take 90 mg by mouth 2 (two) times daily.    [provider]  potassium chloride (K-DUR) 10 MEQ tablet Take 10 mEq by mouth 2 (two) times daily. Take two tablets in the morning and two tablets in the evening. 10/28/18   [provider]  pravastatin (PRAVACHOL) 80 MG tablet TAKE 1 TABLET DAILY Patient taking differently: Take 80 mg by mouth daily.  03/13/18   Lelon Perla, MD  Probiotic Product (PROBIOTIC DAILY PO) Take 1 tablet by mouth daily.    [provider]  spironolactone (ALDACTONE) 25 MG tablet Take 25 mg by mouth daily.    [provider]  sulfamethoxazole-trimethoprim (BACTRIM DS,SEPTRA DS) 800-160 MG tablet Take 1 tablet by mouth 2 (two) times daily. for 7 days  11/20/18   [provider]  TRICOR 48 MG tablet TAKE 1 TABLET DAILY Patient taking differently: Take 48 mg by mouth daily.  07/11/18   Lelon Perla, MD  XARELTO 20 MG TABS tablet TAKE 1 TABLET DAILY WITH SUPPER Patient taking differently: Take 20 mg by mouth every evening.  03/13/18   Lelon Perla, MD    Family History Family History  Problem Relation Age of Onset   Heart disease Other        No family history    Social History Social History   Tobacco Use   Smoking status: Never Smoker   Smokeless tobacco: Never Used  Substance Use Topics   Alcohol use: Yes    Alcohol/week: 0.0 standard drinks    Comment: once drink a month   Drug use: No     Allergies   Feraheme [ferumoxytol]; Iodine; Seasonal ic [cholestatin]; Tape; Contrast media [iodinated diagnostic agents]; and Ioxaglate   Review of Systems Review of Systems  All other systems reviewed and are negative.    Physical Exam Updated Vital Signs BP (!) 94/40    Pulse (!) 32    Resp 16    Ht 6' 1"  (1.854 m)    Wt (!) 142.6 kg    SpO2 98%    BMI 41.48 kg/m   Physical Exam Vitals signs and nursing note reviewed.  Constitutional:      General: He is not in acute distress.    Appearance: He is well-developed. He is obese.  HENT:     Head: Normocephalic and atraumatic.  Eyes:     Conjunctiva/sclera: Conjunctivae normal.     Pupils: Pupils are equal, round, and reactive to light.  Neck:     Musculoskeletal: Normal range of motion and neck supple.  Cardiovascular:     Rate and Rhythm: Regular rhythm. Bradycardia present.     Heart sounds: No murmur.  Pulmonary:     Effort: Pulmonary effort is normal. No respiratory distress.     Breath sounds: Normal  breath sounds. No wheezing or rales.  Abdominal:     General: There is no distension.     Palpations: Abdomen is soft.     Tenderness: There is no abdominal tenderness. There is no guarding or rebound.  Musculoskeletal: Normal range of  motion.        General: No tenderness.     Left lower leg: No edema.     Comments: Lower extremity is wrapped in an Haematologist.  There is no foot redness or swelling.  Pulses are intact.  Dressing removed above the right ankle patient has several bulla present some that have ruptured with a yellow fluid.  No significant tenderness.  Mild erythema with mild warmth  Skin:    General: Skin is warm and dry.     Coloration: Skin is pale.     Findings: No erythema or rash.  Neurological:     Mental Status: He is alert and oriented to person, place, and time.  Psychiatric:        Behavior: Behavior normal.      ED Treatments / Results  Labs (all labs ordered are listed, but only abnormal results are displayed) Labs Reviewed  CBC WITH DIFFERENTIAL/PLATELET - Abnormal; Notable for the following components:      Result Value   RBC 3.98 (*)    Hemoglobin 11.7 (*)    HCT 37.5 (*)    RDW 18.7 (*)    Platelets 115 (*)    Monocytes Absolute 1.2 (*)    All other components within normal limits  COMPREHENSIVE METABOLIC PANEL - Abnormal; Notable for the following components:   Sodium 125 (*)    Potassium 6.2 (*)    Chloride 95 (*)    CO2 19 (*)    Glucose, Bld 342 (*)    BUN 58 (*)    Creatinine, Ser 2.78 (*)    Calcium 8.8 (*)    Albumin 3.1 (*)    AST 85 (*)    ALT 94 (*)    Alkaline Phosphatase 151 (*)    GFR calc non Af Amer 21 (*)    GFR calc Af Amer 24 (*)    All other components within normal limits  LACTIC ACID, PLASMA  I-STAT TROPONIN, ED    EKG EKG Interpretation  Date/Time:  Sunday December 03 2018 14:04:02 EDT Ventricular Rate:  86 PR Interval:    QRS Duration: 183 QT Interval:  341 QTC Calculation: 264 R Axis:   -82 Text Interpretation:  with complete heart block new  Junctional bradycardia Nonspecific IVCD with LAD Inferior infarct, old Anterolateral infarct, age indeterminate Confirmed by Blanchie Dessert ([id number]) on 12/03/2018 2:22:32 PM   Radiology Ct  Abdomen W Wo Contrast  Result Date: 12/01/2018 CLINICAL DATA:  Cirrhosis. Indeterminate echogenic focus in right liver on recent ultrasound. EXAM: CT ABDOMEN WITHOUT AND WITH CONTRAST TECHNIQUE: Multidetector CT imaging of the abdomen was performed following the standard protocol before and following the bolus administration of intravenous contrast. CONTRAST:  147m OMNIPAQUE IOHEXOL 300 MG/ML  SOLN COMPARISON:  05/26/2018 CT abdomen. FINDINGS: Lower chest: No significant pulmonary nodules or acute consolidative airspace disease. Punctate right lung base calcified granuloma. ICD leads are seen terminating in the right atrium and right ventricle. Coronary atherosclerosis. Newly mildly enlarged 1.1 cm right pericardiophrenic node (series 9/image 21). Hepatobiliary: Diffusely irregular liver surface, compatible with hepatic cirrhosis. There is an 8.0 x 6.0 cm right liver dome mass (series 5/image 14) with mild arterial phase hyperenhancement, no clear  washout or enhancing capsule, not well visualized on the 05/26/2018 CT, favored to be increased from 5.3 x 4.5 cm, compatible with LI-RADS category 5. surrounding heterogeneous enhancement and scattered subcentimeter nodules in the superior right liver lobe, not appreciably changed. Cholelithiasis. No gallbladder wall thickening. No biliary ductal dilatation. Pancreas: Normal, with no mass or duct dilation. Spleen: Moderate splenomegaly (craniocaudal splenic length 17.0 cm). No splenic mass. Adrenals/Urinary Tract: Left adrenal 3.5 cm nodule with precontrast density 41 HU, new since 05/26/2018 CT. No right adrenal nodule. Nonobstructing 5 mm lower left renal stones. Borderline mild bilateral hydronephrosis, new. Stomach/Bowel: Normal non-distended stomach. Visualized small and large bowel is normal caliber, with no bowel wall thickening. Vascular/Lymphatic: Atherosclerotic nonaneurysmal abdominal aorta. Patent portal, splenic, hepatic and renal veins. No pathologically  enlarged lymph nodes in the abdomen. Other: No pneumoperitoneum, ascites or focal fluid collection. Musculoskeletal: No aggressive appearing focal osseous lesions. Marked thoracolumbar spondylosis. IMPRESSION: 1. Cirrhosis. Right liver dome 8.0 cm mass, LI-RADS category 5 (definitely hepatocellular carcinoma). 2. New 3.5 cm left adrenal nodule, compatible with left adrenal metastasis. 3. New right pericardiophrenic adenopathy suggesting nodal metastasis. 4. Moderate splenomegaly. 5. Borderline mild bilateral hydronephrosis is new, recommend correlation with serum creatinine. Nonobstructing left nephrolithiasis. 6. Cholelithiasis. 7.  Aortic Atherosclerosis (ICD10-I70.0). These results will be called to the ordering clinician or representative by the Radiologist Assistant, and communication documented in the PACS or zVision Dashboard. Electronically Signed   By: Ilona Sorrel M.D.   On: 12/01/2018 17:55   Dg Chest Port 1 View  Result Date: 12/03/2018 CLINICAL DATA:  Near syncope. EXAM: PORTABLE CHEST 1 VIEW COMPARISON:  September 07, 2018 FINDINGS: Stable AICD device. Cardiomegaly, unchanged. The hila, mediastinum, lungs, and pleura are unremarkable. A transcutaneous pacer lead overlies the upper medial left chest. IMPRESSION: No acute interval change. Electronically Signed   By: Dorise Bullion III M.D   On: 12/03/2018 14:51    Procedures Procedures (including critical care time)  Medications Ordered in ED Medications - No data to display   Initial Impression / Assessment and Plan / ED Course  I have reviewed the triage vital signs and the nursing notes.  Pertinent labs & imaging results that were available during my care of the patient were reviewed by me and considered in my medical decision making (see chart for details).       Patient presenting today with near syncope that started today but states for the last 3 to 4 days has not been feeling great.  Recently started on Bactrim due to  ongoing wound infection but no other medication changes.  Patient found to be in heart block today with a rate of 30.  This is most likely the cause of his near syncope.  He is denying any chest pain or shortness of breath.  He is comfortable on exam but does have a blood pressure in the high 90s.  Patient has an ICD but when he saw [name] on 11/23/2018 he was put in a monitor only zone.  EKG shows heart block with a rate in the 30s.  Discussed case with Dr. Percival Spanish with cardiology and they will come evaluate the patient.  Pacemaker was interrogated.  Labs are pending.  3:08 PM Dr. Caryl Comes came and saw the patient and reset his pacemaker now heart rate is in the 60s.  However he is concerned about his worsening wound on his leg that has not improved now that he is on his second round of Bactrim and is concerned  that if the wound continues it may infect his device.  He is requesting admission for the patient by the medicine service for further care and possible infectious disease consult.  Patient now on his second round of Bactrim.  CMP today is consistent with new acute kidney injury with a creatinine of 2.78 potassium of 6.2 and elevated blood sugar of 342.  This is most likely result of the antibiotic.  Troponin within normal limits, chest x-ray within normal limits.  Patient is afebrile here lactate is pending. CBC and lactate wnl.  Pt given IVF due to hypotension despite improvement in HR.  Pt feeling better.  Repeat BP now in the 90's to 100's which is baseline per family.  Pt admitted for further care.  CRITICAL CARE Performed by: Jayvion Stefanski Total critical care time: 30 minutes Critical care time was exclusive of separately billable procedures and treating other patients. Critical care was necessary to treat or prevent imminent or life-threatening deterioration. Critical care was time spent personally by me on the following activities: development of treatment plan with patient and/or  surrogate as well as nursing, discussions with consultants, evaluation of patient's response to treatment, examination of patient, obtaining history from patient or surrogate, ordering and performing treatments and interventions, ordering and review of laboratory studies, ordering and review of radiographic studies, pulse oximetry and re-evaluation of patient's condition.   Final Clinical Impressions(s) / ED Diagnoses   Final diagnoses:  Complete heart block ([place])  Acute kidney injury [facility])  Hyperkalemia    ED Discharge Orders    None       Blanchie Dessert, MD 12/03/18 1617

## 2018-12-03 NOTE — H&P (Addendum)
History and Physical    Robert Gregory QMG:[id number] DOB: Feb 07, 1941 DOA: 12/03/2018  PCP: Seward Carol, MD Consultants:  Odetta Pink - oncology; [facility] - cardiology; Lovena Le - EP; Cruzita Lederer - endocrinology Patient coming from:  Home - lives with wife; Donald Prose: Wife, [phone]; [phone]  Chief Complaint: near syncope  HPI: Robert Gregory is a 78 y.o. male with medical history significant of DM; stage 3 CKD; OSA; CAD s/p CABG; AICD placement; HTN; HLD; and afib on Xarelto presenting with near syncope.  He has had a chronic unhealing wound on his RLE with 5 rounds of antibiotics in the last 4 months.   This AM, he woke up very light-headed and had difficulty getting to the bathroom.  He has some nausea without vomiting this AM.  It didn't get better and he finally decided to check his BP and it was "extremely low", 88/39, P 30.  He called 911 and his pulse never increased over 30.  EP adjusted his AICD and he feels much better.  He has had constipation associated with oxycodone with BRBPR that they attribute to hemorrhoids.  He has not been up since arriving.  He is having significant pain in his pain - worsening in the last few weeks.  They discovered last week that he has a cracked vertebrae with a suspicion lesion behind it.  His leg was looking well, as it has about 4 times now, but "it's looking pretty nasty now."  He thinks the Unaboot might have made it worse.  He had been seeing [name] for low platelet count and went to Dr. Paulita Fujita for screening colonoscopy and he noticed some elevated LFTs.  Markedly elevated AFP.  [place] with cirrhosis, MRI recommended.  Dr. Paulita Fujita called him at home Friday night and said it looks like absolutely liver cancer.  He and [name] are supposed to meet this week to discover.     ED Course:   Chronic RLE wound Rocephin, Keflex x 3, Unaboot, Bactrim x 1 round and restarted, ?slightly worse.  Today, near syncope, complete heart block in 30s.  Dr. Caryl Comes  adjusted the device, now better.  Creatinine 1.5 -> 2.8, K+ 6.0.  Will recheck.  No CP, SOB.  His leg looks ok but if not getting better the wound could seed his device.  Review of Systems: As per HPI; otherwise review of systems reviewed and negative.   Ambulatory Status:  Ambulates with a cane  Past Medical History:  Diagnosis Date   Arthritis    Atrial fibrillation ([place])    takes Xarelto daily   Cellulitis    RLE   Cholelithiasis    Chronic back pain    scoliosis/arthritis   Complication of anesthesia    forgetful for about 2-3wks after anesthesia   Hepatocellular carcinoma ([place])    History of colon polyps    Hyperlipidemia    takes Zetia,Pravastatin,and Tricor daily   Hypertension    takes Benicar and Coreg daily   ICD (implantable cardioverter-defibrillator) in place 11/07/2013   Iron deficiency anemia due to chronic blood loss 08/24/2018   Iron malabsorption 08/24/2018   Ischemic cardiomyopathy    Myocardial infarction ([place]) 1997   on the OR table    Nephrolithiasis    Obstructive sleep apnea    Peripheral neuropathy    Pernicious anemia 08/24/2018   Spinal stenosis    Thrombocytopenia (HCC)    Total knee replacement status 11/29/2014   Type 2 diabetes mellitus with stage 3  chronic kidney disease ([place]) 05/19/2015    Past Surgical History:  Procedure Laterality Date   CARDIAC CATHETERIZATION N/A 10/02/2015   Procedure: Left Heart Cath and Cors/Grafts Angiography;  Surgeon: Belva Crome, MD;  Location: [facility];  Service: Cardiovascular;  Laterality: N/A;   COLONOSCOPY     coronary artery bypass and graft  1997   x 5   ESOPHAGOGASTRODUODENOSCOPY     ICD placed     LAMINECTOMY     LITHOTRIPSY     Right rotator cuff surgery     TONSILLECTOMY     adenoids   TOTAL KNEE ARTHROPLASTY Left    TOTAL KNEE ARTHROPLASTY Right 11/29/2014   Procedure: RIGHT TOTAL KNEE ARTHROPLASTY;  Surgeon: Leandrew Koyanagi, MD;  Location: [facility];   Service: Orthopedics;  Laterality: Right;    Social History   Socioeconomic History   Marital status: Married    Spouse name: Not on file   Number of children: Not on file   Years of education: Not on file   Highest education level: Not on file  Occupational History    Comment: Retired from [employer] resource strain: Not on file   Food insecurity:    Worry: Not on file    Inability: Not on file   Transportation needs:    Medical: Not on file    Non-medical: Not on file  Tobacco Use   Smoking status: Never Smoker   Smokeless tobacco: Never Used  Substance and Sexual Activity   Alcohol use: Yes    Alcohol/week: 0.0 standard drinks    Comment: once drink a month   Drug use: No   Sexual activity: Not on file  Lifestyle   Physical activity:    Days per week: Not on file    Minutes per session: Not on file   Stress: Not on file  Relationships   Social connections:    Talks on phone: Not on file    Gets together: Not on file    Attends religious service: Not on file    Active member of club or organization: Not on file    Attends meetings of clubs or organizations: Not on file    Relationship status: Not on file   Intimate partner violence:    Fear of current or ex partner: Not on file    Emotionally abused: Not on file    Physically abused: Not on file    Forced sexual activity: Not on file  Other Topics Concern   Not on file  Social History Narrative   Not on file    Allergies  Allergen Reactions   Contrast Media [Iodinated Diagnostic Agents] Rash and Other (See Comments)    Extreme flushing   Ioxaglate Other (See Comments)    Extreme flushing   Feraheme [Ferumoxytol] Other (See Comments)    Patient had  reaction during infusion in Feb 2020 decision made to change to [facility] (passed out)   Iodine Other (See Comments)    flushing   Seasonal Ic [Cholestatin] Other (See Comments)    Unknown reaction    Adhesive [Tape] Rash    Adhesive tapes causes redness/itching/rash after removal    Family History  Problem Relation Age of Onset   Heart disease Other        No family history    Prior to Admission medications   Medication Sig Start Date End Date Taking? Authorizing Provider  amoxicillin (AMOXIL) 500 MG  capsule Take 500 mg by mouth as directed. 6 prior to dental work    [provider]  BD INSULIN SYRINGE U-500 31G X 6MM 0.5 ML MISC USE 1 SYRINGE TO INJECT UNDER THE SKIN THREE TIMES A DAY BEFORE MEALS 11/11/17   Philemon Kingdom, MD  BENICAR 40 MG tablet TAKE 1 TABLET TWICE A DAY 03/13/18   Lelon Perla, MD  carvedilol (COREG) 6.25 MG tablet Take 1 tablet (6.25 mg total) by mouth 2 (two) times daily with a meal. 05/31/18   Lelon Perla, MD  Cholecalciferol (VITAMIN D) 50 MCG (2000 UT) tablet Take 2,000 Units by mouth daily.    [provider]  Coenzyme Q10 200 MG TABS Take 1 tablet by mouth daily.    [provider]  Cranberry 200 MG CAPS Take 200 mg by mouth 2 (two) times a week. Take on Tues & Thurs    [provider]  ezetimibe (ZETIA) 10 MG tablet TAKE 1 TABLET DAILY Patient taking differently: Take 10 mg by mouth daily.  07/11/18   Lelon Perla, MD  furosemide (LASIX) 40 MG tablet Take 40 mg by mouth daily.    [provider]  insulin regular human CONCENTRATED (HUMULIN R) 500 UNIT/ML injection Inject up to 200 units a day as advised Patient taking differently: Inject 100-140 Units into the skin 2 (two) times daily with a meal. Take 140 units at breakfast and Take 100 units at lunch. 06/29/18   Philemon Kingdom, MD  metFORMIN (GLUCOPHAGE-XR) 500 MG 24 hr tablet Take 1000 mg with lunch 09/15/18   Philemon Kingdom, MD  methocarbamol (ROBAXIN) 750 MG tablet Take 1 tablet by mouth daily. 11/20/18   [provider]  Multiple Vitamins-Minerals (CENTRUM SILVER PO) Take 1 tablet by mouth daily.    [provider]    nitroGLYCERIN (NITROSTAT) 0.4 MG SL tablet Place 1 tablet (0.4 mg total) under the tongue every 5 (five) minutes as needed for chest pain. 07/03/18   Lelon Perla, MD  NON FORMULARY Take 1 tablet by mouth daily. Methyfolate 2000 MCG daily     [provider]  omega-3 acid ethyl esters (LOVAZA) 1 g capsule TAKE 2 CAPSULES TWICE A DAY Patient taking differently: Take 2 g by mouth 2 (two) times daily. TAKE 2 CAPSULES TWICE A DAY 04/04/18   Lelon Perla, MD  Peppermint Oil (IBGARD) 90 MG CPCR Take 90 mg by mouth 2 (two) times daily.    [provider]  potassium chloride (K-DUR) 10 MEQ tablet Take 10 mEq by mouth 2 (two) times daily. Take two tablets in the morning and two tablets in the evening. 10/28/18   [provider]  pravastatin (PRAVACHOL) 80 MG tablet TAKE 1 TABLET DAILY Patient taking differently: Take 80 mg by mouth daily.  03/13/18   Lelon Perla, MD  Probiotic Product (PROBIOTIC DAILY PO) Take 1 tablet by mouth daily.    [provider]  spironolactone (ALDACTONE) 25 MG tablet Take 25 mg by mouth daily.    [provider]  sulfamethoxazole-trimethoprim (BACTRIM DS,SEPTRA DS) 800-160 MG tablet Take 1 tablet by mouth 2 (two) times daily. for 7 days 11/20/18   [provider]  TRICOR 48 MG tablet TAKE 1 TABLET DAILY Patient taking differently: Take 48 mg by mouth daily.  07/11/18   Lelon Perla, MD  XARELTO 20 MG TABS tablet TAKE 1 TABLET DAILY WITH SUPPER Patient taking differently: Take 20 mg by mouth every evening.  03/13/18   Lelon Perla, MD    Physical Exam: Vitals:   12/03/18 1630 12/03/18 1645 12/03/18 1700 12/03/18 1812  BP: (!) 103/53 (!) 100/58 (!) 101/58 117/62  Pulse: 63 64 64 63  Resp: 17 18 20    Temp:    97.6 F (36.4 C)  TempSrc:    Oral  SpO2: 98% 99% 98% 100%  Weight:    (!) 138.8 kg  Height:    6' 1"  (1.854 m)      General:  Appears calm and comfortable and is NAD  Eyes:  PERRL, EOMI,  normal lids, iris  ENT:  grossly normal hearing, lips & tongue, mmm  Neck:  no LAD, masses or thyromegaly; no carotid bruits  Cardiovascular:  RRR, no m/r/g. No LE edema.   Respiratory:   CTA bilaterally with no wheezes/rales/rhonchi.  Normal respiratory effort.  Abdomen:  soft, mildly TTP across entire abdomen at level of umbilicus, ND, NABS  Skin: mild RLE erythema with solitary bulla and stasis ulcers, no streaking       Musculoskeletal:  grossly normal tone BUE/BLE, good ROM, no bony abnormality  Psychiatric:  grossly normal mood and affect, speech fluent and appropriate, AOx3  Neurologic:  CN 2-12 grossly intact, moves all extremities in coordinated fashion, sensation intact    Radiological Exams on Admission: Dg Chest Port 1 View  Result Date: 12/03/2018 CLINICAL DATA:  Near syncope. EXAM: PORTABLE CHEST 1 VIEW COMPARISON:  September 07, 2018 FINDINGS: Stable AICD device. Cardiomegaly, unchanged. The hila, mediastinum, lungs, and pleura are unremarkable. A transcutaneous pacer lead overlies the upper medial left chest. IMPRESSION: No acute interval change. Electronically Signed   By: Dorise Bullion III M.D   On: 12/03/2018 14:51    EKG: Independently reviewed.  Complete heart block   Labs on Admission: I have personally reviewed the available labs and imaging studies at the time of the admission.  Pertinent labs:   Na++ 125; 133 on 2/25 K+ 6.2 CO2 19 Glucose 342 BUN 58/Creatinine 2.78/GFR 21; 40/1.32/52 on 2/25 Albumin 3.1 AST 85/ALT 94; 70/70 on 2/25 Troponin 0.03 WBC 8.3 Hgb 11.7 Lactate 1.8  Assessment/Plan Principal Problem:   Near syncope Active Problems:   Essential hypertension   Hyperlipidemia   Type 2 diabetes mellitus with stage 3 chronic kidney disease (HCC)   Chronic back pain   Obesity, Class III, BMI 40-49.9 (morbid obesity) (HCC)   Cellulitis of right lower extremity   Complete heart block (HCC)   Acute renal failure superimposed on  stage 3 chronic kidney disease (HCC)   Hepatocellular carcinoma (HCC)   Near syncope -Patient with multiple acute and chronic medical problems presenting with near syncope -EMS found patient with HR 30 -He does have an AICD in place but complete heart block was noted on evaluation in the ER -Dr. Caryl Comes reprogrammed the device and has recommended serial troponins -Dr. Percival Spanish is also consulting on the patient -Will admit to cardiac telemetry for now, although this problem appears to now be stabilized -Heparin rather than Xarelto for now for afib  Acute renal failure on CKD -Suspect prerenal component, ATN from hypoperfusion in the setting of persistent HR in the 30s, and possibly intrinsic component with hydronephrosis appreciated on recent CT -Will gently hydrate and follow -Avoid nephrotoxic agents where possible - hold ACE and Xarelto for now -Hyperkalemia is thought to be related to dehydration; will repeat now and in AM  RLE cellulitis -patient with refractory cellulitis despite multiple rounds of antibiotics -It  appears to be more associated with stasis changes at this time to me, rather than active infection -Will request wound care consult tomorrow -Vanc/Zosyn for now given bulla but this can likely be weaned quickly -Cardiology recommended ID consultation but at this time that may not be necessary; suggest recheck in AM  Hepatocellular CA -Recent diagnosis of apparent metastatic CA -Patient/family were notified by Dr. Paulita Fujita Friday night -The plan at that time was for outpatient discussion with Drs. Outlaw and Ennever, but will request inpatient consultation at this time -Dr. Paulita Fujita will see the patient in the AM -[name] was added to the treatment team, but may need to be called -Hepatorenal syndrome may need to be considered if renal function does not improve -Back pain and abdominal discomfort may be related to CA; will continue home Percocet and add Lidocaine patch  (reports possibly pathologic vertebral fracture - awaiting records)  DM -He takes large doses of insulin via U500 -He does n ot appear to have had a dose today, although he usually does not take this medication at night -Will give 1 dose tonight and resume home dose in AM -Cover with resistant-scale SSI -Hold Glucophage  Obesity -BMI 40 -May negatively impact wound healing  HTN -Continue Coreg  HLD -Hold Zetia, Lovaza, Pravachol, Tricor for now  DVT prophylaxis: Heparin Code Status:  DNR - confirmed with patient/family Family Communication: Wife and other family member were present throughout evaluation  Disposition Plan:  Home once clinically improved Consults called: GI; Oncology (added to treatment team, may need to be called); Cardiology; EP; wound care  Admission status: Admit - It is my clinical opinion that admission to INPATIENT is reasonable and necessary because of the expectation that this patient will require hospital care that crosses at least 2 midnights to treat this condition based on the medical complexity of the problems presented.  Given the aforementioned information, the predictability of an adverse outcome is felt to be significant.    Karmen Bongo MD [facility]   How to contact the [facility] Attending or Consulting provider [place] or covering provider during after hours [place], for this patient?  1. Check the care team in [facility] and look for a) attending/consulting [facility] provider listed and b) the [facility] team listed 2. Log into [email or web address] and use [place]'s universal password to access. If you do not have the password, please contact the hospital operator. 3. Locate the [place] provider you are looking for under [facility] and page to a number that you can be directly reached. 4. If you still have difficulty reaching the provider, please page the [facility] (Director on Call) for the Hospitalists listed on amion for assistance.   12/03/2018, 6:23 PM

## 2018-12-03 NOTE — Progress Notes (Signed)
CRITICAL VALUE ALERT  Critical Value:  Trop 0.06  Date & Time Notied:  12/03/2018 2330  Provider Notified: [place]  Orders Received/Actions taken:

## 2018-12-03 NOTE — Progress Notes (Signed)
CPAP set up for pt's use HS.  Fitted for mask and and home settings confirmed.  Pt states that he does not need help self administering CPAP.  Pt instructed to call if assistance is needed.  Will cont to monitor throughout the night.

## 2018-12-03 NOTE — Consult Note (Signed)
[place] Nurse wound consult note Reason for Consult:RLE chronic wound (since December (4 months).  Partial thickness lesion at medial aspect, two intact blisters at anterior LE. Wound type:Venous insufficiency Pressure Injury POA: NA Measurement: Two intact serum filled blisters at the right anterior LE in a 3.5cm x 3cm area with 0.5cm elevation. R medial LE:  3cm x 4.8cm x 0.1cm partial thickness lesions with red, dry wound bed. Wound bed:As described above Drainage (amount, consistency, odor) As described above.(None) Periwound: RLE with mild edema (patient and wife state this is better than it usually is).  LLE is unremarkable. Dressing procedure/placement/frequency: [place] will provide Nursing with orders for topical care using xeroform gauze and with compression via ACE bandage for the time being.  I believe patient would benefit from consultation with Dr. Eather Colas post discharge as he may be a candidate for his antimicrobial compression socks. If you agree, please refer/copnsult at time of discharge.  [place] nursing team will not follow, but will remain available to this patient, the nursing and medical teams.  Please re-consult if needed. Thanks, Maudie Flakes, MSN, RN, [place], Arther Abbott  Pager# [phone]

## 2018-12-03 NOTE — Progress Notes (Signed)
Pharmacy Antibiotic Note  Robert Gregory is a 78 y.o. male admitted on 12/03/2018 with cellulitis.  Pharmacy has been consulted for Vanc and Zosyn dosing.  Serum creat 2.78, est CrCL 33 ml/min  Plan: Zosyn 3.375 gm IV q8hr (extended infusion) Vancomycin 2 grams IV x 1, then variable dosing based on renal function.  Monitor renal function, C&S, and vanc levels as indicated.  Height: 6' 1"  (185.4 cm) Weight: (!) 314 lb 6 oz (142.6 kg) IBW/kg (Calculated) : 79.9  No data recorded.  Recent Labs  Lab 12/03/18 1417 12/03/18 1625  WBC 8.3  --   CREATININE 2.78*  --   LATICACIDVEN  --  1.8    Estimated Creatinine Clearance: 33 mL/min (A) (by C-G formula based on SCr of 2.78 mg/dL (H)).    Allergies  Allergen Reactions  . Contrast Media [Iodinated Diagnostic Agents] Rash and Other (See Comments)    Extreme flushing  . Ioxaglate Other (See Comments)    Extreme flushing  . Feraheme [Ferumoxytol] Other (See Comments)    Patient had  reaction during infusion in Feb 2020 decision made to change to [facility] (passed out)  . Iodine Other (See Comments)    flushing  . Seasonal Ic [Cholestatin] Other (See Comments)    Unknown reaction  . Adhesive [Tape] Rash    Adhesive tapes causes redness/itching/rash after removal    Antimicrobials this admission: Vanc 3/15 >>  Zosyn 3/15 >>   Thank you for allowing pharmacy to be a part of this patient's care.  Alanda Slim, PharmD, [facility] Clinical Pharmacist Please see AMION for all Pharmacists' Contact Phone Numbers 12/03/2018, 5:35 PM

## 2018-12-03 NOTE — Consult Note (Signed)
CARDIOLOGY CONSULT NOTE  Patient ID: Robert Gregory MRN: [id number] DOB/AGE: [birth year] 78 y.o.  Admit date: 12/03/2018 Primary Physician Seward Carol, MD Primary Cardiologist   Cristopher Peru, MD  Kirk Ruths  MD Chief Complaint     Near syncope Requesting  Dr. Maryan Rued  HPI:  The patient is status post coronary artery bypassing graft in 1997. He has aa LIMA to the LAD, saphenous vein graft to the diagonal, saphenous vein graft to the circumflex and sequential saphenous vein graft to the PDA and posterior lateral. He was living in [place]. Echocardiogram in 2005 showed an ejection fraction of 38%, restrictive filling and trace tricuspid regurgitation. He hs an ICD placed previously. Also with h/o atrial fibrillation.  Cardiac catheterization January 2017 showed severe three-vessel coronary disease. There was total occlusion of the saphenous vein graft to the obtuse marginal, total occlusion of the saphenous vein graft to diagonal and there was a 50-70% stenosis in the sequential vein graft to the PDA and left ventricular branch and the LIMA to the LAD was patent. Ejection fraction 35-45%. Medical therapy recommended.   EF in Dec showed an EF of 55 - 60%.   He was last seen by [name] and has had his device programmed.    He presented today with near syncope.  He said that he woke and had light headedness.  He felt nauseated and had dry heaves.  He had been reasonably well recently.  He has had no fevers chills, SOB, cough PND or orthopnea.  He has had lower extremity edema and chronic venous stasis changes with non healing leg wounds.     He has had underlying conduction disease.  Today he is found to have CHB.  Dr. Caryl Comes reprogrammed his device from VVI to DDD pacing.  He feels already better with resolution of his light headedness.    Past Medical History:  Diagnosis Date  . Arthritis   . Atrial fibrillation ([place])    takes Xarelto daily  . CAD (coronary artery disease)   .  Cardiomyopathy, ischemic 11/07/2013  . Cholelithiasis   . Chronic back pain    scoliosis/arthritis  . Complication of anesthesia    forgetful for about 2-3wks after anesthesia  . Congestive heart failure ([place])    takes Spironolactone daily  . Diabetes mellitus ([place])    takes Invokamet daily as well as Hum U  . History of colon polyps   . History of kidney stones   . Hyperlipidemia    takes San[employer]-SCI daily  . Hyperlipidemia 11/07/2013  . Hypertension    takes Benicar and Coreg daily  . ICD (implantable cardioverter-defibrillator) battery depletion   . ICD (implantable cardioverter-defibrillator) in place 11/07/2013  . Iron deficiency anemia due to chronic blood loss 08/24/2018  . Iron malabsorption 08/24/2018  . Ischemic cardiomyopathy   . Ischemic cardiomyopathy   . Joint pain   . Myocardial infarction ([place]) 1997   on the OR table   . Nephrolithiasis   . Obstructive sleep apnea   . Peripheral neuropathy   . Pernicious anemia 08/24/2018  . Pneumonia    as a child  . Spinal stenosis   . Thrombocytopenia ([place])   . Total knee replacement status 11/29/2014  . Type 2 diabetes mellitus with stage 3 chronic kidney disease ([place]) 05/19/2015  . Urinary frequency   . Urinary urgency     Past Surgical History:  Procedure Laterality Date  . CARDIAC CATHETERIZATION N/A 10/02/2015   Procedure: Left Heart  Cath and Cors/Grafts Angiography;  Surgeon: Belva Crome, MD;  Location: [facility];  Service: Cardiovascular;  Laterality: N/A;  . COLONOSCOPY    . coronary artery bypass and graft  1997   x 5  . ESOPHAGOGASTRODUODENOSCOPY    . ICD placed    . LAMINECTOMY    . LITHOTRIPSY    . Right rotator cuff surgery    . TONSILLECTOMY     adenoids  . TOTAL KNEE ARTHROPLASTY Left   . TOTAL KNEE ARTHROPLASTY Right 11/29/2014   Procedure: RIGHT TOTAL KNEE ARTHROPLASTY;  Surgeon: Leandrew Koyanagi, MD;  Location: [facility];  Service: Orthopedics;  Laterality: Right;    Allergies   Allergen Reactions  . Feraheme [Ferumoxytol] Other (See Comments)    Patient has reaction during infusion in Feb 2020 decision made to change to [facility]  . Iodine     flushing  . Seasonal Ic [Cholestatin]   . Tape Other (See Comments)    Rash  Rash   . Contrast Media [Iodinated Diagnostic Agents] Rash and Other (See Comments)    flushing EXTREME FLUSHING   . Ioxaglate Other (See Comments) and Rash    flushing EXTREME FLUSHING    Prior to Admission medications   Medication Sig Start Date End Date Taking? Authorizing Provider  acetaminophen (TYLENOL) 500 MG tablet Take 1,000 mg by mouth every 6 (six) hours as needed for headache (pain).   Yes [provider]  BD INSULIN SYRINGE U-500 31G X 6MM 0.5 ML MISC USE 1 SYRINGE TO INJECT UNDER THE SKIN THREE TIMES A DAY BEFORE MEALS Patient taking differently: Inject into the skin 2 (two) times daily.  11/11/17  Yes Philemon Kingdom, MD  BENICAR 40 MG tablet TAKE 1 TABLET TWICE A DAY Patient taking differently: Take 40 mg by mouth 2 (two) times daily.  03/13/18  Yes Lelon Perla, MD  carvedilol (COREG) 6.25 MG tablet Take 1 tablet (6.25 mg total) by mouth 2 (two) times daily with a meal. 05/31/18  Yes Crenshaw, Denice Bors, MD  Cholecalciferol (VITAMIN D) 50 MCG (2000 UT) tablet Take 2,000 Units by mouth daily with supper.    Yes [provider]  Coenzyme Q10 200 MG capsule Take 200 mg by mouth daily with supper.    Yes [provider]  ezetimibe (ZETIA) 10 MG tablet TAKE 1 TABLET DAILY Patient taking differently: Take 10 mg by mouth daily.  07/11/18  Yes Lelon Perla, MD  furosemide (LASIX) 40 MG tablet Take 40 mg by mouth daily.   Yes [provider]  insulin regular human CONCENTRATED (HUMULIN R) 500 UNIT/ML injection Inject up to 200 units a day as advised Patient taking differently: Inject 35-150 Units into the skin See admin instructions. Inject 150 units subcutaneously with breakfast (drawn on  insulin syringe) and 70 units with lunch. For CBG 100-120 decrease to 1/2 dose (75 units with breakfast and 35 units with lunch. If CBG <100 hold insulin. 06/29/18  Yes Philemon Kingdom, MD  Levomefolate Glucosamine (METHYLFOLATE PO) Take 2,000 mcg by mouth daily with supper.   Yes [provider]  metFORMIN (GLUCOPHAGE-XR) 500 MG 24 hr tablet Take 1000 mg with lunch Patient taking differently: Take 1,000 mg by mouth daily with lunch.  09/15/18  Yes Philemon Kingdom, MD  methocarbamol (ROBAXIN) 750 MG tablet Take 375 mg by mouth every 8 (eight) hours as needed for muscle spasms (back pain).  11/20/18  Yes [provider]  Multiple Vitamin (MULTIVITAMIN WITH  MINERALS) TABS tablet Take 1 tablet by mouth daily. Centrum Silver   Yes [provider]  nitroGLYCERIN (NITROSTAT) 0.4 MG SL tablet Place 1 tablet (0.4 mg total) under the tongue every 5 (five) minutes as needed for chest pain. 07/03/18  Yes Lelon Perla, MD  omega-3 acid ethyl esters (LOVAZA) 1 g capsule TAKE 2 CAPSULES TWICE A DAY Patient taking differently: Take 2 g by mouth 2 (two) times daily.  04/04/18  Yes Lelon Perla, MD  oxyCODONE-acetaminophen (PERCOCET) 7.5-325 MG tablet Take 1 tablet by mouth every 8 (eight) hours as needed for severe pain.   Yes [provider]  Peppermint Oil (IBGARD) 90 MG CPCR Take 90 mg by mouth 2 (two) times daily.   Yes [provider]  potassium chloride (K-DUR) 10 MEQ tablet Take 10 mEq by mouth daily with breakfast.  10/28/18  Yes [provider]  pravastatin (PRAVACHOL) 80 MG tablet TAKE 1 TABLET DAILY Patient taking differently: Take 80 mg by mouth at bedtime.  03/13/18  Yes Lelon Perla, MD  Probiotic Product (PROBIOTIC DAILY PO) Take 1 tablet by mouth 2 (two) times daily.    Yes [provider]  spironolactone (ALDACTONE) 25 MG tablet Take 25 mg by mouth daily.   Yes [provider]  TRICOR 48 MG tablet TAKE 1 TABLET  DAILY Patient taking differently: Take 48 mg by mouth daily.  07/11/18  Yes Crenshaw, Denice Bors, MD  XARELTO 20 MG TABS tablet TAKE 1 TABLET DAILY WITH SUPPER Patient taking differently: Take 20 mg by mouth daily with supper.  03/13/18  Yes Lelon Perla, MD    (Not in a hospital admission)  Family History  Problem Relation Age of Onset  . Heart disease Other        No family history    Social History   Socioeconomic History  . Marital status: Married    Spouse name: Not on file  . Number of children: Not on file  . Years of education: Not on file  . Highest education level: Not on file  Occupational History    Comment: Retired from [employer]  . Financial resource strain: Not on file  . Food insecurity:    Worry: Not on file    Inability: Not on file  . Transportation needs:    Medical: Not on file    Non-medical: Not on file  Tobacco Use  . Smoking status: Never Smoker  . Smokeless tobacco: Never Used  Substance and Sexual Activity  . Alcohol use: Yes    Alcohol/week: 0.0 standard drinks    Comment: once drink a month  . Drug use: No  . Sexual activity: Not on file  Lifestyle  . Physical activity:    Days per week: Not on file    Minutes per session: Not on file  . Stress: Not on file  Relationships  . Social connections:    Talks on phone: Not on file    Gets together: Not on file    Attends religious service: Not on file    Active member of club or organization: Not on file    Attends meetings of clubs or organizations: Not on file    Relationship status: Not on file  . Intimate partner violence:    Fear of current or ex partner: Not on file    Emotionally abused: Not on file    Physically abused: Not on file    Forced sexual  activity: Not on file  Other Topics Concern  . Not on file  Social History Narrative  . Not on file     ROS:    As stated in the HPI and negative for all other systems.  Physical Exam: Blood pressure (!)  100/45, pulse 62, resp. rate 18, height 6' 1"  (1.854 m), weight (!) 142.6 kg, SpO2 100 %.  GENERAL:  Well appearing HEENT:  Pupils equal round and reactive, fundi not visualized, oral mucosa unremarkable NECK:  No jugular venous distention, waveform within normal limits, carotid upstroke brisk and symmetric, no bruits, no thyromegaly LYMPHATICS:  No cervical, inguinal adenopathy LUNGS:  Clear to auscultation bilaterally BACK:  No CVA tenderness CHEST:  Well healed sternotomy scar, normal ICD pocket.  HEART:  PMI not displaced or sustained,S1 and S2 within normal limits, no S3, no S4, no clicks, no rubs, no murmurs ABD:  Flat, positive bowel sounds normal in frequency in pitch, no bruits, no rebound, no guarding, no midline pulsatile mass, no hepatomegaly, no splenomegaly, obese EXT:  2 plus pulses throughout, no edema, no cyanosis no clubbing, chronic venous stasis changes.  Chronic non healing right anterior tibial ulcer with edema and blistering SKIN:  No rashes no nodules NEURO:  Cranial nerves II through XII grossly intact, motor grossly intact throughout PSYCH:  Cognitively intact, oriented to person place and time   Labs: Lab Results  Component Value Date   BUN 58 (H) 12/03/2018   Lab Results  Component Value Date   CREATININE 2.78 (H) 12/03/2018   Lab Results  Component Value Date   NA 125 (L) 12/03/2018   K 6.2 (H) 12/03/2018   CL 95 (L) 12/03/2018   CO2 19 (L) 12/03/2018   Lab Results  Component Value Date   TROPONINI <0.03 11/14/2018   Lab Results  Component Value Date   WBC 8.3 12/03/2018   HGB 11.7 (L) 12/03/2018   HCT 37.5 (L) 12/03/2018   MCV 94.2 12/03/2018   PLT 115 (L) 12/03/2018   Lab Results  Component Value Date   CHOL 123 08/03/2017   HDL 26 (L) 08/03/2017   LDLCALC 26 08/03/2017   TRIG 355 (H) 08/03/2017   CHOLHDL 4.7 08/03/2017   Lab Results  Component Value Date   ALT 94 (H) 12/03/2018   AST 85 (H) 12/03/2018   ALKPHOS 151 (H) 12/03/2018    BILITOT 1.0 12/03/2018   Lab Results  Component Value Date   HGBA1C 7.0 (A) 08/24/2018    Radiology:   CXR: Stable AICD device. Cardiomegaly, unchanged. The hila, mediastinum, lungs, and pleura are unremarkable. A transcutaneous pacer lead overlies the upper medial left chest.  EKG:    CHB, ventricular escape rhythm  Lab Results  Component Value Date   CHOL 123 08/03/2017   TRIG 355 (H) 08/03/2017   HDL 26 (L) 08/03/2017   LDLCALC 26 08/03/2017    ASSESSMENT AND PLAN:   CHB:  Device reprogrammed as above.  Will discuss further with EP.   OK to continue beta blocker.    PAF:  Continue Xarelto.    CAD:  The patient has no new sypmtoms. Given the CHB I would suggest cycling troponin looking for a clear trend.   HTN:  The blood pressure is at target. No change in medications is indicated. We will continue with therapeutic lifestyle changes (TLC).  DYSLIPIDEMIA:  Guidelines would suggest that he should be on high or moderate dose statin.  I will defer to Dr. Stanford Breed  LEG:  Suggest ID consult given refractory ulcer with possible cellulitis.    DM:    Consider SGTL2i at discharge.       SignedMinus Breeding 12/03/2018, 3:49 PM

## 2018-12-03 NOTE — ED Triage Notes (Addendum)
Pt here via EMS with c/o dizziness and bradycardia. Pt denies CP and SHOB.  Pt A/O, mentation at baseline.  Pt has a [employer] ICD.  Pt also endorses and episode of LOC.   EMS administered 500ML of NS.

## 2018-12-03 NOTE — Progress Notes (Addendum)
Asked to see  pt by Dr. Vita Barley because of presentation with complete heart block and previously implanted dual chamber ICD [name]  Programmed VVI 30.  This was how it was reprogrammed when the patient arrived from [place] state more than 5 years ago.  He has baseline conduction system disease with right bundle branch block and significant first-degree AV block with some Mobitz 1 second-degree heart block.  BP (!) 96/42   Pulse 64   Resp 19   Ht 6' 1"  (1.854 m)   Wt (!) 142.6 kg   SpO2 97%   BMI 41.48 kg/m  Clear Slow and regular Edema R>L with R leg wrapped Device pocket well healed; without hematoma or erythema.  There is no tethering   CHB>>Device was interrogated.  Reprogrammed VVI-DDD.will need troponin to exclude ischemic event as a cause but I suspect this is just a manifestation of progressive conduction system disease  Lower extremity cellulitis In the context of his implanted device, we worry a lot about the potential for seeding from a cutaneous infection especially when it is not healing.  Would asked that we consider ID consultation to explore the depth of the infection etc.

## 2018-12-03 NOTE — Progress Notes (Signed)
ANTICOAGULATION CONSULT NOTE - Initial Consult  Pharmacy Consult for heparin  Indication: atrial fibrillation  Allergies  Allergen Reactions  . Contrast Media [Iodinated Diagnostic Agents] Rash and Other (See Comments)    Extreme flushing  . Ioxaglate Other (See Comments)    Extreme flushing  . Feraheme [Ferumoxytol] Other (See Comments)    Patient had  reaction during infusion in Feb 2020 decision made to change to [facility] (passed out)  . Iodine Other (See Comments)    flushing  . Seasonal Ic [Cholestatin] Other (See Comments)    Unknown reaction  . Adhesive [Tape] Rash    Adhesive tapes causes redness/itching/rash after removal    Patient Measurements: Height: 6' 1"  (185.4 cm) Weight: (!) 305 lb 14.4 oz (138.8 kg) IBW/kg (Calculated) : 79.9 Heparin Dosing Weight: 111kg  Vital Signs: Temp: 97.6 F (36.4 C) (03/15 1812) Temp Source: Oral (03/15 1812) BP: 117/62 (03/15 1812) Pulse Rate: 63 (03/15 1812)  Labs: Recent Labs    12/03/18 1417  HGB 11.7*  HCT 37.5*  PLT 115*  CREATININE 2.78*    Estimated Creatinine Clearance: 32.6 mL/min (A) (by C-G formula based on SCr of 2.78 mg/dL (H)).   Medical History: Past Medical History:  Diagnosis Date  . Arthritis   . Atrial fibrillation ([place])    takes Xarelto daily  . Cellulitis    RLE  . Cholelithiasis   . Chronic back pain    scoliosis/arthritis  . Complication of anesthesia    forgetful for about 2-3wks after anesthesia  . Hepatocellular carcinoma ([place])   . History of colon polyps   . Hyperlipidemia    takes [employer] daily  . Hypertension    takes Benicar and Coreg daily  . ICD (implantable cardioverter-defibrillator) in place 11/07/2013  . Iron deficiency anemia due to chronic blood loss 08/24/2018  . Iron malabsorption 08/24/2018  . Ischemic cardiomyopathy   . Myocardial infarction ([place]) 1997   on the OR table   . Nephrolithiasis   . Obstructive sleep apnea    CPAP  . Peripheral  neuropathy   . Pernicious anemia 08/24/2018  . Spinal stenosis   . Thrombocytopenia ([place])   . Total knee replacement status 11/29/2014  . Type 2 diabetes mellitus with stage 3 chronic kidney disease ([place]) 05/19/2015    Medications:  Medications Prior to Admission  Medication Sig Dispense Refill Last Dose  . acetaminophen (TYLENOL) 500 MG tablet Take 1,000 mg by mouth every 6 (six) hours as needed for headache (pain).   12/02/2018 at pm  . BD INSULIN SYRINGE U-500 31G X 6MM 0.5 ML MISC USE 1 SYRINGE TO INJECT UNDER THE SKIN THREE TIMES A DAY BEFORE MEALS (Patient taking differently: Inject into the skin 2 (two) times daily. ) 300 each 3 12/02/2018 at pm  . BENICAR 40 MG tablet TAKE 1 TABLET TWICE A DAY (Patient taking differently: Take 40 mg by mouth 2 (two) times daily. ) 180 tablet 3 12/03/2018 at am  . carvedilol (COREG) 6.25 MG tablet Take 1 tablet (6.25 mg total) by mouth 2 (two) times daily with a meal. 90 tablet 3 12/03/2018 at 1030-11a  . Cholecalciferol (VITAMIN D) 50 MCG (2000 UT) tablet Take 2,000 Units by mouth daily with supper.    12/02/2018 at pm  . Coenzyme Q10 200 MG capsule Take 200 mg by mouth daily with supper.    12/02/2018 at pm  . ezetimibe (ZETIA) 10 MG tablet TAKE 1 TABLET DAILY (Patient taking differently: Take 10 mg by  mouth daily. ) 90 tablet 4 12/03/2018 at am  . furosemide (LASIX) 40 MG tablet Take 40 mg by mouth daily.   12/03/2018 at am  . insulin regular human CONCENTRATED (HUMULIN R) 500 UNIT/ML injection Inject up to 200 units a day as advised (Patient taking differently: Inject 35-150 Units into the skin See admin instructions. Inject 150 units subcutaneously with breakfast (drawn on insulin syringe) and 70 units with lunch. For CBG 100-120 decrease to 1/2 dose (75 units with breakfast and 35 units with lunch. If CBG <100 hold insulin.) 3 vial 3 12/02/2018 at pm  . Levomefolate Glucosamine (METHYLFOLATE PO) Take 2,000 mcg by mouth daily with supper.   12/02/2018 at pm  .  metFORMIN (GLUCOPHAGE-XR) 500 MG 24 hr tablet Take 1000 mg with lunch (Patient taking differently: Take 1,000 mg by mouth daily with lunch. ) 180 tablet 3 12/02/2018 at lunch  . methocarbamol (ROBAXIN) 750 MG tablet Take 375 mg by mouth every 8 (eight) hours as needed for muscle spasms (back pain).    12/02/2018 at pm  . Multiple Vitamin (MULTIVITAMIN WITH MINERALS) TABS tablet Take 1 tablet by mouth daily. Centrum Silver   12/03/2018 at am  . nitroGLYCERIN (NITROSTAT) 0.4 MG SL tablet Place 1 tablet (0.4 mg total) under the tongue every 5 (five) minutes as needed for chest pain. 75 tablet 3 never taken  . omega-3 acid ethyl esters (LOVAZA) 1 g capsule TAKE 2 CAPSULES TWICE A DAY (Patient taking differently: Take 2 g by mouth 2 (two) times daily. ) 360 capsule 1 12/03/2018 at am  . oxyCODONE-acetaminophen (PERCOCET) 7.5-325 MG tablet Take 1 tablet by mouth every 8 (eight) hours as needed for severe pain.   12/01/2018  . Peppermint Oil (IBGARD) 90 MG CPCR Take 90 mg by mouth 2 (two) times daily.   12/02/2018 at pm  . potassium chloride (K-DUR) 10 MEQ tablet Take 10 mEq by mouth daily with breakfast.    12/03/2018 at am  . pravastatin (PRAVACHOL) 80 MG tablet TAKE 1 TABLET DAILY (Patient taking differently: Take 80 mg by mouth at bedtime. ) 90 tablet 3 12/02/2018 at pm  . Probiotic Product (PROBIOTIC DAILY PO) Take 1 tablet by mouth 2 (two) times daily.    12/02/2018 at pm  . spironolactone (ALDACTONE) 25 MG tablet Take 25 mg by mouth daily.   12/03/2018 at am  . TRICOR 48 MG tablet TAKE 1 TABLET DAILY (Patient taking differently: Take 48 mg by mouth daily. ) 90 tablet 4 12/03/2018 at am  . XARELTO 20 MG TABS tablet TAKE 1 TABLET DAILY WITH SUPPER (Patient taking differently: Take 20 mg by mouth daily with supper. ) 90 tablet 3 12/02/2018 at 1900-2000   Scheduled:  . carvedilol  6.25 mg Oral BID WC  . docusate sodium  100 mg Oral BID  . [START ON 12/04/2018] insulin aspart  0-20 Units Subcutaneous TID WC  .  insulin aspart  0-5 Units Subcutaneous QHS  . [START ON 12/04/2018] insulin regular human CONCENTRATED  150 Units Subcutaneous QAC breakfast  . [START ON 12/04/2018] insulin regular human CONCENTRATED  70 Units Subcutaneous QAC lunch  . insulin regular human CONCENTRATED  70 Units Subcutaneous Once  . lidocaine  1 patch Transdermal Q24H  . sodium chloride flush  3 mL Intravenous Q12H  . vancomycin variable dose per unstable renal function (pharmacist dosing)   Does not apply See admin instructions    Assessment: 78 yo male here with near syncope. He is on xarelto  for history of afib. He is noted with AKI (SCr= 2.78, last was 1.32 on 2/25) and plans to hold xarelto for now. Pharmacy consulted to dose heparin. Last dose of Xarelto was 3/14at ~ 7pm.   Goal of Therapy:  Heparin level 0.3-0.7 units/ml aPTT 66-102 seconds Monitor platelets by anticoagulation protocol: Yes   Plan:  -Start heparin at 1500 units/hr at 8pm -aPTT and heparin level in 8 hours  Hildred Laser, PharmD Clinical Pharmacist **Pharmacist phone directory can now be found on [email or web address] ([email or web address]).  Listed under [place].

## 2018-12-04 ENCOUNTER — Ambulatory Visit: Payer: Medicare Other | Admitting: Internal Medicine

## 2018-12-04 ENCOUNTER — Telehealth: Payer: Self-pay | Admitting: Internal Medicine

## 2018-12-04 DIAGNOSIS — K7460 Unspecified cirrhosis of liver: Secondary | ICD-10-CM

## 2018-12-04 DIAGNOSIS — K760 Fatty (change of) liver, not elsewhere classified: Secondary | ICD-10-CM

## 2018-12-04 DIAGNOSIS — D509 Iron deficiency anemia, unspecified: Secondary | ICD-10-CM

## 2018-12-04 DIAGNOSIS — C228 Malignant neoplasm of liver, primary, unspecified as to type: Secondary | ICD-10-CM

## 2018-12-04 DIAGNOSIS — D510 Vitamin B12 deficiency anemia due to intrinsic factor deficiency: Secondary | ICD-10-CM

## 2018-12-04 DIAGNOSIS — D696 Thrombocytopenia, unspecified: Secondary | ICD-10-CM

## 2018-12-04 LAB — TROPONIN I
Troponin I: 0.06 ng/mL (ref <0.03)
Troponin I: 0.05 ng/mL (ref <0.03)

## 2018-12-04 LAB — GLUCOSE, CAPILLARY
GLUCOSE-CAPILLARY: 93 mg/dL (ref 70–99)
GLUCOSE-CAPILLARY: 69 mg/dL — AB (ref 70–99)
Glucose-Capillary: 162 mg/dL — ABNORMAL HIGH (ref 70–99)
Glucose-Capillary: 221 mg/dL — ABNORMAL HIGH (ref 70–99)
Glucose-Capillary: 87 mg/dL (ref 70–99)
Glucose-Capillary: 87 mg/dL (ref 70–99)
Glucose-Capillary: 77 mg/dL (ref 70–99)

## 2018-12-04 LAB — HEPARIN LEVEL (UNFRACTIONATED): Heparin Unfractionated: 0.5 IU/mL (ref 0.30–0.70)

## 2018-12-04 LAB — BASIC METABOLIC PANEL
ANION GAP: 8 (ref 5–15)
BUN: 59 mg/dL — ABNORMAL HIGH (ref 8–23)
CALCIUM: 8.8 mg/dL — AB (ref 8.9–10.3)
CO2: 20 mmol/L — ABNORMAL LOW (ref 22–32)
Chloride: 103 mmol/L (ref 98–111)
Creatinine, Ser: 2.36 mg/dL — ABNORMAL HIGH (ref 0.61–1.24)
GFR calc Af Amer: 30 mL/min — ABNORMAL LOW (ref >60)
GFR calc non Af Amer: 26 mL/min — ABNORMAL LOW (ref >60)
Glucose, Bld: 205 mg/dL — ABNORMAL HIGH (ref 70–99)
Potassium: 5.4 mmol/L — ABNORMAL HIGH (ref 3.5–5.1)
Sodium: 131 mmol/L — ABNORMAL LOW (ref 135–145)

## 2018-12-04 LAB — CBC
HCT: 33.3 % — ABNORMAL LOW (ref 39.0–52.0)
Hemoglobin: 10.9 g/dL — ABNORMAL LOW (ref 13.0–17.0)
MCH: 30.3 pg (ref 26.0–34.0)
MCHC: 32.7 g/dL (ref 30.0–36.0)
MCV: 92.5 fL (ref 80.0–100.0)
Platelets: 88 10*3/uL — ABNORMAL LOW (ref 150–400)
RBC: 3.6 MIL/uL — ABNORMAL LOW (ref 4.22–5.81)
RDW: 18.5 % — ABNORMAL HIGH (ref 11.5–15.5)
WBC: 6.8 10*3/uL (ref 4.0–10.5)
nRBC: 0 % (ref 0.0–0.2)

## 2018-12-04 LAB — APTT: aPTT: 70 seconds — ABNORMAL HIGH (ref 24–36)

## 2018-12-04 LAB — VANCOMYCIN, RANDOM: Vancomycin Rm: 14

## 2018-12-04 MED ORDER — VANCOMYCIN HCL 10 G IV SOLR
1500.0000 mg | Freq: Once | INTRAVENOUS | Status: DC
  Filled 2018-12-04: qty 1500

## 2018-12-04 MED ORDER — ALBUMIN HUMAN 25 % IV SOLN
100.0000 g | Freq: Every day | INTRAVENOUS | Status: AC
  Administered 2018-12-04 – 2018-12-05 (×2): 100 g via INTRAVENOUS
  Filled 2018-12-04 (×3): qty 400

## 2018-12-04 MED ORDER — INSULIN REGULAR HUMAN (CONC) 500 UNIT/ML ~~LOC~~ SOPN
0.0000 [IU] | PEN_INJECTOR | Freq: Three times a day (TID) | SUBCUTANEOUS | Status: DC
  Filled 2018-12-04: qty 3

## 2018-12-04 MED ORDER — CEFAZOLIN SODIUM-DEXTROSE 1-4 GM/50ML-% IV SOLN
1.0000 g | Freq: Three times a day (TID) | INTRAVENOUS | Status: DC
  Administered 2018-12-04 – 2018-12-08 (×12): 1 g via INTRAVENOUS
  Filled 2018-12-04 (×14): qty 50

## 2018-12-04 MED ORDER — CALCITONIN (SALMON) 200 UNIT/ACT NA SOLN
1.0000 | Freq: Every day | NASAL | Status: DC
  Administered 2018-12-05 – 2018-12-28 (×23): 1 via NASAL
  Filled 2018-12-04 (×3): qty 3.7

## 2018-12-04 MED ORDER — CYANOCOBALAMIN 1000 MCG/ML IJ SOLN
1000.0000 ug | Freq: Once | INTRAMUSCULAR | Status: DC
  Filled 2018-12-04: qty 1

## 2018-12-04 NOTE — Progress Notes (Signed)
Pt places self on/off our device as needed. RT refilled water chamber and device is ready when pt is ready.  Pt aware to let RN know if he has any trouble. RT will continue to monitor.

## 2018-12-04 NOTE — Consult Note (Signed)
Referring Provider:  [place] Primary Care Physician:  Seward Carol, MD Primary Gastroenterologist:  Dr. Paulita Fujita   Reason for Consultation: Abnormal LFTs, hepatocellular carcinoma  HPI: Robert Gregory is a 78 y.o. male with past medical history of coronary artery disease status post CABG, history of AICD placement, history of atrial fibrillation on Xarelto, history of chronic kidney disease and diabetes presented to the hospital with syncope.  Was found to have heart block on initial evaluation.  GI is consulted because of recently diagnosed hepatocellular carcinoma.  Patient seen and examined at bedside.  He is complaining of significant back pain.  He is also complaining of bandlike pain in the upper abdomen.  Complaining of nausea but denied any vomiting.  Constipation is resolved now.  Denies any blood in the stool or black stool.  Complaining of decreased appetite.  Past Medical History:  Diagnosis Date  . Arthritis   . Atrial fibrillation ([place])    takes Xarelto daily  . Cellulitis    RLE  . Cholelithiasis   . Chronic back pain    scoliosis/arthritis  . Complication of anesthesia    forgetful for about 2-3wks after anesthesia  . Hepatocellular carcinoma ([place])   . History of colon polyps   . Hyperlipidemia    takes [employer] daily  . Hypertension    takes Benicar and Coreg daily  . ICD (implantable cardioverter-defibrillator) in place 11/07/2013  . Iron deficiency anemia due to chronic blood loss 08/24/2018  . Iron malabsorption 08/24/2018  . Ischemic cardiomyopathy   . Myocardial infarction ([place]) 1997   on the OR table   . Nephrolithiasis   . Obstructive sleep apnea    CPAP  . Peripheral neuropathy   . Pernicious anemia 08/24/2018  . Spinal stenosis   . Thrombocytopenia ([place])   . Total knee replacement status 11/29/2014  . Type 2 diabetes mellitus with stage 3 chronic kidney disease ([place]) 05/19/2015    Past Surgical History:  Procedure Laterality Date  .  CARDIAC CATHETERIZATION N/A 10/02/2015   Procedure: Left Heart Cath and Cors/Grafts Angiography;  Surgeon: Belva Crome, MD;  Location: [facility];  Service: Cardiovascular;  Laterality: N/A;  . COLONOSCOPY    . coronary artery bypass and graft  1997   x 5  . ESOPHAGOGASTRODUODENOSCOPY    . ICD placed    . LAMINECTOMY    . LITHOTRIPSY    . Right rotator cuff surgery    . TONSILLECTOMY     adenoids  . TOTAL KNEE ARTHROPLASTY Left   . TOTAL KNEE ARTHROPLASTY Right 11/29/2014   Procedure: RIGHT TOTAL KNEE ARTHROPLASTY;  Surgeon: Leandrew Koyanagi, MD;  Location: [facility];  Service: Orthopedics;  Laterality: Right;    Prior to Admission medications   Medication Sig Start Date End Date Taking? Authorizing Provider  acetaminophen (TYLENOL) 500 MG tablet Take 1,000 mg by mouth every 6 (six) hours as needed for headache (pain).   Yes [provider]  BD INSULIN SYRINGE U-500 31G X 6MM 0.5 ML MISC USE 1 SYRINGE TO INJECT UNDER THE SKIN THREE TIMES A DAY BEFORE MEALS Patient taking differently: Inject into the skin 2 (two) times daily.  11/11/17  Yes Philemon Kingdom, MD  BENICAR 40 MG tablet TAKE 1 TABLET TWICE A DAY Patient taking differently: Take 40 mg by mouth 2 (two) times daily.  03/13/18  Yes Lelon Perla, MD  carvedilol (COREG) 6.25 MG tablet Take 1 tablet (6.25 mg total) by mouth 2 (two)  times daily with a meal. 05/31/18  Yes Crenshaw, Denice Bors, MD  Cholecalciferol (VITAMIN D) 50 MCG (2000 UT) tablet Take 2,000 Units by mouth daily with supper.    Yes [provider]  Coenzyme Q10 200 MG capsule Take 200 mg by mouth daily with supper.    Yes [provider]  ezetimibe (ZETIA) 10 MG tablet TAKE 1 TABLET DAILY Patient taking differently: Take 10 mg by mouth daily.  07/11/18  Yes Lelon Perla, MD  furosemide (LASIX) 40 MG tablet Take 40 mg by mouth daily.   Yes [provider]  insulin regular human CONCENTRATED (HUMULIN R) 500 UNIT/ML injection  Inject up to 200 units a day as advised Patient taking differently: Inject 35-150 Units into the skin See admin instructions. Inject 150 units subcutaneously with breakfast (drawn on insulin syringe) and 70 units with lunch. For CBG 100-120 decrease to 1/2 dose (75 units with breakfast and 35 units with lunch. If CBG <100 hold insulin. 06/29/18  Yes Philemon Kingdom, MD  Levomefolate Glucosamine (METHYLFOLATE PO) Take 2,000 mcg by mouth daily with supper.   Yes [provider]  metFORMIN (GLUCOPHAGE-XR) 500 MG 24 hr tablet Take 1000 mg with lunch Patient taking differently: Take 1,000 mg by mouth daily with lunch.  09/15/18  Yes Philemon Kingdom, MD  methocarbamol (ROBAXIN) 750 MG tablet Take 375 mg by mouth every 8 (eight) hours as needed for muscle spasms (back pain).  11/20/18  Yes [provider]  Multiple Vitamin (MULTIVITAMIN WITH MINERALS) TABS tablet Take 1 tablet by mouth daily. Centrum Silver   Yes [provider]  nitroGLYCERIN (NITROSTAT) 0.4 MG SL tablet Place 1 tablet (0.4 mg total) under the tongue every 5 (five) minutes as needed for chest pain. 07/03/18  Yes Lelon Perla, MD  omega-3 acid ethyl esters (LOVAZA) 1 g capsule TAKE 2 CAPSULES TWICE A DAY Patient taking differently: Take 2 g by mouth 2 (two) times daily.  04/04/18  Yes Lelon Perla, MD  oxyCODONE-acetaminophen (PERCOCET) 7.5-325 MG tablet Take 1 tablet by mouth every 8 (eight) hours as needed for severe pain.   Yes [provider]  Peppermint Oil (IBGARD) 90 MG CPCR Take 90 mg by mouth 2 (two) times daily.   Yes [provider]  potassium chloride (K-DUR) 10 MEQ tablet Take 10 mEq by mouth daily with breakfast.  10/28/18  Yes [provider]  pravastatin (PRAVACHOL) 80 MG tablet TAKE 1 TABLET DAILY Patient taking differently: Take 80 mg by mouth at bedtime.  03/13/18  Yes Lelon Perla, MD  Probiotic Product (PROBIOTIC DAILY PO) Take 1 tablet by mouth 2 (two)  times daily.    Yes [provider]  spironolactone (ALDACTONE) 25 MG tablet Take 25 mg by mouth daily.   Yes [provider]  TRICOR 48 MG tablet TAKE 1 TABLET DAILY Patient taking differently: Take 48 mg by mouth daily.  07/11/18  Yes Crenshaw, Denice Bors, MD  XARELTO 20 MG TABS tablet TAKE 1 TABLET DAILY WITH SUPPER Patient taking differently: Take 20 mg by mouth daily with supper.  03/13/18  Yes Lelon Perla, MD    Scheduled Meds: . carvedilol  6.25 mg Oral BID WC  . cyanocobalamin  1,000 mcg Intramuscular Once  . docusate sodium  100 mg Oral BID  . insulin aspart  0-20 Units Subcutaneous TID WC  . insulin aspart  0-5 Units Subcutaneous QHS  . insulin regular human CONCENTRATED  150 Units Subcutaneous QAC  breakfast  . insulin regular human CONCENTRATED  70 Units Subcutaneous QAC lunch  . insulin regular human CONCENTRATED  70 Units Subcutaneous Once  . lidocaine  1 patch Transdermal Q24H  . sodium chloride flush  3 mL Intravenous Q12H  . vancomycin variable dose per unstable renal function (pharmacist dosing)   Does not apply See admin instructions   Continuous Infusions: . sodium chloride 100 mL/hr at 12/04/18 0826  . heparin 1,500 Units/hr (12/04/18 0000)  . piperacillin-tazobactam    . piperacillin-tazobactam (ZOSYN)  IV 3.375 g (12/04/18 0548)   PRN Meds:.acetaminophen **OR** acetaminophen, methocarbamol, ondansetron **OR** ondansetron (ZOFRAN) IV, oxyCODONE-acetaminophen  Allergies as of 12/03/2018 - Review Complete 12/03/2018  Allergen Reaction Noted  . Contrast media [iodinated diagnostic agents] Rash and Other (See Comments) 11/07/2013  . Ioxaglate Other (See Comments) 11/07/2013  . Feraheme [ferumoxytol] Other (See Comments) 11/23/2018  . Iodine Other (See Comments) 11/07/2013  . Seasonal ic [cholestatin] Other (See Comments) 03/19/2016  . Adhesive [tape] Rash 11/19/2014    Family History  Problem Relation Age of Onset  . Pneumonia Mother   .  Heart disease Other        No family history    Social History   Socioeconomic History  . Marital status: Married    Spouse name: Not on file  . Number of children: Not on file  . Years of education: Not on file  . Highest education level: Not on file  Occupational History    Comment: Retired from [employer]  . Financial resource strain: Not on file  . Food insecurity:    Worry: Not on file    Inability: Not on file  . Transportation needs:    Medical: Not on file    Non-medical: Not on file  Tobacco Use  . Smoking status: Never Smoker  . Smokeless tobacco: Never Used  Substance and Sexual Activity  . Alcohol use: Yes    Alcohol/week: 0.0 standard drinks    Comment: once drink a month  . Drug use: No  . Sexual activity: Not on file  Lifestyle  . Physical activity:    Days per week: Not on file    Minutes per session: Not on file  . Stress: Not on file  Relationships  . Social connections:    Talks on phone: Not on file    Gets together: Not on file    Attends religious service: Not on file    Active member of club or organization: Not on file    Attends meetings of clubs or organizations: Not on file    Relationship status: Not on file  . Intimate partner violence:    Fear of current or ex partner: Not on file    Emotionally abused: Not on file    Physically abused: Not on file    Forced sexual activity: Not on file  Other Topics Concern  . Not on file  Social History Narrative   Lives at home with wife.  Was in the [facility].     Review of Systems: Review of Systems  Constitutional: Positive for malaise/fatigue. Negative for chills and fever.  HENT: Negative for hearing loss and tinnitus.   Eyes: Negative for blurred vision and double vision.  Respiratory: Negative for cough, hemoptysis and sputum production.   Cardiovascular: Negative for chest pain and palpitations.  Gastrointestinal: Positive for abdominal pain, constipation and  nausea. Negative for blood in stool, melena and vomiting.  Genitourinary: Negative for dysuria  and urgency.  Musculoskeletal: Positive for back pain and joint pain.  Skin: Negative for itching and rash.  Neurological: Negative for seizures and loss of consciousness.  Endo/Heme/Allergies: Does not bruise/bleed easily.  Psychiatric/Behavioral: Negative for substance abuse. The patient is nervous/anxious.     Physical Exam: Vital signs: Vitals:   12/03/18 1812 12/04/18 0652  BP: 117/62 (!) 94/44  Pulse: 63 66  Resp:  20  Temp: 97.6 F (36.4 C) 97.6 F (36.4 C)  SpO2: 100% 100%   Last BM Date: 12/03/18 General:   Alert,  Well-developed, well-nourished, pleasant and cooperative in NAD HEENT : NS, AT , EOMI.  Sclera -nonicteric Lungs:  Clear throughout to auscultation.   No wheezes, crackles, or rhonchi. No acute distress. Heart:  Regular rate and rhythm; no murmurs, clicks, rubs,  or gallops. Abdomen: Abdomen mild distended, right upper quadrant and epigastric tenderness to palpation noted, bowel sounds present.  No peritoneal signs LE : Right lower extremity Ace wrap noted.  No edema over left lower extremity. Psych : Mood and affect normal Neuro : A/O X 3  Rectal:  Deferred  GI:  Lab Results: Recent Labs    12/03/18 1417 12/04/18 0416  WBC 8.3 6.8  HGB 11.7* 10.9*  HCT 37.5* 33.3*  PLT 115* 88*   BMET Recent Labs    12/03/18 1417 12/03/18 1833 12/04/18 0416  NA 125* 125* 131*  K 6.2* 5.4* 5.4*  CL 95* 96* 103  CO2 19* 21* 20*  GLUCOSE 342* 248* 205*  BUN 58* 58* 59*  CREATININE 2.78* 2.60* 2.36*  CALCIUM 8.8* 8.8* 8.8*   LFT Recent Labs    12/03/18 1417  PROT 6.5  ALBUMIN 3.1*  AST 85*  ALT 94*  ALKPHOS 151*  BILITOT 1.0   PT/INR No results for input(s): LABPROT, INR in the last 72 hours.   Studies/Results: Dg Chest Port 1 View  Result Date: 12/03/2018 CLINICAL DATA:  Near syncope. EXAM: PORTABLE CHEST 1 VIEW COMPARISON:  September 07, 2018  FINDINGS: Stable AICD device. Cardiomegaly, unchanged. The hila, mediastinum, lungs, and pleura are unremarkable. A transcutaneous pacer lead overlies the upper medial left chest. IMPRESSION: No acute interval change. Electronically Signed   By: Dorise Bullion III M.D   On: 12/03/2018 14:51    Impression/Plan: -Recently diagnosed hepatocellular carcinoma in setting of underlying cirrhosis.  Probably metastatic.  Significantly elevated alpha-fetoprotein which was checked in the office recently. -Acute kidney injury.  Would be from recent IV contrast. ?? HRS -abnormal LFTs. -Chronic anemia and thrombocytopenia.  Recommendations ------------------------ - D/W Dr. Paulita Fujita.  No need for liver biopsy from GI standpoint as CT scan is diagnostic for definitive hepatocellular carcinoma. -Start IV albumin for elevated kidney functions -Check CMP in the morning. - GI will follow   LOS: 1 day   Otis Brace  MD, FACP 12/04/2018, 10:46 AM  Contact #  [phone]

## 2018-12-04 NOTE — Progress Notes (Signed)
D/w Dr. Lucianne Lei dam-will change to cefazolin 1 g tid for ~ 3-4 days and then switch dosing to keflex Rx doses for another 6 days Then considering suppressive therapy 500 bid ongoing given presence of ppm

## 2018-12-04 NOTE — Progress Notes (Signed)
Spoke with RN and at this time the patient no longer needs a third IV, Vancomycin ABT has been discontinued

## 2018-12-04 NOTE — Telephone Encounter (Signed)
Patient no showed today's appt. Please advise on how to follow up.  A. No follow up necessary. B. Follow up urgent. Contact patient immediately. C. Follow up necessary. Contact patient and schedule visit in ___ days. D. Follow up advised. Contact patient and schedule visit in ____weeks.  Would you like the NS fee to be applied to this visit?

## 2018-12-04 NOTE — Progress Notes (Signed)
[facility] PROGRESS NOTE  Robert Gregory LZJ:[id number] DOB: 07-20-1941 DOA: 12/03/2018 PCP: Seward Carol, MD   Narrative: 86 CM CAD s/p CABG 1997 sys HF with improvement to EF 55-60% 09/08/2018--basleine classii symptoms Afib [place] > 4 on Xarelto-s/p st judePPM 2015 stg iii ckd OSA Iron def anemia Dm ty ii with neuropathy  He was hosptalize din Dec 2019 for [facility] and Cellulitis He came to the ED 2/25 for syncope and PPm was interrogated and was sent home  In the interim has been diagnosed with [place] 8 x 6 cm mass  Came to Ed finally 3.16 with CHB and PPM interrogated by Dr. Kleain-Cardiology following   A & Plan Recurrent right lower extremity cellulitis Seems purulent and usually this is either strep or staph would continue vancomycin now off of Zosyn and add ceftriaxone I will discuss with ID later on today treatment but do not think this would change planning as this is the area where it patient had vein harvest for prior CABG and usually this is indolent T7 back fracture In a setting of current infection-very unlikely would benefit from kyphoplasty or intervention for operative management of the fracture-we will apply TLSO brace this will help with walking distance and we will control pain with opiates-currently on Percocet 1 tab every 8 as needed Complete heart block on admission Pacemaker interrogated defer to cardiology further management Probable AKI Baseline creatinine 1.3 range Have limited multiple medications such as Lasix Aldactone Benicar as below Hopeful for recovery Keep volumes even do not give IV fluid at this time Prior ICM with improved EF Continue meds Coreg 6.25 twice daily-holding Aldactone 25, Benicar 40 daily, Lasix 40 daily Hyperlipidemia Holding TriCor as well as Pravachol Newly diagnosed probable hepatocellular CA Will need outpatient management and reassessment by GI and oncology-following up on labs ordered by oncology today Diabetes  mellitus type 2 with neuropathy Continue 500 150 every morning breakfast, 70 afternoon and 70 units p.m.-we will continue the same regimen with tweaking according to how he takes it at home he is refusing sliding scale in the hospital Atrial fibrillation chads score >4 -continue Coreg as above, currently on IV heparin, will switch back to oral anticoagulation shortly OSA Continue CPAP at night BMI 41 Monitor  On IV heparin Discussed with wife at the bedside Inpatient Dispel pending input from therapy after placing brace may need rehab  [name], MD  [facility] Via amion app OR [email or web address] 7PM-7AM contact night coverage as above 12/04/2018, 10:58 AM  LOS: 1 day   Consultants:  Cardiology  Gastroenterology  Oncology  Procedures:  multiple  Antimicrobials:   iv vacn and ceftriax  Interval history/Subjective: Awake alert has been moving around but 7/10 pain reasonably treatable with opioids No nausea no vomiting No fever No chills Left lower extremity examined and has labs Eating drinking minimally  Objective:  Vitals:  Vitals:   12/03/18 1812 12/04/18 0652  BP: 117/62 (!) 94/44  Pulse: 63 66  Resp:  20  Temp: 97.6 F (36.4 C) 97.6 F (36.4 C)  SpO2: 100% 100%    Exam:  EOMI NCAT thick neck no rales no rhonchi Abdomen soft obese nontender no rebound no guarding no HSM Left lower extremity as below Neurologically intact Finger-nose-finger intact Power 5/5    I have personally reviewed the following:  DATA   Labs:  Sodium 131 potassium 5.4 BUN/creatinine 59/2.3 baseline in the 1.3 range  Hemoglobin 10.9 platelet 88   Scheduled Meds: . carvedilol  6.25  mg Oral BID WC  . cyanocobalamin  1,000 mcg Intramuscular Once  . docusate sodium  100 mg Oral BID  . insulin aspart  0-20 Units Subcutaneous TID WC  . insulin aspart  0-5 Units Subcutaneous QHS  . insulin regular human CONCENTRATED  150 Units Subcutaneous QAC breakfast  . insulin  regular human CONCENTRATED  70 Units Subcutaneous QAC lunch  . insulin regular human CONCENTRATED  70 Units Subcutaneous Once  . lidocaine  1 patch Transdermal Q24H  . sodium chloride flush  3 mL Intravenous Q12H  . vancomycin variable dose per unstable renal function (pharmacist dosing)   Does not apply See admin instructions   Continuous Infusions: . sodium chloride 100 mL/hr at 12/04/18 0826  . heparin 1,500 Units/hr (12/04/18 0000)  . piperacillin-tazobactam    . piperacillin-tazobactam (ZOSYN)  IV 3.375 g (12/04/18 0548)    Principal Problem:   Near syncope Active Problems:   Essential hypertension   Hyperlipidemia   Type 2 diabetes mellitus with stage 3 chronic kidney disease (HCC)   Chronic back pain   Obesity, Class III, BMI 40-49.9 (morbid obesity) (HCC)   Cellulitis of right lower extremity   Complete heart block (HCC)   Acute renal failure superimposed on stage 3 chronic kidney disease ([place])   Hepatocellular carcinoma ([place])   LOS: 1 day

## 2018-12-04 NOTE — Progress Notes (Addendum)
Pt's blood sugar was 69. Pt given Glucerna by request. Blood sugar was 77 when rechecked. Pt was then given orange juice. Blood sugar was then 96. Will continue to monitor.

## 2018-12-04 NOTE — Progress Notes (Signed)
[place] for heparin  Indication: atrial fibrillation  Allergies  Allergen Reactions  . Contrast Media [Iodinated Diagnostic Agents] Rash and Other (See Comments)    Extreme flushing  . Ioxaglate Other (See Comments)    Extreme flushing  . Feraheme [Ferumoxytol] Other (See Comments)    Patient had  reaction during infusion in Feb 2020 decision made to change to [facility] (passed out)  . Iodine Other (See Comments)    flushing  . Seasonal Ic [Cholestatin] Other (See Comments)    Unknown reaction  . Adhesive [Tape] Rash    Adhesive tapes causes redness/itching/rash after removal    Patient Measurements: Height: 6' 1"  (185.4 cm) Weight: (!) 305 lb 14.4 oz (138.8 kg) IBW/kg (Calculated) : 79.9 Heparin Dosing Weight: 111kg  Vital Signs: Temp: 97.6 F (36.4 C) (03/15 1812) Temp Source: Oral (03/15 1812) BP: 117/62 (03/15 1812) Pulse Rate: 63 (03/15 1812)  Labs: Recent Labs    12/03/18 1417 12/03/18 1833 12/03/18 2230 12/04/18 0416  HGB 11.7*  --   --  10.9*  HCT 37.5*  --   --  33.3*  PLT 115*  --   --  88*  APTT  --   --   --  70*  HEPARINUNFRC  --   --   --  0.50  CREATININE 2.78* 2.60*  --   --   TROPONINI  --   --  0.06*  --     Estimated Creatinine Clearance: 34.8 mL/min (A) (by C-G formula based on SCr of 2.6 mg/dL (H)).  Assessment: 78 yo male with history of afib, Xarelto on hold, for heparin.  Goal of Therapy:  Heparin level 0.3-0.7 units/ml aPTT 66-102 seconds Monitor platelets by anticoagulation protocol: Yes   Plan:  Continue Heparin at current rate  Phillis Knack, PharmD, BCPS

## 2018-12-04 NOTE — Progress Notes (Signed)
Orthopedic Tech Progress Note Patient Details:  Robert Gregory [birth year] [id number]  Patient ID: Robert Gregory, male   DOB: 12-24-1940, 78 y.o.   MRN: [id number]   Robert Gregory 12/04/2018, 12:08 PMCalled [employer] for TLSO brace.

## 2018-12-04 NOTE — Consult Note (Signed)
Referral Gregory  Reason for Referral: Metastatic hepatocellular carcinoma with severe back pain secondary to T7 compression fracture  No chief complaint on file. : I passed out.  HPI: [name] is well-known to me.  He is a 78 year old white male who I seen in the past for thrombocytopenia.  He also has iron deficiency anemia and pernicious anemia.  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  He has longstanding cirrhosis secondary to hepatic steatosis.  His longstanding diabetes.  He has a Investment banker, corporate in place.  He apparently came in on Friday with a heart rate of 30 and a blood pressure of systolic of 88.  Apparently, his pacemaker was not working properly.  Cardiology, as always doing a fantastic job, helped with this and got the pacemaker working better.  The bigger issue however is the fact that he in all likelihood has metastatic hepatocellular carcinoma.  He had a CT scan done by Dr. Paulita Gregory last week.  This showed an 8 x 6 right liver dome mass.  This was compatible with a hepatocellular carcinoma.  He had a 3.5 cm left adrenal nodule.  He had a 1.1 cm right pericardiophrenic lymph node.  No alpha-fetoprotein has been done yet.  He has a compression fracture at T7.  He is seeing an orthopedic surgeon about this.  The orthopedic surgeon did a CT scan which confirmed the fracture.  I really believe that he needs have a vertebroplasty.  At this point, quality of life is what this is all about.  He has multiple health issues.  He has a marginal performance status.  He is morbidly obese.  He has responded to the B12 and IV iron infusions.  His appetite is down.  He is on long-term anticoagulation.  He is currently on heparin infusion.  He has had no diarrhea.  Is been no bleeding.  I would say that his performance status is probably ECOG 3.   Past Medical History:  Diagnosis Date  . Arthritis   . Atrial fibrillation ([place])    takes Xarelto  daily  . Cellulitis    RLE  . Cholelithiasis   . Chronic back pain    scoliosis/arthritis  . Complication of anesthesia    forgetful for about 2-3wks after anesthesia  . Hepatocellular carcinoma ([place])   . History of colon polyps   . Hyperlipidemia    takes [employer] daily  . Hypertension    takes Benicar and Coreg daily  . ICD (implantable cardioverter-defibrillator) in place 11/07/2013  . Iron deficiency anemia due to chronic blood loss 08/24/2018  . Iron malabsorption 08/24/2018  . Ischemic cardiomyopathy   . Myocardial infarction ([place]) 1997   on the OR table   . Nephrolithiasis   . Obstructive sleep apnea    CPAP  . Peripheral neuropathy   . Pernicious anemia 08/24/2018  . Spinal stenosis   . Thrombocytopenia ([place])   . Total knee replacement status 11/29/2014  . Type 2 diabetes mellitus with stage 3 chronic kidney disease ([place]) 05/19/2015  :  Past Surgical History:  Procedure Laterality Date  . CARDIAC CATHETERIZATION N/A 10/02/2015   Procedure: Left Heart Cath and Cors/Grafts Angiography;  Surgeon: Robert Crome, Gregory;  Location: [facility];  Service: Cardiovascular;  Laterality: N/A;  . COLONOSCOPY    . coronary artery bypass and graft  1997   x 5  . ESOPHAGOGASTRODUODENOSCOPY    . ICD placed    .  LAMINECTOMY    . LITHOTRIPSY    . Right rotator cuff surgery    . TONSILLECTOMY     adenoids  . TOTAL KNEE ARTHROPLASTY Left   . TOTAL KNEE ARTHROPLASTY Right 11/29/2014   Procedure: RIGHT TOTAL KNEE ARTHROPLASTY;  Surgeon: Robert Koyanagi, Gregory;  Location: [facility];  Service: Orthopedics;  Laterality: Right;  :   Current Facility-Administered Medications:  .  0.9 %  sodium chloride infusion, , Intravenous, Continuous, Robert Bongo, Gregory, Last Rate: 100 mL/hr at 12/04/18 0000 .  acetaminophen (TYLENOL) tablet 650 mg, 650 mg, Oral, Q6H PRN **OR** acetaminophen (TYLENOL) suppository 650 mg, 650 mg, Rectal, Q6H PRN, Robert Bongo, Gregory .  carvedilol (COREG)  tablet 6.25 mg, 6.25 mg, Oral, BID WC, Robert Bongo, Gregory, 6.25 mg at 12/03/18 1901 .  docusate sodium (COLACE) capsule 100 mg, 100 mg, Oral, BID, Robert Bongo, Gregory .  heparin ADULT infusion 100 units/mL (25000 units/261m sodium chloride 0.45%), 1,500 Units/hr, Intravenous, Continuous, Robert Gregory Last Rate: 15 mL/hr at 12/04/18 0000, 1,500 Units/hr at 12/04/18 0000 .  insulin aspart (novoLOG) injection 0-20 Units, 0-20 Units, Subcutaneous, TID WC, Robert Gregory .  insulin aspart (novoLOG) injection 0-5 Units, 0-5 Units, Subcutaneous, QHS, Robert Gregory, 2 Units at 12/03/18 2216 .  insulin regular human CONCENTRATED (HUMULIN R) 500 UNIT/ML kwikpen 150 Units, 150 Units, Subcutaneous, QAC breakfast, Robert Gregory .  insulin regular human CONCENTRATED (HUMULIN R) 500 UNIT/ML kwikpen 70 Units, 70 Units, Subcutaneous, QAC lunch, Robert Gregory .  insulin regular human CONCENTRATED (HUMULIN R) 500 UNIT/ML kwikpen 70 Units, 70 Units, Subcutaneous, Once, Robert Gregory .  lidocaine (LIDODERM) 5 % 1 patch, 1 patch, Transdermal, Q24H, Robert Gregory, 1 patch at 12/03/18 1850 .  methocarbamol (ROBAXIN) tablet 375 mg, 375 mg, Oral, Q8H PRN, Robert Gregory, 375 mg at 12/03/18 2214 .  ondansetron (ZOFRAN) tablet 4 mg, 4 mg, Oral, Q6H PRN **OR** ondansetron (ZOFRAN) injection 4 mg, 4 mg, Intravenous, Q6H PRN, Robert Gregory .  oxyCODONE-acetaminophen (PERCOCET) 7.5-325 MG per tablet 1 tablet, 1 tablet, Oral, Q8H PRN, Robert Gregory, 0.5 tablet at 12/04/18 0111 .  piperacillin-tazobactam (ZOSYN) IVPB 3.375 g, 3.375 g, Intravenous, Once, Robert Gregory .  piperacillin-tazobactam (ZOSYN) IVPB 3.375 g, 3.375 g, Intravenous, Q8H, Robert Gregory, Last Rate: 12.5 mL/hr at 12/04/18 0548, 3.375 g at 12/04/18 0548 .  sodium chloride flush (NS) 0.9 % injection 3 mL, 3 mL, Intravenous, Q12H, Robert Gregory, 3 mL at 12/03/18 2245 .  vancomycin  variable dose per unstable renal function (pharmacist dosing), , Does not apply, See admin instructions, Robert Gregory:  . carvedilol  6.25 mg Oral BID WC  . docusate sodium  100 mg Oral BID  . insulin aspart  0-20 Units Subcutaneous TID WC  . insulin aspart  0-5 Units Subcutaneous QHS  . insulin regular human CONCENTRATED  150 Units Subcutaneous QAC breakfast  . insulin regular human CONCENTRATED  70 Units Subcutaneous QAC lunch  . insulin regular human CONCENTRATED  70 Units Subcutaneous Once  . lidocaine  1 patch Transdermal Q24H  . sodium chloride flush  3 mL Intravenous Q12H  . vancomycin variable dose per unstable renal function (pharmacist dosing)   Does not apply See admin instructions  :  Allergies  Allergen Reactions  . Contrast Media [Iodinated Diagnostic Agents] Rash and Other (See Comments)    Extreme flushing  . Ioxaglate Other (See Comments)  Extreme flushing  . Feraheme [Ferumoxytol] Other (See Comments)    Patient had  reaction during infusion in Feb 2020 decision made to change to [facility] (passed out)  . Iodine Other (See Comments)    flushing  . Seasonal Ic [Cholestatin] Other (See Comments)    Unknown reaction  . Adhesive [Tape] Rash    Adhesive tapes causes redness/itching/rash after removal  :  Family History  Problem Relation Age of Onset  . Pneumonia Mother   . Heart disease Other        No family history  :  Social History   Socioeconomic History  . Marital status: Married    Spouse name: Not on file  . Number of children: Not on file  . Years of education: Not on file  . Highest education level: Not on file  Occupational History    Comment: Retired from [employer]  . Financial resource strain: Not on file  . Food insecurity:    Worry: Not on file    Inability: Not on file  . Transportation needs:    Medical: Not on file    Non-medical: Not on file  Tobacco Use  . Smoking status: Never Smoker  . Smokeless  tobacco: Never Used  Substance and Sexual Activity  . Alcohol use: Yes    Alcohol/week: 0.0 standard drinks    Comment: once drink a month  . Drug use: No  . Sexual activity: Not on file  Lifestyle  . Physical activity:    Days per week: Not on file    Minutes per session: Not on file  . Stress: Not on file  Relationships  . Social connections:    Talks on phone: Not on file    Gets together: Not on file    Attends religious service: Not on file    Active member of club or organization: Not on file    Attends meetings of clubs or organizations: Not on file    Relationship status: Not on file  . Intimate partner violence:    Fear of current or ex partner: Not on file    Emotionally abused: Not on file    Physically abused: Not on file    Forced sexual activity: Not on file  Other Topics Concern  . Not on file  Social History Narrative   Lives at home with wife.  Was in the [facility].   :  Review of Systems  Constitutional: Positive for malaise/fatigue.  HENT: Negative.   Respiratory: Positive for shortness of breath.   Cardiovascular: Positive for palpitations and leg swelling.  Gastrointestinal: Positive for nausea.  Genitourinary: Negative.   Musculoskeletal: Positive for back pain.  Skin: Negative.   Neurological: Negative.   Endo/Heme/Allergies: Negative.   Psychiatric/Behavioral: Negative.      Exam: Obese white male in no obvious distress.  Vital signs are temperature 97.6.  Pulse 66.  Blood pressure 94/44.  Head neck exam shows no scleral icterus.  He has no obvious adenopathy in the neck.  There is no oral thrush.  Lungs are clear bilaterally.  Cardiac exam regular rate and rhythm with no murmurs, rubs or bruits.  Abdomen is soft.  He is obese.  There is some tenderness in the upper abdomen.  I really cannot palpate his liver.  There is no obvious fluid wave.  Extremities shows a dressing around his right lower leg ankle.  He has bilateral swelling in his legs.   Neurological exam is  nonfocal.   Patient Vitals for the past 24 hrs:  BP Temp Temp src Pulse Resp SpO2 Height Weight  12/04/18 0652 (!) 94/44 97.6 F (36.4 C) Oral 66 20 100 % - -  12/04/18 0600 - - - - - - - (!) 309 lb 11.9 oz (140.5 kg)  12/03/18 1812 117/62 97.6 F (36.4 C) Oral 63 - 100 % 6' 1"  (1.854 Robert) (!) 305 lb 14.4 oz (138.8 kg)  12/03/18 1700 (!) 101/58 - - 64 20 98 % - -  12/03/18 1645 (!) 100/58 - - 64 18 99 % - -  12/03/18 1630 (!) 103/53 - - 63 17 98 % - -  12/03/18 1615 (!) 100/41 - - 65 19 99 % - -  12/03/18 1600 (!) 105/50 - - 63 17 100 % - -  12/03/18 1545 (!) 95/42 - - 65 19 98 % - -  12/03/18 1540 - - - 62 18 100 % - -  12/03/18 1530 (!) 100/45 - - 64 20 97 % - -  12/03/18 1500 - - - 64 19 97 % - -  12/03/18 1445 (!) 96/42 - - 64 18 98 % - -  12/03/18 1430 106/85 - - (!) 30 17 99 % - -  12/03/18 1417 - - - (!) 32 16 98 % - -  12/03/18 1416 (!) 94/40 - - (!) 49 18 95 % - -  12/03/18 1413 - - - - - - 6' 1"  (1.854 Robert) (!) 314 lb 6 oz (142.6 kg)     Recent Labs    12/03/18 1417 12/04/18 0416  WBC 8.3 6.8  HGB 11.7* 10.9*  HCT 37.5* 33.3*  PLT 115* 88*   Recent Labs    12/03/18 1833 12/04/18 0416  NA 125* 131*  K 5.4* 5.4*  CL 96* 103  CO2 21* 20*  GLUCOSE 248* 205*  BUN 58* 59*  CREATININE 2.60* 2.36*  CALCIUM 8.8* 8.8*    Blood smear review: None  Pathology: None    Assessment and Plan: [name] is a 78 year old white male.  He has metastatic liver cancer.  Disseminated from cirrhosis secondary to hepatic steatosis.  Again, the goal here clearly is quality of life.  That is what he wants.  He is a DNR which I agree with.  He has multiple other health issues.  We have to get the T7 compression fracture fixed.  This is our top priority.  We will see if interventional radiology can do a vertebroplasty on this to help give him relief.  I do not think that the liver mass needs to be biopsied.  We will see what his alpha-fetoprotein is.  I  talked to he and his wife this morning.  They understand what is going on.  They want him to be in comfort.  They want him to be more mobile.  I think 1 way this will happen as if the T7 fracture will be fixed.  If this is a pathologic fracture, he probably needs some radiation afterwards.  We will follow along and help out any way that we can.  I know that he will get outstanding care from all the staff up on 6 E.  Lattie Haw, Gregory  Oswaldo Milian 41:10

## 2018-12-04 NOTE — Telephone Encounter (Signed)
3-4 mo

## 2018-12-04 NOTE — Progress Notes (Addendum)
Progress Note  Patient Name: Robert Gregory Date of Encounter: 12/04/2018  Primary Cardiologist: Cristopher Peru, MD   Subjective   Patient is lying in bed on his right side.  He complains of generalized muscle aching of the upper body as well as back pain that limits his mobility in and out of the bed.  He did get up to the sink to bathe this morning with no lightheadedness and no chest pain.  He has shortness of breath that he relates to his pain.  Inpatient Medications    Scheduled Meds: . carvedilol  6.25 mg Oral BID WC  . cyanocobalamin  1,000 mcg Intramuscular Once  . docusate sodium  100 mg Oral BID  . insulin aspart  0-20 Units Subcutaneous TID WC  . insulin aspart  0-5 Units Subcutaneous QHS  . insulin regular human CONCENTRATED  150 Units Subcutaneous QAC breakfast  . insulin regular human CONCENTRATED  70 Units Subcutaneous QAC lunch  . insulin regular human CONCENTRATED  70 Units Subcutaneous Once  . lidocaine  1 patch Transdermal Q24H  . sodium chloride flush  3 mL Intravenous Q12H  . vancomycin variable dose per unstable renal function (pharmacist dosing)   Does not apply See admin instructions   Continuous Infusions: . sodium chloride 100 mL/hr at 12/04/18 0000  . heparin 1,500 Units/hr (12/04/18 0000)  . piperacillin-tazobactam    . piperacillin-tazobactam (ZOSYN)  IV 3.375 g (12/04/18 0548)   PRN Meds: acetaminophen **OR** acetaminophen, methocarbamol, ondansetron **OR** ondansetron (ZOFRAN) IV, oxyCODONE-acetaminophen   Vital Signs    Vitals:   12/03/18 1700 12/03/18 1812 12/04/18 0600 12/04/18 0652  BP: (!) 101/58 117/62  (!) 94/44  Pulse: 64 63  66  Resp: 20   20  Temp:  97.6 F (36.4 C)  97.6 F (36.4 C)  TempSrc:  Oral  Oral  SpO2: 98% 100%  100%  Weight:  (!) 138.8 kg (!) 140.5 kg   Height:  6' 1"  (1.854 m)      Intake/Output Summary (Last 24 hours) at 12/04/2018 0741 Last data filed at 12/04/2018 0100 Gross per 24 hour  Intake 2249.37 ml   Output 675 ml  Net 1574.37 ml   Last 3 Weights 12/04/2018 12/03/2018 12/03/2018  Weight (lbs) 309 lb 11.9 oz 305 lb 14.4 oz 314 lb 6 oz  Weight (kg) 140.5 kg 138.755 kg 142.6 kg      Telemetry    Ventricular pacing, 72 bpm- Personally Reviewed  ECG    No new tracings- Personally Reviewed  Physical Exam   GEN: No acute distress.   Neck: No JVD Cardiac: RRR, no murmurs, rubs, or gallops.  Respiratory: Clear to auscultation bilaterally. GI: Soft, nontender, non-distended  MS: No edema; right lower leg wrapped with dressing and Ace wrap. Neuro:  Nonfocal  Psych: Normal affect   Labs    Chemistry Recent Labs  Lab 12/03/18 1417 12/03/18 1833 12/04/18 0416  NA 125* 125* 131*  K 6.2* 5.4* 5.4*  CL 95* 96* 103  CO2 19* 21* 20*  GLUCOSE 342* 248* 205*  BUN 58* 58* 59*  CREATININE 2.78* 2.60* 2.36*  CALCIUM 8.8* 8.8* 8.8*  PROT 6.5  --   --   ALBUMIN 3.1*  --   --   AST 85*  --   --   ALT 94*  --   --   ALKPHOS 151*  --   --   BILITOT 1.0  --   --   GFRNONAA 21* 23*  26*  GFRAA 24* 26* 30*  ANIONGAP 11 8 8      Hematology Recent Labs  Lab 12/03/18 1417 12/04/18 0416  WBC 8.3 6.8  RBC 3.98* 3.60*  HGB 11.7* 10.9*  HCT 37.5* 33.3*  MCV 94.2 92.5  MCH 29.4 30.3  MCHC 31.2 32.7  RDW 18.7* 18.5*  PLT 115* 88*    Cardiac Enzymes Recent Labs  Lab 12/03/18 2230 12/04/18 0416  TROPONINI 0.06* 0.06*    Recent Labs  Lab 12/03/18 1427  TROPIPOC 0.03     BNPNo results for input(s): BNP, PROBNP in the last 168 hours.   DDimer No results for input(s): DDIMER in the last 168 hours.   Radiology    Dg Chest Port 1 View  Result Date: 12/03/2018 CLINICAL DATA:  Near syncope. EXAM: PORTABLE CHEST 1 VIEW COMPARISON:  September 07, 2018 FINDINGS: Stable AICD device. Cardiomegaly, unchanged. The hila, mediastinum, lungs, and pleura are unremarkable. A transcutaneous pacer lead overlies the upper medial left chest. IMPRESSION: No acute interval change. Electronically  Signed   By: Dorise Bullion III M.D   On: 12/03/2018 14:51    Cardiac Studies   Echocardiogram 09/08/2018 Study Conclusions - Left ventricle: The cavity size was normal. Wall thickness was   normal. Systolic function was normal. The estimated ejection   fraction was in the range of 55% to 60%. Wall motion was normal;   there were no regional wall motion abnormalities. The study is   not technically sufficient to allow evaluation of LV diastolic   function.  Impressions: - Extremely limited; definity used; overall normal LV function;   cannot R/O wall motion abnormality.   Patient Profile     78 y.o. male with hx of CAD s/p CABG, Chronic systolic CHF with prior EF 38%-normalized in 12/19, ICD, atrial fibrillation. Admitted with near syncope and found to be in CHB. Device reprogrammed by Dr. Caryl Comes  Assessment & Plan    Near syncope -Pt found to be in CHB with HR in the 30's. Pacemaker reprogrammed with improvement in his lightheadedness.  Heart rhythm is currently ventricular pacing at 72 bpm. -Patient feels better today.  Was able to stand up and bathe at the sink with no lightheadedness or near syncope. -No changes in cardiac therapy at this time.  PAF -Rate controlled on carvedilol. Currently ventricular pacing at 72 bpm.  -Pt anticoagulated with Xarelto at home for stroke risk reduction.  Now off Xarelto and on IV heparin.  Patient has a pathologic fracture of the T7 and may be considered for vertebroplasty.  History of systolic heart failure -Patient continues on carvedilol. Benicar, spironolactone and Lasix are currently on hold.  He is receiving IV fluids for acute renal injury.  Currently he appears euvolemic.  Will need to watch fluid status carefully and restart diuretic as needed.  CAD -Troponins have been mildly elevated at 0.06, flat pattern.  Likely due to demand ischemia in setting of hypotension and bradycardia on presentation. -No chest pain.  Hypertension  -Blood pressures are currently soft with systolic blood pressure 09F-GHW 100s.  Patient continues on carvedilol, however Benicar and Lasix are on hold.  Metastatic liver cancer -Patient was evaluated by [name] yesterday and felt to have metastatic liver cancer disseminated from cirrhosis secondary to hepatic steatosis.  He identifies quality of life as the goal of care. -Patient reports generalized upper body muscular aches that he relates to liver cancer along with decreased activity tolerance.  Acute on chronic kidney disease -Possibly  related to hypoperfusion in the setting of significant bradycardia and hypotension.  Patient is being gently hydrated.  Avoiding nephrotoxic agents.  His Benicar, spironolactone and Lasix are on hold. -Creatinine is slowly trending down 2.78> 2.60> 2.36  Dyslipidemia -Lipid therapy currently on hold due to hepatic cancer.  Right lower extremity cellulitis -Patient with refractory cellulitis despite multiple rounds of antibiotics.  Currently on IV vancomycin and Zosyn.  Internal medicine is following and will consult infectious disease if indicated today. -Lipid therapies are currently on hold  For questions or updates, please contact [place] Please consult [email or web address] for contact info under        Signed, Daune Perch, NP  12/04/2018, 7:41 AM    I have seen and examined the patient along with Daune Perch, NP .  I have reviewed the chart, notes and new data.  I agree with NP's note.  Key new complaints: no CV complaints Key examination changes: split S2, otw normal CV exam Key new findings / data: improving renal parameters; very mild and "flat" troponin elevation.  PLAN: [facility] will sign off.   Medication Recommendations:  No changes Other recommendations (labs, testing, etc):  n/a Follow up as an outpatient:  Scheduled for remote device download 02/21/2019.   Sanda Klein, MD, [place] [phone]  12/04/2018, 11:49 AM

## 2018-12-05 ENCOUNTER — Inpatient Hospital Stay (HOSPITAL_COMMUNITY): Payer: Medicare Other

## 2018-12-05 DIAGNOSIS — C220 Liver cell carcinoma: Secondary | ICD-10-CM | POA: Diagnosis not present

## 2018-12-05 DIAGNOSIS — N179 Acute kidney failure, unspecified: Secondary | ICD-10-CM | POA: Diagnosis not present

## 2018-12-05 DIAGNOSIS — K7460 Unspecified cirrhosis of liver: Secondary | ICD-10-CM | POA: Diagnosis not present

## 2018-12-05 LAB — CBC WITH DIFFERENTIAL/PLATELET
Abs Immature Granulocytes: 0.02 10*3/uL (ref 0.00–0.07)
BASOS PCT: 0 %
Basophils Absolute: 0 10*3/uL (ref 0.0–0.1)
Eosinophils Absolute: 0.1 10*3/uL (ref 0.0–0.5)
Eosinophils Relative: 2 %
HCT: 30.7 % — ABNORMAL LOW (ref 39.0–52.0)
Hemoglobin: 9.8 g/dL — ABNORMAL LOW (ref 13.0–17.0)
Immature Granulocytes: 0 %
Lymphocytes Relative: 10 %
Lymphs Abs: 0.5 10*3/uL — ABNORMAL LOW (ref 0.7–4.0)
MCH: 29.5 pg (ref 26.0–34.0)
MCHC: 31.9 g/dL (ref 30.0–36.0)
MCV: 92.5 fL (ref 80.0–100.0)
Monocytes Absolute: 0.8 10*3/uL (ref 0.1–1.0)
Monocytes Relative: 18 %
NEUTROS PCT: 70 %
Neutro Abs: 3.2 10*3/uL (ref 1.7–7.7)
Platelets: 66 10*3/uL — ABNORMAL LOW (ref 150–400)
RBC: 3.32 MIL/uL — ABNORMAL LOW (ref 4.22–5.81)
RDW: 18.3 % — AB (ref 11.5–15.5)
WBC: 4.7 10*3/uL (ref 4.0–10.5)
nRBC: 0 % (ref 0.0–0.2)

## 2018-12-05 LAB — GLUCOSE, CAPILLARY
GLUCOSE-CAPILLARY: 58 mg/dL — AB (ref 70–99)
Glucose-Capillary: 115 mg/dL — ABNORMAL HIGH (ref 70–99)
Glucose-Capillary: 131 mg/dL — ABNORMAL HIGH (ref 70–99)
Glucose-Capillary: 96 mg/dL (ref 70–99)
Glucose-Capillary: 97 mg/dL (ref 70–99)
Glucose-Capillary: 202 mg/dL — ABNORMAL HIGH (ref 70–99)
Glucose-Capillary: 185 mg/dL — ABNORMAL HIGH (ref 70–99)

## 2018-12-05 LAB — COMPREHENSIVE METABOLIC PANEL
ALT: 96 U/L — ABNORMAL HIGH (ref 0–44)
ANION GAP: 7 (ref 5–15)
AST: 87 U/L — ABNORMAL HIGH (ref 15–41)
Albumin: 3.4 g/dL — ABNORMAL LOW (ref 3.5–5.0)
Alkaline Phosphatase: 125 U/L (ref 38–126)
BUN: 45 mg/dL — ABNORMAL HIGH (ref 8–23)
CO2: 20 mmol/L — ABNORMAL LOW (ref 22–32)
Calcium: 8.6 mg/dL — ABNORMAL LOW (ref 8.9–10.3)
Chloride: 106 mmol/L (ref 98–111)
Creatinine, Ser: 1.74 mg/dL — ABNORMAL HIGH (ref 0.61–1.24)
GFR calc Af Amer: 43 mL/min — ABNORMAL LOW (ref >60)
GFR, EST NON AFRICAN AMERICAN: 37 mL/min — AB (ref >60)
Glucose, Bld: 76 mg/dL (ref 70–99)
Potassium: 4.9 mmol/L (ref 3.5–5.1)
Sodium: 133 mmol/L — ABNORMAL LOW (ref 135–145)
Total Bilirubin: 1.2 mg/dL (ref 0.3–1.2)
Total Protein: 6.1 g/dL — ABNORMAL LOW (ref 6.5–8.1)

## 2018-12-05 LAB — APTT: aPTT: 120 seconds — ABNORMAL HIGH (ref 24–36)

## 2018-12-05 LAB — HEPARIN LEVEL (UNFRACTIONATED): Heparin Unfractionated: 0.4 IU/mL (ref 0.30–0.70)

## 2018-12-05 LAB — AFP TUMOR MARKER: AFP, SERUM, TUMOR MARKER: 3642 ng/mL — AB (ref 0.0–8.3)

## 2018-12-05 NOTE — Progress Notes (Signed)
PT Cancellation Note  Patient Details Name: Robert Gregory MRN: [id number] DOB: Feb 25, 1941   Cancelled Treatment:    Reason Eval/Treat Not Completed: Active bedrest order Will await increase in activity orders prior to PT evaluation. Will follow.   Marguarite Arbour A Tamala Manzer 12/05/2018, 7:10 AM Wray Kearns, PT, DPT Acute Rehabilitation Services Pager [phone] Office [phone]

## 2018-12-05 NOTE — Progress Notes (Signed)
Inpatient Rehab Admissions:  Inpatient Rehab Consult received.  I met with pt and his wife at the bedside for rehabilitation assessment and to discuss goals and expectations of an inpatient rehab admission.   Pt and family appear interested in CIR but are unsure about his tolerance for therapy due to pain at this time. AC will follow up with pt and his family tomorrow for further discussion and assessment of his tolerance.   Please call if questions.  Jhonnie Garner, OTR/L  Rehab Admissions Coordinator  [phone] 12/05/2018 4:25 PM

## 2018-12-05 NOTE — Telephone Encounter (Signed)
Patient is currently hospitalized.

## 2018-12-05 NOTE — Progress Notes (Signed)
[place] for heparin  Indication: atrial fibrillation  Allergies  Allergen Reactions  . Contrast Media [Iodinated Diagnostic Agents] Rash and Other (See Comments)    Extreme flushing  . Ioxaglate Other (See Comments)    Extreme flushing  . Feraheme [Ferumoxytol] Other (See Comments)    Patient had  reaction during infusion in Feb 2020 decision made to change to [facility] (passed out)  . Iodine Other (See Comments)    flushing  . Seasonal Ic [Cholestatin] Other (See Comments)    Unknown reaction  . Adhesive [Tape] Rash    Adhesive tapes causes redness/itching/rash after removal    Patient Measurements: Height: 6' 1"  (185.4 cm) Weight: (!) 310 lb 12.8 oz (141 kg) IBW/kg (Calculated) : 79.9 Heparin Dosing Weight: 111kg  Vital Signs: Temp: 97.7 F (36.5 C) (03/17 0546) Temp Source: Oral (03/17 0546) BP: 107/49 (03/17 0546) Pulse Rate: 71 (03/17 0546)  Labs: Recent Labs    12/03/18 1417 12/03/18 1833 12/03/18 2230 12/04/18 0416 12/04/18 1103 12/05/18 0335  HGB 11.7*  --   --  10.9*  --  9.8*  HCT 37.5*  --   --  33.3*  --  30.7*  PLT 115*  --   --  88*  --  66*  APTT  --   --   --  70*  --  120*  HEPARINUNFRC  --   --   --  0.50  --  0.40  CREATININE 2.78* 2.60*  --  2.36*  --  1.74*  TROPONINI  --   --  0.06* 0.06* 0.05*  --     Estimated Creatinine Clearance: 52.4 mL/min (A) (by C-G formula based on SCr of 1.74 mg/dL (H)).  Assessment: 78 yo male with history of afib, Xarelto on hold, for heparin.   Heparin level this morning came back therapeutic at 0.4, aPTT supratherapeutic at 120 (previously levels correlated) - will just monitor from heparin levels. Hgb 9.8, plt down to 66 - monitor closely. No s/sx of bleeding. No infusion issues.   Goal of Therapy:  Heparin level 0.3-0.7 units/ml Monitor platelets by anticoagulation protocol: Yes   Plan:  Continue heparin infusion at 1500 units/hr Monitor daily HL, CBC, and for  s/sx of bleeding F/u restart of [place], PharmD, [place] Clinical Pharmacist  Pager: [phone] Phone: [phone]

## 2018-12-05 NOTE — Progress Notes (Signed)
Rehab Admissions Coordinator Note:  Patient was screened by Cleatrice Burke for appropriateness for an Inpatient Acute Rehab Consult per PT recommendation.  At this time, we are recommending await medical work up and plans before requesting an inpt rehab consult. I will follow.Cleatrice Burke RN MSN 12/05/2018, 11:35 AM  I can be reached at [phone].

## 2018-12-05 NOTE — Evaluation (Signed)
Physical Therapy Evaluation Patient Details Name: Robert Gregory MRN: [id number] DOB: 05-29-1941 Today's Date: 12/05/2018   History of Present Illness  Patient is a 78 y/o male who presents with dizziness, bradycardia and near syncope. Pt with complete heart block and metastatic hepatocellular carinoma with T7 compression fx. Possible for verterbroplasty. PMH includes DM, MI, ICD, HTN, HLD, A-fib.  Clinical Impression  Patient presents with back pain, generalized weakness, impaired balance and impaired mobility s/p above. Tolerated bed mobility, transfers and gait training with Min-mod A for balance/safety. Education re: back precautions, TLSO, positioning etc. Pt highly motivated to return to PLOF and participate in therapies despite high pain level. Does better when premedicated. Pt Mod I PTA using RW vs SPC living with spouse. Mild 2/4 DOE noted with mobility. Would benefit from intensive therapies to maximize independence and mobility prior to return home. Will follow acutely.    Follow Up Recommendations CIR;Supervision for mobility/OOB    Equipment Recommendations  None recommended by PT    Recommendations for Other Services       Precautions / Restrictions Precautions Precautions: Fall;Back Precaution Booklet Issued: No Precaution Comments: Reviewed precautions and brace.  Required Braces or Orthoses: Spinal Brace Spinal Brace: Thoracolumbosacral orthotic;Applied in sitting position Restrictions Weight Bearing Restrictions: No      Mobility  Bed Mobility Overal bed mobility: Needs Assistance Bed Mobility: Sidelying to Sit   Sidelying to sit: Mod assist;HOB elevated       General bed mobility comments: Pt already in sidelying; Mod A to elevate trunk to get to EOB. Cues for log roll technique.  Transfers Overall transfer level: Needs assistance Equipment used: Rolling walker (2 wheeled) Transfers: Sit to/from Stand Sit to Stand: Min assist;From elevated surface          General transfer comment: Min A to steady in standing; able to stand wtih elevated bed height and pushing through UEs. Cues for hand placement/technique. Stood from [employer], from chair x2, transferred torecliner.   Ambulation/Gait Ambulation/Gait assistance: Min assist Gait Distance (Feet): 25 Feet(+ 15') Assistive device: Rolling walker (2 wheeled) Gait Pattern/deviations: Step-through pattern;Decreased stride length;Wide base of support;Shuffle Gait velocity: decreased   General Gait Details: Slow, unsteady gait with min A for balance/safety. Pt incontinent of urine standing at sink. HR stable. 2/4 DOE.   Stairs            Wheelchair Mobility    Modified Rankin (Stroke Patients Only)       Balance Overall balance assessment: Needs assistance Sitting-balance support: Feet supported;Bilateral upper extremity supported Sitting balance-Leahy Scale: Fair Sitting balance - Comments: BUE support as position of comfort due to back pain   Standing balance support: During functional activity Standing balance-Leahy Scale: Fair Standing balance comment: Able to stand at sink and brush teeth wtih min guard-Min A.                              Pertinent Vitals/Pain Pain Assessment: 0-10 Pain Score: 9  Pain Location: back radiating into torso and back Pain Descriptors / Indicators: Shooting;Grimacing;Guarding Pain Intervention(s): Premedicated before session;Repositioned;Limited activity within patient's tolerance;Monitored during session    [place] expects to be discharged to:: Private residence Living Arrangements: Spouse/significant other Available Help at Discharge: Family;Available 24 hours/day Type of Home: House Home Access: Level entry     Home Layout: One level Home Equipment: Walker - 2 wheels;Cane - single point;Shower seat - built in;Grab bars - tub/shower;Toilet  riser      Prior Function Level of Independence: Independent  with assistive device(s)         Comments: Doing own dressing/bathing. Wife helps dry feet. Uses RW vs SPC for ambulation.     Hand Dominance   Dominant Hand: Right    Extremity/Trunk Assessment   Upper Extremity Assessment Upper Extremity Assessment: Defer to OT evaluation    Lower Extremity Assessment Lower Extremity Assessment: Generalized weakness(Sensation WFL)       Communication   Communication: No difficulties  Cognition Arousal/Alertness: Awake/alert Behavior During Therapy: WFL for tasks assessed/performed Overall Cognitive Status: Within Functional Limits for tasks assessed                                 General Comments: for basic mobility tasks.      General Comments General comments (skin integrity, edema, etc.): Incontinent of urine- assisted with washing lower half, changing socks etc. Wife present during session.    Exercises     Assessment/Plan    PT Assessment Patient needs continued PT services  PT Problem List Decreased strength;Decreased balance;Decreased knowledge of precautions;Pain;Cardiopulmonary status limiting activity;Obesity;Decreased mobility;Decreased activity tolerance       PT Treatment Interventions Functional mobility training;Balance training;Patient/family education;Therapeutic activities;Therapeutic exercise;Gait training    PT Goals (Current goals can be found in the Care Plan section)  Acute Rehab PT Goals Patient Stated Goal: to get this pain to decrease PT Goal Formulation: With patient/family Time For Goal Achievement: 12/19/18 Potential to Achieve Goals: Good    Frequency Min 3X/week   Barriers to discharge Decreased caregiver support wife is only able to do so much    Co-evaluation               AM-PAC PT "6 Clicks" Mobility  Outcome Measure Help needed turning from your back to your side while in a flat bed without using bedrails?: A Little Help needed moving from lying on your back  to sitting on the side of a flat bed without using bedrails?: A Lot Help needed moving to and from a bed to a chair (including a wheelchair)?: A Little Help needed standing up from a chair using your arms (e.g., wheelchair or bedside chair)?: A Little Help needed to walk in hospital room?: A Little Help needed climbing 3-5 steps with a railing? : A Lot 6 Click Score: 16    End of Session Equipment Utilized During Treatment: Gait belt;Back brace Activity Tolerance: Patient tolerated treatment well;Patient limited by pain Patient left: in chair;with call bell/phone within reach;with chair alarm set;with family/visitor present Nurse Communication: Mobility status PT Visit Diagnosis: Pain;Difficulty in walking, not elsewhere classified (R26.2);Muscle weakness (generalized) (M62.81);Unsteadiness on feet (R26.81) Pain - part of body: (back)    Time: 6948-5462 PT Time Calculation (min) (ACUTE ONLY): 35 min   Charges:   PT Evaluation $PT Eval Moderate Complexity: 1 Mod          Wray Kearns, PT, DPT Acute Rehabilitation Services Pager [phone] Office [phone]      [place] 12/05/2018, 11:23 AM

## 2018-12-05 NOTE — Progress Notes (Addendum)
[facility] PROGRESS NOTE  Robert Gregory TKP:[id number] DOB: [birth year] DOA: 12/03/2018 PCP: Seward Carol, MD   Narrative: 90 CM CAD s/p CABG 1997 sys HF with improvement to EF 55-60% 09/08/2018--basleine classii symptoms Afib [place] > 4 on Xarelto-s/p st judePPM 2015 stg iii ckd OSA [place] def anemia Dm ty ii with neuropathy  He was hosptalize din Dec 2019 for [facility] and Cellulitis He came to the ED 2/25 for syncope and PPm was interrogated and was sent home  In the interim has been diagnosed with [place] 8 x 6 cm mass  Came to Ed finally 3.16 with CHB which was symptomatic and PPM interrogated by Dr. Kleain-Cardiology following   A & Plan Recurrent right lower extremity cellulitis Cont per ID cefazolin 1 g--transition in 48-72 hours to po keflex for rx to complete 3/22  T7 back fracture apply TLSO brace this will help with walking distance and we will control pain with opiates-currently on Percocet 1 tab every 8 as needed  I would defer to IR if safe to do procedure--I have recommended interval procedure if he is still a candidate and discussed briefly with Robert Gregory of yesterday who concurred that with an active infection patient is NOT a good candidate for a procedure Dysphagia barium done-per patient report, no findings--defer to GI whether to scope Complete heart block on admission Pacemaker interrogated defer to cardiology further management Probable AKI Baseline creatinine 1.3 range Have limited multiple medications such as Lasix Aldactone Benicar as below Creatinine improved Keep volumes even do not give IV fluid at this time Prior ICM with improved EF Continue meds Coreg 6.25 twice daily-holding Aldactone 25, Benicar 40 daily, Lasix 40 daily Hyperlipidemia Holding TriCor as well as Pravachol Newly diagnosed probable hepatocellular CA Will need outpatient management and reassessment by GI and oncology-following up on labs ordered by oncology today Diabetes mellitus  type 2 with neuropathy Given SSI regimen with U 500--adjusted given frequent lows last pm Atrial fibrillation chads score >4 -continue Coreg as above, currently on IV heparin, will switch back to oral anticoagulation shortly OSA Continue CPAP at night BMI 41 Monitor  On IV heparin Discussed with wife at the bedside Inpatient Dispel pending input from therapy after placing brace may need rehab  Robert Au, MD  [facility] Via amion app OR [email or web address] 7PM-7AM contact night coverage as above 12/05/2018, 12:13 PM  LOS: 2 days   Consultants:  Cardiology  Gastroenterology  Oncology  Procedures:  multiple  Antimicrobials:   iv vacn and ceftriax  Interval history/Subjective:  Awake alert  7/10 pain epig pain at times, no fever no chills no n/v Pain swallowing   Objective:  Vitals:  Vitals:   12/05/18 0546 12/05/18 1138  BP: (!) 107/49 (!) 112/59  Pulse: 71 79  Resp:  18  Temp: 97.7 F (36.5 C) 97.6 F (36.4 C)  SpO2: 96% 95%    Exam:  EOMI NCAT  Abdomen soft obese nontender no rebound no guarding no HSM Left lower extremity as below Neurologically intact Finger-nose-finger intact Power 5/5    I have personally reviewed the following:  DATA   Labs:  Sodium 131--->133   potassium 5.4-->4.9   BUN/creatinine 59/2.3--45/1.74 baseline in the 1.3 range  Hemoglobin 10.9-->9.8 platelet 88   Scheduled Meds: . calcitonin (salmon)  1 spray Alternating Nares Daily  . carvedilol  6.25 mg Oral BID WC  . docusate sodium  100 mg Oral BID  . insulin regular human CONCENTRATED  0-130 Units Subcutaneous TID WC  .  lidocaine  1 patch Transdermal Q24H  . sodium chloride flush  3 mL Intravenous Q12H   Continuous Infusions: . sodium chloride 100 mL/hr at 12/05/18 0611  .  ceFAZolin (ANCEF) IV 1 g (12/05/18 0603)  . heparin 1,500 Units/hr (12/05/18 0610)    Principal Problem:   Near syncope Active Problems:   Essential hypertension    Hyperlipidemia   Type 2 diabetes mellitus with stage 3 chronic kidney disease (HCC)   Chronic back pain   Obesity, Class III, BMI 40-49.9 (morbid obesity) (HCC)   Cellulitis of right lower extremity   Complete heart block (HCC)   Acute renal failure superimposed on stage 3 chronic kidney disease ([place])   Hepatocellular carcinoma ([place])   LOS: 2 days

## 2018-12-05 NOTE — Progress Notes (Signed)
CPAP at bedside RT added sterile water.  Patient places self on CPAP machine.  RT assistance not needed at this time.

## 2018-12-05 NOTE — Evaluation (Signed)
Occupational Therapy Evaluation Patient Details Name: Robert Gregory MRN: [id number] DOB: 20-May-1941 Today's Date: 12/05/2018    History of Present Illness Patient is a 78 y/o male who presents with dizziness, bradycardia and near syncope. Pt with complete heart block and metastatic hepatocellular carinoma with T7 compression fx. Possible for verterbroplasty. PMH includes DM, MI, ICD, HTN, HLD, A-fib.   Clinical Impression   PTA, pt was living with his wife and performing BADLs and using RW vs SPC. Pt currently requiring Min-Max A for UB ADLs, Max A for LB ADLs, and Min A for functional mobility with RW. Pt presenting with decreased strength, balance, and activity tolerance. Pt highly motivated to participate in therapy despite pain and has good family support. Provided education on back precautions and brace management. Pt will require further acute OT to increase safety and independence with ADLs and functional mobility. Recommend dc to CIR for intensive rehab to optimize safety with ADLs, return to PLOF, and decrease caregiver burden.     Follow Up Recommendations  CIR;Supervision/Assistance - 24 hour    Equipment Recommendations  Other (comment)(Defer to next venue)    Recommendations for Other Services Rehab consult;PT consult     Precautions / Restrictions Precautions Precautions: Fall;Back Precaution Booklet Issued: No Precaution Comments: Reviewed precautions and brace.  Required Braces or Orthoses: Spinal Brace Spinal Brace: Thoracolumbosacral orthotic;Applied in sitting position Restrictions Weight Bearing Restrictions: No      Mobility Bed Mobility Overal bed mobility: Needs Assistance Bed Mobility: Sidelying to Sit   Sidelying to sit: Mod assist;HOB elevated       General bed mobility comments: Pt already in sidelying; Mod A to elevate trunk to get to EOB. Cues for log roll technique.  Transfers Overall transfer level: Needs assistance Equipment used: Rolling  walker (2 wheeled) Transfers: Sit to/from Stand Sit to Stand: Min assist;From elevated surface         General transfer comment: Min A to steady in standing; able to stand wtih elevated bed height and pushing through UEs. Cues for hand placement/technique. Stood from [employer], from chair x2, transferred torecliner.     Balance Overall balance assessment: Needs assistance Sitting-balance support: Feet supported;Bilateral upper extremity supported Sitting balance-Leahy Scale: Fair Sitting balance - Comments: BUE support as position of comfort due to back pain   Standing balance support: During functional activity Standing balance-Leahy Scale: Poor Standing balance comment: Pt requiring atleast one UE for support while brushing teeth at the sink                           ADL either performed or assessed with clinical judgement   ADL Overall ADL's : Needs assistance/impaired Eating/Feeding: Independent;Sitting   Grooming: Minimal assistance;Oral care;Standing Grooming Details (indicate cue type and reason): Pt performing oral care with Min A for standing balance at sink. Pt with difficulty performing bilateral tasks and maintaining balance without atleast one UE for support.  Upper Body Bathing: Minimal assistance;Sitting   Lower Body Bathing: Moderate assistance;Sit to/from stand Lower Body Bathing Details (indicate cue type and reason): Pt with urinary incontience at the sink and required assistance to wash BLEs while seated. Pt able to stable and perform peri care with cues to bend at his knees. Upper Body Dressing : Minimal assistance;Sitting;Maximal assistance Upper Body Dressing Details (indicate cue type and reason): Max A for donning brace Lower Body Dressing: Maximal assistance;Sit to/from stand Lower Body Dressing Details (indicate cue type and reason): Max  A to don/doff socks Toilet Transfer: Minimal assistance;+2 for safety/equipment;Ambulation(Simulated to  recliner)           Functional mobility during ADLs: Minimal assistance;+2 for safety/equipment;Rolling walker General ADL Comments: Pt with decreased performance of LB ADLs and standing tasks.     Vision         Perception     Praxis      Pertinent Vitals/Pain Pain Assessment: 0-10 Pain Score: 9  Pain Location: back radiating into torso and back Pain Descriptors / Indicators: Shooting;Grimacing;Guarding Pain Intervention(s): Monitored during session;Limited activity within patient's tolerance;Repositioned;Premedicated before session     Hand Dominance Right   Extremity/Trunk Assessment Upper Extremity Assessment Upper Extremity Assessment: Generalized weakness   Lower Extremity Assessment Lower Extremity Assessment: Defer to PT evaluation   Cervical / Trunk Assessment Cervical / Trunk Assessment: Other exceptions Cervical / Trunk Exceptions: T7 compression fx   Communication Communication Communication: No difficulties   Cognition Arousal/Alertness: Awake/alert Behavior During Therapy: WFL for tasks assessed/performed Overall Cognitive Status: Within Functional Limits for tasks assessed                                 General Comments: for basic mobility tasks.   General Comments  Wife present throughout session. VSS    Exercises     Shoulder Instructions      Home Living Family/patient expects to be discharged to:: Private residence Living Arrangements: Spouse/significant other Available Help at Discharge: Family;Available 24 hours/day Type of Home: House Home Access: Level entry     Home Layout: One level     Bathroom Shower/Tub: Tub/shower unit;Walk-in shower   Bathroom Toilet: Standard     Home Equipment: Environmental consultant - 2 wheels;Cane - single point;Shower seat - built in;Grab bars - tub/shower;Toilet riser          Prior Functioning/Environment Level of Independence: Independent with assistive device(s)        Comments:  Doing own dressing/bathing. Wife helps dry feet. Uses RW vs SPC for ambulation.        OT Problem List: Decreased range of motion;Decreased activity tolerance;Impaired balance (sitting and/or standing);Decreased knowledge of use of DME or AE;Decreased knowledge of precautions;Pain;Decreased strength;Obesity      OT Treatment/Interventions: Self-care/ADL training;Therapeutic exercise;Energy conservation;DME and/or AE instruction;Therapeutic activities;Patient/family education    OT Goals(Current goals can be found in the care plan section) Acute Rehab OT Goals Patient Stated Goal: to get this pain to decrease OT Goal Formulation: With patient/family Time For Goal Achievement: 12/19/18 Potential to Achieve Goals: Good  OT Frequency: Min 2X/week   Barriers to D/C:            Co-evaluation              AM-PAC OT "6 Clicks" Daily Activity     Outcome Measure Help from another person eating meals?: None Help from another person taking care of personal grooming?: A Little Help from another person toileting, which includes using toliet, bedpan, or urinal?: A Lot Help from another person bathing (including washing, rinsing, drying)?: A Lot Help from another person to put on and taking off regular upper body clothing?: A Lot Help from another person to put on and taking off regular lower body clothing?: A Lot 6 Click Score: 15   End of Session Equipment Utilized During Treatment: Rolling walker;Back brace Nurse Communication: Mobility status  Activity Tolerance: Patient tolerated treatment well;Patient limited by fatigue;Patient limited by pain Patient  left: in chair;with call bell/phone within reach;with family/visitor present  OT Visit Diagnosis: Unsteadiness on feet (R26.81);Other abnormalities of gait and mobility (R26.89);Muscle weakness (generalized) (M62.81);Pain Pain - part of body: (Back)                Time: 8251-8984 OT Time Calculation (min): 35 min Charges:  OT  General Charges $OT Visit: 1 Visit OT Evaluation $OT Eval Moderate Complexity: [place], OTR/L Acute Rehab Pager: [phone] Office: [place] 12/05/2018, 1:00 PM

## 2018-12-05 NOTE — Progress Notes (Addendum)
Robert Gregory clearly has hepatocellular carcinoma.  His alpha-fetoprotein is over 3400.  My concern still is the fact that he needs a vertebroplasty at T7.  I really doubt that him having cellulitis in the right lower leg is a reason not to do a vertebroplasty.  All cultures have been negative.  He does not have a central line in.  He is not having fevers.  He would be so much easier for him to have the vertebroplasty in the hospital while he is on heparin.  It would be safer this way for the patient.  He did have the back brace delivered yesterday.  I am sure this may help a little bit but in reality, the fracture needs to be fixed.  Again, this is all about quality of life.  The sooner we can try to get this fixed, the better off he will be.  I will speak with interventional radiology myself and try to get them "on board" to do the vertebroplasty.  His labs show that his platelet count is 66,000.  If he is to have a vertebroplasty, he will probably will need a platelet transfusion.  His hemoglobin is 9.8.  His creatinine is 1.74.  His blood sugars were a little bit low this morning.  He has had no cough.  He has had no nausea or vomiting.  His appetite is okay.   All of his vital signs are stable.  He is afebrile.  Oral exam shows no thrush.  Lungs sound relatively clear bilaterally.  Cardiac exam regular rate and rhythm.  He has no murmurs.  Abdomen is morbidly obese.  It is soft.  Bowel sounds might be slightly decreased.  He has no guarding or rebound tenderness.  Extremities shows some chronic mild pitting edema bilaterally.  He has a dressing on the right lower leg.  Neurological exam is nonfocal.  Again, my goal with [name] is his quality of life.  I do appreciate everybody's help.  I know that he is getting fantastic care from all staff up on 6 E.  Lattie Haw, MD  Darlyn Chamber 29:11   ADDENDUM: I spoke to interventional radiology myself.  Apparently, they had not heard about Mr. Aycock.   I put the consult in yesterday.  They said that they probably would have no issues doing his vertebral plasty while he is in the hospital.  I do think that they are worried that he has this cellulitis in the right lower leg.  His cultures have been all negative.  He has had no fever.  The only issue might be his platelet count.  We might have to transfuse him with platelets when they do the procedure.  I need to order a CT of the thoracic spine so that they can take a look at the compression fracture since his last CT scan of the thoracic spine was outside of the [facility] system.  Again, it would be somewhat more convenient for the patient to have the vertebroplasty in the hospital as he is on heparin now and would not have to be bridged with Lovenox which we would have to do as an outpatient.  Lattie Haw, MD

## 2018-12-05 NOTE — Progress Notes (Signed)
[facility] Gastroenterology Progress Note  Robert Gregory 78 y.o. 11-22-1940  CC: Hepatocellular carcinoma, abdominal pain, difficulty swallowing   Subjective: Continues to have abdominal discomfort.  Complaining of difficulty swallowing particularly solid foods.  Complaining of nausea and decreased appetite.  ROS : Positive for back pain.  Negative for bleeding.  Positive for fatigue and weakness   Objective: Vital signs in last 24 hours: Vitals:   12/04/18 2115 12/05/18 0546  BP: 113/64 (!) 107/49  Pulse: 74 71  Resp:    Temp: 98.2 F (36.8 C) 97.7 F (36.5 C)  SpO2: 96% 96%    Physical Exam:  General:   Alert,  Well-developed, sitting in the chair.  Not in acute distress Lungs:  Clear throughout to auscultation.   No wheezes, crackles, or rhonchi. No acute distress. Heart:  Regular rate and rhythm; no murmurs, clicks, rubs,  or gallops. Abdomen: Abdomen mild distended, right upper quadrant and epigastric tenderness to palpation noted, bowel sounds present.  No peritoneal signs LE : Right lower extremity Ace wrap noted.  No edema over left lower extremity. Psych : Mood and affect normal Neuro : A/O X 3    Lab Results: Recent Labs    12/04/18 0416 12/05/18 0335  NA 131* 133*  K 5.4* 4.9  CL 103 106  CO2 20* 20*  GLUCOSE 205* 76  BUN 59* 45*  CREATININE 2.36* 1.74*  CALCIUM 8.8* 8.6*   Recent Labs    12/03/18 1417 12/05/18 0335  AST 85* 87*  ALT 94* 96*  ALKPHOS 151* 125  BILITOT 1.0 1.2  PROT 6.5 6.1*  ALBUMIN 3.1* 3.4*   Recent Labs    12/03/18 1417 12/04/18 0416 12/05/18 0335  WBC 8.3 6.8 4.7  NEUTROABS 6.3  --  3.2  HGB 11.7* 10.9* 9.8*  HCT 37.5* 33.3* 30.7*  MCV 94.2 92.5 92.5  PLT 115* 88* 66*   No results for input(s): LABPROT, INR in the last 72 hours.    Assessment/Plan: -Recently diagnosed hepatocellular carcinoma in setting of underlying cirrhosis.  Probably metastatic.  Significantly elevated alpha-fetoprotein which was checked in  the office recently. -Dysphagia.  Unspecified. -Acute kidney injury.  Would be from recent IV contrast. ?? HRS.  Kidney functions improving. -abnormal LFTs.  Improving -Chronic anemia and thrombocytopenia.  Recommendations ------------------------ -Barium swallow for further evaluation of dysphasia. -Continue IV albumin for elevated kidney functions - No need for liver biopsy from GI standpoint as CT scan is diagnostic for definitive hepatocellular carcinoma. -GI will follow   Otis Brace MD, [place] 12/05/2018, 10:32 AM  Contact #  [phone]

## 2018-12-06 ENCOUNTER — Ambulatory Visit: Payer: Medicare Other

## 2018-12-06 ENCOUNTER — Inpatient Hospital Stay (HOSPITAL_COMMUNITY): Payer: Medicare Other

## 2018-12-06 ENCOUNTER — Ambulatory Visit: Payer: Medicare Other | Admitting: Family

## 2018-12-06 ENCOUNTER — Other Ambulatory Visit: Payer: Medicare Other

## 2018-12-06 DIAGNOSIS — R5381 Other malaise: Secondary | ICD-10-CM

## 2018-12-06 DIAGNOSIS — D649 Anemia, unspecified: Secondary | ICD-10-CM

## 2018-12-06 DIAGNOSIS — R55 Syncope and collapse: Secondary | ICD-10-CM

## 2018-12-06 DIAGNOSIS — I4891 Unspecified atrial fibrillation: Secondary | ICD-10-CM

## 2018-12-06 DIAGNOSIS — E875 Hyperkalemia: Secondary | ICD-10-CM

## 2018-12-06 DIAGNOSIS — I442 Atrioventricular block, complete: Secondary | ICD-10-CM

## 2018-12-06 DIAGNOSIS — I10 Essential (primary) hypertension: Secondary | ICD-10-CM

## 2018-12-06 DIAGNOSIS — N179 Acute kidney failure, unspecified: Secondary | ICD-10-CM

## 2018-12-06 DIAGNOSIS — C220 Liver cell carcinoma: Secondary | ICD-10-CM

## 2018-12-06 DIAGNOSIS — S22069A Unspecified fracture of T7-T8 vertebra, initial encounter for closed fracture: Secondary | ICD-10-CM

## 2018-12-06 LAB — COMPREHENSIVE METABOLIC PANEL
ALT: 65 U/L — AB (ref 0–44)
AST: 55 U/L — ABNORMAL HIGH (ref 15–41)
Albumin: 4 g/dL (ref 3.5–5.0)
Alkaline Phosphatase: 110 U/L (ref 38–126)
Anion gap: 9 (ref 5–15)
BUN: 36 mg/dL — ABNORMAL HIGH (ref 8–23)
CO2: 19 mmol/L — AB (ref 22–32)
Calcium: 9 mg/dL (ref 8.9–10.3)
Chloride: 107 mmol/L (ref 98–111)
Creatinine, Ser: 1.45 mg/dL — ABNORMAL HIGH (ref 0.61–1.24)
GFR calc Af Amer: 53 mL/min — ABNORMAL LOW (ref >60)
GFR calc non Af Amer: 46 mL/min — ABNORMAL LOW (ref >60)
GLUCOSE: 224 mg/dL — AB (ref 70–99)
Potassium: 5.2 mmol/L — ABNORMAL HIGH (ref 3.5–5.1)
Sodium: 135 mmol/L (ref 135–145)
Total Bilirubin: 1.5 mg/dL — ABNORMAL HIGH (ref 0.3–1.2)
Total Protein: 6.5 g/dL (ref 6.5–8.1)

## 2018-12-06 LAB — BASIC METABOLIC PANEL
Anion gap: 11 (ref 5–15)
Anion gap: 10 (ref 5–15)
Anion gap: 7 (ref 5–15)
BUN: 36 mg/dL — ABNORMAL HIGH (ref 8–23)
BUN: 36 mg/dL — AB (ref 8–23)
BUN: 35 mg/dL — ABNORMAL HIGH (ref 8–23)
CO2: 14 mmol/L — ABNORMAL LOW (ref 22–32)
CO2: 16 mmol/L — ABNORMAL LOW (ref 22–32)
CO2: 20 mmol/L — ABNORMAL LOW (ref 22–32)
CREATININE: 1.37 mg/dL — AB (ref 0.61–1.24)
CREATININE: 1.3 mg/dL — AB (ref 0.61–1.24)
Calcium: 8.7 mg/dL — ABNORMAL LOW (ref 8.9–10.3)
Calcium: 8.9 mg/dL (ref 8.9–10.3)
Calcium: 8.8 mg/dL — ABNORMAL LOW (ref 8.9–10.3)
Chloride: 106 mmol/L (ref 98–111)
Chloride: 108 mmol/L (ref 98–111)
Chloride: 108 mmol/L (ref 98–111)
Creatinine, Ser: 1.33 mg/dL — ABNORMAL HIGH (ref 0.61–1.24)
GFR calc Af Amer: 59 mL/min — ABNORMAL LOW (ref >60)
GFR calc Af Amer: 57 mL/min — ABNORMAL LOW (ref >60)
GFR calc non Af Amer: 51 mL/min — ABNORMAL LOW (ref >60)
GFR calc non Af Amer: 49 mL/min — ABNORMAL LOW (ref >60)
GFR calc non Af Amer: 53 mL/min — ABNORMAL LOW (ref >60)
Glucose, Bld: 259 mg/dL — ABNORMAL HIGH (ref 70–99)
Glucose, Bld: 246 mg/dL — ABNORMAL HIGH (ref 70–99)
Glucose, Bld: 222 mg/dL — ABNORMAL HIGH (ref 70–99)
Potassium: 6.3 mmol/L (ref 3.5–5.1)
Potassium: 5.6 mmol/L — ABNORMAL HIGH (ref 3.5–5.1)
Potassium: 5.7 mmol/L — ABNORMAL HIGH (ref 3.5–5.1)
SODIUM: 135 mmol/L (ref 135–145)
Sodium: 131 mmol/L — ABNORMAL LOW (ref 135–145)
Sodium: 134 mmol/L — ABNORMAL LOW (ref 135–145)

## 2018-12-06 LAB — GLUCOSE, CAPILLARY
GLUCOSE-CAPILLARY: 219 mg/dL — AB (ref 70–99)
Glucose-Capillary: 222 mg/dL — ABNORMAL HIGH (ref 70–99)
Glucose-Capillary: 185 mg/dL — ABNORMAL HIGH (ref 70–99)
Glucose-Capillary: 270 mg/dL — ABNORMAL HIGH (ref 70–99)
Glucose-Capillary: 207 mg/dL — ABNORMAL HIGH (ref 70–99)
Glucose-Capillary: 263 mg/dL — ABNORMAL HIGH (ref 70–99)

## 2018-12-06 LAB — CBC
HCT: 30.7 % — ABNORMAL LOW (ref 39.0–52.0)
Hemoglobin: 10.1 g/dL — ABNORMAL LOW (ref 13.0–17.0)
MCH: 30.6 pg (ref 26.0–34.0)
MCHC: 32.9 g/dL (ref 30.0–36.0)
MCV: 93 fL (ref 80.0–100.0)
NRBC: 0 % (ref 0.0–0.2)
Platelets: 67 10*3/uL — ABNORMAL LOW (ref 150–400)
RBC: 3.3 MIL/uL — AB (ref 4.22–5.81)
RDW: 18.6 % — ABNORMAL HIGH (ref 11.5–15.5)
WBC: 6 10*3/uL (ref 4.0–10.5)

## 2018-12-06 LAB — HEPARIN LEVEL (UNFRACTIONATED): Heparin Unfractionated: 0.32 IU/mL (ref 0.30–0.70)

## 2018-12-06 MED ORDER — MORPHINE SULFATE (PF) 2 MG/ML IV SOLN
2.0000 mg | INTRAVENOUS | Status: AC | PRN
  Administered 2018-12-06 – 2018-12-07 (×2): 2 mg via INTRAVENOUS
  Filled 2018-12-06 (×2): qty 1

## 2018-12-06 MED ORDER — SODIUM ZIRCONIUM CYCLOSILICATE 10 G PO PACK
10.0000 g | PACK | Freq: Once | ORAL | Status: AC
  Administered 2018-12-06: 10 g via ORAL
  Filled 2018-12-06: qty 1

## 2018-12-06 MED ORDER — OXYCODONE-ACETAMINOPHEN 7.5-325 MG PO TABS
1.0000 | ORAL_TABLET | Freq: Four times a day (QID) | ORAL | Status: DC | PRN
  Administered 2018-12-06 (×2): 1 via ORAL
  Filled 2018-12-06 (×2): qty 1

## 2018-12-06 NOTE — Progress Notes (Addendum)
[facility] PROGRESS NOTE  ERIVERTO BYRNES RXV:[id number] DOB: Sep 22, 1940 DOA: 12/03/2018  PCP: Seward Carol, MD  Brief History/Interval Summary: 78 year old male who has a past medical history of coronary artery disease status post CABG in 1997, history of pacemaker placement, history of atrial fibrillation, chronic kidney disease stage III, sleep apnea, anemia, diabetes presented with symptomatic bradycardia.  Thought to have complete heart block.  He was seen by cardiology who adjusted his pacemaker settings with improvement.  Patient was also found to have right lower extremity cellulitis.  He was also recently diagnosed with hepatocellular carcinoma.  Since he had back pain he was also diagnosed as having T7 compression fracture.  Reason for Visit: Right lower extremity cellulitis.  T7 compression fracture.  Consultants: Cardiology.  Gastroenterology.  Medical oncology.  Interventional radiology.  Procedures: Pacemaker interrogation and adjustment of settings.  Antibiotics: Initially placed on vancomycin and ceftriaxone.  Currently on cefazolin.  Subjective/Interval History: Patient continues to have severe back pain about 8 out of 10 in intensity.  He is finding it difficult to do anything at all with his pain.  ROS: Denies any chest pain or shortness of breath.  Objective:  Vital Signs  Vitals:   12/05/18 1138 12/05/18 2055 12/06/18 0413 12/06/18 0419  BP: (!) 112/59 (!) 98/41 (!) 113/54   Pulse: 79 82 84   Resp: 18 20 18    Temp: 97.6 F (36.4 C) 97.6 F (36.4 C) 98 F (36.7 C)   TempSrc: Oral  Oral   SpO2: 95% 97% 96%   Weight:    (!) 145.3 kg  Height:        Intake/Output Summary (Last 24 hours) at 12/06/2018 1108 Last data filed at 12/06/2018 0900 Gross per 24 hour  Intake 2617.52 ml  Output 450 ml  Net 2167.52 ml   Filed Weights   12/04/18 0600 12/05/18 0546 12/06/18 0419  Weight: (!) 140.5 kg (!) 141 kg (!) 145.3 kg    General appearance:  Awake alert.  In no distress Resp: Clear to auscultation bilaterally.  Normal effort Cardio: S1-S2 is normal regular.  No S3-S4.  No rubs murmurs or bruit GI: Abdomen is soft.  Nontender nondistended.  Bowel sounds are present normal.  No masses organomegaly Extremities: No edema.  Able to lift both legs off the bed.  Improvement in cellulitis of the right lower extremity. Neurologic: Alert and oriented x3.  No focal neurological deficits.    Lab Results:  Data Reviewed: I have personally reviewed following labs and imaging studies  CBC: Recent Labs  Lab 12/03/18 1417 12/04/18 0416 12/05/18 0335 12/06/18 0310  WBC 8.3 6.8 4.7 6.0  NEUTROABS 6.3  --  3.2  --   HGB 11.7* 10.9* 9.8* 10.1*  HCT 37.5* 33.3* 30.7* 30.7*  MCV 94.2 92.5 92.5 93.0  PLT 115* 88* 66* 67*    Basic Metabolic Panel: Recent Labs  Lab 12/03/18 1417 12/03/18 1833 12/04/18 0416 12/05/18 0335 12/06/18 0310  NA 125* 125* 131* 133* 135  K 6.2* 5.4* 5.4* 4.9 5.2*  CL 95* 96* 103 106 107  CO2 19* 21* 20* 20* 19*  GLUCOSE 342* 248* 205* 76 224*  BUN 58* 58* 59* 45* 36*  CREATININE 2.78* 2.60* 2.36* 1.74* 1.45*  CALCIUM 8.8* 8.8* 8.8* 8.6* 9.0    GFR: Estimated Creatinine Clearance: 64 mL/min (A) (by C-G formula based on SCr of 1.45 mg/dL (H)).  Liver Function Tests: Recent Labs  Lab 12/03/18 1417 12/05/18 0335 12/06/18 0310  AST 85* 87* 55*  ALT 94* 96* 65*  ALKPHOS 151* 125 110  BILITOT 1.0 1.2 1.5*  PROT 6.5 6.1* 6.5  ALBUMIN 3.1* 3.4* 4.0     Cardiac Enzymes: Recent Labs  Lab 12/03/18 2230 12/04/18 0416 12/04/18 1103  TROPONINI 0.06* 0.06* 0.05*     CBG: Recent Labs  Lab 12/05/18 1132 12/05/18 1623 12/05/18 1735 12/05/18 2207 12/06/18 0747  GLUCAP 131* 202* 185* 222* 185*      Radiology Studies: Dg Esophagus W Single Cm (sol Or Thin Ba)  Result Date: 12/05/2018 CLINICAL DATA:  78 year old male with history of difficulty swallowing. Unable to belt. EXAM:  ESOPHOGRAM/BARIUM SWALLOW TECHNIQUE: Single contrast examination was performed using  thin barium. FLUOROSCOPY TIME:  Fluoroscopy Time:  42 seconds Radiation Exposure Index (if provided by the fluoroscopic device): 9.9 mGy COMPARISON:  None. FINDINGS: Single contrast imaging of the esophagus was performed due to limited patient mobility. Within the limitations of the examination, the esophagus appears structurally normal, without definite esophageal mass, stricture or esophageal ring. No hiatal hernia. During real-time observation multiple single swallow attempts fail to fully propagate, and extensive tertiary contractions were observed throughout the examination. IMPRESSION: 1. Nonspecific esophageal motility disorder with extensive tertiary contractions. Electronically Signed   By: Vinnie Langton M.D.   On: 12/05/2018 14:14     Medications:  Scheduled: . calcitonin (salmon)  1 spray Alternating Nares Daily  . carvedilol  6.25 mg Oral BID WC  . docusate sodium  100 mg Oral BID  . insulin regular human CONCENTRATED  0-130 Units Subcutaneous TID WC  . lidocaine  1 patch Transdermal Q24H  . sodium chloride flush  3 mL Intravenous Q12H   Continuous: . sodium chloride 100 mL/hr at 12/06/18 0256  .  ceFAZolin (ANCEF) IV 1 g (12/06/18 2706)  . heparin 1,500 Units/hr (12/05/18 2256)   CBJ:[id number] **OR** acetaminophen, methocarbamol, ondansetron **OR** ondansetron (ZOFRAN) IV, oxyCODONE-acetaminophen    Assessment/Plan:  Recurrent right lower extremity cellulitis Initially placed on vancomycin and ceftriaxone.  Transitioned to cefazolin after discussions with infectious disease.  Right leg has significantly improved.  No significant erythema noted.  Swelling has improved.  Continue IV antibiotics for another 24 hours and then transition to oral antibiotics.  T7 fracture Could be pathologic.  Patient with severe symptoms.  Ongoing for the past 4 weeks.  He had a CT scan of his thoracic  spine done on 3/10.  Report is available on care everywhere.  Previous rounding providers as well as oncology has discussed this with patient.  He is unable to do much because of this pain.  Vertebroplasty was discussed.  However due to cellulitis there was concern of spreading infection.  However patient does not appear to have any systemic symptoms.  He is afebrile.  His WBC is normal.  The cellulitis has improved.  His risk of infection seems to be low.  We will consult IR to assist with management.  Dysphagia Seen by gastroenterology.  Barium swallow does show evidence for dysmotility.  Per gastroenterology no further evaluation necessary in the hospital.  Outpatient follow-up.  Hepatocellular carcinoma Management per medical oncology.  Complete heart block on admission Patient had a pacemaker which was interrogated and settings were adjusted by cardiology.  Seems to be stable now.  ARF with hyperkalemia Baseline creatinine about 1.3.  His creatinine was noted to be elevated at admission.  Medications were adjusted.  Creatinine has improved.  Potassium noted to be mildly elevated.  We will recheck it  later today.  Normocytic anemia and thrombocytopenia Platelet counts are stable.  Hemoglobin is stable.  No evidence of overt bleeding.  History of cardiomyopathy He had a low EF previously which has improved.  Continue cardiac medications.  Hyperlipidemia Holding TriCor and statin.  Diabetes mellitus type 2 with neuropathy Continue SSI with U500.  Atrial fibrillation Chads vascular score greater than 4.  Patient was on Xarelto prior to admission.  Currently on IV heparin in case there is a need for procedure.  History of obstructive sleep apnea CPAP  Morbid obesity BMI is 42.26.   DVT Prophylaxis: Continue IV heparin    Code Status: DNR Family Communication: Discussed with the patient and his wife Disposition Plan: Management as outlined above.  Inpatient rehabilitation is  following.    LOS: 3 days   [place] [employer] on [email or web address]  12/06/2018, 11:08 AM

## 2018-12-06 NOTE — Progress Notes (Signed)
[facility] Gastroenterology Progress Note  Robert Gregory 78 y.o. 1941/02/07  CC: Hepatocellular carcinoma, abdominal pain, difficulty swallowing   Subjective: Patient resting comfortably in the bed.  Discussed with wife.  Patient continues to have back pain rating to upper abdomen.  ROS : Positive for back pain.  Negative for bleeding.  Positive for fatigue and weakness   Objective: Vital signs in last 24 hours: Vitals:   12/05/18 2055 12/06/18 0413  BP: (!) 98/41 (!) 113/54  Pulse: 82 84  Resp: 20 18  Temp: 97.6 F (36.4 C) 98 F (36.7 C)  SpO2: 97% 96%    Physical Exam:  Resting comfortably in the bed.   exam not performed today.   Lab Results: Recent Labs    12/05/18 0335 12/06/18 0310  NA 133* 135  K 4.9 5.2*  CL 106 107  CO2 20* 19*  GLUCOSE 76 224*  BUN 45* 36*  CREATININE 1.74* 1.45*  CALCIUM 8.6* 9.0   Recent Labs    12/05/18 0335 12/06/18 0310  AST 87* 55*  ALT 96* 65*  ALKPHOS 125 110  BILITOT 1.2 1.5*  PROT 6.1* 6.5  ALBUMIN 3.4* 4.0   Recent Labs    12/03/18 1417  12/05/18 0335 12/06/18 0310  WBC 8.3   < > 4.7 6.0  NEUTROABS 6.3  --  3.2  --   HGB 11.7*   < > 9.8* 10.1*  HCT 37.5*   < > 30.7* 30.7*  MCV 94.2   < > 92.5 93.0  PLT 115*   < > 66* 67*   < > = values in this interval not displayed.   No results for input(s): LABPROT, INR in the last 72 hours.    Assessment/Plan: -Recently diagnosed hepatocellular carcinoma in setting of underlying cirrhosis.  Probably metastatic.  Significantly elevated alpha-fetoprotein which was checked in the office recently. -Dysphagia.  Unspecified. -Acute kidney injury.  Would be from recent IV contrast. ?? HRS.  Kidney functions proved -abnormal LFTs.  Improving -Chronic anemia and thrombocytopenia.  Recommendations ------------------------ -Barium swallow showed nonspecific esophageal motility disorder.  No esophageal mass, stricture or ring. -Kidney function improved.   - No need for liver  biopsy from GI standpoint as CT scan is diagnostic for definitive hepatocellular carcinoma. -No further inpatient GI work-up planned.  GI will sign off.  Call us back if needed.  Follow-up with primary GI Dr. Paulita Fujita as needed.    Otis Brace MD, [place] 12/06/2018, 10:55 AM  Contact #  [phone]

## 2018-12-06 NOTE — Progress Notes (Addendum)
HEMATOLOGY-ONCOLOGY PROGRESS NOTE  SUBJECTIVE: Patient having ongoing back pain.  He initially refused his CT scan earlier today due to pain.  He was given a dose of morphine which improved his pain and then was able to get the CT scan done.  This was just performed prior to my visit and results not available yet.  Has remained afebrile.  Denies chest discomfort and shortness of breath.  Denies nausea, vomiting, constipation, diarrhea.  Denies bleeding.  REVIEW OF SYSTEMS:   Constitutional: Denies fevers, chills or abnormal weight loss Eyes: Denies blurriness of vision Ears, nose, mouth, throat, and face: Denies mucositis or sore throat Respiratory: Denies cough, dyspnea or wheezes Cardiovascular: Denies palpitation, chest discomfort Gastrointestinal:  Denies nausea, heartburn or change in bowel habits Skin: Ongoing cellulitis to his right lower extremity. Lymphatics: Denies new lymphadenopathy or easy bruising Musculoskeletal: He has persistent back pain. Neurological:Denies numbness, tingling or new weaknesses Behavioral/Psych: Mood is stable, no new changes  Extremities: Trace bilateral lower extremity edema. All other systems were reviewed with the patient and are negative.  I have reviewed the past medical history, past surgical history, social history and family history with the patient and they are unchanged from previous note.   PHYSICAL EXAMINATION:  Vitals:   12/05/18 2055 12/06/18 0413  BP: (!) 98/41 (!) 113/54  Pulse: 82 84  Resp: 20 18  Temp: 97.6 F (36.4 C) 98 F (36.7 C)  SpO2: 97% 96%   Filed Weights   12/04/18 0600 12/05/18 0546 12/06/18 0419  Weight: (!) 309 lb 11.9 oz (140.5 kg) (!) 310 lb 12.8 oz (141 kg) (!) 320 lb 5.3 oz (145.3 kg)    GENERAL:alert, no distress and comfortable SKIN: skin color, texture, turgor are normal.  Improving cellulitis of the right lower extremity. EYES: normal, Conjunctiva are pink and non-injected, sclera clear OROPHARYNX:no  exudate, no erythema and lips, buccal mucosa, and tongue normal  NECK: supple, thyroid normal size, non-tender, without nodularity LYMPH:  no palpable lymphadenopathy in the cervical, axillary or inguinal LUNGS: clear to auscultation and percussion with normal breathing effort HEART: regular rate & rhythm and no murmurs.  Trace bilateral lower extremity edema. ABDOMEN:abdomen soft, non-tender and normal bowel sounds Musculoskeletal:no cyanosis of digits and no clubbing  NEURO: alert & oriented x 3 with fluent speech, no focal motor/sensory deficits  LABORATORY DATA:  I have reviewed the data as listed CMP Latest Ref Rng & Units 12/06/2018 12/06/2018 12/05/2018  Glucose 70 - 99 mg/dL 259(H) 224(H) 76  BUN 8 - 23 mg/dL 36(H) 36(H) 45(H)  Creatinine 0.61 - 1.24 mg/dL 1.33(H) 1.45(H) 1.74(H)  Sodium 135 - 145 mmol/L 131(L) 135 133(L)  Potassium 3.5 - 5.1 mmol/L 6.3(HH) 5.2(H) 4.9  Chloride 98 - 111 mmol/L 106 107 106  CO2 22 - 32 mmol/L 14(L) 19(L) 20(L)  Calcium 8.9 - 10.3 mg/dL 8.7(L) 9.0 8.6(L)  Total Protein 6.5 - 8.1 g/dL - 6.5 6.1(L)  Total Bilirubin 0.3 - 1.2 mg/dL - 1.5(H) 1.2  Alkaline Phos 38 - 126 U/L - 110 125  AST 15 - 41 U/L - 55(H) 87(H)  ALT 0 - 44 U/L - 65(H) 96(H)    Lab Results  Component Value Date   WBC 6.0 12/06/2018   HGB 10.1 (L) 12/06/2018   HCT 30.7 (L) 12/06/2018   MCV 93.0 12/06/2018   PLT 67 (L) 12/06/2018   NEUTROABS 3.2 12/05/2018    ASSESSMENT AND PLAN: Hepatocellular carcinoma: AFP is greater than 3400.  GI has seen the patient  and says that there was no need for liver biopsy at this time as CT scan is diagnostic for definitive hepatocellular carcinoma. Further treatment will be discussed as an outpatient per [name].  T7 fracture Could be pathologic.  Patient with severe symptoms which have really affected his functional status.  Ongoing for the past 4 weeks.  He had a CT scan of his thoracic spine done on 3/10.  CT scan of thoracic spine was  performed earlier today and results are not yet available to me.  Vertebroplasty has been recommended, however due to cellulitis there was concern of spreading infection.  The patient does not appear to have any systemic symptoms.  He is afebrile.  His WBC is normal. The cellulitis has improved.  His risk of infection seems to be low.    Interventional radiology has been consulted for possible vertebroplasty.  Platelet count has been in the 60,000 range and he will likely need a platelet transfusion prior to this procedure.  Recurrent right lower extremity cellulitis Remains on cefazolin with improvement in his cellulitis.  May transition to oral antibiotics in the next day or two.  ARF with hyperkalemia Baseline creatinine about 1.3.  His creatinine was noted to be elevated at admission. Medications were adjusted.  Creatinine has improved.  Potassium remains mildly elevated.  Management per hospitalist.  Normocytic anemia and thrombocytopenia Hemoglobin overall remained stable and is 10.1 today.  Platelets are stable at 67,000.  He has no evidence of bleeding.  Again, he will likely need a platelet transfusion prior to vertebroplasty.  Atrial fibrillation Chads vascular score greater than 4.  Patient was on Xarelto prior to admission. Currently on IV heparin.  Can transition back to Xarelto after vertebroplasty and if no other procedures are planned.  Mikey Bussing, DNP, AGPCNP-BC, AOCNP  Mr. March Rummage was interviewed and examined.  He has been diagnosed with advanced hepatocellular carcinoma. He has pain due to T7 compression fracture.  We will adjust the oral pain regimen. He is being evaluated for a vertebroplasty procedure. I discussed systemic treatment options with Mr. March Rummage and his wife.  He will begin treatment as an outpatient, likely with immunotherapy.

## 2018-12-06 NOTE — Progress Notes (Signed)
Patient has home CPAP at bedside.  RT assistance not needed at this time. 

## 2018-12-06 NOTE — Progress Notes (Addendum)
Physical Therapy Treatment Patient Details Name: Robert Gregory MRN: [id number] DOB: 07/23/1941 Today's Date: 12/06/2018    History of Present Illness Patient is a 78 y/o male who presents with dizziness, bradycardia and near syncope. Pt with complete heart block and metastatic hepatocellular carinoma with T7 compression fx. Possible for verterbroplasty. PMH includes DM, MI, ICD, HTN, HLD, A-fib.    PT Comments    Pt admitted with above diagnosis. Pt currently with functional limitations due to balance and endurance deficits.Pt was able to ambulate 50 feet with RW with min assist and cues.  Pt has the most difficulty with bed mobility and getting comfortable in bed as he cannot tolerate sitting in chair.  Needs rehab to work on increasing ability to tolerate sitting up.   Pt is making progress although slow.  Pt will benefit from skilled PT to increase their independence and safety with mobility to allow discharge to the venue listed below.     Follow Up Recommendations  CIR;Supervision for mobility/OOB     Equipment Recommendations  None recommended by PT , may need hospital bed and reclining wheelchair   Recommendations for Other Services       Precautions / Restrictions Precautions Precautions: Fall;Back Precaution Booklet Issued: No Precaution Comments: Reviewed precautions and brace.  Required Braces or Orthoses: Spinal Brace Spinal Brace: Thoracolumbosacral orthotic;Applied in standing position Restrictions Weight Bearing Restrictions: No    Mobility  Bed Mobility Overal bed mobility: Needs Assistance Bed Mobility: Sidelying to Sit   Sidelying to sit: Mod assist;HOB elevated;+2 for physical assistance       General bed mobility comments: Wife came to desk and got PT and asked if PT could take brace off and PT came to room to assist pt.   Transfers Overall transfer level: Needs assistance Equipment used: Rolling walker (2 wheeled) Transfers: Sit to/from Stand Sit to  Stand: Min assist;From elevated surface;+2 safety/equipment         General transfer comment: Min A to steady in standing; able to stand wtih elevated bed height and pushing through UEs. Cues for hand placement/technique. Total assist to don brace in standing.   Ambulation/Gait Ambulation/Gait assistance: Min assist;+2 safety/equipment;Min guard Gait Distance (Feet): 50 Feet Assistive device: Rolling walker (2 wheeled) Gait Pattern/deviations: Step-through pattern;Decreased stride length;Wide base of support;Shuffle Gait velocity: decreased Gait velocity interpretation: <1.31 ft/sec, indicative of household ambulator General Gait Details: Slow, unsteady gait with min to min guard A for balance/safety. VSS.  Pt needed cues to stay close to RW.    Stairs             Wheelchair Mobility    Modified Rankin (Stroke Patients Only)       Balance Overall balance assessment: Needs assistance Sitting-balance support: Feet supported;Bilateral upper extremity supported Sitting balance-Leahy Scale: Fair Sitting balance - Comments: BUE support as position of comfort due to back pain   Standing balance support: During functional activity;Bilateral upper extremity supported Standing balance-Leahy Scale: Poor Standing balance comment: Pt requiring atleast one UE for support                            Cognition Arousal/Alertness: Awake/alert Behavior During Therapy: WFL for tasks assessed/performed Overall Cognitive Status: Within Functional Limits for tasks assessed                                 General Comments: for  basic mobility tasks.      Exercises      General Comments General comments (skin integrity, edema, etc.): Wife present throughout session.       Pertinent Vitals/Pain Pain Assessment: 0-10 Pain Score: 10-Worst pain ever Pain Location: back radiating into torso and back Pain Descriptors / Indicators:  Shooting;Grimacing;Guarding Pain Intervention(s): Limited activity within patient's tolerance;Monitored during session;Repositioned    Home Living                      Prior Function            PT Goals (current goals can now be found in the care plan section) Acute Rehab PT Goals Patient Stated Goal: to get this pain to decrease PT Goal Formulation: With patient/family Time For Goal Achievement: 12/19/18 Potential to Achieve Goals: Good Progress towards PT goals: Progressing toward goals    Frequency    Min 3X/week      PT Plan Current plan remains appropriate    Co-evaluation              AM-PAC PT "6 Clicks" Mobility   Outcome Measure  Help needed turning from your back to your side while in a flat bed without using bedrails?: A Little Help needed moving from lying on your back to sitting on the side of a flat bed without using bedrails?: A Lot Help needed moving to and from a bed to a chair (including a wheelchair)?: A Little Help needed standing up from a chair using your arms (e.g., wheelchair or bedside chair)?: A Little Help needed to walk in hospital room?: A Little Help needed climbing 3-5 steps with a railing? : A Lot 6 Click Score: 16    End of Session Equipment Utilized During Treatment: Gait belt;Back brace Activity Tolerance: Patient tolerated treatment well;Patient limited by pain Patient left: with call bell/phone within reach;with family/visitor present;in bed;with bed alarm set(with bed in 41 degree chair position) Nurse Communication: Mobility status PT Visit Diagnosis: Pain;Difficulty in walking, not elsewhere classified (R26.2);Muscle weakness (generalized) (M62.81);Unsteadiness on feet (R26.81) Pain - part of body: (back)     Time: 0940-1009 PT Time Calculation (min) (ACUTE ONLY): 29 min  Charges:  $Gait Training: 23-37 mins                     [place] Pager:  [phone]  Office:   [place] 12/06/2018, 2:03 PM

## 2018-12-06 NOTE — Progress Notes (Signed)
OT Cancellation Note  Patient Details Name: Robert Gregory MRN: [id number] DOB: [birth year]   Cancelled Treatment:    Reason Eval/Treat Not Completed: Other (comment);Patient at procedure or test/ unavailable. Pt off floor. OT will return at later time when appropriate and if time allows.   Dorinda Hill OTR/L Acute Rehabilitation Services Office: [phone]   Wyn Forster 12/06/2018, 12:34 PM

## 2018-12-06 NOTE — Progress Notes (Signed)
12/06/18 1400  PT Visit Information  Last PT Received On 12/06/18  Assistance Needed +1 (+2 for lines/safety)  History of Present Illness Patient is a 78 y/o male who presents with dizziness, bradycardia and near syncope. Pt with complete heart block and metastatic hepatocellular carinoma with T7 compression fx. Possible for verterbroplasty. PMH includes DM, MI, ICD, HTN, HLD, A-fib.  Subjective Data  Patient Stated Goal to get this pain to decrease  Precautions  Precautions Fall;Back  Precaution Booklet Issued No  Precaution Comments Reviewed precautions and brace.   Required Braces or Orthoses Spinal Brace  Spinal Brace TLSO;Applied in sitting position  Restrictions  Weight Bearing Restrictions No  Pain Assessment  Pain Assessment 0-10  Pain Score 10  Pain Location back radiating into torso and back  Pain Descriptors / Indicators Shooting;Grimacing;Guarding  Pain Intervention(s) Limited activity within patient's tolerance;Monitored during session;Repositioned  Cognition  Arousal/Alertness Awake/alert  Behavior During Therapy WFL for tasks assessed/performed  Overall Cognitive Status Within Functional Limits for tasks assessed  General Comments for basic mobility tasks.  Bed Mobility  General bed mobility comments Wife came to desk and got PT and asked if PT could take brace off and PT came to room to assist pt.  Pt sat in bed with brace on for 20 minutes at 41 degrees chair position. PT arrived and pt in pain.  NT came in and PT assisted pt to supine and removed brace as well as changed linens as pt had soiled them with ooozing scrotum.  Pt positioned with pillows comfortably.   General Comments  General comments (skin integrity, edema, etc.) wife present  PT - End of Session  Equipment Utilized During Treatment Gait belt;Back brace  Activity Tolerance Patient limited by pain;Patient limited by fatigue  Patient left with call bell/phone within reach;with family/visitor  present;in bed;with bed alarm set  Nurse Communication Mobility status   PT - Assessment/Plan  PT Plan Current plan remains appropriate  PT Visit Diagnosis Pain;Difficulty in walking, not elsewhere classified (R26.2);Muscle weakness (generalized) (M62.81);Unsteadiness on feet (R26.81)  Pain - part of body  (back)  PT Frequency (ACUTE ONLY) Min 3X/week  Follow Up Recommendations CIR;Supervision for mobility/OOB  PT equipment None recommended by PT  AM-PAC PT "6 Clicks" Mobility Outcome Measure (Version 2)  Help needed turning from your back to your side while in a flat bed without using bedrails? 3  Help needed moving from lying on your back to sitting on the side of a flat bed without using bedrails? 2  Help needed moving to and from a bed to a chair (including a wheelchair)? 3  Help needed standing up from a chair using your arms (e.g., wheelchair or bedside chair)? 3  Help needed to walk in hospital room? 3  Help needed climbing 3-5 steps with a railing?  2  6 Click Score 16  Consider Recommendation of Discharge To: Home with [facility]  PT Goal Progression  Progress towards PT goals Progressing toward goals  PT Time Calculation  PT Start Time (ACUTE ONLY) 1023  PT Stop Time (ACUTE ONLY) 1035  PT Time Calculation (min) (ACUTE ONLY) 12 min  PT General Charges  $$ ACUTE PT VISIT 1 Visit  PT Treatments  $Therapeutic Activity 8-22 mins  Pt only able to tolerate sitting in 41 degree chair position for 20 min.  Had to remove brace and get pt positioned on side.  Will continue acute PT.   [place] Pager:  [phone]  Office:  [phone]   

## 2018-12-06 NOTE — Progress Notes (Signed)
Pt refused CT scan of thoracic spine. Wife stated last scan was 13 days prior and pt is in too much pain to go for procedure. MD notified.

## 2018-12-06 NOTE — Progress Notes (Signed)
ANTICOAGULATION CONSULT NOTE - Follow Up Consult  Pharmacy Consult for Heparin Indication: atrial fibrillation  Allergies  Allergen Reactions  . Contrast Media [Iodinated Diagnostic Agents] Rash and Other (See Comments)    Extreme flushing  . Ioxaglate Other (See Comments)    Extreme flushing  . Feraheme [Ferumoxytol] Other (See Comments)    Patient had  reaction during infusion in Feb 2020 decision made to change to [facility] (passed out)  . Iodine Other (See Comments)    flushing  . Seasonal Ic [Cholestatin] Other (See Comments)    Unknown reaction  . Adhesive [Tape] Rash    Adhesive tapes causes redness/itching/rash after removal    Patient Measurements: Height: 6' 1"  (185.4 cm) Weight: (!) 320 lb 5.3 oz (145.3 kg) IBW/kg (Calculated) : 79.9 Heparin Dosing Weight: 114 kg  Vital Signs: Temp: 98 F (36.7 C) (03/18 0413) Temp Source: Oral (03/18 0413) BP: 113/54 (03/18 0413) Pulse Rate: 84 (03/18 0413)  Labs: Recent Labs    12/03/18 2230 12/04/18 0416 12/04/18 1103 12/05/18 0335 12/06/18 0310 12/06/18 1141  HGB  --  10.9*  --  9.8* 10.1*  --   HCT  --  33.3*  --  30.7* 30.7*  --   PLT  --  88*  --  66* 67*  --   APTT  --  70*  --  120*  --   --   HEPARINUNFRC  --  0.50  --  0.40 0.32  --   CREATININE  --  2.36*  --  1.74* 1.45* 1.33*  TROPONINI 0.06* 0.06* 0.05*  --   --   --     Estimated Creatinine Clearance: 69.8 mL/min (A) (by C-G formula based on SCr of 1.33 mg/dL (H)).  Assessment:   78 yr old male with history of atrial fibrillation.  Xarelto on hold, on IV heparin.   IR consulted for possible kyphoplasty. Last Xarelto dose 3/14 at 7pm.  Now using heparin levels for heparin monitoring; aPTTs stopped.      Heparin level is low therapeutic (0.32) on 1500 units/hr. Level was 0.4 on same rate yesterday. Hgb stable, platelet count low stable. No bleeding reported.    Goal of Therapy:  Heparin level 0.3-0.7 units/ml Monitor platelets by anticoagulation  protocol: Yes   Plan:   Increase heparin drip to 1600 units/hr to try to keep levels at goal.  Daily heparin level and CBC.  Xarelto on hold.  Arty Baumgartner, [place] Pager: [phone] or phone: [phone] 12/06/2018,12:40 PM

## 2018-12-06 NOTE — Consult Note (Addendum)
Physical Medicine and Rehabilitation Consult Reason for Consult:  Decreased functional mobility Referring Physician:  [facility]   HPI: Robert Gregory is a 78 y.o.right handed male with past medical history significant for diabetes mellitus,long-standing cirrhosis secondary to hepatic steatosis, stage III CKD, CAD with CABG 1997, AICD placement, hypertension, iron deficiency anemia and pernicious anemia,atrial fibrillation maintained on Xarelto,chronic right lower extremity wound with recent admission in December for cellulitis.Patient recently seeing [name] for low platelet count and recently Dr. Paulita Fujita of gastroenterology for screening colonoscopy with findings of some elevated LFTs. Per chart review patient lives with spouse. Independent  prior to admission. One level home. Presented 12/03/2018 with complaints of being lightheaded with some nausea without vomiting. He had difficulty getting up to the bathroom. Wife checked his blood pressure 88/39 pulse of 30. 911 was called.troponin 0.06, sodium 125, potassium 6.2, creatinine 2.78 from a baseline 1.32, lactic acid 1.8. Chest x-ray negative. Patient found to be in complete heart block with EP cardiology consulted and AICD adjusted. Follow-up oncology services as well as gastroenterology services after a recent CT scan of the abdomen and pelvis completed last week that showed 86 right liver dome mass compatible with hepatocellular carcinoma. He also had a 3.5 cm left adrenal nodule and a 1.1 cm right pericardiophrenic lymph node as well as a T7 compression fracture  Currently interventional radiology does not feel patient is a candidate for vertbroplasty and was placed in a TLSO back brace with CT thoracic spine without contrast pending.. Patient has been treated for right lower extremity cellulitis with antibiotic therapy. Currently plans to follow-up outpatient oncology services to address hepatocellular carcinoma. Workup for dysphagia with  esophagram barium swallow showed nonspecific esophageal motility disorder with extensive tertiary contractions. Patient is currently maintaining a regular consistency diet. Currently on IV heparin awaiting planned to switch back to oral anticoagulation. Therapy evaluations completed with recommendations of physical medicine rehabilitation consult.   Review of Systems  Constitutional: Negative for chills and fever.  HENT: Negative for hearing loss.   Eyes: Negative for blurred vision and double vision.  Respiratory: Positive for shortness of breath. Negative for cough.   Cardiovascular: Positive for palpitations and leg swelling. Negative for chest pain.  Gastrointestinal: Positive for constipation, nausea and vomiting. Negative for heartburn.  Musculoskeletal: Positive for back pain and myalgias.  Skin: Negative for rash.  Neurological: Positive for dizziness, weakness and headaches. Negative for seizures.  Psychiatric/Behavioral: The patient has insomnia.   All other systems reviewed and are negative.  Past Medical History:  Diagnosis Date   Arthritis    Atrial fibrillation ([place])    takes Xarelto daily   Cellulitis    RLE   Cholelithiasis    Chronic back pain    scoliosis/arthritis   Complication of anesthesia    forgetful for about 2-3wks after anesthesia   Hepatocellular carcinoma ([place])    History of colon polyps    Hyperlipidemia    takes Zetia,Pravastatin,and Tricor daily   Hypertension    takes Benicar and Coreg daily   ICD (implantable cardioverter-defibrillator) in place 11/07/2013   Iron deficiency anemia due to chronic blood loss 08/24/2018   Iron malabsorption 08/24/2018   Ischemic cardiomyopathy    Myocardial infarction ([place]) 1997   on the OR table    Nephrolithiasis    Obstructive sleep apnea    CPAP   Peripheral neuropathy    Pernicious anemia 08/24/2018   Spinal stenosis    Thrombocytopenia ([place])  Total knee replacement status  11/29/2014   Type 2 diabetes mellitus with stage 3 chronic kidney disease ([place]) 05/19/2015   Past Surgical History:  Procedure Laterality Date   CARDIAC CATHETERIZATION N/A 10/02/2015   Procedure: Left Heart Cath and Cors/Grafts Angiography;  Surgeon: Belva Crome, MD;  Location: [facility];  Service: Cardiovascular;  Laterality: N/A;   COLONOSCOPY     coronary artery bypass and graft  1997   x 5   ESOPHAGOGASTRODUODENOSCOPY     ICD placed     LAMINECTOMY     LITHOTRIPSY     Right rotator cuff surgery     TONSILLECTOMY     adenoids   TOTAL KNEE ARTHROPLASTY Left    TOTAL KNEE ARTHROPLASTY Right 11/29/2014   Procedure: RIGHT TOTAL KNEE ARTHROPLASTY;  Surgeon: Leandrew Koyanagi, MD;  Location: [facility];  Service: Orthopedics;  Laterality: Right;   Family History  Problem Relation Age of Onset   Pneumonia Mother    Heart disease Other        No family history   Social History:  reports that he has never smoked. He has never used smokeless tobacco. He reports current alcohol use. He reports that he does not use drugs. Allergies:  Allergies  Allergen Reactions   Contrast Media [Iodinated Diagnostic Agents] Rash and Other (See Comments)    Extreme flushing   Ioxaglate Other (See Comments)    Extreme flushing   Feraheme [Ferumoxytol] Other (See Comments)    Patient had  reaction during infusion in Feb 2020 decision made to change to [facility] (passed out)   Iodine Other (See Comments)    flushing   Seasonal Ic [Cholestatin] Other (See Comments)    Unknown reaction   Adhesive [Tape] Rash    Adhesive tapes causes redness/itching/rash after removal   Medications Prior to Admission  Medication Sig Dispense Refill   acetaminophen (TYLENOL) 500 MG tablet Take 1,000 mg by mouth every 6 (six) hours as needed for headache (pain).     BD INSULIN SYRINGE U-500 31G X 6MM 0.5 ML MISC USE 1 SYRINGE TO INJECT UNDER THE SKIN THREE TIMES A DAY BEFORE MEALS (Patient taking  differently: Inject into the skin 2 (two) times daily. ) 300 each 3   BENICAR 40 MG tablet TAKE 1 TABLET TWICE A DAY (Patient taking differently: Take 40 mg by mouth 2 (two) times daily. ) 180 tablet 3   carvedilol (COREG) 6.25 MG tablet Take 1 tablet (6.25 mg total) by mouth 2 (two) times daily with a meal. 90 tablet 3   Cholecalciferol (VITAMIN D) 50 MCG (2000 UT) tablet Take 2,000 Units by mouth daily with supper.      Coenzyme Q10 200 MG capsule Take 200 mg by mouth daily with supper.      ezetimibe (ZETIA) 10 MG tablet TAKE 1 TABLET DAILY (Patient taking differently: Take 10 mg by mouth daily. ) 90 tablet 4   furosemide (LASIX) 40 MG tablet Take 40 mg by mouth daily.     insulin regular human CONCENTRATED (HUMULIN R) 500 UNIT/ML injection Inject up to 200 units a day as advised (Patient taking differently: Inject 35-150 Units into the skin See admin instructions. Inject 150 units subcutaneously with breakfast (drawn on insulin syringe) and 70 units with lunch. For CBG 100-120 decrease to 1/2 dose (75 units with breakfast and 35 units with lunch. If CBG <100 hold insulin.) 3 vial 3   Levomefolate Glucosamine (METHYLFOLATE PO) Take 2,000 mcg by  mouth daily with supper.     metFORMIN (GLUCOPHAGE-XR) 500 MG 24 hr tablet Take 1000 mg with lunch (Patient taking differently: Take 1,000 mg by mouth daily with lunch. ) 180 tablet 3   methocarbamol (ROBAXIN) 750 MG tablet Take 375 mg by mouth every 8 (eight) hours as needed for muscle spasms (back pain).      Multiple Vitamin (MULTIVITAMIN WITH MINERALS) TABS tablet Take 1 tablet by mouth daily. Centrum Silver     nitroGLYCERIN (NITROSTAT) 0.4 MG SL tablet Place 1 tablet (0.4 mg total) under the tongue every 5 (five) minutes as needed for chest pain. 75 tablet 3   omega-3 acid ethyl esters (LOVAZA) 1 g capsule TAKE 2 CAPSULES TWICE A DAY (Patient taking differently: Take 2 g by mouth 2 (two) times daily. ) 360 capsule 1    oxyCODONE-acetaminophen (PERCOCET) 7.5-325 MG tablet Take 1 tablet by mouth every 8 (eight) hours as needed for severe pain.     Peppermint Oil (IBGARD) 90 MG CPCR Take 90 mg by mouth 2 (two) times daily.     potassium chloride (K-DUR) 10 MEQ tablet Take 10 mEq by mouth daily with breakfast.      pravastatin (PRAVACHOL) 80 MG tablet TAKE 1 TABLET DAILY (Patient taking differently: Take 80 mg by mouth at bedtime. ) 90 tablet 3   Probiotic Product (PROBIOTIC DAILY PO) Take 1 tablet by mouth 2 (two) times daily.      spironolactone (ALDACTONE) 25 MG tablet Take 25 mg by mouth daily.     TRICOR 48 MG tablet TAKE 1 TABLET DAILY (Patient taking differently: Take 48 mg by mouth daily. ) 90 tablet 4   XARELTO 20 MG TABS tablet TAKE 1 TABLET DAILY WITH SUPPER (Patient taking differently: Take 20 mg by mouth daily with supper. ) 90 tablet 3    Home: Home Living Family/patient expects to be discharged to:: Private residence Living Arrangements: Spouse/significant other Available Help at Discharge: Family, Available 24 hours/day Type of Home: House Home Access: Level entry Home Layout: One level Bathroom Shower/Tub: Public librarian, Multimedia programmer: Standard Home Equipment: Environmental consultant - 2 wheels, [employer] - single point, Civil engineer, contracting - built in, [employer] - tub/shower, Loss adjuster, chartered History: Prior Function Level of Independence: Independent with assistive device(s) Comments: Doing own dressing/bathing. Wife helps dry feet. Uses RW vs SPC for ambulation. Functional Status:  Mobility: Bed Mobility Overal bed mobility: Needs Assistance Bed Mobility: Sidelying to Sit Sidelying to sit: Mod assist, HOB elevated General bed mobility comments: Pt already in sidelying; Mod A to elevate trunk to get to EOB. Cues for log roll technique. Transfers Overall transfer level: Needs assistance Equipment used: Rolling walker (2 wheeled) Transfers: Sit to/from Stand Sit to Stand: Min  assist, From elevated surface General transfer comment: Min A to steady in standing; able to stand wtih elevated bed height and pushing through UEs. Cues for hand placement/technique. Stood from [employer], from chair x2, transferred torecliner.  Ambulation/Gait Ambulation/Gait assistance: Min assist Gait Distance (Feet): 25 Feet(+ 15') Assistive device: Rolling walker (2 wheeled) Gait Pattern/deviations: Step-through pattern, Decreased stride length, Wide base of support, Shuffle General Gait Details: Slow, unsteady gait with min A for balance/safety. Pt incontinent of urine standing at sink. HR stable. 2/4 DOE.  Gait velocity: decreased    ADL: ADL Overall ADL's : Needs assistance/impaired Eating/Feeding: Independent, Sitting Grooming: Minimal assistance, Oral care, Standing Grooming Details (indicate cue type and reason): Pt performing oral care with Min A for  standing balance at sink. Pt with difficulty performing bilateral tasks and maintaining balance without atleast one UE for support.  Upper Body Bathing: Minimal assistance, Sitting Lower Body Bathing: Moderate assistance, Sit to/from stand Lower Body Bathing Details (indicate cue type and reason): Pt with urinary incontience at the sink and required assistance to wash BLEs while seated. Pt able to stable and perform peri care with cues to bend at his knees. Upper Body Dressing : Minimal assistance, Sitting, Maximal assistance Upper Body Dressing Details (indicate cue type and reason): Max A for donning brace Lower Body Dressing: Maximal assistance, Sit to/from stand Lower Body Dressing Details (indicate cue type and reason): Max A to don/doff socks Toilet Transfer: Minimal assistance, +2 for safety/equipment, Ambulation(Simulated to recliner) Functional mobility during ADLs: Minimal assistance, +2 for safety/equipment, Rolling walker General ADL Comments: Pt with decreased performance of LB ADLs and standing  tasks.  Cognition: Cognition Overall Cognitive Status: Within Functional Limits for tasks assessed Orientation Level: Oriented X4 Cognition Arousal/Alertness: Awake/alert Behavior During Therapy: WFL for tasks assessed/performed Overall Cognitive Status: Within Functional Limits for tasks assessed General Comments: for basic mobility tasks.  Blood pressure (!) 113/54, pulse 84, temperature 98 F (36.7 C), temperature source Oral, resp. rate 18, height 6' 1"  (1.854 m), weight (!) 145.3 kg, SpO2 96 %. Physical Exam  HENT:  Head: Normocephalic.  Eyes: EOM are normal.  Neck: Normal range of motion. Neck supple. No thyromegaly present.  Cardiovascular:  Cardiac rate controlled  Respiratory: Effort normal. No respiratory distress.  Neurological:  Patient is alert. CPAP was in place. Follows full commands. Provides name and age.  Skin:  Right lower extremity wound is dressed  Mild dyspnea, no cough, Chest ICD palpable no tenderness around the chest wall.  There is tenderness around the mid thoracic area to palpation in the midline. Motor strength is 5/5 bilateral deltoid bicep tricep grip 4- at bilateral hip flexors knee extensors ankle dorsiflexor Sensation intact to light touch bilateral upper and lower limb.  Results for orders placed or performed during the hospital encounter of 12/03/18 (from the past 24 hour(s))  Glucose, capillary     Status: Abnormal   Collection Time: 12/05/18  7:40 AM  Result Value Ref Range   Glucose-Capillary 58 (L) 70 - 99 mg/dL  Glucose, capillary     Status: None   Collection Time: 12/05/18  8:18 AM  Result Value Ref Range   Glucose-Capillary 97 70 - 99 mg/dL  Glucose, capillary     Status: Abnormal   Collection Time: 12/05/18 11:32 AM  Result Value Ref Range   Glucose-Capillary 131 (H) 70 - 99 mg/dL  Glucose, capillary     Status: Abnormal   Collection Time: 12/05/18  4:23 PM  Result Value Ref Range   Glucose-Capillary 202 (H) 70 - 99 mg/dL   Glucose, capillary     Status: Abnormal   Collection Time: 12/05/18  5:35 PM  Result Value Ref Range   Glucose-Capillary 185 (H) 70 - 99 mg/dL  Heparin level (unfractionated)     Status: None   Collection Time: 12/06/18  3:10 AM  Result Value Ref Range   Heparin Unfractionated 0.32 0.30 - 0.70 IU/mL  CBC     Status: Abnormal   Collection Time: 12/06/18  3:10 AM  Result Value Ref Range   WBC 6.0 4.0 - 10.5 K/uL   RBC 3.30 (L) 4.22 - 5.81 MIL/uL   Hemoglobin 10.1 (L) 13.0 - 17.0 g/dL   HCT 30.7 (L) 39.0 -  52.0 %   MCV 93.0 80.0 - 100.0 fL   MCH 30.6 26.0 - 34.0 pg   MCHC 32.9 30.0 - 36.0 g/dL   RDW 18.6 (H) 11.5 - 15.5 %   Platelets 67 (L) 150 - 400 K/uL   nRBC 0.0 0.0 - 0.2 %  Comprehensive metabolic panel     Status: Abnormal   Collection Time: 12/06/18  3:10 AM  Result Value Ref Range   Sodium 135 135 - 145 mmol/L   Potassium 5.2 (H) 3.5 - 5.1 mmol/L   Chloride 107 98 - 111 mmol/L   CO2 19 (L) 22 - 32 mmol/L   Glucose, Bld 224 (H) 70 - 99 mg/dL   BUN 36 (H) 8 - 23 mg/dL   Creatinine, Ser 1.45 (H) 0.61 - 1.24 mg/dL   Calcium 9.0 8.9 - 10.3 mg/dL   Total Protein 6.5 6.5 - 8.1 g/dL   Albumin 4.0 3.5 - 5.0 g/dL   AST 55 (H) 15 - 41 U/L   ALT 65 (H) 0 - 44 U/L   Alkaline Phosphatase 110 38 - 126 U/L   Total Bilirubin 1.5 (H) 0.3 - 1.2 mg/dL   GFR calc non Af Amer 46 (L) >60 mL/min   GFR calc Af Amer 53 (L) >60 mL/min   Anion gap 9 5 - 15   Dg Esophagus W Single Cm (sol Or Thin Ba)  Result Date: 12/05/2018 CLINICAL DATA:  78 year old male with history of difficulty swallowing. Unable to belt. EXAM: ESOPHOGRAM/BARIUM SWALLOW TECHNIQUE: Single contrast examination was performed using  thin barium. FLUOROSCOPY TIME:  Fluoroscopy Time:  42 seconds Radiation Exposure Index (if provided by the fluoroscopic device): 9.9 mGy COMPARISON:  None. FINDINGS: Single contrast imaging of the esophagus was performed due to limited patient mobility. Within the limitations of the examination,  the esophagus appears structurally normal, without definite esophageal mass, stricture or esophageal ring. No hiatal hernia. During real-time observation multiple single swallow attempts fail to fully propagate, and extensive tertiary contractions were observed throughout the examination. IMPRESSION: 1. Nonspecific esophageal motility disorder with extensive tertiary contractions. Electronically Signed   By: Vinnie Langton M.D.   On: 12/05/2018 14:14     Assessment/Plan: Diagnosis: Deconditioning secondary to T7 fracture, hepatocellular carcinoma, and cardiac dysrhythmia 1. Does the need for close, 24 hr/day medical supervision in concert with the patient's rehab needs make it unreasonable for this patient to be served in a less intensive setting? Yes 2. Co-Morbidities requiring supervision/potential complications: Morbid obesity, history of hepatic cirrhosis, esophageal motility disorder, atrial fibrillation on Xarelto 3. Due to bladder management, bowel management, safety, skin/wound care, disease management, medication administration, pain management and patient education, does the patient require 24 hr/day rehab nursing? Yes 4. Does the patient require coordinated care of a physician, rehab nurse, PT (1-2 hrs/day, 5 days/week) and OT (1-2 hrs/day, 5 days/week) to address physical and functional deficits in the context of the above medical diagnosis(es)? Yes Addressing deficits in the following areas: balance, endurance, locomotion, strength, transferring, bowel/bladder control, bathing, dressing, feeding, grooming, toileting and psychosocial support 5. Can the patient actively participate in an intensive therapy program of at least 3 hrs of therapy per day at least 5 days per week? Not currently, patient has uncontrolled pain as well as dyspnea 6. The potential for patient to make measurable gains while on inpatient rehab is fair 7. Anticipated functional outcomes upon discharge from inpatient  rehab are Once patient is able to participate with program should be able to  achieve supervision with mobility  with PT, Once able to participate should be able to achieve supervision level with ADLs with OT, n/a with SLP. 8. Estimated rehab length of stay to reach the above functional goals is: 10 to 14 days 9. Anticipated D/C setting: Home 10. Anticipated post D/C treatments: [place] therapy 11. Overall Rehab/Functional Prognosis: fair  RECOMMENDATIONS: This patient's condition is appropriate for continued rehabilitative care in the following setting: Currently not ready for rehab due to poor activity tolerance as well as dyspnea Patient has agreed to participate in recommended program. Yes Note that insurance prior authorization may be required for reimbursement for recommended care.  Comment: Need to address dyspnea, need for vertebroplasty prior to rehab, CIR "I have personally performed a face to face diagnostic evaluation of this patient.  Additionally, I have reviewed and concur with the physician assistant's documentation above." Charlett Blake M.D. [facility] FAAPM&R (Sports Med, Neuromuscular Med) Diplomate Am Board of Electrodiagnostic Med  Elizabeth Sauer 12/06/2018

## 2018-12-06 NOTE — Progress Notes (Signed)
Inpatient Rehabilitation-Admissions Coordinator   Please see PM&R consult note by Dr. Alysia Penna for details. Pt currently unable to tolerate CIR program due poor activity tolerance and dyspnea. If the pt needs a vertebroplasty, it would need to be completed prior to rehab. AC will follow for completion of medical workup to determine if pt able to tolerate CIR program once medically ready.   Please call if questions.   Jhonnie Garner, OTR/L  Rehab Admissions Coordinator  [phone] 12/06/2018 3:44 PM

## 2018-12-06 NOTE — Progress Notes (Signed)
OT Cancellation Note  Patient Details Name: Robert Gregory MRN: [id number] DOB: 01-27-1941   Cancelled Treatment:    Reason Eval/Treat Not Completed: Patient declined, no reason specified Re-visited pt to reattempt OT visit, pt declined OT session this date stating "I've given it all I've got today". Pt requested OT return tomorrow. Will return if appropriate and if time allows.   Dorinda Hill OTR/L Acute Rehabilitation Services Office: [place] 12/06/2018, 1:37 PM

## 2018-12-06 NOTE — Progress Notes (Signed)
Inpatient Diabetes Program Recommendations  AACE/ADA: New Consensus Statement on Inpatient Glycemic Control (2015)  Target Ranges:  Prepandial:   less than 140 mg/dL      Peak postprandial:   less than 180 mg/dL (1-2 hours)      Critically ill patients:  140 - 180 mg/dL   Lab Results  Component Value Date   GLUCAP 219 (H) 12/06/2018   HGBA1C 7.0 (A) 08/24/2018    Review of Glycemic Control  Having hypos with U-500 insulin. Order changed to 50% home dose, and again changed to s/s tidwc.  Give 5 units for CBG above 150-200  Give 10 U for CBG 201-250  Give 12 U for CBG 251-300  Give 15 U for CBG 301-350  Give 18 U for CBG 351-400  Give 20 U for CBG 401-450   Blood sugars in 185-224 mg/dL. Has not received any U-500 insulin today or 3/17. Pt has been refusing U-500 insulin.  Inpatient Diabetes Program Recommendations:     Will follow closely and make recs as needed.  Thank you. Lorenda Peck, RD, LDN, CDE Inpatient Diabetes Coordinator [phone]

## 2018-12-07 ENCOUNTER — Ambulatory Visit
Admit: 2018-12-07 | Discharge: 2018-12-07 | Disposition: A | Discharge status: Home / Self Care | Payer: Medicare Other | Attending: Radiation Oncology | Admitting: Radiation Oncology

## 2018-12-07 DIAGNOSIS — M546 Pain in thoracic spine: Secondary | ICD-10-CM

## 2018-12-07 DIAGNOSIS — N183 Chronic kidney disease, stage 3 (moderate): Secondary | ICD-10-CM

## 2018-12-07 DIAGNOSIS — G893 Neoplasm related pain (acute) (chronic): Secondary | ICD-10-CM

## 2018-12-07 DIAGNOSIS — Z794 Long term (current) use of insulin: Secondary | ICD-10-CM

## 2018-12-07 DIAGNOSIS — C7951 Secondary malignant neoplasm of bone: Secondary | ICD-10-CM

## 2018-12-07 DIAGNOSIS — C220 Liver cell carcinoma: Secondary | ICD-10-CM

## 2018-12-07 DIAGNOSIS — R1310 Dysphagia, unspecified: Secondary | ICD-10-CM

## 2018-12-07 LAB — BASIC METABOLIC PANEL
Anion gap: 9 (ref 5–15)
BUN: 33 mg/dL — ABNORMAL HIGH (ref 8–23)
CO2: 19 mmol/L — ABNORMAL LOW (ref 22–32)
CREATININE: 1.2 mg/dL (ref 0.61–1.24)
Calcium: 9 mg/dL (ref 8.9–10.3)
Chloride: 106 mmol/L (ref 98–111)
GFR calc Af Amer: >60 mL/min (ref >60)
GFR calc non Af Amer: 58 mL/min — ABNORMAL LOW (ref >60)
Glucose, Bld: 226 mg/dL — ABNORMAL HIGH (ref 70–99)
Potassium: 4.8 mmol/L (ref 3.5–5.1)
Sodium: 134 mmol/L — ABNORMAL LOW (ref 135–145)

## 2018-12-07 LAB — HEPARIN LEVEL (UNFRACTIONATED): Heparin Unfractionated: 0.26 IU/mL — ABNORMAL LOW (ref 0.30–0.70)

## 2018-12-07 LAB — GLUCOSE, CAPILLARY
Glucose-Capillary: 189 mg/dL — ABNORMAL HIGH (ref 70–99)
Glucose-Capillary: 197 mg/dL — ABNORMAL HIGH (ref 70–99)
Glucose-Capillary: 199 mg/dL — ABNORMAL HIGH (ref 70–99)
Glucose-Capillary: 260 mg/dL — ABNORMAL HIGH (ref 70–99)

## 2018-12-07 LAB — CBC
HCT: 31.6 % — ABNORMAL LOW (ref 39.0–52.0)
Hemoglobin: 9.9 g/dL — ABNORMAL LOW (ref 13.0–17.0)
MCH: 29.9 pg (ref 26.0–34.0)
MCHC: 31.3 g/dL (ref 30.0–36.0)
MCV: 95.5 fL (ref 80.0–100.0)
Platelets: 67 10*3/uL — ABNORMAL LOW (ref 150–400)
RBC: 3.31 MIL/uL — ABNORMAL LOW (ref 4.22–5.81)
RDW: 18.6 % — ABNORMAL HIGH (ref 11.5–15.5)
WBC: 5.2 10*3/uL (ref 4.0–10.5)
nRBC: 0 % (ref 0.0–0.2)

## 2018-12-07 MED ORDER — OXYCODONE HCL 5 MG PO TABS
5.0000 mg | ORAL_TABLET | ORAL | Status: DC | PRN
  Administered 2018-12-07 – 2018-12-09 (×4): 5 mg via ORAL
  Filled 2018-12-07 (×4): qty 1

## 2018-12-07 MED ORDER — POLYETHYLENE GLYCOL 3350 17 G PO PACK
17.0000 g | PACK | Freq: Every day | ORAL | Status: DC
  Administered 2018-12-07 – 2018-12-12 (×6): 17 g via ORAL
  Filled 2018-12-07 (×5): qty 1

## 2018-12-07 MED ORDER — INSULIN ASPART 100 UNIT/ML ~~LOC~~ SOLN: 0.0000 [IU] | Freq: Every day | SUBCUTANEOUS | Status: DC

## 2018-12-07 MED ORDER — MORPHINE SULFATE 2 MG/ML IJ SOLN: 1.0000 mg | INTRAMUSCULAR | Status: DC | PRN

## 2018-12-07 MED ORDER — INSULIN ASPART 100 UNIT/ML ~~LOC~~ SOLN
0.0000 [IU] | Freq: Three times a day (TID) | SUBCUTANEOUS | Status: DC
  Administered 2018-12-08: 7 [IU] via SUBCUTANEOUS
  Administered 2018-12-09: 4 [IU] via SUBCUTANEOUS
  Administered 2018-12-09: 7 [IU] via SUBCUTANEOUS

## 2018-12-07 MED ORDER — OXYCODONE-ACETAMINOPHEN 7.5-325 MG PO TABS
1.0000 | ORAL_TABLET | ORAL | Status: DC | PRN
  Administered 2018-12-07: 1 via ORAL
  Filled 2018-12-07: qty 1

## 2018-12-07 MED ORDER — MORPHINE SULFATE (PF) 2 MG/ML IV SOLN
1.0000 mg | INTRAVENOUS | Status: DC | PRN
  Administered 2018-12-11 – 2018-12-12 (×2): 1 mg via INTRAVENOUS
  Filled 2018-12-07 (×2): qty 1

## 2018-12-07 MED ORDER — GABAPENTIN 300 MG PO CAPS
300.0000 mg | ORAL_CAPSULE | Freq: Three times a day (TID) | ORAL | Status: DC
  Administered 2018-12-07 – 2018-12-16 (×28): 300 mg via ORAL
  Filled 2018-12-07 (×29): qty 1

## 2018-12-07 MED ORDER — POLYETHYLENE GLYCOL 3350 17 G PO PACK: 17.0000 g | PACK | Freq: Every day | ORAL | Status: DC | PRN

## 2018-12-07 NOTE — Care Management Important Message (Signed)
Important Message  Patient Details  Name: Robert Gregory MRN: [id number] Date of Birth: [birth year]   Medicare Important Message Given:  Yes    Kearney Evitt 12/07/2018, 1:44 PM

## 2018-12-07 NOTE — Progress Notes (Signed)
Inpatient Diabetes Program Recommendations  AACE/ADA: New Consensus Statement on Inpatient Glycemic Control (2015)  Target Ranges:  Prepandial:   less than 140 mg/dL      Peak postprandial:   less than 180 mg/dL (1-2 hours)      Critically ill patients:  140 - 180 mg/dL   Results for SABAN, HEINLEN" (MRN [id number]) as of 12/07/2018 10:15  Ref. Range 12/06/2018 07:47 12/06/2018 11:46 12/06/2018 14:37 12/06/2018 16:14 12/06/2018 22:32 12/07/2018 08:12  Glucose-Capillary Latest Ref Range: 70 - 99 mg/dL 185 (H) 270 (H) 207 (H) 219 (H) 263 (H) 189 (H)    Review of Glycemic Control  Having hypos with U-500 insulin. Order changed to 50% home dose, and again changed to s/s tidwc. Pt has been refusing U-500 insulin still.  Inpatient Diabetes Program Recommendations:     Discontinue U-500 insulin and order Novolog 0-15 units tid + Novolog HS scale.  Thank you. Tama Headings RN, MSN, BC-ADM Inpatient Diabetes Coordinator Team Pager [phone] (8a-5p)

## 2018-12-07 NOTE — Progress Notes (Addendum)
HEMATOLOGY-ONCOLOGY PROGRESS NOTE  SUBJECTIVE: Patient having ongoing back pain.  Has remained afebrile.  Denies chest discomfort and shortness of breath.  Reports constipation.  Denies nausea, vomiting.  Had bleeding last evening from his groin which resolved with pressure.  Heparin was stopped briefly. No recurrent bleeding noted.  REVIEW OF SYSTEMS:   Constitutional: Denies fevers, chills or abnormal weight loss Eyes: Denies blurriness of vision Ears, nose, mouth, throat, and face: Denies mucositis or sore throat Respiratory: Denies cough, dyspnea or wheezes Cardiovascular: Denies palpitation, chest discomfort Gastrointestinal:  Denies nausea, heartburn or change in bowel habits Skin: Ongoing cellulitis to his right lower extremity. Lymphatics: Denies new lymphadenopathy Heme: Had bleeding from his groin last night which stopped with pressure.  Heparin was held briefly. Musculoskeletal: He has persistent back pain. Neurological:Denies numbness, tingling or new weaknesses Behavioral/Psych: Mood is stable, no new changes  Extremities: Reports bilateral lower extremity edema. All other systems were reviewed with the patient and are negative.  I have reviewed the past medical history, past surgical history, social history and family history with the patient and they are unchanged from previous note.   PHYSICAL EXAMINATION:  Vitals:   12/07/18 0204 12/07/18 0613  BP: 128/65 129/70  Pulse: 75 86  Resp: (!) 25   Temp: 97.7 F (36.5 C)   SpO2: 93% 97%   Filed Weights   12/05/18 0546 12/06/18 0419 12/07/18 0613  Weight: (!) 310 lb 12.8 oz (141 kg) (!) 320 lb 5.3 oz (145.3 kg) (!) 318 lb 12.8 oz (144.6 kg)    GENERAL:alert, no distress and comfortable SKIN: skin color, texture, turgor are normal.  Improving cellulitis of the right lower extremity. EYES: normal, Conjunctiva are pink and non-injected, sclera clear OROPHARYNX:no exudate, no erythema and lips, buccal mucosa, and tongue  normal  NECK: supple, thyroid normal size, non-tender, without nodularity LYMPH:  no palpable lymphadenopathy in the cervical, axillary or inguinal LUNGS: clear to auscultation and percussion with normal breathing effort HEART: regular rate & rhythm and no murmurs.  Trace bilateral lower extremity edema. ABDOMEN:abdomen soft, non-tender and normal bowel sounds Musculoskeletal:no cyanosis of digits and no clubbing  NEURO: alert & oriented x 3 with fluent speech, no focal motor/sensory deficits  LABORATORY DATA:  I have reviewed the data as listed CMP Latest Ref Rng & Units 12/07/2018 12/06/2018 12/06/2018  Glucose 70 - 99 mg/dL 226(H) 222(H) 246(H)  BUN 8 - 23 mg/dL 33(H) 35(H) 36(H)  Creatinine 0.61 - 1.24 mg/dL 1.20 1.30(H) 1.37(H)  Sodium 135 - 145 mmol/L 134(L) 135 134(L)  Potassium 3.5 - 5.1 mmol/L 4.8 5.7(H) 5.6(H)  Chloride 98 - 111 mmol/L 106 108 108  CO2 22 - 32 mmol/L 19(L) 20(L) 16(L)  Calcium 8.9 - 10.3 mg/dL 9.0 8.8(L) 8.9  Total Protein 6.5 - 8.1 g/dL - - -  Total Bilirubin 0.3 - 1.2 mg/dL - - -  Alkaline Phos 38 - 126 U/L - - -  AST 15 - 41 U/L - - -  ALT 0 - 44 U/L - - -    Lab Results  Component Value Date   WBC 5.2 12/07/2018   HGB 9.9 (L) 12/07/2018   HCT 31.6 (L) 12/07/2018   MCV 95.5 12/07/2018   PLT 67 (L) 12/07/2018   NEUTROABS 3.2 12/05/2018    ASSESSMENT AND PLAN: Hepatocellular carcinoma: AFP is greater than 3400.  GI has seen the patient and says that there was no need for liver biopsy at this time as CT scan is diagnostic for  definitive hepatocellular carcinoma. Further treatment will be discussed as an outpatient per [name].  T7 fracture Could be pathologic.  Patient with severe symptoms which have really affected his functional status.  Ongoing for the past 4 weeks.  He had a CT scan of his thoracic spine done on 3/10.  CT scan of thoracic spine was performed yesterday which showed progression of the destruction of the pathologic fracture of T7  since the prior CT scan on 11/27/2018.  The partially destroyed posterior margin of T7-8 protrudes slightly into the spinal canal.  There is unchanged tumor infiltration of the majority of the T8 vertebral body without a pathologic fracture.  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  He was noted to have a progressive small right pleural effusion and a new left pleural effusion and enlarging mediastinal lymph nodes.  The CT report indicates that there was no definitive tumor within the spinal canal although sensitivity was limited due to the lack of contrast.  The patient was evaluated interventional radiology who advised against kyphoplasty/vertebroplasty due to high risk of complications.  A neurosurgery consultation was recommended for possible fusion.  They have also seen the patient who have recommended against surgery due to high risk of complications.  They are trying to see if they can find a better back brace for the patient.  Dr. Benay Spice saw this patient earlier this morning and has asked radiation oncology to review his scans for consideration of palliative radiation to T7/T8.  Awaiting their recommendations.  For pain control, his pain medication was adjusted already this morning to oxycodone/APAP 7.5/325 mg every 4 hours as needed for pain.  I have talked with him about decreasing this to oxycodone/APAP 5 mg / 325 mg every 4 hours as needed for pain due to sedation however he wishes to stay with with the higher dose at this time.  Recurrent right lower extremity cellulitis Remains on cefazolin with improvement in his cellulitis.  May transition to oral antibiotics in the next day or two.  ARF with hyperkalemia Baseline creatinine about 1.3.  His creatinine was noted to be elevated at admission. Medications were adjusted.  Creatinine has improved.  Potassium level is decreasing.  Management per hospitalist.  Normocytic anemia and thrombocytopenia Hemoglobin overall remained  stable and is 9.9 today.  Platelets are stable at 67,000. He had an episode of bleeding last night which stopped quickly.  Heparin was stopped briefly.  No recurrent bleeding noted.  Atrial fibrillation Chads vascular score greater than 4.  Patient was on Xarelto prior to admission. Currently on IV heparin.  Can transition back to Xarelto after vertebroplasty and if no other procedures are planned.  Constipation Continue Colace twice a day.  I have changed MiraLAX from once daily as needed to a scheduled daily dose.  Mikey Bussing, DNP, AGPCNP-BC, AOCNP [name] underwent a thoracic spine CT yesterday.  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  He has been evaluated by neurosurgery and is not a surgical candidate.  He is also not a candidate for a vertebroplasty.  I discussed the case with Dr. Sondra Come.  He will begin palliative radiation to the thoracic spine.  Recommendations: 1.  Discontinue heparin and resume Xarelto if no surgical procedure planned 2.  Proceed with palliative radiation, transfer to [facility] long oncology unit if a prolonged course of radiation is planned 3.  Continue oxycodone as needed for pain, consider adding a long-acting narcotic if he requires  frequent oxycodone 4.  Physical therapy/brace per Dr. Saintclair Halsted 5.  Outpatient follow-up will be scheduled at the [facility] with the plan to begin systemic therapy at the completion of radiation

## 2018-12-07 NOTE — Consult Note (Signed)
Radiation Oncology         [phone] ________________________________  Name: Robert Gregory        MRN: [id number]  Date of Service: 12/07/2018 DOB: August 08, 1941  KG:SUPJSR, Jori Moll, MD  No ref. provider found     REFERRING PHYSICIAN: Dr. Benay Spice  DIAGNOSIS: The primary encounter diagnosis was Complete heart block ([place]). Diagnoses of Acute kidney injury ([place]), Hyperkalemia, Back pain, Hepatocellular carcinoma ([place]), Dysphagia, Chronic back pain, unspecified back location, unspecified back pain laterality, and Constipation, unspecified constipation type were also pertinent to this visit.   HISTORY OF PRESENT ILLNESS: Robert Gregory is a 78 y.o. male seen at the request of Dr. Benay Spice for consideration of palliative radiotherapy to the thoracic spine. The patient is an established patient of [name] with a history of chronic thrombocytopenia secondary to splenomegaly, who was being followed in surveillance. During a routine CT scan in July 2019, he was found to have persistent splenomegaly, evidence of gallstones, and moderate to market cirrhosis with caudate lobe enlargement and a heterogeneous hepatic capsule.  There was ill-defined hypoattenuation within the right hepatic lobe and a possible area of vague hyperattenuation or hyperenhancement within the most cephalad aspect of the right hepatic lobe measuring 4.3 x 4.1 cm.  Given the description it was recommended that he have a repeat scan, and the patient could not undergo MRI scan because of his pacemaker.  On his repeat scan on 05/26/2018 persistent hepato-splenomegaly was noted measuring 14 x 8 cm.  The heterogeneous hypoperfusion pattern in the dome of the right hepatic lobe was unchanged without any discrete enhancing lesions.  He did undergo evaluation with GI, and an ultrasound of the liver on 08/28/2018 revealed no evidence of ascites.  A more recent ultrasound on 11/21/2018 revealed gallstones in his gallbladder the largest measuring 1.3 cm,  and normal-appearing common bile duct measuring 4.3 mm, and a very irregular liver contour with coarse heterogeneous echogenicity consistent with cirrhotic change.  There were scattered echogenic areas in the liver consistent with focal fat but could not exclude neoplastic processes.  The largest measured 6.2 x 3.2 x 3.4 cm.  A CT on 12/01/2018 of the abdomen with and without contrast revealed a mildly enlarged 1.1 cm right peri-cardiophrenic node, and diffusely irregular liver surface compatible with her right hepatic cirrhosis with an 8 x 6 cm right liver dome mass with mild arterial phase hyperenhancement favored to be increased from prior with an LI RADS category 5 classification.  Surrounding heterogeneous enhancement and scattered subcentimeter nodules in the right liver were stable.  He also had a 3.5 cm left adrenal nodule concerning for metastatic disease along with the pericardiophrenic adenopathy.  Of note he had also been evaluated by orthopedics in [place] on 11/28/2018 and had a CT of thoracic and lumbar spine that revealed moderate arthritic disease and no disease in the lumbar spine, but in the thoracic spine, he had compression fracture at T7, scoliosis and kyphotic changes.  He presented 2 days later to the ER complaining of dizziness.  In addition he had consistent changes with cellulitis in the lower extremity, heart block, and has renal insufficiency.  He has been experiencing terrible midthoracic back pain, and a CT of the thoracic spine without contrast yesterday revealed a tumor destroying T7 and T8 vertebral bodies with concern for pathologic fracture of T7 extending into the posterior margin of T7 with slight protrusion of the lesion into the spinal canal, there was also paraspinal tumor around T7 and  T8.  The lack of contrast did limit the limits of being able to completely assess this.  There was a tiny left pleural effusion and small right pleural effusion.  Persistence of the  precarinal lymph node measuring 11 mm and left periaortic node was seen measuring 11 mm as well.  He is not a candidate for vertebral augmentation, and is considered high risk for surgical intervention.  We are asked to see the patient to discuss options of palliative radiotherapy.    PREVIOUS RADIATION THERAPY: No   PAST MEDICAL HISTORY:  Past Medical History:  Diagnosis Date   Arthritis    Atrial fibrillation ([place])    takes Xarelto daily   Cellulitis    RLE   Cholelithiasis    Chronic back pain    scoliosis/arthritis   Complication of anesthesia    forgetful for about 2-3wks after anesthesia   Hepatocellular carcinoma ([place])    History of colon polyps    Hyperlipidemia    takes Zetia,Pravastatin,and Tricor daily   Hypertension    takes Benicar and Coreg daily   ICD (implantable cardioverter-defibrillator) in place 11/07/2013   Iron deficiency anemia due to chronic blood loss 08/24/2018   Iron malabsorption 08/24/2018   Ischemic cardiomyopathy    Myocardial infarction ([place]) 1997   on the OR table    Nephrolithiasis    Obstructive sleep apnea    CPAP   Peripheral neuropathy    Pernicious anemia 08/24/2018   Spinal stenosis    Thrombocytopenia ([facility])    Total knee replacement status 11/29/2014   Type 2 diabetes mellitus with stage 3 chronic kidney disease ([place]) 05/19/2015       PAST SURGICAL HISTORY: Past Surgical History:  Procedure Laterality Date   CARDIAC CATHETERIZATION N/A 10/02/2015   Procedure: Left Heart Cath and Cors/Grafts Angiography;  Surgeon: Belva Crome, MD;  Location: [facility];  Service: Cardiovascular;  Laterality: N/A;   COLONOSCOPY     coronary artery bypass and graft  1997   x 5   ESOPHAGOGASTRODUODENOSCOPY     ICD placed     LAMINECTOMY     LITHOTRIPSY     Right rotator cuff surgery     TONSILLECTOMY     adenoids   TOTAL KNEE ARTHROPLASTY Left    TOTAL KNEE ARTHROPLASTY Right 11/29/2014   Procedure:  RIGHT TOTAL KNEE ARTHROPLASTY;  Surgeon: Leandrew Koyanagi, MD;  Location: [facility];  Service: Orthopedics;  Laterality: Right;     FAMILY HISTORY:  Family History  Problem Relation Age of Onset   Pneumonia Mother    Heart disease Other        No family history     SOCIAL HISTORY:  reports that he has never smoked. He has never used smokeless tobacco. He reports current alcohol use. He reports that he does not use drugs.  The patient lives in [place], and is a retired career [employer].   ALLERGIES: Feraheme [ferumoxytol]; Contrast media [iodinated diagnostic agents]; Iodine; Ioxaglate; Adhesive [tape]; and Seasonal ic [cholestatin]   MEDICATIONS:  Current Facility-Administered Medications  Medication Dose Route Frequency Provider Last Rate Last Dose   0.9 %  sodium chloride infusion   Intravenous Continuous Bonnielee Haff, MD 50 mL/hr at 12/07/18 0842     acetaminophen (TYLENOL) tablet 650 mg  650 mg Oral Q6H PRN Karmen Bongo, MD       Or   acetaminophen (TYLENOL) suppository 650 mg  650 mg Rectal Q6H PRN Karmen Bongo, MD  calcitonin (salmon) (MIACALCIN/FORTICAL) nasal spray 1 spray  1 spray Alternating Nares Daily Nita Sells, MD   1 spray at 12/07/18 1254   carvedilol (COREG) tablet 6.25 mg  6.25 mg Oral BID WC Karmen Bongo, MD   6.25 mg at 12/07/18 0825   ceFAZolin (ANCEF) IVPB 1 g/50 mL premix  1 g Intravenous Q8H Nita Sells, MD 100 mL/hr at 12/07/18 0629 1 g at 12/07/18 0629   docusate sodium (COLACE) capsule 100 mg  100 mg Oral BID Karmen Bongo, MD   100 mg at 12/07/18 1250   heparin ADULT infusion 100 units/mL (25000 units/261m sodium chloride 0.45%)  1,650 Units/hr Intravenous Continuous BEinar Grad RPH 16.5 mL/hr at 12/07/18 1250 1,650 Units/hr at 12/07/18 1250   insulin aspart (novoLOG) injection 0-20 Units  0-20 Units Subcutaneous TID WC KBonnielee Haff MD       insulin aspart (novoLOG) injection 0-5 Units  0-5 Units  Subcutaneous QHS KBonnielee Haff MD       lidocaine (LIDODERM) 5 % 1 patch  1 patch Transdermal Q24H YKarmen Bongo MD   1 patch at 12/06/18 1821   methocarbamol (ROBAXIN) tablet 375 mg  375 mg Oral Q8H PRN YKarmen Bongo MD   375 mg at 12/05/18 2210   ondansetron (ZOFRAN) tablet 4 mg  4 mg Oral Q6H PRN YKarmen Bongo MD       Or   ondansetron ([facility] injection 4 mg  4 mg Intravenous Q6H PRN YKarmen Bongo MD       oxyCODONE-acetaminophen (PERCOCET) 7.5-325 MG per tablet 1 tablet  1 tablet Oral Q4H PRN KBonnielee Haff MD   1 tablet at 12/07/18 1257   polyethylene glycol (MIRALAX / GLYCOLAX) packet 17 g  17 g Oral Daily Curcio, Kristin R, NP       sodium chloride flush (NS) 0.9 % injection 3 mL  3 mL Intravenous QLillia Mountain MD   3 mL at 12/06/18 2234     REVIEW OF SYSTEMS: On review of systems, the patient reports that he is still having a great deal of pain and radiation of his symptoms that begin between the shoulder blades, and radiate around the chest. He denies any numbness or tingling. He denies weakness of his upper extremities, or loss of sensation of his torso or upper extremities. He has noticed only minimal improvement with his narcotics. He describes pain with deep breathing, and that sitting is the most uncomfortable. He denies any chest pain, shortness of breath, fevers, chills, night sweats, unintended weight changes. He reports he has had episodes of constipation at times and has taken colace and miralax at home. He reports a few episodes of urinary urgency. He denies abdominal pain, nausea or vomiting. He denies any other new musculoskeletal or joint aches or pains. A complete review of systems is obtained and is otherwise negative.     PHYSICAL EXAM:  Wt Readings from Last 3 Encounters:  12/07/18 (!) 318 lb 12.8 oz (144.6 kg)  11/23/18 (!) 314 lb 6.4 oz (142.6 kg)  11/14/18 (!) 310 lb (140.6 kg)   Temp Readings from Last 3 Encounters:  12/07/18 97.6  F (36.4 C) (Oral)  11/21/18 98.2 F (36.8 C) (Oral)  11/14/18 98 F (36.7 C) (Oral)   BP Readings from Last 3 Encounters:  12/07/18 (!) 114/55  11/23/18 122/64  11/21/18 (!) 112/51   Pulse Readings from Last 3 Encounters:  12/07/18 86  11/23/18 80  11/21/18 67   Pain Assessment Pain Score: 2 /10  In general this is a well appearing caucasian male in no acute distress. He's alert and oriented x4 and appropriate throughout the examination. Cardiopulmonary assessment is negative for acute distress and he exhibits normal effort. He is visibly uncomfortable with deep breathing. His left chest wall ICD is noted below his left clavicle. He has intact sensation to light tough in the upper and lower extremities bilaterally as well as throughout the trunk. He has intact 5/5 strength as well in upper and lower extremities bilaterally.     ECOG = 4  0 - Asymptomatic (Fully active, able to carry on all predisease activities without restriction)  1 - Symptomatic but completely ambulatory (Restricted in physically strenuous activity but ambulatory and able to carry out work of a light or sedentary nature. For example, light housework, office work)  2 - Symptomatic, <50% in bed during the day (Ambulatory and capable of all self care but unable to carry out any work activities. Up and about more than 50% of waking hours)  3 - Symptomatic, >50% in bed, but not bedbound (Capable of only limited self-care, confined to bed or chair 50% or more of waking hours)  4 - Bedbound (Completely disabled. Cannot carry on any self-care. Totally confined to bed or chair)  5 - Death   Eustace Pen MM, Creech RH, Tormey DC, et al. [phone]). "Toxicity and response criteria of the [facility]". [place] Oncol. 5 (6): 649-55    LABORATORY DATA:  Lab Results  Component Value Date   WBC 5.2 12/07/2018   HGB 9.9 (L) 12/07/2018   HCT 31.6 (L) 12/07/2018   MCV 95.5 12/07/2018   PLT 67 (L)  12/07/2018   Lab Results  Component Value Date   NA 134 (L) 12/07/2018   K 4.8 12/07/2018   CL 106 12/07/2018   CO2 19 (L) 12/07/2018   Lab Results  Component Value Date   ALT 65 (H) 12/06/2018   AST 55 (H) 12/06/2018   ALKPHOS 110 12/06/2018   BILITOT 1.5 (H) 12/06/2018      RADIOGRAPHY: Ct Head Wo Contrast  Result Date: 11/14/2018 CLINICAL DATA:  Syncopal episode today while receiving a blood transfusion. Altered mental status. EXAM: CT HEAD WITHOUT CONTRAST TECHNIQUE: Contiguous axial images were obtained from the base of the skull through the vertex without intravenous contrast. COMPARISON:  None. FINDINGS: Brain: Cortical atrophy noted. Vascular: No hyperdense vessel or unexpected calcification. Skull: Intact.  No focal lesion. Sinuses/Orbits: Negative. Other: None. IMPRESSION: No acute abnormality. Cortical atrophy. Electronically Signed   By: Inge Rise M.D.   On: 11/14/2018 15:26   Ct Thoracic Spine Wo Contrast  Result Date: 12/06/2018 CLINICAL DATA:  T7 compression fracture.  Hepatocellular carcinoma. EXAM: CT THORACIC SPINE WITHOUT CONTRAST TECHNIQUE: Multidetector CT images of the thoracic were obtained using the standard protocol without intravenous contrast. COMPARISON:  CT scan of the thoracic spine performed at [facility] on 11/27/2018 FINDINGS: Vertebrae: There is tumor destroying the T7 and T8 vertebral bodies. There is a pathologic fracture of the T7 vertebral body extending into the posterior margin of T7 with new slight protrusion of the posterior margin of T7 into the spinal canal since the prior exam. There is also paraspinal tumor around T7 and T8. There is no pathologic fracture of T8 at this time. There is no discrete tumor within the spinal canal although the lack of contrast limits definition of the interface between the soft tissues, bone, and thecal sac. There is a new  tiny left pleural effusion and a progressed small right pleural effusion since  the prior study. There is an 11 mm precarinal lymph node and a 11 mm left periaortic lymph node both best seen on image 73 of series 4. These appear larger than on the prior exam. 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 No acute abnormality of the remainder of the thoracic spine. Anterior osteophytes fuse multiple levels. There is degenerative disc disease in the lower cervical spine with a soft disc protrusions central and to the right at C5-6 with some compression of the thecal sac. Aortic atherosclerosis. IMPRESSION: 1. Progression of the destruction and pathologic fracture of T7 since the prior CT scan of 11/27/2018. The partially destroyed posterior margin of T7-8 protrudes slightly into the spinal canal. 2. Unchanged tumor infiltration of the majority of the T8 vertebral body without a pathologic fracture. 3. Progressive paraspinal tumor at T7 and T8. 4. Progressive small right pleural effusion. New small left pleural effusion. 5. Enlarging mediastinal lymph nodes. 6. No definitive tumor within the spinal canal although sensitivity is limited due to the lack of contrast. Electronically Signed   By: Lorriane Shire M.D.   On: 12/06/2018 13:22   US Abdomen Complete  Result Date: 11/21/2018 CLINICAL DATA:  Cirrhosis EXAM: ABDOMEN ULTRASOUND COMPLETE COMPARISON:  CT scan 04/28/2018 FINDINGS: Gallbladder: The gallbladder is contracted. Multiple shadowing gallstones are noted. The largest measures 1.3 cm. No pericholecystic fluid or sonographic Murphy sign to suggest acute cholecystitis. Common bile duct: Diameter: 4.3 mm Liver: Very irregular liver contour with coarse heterogeneous echogenicity consistent with cirrhosis. There are scattered echogenic areas in the liver which could be areas of focal fat but could not exclude neoplastic lesions. The largest measures 6.2 x 3.2 x 3.4 cm. Patent portal vein flow but mixed hepatofugal and hepatopetal flow noted IVC: Poorly  visualized Pancreas: Poorly visualized Spleen: Enlarged. The spleen measures a maximum of 19 cm in the volume is 1680 cubic cm. Right Kidney: Length: 11.2 cm. Normal renal cortical thickness and echogenicity without focal lesions or hydronephrosis. Left Kidney: Length: 12.4 cm. Normal renal cortical thickness and echogenicity without focal lesions or hydronephrosis. Abdominal aorta: Obscured Other findings: No ascites. IMPRESSION: 1. Limited examination due to poor sonographic window. 2. Cirrhotic changes involving the liver. There are several indeterminate echogenic foci as discussed above. Recommend MRI abdomen without and with contrast for further evaluation and to exclude hepatomas. 3. Poor visualization of the pancreas, IVC and aorta. 4. Splenomegaly. Electronically Signed   By: Marijo Sanes M.D.   On: 11/21/2018 15:00   Ct Abdomen W Wo Contrast  Result Date: 12/01/2018 CLINICAL DATA:  Cirrhosis. Indeterminate echogenic focus in right liver on recent ultrasound. EXAM: CT ABDOMEN WITHOUT AND WITH CONTRAST TECHNIQUE: Multidetector CT imaging of the abdomen was performed following the standard protocol before and following the bolus administration of intravenous contrast. CONTRAST:  180m OMNIPAQUE IOHEXOL 300 MG/ML  SOLN COMPARISON:  05/26/2018 CT abdomen. FINDINGS: Lower chest: No significant pulmonary nodules or acute consolidative airspace disease. Punctate right lung base calcified granuloma. ICD leads are seen terminating in the right atrium and right ventricle. Coronary atherosclerosis. Newly mildly enlarged 1.1 cm right pericardiophrenic node (series 9/image 21). Hepatobiliary: Diffusely irregular liver surface, compatible with hepatic cirrhosis. There is an 8.0 x 6.0 cm right liver dome mass (series 5/image 14) with mild arterial phase hyperenhancement, no clear washout or enhancing capsule, not well visualized on the 05/26/2018 CT, favored to  be increased from 5.3 x 4.5 cm, compatible with LI-RADS  category 5. surrounding heterogeneous enhancement and scattered subcentimeter nodules in the superior right liver lobe, not appreciably changed. Cholelithiasis. No gallbladder wall thickening. No biliary ductal dilatation. Pancreas: Normal, with no mass or duct dilation. Spleen: Moderate splenomegaly (craniocaudal splenic length 17.0 cm). No splenic mass. Adrenals/Urinary Tract: Left adrenal 3.5 cm nodule with precontrast density 41 HU, new since 05/26/2018 CT. No right adrenal nodule. Nonobstructing 5 mm lower left renal stones. Borderline mild bilateral hydronephrosis, new. Stomach/Bowel: Normal non-distended stomach. Visualized small and large bowel is normal caliber, with no bowel wall thickening. Vascular/Lymphatic: Atherosclerotic nonaneurysmal abdominal aorta. Patent portal, splenic, hepatic and renal veins. No pathologically enlarged lymph nodes in the abdomen. Other: No pneumoperitoneum, ascites or focal fluid collection. Musculoskeletal: No aggressive appearing focal osseous lesions. Marked thoracolumbar spondylosis. IMPRESSION: 1. Cirrhosis. Right liver dome 8.0 cm mass, LI-RADS category 5 (definitely hepatocellular carcinoma). 2. New 3.5 cm left adrenal nodule, compatible with left adrenal metastasis. 3. New right pericardiophrenic adenopathy suggesting nodal metastasis. 4. Moderate splenomegaly. 5. Borderline mild bilateral hydronephrosis is new, recommend correlation with serum creatinine. Nonobstructing left nephrolithiasis. 6. Cholelithiasis. 7.  Aortic Atherosclerosis (ICD10-I70.0). These results will be called to the ordering clinician or representative by the Radiologist Assistant, and communication documented in the PACS or zVision Dashboard. Electronically Signed   By: Ilona Sorrel M.D.   On: 12/01/2018 17:55   Dg Chest Port 1 View  Result Date: 12/03/2018 CLINICAL DATA:  Near syncope. EXAM: PORTABLE CHEST 1 VIEW COMPARISON:  September 07, 2018 FINDINGS: Stable AICD device. Cardiomegaly,  unchanged. The hila, mediastinum, lungs, and pleura are unremarkable. A transcutaneous pacer lead overlies the upper medial left chest. IMPRESSION: No acute interval change. Electronically Signed   By: Dorise Bullion III M.D   On: 12/03/2018 14:51   Dg Esophagus W Single Cm (sol Or Thin Ba)  Result Date: 12/05/2018 CLINICAL DATA:  78 year old male with history of difficulty swallowing. Unable to belt. EXAM: ESOPHOGRAM/BARIUM SWALLOW TECHNIQUE: Single contrast examination was performed using  thin barium. FLUOROSCOPY TIME:  Fluoroscopy Time:  42 seconds Radiation Exposure Index (if provided by the fluoroscopic device): 9.9 mGy COMPARISON:  None. FINDINGS: Single contrast imaging of the esophagus was performed due to limited patient mobility. Within the limitations of the examination, the esophagus appears structurally normal, without definite esophageal mass, stricture or esophageal ring. No hiatal hernia. During real-time observation multiple single swallow attempts fail to fully propagate, and extensive tertiary contractions were observed throughout the examination. IMPRESSION: 1. Nonspecific esophageal motility disorder with extensive tertiary contractions. Electronically Signed   By: Vinnie Langton M.D.   On: 12/05/2018 14:14       IMPRESSION/PLAN: 1. Metastatic hepatocellular carcinoma with bone metastases to the thoracic spine.  Dr. Sondra Come has reviewed the patient's imaging, and has spoken with Dr. Benay Spice who is on-call for [name]. The patient has radiographic evidence of hepatocellular carcinoma. The patient would be a candidate to consider palliative radiotherapy to the thoracic spine.  While the patient cannot have MRI scan to confirm his primary disease, the data is quite suspicious that he has a hepatocellular cancer.  We discussed the rationale to consider palliative radiotherapy for pain relief, however the patient does understand that there could be persistence of pain due to the  compressive fracture that is noted at the T7 vertebral body.  If this persists, perhaps interventional radiology may reconsider if the tumor is responsive to therapy and not in the spinal canal.  We discussed the risks, benefits, short and long-term effects of radiotherapy, the patient is interested in proceeding.  We will plan for simulation to occur tomorrow at 11:00 at [facility].  He will be CareLinked over for this.  Written consent will be obtained at that time and he will meet Dr. Sondra Come tomorrow. 2. Pain secondary to #1. We discussed changing his percocet to oxycodone, and giving him the option to consider morphine prn as needed when pain becomes more intense. We also discussed off label consdieration for gabapentin for perichondritis. He is interested in trying this. We will follow this expectantly.   In a visit lasting 90 minutes, greater than 50% of the time was spent face to face discussing his case, and coordinating the patient's care.      Carola Rhine, PAC

## 2018-12-07 NOTE — Progress Notes (Signed)
ANTICOAGULATION CONSULT NOTE - Follow Up Consult  Pharmacy Consult for Heparin Indication: atrial fibrillation  Allergies  Allergen Reactions  . Feraheme [Ferumoxytol] Other (See Comments)    Patient had  reaction during infusion in Feb 2020 decision made to change to [facility] (passed out)  . Contrast Media [Iodinated Diagnostic Agents] Rash and Other (See Comments)    Extreme flushing  . Iodine Other (See Comments)    flushing  . Ioxaglate Other (See Comments)    Extreme flushing  . Adhesive [Tape] Rash    Adhesive tapes causes redness/itching/rash after removal  . Seasonal Ic [Cholestatin] Other (See Comments)    Unknown reaction    Patient Measurements: Height: 6' 1"  (185.4 cm) Weight: (!) 318 lb 12.8 oz (144.6 kg) IBW/kg (Calculated) : 79.9 Heparin Dosing Weight: 114 kg  Vital Signs: Temp: 97.7 F (36.5 C) (03/19 0204) Temp Source: Oral (03/19 0204) BP: 129/70 (03/19 0613) Pulse Rate: 86 (03/19 0613)  Labs: Recent Labs    12/05/18 0335 12/06/18 0310  12/06/18 1255 12/06/18 1901 12/07/18 0404 12/07/18 1058  HGB 9.8* 10.1*  --   --   --  9.9*  --   HCT 30.7* 30.7*  --   --   --  31.6*  --   PLT 66* 67*  --   --   --  67*  --   APTT 120*  --   --   --   --   --   --   HEPARINUNFRC 0.40 0.32  --   --   --   --  0.26*  CREATININE 1.74* 1.45*   < > 1.37* 1.30* 1.20  --    < > = values in this interval not displayed.    Estimated Creatinine Clearance: 77.1 mL/min (by C-G formula based on SCr of 1.2 mg/dL).  Assessment: 50 yoF on Xarelto at home for hx of PAF started on IV heparin with need for procedures. Heparin stopped overnight briefly for bleeding from groin which resolved with pressure. Heparin level now subtherapeutic at 0.26, bleeding resolved per RN, CBC stable.   Goal of Therapy:  Heparin level 0.3-0.7 units/ml Monitor platelets by anticoagulation protocol: Yes   Plan:  -Increase heparin to 1650 units/hr -Recheck heparin level and CBC in  am  Arrie Senate, PharmD, BCPS Clinical Pharmacist [phone] Please check AMION for all [facility] numbers 12/07/2018

## 2018-12-07 NOTE — Progress Notes (Signed)
[facility] PROGRESS NOTE  JAIKOB BORGWARDT HBZ:[id number] DOB: 1941/08/16 DOA: 12/03/2018  PCP: Seward Carol, MD  Brief History/Interval Summary: 78 year old male who has a past medical history of coronary artery disease status post CABG in 1997, history of pacemaker placement, history of atrial fibrillation, chronic kidney disease stage III, sleep apnea, anemia, diabetes presented with symptomatic bradycardia.  Thought to have complete heart block.  He was seen by cardiology who adjusted his pacemaker settings with improvement.  Patient was also found to have right lower extremity cellulitis.  He was also recently diagnosed with hepatocellular carcinoma.  Since he had back pain he was also diagnosed as having T7 compression fracture.  Reason for Visit: Right lower extremity cellulitis.  T7 compression fracture.  Consultants: Cardiology.  Gastroenterology.  Medical oncology.  Interventional radiology.  Procedures: Pacemaker interrogation and adjustment of settings.  Antibiotics: Initially placed on vancomycin and ceftriaxone.  Currently on cefazolin.  Subjective: Patient continues to have severe pain in his back.  He states that he is feeling absolutely miserable as a result.  10 out of 10 in intensity especially when he tries to sit up.    ROS: Denies any chest pain or shortness of breath.  Objective:  Vital Signs  Vitals:   12/06/18 1441 12/06/18 2056 12/07/18 0204 12/07/18 0613  BP: (!) 121/55 (!) 118/54 128/65 129/70  Pulse: 82 76 75 86  Resp: 18 20 (!) 25   Temp:  98.4 F (36.9 C) 97.7 F (36.5 C)   TempSrc:  Oral Oral   SpO2: 98% 97% 93% 97%  Weight:    (!) 144.6 kg  Height:        Intake/Output Summary (Last 24 hours) at 12/07/2018 0817 Last data filed at 12/07/2018 1017 Gross per 24 hour  Intake 4428.36 ml  Output 1100 ml  Net 3328.36 ml   Filed Weights   12/05/18 0546 12/06/18 0419 12/07/18 0613  Weight: (!) 141 kg (!) 145.3 kg (!) 144.6 kg    General appearance: Awake alert.  In no distress.  Morbidly obese Resp: Clear to auscultation bilaterally.  Normal effort Cardio: S1-S2 is normal regular.  No S3-S4.  No rubs murmurs or bruit GI: Abdomen is soft.  Nontender nondistended.  Bowel sounds are present normal.  No masses organomegaly Extremities: Improved erythema in the right lower extremity. Neurologic: He is alert and oriented x3.  He is able to move his upper and lower extremities.  No obvious deficits noted.   Lab Results:  Data Reviewed: I have personally reviewed following labs and imaging studies  CBC: Recent Labs  Lab 12/03/18 1417 12/04/18 0416 12/05/18 0335 12/06/18 0310 12/07/18 0404  WBC 8.3 6.8 4.7 6.0 5.2  NEUTROABS 6.3  --  3.2  --   --   HGB 11.7* 10.9* 9.8* 10.1* 9.9*  HCT 37.5* 33.3* 30.7* 30.7* 31.6*  MCV 94.2 92.5 92.5 93.0 95.5  PLT 115* 88* 66* 67* 67*    Basic Metabolic Panel: Recent Labs  Lab 12/06/18 0310 12/06/18 1141 12/06/18 1255 12/06/18 1901 12/07/18 0404  NA 135 131* 134* 135 134*  K 5.2* 6.3* 5.6* 5.7* 4.8  CL 107 106 108 108 106  CO2 19* 14* 16* 20* 19*  GLUCOSE 224* 259* 246* 222* 226*  BUN 36* 36* 36* 35* 33*  CREATININE 1.45* 1.33* 1.37* 1.30* 1.20  CALCIUM 9.0 8.7* 8.9 8.8* 9.0    GFR: Estimated Creatinine Clearance: 77.1 mL/min (by C-G formula based on SCr of 1.2 mg/dL).  Liver Function Tests: Recent Labs  Lab 12/03/18 1417 12/05/18 0335 12/06/18 0310  AST 85* 87* 55*  ALT 94* 96* 65*  ALKPHOS 151* 125 110  BILITOT 1.0 1.2 1.5*  PROT 6.5 6.1* 6.5  ALBUMIN 3.1* 3.4* 4.0     Cardiac Enzymes: Recent Labs  Lab 12/03/18 2230 12/04/18 0416 12/04/18 1103  TROPONINI 0.06* 0.06* 0.05*     CBG: Recent Labs  Lab 12/06/18 1146 12/06/18 1437 12/06/18 1614 12/06/18 2232 12/07/18 0812  GLUCAP 270* 207* 219* 263* 189*      Radiology Studies: Ct Thoracic Spine Wo Contrast  Result Date: 12/06/2018 CLINICAL DATA:  T7 compression fracture.   Hepatocellular carcinoma. EXAM: CT THORACIC SPINE WITHOUT CONTRAST TECHNIQUE: Multidetector CT images of the thoracic were obtained using the standard protocol without intravenous contrast. COMPARISON:  CT scan of the thoracic spine performed at [facility] on 11/27/2018 FINDINGS: Vertebrae: There is tumor destroying the T7 and T8 vertebral bodies. There is a pathologic fracture of the T7 vertebral body extending into the posterior margin of T7 with new slight protrusion of the posterior margin of T7 into the spinal canal since the prior exam. There is also paraspinal tumor around T7 and T8. There is no pathologic fracture of T8 at this time. There is no discrete tumor within the spinal canal although the lack of contrast limits definition of the interface between the soft tissues, bone, and thecal sac. There is a new tiny left pleural effusion and a progressed small right pleural effusion since the prior study. There is an 11 mm precarinal lymph node and a 11 mm left periaortic lymph node both best seen on image 73 of series 4. These appear larger than on the prior exam. 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 No acute abnormality of the remainder of the thoracic spine. Anterior osteophytes fuse multiple levels. There is degenerative disc disease in the lower cervical spine with a soft disc protrusions central and to the right at C5-6 with some compression of the thecal sac. Aortic atherosclerosis. IMPRESSION: 1. Progression of the destruction and pathologic fracture of T7 since the prior CT scan of 11/27/2018. The partially destroyed posterior margin of T7-8 protrudes slightly into the spinal canal. 2. Unchanged tumor infiltration of the majority of the T8 vertebral body without a pathologic fracture. 3. Progressive paraspinal tumor at T7 and T8. 4. Progressive small right pleural effusion. New small left pleural effusion. 5. Enlarging mediastinal lymph nodes. 6.  No definitive tumor within the spinal canal although sensitivity is limited due to the lack of contrast. Electronically Signed   By: Lorriane Shire M.D.   On: 12/06/2018 13:22   Dg Esophagus W Single Cm (sol Or Thin Ba)  Result Date: 12/05/2018 CLINICAL DATA:  78 year old male with history of difficulty swallowing. Unable to belt. EXAM: ESOPHOGRAM/BARIUM SWALLOW TECHNIQUE: Single contrast examination was performed using  thin barium. FLUOROSCOPY TIME:  Fluoroscopy Time:  42 seconds Radiation Exposure Index (if provided by the fluoroscopic device): 9.9 mGy COMPARISON:  None. FINDINGS: Single contrast imaging of the esophagus was performed due to limited patient mobility. Within the limitations of the examination, the esophagus appears structurally normal, without definite esophageal mass, stricture or esophageal ring. No hiatal hernia. During real-time observation multiple single swallow attempts fail to fully propagate, and extensive tertiary contractions were observed throughout the examination. IMPRESSION: 1. Nonspecific esophageal motility disorder with extensive tertiary contractions. Electronically Signed   By: Mauri Brooklyn.D.  On: 12/05/2018 14:14     Medications:  Scheduled: . calcitonin (salmon)  1 spray Alternating Nares Daily  . carvedilol  6.25 mg Oral BID WC  . docusate sodium  100 mg Oral BID  . insulin regular human CONCENTRATED  0-130 Units Subcutaneous TID WC  . lidocaine  1 patch Transdermal Q24H  . sodium chloride flush  3 mL Intravenous Q12H   Continuous: . sodium chloride 100 mL/hr at 12/07/18 0011  .  ceFAZolin (ANCEF) IV 1 g (12/07/18 0629)  . heparin 1,600 Units/hr (12/07/18 0244)   RCV:[id number] **OR** acetaminophen, methocarbamol, morphine injection, ondansetron **OR** ondansetron (ZOFRAN) IV, oxyCODONE-acetaminophen, polyethylene glycol    Assessment/Plan:  Recurrent right lower extremity cellulitis Initially placed on vancomycin and ceftriaxone.   Transitioned to cefazolin after discussions with infectious disease.  Cellulitis has improved.  Continue IV antibiotics for now until we sort out his back issue.    T7 fracture with severe acute back pain CT scan of the thoracic spine was done yesterday which shows evidence for tumor involvement in T7-T8 and paraspinal areas.  Progression of the pathological fracture was noted.  Discussed with interventional radiology.  They have reviewed imaging studies and feel that he is very high risk to undergo any kind of vertebroplasty by interventional radiology.  They recommend discussing with neurosurgery as they are concerned about unstable spine.  Discussed with Dr. Saintclair Halsted this morning who will see the patient and look at his imaging studies.  Patient cannot undergo MRI due to presence of pacemaker.  Discussed also with Dr. Benay Spice with medical oncology who will discuss with his colleagues with radiation oncology to see if there is any role for radiation treatments.  Will increase the dose of his narcotics to provide better pain control.  Bowel regimen.  Dysphagia Seen by gastroenterology.  Barium swallow does show evidence for dysmotility.  Per gastroenterology no further evaluation necessary in the hospital.  Outpatient follow-up.  Hepatocellular carcinoma Management per medical oncology.  Complete heart block on admission Patient had a pacemaker which was interrogated and settings were adjusted by cardiology.  Seems to be stable now.  ARF with hyperkalemia Baseline creatinine about 1.3.  Creatinine was elevated during this hospitalization.  Noted to be 2.78 at admission.  He was given IV fluids with improvement.  He has had hyperkalemia as well.  He was given [facility] x1 yesterday.  Improvement in potassium level noted.  Monitor urine output.  Normocytic anemia and thrombocytopenia Platelet counts are stable.  Hemoglobin is stable.  No evidence of overt bleeding.  History of cardiomyopathy He had a  low EF previously which has improved.  Continue cardiac medications.  Hyperlipidemia Holding TriCor and statin.  Diabetes mellitus type 2 with neuropathy CBGs not very well controlled.  Apparently has been refusing his U-500 insulin.  Will discontinue for now.  Atrial fibrillation Chads vascular score greater than 4.  Patient was on Xarelto prior to admission.  Currently on IV heparin in case there is a need for procedure.  There was some mention of bleeding from his groin area.  His groin area was examined.  He has multiple tiny black spots on his scrotum which have bleed periodically.  This is been a chronic issue.  No active bleeding noted currently.  History of obstructive sleep apnea CPAP  Morbid obesity BMI is 42.26.   DVT Prophylaxis: Clear to continue IV heparin as long as there is no profuse bleeding. Code Status: DNR Family Communication: Discussed with the patient and his  wife Disposition Plan: Management as outlined above.  Unfortunately does not appear that vertebroplasty is a good option.  Patient may not be a candidate for surgical intervention due to his numerous comorbidities.  Wait for further input from medical oncology.  May need to involve palliative medicine.    LOS: 4 days   Caleesi Kohl [employer] on [email or web address]  12/07/2018, 8:17 AM

## 2018-12-07 NOTE — Consult Note (Signed)
Reason for Consult:back pain T7-T8 pathologic fractures Referring Physician: .[facility]  Robert Gregory is an 78 y.o. male.  HPI: 78 year old gentleman recently diagnosed with a hepato-cellular carcinoma who also has multiple other medical problems to include coronary artery disease pacemaker placed congestive heart failure from [facility] hypertension chronic back issues who had a continuous syncopal episode due to dysfunction of his pacemaker and also was having severe interscapular back pain. Workup revealed compression fracture and pathologic compression fractures of T7 with involvement of the T8 vertebral body. Patient also has what appears to be diffuse idiopathic skeletal hyperostosis or ankylosing spondylitis with a bamboo-appearing spine and CT which may help provide some stability here. Patient was told that he was not a good surgicaldidate and was referred for vertebroplasty which was declined by radiology. Patient denies any lower 70 symptoms any numbness tingling weakness in his legs. He was given a brace that he finds extremely uncomfortable and is untenable.  Past Medical History:  Diagnosis Date  . Arthritis   . Atrial fibrillation ([place])    takes Xarelto daily  . Cellulitis    RLE  . Cholelithiasis   . Chronic back pain    scoliosis/arthritis  . Complication of anesthesia    forgetful for about 2-3wks after anesthesia  . Hepatocellular carcinoma ([place])   . History of colon polyps   . Hyperlipidemia    takes [employer] daily  . Hypertension    takes Benicar and Coreg daily  . ICD (implantable cardioverter-defibrillator) in place 11/07/2013  . Iron deficiency anemia due to chronic blood loss 08/24/2018  . Iron malabsorption 08/24/2018  . Ischemic cardiomyopathy   . Myocardial infarction ([place]) 1997   on the OR table   . Nephrolithiasis   . Obstructive sleep apnea    CPAP  . Peripheral neuropathy   . Pernicious anemia 08/24/2018  . Spinal stenosis    . Thrombocytopenia ([place])   . Total knee replacement status 11/29/2014  . Type 2 diabetes mellitus with stage 3 chronic kidney disease ([place]) 05/19/2015    Past Surgical History:  Procedure Laterality Date  . CARDIAC CATHETERIZATION N/A 10/02/2015   Procedure: Left Heart Cath and Cors/Grafts Angiography;  Surgeon: Belva Crome, MD;  Location: [facility];  Service: Cardiovascular;  Laterality: N/A;  . COLONOSCOPY    . coronary artery bypass and graft  1997   x 5  . ESOPHAGOGASTRODUODENOSCOPY    . ICD placed    . LAMINECTOMY    . LITHOTRIPSY    . Right rotator cuff surgery    . TONSILLECTOMY     adenoids  . TOTAL KNEE ARTHROPLASTY Left   . TOTAL KNEE ARTHROPLASTY Right 11/29/2014   Procedure: RIGHT TOTAL KNEE ARTHROPLASTY;  Surgeon: Leandrew Koyanagi, MD;  Location: [facility];  Service: Orthopedics;  Laterality: Right;    Family History  Problem Relation Age of Onset  . Pneumonia Mother   . Heart disease Other        No family history    Social History:  reports that he has never smoked. He has never used smokeless tobacco. He reports current alcohol use. He reports that he does not use drugs.  Allergies:  Allergies  Allergen Reactions  . Feraheme [Ferumoxytol] Other (See Comments)    Patient had  reaction during infusion in Feb 2020 decision made to change to [facility] (passed out)  . Contrast Media [Iodinated Diagnostic Agents] Rash and Other (See Comments)    Extreme flushing  .  Iodine Other (See Comments)    flushing  . Ioxaglate Other (See Comments)    Extreme flushing  . Adhesive [Tape] Rash    Adhesive tapes causes redness/itching/rash after removal  . Seasonal Ic [Cholestatin] Other (See Comments)    Unknown reaction    Medications: I have reviewed the patient's current medications.  Results for orders placed or performed during the hospital encounter of 12/03/18 (from the past 48 hour(s))  Glucose, capillary     Status: Abnormal   Collection Time: 12/05/18   4:23 PM  Result Value Ref Range   Glucose-Capillary 202 (H) 70 - 99 mg/dL  Glucose, capillary     Status: Abnormal   Collection Time: 12/05/18  5:35 PM  Result Value Ref Range   Glucose-Capillary 185 (H) 70 - 99 mg/dL  Glucose, capillary     Status: Abnormal   Collection Time: 12/05/18 10:07 PM  Result Value Ref Range   Glucose-Capillary 222 (H) 70 - 99 mg/dL  Heparin level (unfractionated)     Status: None   Collection Time: 12/06/18  3:10 AM  Result Value Ref Range   Heparin Unfractionated 0.32 0.30 - 0.70 IU/mL    Comment: (NOTE) If heparin results are below expected values, and patient dosage has  been confirmed, suggest follow up testing of antithrombin III levels. Performed at [facility], [place] [address] [place], [place] [address]   CBC     Status: Abnormal   Collection Time: 12/06/18  3:10 AM  Result Value Ref Range   WBC 6.0 4.0 - 10.5 K/uL   RBC 3.30 (L) 4.22 - 5.81 MIL/uL   Hemoglobin 10.1 (L) 13.0 - 17.0 g/dL   HCT 30.7 (L) 39.0 - 52.0 %   MCV 93.0 80.0 - 100.0 fL   MCH 30.6 26.0 - 34.0 pg   MCHC 32.9 30.0 - 36.0 g/dL   RDW 18.6 (H) 11.5 - 15.5 %   Platelets 67 (L) 150 - 400 K/uL    Comment: Immature Platelet Fraction may be clinically indicated, consider ordering this additional test [id number] CONSISTENT WITH PREVIOUS RESULT    nRBC 0.0 0.0 - 0.2 %    Comment: Performed at [facility], [place] [address], [place], [place] [address]  Comprehensive metabolic panel     Status: Abnormal   Collection Time: 12/06/18  3:10 AM  Result Value Ref Range   Sodium 135 135 - 145 mmol/L   Potassium 5.2 (H) 3.5 - 5.1 mmol/L   Chloride 107 98 - 111 mmol/L   CO2 19 (L) 22 - 32 mmol/L   Glucose, Bld 224 (H) 70 - 99 mg/dL   BUN 36 (H) 8 - 23 mg/dL   Creatinine, Ser 1.45 (H) 0.61 - 1.24 mg/dL   Calcium 9.0 8.9 - 10.3 mg/dL   Total Protein 6.5 6.5 - 8.1 g/dL   Albumin 4.0 3.5 - 5.0 g/dL   AST 55 (H) 15 - 41 U/L   ALT 65 (H) 0 - 44 U/L   Alkaline Phosphatase 110  38 - 126 U/L   Total Bilirubin 1.5 (H) 0.3 - 1.2 mg/dL   GFR calc non Af Amer 46 (L) >60 mL/min   GFR calc Af Amer 53 (L) >60 mL/min   Anion gap 9 5 - 15    Comment: Performed at [facility], [place] [address] [place], [place] [address]  Glucose, capillary     Status: Abnormal   Collection Time: 12/06/18  7:47 AM  Result  Value Ref Range   Glucose-Capillary 185 (H) 70 - 99 mg/dL  Basic metabolic panel     Status: Abnormal   Collection Time: 12/06/18 11:41 AM  Result Value Ref Range   Sodium 131 (L) 135 - 145 mmol/L   Potassium 6.3 (HH) 3.5 - 5.1 mmol/L    Comment: CRITICAL RESULT CALLED TO, READ BACK BY AND VERIFIED WITH: O.PEGRAM,RN 12/06/2018 1236 DAVISB SLIGHT HEMOLYSIS    Chloride 106 98 - 111 mmol/L   CO2 14 (L) 22 - 32 mmol/L   Glucose, Bld 259 (H) 70 - 99 mg/dL   BUN 36 (H) 8 - 23 mg/dL   Creatinine, Ser 1.33 (H) 0.61 - 1.24 mg/dL   Calcium 8.7 (L) 8.9 - 10.3 mg/dL   GFR calc non Af Amer 51 (L) >60 mL/min   GFR calc Af Amer 59 (L) >60 mL/min   Anion gap 11 5 - 15    Comment: Performed at [facility] [address] [place], [place] [address]  Glucose, capillary     Status: Abnormal   Collection Time: 12/06/18 11:46 AM  Result Value Ref Range   Glucose-Capillary 270 (H) 70 - 99 mg/dL  Basic metabolic panel     Status: Abnormal   Collection Time: 12/06/18 12:55 PM  Result Value Ref Range   Sodium 134 (L) 135 - 145 mmol/L   Potassium 5.6 (H) 3.5 - 5.1 mmol/L   Chloride 108 98 - 111 mmol/L   CO2 16 (L) 22 - 32 mmol/L   Glucose, Bld 246 (H) 70 - 99 mg/dL   BUN 36 (H) 8 - 23 mg/dL   Creatinine, Ser 1.37 (H) 0.61 - 1.24 mg/dL   Calcium 8.9 8.9 - 10.3 mg/dL   GFR calc non Af Amer 49 (L) >60 mL/min   GFR calc Af Amer 57 (L) >60 mL/min   Anion gap 10 5 - 15    Comment: Performed at [facility] [address] [place], [place] [address]  Glucose, capillary     Status: Abnormal   Collection Time: 12/06/18  2:37 PM  Result Value Ref Range    Glucose-Capillary 207 (H) 70 - 99 mg/dL  Glucose, capillary     Status: Abnormal   Collection Time: 12/06/18  4:14 PM  Result Value Ref Range   Glucose-Capillary 219 (H) 70 - 99 mg/dL  Basic metabolic panel     Status: Abnormal   Collection Time: 12/06/18  7:01 PM  Result Value Ref Range   Sodium 135 135 - 145 mmol/L   Potassium 5.7 (H) 3.5 - 5.1 mmol/L   Chloride 108 98 - 111 mmol/L   CO2 20 (L) 22 - 32 mmol/L   Glucose, Bld 222 (H) 70 - 99 mg/dL   BUN 35 (H) 8 - 23 mg/dL   Creatinine, Ser 1.30 (H) 0.61 - 1.24 mg/dL   Calcium 8.8 (L) 8.9 - 10.3 mg/dL   GFR calc non Af Amer 53 (L) >60 mL/min   GFR calc Af Amer >60 >60 mL/min   Anion gap 7 5 - 15    Comment: Performed at [facility] [address] [place], [place] [address]  Glucose, capillary     Status: Abnormal   Collection Time: 12/06/18 10:32 PM  Result Value Ref Range   Glucose-Capillary 263 (H) 70 - 99 mg/dL  CBC     Status: Abnormal   Collection Time: 12/07/18  4:04 AM  Result Value Ref Range  WBC 5.2 4.0 - 10.5 K/uL   RBC 3.31 (L) 4.22 - 5.81 MIL/uL   Hemoglobin 9.9 (L) 13.0 - 17.0 g/dL   HCT 31.6 (L) 39.0 - 52.0 %   MCV 95.5 80.0 - 100.0 fL   MCH 29.9 26.0 - 34.0 pg   MCHC 31.3 30.0 - 36.0 g/dL   RDW 18.6 (H) 11.5 - 15.5 %   Platelets 67 (L) 150 - 400 K/uL    Comment: REPEATED TO VERIFY Immature Platelet Fraction may be clinically indicated, consider ordering this additional test [id number] CONSISTENT WITH PREVIOUS RESULT    nRBC 0.0 0.0 - 0.2 %    Comment: Performed at [facility], [place] [address] [place], [place] [address]  Basic metabolic panel     Status: Abnormal   Collection Time: 12/07/18  4:04 AM  Result Value Ref Range   Sodium 134 (L) 135 - 145 mmol/L   Potassium 4.8 3.5 - 5.1 mmol/L   Chloride 106 98 - 111 mmol/L   CO2 19 (L) 22 - 32 mmol/L   Glucose, Bld 226 (H) 70 - 99 mg/dL   BUN 33 (H) 8 - 23 mg/dL   Creatinine, Ser 1.20 0.61 - 1.24 mg/dL   Calcium 9.0 8.9 - 10.3 mg/dL    GFR calc non Af Amer 58 (L) >60 mL/min   GFR calc Af Amer >60 >60 mL/min   Anion gap 9 5 - 15    Comment: Performed at [facility] [address], [place], [place] [address]  Glucose, capillary     Status: Abnormal   Collection Time: 12/07/18  8:12 AM  Result Value Ref Range   Glucose-Capillary 189 (H) 70 - 99 mg/dL  Heparin level (unfractionated)     Status: Abnormal   Collection Time: 12/07/18 10:58 AM  Result Value Ref Range   Heparin Unfractionated 0.26 (L) 0.30 - 0.70 IU/mL    Comment: (NOTE) If heparin results are below expected values, and patient dosage has  been confirmed, suggest follow up testing of antithrombin III levels. Performed at [facility], [place] [address] [place], [place] [address]     Ct Thoracic Spine Wo Contrast  Result Date: 12/06/2018 CLINICAL DATA:  T7 compression fracture.  Hepatocellular carcinoma. EXAM: CT THORACIC SPINE WITHOUT CONTRAST TECHNIQUE: Multidetector CT images of the thoracic were obtained using the standard protocol without intravenous contrast. COMPARISON:  CT scan of the thoracic spine performed at [facility] on 11/27/2018 FINDINGS: Vertebrae: There is tumor destroying the T7 and T8 vertebral bodies. There is a pathologic fracture of the T7 vertebral body extending into the posterior margin of T7 with new slight protrusion of the posterior margin of T7 into the spinal canal since the prior exam. There is also paraspinal tumor around T7 and T8. There is no pathologic fracture of T8 at this time. There is no discrete tumor within the spinal canal although the lack of contrast limits definition of the interface between the soft tissues, bone, and thecal sac. There is a new tiny left pleural effusion and a progressed small right pleural effusion since the prior study. There is an 11 mm precarinal lymph node and a 11 mm left periaortic lymph node both best seen on image 73 of series 4. These appear larger than on the prior exam.  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 No acute abnormality of the remainder of the thoracic spine. Anterior osteophytes fuse multiple  levels. There is degenerative disc disease in the lower cervical spine with a soft disc protrusions central and to the right at C5-6 with some compression of the thecal sac. Aortic atherosclerosis. IMPRESSION: 1. Progression of the destruction and pathologic fracture of T7 since the prior CT scan of 11/27/2018. The partially destroyed posterior margin of T7-8 protrudes slightly into the spinal canal. 2. Unchanged tumor infiltration of the majority of the T8 vertebral body without a pathologic fracture. 3. Progressive paraspinal tumor at T7 and T8. 4. Progressive small right pleural effusion. New small left pleural effusion. 5. Enlarging mediastinal lymph nodes. 6. No definitive tumor within the spinal canal although sensitivity is limited due to the lack of contrast. Electronically Signed   By: Lorriane Shire M.D.   On: 12/06/2018 13:22   Dg Esophagus W Single Cm (sol Or Thin Ba)  Result Date: 12/05/2018 CLINICAL DATA:  78 year old male with history of difficulty swallowing. Unable to belt. EXAM: ESOPHOGRAM/BARIUM SWALLOW TECHNIQUE: Single contrast examination was performed using  thin barium. FLUOROSCOPY TIME:  Fluoroscopy Time:  42 seconds Radiation Exposure Index (if provided by the fluoroscopic device): 9.9 mGy COMPARISON:  None. FINDINGS: Single contrast imaging of the esophagus was performed due to limited patient mobility. Within the limitations of the examination, the esophagus appears structurally normal, without definite esophageal mass, stricture or esophageal ring. No hiatal hernia. During real-time observation multiple single swallow attempts fail to fully propagate, and extensive tertiary contractions were observed throughout the examination. IMPRESSION: 1. Nonspecific esophageal motility disorder with extensive tertiary  contractions. Electronically Signed   By: Vinnie Langton M.D.   On: 12/05/2018 14:14    Review of Systems  Musculoskeletal: Positive for back pain.   Blood pressure 129/70, pulse 86, temperature 97.7 F (36.5 C), temperature source Oral, resp. rate (!) 25, height 6' 1"  (1.854 m), weight (!) 144.6 kg, SpO2 97 %. Physical Exam  Constitutional: He is oriented to person, place, and time.  Neurological: He is alert and oriented to person, place, and time. GCS eye subscore is 4. GCS verbal subscore is 5. GCS motor subscore is 6.  Strength is 5 out of 5 his iliopsoas, quads, hamstrings, gastric, into tibialis, and EHL.    Assessment/Plan: 78 year old gentleman with pathologic involvement at T7-T8 vertebral bodies slight compression deformity of T7 minimal canal compromise there is posterior element involvement. His underlying skeletal condition with hyperostosis may provide some additional stability and it does appear to be callus complication across the disc space. I agree the patient is a very poor surgical candidate and is a high risk patient for surgery for failure and making him worse and is. I do think that we can potentially find him a better brace that is more functional for him then upon obtaining the brace I would check an upright x-ray to make sure is not collapsing kyphotic kyphosis and an pain becomes under better control and I will consider consult radiation oncology as his best treatment approach for this. Also I think would be worthwhile having oncology and Dr. Webb Silversmith of her provide some prognostic support with regard to control of his hepato- cellular carcinoma.  Jhoana Upham P 12/07/2018, 12:11 PM

## 2018-12-07 NOTE — Progress Notes (Signed)
NIR.  Dr. Estanislado Pandy received call from Dr. Benay Spice regarding possible image-guided T7 kyphoplasty/vertebroplasty. Please see prior note for plan.  Bea Graff Louk, PA-C 12/07/2018, 11:25 AM

## 2018-12-07 NOTE — Progress Notes (Signed)
OT Cancellation Note  Patient Details Name: Robert Gregory MRN: [id number] DOB: 11-18-1940   Cancelled Treatment:    Reason Eval/Treat Not Completed: Patient declined, no reason specified(Per PT, pt and wife declining therapy due to pain. Neurosurgery planning on getting new brace. Will follow as schedule allows. Thank you.)  [place], OTR/L Acute Rehab Pager: [phone] Office: [phone] 12/07/2018, 1:06 PM

## 2018-12-07 NOTE — Progress Notes (Signed)
Patient had bleeding episode on his groin area, bottom sheet had large area from the bleed.  Per wife, stated he has these episodes from the small black bumps, she called it "petechia" but never this big.  I stopped the heparin and called MD on call to inform of incident.  Patient felt flushed and hot when he was bleeding. I will keep monitoring patient.

## 2018-12-07 NOTE — Progress Notes (Signed)
IR requested by Dr. Maryland Pink for possible image-guided T7 kyphoplasty/vertebroplasty.  CT thoracic spine reviewed by Dr. Vernard Gambles who states patient is at high risk of complications from kyphoplasty procedure. States this is a pathological fracture that is potentially unstable (loss of cortex through T7 vertebral body, probable posterior involvement/fracture also). Recommends neurosurgery consultation for possible fusion. Dr. Maryland Pink aware.  IR available in future if needed.  Bea Graff Carry Weesner, PA-C 12/07/2018, 10:43 AM

## 2018-12-07 NOTE — TOC Initial Note (Signed)
Transition of Care [facility]) - Initial/Assessment Note    Patient Details  Name: Robert Gregory MRN: [id number] Date of Birth: 1940-10-07  Transition of Care [facility]) CM/SW Contact:    Bethena Roys, RN Phone Number: 12/07/2018, 2:55 PM  Clinical Narrative:  Pt presented for near syncopal episode. PTA from home with spouse. Pt has Cane and RW in the home. Patient was asleep at the time of visit. CM did call the wife and she was in cafeteria eating. CM did discuss plan of care. Per MD notes and wife pt is not a candidate for vertebroplasty. Per wife Neurosurgeon is trying to find a brace that fits adequately. Per wife patient was previously active with [facility] for RN services for wound care. CM did reach out to [facility] to clarify. Wife did ask for Hospice Agency List- placed in the room for both to look at. CM Received Call from Staff RN about how to arrange [facility] for simulation procedure at [facility]. Unsure if patient will return to [facility]. RN is aware to fil out medical necessity form and print. Print the facesheet and the EMS/[facility] transport report form. [facility] will not need EMTALA for transport. RN to call [facility] today to schedule time. No further needs from CM at this time.                  Expected Discharge Plan: Home w Hospice Care Barriers to Discharge: No Barriers Identified   Patient Goals and CMS Choice   CMS Medicare.gov Compare Post Acute Care list provided to:: Other (Comment Required)(Spouse provided Hospice List) Choice offered to / list presented to : Spouse  Expected Discharge Plan and Services Expected Discharge Plan: [place]   Discharge Planning Services: CM Consult   Living arrangements for the past 2 months: Single Family Home(Town Home)                          Prior Living Arrangements/Services Living arrangements for the past 2 months: Single Family Home(Town Home) Lives with:: Spouse Patient language and need for  interpreter reviewed:: No Do you feel safe going back to the place where you live?: Yes      Need for Family Participation in Patient Care: Yes (Comment)     Criminal Activity/Legal Involvement Pertinent to Current Situation/Hospitalization: No - Comment as needed  Activities of Daily Living      Permission Sought/Granted Permission sought to share information with : Case Manager                Emotional Assessment Appearance:: Appears stated age Attitude/Demeanor/Rapport: Unable to Assess(Asleep at time of visit. )     Alcohol / Substance Use: Not Applicable Psych Involvement: No (comment)  Admission diagnosis:  Complete heart block ([place]) [I44.2] Hyperkalemia [E87.5] Acute kidney injury ([place]) [N17.9] Patient Active Problem List   Diagnosis Date Noted  . Near syncope 12/03/2018  . Complete heart block ([place]) 12/03/2018  . Acute renal failure superimposed on stage 3 chronic kidney disease ([place]) 12/03/2018  . Hepatocellular carcinoma ([place]) 12/03/2018  . Cellulitis of right lower extremity   . Sepsis ([place]) 09/07/2018  . Iron deficiency anemia due to chronic blood loss 08/24/2018  . Iron malabsorption 08/24/2018  . Pernicious anemia 08/24/2018  . Obesity, Class III, BMI 40-49.9 (morbid obesity) ([place]) 05/06/2017  . Splenomegaly, not elsewhere classified 10/27/2016  . Renal mass, left 10/27/2016  . Abnormal nuclear stress test   .  Obstructive sleep apnea   . Nephrolithiasis   . Thrombocytopenia ([place])   . Cholelithiasis   . ICD (implantable cardioverter-defibrillator) battery depletion   . Ischemic cardiomyopathy   . Myocardial infarction ([place])   . Pneumonia   . Peripheral neuropathy   . Joint pain   . Chronic back pain   . History of colon polyps   . Urinary frequency   . History of kidney stones   . Type 2 diabetes mellitus with stage 3 chronic kidney disease ([place]) 05/19/2015  . H/O total knee replacement, right 11/29/2014  . Pre-operative cardiovascular  examination 11/20/2014  . CAD (coronary artery disease) 11/07/2013  . Atrial fibrillation ([place]) 11/07/2013  . ICD (implantable cardioverter-defibrillator) in place 11/07/2013  . Essential hypertension 11/07/2013  . Hyperlipidemia 11/07/2013  . Cardiomyopathy, ischemic 11/07/2013   PCP:  Seward Carol, MD Pharmacy:   [facility], [place] [address] [place] [address] Phone: [phone] Fax: [phone]  [place], [place] [address] Phone: [phone] Fax: [phone]     Social Determinants of Health (SDOH) Interventions    Readmission Risk Interventions No flowsheet data found.

## 2018-12-07 NOTE — Progress Notes (Signed)
This RN set up carelink to arrive on 6E for transportation to [facility] on 3/20 at 1000 per request from [place], NP.

## 2018-12-07 NOTE — Progress Notes (Signed)
PT Cancellation Note  Patient Details Name: Robert Gregory MRN: [id number] DOB: 06-06-1941   Cancelled Treatment:    Reason Eval/Treat Not Completed: Medical issues which prohibited therapy(Pt refused PT due to pain and tests today.  )Wife also informed this PT that Dr. Benay Spice said that pt does not have to wear brace at all.  Asked MD to clarify this in orders.  Will check back tomorrow.     Denice Paradise 12/07/2018, 10:55 AM  [place] Pager:  [phone]  Office:  [phone]

## 2018-12-08 ENCOUNTER — Ambulatory Visit
Admit: 2018-12-08 | Discharge: 2018-12-08 | Disposition: A | Discharge status: Home / Self Care | Payer: Medicare Other | Attending: Radiation Oncology | Admitting: Radiation Oncology

## 2018-12-08 DIAGNOSIS — C220 Liver cell carcinoma: Secondary | ICD-10-CM

## 2018-12-08 LAB — CBC
HCT: 31.7 % — ABNORMAL LOW (ref 39.0–52.0)
Hemoglobin: 9.9 g/dL — ABNORMAL LOW (ref 13.0–17.0)
MCH: 29.8 pg (ref 26.0–34.0)
MCHC: 31.2 g/dL (ref 30.0–36.0)
MCV: 95.5 fL (ref 80.0–100.0)
PLATELETS: 71 10*3/uL — AB (ref 150–400)
RBC: 3.32 MIL/uL — ABNORMAL LOW (ref 4.22–5.81)
RDW: 18.8 % — ABNORMAL HIGH (ref 11.5–15.5)
WBC: 5.3 10*3/uL (ref 4.0–10.5)
nRBC: 0 % (ref 0.0–0.2)

## 2018-12-08 LAB — GLUCOSE, CAPILLARY
Glucose-Capillary: 204 mg/dL — ABNORMAL HIGH (ref 70–99)
Glucose-Capillary: 237 mg/dL — ABNORMAL HIGH (ref 70–99)
Glucose-Capillary: 235 mg/dL — ABNORMAL HIGH (ref 70–99)

## 2018-12-08 LAB — HEPARIN LEVEL (UNFRACTIONATED): Heparin Unfractionated: 0.44 IU/mL (ref 0.30–0.70)

## 2018-12-08 MED ORDER — CEPHALEXIN 500 MG PO CAPS
500.0000 mg | ORAL_CAPSULE | Freq: Three times a day (TID) | ORAL | Status: DC
  Administered 2018-12-08 – 2018-12-15 (×21): 500 mg via ORAL
  Filled 2018-12-08 (×21): qty 1

## 2018-12-08 MED ORDER — SENNOSIDES-DOCUSATE SODIUM 8.6-50 MG PO TABS
2.0000 | ORAL_TABLET | Freq: Two times a day (BID) | ORAL | Status: DC
  Administered 2018-12-08 – 2018-12-28 (×23): 2 via ORAL
  Filled 2018-12-08 (×35): qty 2

## 2018-12-08 MED ORDER — OXYCODONE HCL ER 10 MG PO T12A
10.0000 mg | EXTENDED_RELEASE_TABLET | Freq: Two times a day (BID) | ORAL | Status: DC
  Administered 2018-12-08: 10 mg via ORAL
  Filled 2018-12-08 (×2): qty 1

## 2018-12-08 MED ORDER — RIVAROXABAN 20 MG PO TABS
20.0000 mg | ORAL_TABLET | Freq: Every day | ORAL | Status: DC
  Administered 2018-12-08 – 2018-12-17 (×10): 20 mg via ORAL
  Filled 2018-12-08 (×10): qty 1

## 2018-12-08 NOTE — Progress Notes (Signed)
Occupational Therapy Treatment Patient Details Name: Robert Gregory MRN: [id number] DOB: 1940-11-16 Today's Date: 12/08/2018    History of present illness Patient is a 78 y/o male who presents with dizziness, bradycardia and near syncope. Pt with complete heart block and metastatic hepatocellular carinoma with T7 compression fx. Not a candidate for verterbroplasty, to begin palliative radiation at [facility] 3/20. PMH includes DM, MI, ICD, HTN, HLD, A-fib.   OT comments  Pt willing to work with therapies despite back pain. Ambulated to door and sink with RW and stood at sink as OT brushed his teeth. Pt unable to release UE support in standing due to pain. Pt is a very pleasant man.  Follow Up Recommendations  CIR;Supervision/Assistance - 24 hour    Equipment Recommendations  Other (comment)(defer to next venue)    Recommendations for Other Services      Precautions / Restrictions Precautions Precautions: Fall;Back Precaution Comments: pt opting against use of brace (for comfort)       Mobility Bed Mobility Overal bed mobility: Needs Assistance Bed Mobility: Sidelying to Sit   Sidelying to sit: +2 for physical assistance;Max assist       General bed mobility comments: used log roll technique, required 2 trials to raise trunk due to pain, assist for LEs back into bed  Transfers   Equipment used: Rolling walker (2 wheeled) Transfers: Sit to/from Stand Sit to Stand: Min guard;+2 safety/equipment;From elevated surface         General transfer comment: min guard for safety    Balance Overall balance assessment: Needs assistance   Sitting balance-Leahy Scale: Fair       Standing balance-Leahy Scale: Poor Standing balance comment: heavy reliance on B UE support due to pain                           ADL either performed or assessed with clinical judgement   ADL Overall ADL's : Needs assistance/impaired     Grooming: Total assistance;Standing;Oral  care Grooming Details (indicate cue type and reason): pt bracing against back pain with B UEs on sink                                      Vision       Perception     Praxis      Cognition Arousal/Alertness: Awake/alert Behavior During Therapy: WFL for tasks assessed/performed Overall Cognitive Status: Within Functional Limits for tasks assessed                                          Exercises     Shoulder Instructions       General Comments      Pertinent Vitals/ Pain       Pain Assessment: Faces Faces Pain Scale: Hurts whole lot Pain Location: back Pain Descriptors / Indicators: Grimacing;Guarding Pain Intervention(s): Monitored during session  Home Living                                          Prior Functioning/Environment              Frequency  Min 2X/week  Progress Toward Goals  OT Goals(current goals can now be found in the care plan section)  Progress towards OT goals: Not progressing toward goals - comment(pain limiting)  Acute Rehab OT Goals Patient Stated Goal: to get this pain to decrease OT Goal Formulation: With patient/family Time For Goal Achievement: 12/19/18 Potential to Achieve Goals: Good  Plan Discharge plan remains appropriate    Co-evaluation    PT/OT/SLP Co-Evaluation/Treatment: Yes Reason for Co-Treatment: For patient/therapist safety   OT goals addressed during session: ADL's and self-care      AM-PAC OT "6 Clicks" Daily Activity     Outcome Measure   Help from another person eating meals?: None Help from another person taking care of personal grooming?: Total Help from another person toileting, which includes using toliet, bedpan, or urinal?: Total Help from another person bathing (including washing, rinsing, drying)?: A Lot Help from another person to put on and taking off regular upper body clothing?: A Lot Help from another person to put on and  taking off regular lower body clothing?: Total 6 Click Score: 11    End of Session Equipment Utilized During Treatment: Rolling walker  OT Visit Diagnosis: Unsteadiness on feet (R26.81);Other abnormalities of gait and mobility (R26.89);Muscle weakness (generalized) (M62.81);Pain   Activity Tolerance Patient limited by pain   Patient Left in bed;with call bell/phone within reach;with family/visitor present;with nursing/sitter in room   Nurse Communication          Time: 9244-6286 OT Time Calculation (min): 15 min  Charges: OT General Charges $OT Visit: 1 Visit  Nestor Lewandowsky, OTR/L Acute Rehabilitation Services Pager: [phone] Office: [phone]   Malka So 12/08/2018, 10:33 AM

## 2018-12-08 NOTE — Progress Notes (Addendum)
HEMATOLOGY-ONCOLOGY PROGRESS NOTE  SUBJECTIVE: Patient having ongoing back pain.  Has remained afebrile.  Denies chest discomfort and shortness of breath.  Reports constipation.  Small bowel movement yesterday.  Denies nausea, vomiting.  No bleeding.  REVIEW OF SYSTEMS:   Constitutional: Denies fevers, chills or abnormal weight loss Eyes: Denies blurriness of vision Ears, nose, mouth, throat, and face: Denies mucositis or sore throat Respiratory: Denies cough, dyspnea or wheezes Cardiovascular: Denies palpitation, chest discomfort Gastrointestinal:  Denies nausea, heartburn.  Reports constipation. Skin: Ongoing cellulitis to his right lower extremity. Lymphatics: Denies new lymphadenopathy Musculoskeletal: He has persistent back pain. Neurological:Denies numbness, tingling or new weaknesses Behavioral/Psych: Mood is stable, no new changes  Extremities: Reports bilateral lower extremity edema. All other systems were reviewed with the patient and are negative.  I have reviewed the past medical history, past surgical history, social history and family history with the patient and they are unchanged from previous note.   PHYSICAL EXAMINATION:  Vitals:   12/08/18 0000 12/08/18 0509  BP:  130/71  Pulse: 88 72  Resp: (!) 23   Temp:  98.6 F (37 C)  SpO2: 98% 97%   Filed Weights   12/06/18 0419 12/07/18 0613 12/08/18 0509  Weight: (!) 320 lb 5.3 oz (145.3 kg) (!) 318 lb 12.8 oz (144.6 kg) (!) 315 lb 12.8 oz (143.2 kg)    GENERAL:alert, no distress and comfortable SKIN: skin color, texture, turgor are normal.  Improving cellulitis of the right lower extremity. EYES: normal, Conjunctiva are pink and non-injected, sclera clear OROPHARYNX:no exudate, no erythema and lips, buccal mucosa, and tongue normal  NECK: supple, thyroid normal size, non-tender, without nodularity LYMPH:  no palpable lymphadenopathy in the cervical, axillary or inguinal LUNGS: clear to auscultation and  percussion with normal breathing effort HEART: regular rate & rhythm and no murmurs.  Trace bilateral lower extremity edema. ABDOMEN:abdomen soft, non-tender and normal bowel sounds Musculoskeletal:no cyanosis of digits and no clubbing  NEURO: alert & oriented x 3 with fluent speech, no focal motor/sensory deficits  LABORATORY DATA:  I have reviewed the data as listed CMP Latest Ref Rng & Units 12/07/2018 12/06/2018 12/06/2018  Glucose 70 - 99 mg/dL 226(H) 222(H) 246(H)  BUN 8 - 23 mg/dL 33(H) 35(H) 36(H)  Creatinine 0.61 - 1.24 mg/dL 1.20 1.30(H) 1.37(H)  Sodium 135 - 145 mmol/L 134(L) 135 134(L)  Potassium 3.5 - 5.1 mmol/L 4.8 5.7(H) 5.6(H)  Chloride 98 - 111 mmol/L 106 108 108  CO2 22 - 32 mmol/L 19(L) 20(L) 16(L)  Calcium 8.9 - 10.3 mg/dL 9.0 8.8(L) 8.9  Total Protein 6.5 - 8.1 g/dL - - -  Total Bilirubin 0.3 - 1.2 mg/dL - - -  Alkaline Phos 38 - 126 U/L - - -  AST 15 - 41 U/L - - -  ALT 0 - 44 U/L - - -    Lab Results  Component Value Date   WBC 5.3 12/08/2018   HGB 9.9 (L) 12/08/2018   HCT 31.7 (L) 12/08/2018   MCV 95.5 12/08/2018   PLT 71 (L) 12/08/2018   NEUTROABS 3.2 12/05/2018    ASSESSMENT AND PLAN: Hepatocellular carcinoma: AFP is greater than 3400.  GI has seen the patient and says that there was no need for liver biopsy at this time as CT scan is diagnostic for definitive hepatocellular carcinoma. Further treatment will be discussed as an outpatient per [name].  T7 fracture Could be pathologic.  Patient with severe symptoms which have really affected his functional status.  Ongoing for the past 4 weeks.  He had a CT scan of his thoracic spine done on 3/10.  CT scan of thoracic spine was performed yesterday which showed progression of the destruction of the pathologic fracture of T7 since the prior CT scan on 11/27/2018.  The partially destroyed posterior margin of T7-8 protrudes slightly into the spinal canal.  There is unchanged tumor infiltration of the majority  of the T8 vertebral body without a pathologic fracture.  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  He was noted to have a progressive small right pleural effusion and a new left pleural effusion and enlarging mediastinal lymph nodes.  The CT report indicates that there was no definitive tumor within the spinal canal although sensitivity was limited due to the lack of contrast.  The patient was evaluated interventional radiology who advised against kyphoplasty/vertebroplasty due to high risk of complications.  A neurosurgery consultation was recommended for possible fusion.  They have also seen the patient who have recommended against surgery due to high risk of complications.  They are trying to see if they can find a better back brace for the patient.  The patient had CT simulation this morning to begin radiation to his thoracic spine starting next week.  Consider transferring the patient to [facility] long hospital over the weekend to better facilitate his radiation treatments if he is going to remain inpatient.  With regard to his pain, begin OxyContin 10 mg every 12 hours.  Continue oxycodone as needed for breakthrough pain.  Recurrent right lower extremity cellulitis The patient was on cefazolin and with improvement of his cellulitis.  He will transition to cephalexin today.  ARF with hyperkalemia Baseline creatinine about 1.3.  His creatinine was noted to be elevated at admission. Medications were adjusted.    Bement from today is pending collection.  Management per hospitalist.  Normocytic anemia and thrombocytopenia Hemoglobin overall remained stable and is 9.9 today.  Platelets up slightly to 71,000.  No transfusion indicated unless platelet count drops to less than 10,000 or active bleeding.  Atrial fibrillation Chads vascular score greater than 4.  Since no procedures are planned, transition off of heparin and back to Xarelto.  Constipation Continue Colace twice a day along  with MiraLAX once a day.  Mikey Bussing, DNP, AGPCNP-BC, AOCNP Mr. Mccowen was interviewed and examined.  He continues to have back pain, limiting his mobility.  He agrees to begin a long-acting narcotic.  I prescribed OxyContin.  He will continue oxycodone as needed for breakthrough pain.  I discussed the case with Dr. Saintclair Halsted.  He has requested a new brace for Mr. Lupercio.  Mr. Winner will begin palliative radiation to the thoracic spine.  The plan is to begin systemic therapy when he is out of the hospital.  Recommendations: 1.  OxyContin/oxycodone for pain 2.  Back brace per Dr. Saintclair Halsted 3.  Increase out of bed and physical therapy as tolerated 4.  Begin palliative radiation per Dr. Sondra Come, transfer to [facility] long if he will be unable to complete outpatient radiation 5.  He is call Oncology as needed.  Oncology will check on him 12/11/2018

## 2018-12-08 NOTE — Progress Notes (Signed)
ANTICOAGULATION CONSULT NOTE - Follow Up Consult  Pharmacy Consult for Heparin Indication: atrial fibrillation  Allergies  Allergen Reactions  . Feraheme [Ferumoxytol] Other (See Comments)    Patient had  reaction during infusion in Feb 2020 decision made to change to [facility] (passed out)  . Contrast Media [Iodinated Diagnostic Agents] Rash and Other (See Comments)    Extreme flushing  . Iodine Other (See Comments)    flushing  . Ioxaglate Other (See Comments)    Extreme flushing  . Adhesive [Tape] Rash    Adhesive tapes causes redness/itching/rash after removal  . Seasonal Ic [Cholestatin] Other (See Comments)    Unknown reaction    Patient Measurements: Height: 6' 1"  (185.4 cm) Weight: (!) 318 lb 12.8 oz (144.6 kg) IBW/kg (Calculated) : 79.9 Heparin Dosing Weight: 114 kg  Vital Signs: Temp: 98.6 F (37 C) (03/20 0509) Temp Source: Oral (03/20 0509) BP: 130/71 (03/20 0509) Pulse Rate: 72 (03/20 0509)  Labs: Recent Labs    12/06/18 0310  12/06/18 1255 12/06/18 1901 12/07/18 0404 12/07/18 1058 12/08/18 0405  HGB 10.1*  --   --   --  9.9*  --  9.9*  HCT 30.7*  --   --   --  31.6*  --  31.7*  PLT 67*  --   --   --  67*  --  71*  HEPARINUNFRC 0.32  --   --   --   --  0.26* 0.44  CREATININE 1.45*   < > 1.37* 1.30* 1.20  --   --    < > = values in this interval not displayed.    Estimated Creatinine Clearance: 77.1 mL/min (by C-G formula based on SCr of 1.2 mg/dL).  Assessment: 15 yoF on Xarelto at home for hx of PAF started on IV heparin with need for procedures. Heparin stopped 3/18 briefly for bleeding from groin which resolved with pressure. Heparin level currently therapeutic, CBC stable.   Goal of Therapy:  Heparin level 0.3-0.7 units/ml Monitor platelets by anticoagulation protocol: Yes   Plan:  -Continue heparin 1650 units/hr -Recheck heparin level and CBC in am   Arrie Senate, PharmD, BCPS Clinical Pharmacist [phone] Please check AMION  for all [place] numbers 12/08/2018

## 2018-12-08 NOTE — Progress Notes (Signed)
Patient ID: Robert Gregory, male   DOB: [birth year], 78 y.o.   MRN: [id number] Overall patient doing okay no leg pain still a lot of back pain awaiting biotech to reevaluate and refit for a different brace patient set up for radiation simulation today oncology to increase pain management. Continue to support nonoperative management as patient is not a good surgical candidate.

## 2018-12-08 NOTE — Consult Note (Signed)
   [facility] Inpatient Consult   12/08/2018  Robert Gregory 1940-10-04 [id number]   Patient screened for extreme high risk score of 32% and hospitalizations to check if potential [place] Management services are needed .Chart reviewed and MD notes reveals today as follows: 78 year old male who has a past medical history of coronary artery disease status post CABG in 1997, history of pacemaker placement, history of atrial fibrillation, chronic kidney disease stage III, sleep apnea, anemia, diabetes presented with symptomatic bradycardia.  Thought to have complete heart block.  He was seen by cardiology who adjusted his pacemaker settings with improvement.  Patient was also found to have right lower extremity cellulitis.  He was also recently diagnosed with hepatocellular carcinoma.  He had back pain, he was also diagnosed as having T7 fracture. Patient's current recommendations from PT/OT is for CIR.  Patient has Medicare/Next [employer].  Primary Care Provider is  Dr. Seward Carol.  Will follow for progress.  Please place a [place] [facility] consult if [facility] programs for disposition are needed or for questions contact:   Natividad Brood, RN BSN [place] [facility] Liaison  [phone] business mobile phone Toll free office [phone]

## 2018-12-08 NOTE — Progress Notes (Signed)
RT offered to help pt place self on his home CPAP machine. Pt states he will do it and is in need of no assistance with application. RT will continue to monitor.

## 2018-12-08 NOTE — Progress Notes (Signed)
Physical Therapy Treatment Patient Details Name: Robert Gregory MRN: [id number] DOB: 16-Jul-1941 Today's Date: 12/08/2018    History of Present Illness Pt is a 78 y/o male admitted 12/03/18 with dizziness, bradycardia and near syncope. Pt with complete heart block and metastatic hepatocellular carinoma with T7 compression fx. Not a candidate for verterbroplasty, to begin palliative radiation at [facility] 3/20. PMH includes DM, MI, ICD, HTN, HLD, A-fib.   PT Comments    Pt pleasant and agreeable to participate despite significant back pain. Requires mod-maxA+2 for bed mobility due to pain. Able to amb short distance with RW and close min guard; poor postural reactions/balance strategies as pt's movements very guarded, relying heavily on BUE support to maintain standing balance. Will continue to follow acutely.   Follow Up Recommendations  CIR;Supervision for mobility/OOB     Equipment Recommendations  None recommended by PT    Recommendations for Other Services       Precautions / Restrictions Precautions Precautions: Fall;Back Precaution Comments: pt opting against use of brace (for comfort) Required Braces or Orthoses: Spinal Brace Spinal Brace: Thoracolumbosacral orthotic Restrictions Weight Bearing Restrictions: No    Mobility  Bed Mobility Overal bed mobility: Needs Assistance Bed Mobility: Rolling;Sidelying to Sit;Sit to Sidelying Rolling: Modified independent (Device/Increase time) Sidelying to sit: Mod assist;+2 for physical assistance     Sit to sidelying: Mod assist General bed mobility comments: Used log roll technique, pt able to come to sitting on 2nd trials with assist+2 for trunk elevation; unable to tolerate sitting long due to pain. ModA to assist BLEs to supine  Transfers Overall transfer level: Needs assistance Equipment used: Rolling walker (2 wheeled) Transfers: Sit to/from Stand Sit to Stand: Min guard;From elevated surface;+2 safety/equipment         General transfer comment: Close min guard for safety, reliant on momentum; pt requesting bed elevated to stand to minimize pain  Ambulation/Gait Ambulation/Gait assistance: Min guard;+2 safety/equipment Gait Distance (Feet): 40 Feet Assistive device: Rolling walker (2 wheeled) Gait Pattern/deviations: Step-through pattern;Decreased stride length;Wide base of support;Antalgic Gait velocity: Decreased Gait velocity interpretation: <1.31 ft/sec, indicative of household ambulator General Gait Details: Slow, antalgic gait with RW and close min guard for balance (+2 lines); prolonged standing at sink before return to bed to brush teeth   Stairs             Wheelchair Mobility    Modified Rankin (Stroke Patients Only)       Balance Overall balance assessment: Needs assistance Sitting-balance support: Feet supported;Bilateral upper extremity supported Sitting balance-Leahy Scale: Fair     Standing balance support: During functional activity;Bilateral upper extremity supported Standing balance-Leahy Scale: Poor Standing balance comment: heavy reliance on B UE support due to pain                            Cognition Arousal/Alertness: Awake/alert Behavior During Therapy: WFL for tasks assessed/performed Overall Cognitive Status: Within Functional Limits for tasks assessed                                        Exercises      General Comments General comments (skin integrity, edema, etc.): Wife present and supportive      Pertinent Vitals/Pain Pain Assessment: Faces Faces Pain Scale: Hurts whole lot Pain Location: L-side back Pain Descriptors / Indicators: Grimacing;Guarding Pain Intervention(s): Limited activity within  patient's tolerance;Monitored during session    Home Living                      Prior Function            PT Goals (current goals can now be found in the care plan section) Acute Rehab PT Goals Patient  Stated Goal: to get this pain to decrease PT Goal Formulation: With patient/family Time For Goal Achievement: 12/19/18 Potential to Achieve Goals: Good Progress towards PT goals: Progressing toward goals    Frequency    Min 3X/week      PT Plan Current plan remains appropriate    Co-evaluation   Reason for Co-Treatment: For patient/therapist safety   OT goals addressed during session: ADL's and self-care      AM-PAC PT "6 Clicks" Mobility   Outcome Measure  Help needed turning from your back to your side while in a flat bed without using bedrails?: A Little Help needed moving from lying on your back to sitting on the side of a flat bed without using bedrails?: A Lot Help needed moving to and from a bed to a chair (including a wheelchair)?: A Little Help needed standing up from a chair using your arms (e.g., wheelchair or bedside chair)?: A Little Help needed to walk in hospital room?: A Little Help needed climbing 3-5 steps with a railing? : Total 6 Click Score: 15    End of Session Equipment Utilized During Treatment: Gait belt Activity Tolerance: Patient limited by pain Patient left: in bed;with call bell/phone within reach;with family/visitor present;with nursing/sitter in room Nurse Communication: Mobility status PT Visit Diagnosis: Pain;Difficulty in walking, not elsewhere classified (R26.2);Muscle weakness (generalized) (M62.81);Unsteadiness on feet (R26.81)     Time: 3887-1959 PT Time Calculation (min) (ACUTE ONLY): 17 min  Charges:  $Gait Training: 8-22 mins                    Mabeline Caras, PT, DPT Acute Rehabilitation Services  Pager [phone] Office [place] 12/08/2018, 11:50 AM

## 2018-12-08 NOTE — Progress Notes (Signed)
[facility] PROGRESS NOTE  Robert Gregory BTD:[id number] DOB: 19-May-1941 DOA: 12/03/2018  PCP: Seward Carol, MD  Brief History/Interval Summary: 78 year old male who has a past medical history of coronary artery disease status post CABG in 1997, history of pacemaker placement, history of atrial fibrillation, chronic kidney disease stage III, sleep apnea, anemia, diabetes presented with symptomatic bradycardia.  Thought to have complete heart block.  He was seen by cardiology who adjusted his pacemaker settings with improvement.  Patient was also found to have right lower extremity cellulitis.  He was also recently diagnosed with hepatocellular carcinoma.  He had back pain, he was also diagnosed as having T7 fracture.  Reason for Visit: Right lower extremity cellulitis.  Pathological T7 fracture.  Consultants: Cardiology.  Gastroenterology.  Medical oncology.  Interventional radiology.  Neurosurgery.  Procedures: Pacemaker interrogation and adjustment of settings.  Antibiotics: Initially placed on vancomycin and ceftriaxone.  Currently on cefazolin.  Subjective: Patient states that increasing the frequency of the pain medication has helped however still continues to have significant pain and limited mobility as a result.  Denies any nausea vomiting.  No shortness of breath.    ROS: Denies any chest pain  Objective:  Vital Signs  Vitals:   12/07/18 1406 12/07/18 2146 12/08/18 0000 12/08/18 0509  BP: (!) 114/55 133/66  130/71  Pulse: 86 84 88 72  Resp:   (!) 23   Temp: 97.6 F (36.4 C) 98.4 F (36.9 C)  98.6 F (37 C)  TempSrc: Oral Oral  Oral  SpO2: 94% 97% 98% 97%  Weight:    (!) 143.2 kg  Height:        Intake/Output Summary (Last 24 hours) at 12/08/2018 0910 Last data filed at 12/07/2018 2015 Gross per 24 hour  Intake 1351.25 ml  Output 200 ml  Net 1151.25 ml   Filed Weights   12/06/18 0419 12/07/18 0613 12/08/18 0509  Weight: (!) 145.3 kg (!) 144.6 kg (!)  143.2 kg    General appearance: Awake alert.  In no distress, morbidly obese Resp: Clear to auscultation bilaterally.  Normal effort Cardio: S1-S2 is normal regular.  No S3-S4.  No rubs murmurs or bruit GI: Abdomen is soft.  Nontender nondistended.  Bowel sounds are present normal.  No masses organomegaly Extremities: Improved erythema right lower extremity Neurologic: Alert and oriented x3.  Able to move all his extremities.  No obvious weakness noted in the legs.     Lab Results:  Data Reviewed: I have personally reviewed following labs and imaging studies  CBC: Recent Labs  Lab 12/03/18 1417 12/04/18 0416 12/05/18 0335 12/06/18 0310 12/07/18 0404 12/08/18 0405  WBC 8.3 6.8 4.7 6.0 5.2 5.3  NEUTROABS 6.3  --  3.2  --   --   --   HGB 11.7* 10.9* 9.8* 10.1* 9.9* 9.9*  HCT 37.5* 33.3* 30.7* 30.7* 31.6* 31.7*  MCV 94.2 92.5 92.5 93.0 95.5 95.5  PLT 115* 88* 66* 67* 67* 71*    Basic Metabolic Panel: Recent Labs  Lab 12/06/18 0310 12/06/18 1141 12/06/18 1255 12/06/18 1901 12/07/18 0404  NA 135 131* 134* 135 134*  K 5.2* 6.3* 5.6* 5.7* 4.8  CL 107 106 108 108 106  CO2 19* 14* 16* 20* 19*  GLUCOSE 224* 259* 246* 222* 226*  BUN 36* 36* 36* 35* 33*  CREATININE 1.45* 1.33* 1.37* 1.30* 1.20  CALCIUM 9.0 8.7* 8.9 8.8* 9.0    GFR: Estimated Creatinine Clearance: 76.7 mL/min (by C-G formula based on  SCr of 1.2 mg/dL).  Liver Function Tests: Recent Labs  Lab 12/03/18 1417 12/05/18 0335 12/06/18 0310  AST 85* 87* 55*  ALT 94* 96* 65*  ALKPHOS 151* 125 110  BILITOT 1.0 1.2 1.5*  PROT 6.5 6.1* 6.5  ALBUMIN 3.1* 3.4* 4.0     Cardiac Enzymes: Recent Labs  Lab 12/03/18 2230 12/04/18 0416 12/04/18 1103  TROPONINI 0.06* 0.06* 0.05*     CBG: Recent Labs  Lab 12/07/18 0812 12/07/18 1249 12/07/18 1625 12/07/18 2144 12/08/18 0755  GLUCAP 189* 197* 199* 260* 204*      Radiology Studies: Ct Thoracic Spine Wo Contrast  Result Date: 12/06/2018 CLINICAL  DATA:  T7 compression fracture.  Hepatocellular carcinoma. EXAM: CT THORACIC SPINE WITHOUT CONTRAST TECHNIQUE: Multidetector CT images of the thoracic were obtained using the standard protocol without intravenous contrast. COMPARISON:  CT scan of the thoracic spine performed at [facility] on 11/27/2018 FINDINGS: Vertebrae: There is tumor destroying the T7 and T8 vertebral bodies. There is a pathologic fracture of the T7 vertebral body extending into the posterior margin of T7 with new slight protrusion of the posterior margin of T7 into the spinal canal since the prior exam. There is also paraspinal tumor around T7 and T8. There is no pathologic fracture of T8 at this time. There is no discrete tumor within the spinal canal although the lack of contrast limits definition of the interface between the soft tissues, bone, and thecal sac. There is a new tiny left pleural effusion and a progressed small right pleural effusion since the prior study. There is an 11 mm precarinal lymph node and a 11 mm left periaortic lymph node both best seen on image 73 of series 4. These appear larger than on the prior exam. 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 No acute abnormality of the remainder of the thoracic spine. Anterior osteophytes fuse multiple levels. There is degenerative disc disease in the lower cervical spine with a soft disc protrusions central and to the right at C5-6 with some compression of the thecal sac. Aortic atherosclerosis. IMPRESSION: 1. Progression of the destruction and pathologic fracture of T7 since the prior CT scan of 11/27/2018. The partially destroyed posterior margin of T7-8 protrudes slightly into the spinal canal. 2. Unchanged tumor infiltration of the majority of the T8 vertebral body without a pathologic fracture. 3. Progressive paraspinal tumor at T7 and T8. 4. Progressive small right pleural effusion. New small left pleural effusion. 5.  Enlarging mediastinal lymph nodes. 6. No definitive tumor within the spinal canal although sensitivity is limited due to the lack of contrast. Electronically Signed   By: Lorriane Shire M.D.   On: 12/06/2018 13:22     Medications:  Scheduled:  calcitonin (salmon)  1 spray Alternating Nares Daily   carvedilol  6.25 mg Oral BID WC   docusate sodium  100 mg Oral BID   gabapentin  300 mg Oral TID   insulin aspart  0-20 Units Subcutaneous TID WC   insulin aspart  0-5 Units Subcutaneous QHS   lidocaine  1 patch Transdermal Q24H   polyethylene glycol  17 g Oral Daily   sodium chloride flush  3 mL Intravenous Q12H   Continuous:  sodium chloride 50 mL/hr (12/08/18 0610)    ceFAZolin (ANCEF) IV 1 g (12/08/18 0612)   heparin 1,650 Units/hr (12/08/18 0649)   QBH:[id number] **OR** acetaminophen, methocarbamol, morphine injection, ondansetron **OR** ondansetron (ZOFRAN) IV, oxyCODONE    Assessment/Plan:  Recurrent right lower extremity cellulitis Initially placed on vancomycin and ceftriaxone.  Transitioned to cefazolin after discussions with infectious disease.  Cellulitis has improved.  Changed to oral antibiotics today.  T7 fracture with severe acute back pain CT scan of the thoracic spine was done which showed evidence for tumor involvement in T7-T8 and paraspinal areas.  Progression of the pathological fracture was noted.  Discussed with interventional radiology.  They have reviewed imaging studies and felt that he is very high risk to undergo any kind of vertebroplasty by interventional radiology.  They recommend discussing with neurosurgery as they are concerned about unstable spine.  Neurosurgery was consulted.  Appreciate their input.  Agree that patient is very high risk to undergo any kind of surgical intervention at this time.  This is due to his various comorbidities and morbid obesity.  Increased dose of oxycodone yesterday.  Discussed with Dr. Benay Spice.  Plan is to  start long-acting medications.  Radiation oncology was also consulted.  Patient did go to radiation center for simulation today.  If patient is here in the hospital through the weekend then we will need to consider transferring him to [facility] on Sunday so that he can start radiation treatments on Monday.   Bowel regimen.  Dysphagia Seen by gastroenterology.  Barium swallow does show evidence for dysmotility.  Per gastroenterology no further evaluation necessary in the hospital.  Outpatient follow-up.  Hepatocellular carcinoma, metastatic Management per medical oncology.  Complete heart block on admission Patient had a pacemaker which was interrogated and settings were adjusted by cardiology.  Seems to be stable now.  ARF with hyperkalemia Baseline creatinine about 1.3.  Creatinine was elevated during this hospitalization.  Noted to be 2.78 at admission.  He was given IV fluids with improvement.  He has had hyperkalemia as well.  He was given Lokelma x1.  Improvement in potassium level noted.  Monitor urine output.  Recheck labs tomorrow.  Normocytic anemia and thrombocytopenia Platelet counts are stable.  Hemoglobin is stable.  No evidence of overt bleeding.  History of cardiomyopathy He had a low EF previously which has improved.  Continue cardiac medications.  Hyperlipidemia Holding TriCor and statin.  Diabetes mellitus type 2 with neuropathy CBGs not very well controlled.  Apparently has been refusing his U-500 insulin.  Will discontinue for now.  Atrial fibrillation Chads vascular score greater than 4.  Patient was on Xarelto prior to admission.  He was placed on IV heparin due to possible need for procedures.  Since no procedure is planned now we can transition him back to Xarelto.  He did have some oozing from the scrotal area which appears to have subsided.    History of obstructive sleep apnea CPAP  Morbid obesity BMI is 42.26.   DVT Prophylaxis: Will be changed back  to his therapeutic doses of Xarelto Code Status: DNR Family Communication: Discussed with the patient and his wife Disposition Plan: Management as outlined above.  Needs better pain control.  It appears that he will need to go for some form of rehabilitation when he is medically ready for discharge.    LOS: 5 days   Genene Kilman [employer] on [email or web address]  12/08/2018, 9:10 AM

## 2018-12-08 NOTE — Progress Notes (Signed)
Orthopedic Tech Progress Note Patient Details:  Robert Gregory 05/07/1941 [id number] Called in brace order.  Patient ID: TASEEN MARASIGAN, male   DOB: 10/13/1940, 78 y.o.   MRN: [id number]   Janit Pagan 12/08/2018, 8:40 AM

## 2018-12-09 LAB — CBC
HCT: 30.4 % — ABNORMAL LOW (ref 39.0–52.0)
HCT: 34.7 % — ABNORMAL LOW (ref 39.0–52.0)
Hemoglobin: 9.8 g/dL — ABNORMAL LOW (ref 13.0–17.0)
Hemoglobin: 10.3 g/dL — ABNORMAL LOW (ref 13.0–17.0)
MCH: 30.5 pg (ref 26.0–34.0)
MCH: 30.2 pg (ref 26.0–34.0)
MCHC: 32.2 g/dL (ref 30.0–36.0)
MCHC: 29.7 g/dL — ABNORMAL LOW (ref 30.0–36.0)
MCV: 94.7 fL (ref 80.0–100.0)
MCV: 101.8 fL — ABNORMAL HIGH (ref 80.0–100.0)
NRBC: 0 % (ref 0.0–0.2)
Platelets: 87 10*3/uL — ABNORMAL LOW (ref 150–400)
Platelets: 67 10*3/uL — ABNORMAL LOW (ref 150–400)
RBC: 3.21 MIL/uL — ABNORMAL LOW (ref 4.22–5.81)
RBC: 3.41 MIL/uL — ABNORMAL LOW (ref 4.22–5.81)
RDW: 18.9 % — ABNORMAL HIGH (ref 11.5–15.5)
RDW: 18.8 % — ABNORMAL HIGH (ref 11.5–15.5)
WBC: 5.1 10*3/uL (ref 4.0–10.5)
WBC: 5.3 10*3/uL (ref 4.0–10.5)
nRBC: 0 % (ref 0.0–0.2)

## 2018-12-09 LAB — GLUCOSE, CAPILLARY
Glucose-Capillary: 197 mg/dL — ABNORMAL HIGH (ref 70–99)
Glucose-Capillary: 222 mg/dL — ABNORMAL HIGH (ref 70–99)
Glucose-Capillary: 238 mg/dL — ABNORMAL HIGH (ref 70–99)
Glucose-Capillary: 239 mg/dL — ABNORMAL HIGH (ref 70–99)
Glucose-Capillary: 237 mg/dL — ABNORMAL HIGH (ref 70–99)

## 2018-12-09 LAB — BASIC METABOLIC PANEL
Anion gap: 8 (ref 5–15)
BUN: 31 mg/dL — ABNORMAL HIGH (ref 8–23)
CO2: 19 mmol/L — ABNORMAL LOW (ref 22–32)
Calcium: 9.3 mg/dL (ref 8.9–10.3)
Chloride: 108 mmol/L (ref 98–111)
Creatinine, Ser: 1.04 mg/dL (ref 0.61–1.24)
GFR calc Af Amer: >60 mL/min (ref >60)
GFR calc non Af Amer: >60 mL/min (ref >60)
Glucose, Bld: 251 mg/dL — ABNORMAL HIGH (ref 70–99)
Potassium: 5.5 mmol/L — ABNORMAL HIGH (ref 3.5–5.1)
Sodium: 135 mmol/L (ref 135–145)

## 2018-12-09 MED ORDER — INSULIN ASPART 100 UNIT/ML ~~LOC~~ SOLN
0.0000 [IU] | Freq: Three times a day (TID) | SUBCUTANEOUS | Status: DC
  Administered 2018-12-10: 5 [IU] via SUBCUTANEOUS
  Administered 2018-12-10: 2 [IU] via SUBCUTANEOUS
  Administered 2018-12-11: 5 [IU] via SUBCUTANEOUS
  Administered 2018-12-11: 2 [IU] via SUBCUTANEOUS
  Administered 2018-12-12 – 2018-12-13 (×6): 5 [IU] via SUBCUTANEOUS
  Administered 2018-12-14: 3 [IU] via SUBCUTANEOUS
  Administered 2018-12-14: 5 [IU] via SUBCUTANEOUS
  Administered 2018-12-14: 2 [IU] via SUBCUTANEOUS
  Administered 2018-12-15: 3 [IU] via SUBCUTANEOUS
  Administered 2018-12-16: 7 [IU] via SUBCUTANEOUS
  Administered 2018-12-17: 3 [IU] via SUBCUTANEOUS
  Administered 2018-12-17 – 2018-12-18 (×2): 2 [IU] via SUBCUTANEOUS
  Administered 2018-12-18 – 2018-12-19 (×3): 1 [IU] via SUBCUTANEOUS
  Administered 2018-12-19: 7 [IU] via SUBCUTANEOUS
  Administered 2018-12-20 (×3): 2 [IU] via SUBCUTANEOUS
  Administered 2018-12-21 (×2): 1 [IU] via SUBCUTANEOUS
  Administered 2018-12-22: 7 [IU] via SUBCUTANEOUS
  Administered 2018-12-22: 3 [IU] via SUBCUTANEOUS
  Administered 2018-12-22: 9 [IU] via SUBCUTANEOUS
  Administered 2018-12-23: 7 [IU] via SUBCUTANEOUS
  Administered 2018-12-23 (×2): 5 [IU] via SUBCUTANEOUS

## 2018-12-09 MED ORDER — BISACODYL 10 MG RE SUPP
10.0000 mg | Freq: Once | RECTAL | Status: AC
  Administered 2018-12-09: 10 mg via RECTAL
  Filled 2018-12-09: qty 1

## 2018-12-09 MED ORDER — LACTULOSE 10 GM/15ML PO SOLN
10.0000 g | Freq: Once | ORAL | Status: AC
  Administered 2018-12-09: 10 g via ORAL
  Filled 2018-12-09: qty 15

## 2018-12-09 MED ORDER — OXYCODONE HCL 5 MG PO TABS
5.0000 mg | ORAL_TABLET | Freq: Four times a day (QID) | ORAL | Status: DC | PRN
  Administered 2018-12-10 – 2018-12-11 (×3): 5 mg via ORAL
  Filled 2018-12-09 (×3): qty 1

## 2018-12-09 NOTE — Progress Notes (Addendum)
Pt's evening CBG 238. Pt's family refusing evening SSI coverage, because they are concerned of his blood sugar dropping.

## 2018-12-09 NOTE — Progress Notes (Signed)
Pt has CPAP from home.  RT inspected for damages/defects, none were found.  Pt prefers self administration.  RT to monitor and assess as needed.

## 2018-12-09 NOTE — Progress Notes (Signed)
  Radiation Oncology         [phone] ________________________________  Name: Robert Gregory MRN: [id number]  Date: 12/08/2018  DOB: [birth year]  SIMULATION AND TREATMENT PLANNING NOTE    ICD-10-CM   1. Hepatocellular carcinoma (HCC) C22.0     DIAGNOSIS:  Stage IV hepatocellular carcinoma with painful thoracic spine metastasis  NARRATIVE:  The patient was brought to the [facility].  Identity was confirmed.  All relevant records and images related to the planned course of therapy were reviewed.  The patient freely provided informed written consent to proceed with treatment after reviewing the details related to the planned course of therapy. The consent form was witnessed and verified by the simulation staff.  Then, the patient was set-up in a stable reproducible  supine position for radiation therapy.  CT images were obtained.  Surface markings were placed.  The CT images were loaded into the planning software.  Then the target and avoidance structures were contoured.  Treatment planning then occurred.  The radiation prescription was entered and confirmed.  Then, I designed and supervised the construction of a total of 3 medically necessary complex treatment devices.  I have requested : 3D Simulation  I have requested a DVH of the following structures: GTV, spinal cord, heart, lungs, ICD in left chest. I have ordered:dose calc.  PLAN:  The patient will receive 30 Gy in 10 fractions. Treatments to Begin Monday, March 23 once he is transferred from [facility].  -----------------------------------  Blair Promise, PhD, MD

## 2018-12-09 NOTE — Progress Notes (Addendum)
PROGRESS NOTE [facility]   Robert Gregory   BJS:[id number] DOB: 11-07-1940  DOA: 12/03/2018 PCP: Seward Carol, MD   Brief Narrative:  Robert Gregory is a 78 year old male who has a past medical history of coronary artery disease status post CABG in 1997, history of pacemaker placement, history of atrial fibrillation, chronic kidney disease stage III, sleep apnea, anemia, diabetes presented with symptomatic bradycardia.  Thought to have complete heart block.  He was seen by cardiology who adjusted his pacemaker settings with improvement.  Patient was also found to have right lower extremity cellulitis.  He was also recently diagnosed with hepatocellular carcinoma.  He had back pain, he was also diagnosed as having T7 fracture.  Case was discussed with oncology and neurosurgery recommendation was to do palliative radiation as patient is not candidate for surgical procedure.  Subjective: Patient seen and examined, he is slight lethargic, wife reports some confusion and not being sharp as he normally does.  They believe that this is related to pain medication.  He reported that his pain is almost nonexistent.  He also report he is afraid of hypoglycemia as previously mainly at night his glucose normally broken down.  Requesting to adjust insulin.   Assessment & Plan: Recurrent right lower extremity cellulitis Initially treated with vancomycin and ceftriaxone, transitioned to cefazolin.  Cellulitis has improved and he currently is on oral antibiotics Keflex, and date 3/27   T7 fracture with severe acute back pain CT scan of the thoracic spine was done which showed evidence for tumor involvement in T7-T8 and paraspinal areas.  Progression of the pathological fracture was noted.    Dr. Maryland Pink discussed with interventional radiology for possible kyphoplasty however patient high risk, neurosurgery consulted and recommended against surgical intervention at this time due to comorbidities and  morbid obesity.  Oncology recommend palliative radiation and possible starting long-acting medication/chemotherapy.  Patient will be transferred to to [facility] hospital to begin radiation on Monday 23rd.  Will continue pain control.  Will adjust medication as patient was overmedicated.  Discontinue long-acting and managed with OxyIR for now.  Dysphagia Seen by GI, barium swallow was done and showed evidence of dysmotility.  Per GI no further work-up as an inpatient.  Follow-up with PCP  Hepatocellular carcinoma, metastatic Management per oncology  Complete heart block on admission Pacemaker was interrogated and settings were adjusted by cardiology.  Heart rate stable  AKI with hyperkalemia Creatinine has improved, better than baseline avoid nephrotoxic agents and monitor renal function.  Remains with good urine output.  Potassium borderline high.  Will check in a.m.  Diabetes mellitus type 2 with neuropathy CV is not well controlled, he has been refusing sliding scale, due to fear of hypoglycemia given that he is having poor oral intake.  Patient reported nocturnal hypoglycemia in the past.  Will adjust sliding scale to sensitive.  Continue long-acting as prescribed for now.  Atrial fibrillation CHADVASC 4.  Heart rate stable, continue Xarelto.  Monitor scrotal area which have some oozing, this appeared to subsided.   History of obstructive sleep apnea CPAP  Morbid obesity BMI is 42.26.  DVT prophylaxis: Xarelto Code Status: DNR Family Communication: Wife at bedside Disposition Plan: Will transfer to [facility], to be in palliative radiation.  PT also evaluated patient and recommending CIR.  Consultants:   Oncology  Neurosurgery  Radiation oncology  Cardiology  Procedures:     Antimicrobials: Anti-infectives (From admission, onward)   Start     Dose/Rate Route  Frequency Ordered Stop   12/08/18 1400  cephALEXin (KEFLEX) capsule 500 mg     500 mg Oral  Every 8 hours 12/08/18 1040     12/04/18 1500  ceFAZolin (ANCEF) IVPB 1 g/50 mL premix  Status:  Discontinued     1 g 100 mL/hr over 30 Minutes Intravenous Every 8 hours 12/04/18 1425 12/08/18 1040   12/04/18 1400  vancomycin (VANCOCIN) 1,500 mg in sodium chloride 0.9 % 500 mL IVPB  Status:  Discontinued     1,500 mg 250 mL/hr over 120 Minutes Intravenous  Once 12/04/18 1330 12/04/18 1425   12/03/18 2200  piperacillin-tazobactam (ZOSYN) IVPB 3.375 g  Status:  Discontinued     3.375 g 12.5 mL/hr over 240 Minutes Intravenous Every 8 hours 12/03/18 1732 12/04/18 1321   12/03/18 1731  vancomycin variable dose per unstable renal function (pharmacist dosing)  Status:  Discontinued      Does not apply See admin instructions 12/03/18 1732 12/04/18 1425   12/03/18 1730  piperacillin-tazobactam (ZOSYN) IVPB 3.375 g  Status:  Discontinued     3.375 g 100 mL/hr over 30 Minutes Intravenous  Once 12/03/18 1725 12/04/18 1425   12/03/18 1730  vancomycin (VANCOCIN) IVPB 1000 mg/200 mL premix  Status:  Discontinued     1,000 mg 200 mL/hr over 60 Minutes Intravenous  Once 12/03/18 1725 12/03/18 1728   12/03/18 1730  vancomycin (VANCOCIN) 2,000 mg in sodium chloride 0.9 % 500 mL IVPB     2,000 mg 250 mL/hr over 120 Minutes Intravenous  Once 12/03/18 1728 12/03/18 2049          Objective: Vitals:   12/08/18 2114 12/09/18 0600 12/09/18 0644 12/09/18 1456  BP: (!) 96/49  (!) 123/53 (!) 123/53  Pulse: 70   75  Resp: 18     Temp: 98 F (36.7 C)   (!) 96.7 F (35.9 C)  TempSrc: Oral   Axillary  SpO2: 96%   95%  Weight:  (!) 146.3 kg    Height:        Intake/Output Summary (Last 24 hours) at 12/09/2018 1548 Last data filed at 12/09/2018 0600 Gross per 24 hour  Intake 1500 ml  Output -  Net 1500 ml   Filed Weights   12/07/18 0613 12/08/18 0509 12/09/18 0600  Weight: (!) 144.6 kg (!) 143.2 kg (!) 146.3 kg    Examination:  General exam: Appears calm and comfortable  Respiratory system:  Clear to auscultation. No wheezes,crackle or rhonchi Cardiovascular system: S1 & S2 heard, RRR. No JVD, murmurs, rubs or gallops Gastrointestinal system: Abdomen is nondistended, soft and nontender. No organomegaly or masses felt. Normal bowel sounds heard. Central nervous system: Alert and oriented.  Slight lethargic Extremities: Erythema has improved in the right lower extremity. Symmetric, strength 5/5    Data Reviewed: I have personally reviewed following labs and imaging studies  CBC: Recent Labs  Lab 12/03/18 1417  12/05/18 0335 12/06/18 0310 12/07/18 0404 12/08/18 0405 12/09/18 0238  WBC 8.3   < > 4.7 6.0 5.2 5.3 5.1  NEUTROABS 6.3  --  3.2  --   --   --   --   HGB 11.7*   < > 9.8* 10.1* 9.9* 9.9* 9.8*  HCT 37.5*   < > 30.7* 30.7* 31.6* 31.7* 30.4*  MCV 94.2   < > 92.5 93.0 95.5 95.5 94.7  PLT 115*   < > 66* 67* 67* 71* 87*   < > = values in this interval not  displayed.   Basic Metabolic Panel: Recent Labs  Lab 12/06/18 1141 12/06/18 1255 12/06/18 1901 12/07/18 0404 12/09/18 0238  NA 131* 134* 135 134* 135  K 6.3* 5.6* 5.7* 4.8 5.5*  CL 106 108 108 106 108  CO2 14* 16* 20* 19* 19*  GLUCOSE 259* 246* 222* 226* 251*  BUN 36* 36* 35* 33* 31*  CREATININE 1.33* 1.37* 1.30* 1.20 1.04  CALCIUM 8.7* 8.9 8.8* 9.0 9.3   GFR: Estimated Creatinine Clearance: 89.6 mL/min (by C-G formula based on SCr of 1.04 mg/dL). Liver Function Tests: Recent Labs  Lab 12/03/18 1417 12/05/18 0335 12/06/18 0310  AST 85* 87* 55*  ALT 94* 96* 65*  ALKPHOS 151* 125 110  BILITOT 1.0 1.2 1.5*  PROT 6.5 6.1* 6.5  ALBUMIN 3.1* 3.4* 4.0   No results for input(s): LIPASE, AMYLASE in the last 168 hours. No results for input(s): AMMONIA in the last 168 hours. Coagulation Profile: No results for input(s): INR, PROTIME in the last 168 hours. Cardiac Enzymes: Recent Labs  Lab 12/03/18 2230 12/04/18 0416 12/04/18 1103  TROPONINI 0.06* 0.06* 0.05*   BNP (last 3 results) No results for  input(s): PROBNP in the last 8760 hours. HbA1C: No results for input(s): HGBA1C in the last 72 hours. CBG: Recent Labs  Lab 12/07/18 2144 12/08/18 0755 12/08/18 1255 12/08/18 1519 12/09/18 0732  GLUCAP 260* 204* 237* 235* 197*   Lipid Profile: No results for input(s): CHOL, HDL, LDLCALC, TRIG, CHOLHDL, LDLDIRECT in the last 72 hours. Thyroid Function Tests: No results for input(s): TSH, T4TOTAL, FREET4, T3FREE, THYROIDAB in the last 72 hours. Anemia Panel: No results for input(s): VITAMINB12, FOLATE, FERRITIN, TIBC, IRON, RETICCTPCT in the last 72 hours. Sepsis Labs: Recent Labs  Lab 12/03/18 1625  LATICACIDVEN 1.8    No results found for this or any previous visit (from the past 240 hour(s)).    Radiology Studies: No results found.   Scheduled Meds: . calcitonin (salmon)  1 spray Alternating Nares Daily  . carvedilol  6.25 mg Oral BID WC  . cephALEXin  500 mg Oral Q8H  . gabapentin  300 mg Oral TID  . insulin aspart  0-9 Units Subcutaneous TID WC  . lidocaine  1 patch Transdermal Q24H  . polyethylene glycol  17 g Oral Daily  . rivaroxaban  20 mg Oral Q supper  . senna-docusate  2 tablet Oral BID  . sodium chloride flush  3 mL Intravenous Q12H   Continuous Infusions: . sodium chloride 50 mL/hr (12/08/18 0610)     LOS: 6 days    Time spent: Total of 35 minutes spent with pt, greater than 50% of which was spent in discussion of  treatment, counseling and coordination of care   Chipper Oman, MD  How to contact the [facility] Attending or Consulting provider [place] or covering provider during after hours [place], for this patient?  1. Check the care team in [facility] and look for a) attending/consulting [facility] provider listed and b) the [facility] team listed 2. Log into [email or web address] and use [place]'s universal password to access. If you do not have the password, please contact the hospital operator. 3. Locate the [facility] provider you are looking for under [facility] and page  to a number that you can be directly reached. 4. If you still have difficulty reaching the provider, please page the [facility] (Director on Call) for the Hospitalists listed on amion for assistance.

## 2018-12-09 NOTE — Progress Notes (Signed)
Pt transferred to [facility] via [place]. Report given to Lattie Haw, Therapist, sports.  Lenna Sciara, RN, BSN

## 2018-12-10 LAB — BASIC METABOLIC PANEL
Anion gap: 8 (ref 5–15)
BUN: 31 mg/dL — ABNORMAL HIGH (ref 8–23)
CO2: 20 mmol/L — ABNORMAL LOW (ref 22–32)
Calcium: 9.4 mg/dL (ref 8.9–10.3)
Chloride: 110 mmol/L (ref 98–111)
Creatinine, Ser: 0.98 mg/dL (ref 0.61–1.24)
GFR calc Af Amer: >60 mL/min (ref >60)
GFR calc non Af Amer: >60 mL/min (ref >60)
GLUCOSE: 210 mg/dL — AB (ref 70–99)
Potassium: 5 mmol/L (ref 3.5–5.1)
Sodium: 138 mmol/L (ref 135–145)

## 2018-12-10 LAB — CBC
HEMATOCRIT: 32.7 % — AB (ref 39.0–52.0)
Hemoglobin: 9.9 g/dL — ABNORMAL LOW (ref 13.0–17.0)
MCH: 30.7 pg (ref 26.0–34.0)
MCHC: 30.3 g/dL (ref 30.0–36.0)
MCV: 101.6 fL — ABNORMAL HIGH (ref 80.0–100.0)
Platelets: 69 10*3/uL — ABNORMAL LOW (ref 150–400)
RBC: 3.22 MIL/uL — ABNORMAL LOW (ref 4.22–5.81)
RDW: 19 % — ABNORMAL HIGH (ref 11.5–15.5)
WBC: 4.8 10*3/uL (ref 4.0–10.5)
nRBC: 0 % (ref 0.0–0.2)

## 2018-12-10 LAB — GLUCOSE, CAPILLARY
GLUCOSE-CAPILLARY: 248 mg/dL — AB (ref 70–99)
Glucose-Capillary: 238 mg/dL — ABNORMAL HIGH (ref 70–99)
Glucose-Capillary: 270 mg/dL — ABNORMAL HIGH (ref 70–99)
Glucose-Capillary: 192 mg/dL — ABNORMAL HIGH (ref 70–99)
Glucose-Capillary: 262 mg/dL — ABNORMAL HIGH (ref 70–99)
Glucose-Capillary: 259 mg/dL — ABNORMAL HIGH (ref 70–99)

## 2018-12-10 MED ORDER — FENTANYL 12 MCG/HR TD PT72
1.0000 | MEDICATED_PATCH | TRANSDERMAL | Status: DC
  Administered 2018-12-10: 1 via TRANSDERMAL
  Filled 2018-12-10: qty 1

## 2018-12-10 MED ORDER — GLUCERNA SHAKE PO LIQD
237.0000 mL | Freq: Three times a day (TID) | ORAL | Status: DC
  Administered 2018-12-11 – 2018-12-28 (×38): 237 mL via ORAL
  Filled 2018-12-10 (×58): qty 237

## 2018-12-10 NOTE — Progress Notes (Signed)
PROGRESS NOTE    Robert Gregory  YJE:[id number] DOB: 15-Sep-1941 DOA: 12/03/2018 PCP: Seward Carol, MD   Brief Narrative:  78 year old with past medical history relevant for coronary artery disease status post CABG, status post pacemaker placement, atrial fibrillation on rivaroxaban, stage III CKD, obstructive sleep apnea, type 2 diabetes, [name] cirrhosis, recent diagnosis of metastatic hepatocellular carcinoma with disease to thoracic spine, hypertension admitted with near syncope and found to be in complete heart block status post pacemaker adjustment.  His hospital course has been complicated by bilateral lower extremity cellulitis, AKI on CKD complicated by hyperkalemia, dysphasia found to have esophageal dysmotility, recalcitrant back pain due to T7 pathologic fracture transferred to [facility] on 12/10/2018 for initiation of radiation therapy.   Assessment & Plan:   Principal Problem:   Near syncope Active Problems:   Essential hypertension   Hyperlipidemia   Type 2 diabetes mellitus with stage 3 chronic kidney disease (HCC)   Chronic back pain   Obesity, Class III, BMI 40-49.9 (morbid obesity) (HCC)   Cellulitis of right lower extremity   Complete heart block (HCC)   Acute renal failure superimposed on stage 3 chronic kidney disease (HCC)   Hepatocellular carcinoma (HCC)   #) Metastatic hepatocellular carcinoma/recalcitrant T7 pain due to pathologic fracture: Patient continues to report that his pain is somewhat better controlled.  He is quite sensitive to pain medication becomes altered quite quickly. -Continue oral oxycodone as needed -Start fentanyl patch -Continue lidocaine patch -Continue gabapentin 300 mg 3 times daily -Oncology following, [name] -Radiation oncology following, plan for radiation to start tomorrow -Neurosurgery did not feel that there was any surgical options for the patient due to high risk of decompensation  #) Bilateral lower extremity  cellulitis: This appears to have largely resolved. -Continue p.o. cephalexin  #) Dysphagia: -Swallow study showed evidence of nonspecific esophageal dysmotility -Gastroenterology has signed off  #) Complete heart block status post pacemaker/near syncope: Patient initially presented with near syncope and was found to be in complete heart block.  Patient did have pacemaker changed from VVI to DDD -Continue telemetry -Cardiology signed off, appreciate recommendations  #) AKI on CKD stage III/hyperkalemia: This is resolved with IV fluids. -Avoid nephrotoxins  #) Type 2 diabetes complicated by neuropathy: Patient's blood sugars are improved on sliding scale insulin.  He is on ultra concentrated U5 100 regular at home. -Continue sliding scale insulin -Carb restricted diet  #) Coronary artery disease status post CABG/hypertension/hyperlipidemia: -Continue carvedilol 6.25 mg twice daily  #) Paroxysmal atrial fibrillation: -Continue rivaroxaban  #) OSA: -Continue CPAP nightly  Fluids: Tolerating p.o. Electrolytes: Monitor and supplement Nutrition: Carb restricted diet  Disposition: Pending evaluation by [facility] inpatient rehab  DO NOT RESUSCITATE     Consultants:   Neurosurgery  Cardiology  Oncology, [name]  Radiation oncology  Gastroenterology, Sadie Haber  Physical medicine rehabilitation  Procedures:   Pacemaker transition from VVI to DDD pacing on 12/03/2018  Esophagram 12/05/2018 nonspecific esophageal motility disorder  Antimicrobials:   P.o. cephalexin started 12/08/2018  IV cefazolin 12/03/2020 to 12/08/2018  IV vancomycin and Zosyn 12/03/2018 to 12/04/2018   Subjective: This morning the patient continues toward pain in a bandlike area extending from his thoracic spine underneath his costal margin.  He denies any nausea, vomiting, diarrhea, cough, chest pain, congestion.  He is eager to start treatment for his radiation.  Objective: Vitals:   12/09/18 1826  12/09/18 1948 12/09/18 2014 12/10/18 0509  BP: 139/77  126/71 (!) 116/50  Pulse: 75  74 70  Resp: (!) 24 20 20 16   Temp: 98.4 F (36.9 C)  97.7 F (36.5 C) 98.3 F (36.8 C)  TempSrc: Oral  Oral Oral  SpO2: 92%  95% 100%  Weight:      Height:        Intake/Output Summary (Last 24 hours) at 12/10/2018 0954 Last data filed at 12/09/2018 2200 Gross per 24 hour  Intake 450 ml  Output -  Net 450 ml   Filed Weights   12/07/18 0613 12/08/18 0509 12/09/18 0600  Weight: (!) 144.6 kg (!) 143.2 kg (!) 146.3 kg    Examination:  General exam: Appears calm and comfortable  Respiratory system: No increased work of breathing, anterior lung sounds clear, no wheezes, crackles, rhonchi Cardiovascular system: Regular rate and rhythm, no murmurs Gastrointestinal system: Soft, nondistended, no rebound or guarding, plus bowel sounds. Central nervous system: Alert and oriented.  Bilateral lower extremity strength is 5 out of 5, upper cavity strength is 5 out of 5 Extremities: 1+ lower extremity edema Skin: No rashes over visible skin Psychiatry: Judgement and insight appear normal. Mood & affect appropriate.     Data Reviewed: I have personally reviewed following labs and imaging studies  CBC: Recent Labs  Lab 12/03/18 1417  12/05/18 0335 12/06/18 0310 12/07/18 0404 12/08/18 0405 12/09/18 0238 12/09/18 1951  WBC 8.3   < > 4.7 6.0 5.2 5.3 5.1 5.3  NEUTROABS 6.3  --  3.2  --   --   --   --   --   HGB 11.7*   < > 9.8* 10.1* 9.9* 9.9* 9.8* 10.3*  HCT 37.5*   < > 30.7* 30.7* 31.6* 31.7* 30.4* 34.7*  MCV 94.2   < > 92.5 93.0 95.5 95.5 94.7 101.8*  PLT 115*   < > 66* 67* 67* 71* 87* 67*   < > = values in this interval not displayed.   Basic Metabolic Panel: Recent Labs  Lab 12/06/18 1141 12/06/18 1255 12/06/18 1901 12/07/18 0404 12/09/18 0238  NA 131* 134* 135 134* 135  K 6.3* 5.6* 5.7* 4.8 5.5*  CL 106 108 108 106 108  CO2 14* 16* 20* 19* 19*  GLUCOSE 259* 246* 222* 226* 251*   BUN 36* 36* 35* 33* 31*  CREATININE 1.33* 1.37* 1.30* 1.20 1.04  CALCIUM 8.7* 8.9 8.8* 9.0 9.3   GFR: Estimated Creatinine Clearance: 89.6 mL/min (by C-G formula based on SCr of 1.04 mg/dL). Liver Function Tests: Recent Labs  Lab 12/03/18 1417 12/05/18 0335 12/06/18 0310  AST 85* 87* 55*  ALT 94* 96* 65*  ALKPHOS 151* 125 110  BILITOT 1.0 1.2 1.5*  PROT 6.5 6.1* 6.5  ALBUMIN 3.1* 3.4* 4.0   No results for input(s): LIPASE, AMYLASE in the last 168 hours. No results for input(s): AMMONIA in the last 168 hours. Coagulation Profile: No results for input(s): INR, PROTIME in the last 168 hours. Cardiac Enzymes: Recent Labs  Lab 12/03/18 2230 12/04/18 0416 12/04/18 1103  TROPONINI 0.06* 0.06* 0.05*   BNP (last 3 results) No results for input(s): PROBNP in the last 8760 hours. HbA1C: No results for input(s): HGBA1C in the last 72 hours. CBG: Recent Labs  Lab 12/09/18 1625 12/09/18 1835 12/09/18 1843 12/09/18 2149 12/10/18 0816  GLUCAP 222* 239* 238* 237* 192*   Lipid Profile: No results for input(s): CHOL, HDL, LDLCALC, TRIG, CHOLHDL, LDLDIRECT in the last 72 hours. Thyroid Function Tests: No results for input(s): TSH, T4TOTAL, FREET4, T3FREE, THYROIDAB in the last  72 hours. Anemia Panel: No results for input(s): VITAMINB12, FOLATE, FERRITIN, TIBC, IRON, RETICCTPCT in the last 72 hours. Sepsis Labs: Recent Labs  Lab 12/03/18 1625  LATICACIDVEN 1.8    No results found for this or any previous visit (from the past 240 hour(s)).       Radiology Studies: No results found.      Scheduled Meds: . calcitonin (salmon)  1 spray Alternating Nares Daily  . carvedilol  6.25 mg Oral BID WC  . cephALEXin  500 mg Oral Q8H  . fentaNYL  1 patch Transdermal Q72H  . gabapentin  300 mg Oral TID  . insulin aspart  0-9 Units Subcutaneous TID WC  . lidocaine  1 patch Transdermal Q24H  . polyethylene glycol  17 g Oral Daily  . rivaroxaban  20 mg Oral Q supper  .  senna-docusate  2 tablet Oral BID  . sodium chloride flush  3 mL Intravenous Q12H   Continuous Infusions:   LOS: 7 days    Time spent: [place], MD [facility] If 7PM-7AM, please contact night-coverage [email or web address] Password [email or web address] 12/10/2018, 9:54 AM

## 2018-12-11 ENCOUNTER — Encounter (HOSPITAL_COMMUNITY): Payer: Self-pay | Admitting: *Deleted

## 2018-12-11 ENCOUNTER — Ambulatory Visit
Admit: 2018-12-11 | Discharge: 2018-12-11 | Disposition: A | Discharge status: Home / Self Care | Payer: Medicare Other | Attending: Radiation Oncology | Admitting: Radiation Oncology

## 2018-12-11 ENCOUNTER — Ambulatory Visit: Payer: Medicare Other

## 2018-12-11 DIAGNOSIS — C220 Liver cell carcinoma: Secondary | ICD-10-CM

## 2018-12-11 DIAGNOSIS — C7951 Secondary malignant neoplasm of bone: Secondary | ICD-10-CM | POA: Insufficient documentation

## 2018-12-11 LAB — CBC
HCT: 34.2 % — ABNORMAL LOW (ref 39.0–52.0)
Hemoglobin: 10.2 g/dL — ABNORMAL LOW (ref 13.0–17.0)
MCH: 30.4 pg (ref 26.0–34.0)
MCHC: 29.8 g/dL — ABNORMAL LOW (ref 30.0–36.0)
MCV: 101.8 fL — ABNORMAL HIGH (ref 80.0–100.0)
Platelets: 70 10*3/uL — ABNORMAL LOW (ref 150–400)
RBC: 3.36 MIL/uL — ABNORMAL LOW (ref 4.22–5.81)
RDW: 19.1 % — ABNORMAL HIGH (ref 11.5–15.5)
WBC: 5.6 10*3/uL (ref 4.0–10.5)
nRBC: 0 % (ref 0.0–0.2)

## 2018-12-11 LAB — GLUCOSE, CAPILLARY
Glucose-Capillary: 304 mg/dL — ABNORMAL HIGH (ref 70–99)
Glucose-Capillary: 200 mg/dL — ABNORMAL HIGH (ref 70–99)
Glucose-Capillary: 258 mg/dL — ABNORMAL HIGH (ref 70–99)
Glucose-Capillary: 296 mg/dL — ABNORMAL HIGH (ref 70–99)
Glucose-Capillary: 346 mg/dL — ABNORMAL HIGH (ref 70–99)

## 2018-12-11 MED ORDER — SODIUM BICARBONATE/SODIUM CHLORIDE MOUTHWASH
Freq: Four times a day (QID) | OROMUCOSAL | Status: DC
  Administered 2018-12-11 – 2018-12-13 (×10): via OROMUCOSAL
  Administered 2018-12-14: 1 via OROMUCOSAL
  Administered 2018-12-14 – 2018-12-21 (×21): via OROMUCOSAL
  Administered 2018-12-22: 1 via OROMUCOSAL
  Administered 2018-12-22 – 2018-12-25 (×12): via OROMUCOSAL
  Administered 2018-12-27: 1 via OROMUCOSAL
  Administered 2018-12-27 – 2018-12-28 (×5): via OROMUCOSAL
  Filled 2018-12-11 (×3): qty 1000

## 2018-12-11 MED ORDER — OXYCODONE HCL 5 MG PO TABS
2.5000 mg | ORAL_TABLET | Freq: Four times a day (QID) | ORAL | Status: DC | PRN
  Administered 2018-12-11 – 2018-12-13 (×4): 5 mg via ORAL
  Administered 2018-12-16: 2.5 mg via ORAL
  Filled 2018-12-11 (×5): qty 1

## 2018-12-11 MED ORDER — ZOLEDRONIC ACID 4 MG/5ML IV CONC
4.0000 mg | Freq: Once | INTRAVENOUS | Status: AC
  Administered 2018-12-11: 4 mg via INTRAVENOUS
  Filled 2018-12-11: qty 5

## 2018-12-11 MED ORDER — NYSTATIN 100000 UNIT/ML MT SUSP
5.0000 mL | Freq: Every day | OROMUCOSAL | Status: DC
  Administered 2018-12-11 – 2018-12-28 (×93): 500000 [IU] via ORAL
  Filled 2018-12-11 (×91): qty 5

## 2018-12-11 MED ORDER — DEXAMETHASONE 4 MG PO TABS
8.0000 mg | ORAL_TABLET | Freq: Every day | ORAL | Status: DC
  Administered 2018-12-11 – 2018-12-19 (×9): 8 mg via ORAL
  Filled 2018-12-11 (×9): qty 2

## 2018-12-11 NOTE — Progress Notes (Signed)
Inpatient Rehabilitation-Admissions Coordinator   Spoke with pt's wife via phone this morning to touch base on plan of care. AC will continue to follow along at a distance while pt continues with radiation therapy at [facility]. At this time, pt's family feel it is too much for him to be transported from [facility] to [facility] daily for CIR. AC will follow and reassess need for CIR after completion of medical workup.   Please call if questions.  Jhonnie Garner, OTR/L  Rehab Admissions Coordinator  [phone] 12/11/2018 10:43 AM'

## 2018-12-11 NOTE — Progress Notes (Signed)
  Radiation Oncology         ([phone]) [phone] ________________________________  Name: Robert Gregory MRN: [id number]  Date: 12/11/2018  DOB: [birth year]  Simulation Verification Note    ICD-10-CM   1. Hepatocellular carcinoma ([place]) C22.0   2. Osseous metastasis (HCC) C79.51     Status: inpatient  NARRATIVE: The patient was brought to the treatment unit and placed in the planned treatment position. The clinical setup was verified. Then port films were obtained and uploaded to the radiation oncology medical record software.  The treatment beams were carefully compared against the planned radiation fields. The position location and shape of the radiation fields was reviewed. They targeted volume of tissue appears to be appropriately covered by the radiation beams. Organs at risk appear to be excluded as planned.  Based on my personal review, I approved the simulation verification. The patient's treatment will proceed as planned.  -----------------------------------  Blair Promise, PhD, MD

## 2018-12-11 NOTE — Progress Notes (Addendum)
Physical Therapy Treatment Patient Details Name: Robert Gregory MRN: [id number] DOB: 1941-03-23 Today's Date: 12/11/2018    History of Present Illness Pt is a 78 y/o male admitted 12/03/18 with dizziness, bradycardia and near syncope. Pt with complete heart block and metastatic hepatocellular carinoma with T7 compression fx. Not a candidate for verterbroplasty, to begin palliative radiation at [facility] 3/20. PMH includes DM, MI, ICD, HTN, HLD, A-fib.    PT Comments    Pt with improved tolerance for activity this session, requires frequent verbal cuing for form and safety during ambulation. Pt's main limiting factor at this point in back pain, pt educated on back precautions as pt with limited recall of precautions. PT to continue to follow acutely.   Pt positioned in right sidelying with pillow between legs for spinal protection.    Follow Up Recommendations  CIR;Supervision for mobility/OOB     Equipment Recommendations  None recommended by PT    Recommendations for Other Services       Precautions / Restrictions Precautions Precautions: Fall;Back Precaution Comments: pt opting against use of brace (for comfort) - pt states MD stated this was okay. Reviewed back precautions with pt this session to protect spine (no bending, twisting, arching spine; no lifting) Required Braces or Orthoses: Spinal Brace Spinal Brace: Thoracolumbosacral orthotic Restrictions Weight Bearing Restrictions: No    Mobility  Bed Mobility Overal bed mobility: Needs Assistance Bed Mobility: Rolling;Sidelying to Sit;Sit to Sidelying Rolling: Min assist Sidelying to sit: Mod assist;+2 for safety/equipment     Sit to sidelying: Mod assist;+2 for safety/equipment General bed mobility comments: Log roll technique utilized, assist for trunk translation with rolling. Mod assist for sidelying<>sit for LE lifting and translation, trunk elevation, and coordiation of trunk and LE  movement.  Transfers Overall transfer level: Needs assistance Equipment used: Rolling walker (2 wheeled) Transfers: Sit to/from Stand Sit to Stand: Min assist;+2 safety/equipment;From elevated surface         General transfer comment: Min assist for initial power up, pt with self-steadying upon standing. Verbal cuing to use at least one UE to push from bed for stability.   Ambulation/Gait Ambulation/Gait assistance: Min guard;+2 safety/equipment Gait Distance (Feet): 65 Feet Assistive device: Rolling walker (2 wheeled) Gait Pattern/deviations: Step-through pattern;Decreased stride length;Wide base of support;Antalgic;Trunk flexed Gait velocity: decr    General Gait Details: Min guard for safety, antalgic due to back pain. Verbal cuing for hip extension and upright posture, placement inside RW x3.    Stairs             Wheelchair Mobility    Modified Rankin (Stroke Patients Only)       Balance Overall balance assessment: Needs assistance Sitting-balance support: Feet supported;Bilateral upper extremity supported Sitting balance-Leahy Scale: Fair     Standing balance support: Bilateral upper extremity supported Standing balance-Leahy Scale: Poor Standing balance comment: relies on RW for support                             Cognition Arousal/Alertness: Awake/alert Behavior During Therapy: WFL for tasks assessed/performed Overall Cognitive Status: Within Functional Limits for tasks assessed                                 General Comments: pt in good spirits and pleasant with PT.       Exercises General Exercises - Lower Extremity Ankle Circles/Pumps: AROM;Both;20 reps;Sidelying Gluteal  Sets: AROM;Both;10 reps;Sidelying    General Comments        Pertinent Vitals/Pain Pain Assessment: 0-10 Pain Score: 7  Pain Location: back  Pain Descriptors / Indicators: Grimacing;Guarding;Sore Pain Intervention(s): Limited activity within  patient's tolerance;Monitored during session;Repositioned    Home Living                      Prior Function            PT Goals (current goals can now be found in the care plan section) Acute Rehab PT Goals Patient Stated Goal: to get this pain to decrease PT Goal Formulation: With patient/family Time For Goal Achievement: 12/19/18 Potential to Achieve Goals: Good Progress towards PT goals: Progressing toward goals    Frequency    Min 3X/week      PT Plan Current plan remains appropriate    Co-evaluation              AM-PAC PT "6 Clicks" Mobility   Outcome Measure  Help needed turning from your back to your side while in a flat bed without using bedrails?: A Little Help needed moving from lying on your back to sitting on the side of a flat bed without using bedrails?: A Lot Help needed moving to and from a bed to a chair (including a wheelchair)?: A Little Help needed standing up from a chair using your arms (e.g., wheelchair or bedside chair)?: A Little Help needed to walk in hospital room?: A Little Help needed climbing 3-5 steps with a railing? : A Lot 6 Click Score: 16    End of Session Equipment Utilized During Treatment: Gait belt(gait belt placed above pectoral region) Activity Tolerance: Patient limited by fatigue;Patient limited by pain Patient left: in bed;with bed alarm set;with call bell/phone within reach Nurse Communication: Mobility status PT Visit Diagnosis: Pain;Muscle weakness (generalized) (M62.81);Unsteadiness on feet (R26.81) Pain - part of body: (back)     Time: 3875-6433 PT Time Calculation (min) (ACUTE ONLY): 16 min  Charges:  $Gait Training: 8-22 mins                     [name], PT Acute Rehabilitation Services Pager [phone]  Office [phone]   Robert Gregory D Robert Gregory 12/11/2018, 1:34 PM

## 2018-12-11 NOTE — Progress Notes (Signed)
Occupational Therapy Treatment Patient Details Name: Robert Gregory MRN: [id number] DOB: [birth year] Today's Date: 12/11/2018    History of present illness Pt is a 78 y/o male admitted 12/03/18 with dizziness, bradycardia and near syncope. Pt with complete heart block and metastatic hepatocellular carinoma with T7 compression fx. Not a candidate for verterbroplasty, to begin palliative radiation at [facility] 3/20. PMH includes DM, MI, ICD, HTN, HLD, A-fib.   OT comments  OT session focused on bed mobility.  Pt very interested in making sure he was moving around correctly in the bed.( log roll )  Follow Up Recommendations  CIR;Supervision/Assistance - 24 hour    Equipment Recommendations  Other (comment)    Recommendations for Other Services      Precautions / Restrictions Precautions Precautions: Fall;Back Precaution Comments: pt opting against use of brace (for comfort) - pt states MD stated this was okay. Reviewed back precautions with pt this session to protect spine (no bending, twisting, arching spine; no lifting) Required Braces or Orthoses: Spinal Brace Spinal Brace: Thoracolumbosacral orthotic Restrictions Weight Bearing Restrictions: No       Mobility Bed Mobility Overal bed mobility: Needs Assistance Bed Mobility: Rolling Rolling: Supervision;Min guard         General bed mobility comments: VC for safety with log roll - focused on technique and use of bed rails.    Transfers            NT              ADL either performed or assessed with clinical judgement   ADL Overall ADL's : Needs assistance/impaired                                       General ADL Comments: Pt wanted to focus on bed mobilty this OT session as he was going for his first treatment and wanted to make sure he was moving correctly in the bed .                Cognition Arousal/Alertness: Awake/alert Behavior During Therapy: WFL for tasks  assessed/performed Overall Cognitive Status: Within Functional Limits for tasks assessed                                                     Pertinent Vitals/ Pain       Pain Score: 3  Pain Location: back  Pain Descriptors / Indicators: Sore Pain Intervention(s): Limited activity within patient's tolerance;Repositioned;Monitored during session      Progress Toward Goals  OT Goals(current goals can now be found in the care plan section)  Progress towards OT goals: Progressing toward goals     Plan Discharge plan remains appropriate       AM-PAC OT "6 Clicks" Daily Activity     Outcome Measure   Help from another person eating meals?: None Help from another person taking care of personal grooming?: A Lot Help from another person toileting, which includes using toliet, bedpan, or urinal?: Total Help from another person bathing (including washing, rinsing, drying)?: A Lot Help from another person to put on and taking off regular upper body clothing?: A Lot Help from another person to put on and taking off regular lower body clothing?: Total 6 Click  Score: 12    End of Session    OT Visit Diagnosis: Unsteadiness on feet (R26.81);Other abnormalities of gait and mobility (R26.89);Muscle weakness (generalized) (M62.81);Pain   Activity Tolerance Patient tolerated treatment well   Patient Left in bed;with call bell/phone within reach(pt going for his radiation)   Nurse Communication Mobility status        Time: 0016-4290 OT Time Calculation (min): 13 min  Charges: OT General Charges $OT Visit: 1 Visit OT Treatments $Therapeutic Activity: 8-22 mins  Kari Baars, [place] [phone] Office- [phone], Edwena Felty D 12/11/2018, 5:45 PM

## 2018-12-11 NOTE — Care Management Important Message (Signed)
Important Message  Patient Details  Name: Robert Gregory MRN: [id number] Date of Birth: 1941/05/10   Medicare Important Message Given:  Yes    [name] 12/11/2018, 12:39 [place] Message  Patient Details  Name: Robert Gregory MRN: [id number] Date of Birth: [birth year]   Medicare Important Message Given:  Yes    [name] 12/11/2018, 12:39 PM

## 2018-12-11 NOTE — Progress Notes (Signed)
PROGRESS NOTE    Robert Gregory  XBM:[id number] DOB: August 23, 1941 DOA: 12/03/2018 PCP: Seward Carol, MD   Brief Narrative:  78 year old with past medical history relevant for coronary artery disease status post CABG, status post pacemaker placement, atrial fibrillation on rivaroxaban, stage III CKD, obstructive sleep apnea, type 2 diabetes, [name] cirrhosis, recent diagnosis of metastatic hepatocellular carcinoma with disease to thoracic spine, hypertension admitted with near syncope and found to be in complete heart block status post pacemaker adjustment.  His hospital course has been complicated by bilateral lower extremity cellulitis, AKI on CKD complicated by hyperkalemia, dysphasia found to have esophageal dysmotility, recalcitrant back pain due to T7 pathologic fracture transferred to [facility] on 12/10/2018 for initiation of radiation therapy.   Assessment & Plan:   Principal Problem:   Near syncope Active Problems:   Essential hypertension   Hyperlipidemia   Type 2 diabetes mellitus with stage 3 chronic kidney disease (HCC)   Chronic back pain   Obesity, Class III, BMI 40-49.9 (morbid obesity) (HCC)   Cellulitis of right lower extremity   Complete heart block (HCC)   Acute renal failure superimposed on stage 3 chronic kidney disease (HCC)   Hepatocellular carcinoma (HCC)   #) Metastatic hepatocellular carcinoma/recalcitrant T7 pain due to pathologic fracture: Patient continues to report that his pain is somewhat better controlled.  He is quite sensitive to pain medication becomes altered quite quickly. -Continue oral oxycodone 2.5 to 5 mg as needed -Continue fentanyl patch -Continue lidocaine patch -Continue gabapentin 300 mg 3 times daily -Oncology following, [name] -Oncology started steroids as well as given 1 dose of zoledronic acid -Radiation oncology following, plan for treatment on 12/11/2018 -Neurosurgery did not feel that there was any surgical options for the  patient due to high risk of decompensation  #) Bilateral lower extremity cellulitis: This appears to have largely resolved. -Continue p.o. cephalexin  #) Dysphagia: -Swallow study showed evidence of nonspecific esophageal dysmotility -Gastroenterology has signed off  #) Complete heart block status post pacemaker/near syncope: Patient initially presented with near syncope and was found to be in complete heart block.  Patient did have pacemaker changed from VVI to DDD -Continue telemetry -Cardiology signed off, appreciate recommendations  #) AKI on CKD stage III/hyperkalemia: This is resolved with IV fluids. -Avoid nephrotoxins  #) Type 2 diabetes complicated by neuropathy: Patient's blood sugars are improved on sliding scale insulin.  He is on ultra concentrated U500 regular at home. -Continue sliding scale insulin -Carb restricted diet -We will watch blood sugars closely with initiation of steroids  #) Coronary artery disease status post CABG/hypertension/hyperlipidemia: -Continue carvedilol 6.25 mg twice daily  #) Paroxysmal atrial fibrillation: -Continue rivaroxaban  #) OSA: -Continue CPAP nightly  Fluids: Tolerating p.o. Electrolytes: Monitor and supplement Nutrition: Carb restricted diet  Disposition: Pending evaluation by [facility] inpatient rehab  DO NOT RESUSCITATE     Consultants:   Neurosurgery  Cardiology  Oncology, [name]  Radiation oncology  Gastroenterology, Sadie Haber  Physical medicine rehabilitation  Procedures:   Pacemaker transition from VVI to DDD pacing on 12/03/2018  Esophagram 12/05/2018 nonspecific esophageal motility disorder  Antimicrobials:   P.o. cephalexin started 12/08/2018  IV cefazolin 12/03/2020 to 12/08/2018  IV vancomycin and Zosyn 12/03/2018 to 12/04/2018   Subjective: This morning the patient reports that his pain is still there.  He cannot tell if his fentanyl patch is improved much.  He reports that he does not want to  try to increase the dose as he is concerned about  being zonked.  He is willing to try lower dose of the oxycodone.  He denies any nausea, vomiting, diarrhea, cough, congestion, rhinorrhea.  Objective: Vitals:   12/10/18 1323 12/10/18 2110 12/10/18 2124 12/11/18 0448  BP: 123/67  (!) 153/77 123/70  Pulse: 78  78 79  Resp: 14 18 (!) 22 16  Temp: 97.8 F (36.6 C)  97.9 F (36.6 C) 98.6 F (37 C)  TempSrc: Oral  Oral Oral  SpO2: 94% 95% 93% 95%  Weight:      Height:        Intake/Output Summary (Last 24 hours) at 12/11/2018 0954 Last data filed at 12/10/2018 1600 Gross per 24 hour  Intake -  Output 1 ml  Net -1 ml   Filed Weights   12/07/18 0613 12/08/18 0509 12/09/18 0600  Weight: (!) 144.6 kg (!) 143.2 kg (!) 146.3 kg    Examination:  General exam: Appears calm and comfortable  Respiratory system: No increased work of breathing, anterior lung sounds clear, no wheezes, crackles, rhonchi Cardiovascular system: Regular rate and rhythm, no murmurs Gastrointestinal system: Soft, nondistended, no rebound or guarding, plus bowel sounds. Central nervous system: Alert and oriented.  Bilateral lower extremity strength is 5 out of 5, upper cavity strength is 5 out of 5 Extremities: 1+ lower extremity edema Skin: No rashes over visible skin Psychiatry: Judgement and insight appear normal. Mood & affect appropriate.     Data Reviewed: I have personally reviewed following labs and imaging studies  CBC: Recent Labs  Lab 12/05/18 0335  12/08/18 0405 12/09/18 0238 12/09/18 1951 12/10/18 0530 12/11/18 0459  WBC 4.7   < > 5.3 5.1 5.3 4.8 5.6  NEUTROABS 3.2  --   --   --   --   --   --   HGB 9.8*   < > 9.9* 9.8* 10.3* 9.9* 10.2*  HCT 30.7*   < > 31.7* 30.4* 34.7* 32.7* 34.2*  MCV 92.5   < > 95.5 94.7 101.8* 101.6* 101.8*  PLT 66*   < > 71* 87* 67* 69* 70*   < > = values in this interval not displayed.   Basic Metabolic Panel: Recent Labs  Lab 12/06/18 1255 12/06/18 1901  12/07/18 0404 12/09/18 0238 12/10/18 0530  NA 134* 135 134* 135 138  K 5.6* 5.7* 4.8 5.5* 5.0  CL 108 108 106 108 110  CO2 16* 20* 19* 19* 20*  GLUCOSE 246* 222* 226* 251* 210*  BUN 36* 35* 33* 31* 31*  CREATININE 1.37* 1.30* 1.20 1.04 0.98  CALCIUM 8.9 8.8* 9.0 9.3 9.4   GFR: Estimated Creatinine Clearance: 95.1 mL/min (by C-G formula based on SCr of 0.98 mg/dL). Liver Function Tests: Recent Labs  Lab 12/05/18 0335 12/06/18 0310  AST 87* 55*  ALT 96* 65*  ALKPHOS 125 110  BILITOT 1.2 1.5*  PROT 6.1* 6.5  ALBUMIN 3.4* 4.0   No results for input(s): LIPASE, AMYLASE in the last 168 hours. No results for input(s): AMMONIA in the last 168 hours. Coagulation Profile: No results for input(s): INR, PROTIME in the last 168 hours. Cardiac Enzymes: Recent Labs  Lab 12/04/18 1103  TROPONINI 0.05*   BNP (last 3 results) No results for input(s): PROBNP in the last 8760 hours. HbA1C: No results for input(s): HGBA1C in the last 72 hours. CBG: Recent Labs  Lab 12/10/18 0816 12/10/18 1251 12/10/18 1617 12/10/18 2120 12/11/18 0741  GLUCAP 192* 262* 248* 259* 200*   Lipid Profile: No results for input(s): CHOL,  HDL, LDLCALC, TRIG, CHOLHDL, LDLDIRECT in the last 72 hours. Thyroid Function Tests: No results for input(s): TSH, T4TOTAL, FREET4, T3FREE, THYROIDAB in the last 72 hours. Anemia Panel: No results for input(s): VITAMINB12, FOLATE, FERRITIN, TIBC, IRON, RETICCTPCT in the last 72 hours. Sepsis Labs: No results for input(s): PROCALCITON, LATICACIDVEN in the last 168 hours.  No results found for this or any previous visit (from the past 240 hour(s)).       Radiology Studies: No results found.      Scheduled Meds: . calcitonin (salmon)  1 spray Alternating Nares Daily  . carvedilol  6.25 mg Oral BID WC  . cephALEXin  500 mg Oral Q8H  . dexamethasone  8 mg Oral Daily  . feeding supplement (GLUCERNA SHAKE)  237 mL Oral TID BM  . fentaNYL  1 patch  Transdermal Q72H  . gabapentin  300 mg Oral TID  . insulin aspart  0-9 Units Subcutaneous TID WC  . lidocaine  1 patch Transdermal Q24H  . nystatin  5 mL Oral 6 X Daily  . polyethylene glycol  17 g Oral Daily  . rivaroxaban  20 mg Oral Q supper  . senna-docusate  2 tablet Oral BID  . sodium bicarbonate/sodium chloride   Mouth Rinse QID  . sodium chloride flush  3 mL Intravenous Q12H   Continuous Infusions:   LOS: 8 days    Time spent: [place], MD [facility] If 7PM-7AM, please contact night-coverage [email or web address] Password [facility] 12/11/2018, 9:54 AM

## 2018-12-11 NOTE — Progress Notes (Signed)
[name] is now over at [facility].  He will start radiation therapy.  He will have radiation to his thoracic spine.  Unfortunately, interventional radiology did not think that they could do vertebroplasty.  He is not a candidate for spinal fusion.  He will start radiation therapy today.  I will start him on some steroids.  I think this is mandatory given that he has radiation to the spinal cord.  I am also going to give him a dose of Zometa.  I think he will need this also.  Because of the diabetes, I will also make sure that he is on nystatin mouth rinse.  I am sure his blood sugars will go up.  The spinal brace did not help him.  I think his body habitus was such that it was tough for the brace to fit properly.  His labs today show a white cell count of 5.6.  Hemoglobin 10.2.  Platelet count 70,000.  He is still on a cardiac monitor.  His vital signs show temperature 98.6.  Pulse 79.  Blood pressure 123/70.  Head neck exam shows no thrush.  He has no adenopathy in the neck.  There is no scleral icterus.  Lungs are clear.  Cardiac exam regular rate and rhythm.  There may be an extra beat.  Abdomen is morbidly obese.  Bowel sounds are present.  Extremities shows the chronic mild pitting edema in his lower legs.  Neurological exam is nonfocal.  Unfortunately, his wife is not allowed to stay with him any longer.  She has been forced to leave because of the coronavirus policies.  This is unfortunate but I understand.  I know that she has been a big help with him.  She is a retired [employer] scientist.  She definitely has been able to be on top of his blood sugar changes.  He has drops in his blood sugar in the afternoon.  This will have to be watched closely.  I know that he is getting great care from all the staff on [address].  I appreciate their help.  Lattie Haw, MD  Hebrews 13:16

## 2018-12-12 ENCOUNTER — Ambulatory Visit
Admit: 2018-12-12 | Discharge: 2018-12-12 | Disposition: A | Discharge status: Home / Self Care | Payer: Medicare Other | Attending: Radiation Oncology | Admitting: Radiation Oncology

## 2018-12-12 ENCOUNTER — Ambulatory Visit: Payer: Medicare Other

## 2018-12-12 LAB — GLUCOSE, CAPILLARY
GLUCOSE-CAPILLARY: 282 mg/dL — AB (ref 70–99)
GLUCOSE-CAPILLARY: 298 mg/dL — AB (ref 70–99)
Glucose-Capillary: 271 mg/dL — ABNORMAL HIGH (ref 70–99)
Glucose-Capillary: 284 mg/dL — ABNORMAL HIGH (ref 70–99)
Glucose-Capillary: 302 mg/dL — ABNORMAL HIGH (ref 70–99)
Glucose-Capillary: 334 mg/dL — ABNORMAL HIGH (ref 70–99)
Glucose-Capillary: 337 mg/dL — ABNORMAL HIGH (ref 70–99)

## 2018-12-12 LAB — CBC
HCT: 31.7 % — ABNORMAL LOW (ref 39.0–52.0)
Hemoglobin: 9.7 g/dL — ABNORMAL LOW (ref 13.0–17.0)
MCH: 30.6 pg (ref 26.0–34.0)
MCHC: 30.6 g/dL (ref 30.0–36.0)
MCV: 100 fL (ref 80.0–100.0)
PLATELETS: 71 10*3/uL — AB (ref 150–400)
RBC: 3.17 MIL/uL — ABNORMAL LOW (ref 4.22–5.81)
RDW: 19 % — ABNORMAL HIGH (ref 11.5–15.5)
WBC: 6.4 10*3/uL (ref 4.0–10.5)
nRBC: 0 % (ref 0.0–0.2)

## 2018-12-12 MED ORDER — MAGNESIUM CITRATE PO SOLN
1.0000 | Freq: Every day | ORAL | Status: DC | PRN
  Administered 2018-12-12: 1 via ORAL
  Filled 2018-12-12: qty 296

## 2018-12-12 MED ORDER — PRO-STAT SUGAR FREE PO LIQD
30.0000 mL | Freq: Two times a day (BID) | ORAL | Status: DC
  Administered 2018-12-12 – 2018-12-19 (×15): 30 mL via ORAL
  Filled 2018-12-12 (×15): qty 30

## 2018-12-12 MED ORDER — INSULIN ASPART 100 UNIT/ML ~~LOC~~ SOLN
10.0000 [IU] | Freq: Three times a day (TID) | SUBCUTANEOUS | Status: DC
  Administered 2018-12-12 (×3): 10 [IU] via SUBCUTANEOUS

## 2018-12-12 MED ORDER — INSULIN GLARGINE 100 UNIT/ML ~~LOC~~ SOLN
20.0000 [IU] | Freq: Two times a day (BID) | SUBCUTANEOUS | Status: DC
  Administered 2018-12-12 (×2): 20 [IU] via SUBCUTANEOUS
  Filled 2018-12-12 (×4): qty 0.2

## 2018-12-12 MED ORDER — ADULT MULTIVITAMIN W/MINERALS CH
1.0000 | ORAL_TABLET | Freq: Every day | ORAL | Status: DC
  Administered 2018-12-12 – 2018-12-28 (×16): 1 via ORAL
  Filled 2018-12-12 (×16): qty 1

## 2018-12-12 MED ORDER — POLYETHYLENE GLYCOL 3350 17 G PO PACK
17.0000 g | PACK | Freq: Two times a day (BID) | ORAL | Status: DC
  Administered 2018-12-12 – 2018-12-22 (×12): 17 g via ORAL
  Filled 2018-12-12 (×17): qty 1

## 2018-12-12 MED ORDER — FENTANYL 25 MCG/HR TD PT72
1.0000 | MEDICATED_PATCH | TRANSDERMAL | Status: DC
  Administered 2018-12-12 – 2018-12-18 (×3): 1 via TRANSDERMAL
  Filled 2018-12-12 (×3): qty 1

## 2018-12-12 NOTE — Progress Notes (Signed)
Cleansed and changed patient leg cellulitis, using saline, Xeroform, ABD pads, Kerlix, and ACE bandage.

## 2018-12-12 NOTE — Progress Notes (Signed)
PROGRESS NOTE    Robert Gregory  ZGY:[id number] DOB: [birth year] DOA: 12/03/2018 PCP: Seward Carol, MD   Brief Narrative:  78 year old with past medical history relevant for coronary artery disease status post CABG, status post pacemaker placement, atrial fibrillation on rivaroxaban, stage III CKD, obstructive sleep apnea, type 2 diabetes, [name] cirrhosis, recent diagnosis of metastatic hepatocellular carcinoma with disease to thoracic spine, hypertension admitted with near syncope and found to be in complete heart block status post pacemaker adjustment.  His hospital course has been complicated by bilateral lower extremity cellulitis, AKI on CKD complicated by hyperkalemia, dysphasia found to have esophageal dysmotility, recalcitrant back pain due to T7 pathologic fracture transferred to [facility] on 12/10/2018 for initiation of radiation therapy.   Assessment & Plan:   Principal Problem:   Near syncope Active Problems:   Essential hypertension   Hyperlipidemia   Type 2 diabetes mellitus with stage 3 chronic kidney disease (HCC)   Chronic back pain   Obesity, Class III, BMI 40-49.9 (morbid obesity) (HCC)   Cellulitis of right lower extremity   Complete heart block (HCC)   Acute renal failure superimposed on stage 3 chronic kidney disease (HCC)   Hepatocellular carcinoma (HCC)   #) Metastatic hepatocellular carcinoma/recalcitrant T7 pain due to pathologic fracture: Patient continues to report that his pain is somewhat better controlled.  He is quite sensitive to pain medication becomes altered quite quickly. -Continue oral oxycodone 2.5 to 5 mg as needed -Increase fentanyl patch 25 mcg every 72 hours -Continue lidocaine patch -Continue gabapentin 300 mg 3 times daily -Oncology following, [name] -Oncology started steroids as well as given 1 dose of zoledronic acid -Radiation oncology following, plan for treatment on 12/11/2018 -Neurosurgery did not feel that there was any  surgical options for the patient due to high risk of decompensation  #) Type 2 diabetes complicated by neuropathy: Blood sugars have increased on steroids -We will start glargine 20 units twice daily -We will start aspart 10 units with meals 3 times daily -Sliding scale insulin, AC at bedtime -Carb restricted diet -We will watch blood sugars closely with initiation of steroids   #) Bilateral lower extremity cellulitis: This appears to have largely resolved. -Discontinue p.o. cephalexin  #) Dysphagia: -Swallow study showed evidence of nonspecific esophageal dysmotility -Gastroenterology has signed off  #) Complete heart block status post pacemaker/near syncope: Patient initially presented with near syncope and was found to be in complete heart block.  Patient did have pacemaker changed from VVI to DDD -Continue telemetry -Cardiology signed off, appreciate recommendations  #) AKI on CKD stage III/hyperkalemia: This is resolved with IV fluids. -Avoid nephrotoxins  #) Coronary artery disease status post CABG/hypertension/hyperlipidemia: -Continue carvedilol 6.25 mg twice daily  #) Paroxysmal atrial fibrillation: -Continue rivaroxaban  #) OSA: -Continue CPAP nightly  Fluids: Tolerating p.o. Electrolytes: Monitor and supplement Nutrition: Carb restricted diet  Disposition: Pending evaluation by [facility] inpatient rehab  DO NOT RESUSCITATE     Consultants:   Neurosurgery  Cardiology  Oncology, [name]  Radiation oncology  Gastroenterology, Sadie Haber  Physical medicine rehabilitation  Procedures:   Pacemaker transition from VVI to DDD pacing on 12/03/2018  Esophagram 12/05/2018 nonspecific esophageal motility disorder  Antimicrobials:   P.o. cephalexin started 12/08/2018 to 12/12/2018  IV cefazolin 12/03/2020 to 12/08/2018  IV vancomycin and Zosyn 12/03/2018 to 12/04/2018   Subjective: This morning the patient reports that his pain does not improve much on the  fentanyl patch.  He is willing to increase the dose.  He reports his radiation treatment went well.  He denies any nausea, vomiting, diarrhea, cough, congestion, rhinorrhea.  Objective: Vitals:   12/11/18 1322 12/11/18 2010 12/11/18 2200 12/12/18 0430  BP: 120/65  (!) 99/50 (!) 107/53  Pulse: 86  83 70  Resp: (!) 21 (!) 22 18 18   Temp: 98.2 F (36.8 C)  98.1 F (36.7 C) 97.8 F (36.6 C)  TempSrc: Oral  Oral Oral  SpO2: 93%  100% 96%  Weight:      Height:        Intake/Output Summary (Last 24 hours) at 12/12/2018 1016 Last data filed at 12/12/2018 0600 Gross per 24 hour  Intake 1293 ml  Output 1675 ml  Net -382 ml   Filed Weights   12/07/18 0613 12/08/18 0509 12/09/18 0600  Weight: (!) 144.6 kg (!) 143.2 kg (!) 146.3 kg    Examination:  General exam: Appears calm and comfortable  Respiratory system: No increased work of breathing, anterior lung sounds clear, no wheezes, crackles, rhonchi Cardiovascular system: Regular rate and rhythm, no murmurs Gastrointestinal system: Soft, nondistended, no rebound or guarding, plus bowel sounds. Central nervous system: Alert and oriented.  Bilateral lower extremity strength is 5 out of 5, upper cavity strength is 5 out of 5 Extremities: 1+ lower extremity edema Skin: No rashes over visible skin Psychiatry: Judgement and insight appear normal. Mood & affect appropriate.     Data Reviewed: I have personally reviewed following labs and imaging studies  CBC: Recent Labs  Lab 12/09/18 0238 12/09/18 1951 12/10/18 0530 12/11/18 0459 12/12/18 0427  WBC 5.1 5.3 4.8 5.6 6.4  HGB 9.8* 10.3* 9.9* 10.2* 9.7*  HCT 30.4* 34.7* 32.7* 34.2* 31.7*  MCV 94.7 101.8* 101.6* 101.8* 100.0  PLT 87* 67* 69* 70* 71*   Basic Metabolic Panel: Recent Labs  Lab 12/06/18 1255 12/06/18 1901 12/07/18 0404 12/09/18 0238 12/10/18 0530  NA 134* 135 134* 135 138  K 5.6* 5.7* 4.8 5.5* 5.0  CL 108 108 106 108 110  CO2 16* 20* 19* 19* 20*  GLUCOSE 246*  222* 226* 251* 210*  BUN 36* 35* 33* 31* 31*  CREATININE 1.37* 1.30* 1.20 1.04 0.98  CALCIUM 8.9 8.8* 9.0 9.3 9.4   GFR: Estimated Creatinine Clearance: 95.1 mL/min (by C-G formula based on SCr of 0.98 mg/dL). Liver Function Tests: Recent Labs  Lab 12/06/18 0310  AST 55*  ALT 65*  ALKPHOS 110  BILITOT 1.5*  PROT 6.5  ALBUMIN 4.0   No results for input(s): LIPASE, AMYLASE in the last 168 hours. No results for input(s): AMMONIA in the last 168 hours. Coagulation Profile: No results for input(s): INR, PROTIME in the last 168 hours. Cardiac Enzymes: No results for input(s): CKTOTAL, CKMB, CKMBINDEX, TROPONINI in the last 168 hours. BNP (last 3 results) No results for input(s): PROBNP in the last 8760 hours. HbA1C: No results for input(s): HGBA1C in the last 72 hours. CBG: Recent Labs  Lab 12/11/18 2012 12/12/18 0029 12/12/18 0426 12/12/18 0737 12/12/18 0949  GLUCAP 346* 334* 302* 282* 298*   Lipid Profile: No results for input(s): CHOL, HDL, LDLCALC, TRIG, CHOLHDL, LDLDIRECT in the last 72 hours. Thyroid Function Tests: No results for input(s): TSH, T4TOTAL, FREET4, T3FREE, THYROIDAB in the last 72 hours. Anemia Panel: No results for input(s): VITAMINB12, FOLATE, FERRITIN, TIBC, IRON, RETICCTPCT in the last 72 hours. Sepsis Labs: No results for input(s): PROCALCITON, LATICACIDVEN in the last 168 hours.  No results found for this or any previous visit (from the  past 240 hour(s)).       Radiology Studies: No results found.      Scheduled Meds: . calcitonin (salmon)  1 spray Alternating Nares Daily  . carvedilol  6.25 mg Oral BID WC  . cephALEXin  500 mg Oral Q8H  . dexamethasone  8 mg Oral Daily  . feeding supplement (GLUCERNA SHAKE)  237 mL Oral TID BM  . fentaNYL  1 patch Transdermal Q72H  . gabapentin  300 mg Oral TID  . insulin aspart  0-9 Units Subcutaneous TID WC  . insulin aspart  10 Units Subcutaneous TID WC  . insulin glargine  20 Units  Subcutaneous BID  . lidocaine  1 patch Transdermal Q24H  . nystatin  5 mL Oral 6 X Daily  . polyethylene glycol  17 g Oral Daily  . rivaroxaban  20 mg Oral Q supper  . senna-docusate  2 tablet Oral BID  . sodium bicarbonate/sodium chloride   Mouth Rinse QID  . sodium chloride flush  3 mL Intravenous Q12H   Continuous Infusions:   LOS: 9 days    Time spent: [place], MD [facility] If 7PM-7AM, please contact night-coverage [email or web address] Password [facility] 12/12/2018, 10:16 AM

## 2018-12-12 NOTE — Progress Notes (Signed)
Mr. Mudrick started his radiation therapy yesterday.  He did well with this.  He also received Zometa.  He is on Decadron now.  His blood sugars are quite high.  Does not look like there will was a drop in his blood sugars yesterday.  There is been no issues with his pain regimen.  He is on a fentanyl patch.  He also has on a Lidoderm patch.  He is continuing Keflex for the cellulitis in the right lower leg.  He still has a hard time with maneuvering.  I am sure this is from the back.  I know that physical therapy is trying to help him.  Unfortunately, a back brace cannot be applied because of his body habitus.  His labs today show white cell count 6.4.  Hemoglobin 9.7.  Platelet count 71,000.  There really is no change in his overall physical exam.  His heart monitor shows a paced rhythm.  I think he can probably come off the Holter monitor now.  His vital signs are temperature 97.8.  Pulse 70.  Blood pressure 107/53.  His oral exam shows no thrush.  His lungs are clear bilaterally.  Cardiac exam regular rate and rhythm with no murmurs, rubs or bruits.  Abdomen is morbidly obese.  His abdomen is soft.  He has no fluid wave.  There is no obvious abdominal mass.  Extremities shows a dressing on the right lower leg.  He has some chronic mild nonpitting edema in his lower legs.  Neurological exam shows no neurological deficits.  Mr. Hammack will continue his radiation therapy for the pathologic fracture in the thoracic spine.  He has metastatic hepatocellular carcinoma.  Lattie Haw, MD  Philippians 4:13

## 2018-12-12 NOTE — Progress Notes (Signed)
Initial Nutrition Assessment  RD working remotely.   DOCUMENTATION CODES:   Morbid obesity  INTERVENTION:  - Continue Glucerna Shake TID, each supplement provides 220 kcal and 10 grams of protein. - Will order 30 mL Prostat BID, each supplement provides 100 kcal and 15 grams of protein. - Will order daily multivitamin with minerals. - Continue to encourage PO intakes.    NUTRITION DIAGNOSIS:   Inadequate oral intake related to acute illness, decreased appetite as evidenced by meal completion < 50%.  GOAL:   Patient will meet greater than or equal to 90% of their needs  MONITOR:   PO intake, Supplement acceptance, Weight trends, Labs  REASON FOR ASSESSMENT:   LOS(day #9)  ASSESSMENT:   78 year old with past medical history relevant for CAD s/p CABG, s/p pacemaker placement, atrial fibrillation, stage 3 CKD, OSA, type 2 DM, NASH cirrhosis, recent diagnosis of metastatic hepatocellular carcinoma with disease to thoracic spine, and HTN. He was admitted with near syncope and found to be in complete heart block s/p pacemaker adjustment.  His hospital course has been complicated by bilateral lower extremity cellulitis, AKI on CKD complicated by hyperkalemia, dysphasia found to have esophageal dysmotility, recalcitrant back pain due to T7 pathologic fracture transferred to [facility] on 12/10/2018 for initiation of radiation therapy.  Per review of RN flow sheet, patient has been eating mainly 25-50% at meal times since admission. Per chart review, most recent weight (3/21) was 322 lb which is the highest weight since admission (3/15). Glucerna Shake was ordered TID on 3/22 and patient has accepted 4 of 5 bottles offered.   Per Dr. Harvel Quale note this AM: metastatic hepatocellular carcinoma with pain d/t pathologic fx, patient sensitive to pain medication and becomes altered quickly, Neurosurgery did not feel that any surgical options were available to patient d/t high risk for  decompensation, type 2 DM with neuropathy, bilateral lower extremity cellulitis--mainly resolved, dysphagia with swallow study showing evidence of esophageal dysmotility--GI has signed off.  Today patient had radiation treatment #2 of a planned 10. No recent SLP notes able to be found. Will continue to monitor PO intakes and if tube feeding would be needed given esophageal dysmotility, possible ongoing poor/inadequate intakes.    Medications reviewed; sliding scale novolog, 10 units novolog TID, 20 units lantus BID, 5 ml mycostatin x6/day, 1 packet miralax/day, 2 tablets senokot BID. Labs reviewed; CBGs: 282-334 mg/dl since midnight, no BMP.     NUTRITION - FOCUSED PHYSICAL EXAM:  Unable to complete at this time.   Diet Order:   Diet Order            Diet heart healthy/carb modified Room service appropriate? Yes; Fluid consistency: Thin  Diet effective now              EDUCATION NEEDS:   Not appropriate for education at this time  Skin:  Skin Assessment: Reviewed RN Assessment  Last BM:  3/23  Height:   Ht Readings from Last 1 Encounters:  12/03/18 6' 1"  (1.854 m)    Weight:   Wt Readings from Last 1 Encounters:  12/09/18 (!) 146.3 kg    Ideal Body Weight:  83.64 kg  BMI:  Body mass index is 42.55 kg/m.  Estimated Nutritional Needs:   Kcal:  2510-2760 kcal  Protein:  125-140 grams  Fluid:  >/= 2.3 L/day     Jarome Matin, MS, RD, LDN, [facility] Inpatient Clinical Dietitian Pager # [phone] After hours/weekend pager # [phone]

## 2018-12-12 NOTE — TOC Initial Note (Signed)
Transition of Care [facility]) - Initial/Assessment Note    Patient Details  Name: Robert Gregory MRN: [id number] Date of Birth: 26-Jul-1941  Transition of Care [facility]) CM/SW Contact:    Dessa Phi, RN Phone Number: 12/12/2018, 2:27 PM  Clinical Narrative:patient defers to spouse-TC Edna-best tel#s to reach her h [phone];then c#[phone]. Edna's d/c plan is for patient to complete radiation in hospital since he is unable to tolerate getting in & out of w/c for otpt radiation. She prefers after radiation to consider if he is appropriate for CIR, she is also considering hospice at some point, they adamantly decline SNF. She is willing to receive info on [facility] services-will mail brochure to patient's home. Noted per nsg-pain control issues-receiving fentanyl patch, iv morphine. Spoke to CIR rep Kelly-informed her of patient/spouse plans-she will continue to follow to see if after xrt completed if appropriate for CIR. Continue to monitor progress.                   Expected Discharge Plan: Home/Self Care Barriers to Discharge: No Barriers Identified   Patient Goals and CMS Choice   CMS Medicare.gov Compare Post Acute Care list provided to:: Other (Comment Required)(Spouse provided Hospice List) Choice offered to / list presented to : Spouse  Expected Discharge Plan and Services Expected Discharge Plan: Home/Self Care   Discharge Planning Services: CM Consult   Living arrangements for the past 2 months: Single Family Home                          Prior Living Arrangements/Services Living arrangements for the past 2 months: Single Family Home Lives with:: Spouse Patient language and need for interpreter reviewed:: Yes Do you feel safe going back to the place where you live?: Yes      Need for Family Participation in Patient Care: No (Comment) Care giver support system in place?: Yes (comment) Current home services: DME(rw) Criminal Activity/Legal Involvement Pertinent to Current  Situation/Hospitalization: No - Comment as needed  Activities of Daily Living Home Assistive Devices/Equipment: None ADL Screening (condition at time of admission) Patient's cognitive ability adequate to safely complete daily activities?: Yes Is the patient deaf or have difficulty hearing?: No Does the patient have difficulty seeing, even when wearing glasses/contacts?: No Does the patient have difficulty concentrating, remembering, or making decisions?: No Patient able to express need for assistance with ADLs?: Yes Does the patient have difficulty dressing or bathing?: Yes Independently performs ADLs?: No Communication: Independent Dressing (OT): Needs assistance Is this a change from baseline?: Pre-admission baseline Grooming: Needs assistance Is this a change from baseline?: Pre-admission baseline Feeding: Independent Bathing: Independent Toileting: Needs assistance Is this a change from baseline?: Pre-admission baseline In/Out Bed: Needs assistance Is this a change from baseline?: Pre-admission baseline Walks in Home: Needs assistance Is this a change from baseline?: Pre-admission baseline Does the patient have difficulty walking or climbing stairs?: Yes Weakness of Legs: Both Weakness of Arms/Hands: Both  Permission Sought/Granted Permission sought to share information with : Case Manager Permission granted to share information with : Yes, Verbal Permission Granted  Share Information with NAME: Lynelle Smoke)     Permission granted to share info w Relationship: (spouse)  Permission granted to share info w Contact Information: [phone])  Emotional Assessment Appearance:: Appears stated age Attitude/Demeanor/Rapport: Engaged Affect (typically observed): Accepting Orientation: : Oriented to Self, Oriented to Place, Oriented to  Time, Oriented to Situation Alcohol / Substance  Use: Alcohol Use Psych Involvement: No (comment)  Admission diagnosis:  Complete heart block  (HCC) [I44.2] Hyperkalemia [E87.5] Acute kidney injury [facility]) [N17.9] Patient Active Problem List   Diagnosis Date Noted  . Osseous metastasis ([place]) 12/11/2018  . Near syncope 12/03/2018  . Complete heart block ([place]) 12/03/2018  . Acute renal failure superimposed on stage 3 chronic kidney disease ([place]) 12/03/2018  . Hepatocellular carcinoma ([place]) 12/03/2018  . Cellulitis of right lower extremity   . Sepsis ([place]) 09/07/2018  . Iron deficiency anemia due to chronic blood loss 08/24/2018  . Iron malabsorption 08/24/2018  . Pernicious anemia 08/24/2018  . Obesity, Class III, BMI 40-49.9 (morbid obesity) ([place]) 05/06/2017  . Splenomegaly, not elsewhere classified 10/27/2016  . Renal mass, left 10/27/2016  . Abnormal nuclear stress test   . Obstructive sleep apnea   . Nephrolithiasis   . Thrombocytopenia ([place])   . Cholelithiasis   . ICD (implantable cardioverter-defibrillator) battery depletion   . Ischemic cardiomyopathy   . Myocardial infarction ([place])   . Pneumonia   . Peripheral neuropathy   . Joint pain   . Chronic back pain   . History of colon polyps   . Urinary frequency   . History of kidney stones   . Type 2 diabetes mellitus with stage 3 chronic kidney disease ([place]) 05/19/2015  . H/O total knee replacement, right 11/29/2014  . Pre-operative cardiovascular examination 11/20/2014  . CAD (coronary artery disease) 11/07/2013  . Atrial fibrillation ([place]) 11/07/2013  . ICD (implantable cardioverter-defibrillator) in place 11/07/2013  . Essential hypertension 11/07/2013  . Hyperlipidemia 11/07/2013  . Cardiomyopathy, ischemic 11/07/2013   PCP:  Seward Carol, MD Pharmacy:   [facility], [place] [address] [place] [address] Phone: [phone] Fax: [phone]  [place], [place] [address] Phone: [phone] Fax:  [phone]     Social Determinants of Health (SDOH) Interventions    Readmission Risk Interventions No flowsheet data found.

## 2018-12-12 NOTE — Progress Notes (Signed)
   12/12/18 1834  What Happened  Was fall witnessed? Yes  Who witnessed fall? Nurse Tech, Ash  Patients activity before fall ambulating-assisted  Point of contact arm/shoulder;hip/leg  Was patient injured? No  Follow Up  MD notified Thomes Dinning  Time MD notified [phone]  Family notified No- patient refusal  Additional tests No  Simple treatment Other (comment) (patient refused)  Progress note created (see row info) Yes  Adult Fall Risk Assessment  Risk Factor Category (scoring not indicated) Fall has occurred during this admission (document High fall risk)  Patient Fall Risk Level High fall risk  Adult Fall Risk Interventions  Required Bundle Interventions *See Row Information* High fall risk - low, moderate, and high requirements implemented  Additional Interventions Use of appropriate toileting equipment (bedpan, BSC, etc.);PT/OT need assessed if change in mobility from baseline  Screening for Fall Injury Risk (To be completed on HIGH fall risk patients) - Assessing Need for Low Bed  Risk For Fall Injury- Low Bed Criteria Previous fall this admission  Will Implement Low Bed and Floor Mats Low bed contraindicated, floor mats in place  Specialty Low Bed Contraindicated Hemodynamically unstable  Vitals  Temp (!) 97.5 F (36.4 C)  Temp Source Oral  BP (!) 150/83  MAP (mmHg) 101  BP Location Right Arm  BP Method Automatic  Patient Position (if appropriate) Lying  Pulse Rate 88  Pulse Rate Source Dinamap  Resp 20  Oxygen Therapy  SpO2 90 %  O2 Device Room Air  Pain Assessment  Pain Scale 0-10  Pain Score 0  PCA/Epidural/Spinal Assessment  Respiratory Pattern Regular;Labored;Dyspnea with exertion  Neurological  Neuro (WDL) WDL  Level of Consciousness Alert  Orientation Level Oriented X4  Speech Clear;Appropriate for developmental age  Motor Function/Sensation Assessment Grip;Sensation  R Hand Grip Moderate  L Hand Grip Moderate   RUE Motor Response Purposeful movement   RUE Sensation Full sensation  LUE Motor Response Purposeful movement  LUE Sensation Full sensation  RLE Motor Response Purposeful movement  RLE Sensation Full sensation  LLE Motor Response Purposeful movement  LLE Sensation Full sensation  Neuro Symptoms None  Glasgow Coma Scale  Eye Opening 4  Best Verbal Response (NON-intubated) 5  Best Motor Response 6  Glasgow Coma Scale Score 15  Musculoskeletal  Musculoskeletal (WDL) X  Assistive Device BSC;Four wheel walker  Generalized Weakness Yes  Weight Bearing Restrictions No

## 2018-12-12 NOTE — Progress Notes (Signed)
[facility] Therapy Treatment Record Phone [phone]   Radiation Therapy was administered to Robert Gregory on: 12/12/2018  11:45 AM and was treatment # 2 out of a planned course of 10 treatments.  Radiation Treatment  1). Beam photons with 6-10 energy  2). Brachytherapy None  3). Stereotactic Radiosurgery None  4). Other Radiation None     Robert Gregory A Kadience Macchi, RT (T)

## 2018-12-12 NOTE — Consult Note (Signed)
   [facility] CM Inpatient Consult   12/12/2018  ISSIAC JAMAR 1940-12-30 [id number]    Referral received for mailing information to the patient regarding [facility] services.  This was deferred back to the [place] team.  Spoke with inpatient Transition of Care [facility] regarding needs.  She states the wife specifically wanted information mailed and no calls until she looks over the information regarding [employer] services.  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  Will forward request back to [place] office staff.  Thanks for the referral,  Natividad Brood, RN BSN [facility] Liaison  [phone] business mobile phone Toll free office [phone]

## 2018-12-12 NOTE — Progress Notes (Signed)
Physical Therapy Treatment Patient Details Name: Robert Gregory MRN: [id number] DOB: 1941/05/12 Today's Date: 12/12/2018    History of Present Illness Pt is a 78 y/o male admitted 12/03/18 with dizziness, bradycardia and near syncope. Pt with complete heart block and metastatic hepatocellular carinoma with T7 compression fx. Not a candidate for verterbroplasty, to begin palliative radiation at [facility] 3/20. PMH includes DM, MI, ICD, HTN, HLD, A-fib.    PT Comments    PT session limited to bed-level LE exercises and positioning due to pt-reported fatigue, dyspnea, and desaturations with activity. Pt able to tolerate performing limited LE exercises, but became noticeably dyspneic. Pt's sats dropped to 82% after second LE exercise, and prior to this pt was maintaining sats 88-90% on RA. PT placed pt on O2 to recover sats, RN notified and approved. Pt's sats at 95% on 2LO2 when PT left room, RN notified. PT to continue to follow acutely.    Follow Up Recommendations  CIR;Supervision for mobility/OOB     Equipment Recommendations  None recommended by PT    Recommendations for Other Services       Precautions / Restrictions Precautions Precautions: Fall;Back Precaution Comments: pt opting against use of brace (for comfort) - pt states MD stated this was okay. Reviewed back precautions with pt this session to protect spine (no bending, twisting, arching spine; no lifting). Watch sats with mobility Required Braces or Orthoses: Spinal Brace Spinal Brace: Thoracolumbosacral orthotic Restrictions Weight Bearing Restrictions: No    Mobility  Bed Mobility Overal bed mobility: Needs Assistance Bed Mobility: Rolling Rolling: Min assist         General bed mobility comments: Pt with rolling towards L to relieve pressure in back, PT assisting pt in coming into more sidelying with use of bed pad and positioning with pillow between pt's legs to prevent torque on pt's back.    Transfers Overall transfer level: (NT - pt with tachypnea, dyspnea and desats )                  Ambulation/Gait                 Stairs             Wheelchair Mobility    Modified Rankin (Stroke Patients Only)       Balance Overall balance assessment: Needs assistance(not assessed this session, pt limited to bed-level of activity due to respiratory status) Sitting-balance support: Feet supported;Bilateral upper extremity supported Sitting balance-Leahy Scale: Fair     Standing balance support: Bilateral upper extremity supported Standing balance-Leahy Scale: Poor Standing balance comment: relies on RW for support                             Cognition Arousal/Alertness: Awake/alert Behavior During Therapy: WFL for tasks assessed/performed Overall Cognitive Status: Within Functional Limits for tasks assessed                                        Exercises General Exercises - Lower Extremity Heel Slides: AROM;Both;10 reps;Supine Straight Leg Raises: AROM;Both;10 reps;Supine    General Comments General comments (skin integrity, edema, etc.): Pt with dyspnea 3/4 with exercise, and difficulty with speaking in fluid sentences without pausing to breathe. Pt with tachypnea >30 as well. PT was monitoring sats throughout bed-level activity, and sats dropped to 82% on RA  with LE exercise. PT applied [place] and placed pt on 2LO2 to recover sats, and pt instructed in breathing techniques (in through nose, out through mouth) to recover. Sats returned to 95% with 2LO2 applied, RN notified and approved.       Pertinent Vitals/Pain Pain Assessment: Faces Faces Pain Scale: Hurts even more Pain Location: back  Pain Descriptors / Indicators: Discomfort;Sore Pain Intervention(s): Limited activity within patient's tolerance;Monitored during session;Repositioned    Home Living                      Prior Function            PT  Goals (current goals can now be found in the care plan section) Acute Rehab PT Goals Patient Stated Goal: to get this pain to decrease PT Goal Formulation: With patient/family Time For Goal Achievement: 12/19/18 Potential to Achieve Goals: Good Progress towards PT goals: Progressing toward goals    Frequency    Min 3X/week      PT Plan Current plan remains appropriate    Co-evaluation              AM-PAC PT "6 Clicks" Mobility   Outcome Measure  Help needed turning from your back to your side while in a flat bed without using bedrails?: A Little Help needed moving from lying on your back to sitting on the side of a flat bed without using bedrails?: A Lot Help needed moving to and from a bed to a chair (including a wheelchair)?: A Little Help needed standing up from a chair using your arms (e.g., wheelchair or bedside chair)?: A Little Help needed to walk in hospital room?: A Little Help needed climbing 3-5 steps with a railing? : A Lot 6 Click Score: 16    End of Session   Activity Tolerance: Patient limited by fatigue;Treatment limited secondary to medical complications (Comment);Patient limited by pain Patient left: in bed;with bed alarm set;with call bell/phone within reach Nurse Communication: Mobility status;Other (comment)(desats, dyspnea) PT Visit Diagnosis: Pain;Muscle weakness (generalized) (M62.81);Unsteadiness on feet (R26.81) Pain - part of body: (back)     Time: 3887-1959 PT Time Calculation (min) (ACUTE ONLY): 17 min  Charges:  $Therapeutic Exercise: 8-22 mins                    Davante Gerke Conception Chancy, PT Acute Rehabilitation Services Pager [phone]  Office [phone]    Hilja Kintzel D Elonda Husky 12/12/2018, 5:18 PM

## 2018-12-13 ENCOUNTER — Ambulatory Visit: Payer: Medicare Other

## 2018-12-13 ENCOUNTER — Ambulatory Visit
Admit: 2018-12-13 | Discharge: 2018-12-13 | Disposition: A | Discharge status: Home / Self Care | Payer: Medicare Other | Attending: Radiation Oncology | Admitting: Radiation Oncology

## 2018-12-13 LAB — CBC
HCT: 34.2 % — ABNORMAL LOW (ref 39.0–52.0)
Hemoglobin: 10.6 g/dL — ABNORMAL LOW (ref 13.0–17.0)
MCH: 31.3 pg (ref 26.0–34.0)
MCHC: 31 g/dL (ref 30.0–36.0)
MCV: 100.9 fL — ABNORMAL HIGH (ref 80.0–100.0)
PLATELETS: 72 10*3/uL — AB (ref 150–400)
RBC: 3.39 MIL/uL — ABNORMAL LOW (ref 4.22–5.81)
RDW: 19 % — ABNORMAL HIGH (ref 11.5–15.5)
WBC: 7.2 10*3/uL (ref 4.0–10.5)
nRBC: 0 % (ref 0.0–0.2)

## 2018-12-13 LAB — GLUCOSE, CAPILLARY
GLUCOSE-CAPILLARY: 305 mg/dL — AB (ref 70–99)
Glucose-Capillary: 290 mg/dL — ABNORMAL HIGH (ref 70–99)
Glucose-Capillary: 268 mg/dL — ABNORMAL HIGH (ref 70–99)
Glucose-Capillary: 276 mg/dL — ABNORMAL HIGH (ref 70–99)
Glucose-Capillary: 274 mg/dL — ABNORMAL HIGH (ref 70–99)
Glucose-Capillary: 248 mg/dL — ABNORMAL HIGH (ref 70–99)

## 2018-12-13 LAB — BASIC METABOLIC PANEL
Anion gap: 9 (ref 5–15)
BUN: 36 mg/dL — ABNORMAL HIGH (ref 8–23)
CO2: 22 mmol/L (ref 22–32)
Calcium: 9.7 mg/dL (ref 8.9–10.3)
Chloride: 104 mmol/L (ref 98–111)
Creatinine, Ser: 0.98 mg/dL (ref 0.61–1.24)
GFR calc Af Amer: >60 mL/min (ref >60)
Glucose, Bld: 300 mg/dL — ABNORMAL HIGH (ref 70–99)
Sodium: 135 mmol/L (ref 135–145)

## 2018-12-13 LAB — BASIC METABOLIC PANEL WITH GFR
GFR calc non Af Amer: >60 mL/min (ref >60)
Potassium: 5.2 mmol/L — ABNORMAL HIGH (ref 3.5–5.1)

## 2018-12-13 MED ORDER — INSULIN ASPART 100 UNIT/ML ~~LOC~~ SOLN
20.0000 [IU] | Freq: Three times a day (TID) | SUBCUTANEOUS | Status: DC
  Administered 2018-12-13 – 2018-12-14 (×6): 20 [IU] via SUBCUTANEOUS

## 2018-12-13 MED ORDER — INSULIN GLARGINE 100 UNIT/ML ~~LOC~~ SOLN
30.0000 [IU] | Freq: Two times a day (BID) | SUBCUTANEOUS | Status: DC
  Administered 2018-12-13 – 2018-12-15 (×5): 30 [IU] via SUBCUTANEOUS
  Filled 2018-12-13 (×6): qty 0.3

## 2018-12-13 NOTE — Progress Notes (Signed)
OT Cancellation Note  Patient Details Name: Robert Gregory MRN: [id number] DOB: Nov 17, 1940   Cancelled Treatment:    Reason Eval/Treat Not Completed: Other (comment)   Pain limiting ability to participate;Patient at procedure or test/unavailable - pt with fall yesterday evening, reporting "pretty significant" pain in band around T7. Pt going for x-ray shortly to rule out injury from fall.  Lin Landsman Mickel Baas, OT Acute Rehabilitation Services [phone] [phone]    12/13/2018, 12:17 PM

## 2018-12-13 NOTE — Progress Notes (Signed)
PT Cancellation Note  Patient Details Name: Robert Gregory MRN: [id number] DOB: Jun 25, 1941   Cancelled Treatment:    Reason Eval/Treat Not Completed: Pain limiting ability to participate;Patient at procedure or test/unavailable - pt with fall yesterday evening, reporting "pretty significant" pain in band around T7. Pt going for x-ray shortly to rule out injury from fall. PT to check back as schedule allows.  [name], PT Acute Rehabilitation Services Pager [phone]  Office [phone]    Roxine Caddy D Elonda Husky 12/13/2018, 9:38 AM

## 2018-12-13 NOTE — Progress Notes (Signed)
PROGRESS NOTE    Robert Gregory  SEG:[id number] DOB: Mar 02, 1941 DOA: 12/03/2018 PCP: Seward Carol, MD   Brief Narrative:  78 year old with past medical history relevant for coronary artery disease status post CABG, status post pacemaker placement, atrial fibrillation on rivaroxaban, stage III CKD, obstructive sleep apnea, type 2 diabetes, Robert Gregory cirrhosis, recent diagnosis of metastatic hepatocellular carcinoma with disease to thoracic spine, hypertension admitted with near syncope and found to be in complete heart block status post pacemaker adjustment.  His hospital course has been complicated by bilateral lower extremity cellulitis, AKI on CKD complicated by hyperkalemia, dysphasia found to have esophageal dysmotility, recalcitrant back pain due to T7 pathologic fracture transferred to [facility] on 12/10/2018 for initiation of radiation therapy.   Assessment & Plan:   Principal Problem:   Near syncope Active Problems:   Essential hypertension   Hyperlipidemia   Type 2 diabetes mellitus with stage 3 chronic kidney disease (HCC)   Chronic back pain   Obesity, Class III, BMI 40-49.9 (morbid obesity) (HCC)   Cellulitis of right lower extremity   Complete heart block (HCC)   Acute renal failure superimposed on stage 3 chronic kidney disease (HCC)   Hepatocellular carcinoma (HCC)   #) Metastatic hepatocellular carcinoma/recalcitrant T7 pain due to pathologic fracture: Patient continues to report that his pain is somewhat better controlled.  He is quite sensitive to pain medication becomes altered quite quickly. -Continue oral oxycodone 2.5 to 5 mg as needed -Continue fentanyl patch 25 mcg every 72 hours -Continue lidocaine patch -Continue gabapentin 300 mg 3 times daily -Oncology following, Dr. [name] -Oncology started steroids as well as given 1 dose of zoledronic acid -Radiation oncology following, treatment started on 12/11/2018 -Neurosurgery did not feel that there was any surgical  options for the patient due to high risk of decompensation  #) Type 2 diabetes complicated by neuropathy: Blood sugars have increased on steroids -Increase glargine 25 units twice daily -Increase aspart 20 units with meals 3 times daily -Sliding scale insulin, AC at bedtime -Carb restricted diet -We will watch blood sugars closely with initiation of steroids   #) Bilateral lower extremity cellulitis: This appears to have largely resolved with a course of IV and p.o. antibiotics  #) Dysphagia: -Swallow study showed evidence of nonspecific esophageal dysmotility -Gastroenterology has signed off  #) Complete heart block status post pacemaker/near syncope: Patient initially presented with near syncope and was found to be in complete heart block.  Patient did have pacemaker changed from VVI to DDD -Continue telemetry -Cardiology signed off, appreciate recommendations  #) AKI on CKD stage III/hyperkalemia: This is resolved with IV fluids. -Avoid nephrotoxins  #) Coronary artery disease status post CABG/hypertension/hyperlipidemia: -Continue carvedilol 6.25 mg twice daily  #) Paroxysmal atrial fibrillation: -Continue rivaroxaban  #) OSA: -Continue CPAP nightly  Fluids: Tolerating p.o. Electrolytes: Monitor and supplement Nutrition: Carb restricted diet  Disposition: Pending evaluation by [facility] inpatient rehab  DO NOT RESUSCITATE     Consultants:   Neurosurgery  Cardiology  Oncology, Dr. [name]  Radiation oncology  Gastroenterology, Robert Gregory  Physical medicine rehabilitation  Procedures:   Pacemaker transition from VVI to DDD pacing on 12/03/2018  Esophagram 12/05/2018 nonspecific esophageal motility disorder  Antimicrobials:   P.o. cephalexin started 12/08/2018 to 12/12/2018  IV cefazolin 12/03/2020 to 12/08/2018  IV vancomycin and Zosyn 12/03/2018 to 12/04/2018   Subjective: This morning the patient reports he is doing well.  Reports a fentanyl patch is  controlled his pain somewhat.  He reports that he had  a fall yesterday that was assisted.  He denies any nausea, vomiting, diarrhea, cough, congestion, rhinorrhea.  Objective: Vitals:   12/12/18 1330 12/12/18 1834 12/12/18 2036 12/13/18 0432  BP: 140/66 (!) 150/83 123/68 127/71  Pulse: 73 88 78 73  Resp: 20 20 18 18   Temp: 98.1 F (36.7 C) (!) 97.5 F (36.4 C) 97.9 F (36.6 C) (!) 97.5 F (36.4 C)  TempSrc: Oral Oral Oral Oral  SpO2: 96% 90% 100% 100%  Weight:      Height:        Intake/Output Summary (Last 24 hours) at 12/13/2018 1031 Last data filed at 12/12/2018 1032 Gross per 24 hour  Intake -  Output 700 ml  Net -700 ml   Filed Weights   12/07/18 0613 12/08/18 0509 12/09/18 0600  Weight: (!) 144.6 kg (!) 143.2 kg (!) 146.3 kg    Examination:  General exam: Appears calm and comfortable  Respiratory system: No increased work of breathing, anterior lung sounds clear, no wheezes, crackles, rhonchi Cardiovascular system: Regular rate and rhythm, no murmurs Gastrointestinal system: Soft, nondistended, no rebound or guarding, plus bowel sounds. Central nervous system: Alert and oriented.  Bilateral lower extremity strength is 5 out of 5, upper cavity strength is 5 out of 5 Extremities: 1+ lower extremity edema Skin: No rashes over visible skin Psychiatry: Judgement and insight appear normal. Mood & affect appropriate.     Data Reviewed: I have personally reviewed following labs and imaging studies  CBC: Recent Labs  Lab 12/09/18 1951 12/10/18 0530 12/11/18 0459 12/12/18 0427 12/13/18 0539  WBC 5.3 4.8 5.6 6.4 7.2  HGB 10.3* 9.9* 10.2* 9.7* 10.6*  HCT 34.7* 32.7* 34.2* 31.7* 34.2*  MCV 101.8* 101.6* 101.8* 100.0 100.9*  PLT 67* 69* 70* 71* 72*   Basic Metabolic Panel: Recent Labs  Lab 12/06/18 1901 12/07/18 0404 12/09/18 0238 12/10/18 0530 12/13/18 0539  NA 135 134* 135 138 135  K 5.7* 4.8 5.5* 5.0 5.2*  CL 108 106 108 110 104  CO2 20* 19* 19* 20* 22   GLUCOSE 222* 226* 251* 210* 300*  BUN 35* 33* 31* 31* 36*  CREATININE 1.30* 1.20 1.04 0.98 0.98  CALCIUM 8.8* 9.0 9.3 9.4 9.7   GFR: Estimated Creatinine Clearance: 95.1 mL/min (by C-G formula based on SCr of 0.98 mg/dL). Liver Function Tests: No results for input(s): AST, ALT, ALKPHOS, BILITOT, PROT, ALBUMIN in the last 168 hours. No results for input(s): LIPASE, AMYLASE in the last 168 hours. No results for input(s): AMMONIA in the last 168 hours. Coagulation Profile: No results for input(s): INR, PROTIME in the last 168 hours. Cardiac Enzymes: No results for input(s): CKTOTAL, CKMB, CKMBINDEX, TROPONINI in the last 168 hours. BNP (last 3 results) No results for input(s): PROBNP in the last 8760 hours. HbA1C: No results for input(s): HGBA1C in the last 72 hours. CBG: Recent Labs  Lab 12/12/18 1652 12/12/18 2033 12/13/18 0003 12/13/18 0434 12/13/18 0737  GLUCAP 271* 337* 305* 290* 268*   Lipid Profile: No results for input(s): CHOL, HDL, LDLCALC, TRIG, CHOLHDL, LDLDIRECT in the last 72 hours. Thyroid Function Tests: No results for input(s): TSH, T4TOTAL, FREET4, T3FREE, THYROIDAB in the last 72 hours. Anemia Panel: No results for input(s): VITAMINB12, FOLATE, FERRITIN, TIBC, IRON, RETICCTPCT in the last 72 hours. Sepsis Labs: No results for input(s): PROCALCITON, LATICACIDVEN in the last 168 hours.  No results found for this or any previous visit (from the past 240 hour(s)).       Radiology Studies:  No results found.      Scheduled Meds: . calcitonin (salmon)  1 spray Alternating Nares Daily  . carvedilol  6.25 mg Oral BID WC  . cephALEXin  500 mg Oral Q8H  . dexamethasone  8 mg Oral Daily  . feeding supplement (GLUCERNA SHAKE)  237 mL Oral TID BM  . feeding supplement (PRO-STAT SUGAR FREE 64)  30 mL Oral BID  . fentaNYL  1 patch Transdermal Q72H  . gabapentin  300 mg Oral TID  . insulin aspart  0-9 Units Subcutaneous TID WC  . insulin aspart  20 Units  Subcutaneous TID WC  . insulin glargine  30 Units Subcutaneous BID  . lidocaine  1 patch Transdermal Q24H  . multivitamin with minerals  1 tablet Oral Daily  . nystatin  5 mL Oral 6 X Daily  . polyethylene glycol  17 g Oral BID  . rivaroxaban  20 mg Oral Q supper  . senna-docusate  2 tablet Oral BID  . sodium bicarbonate/sodium chloride   Mouth Rinse QID  . sodium chloride flush  3 mL Intravenous Q12H   Continuous Infusions:   LOS: 10 days    Time spent: [place], MD [facility] If 7PM-7AM, please contact night-coverage [email or web address] Password [facility] 12/13/2018, 10:31 AM

## 2018-12-13 NOTE — Progress Notes (Signed)
Robert Gregory is doing okay.  He had a second radiation treatment yesterday.  He tolerated this well.  The back pain is still an issue.  I know that physical therapy is trying to help out.  They really need to work with him and see if the back brace will help and fit properly.  I know that he is a big man and it might be difficult for a back brace to work.  He did have an episode of bowel incontinence early this morning.  He thought this was probably because of the pain medication that he had last night that made him sleep very well and made him very relaxed.  He has had no nausea or vomiting.  He has had no bleeding.  There has been no problems with cough or shortness of breath.  He is still on antibiotics for the cellulitis in the right lower leg.  His labs look pretty good today.  His blood sugars are on the high side because of the Decadron that he is taking for the radiation therapy.  He still has the thrombocytopenia.  His platelet count 72,000 which is holding steady for him.  There is no overall change in his physical exam.  His temperature is 97.5.  Pulse 73.  Blood pressure 127/71.  He has no oral thrush.  He has no rash that I can tell.  His lungs sound pretty clear.  Cardiac exam regular rate and rhythm.  Abdomen is morbidly obese.  Abdomen is soft.  Extremities shows a dressing in the right lower leg.  Robert Gregory has a metastatic hepatocellular carcinoma.  This emanated from chronic cirrhosis secondary to hepatic steatosis.  He is in the hospital for his radiation therapy.  He cannot be back-and-forth because of the medical issues that he is having.  I appreciate the great care that he is getting from all the staff on [address].  The staff is doing a fantastic job.  Lattie Haw, MD  Proverbs 21:3

## 2018-12-14 ENCOUNTER — Ambulatory Visit: Payer: Medicare Other

## 2018-12-14 ENCOUNTER — Ambulatory Visit: Payer: Medicare Other | Admitting: Hematology & Oncology

## 2018-12-14 ENCOUNTER — Other Ambulatory Visit: Payer: Medicare Other

## 2018-12-14 ENCOUNTER — Ambulatory Visit
Admit: 2018-12-14 | Discharge: 2018-12-14 | Disposition: A | Discharge status: Home / Self Care | Payer: Medicare Other | Attending: Radiation Oncology | Admitting: Radiation Oncology

## 2018-12-14 LAB — BASIC METABOLIC PANEL
Anion gap: 8 (ref 5–15)
BUN: 37 mg/dL — ABNORMAL HIGH (ref 8–23)
CO2: 23 mmol/L (ref 22–32)
Calcium: 9.3 mg/dL (ref 8.9–10.3)
Creatinine, Ser: 0.82 mg/dL (ref 0.61–1.24)
GFR calc Af Amer: >60 mL/min (ref >60)
GFR calc non Af Amer: >60 mL/min (ref >60)
Glucose, Bld: 233 mg/dL — ABNORMAL HIGH (ref 70–99)
Potassium: 5.5 mmol/L — ABNORMAL HIGH (ref 3.5–5.1)
Sodium: 132 mmol/L — ABNORMAL LOW (ref 135–145)

## 2018-12-14 LAB — GLUCOSE, CAPILLARY
Glucose-Capillary: 187 mg/dL — ABNORMAL HIGH (ref 70–99)
Glucose-Capillary: 222 mg/dL — ABNORMAL HIGH (ref 70–99)
Glucose-Capillary: 227 mg/dL — ABNORMAL HIGH (ref 70–99)
Glucose-Capillary: 231 mg/dL — ABNORMAL HIGH (ref 70–99)
Glucose-Capillary: 277 mg/dL — ABNORMAL HIGH (ref 70–99)
Glucose-Capillary: 328 mg/dL — ABNORMAL HIGH (ref 70–99)

## 2018-12-14 LAB — BASIC METABOLIC PANEL WITH GFR: Chloride: 101 mmol/L (ref 98–111)

## 2018-12-14 MED ORDER — SODIUM POLYSTYRENE SULFONATE 15 GM/60ML PO SUSP
60.0000 g | Freq: Once | ORAL | Status: AC
  Administered 2018-12-14: 60 g via ORAL
  Filled 2018-12-14: qty 240

## 2018-12-14 NOTE — Progress Notes (Signed)
PT Cancellation Note  Patient Details Name: Robert Gregory MRN: [id number] DOB: 03/07/1941   Cancelled Treatment:    Reason Eval/Treat Not Completed: Attempted PT tx session-pt declined to participate at this time. He requested PT check back another time.    Weston Anna, PT Acute Rehabilitation Services Pager: [phone] Office: [phone]

## 2018-12-14 NOTE — Plan of Care (Signed)

## 2018-12-14 NOTE — Progress Notes (Signed)
PROGRESS NOTE    Robert Gregory  PQZ:[id number] DOB: 1941/09/11 DOA: 12/03/2018 PCP: Seward Carol, MD   Brief Narrative:  78 year old with past medical history relevant for coronary artery disease status post CABG, status post pacemaker placement, atrial fibrillation on rivaroxaban, stage III CKD, obstructive sleep apnea, type 2 diabetes, [name] cirrhosis, recent diagnosis of metastatic hepatocellular carcinoma with disease to thoracic spine, hypertension admitted with near syncope and found to be in complete heart block status post pacemaker adjustment.  His hospital course has been complicated by bilateral lower extremity cellulitis, AKI on CKD complicated by hyperkalemia, dysphasia found to have esophageal dysmotility, recalcitrant back pain due to T7 pathologic fracture transferred to [facility] on 12/10/2018 for initiation of radiation therapy.   Assessment & Plan:   Principal Problem:   Near syncope Active Problems:   Essential hypertension   Hyperlipidemia   Type 2 diabetes mellitus with stage 3 chronic kidney disease (HCC)   Chronic back pain   Obesity, Class III, BMI 40-49.9 (morbid obesity) (HCC)   Cellulitis of right lower extremity   Complete heart block (HCC)   Acute renal failure superimposed on stage 3 chronic kidney disease (HCC)   Hepatocellular carcinoma (HCC)   #) Metastatic hepatocellular carcinoma/recalcitrant T7 pain due to pathologic fracture: Patient continues to report that his pain is somewhat better controlled.  He is quite sensitive to pain medication becomes altered quite quickly. -Continue oral oxycodone 2.5 to 5 mg as needed -Continue fentanyl patch 25 mcg every 72 hours -Continue lidocaine patch -Continue gabapentin 300 mg 3 times daily -Oncology following, [name] -Oncology started steroids as well as given 1 dose of zoledronic acid -Radiation oncology following, treatment started on 12/11/2018 -Neurosurgery did not feel that there was any surgical  options for the patient due to high risk of decompensation  #) Mild hyperkalemia: Patient potassium today is 5.5.  His creatinine does not improve much or change much.  He is not symptomatic. -We will give 1 dose of Kayexalate and recheck tomorrow  #) Type 2 diabetes complicated by neuropathy: Blood sugars have increased on steroids -Continue glargine 25 units twice daily -Continue aspart 20 units with meals 3 times daily -Patient is on U500 at home -Sliding scale insulin, AC at bedtime -Carb restricted diet -We will watch blood sugars closely with initiation of steroids   #) Bilateral lower extremity cellulitis: This appears to have largely resolved with a course of IV and p.o. antibiotics  #) Dysphagia: -Swallow study showed evidence of nonspecific esophageal dysmotility -Gastroenterology has signed off  #) Complete heart block status post pacemaker/near syncope: Patient initially presented with near syncope and was found to be in complete heart block.  Patient did have pacemaker changed from VVI to DDD -Continue telemetry -Cardiology signed off, appreciate recommendations  #) AKI on CKD stage III/hyperkalemia: This is resolved with IV fluids. -Avoid nephrotoxins  #) Coronary artery disease status post CABG/hypertension/hyperlipidemia: -Continue carvedilol 6.25 mg twice daily  #) Paroxysmal atrial fibrillation: -Continue rivaroxaban  #) OSA: -Continue CPAP nightly  Fluids: Tolerating p.o. Electrolytes: Monitor and supplement Nutrition: Carb restricted diet  Disposition: Pending evaluation by [facility] inpatient rehab  DO NOT RESUSCITATE     Consultants:   Neurosurgery  Cardiology  Oncology, [name]  Radiation oncology  Gastroenterology, Sadie Haber  Physical medicine rehabilitation  Procedures:   Pacemaker transition from VVI to DDD pacing on 12/03/2018  Esophagram 12/05/2018 nonspecific esophageal motility disorder  Antimicrobials:   P.o. cephalexin  started 12/08/2018 to 12/12/2018  IV  cefazolin 12/03/2020 to 12/08/2018  IV vancomycin and Zosyn 12/03/2018 to 12/04/2018   Subjective: This morning the patient reports he is doing well.  He reports that the current dosage of fentanyl patch is working well for his pain.  He denies any nausea, vomiting, diarrhea, cough, congestion, rhinorrhea.  Objective: Vitals:   12/13/18 1246 12/13/18 1951 12/13/18 2029 12/14/18 0457  BP: 132/76  134/71 119/61  Pulse: 67 76 72 67  Resp: 20 19 18 18   Temp: 97.7 F (36.5 C)  (!) 97.5 F (36.4 C) (!) 97.5 F (36.4 C)  TempSrc: Oral  Oral Oral  SpO2: 94% (!) 88% 100% 95%  Weight:      Height:        Intake/Output Summary (Last 24 hours) at 12/14/2018 0941 Last data filed at 12/13/2018 2331 Gross per 24 hour  Intake 3 ml  Output 1175 ml  Net -1172 ml   Filed Weights   12/07/18 0613 12/08/18 0509 12/09/18 0600  Weight: (!) 144.6 kg (!) 143.2 kg (!) 146.3 kg    Examination:  General exam: Appears calm and comfortable  Respiratory system: No increased work of breathing, anterior lung sounds clear, no wheezes, crackles, rhonchi Cardiovascular system: Regular rate and rhythm, no murmurs Gastrointestinal system: Soft, nondistended, no rebound or guarding, plus bowel sounds. Central nervous system: Alert and oriented.  Bilateral lower extremity strength is 5 out of 5, upper cavity strength is 5 out of 5 Extremities: 1+ lower extremity edema Skin: No rashes over visible skin Psychiatry: Judgement and insight appear normal. Mood & affect appropriate.     Data Reviewed: I have personally reviewed following labs and imaging studies  CBC: Recent Labs  Lab 12/09/18 1951 12/10/18 0530 12/11/18 0459 12/12/18 0427 12/13/18 0539  WBC 5.3 4.8 5.6 6.4 7.2  HGB 10.3* 9.9* 10.2* 9.7* 10.6*  HCT 34.7* 32.7* 34.2* 31.7* 34.2*  MCV 101.8* 101.6* 101.8* 100.0 100.9*  PLT 67* 69* 70* 71* 72*   Basic Metabolic Panel: Recent Labs  Lab 12/09/18 0238  12/10/18 0530 12/13/18 0539 12/14/18 0411  NA 135 138 135 132*  K 5.5* 5.0 5.2* 5.5*  CL 108 110 104 101  CO2 19* 20* 22 23  GLUCOSE 251* 210* 300* 233*  BUN 31* 31* 36* 37*  CREATININE 1.04 0.98 0.98 0.82  CALCIUM 9.3 9.4 9.7 9.3   GFR: Estimated Creatinine Clearance: 113.6 mL/min (by C-G formula based on SCr of 0.82 mg/dL). Liver Function Tests: No results for input(s): AST, ALT, ALKPHOS, BILITOT, PROT, ALBUMIN in the last 168 hours. No results for input(s): LIPASE, AMYLASE in the last 168 hours. No results for input(s): AMMONIA in the last 168 hours. Coagulation Profile: No results for input(s): INR, PROTIME in the last 168 hours. Cardiac Enzymes: No results for input(s): CKTOTAL, CKMB, CKMBINDEX, TROPONINI in the last 168 hours. BNP (last 3 results) No results for input(s): PROBNP in the last 8760 hours. HbA1C: No results for input(s): HGBA1C in the last 72 hours. CBG: Recent Labs  Lab 12/13/18 1615 12/13/18 2030 12/14/18 0005 12/14/18 0451 12/14/18 0755  GLUCAP 274* 248* 227* 222* 187*   Lipid Profile: No results for input(s): CHOL, HDL, LDLCALC, TRIG, CHOLHDL, LDLDIRECT in the last 72 hours. Thyroid Function Tests: No results for input(s): TSH, T4TOTAL, FREET4, T3FREE, THYROIDAB in the last 72 hours. Anemia Panel: No results for input(s): VITAMINB12, FOLATE, FERRITIN, TIBC, IRON, RETICCTPCT in the last 72 hours. Sepsis Labs: No results for input(s): PROCALCITON, LATICACIDVEN in the last 168 hours.  No results found for this or any previous visit (from the past 240 hour(s)).       Radiology Studies: No results found.      Scheduled Meds: . calcitonin (salmon)  1 spray Alternating Nares Daily  . carvedilol  6.25 mg Oral BID WC  . cephALEXin  500 mg Oral Q8H  . dexamethasone  8 mg Oral Daily  . feeding supplement (GLUCERNA SHAKE)  237 mL Oral TID BM  . feeding supplement (PRO-STAT SUGAR FREE 64)  30 mL Oral BID  . fentaNYL  1 patch Transdermal Q72H   . gabapentin  300 mg Oral TID  . insulin aspart  0-9 Units Subcutaneous TID WC  . insulin aspart  20 Units Subcutaneous TID WC  . insulin glargine  30 Units Subcutaneous BID  . lidocaine  1 patch Transdermal Q24H  . multivitamin with minerals  1 tablet Oral Daily  . nystatin  5 mL Oral 6 X Daily  . polyethylene glycol  17 g Oral BID  . rivaroxaban  20 mg Oral Q supper  . senna-docusate  2 tablet Oral BID  . sodium bicarbonate/sodium chloride   Mouth Rinse QID  . sodium chloride flush  3 mL Intravenous Q12H  . sodium polystyrene  60 g Oral Once   Continuous Infusions:   LOS: 11 days    Time spent: [place], MD [facility] If 7PM-7AM, please contact night-coverage [email or web address] Password [facility] 12/14/2018, 9:41 AM

## 2018-12-14 NOTE — Progress Notes (Addendum)
Pt taken off unit for radiation.

## 2018-12-14 NOTE — Progress Notes (Signed)
OT Cancellation Note  Patient Details Name: Robert Gregory MRN: [id number] DOB: 26-Jan-1941   Cancelled Treatment:    Reason Eval/Treat Not Completed: Other (comment)  Pt sleeping soundly.  Spoke with RN. Will check back on pt later in the day or next day  Kari Baars, [place] [phone] Office- [phone], Thereasa Parkin 12/14/2018, 12:56 PM

## 2018-12-14 NOTE — Progress Notes (Signed)
Mr. Coger had a pretty decent day yesterday.  He is doing well with the radiation to his back.  He has not had any problems with this so far.  His appetite is coming back a little bit.  He is eating a little bit more.  He did not have any issues with incontinence.  At some point, I probably will think about getting a Port-A-Cath into him.  He will need this if we are to do any treatment for his liver cancer.  I will plan to use immunotherapy with antiangiogenic therapy.  Both are IV.  He has had no fever.  He has had no bleeding.  He is on blood thinner with Xarelto.  We will probably stop this and put him on 2 Lovenox before he has a Port-A-Cath.  It sounds like he will have 10 days of radiation therapy.  That would put him through next week.  He has no thrush.  His blood sugars have been a little bit on the high side.  He is on insulin for coverage.  His CBC was not done today.  Of note, his last echocardiogram was back in December.  His echocardiogram looked okay.  As such, I do not think we have to repeat this.  I know that he is getting fantastic care from everybody on [address] [facility].  I know that he is very appreciative of the staff.  Lattie Haw, MD  Psalm 119:76

## 2018-12-15 ENCOUNTER — Ambulatory Visit
Admit: 2018-12-15 | Discharge: 2018-12-15 | Disposition: A | Discharge status: Home / Self Care | Payer: Medicare Other | Attending: Radiation Oncology | Admitting: Radiation Oncology

## 2018-12-15 ENCOUNTER — Ambulatory Visit: Payer: Medicare Other

## 2018-12-15 ENCOUNTER — Inpatient Hospital Stay (HOSPITAL_COMMUNITY): Payer: Medicare Other

## 2018-12-15 LAB — GLUCOSE, CAPILLARY
GLUCOSE-CAPILLARY: 163 mg/dL — AB (ref 70–99)
Glucose-Capillary: 150 mg/dL — ABNORMAL HIGH (ref 70–99)
Glucose-Capillary: 192 mg/dL — ABNORMAL HIGH (ref 70–99)
Glucose-Capillary: 215 mg/dL — ABNORMAL HIGH (ref 70–99)
Glucose-Capillary: 242 mg/dL — ABNORMAL HIGH (ref 70–99)
Glucose-Capillary: 260 mg/dL — ABNORMAL HIGH (ref 70–99)

## 2018-12-15 LAB — BASIC METABOLIC PANEL
Anion gap: 7 (ref 5–15)
CO2: 28 mmol/L (ref 22–32)
Calcium: 9.4 mg/dL (ref 8.9–10.3)
Chloride: 102 mmol/L (ref 98–111)
Creatinine, Ser: 0.72 mg/dL (ref 0.61–1.24)
GFR calc Af Amer: >60 mL/min (ref >60)
Glucose, Bld: 213 mg/dL — ABNORMAL HIGH (ref 70–99)
Potassium: 4.9 mmol/L (ref 3.5–5.1)
Sodium: 137 mmol/L (ref 135–145)

## 2018-12-15 LAB — CBC
HCT: 35.7 % — ABNORMAL LOW (ref 39.0–52.0)
Hemoglobin: 10.9 g/dL — ABNORMAL LOW (ref 13.0–17.0)
MCH: 30.8 pg (ref 26.0–34.0)
MCHC: 30.5 g/dL (ref 30.0–36.0)
MCV: 100.8 fL — ABNORMAL HIGH (ref 80.0–100.0)
Platelets: 75 10*3/uL — ABNORMAL LOW (ref 150–400)
RBC: 3.54 MIL/uL — ABNORMAL LOW (ref 4.22–5.81)
RDW: 18.8 % — ABNORMAL HIGH (ref 11.5–15.5)
WBC: 7.7 10*3/uL (ref 4.0–10.5)
nRBC: 0.3 % — ABNORMAL HIGH (ref 0.0–0.2)

## 2018-12-15 LAB — BASIC METABOLIC PANEL WITH GFR
BUN: 35 mg/dL — ABNORMAL HIGH (ref 8–23)
GFR calc non Af Amer: >60 mL/min (ref >60)

## 2018-12-15 MED ORDER — INSULIN ASPART 100 UNIT/ML ~~LOC~~ SOLN
10.0000 [IU] | Freq: Three times a day (TID) | SUBCUTANEOUS | Status: DC
  Administered 2018-12-15 – 2018-12-28 (×29): 10 [IU] via SUBCUTANEOUS

## 2018-12-15 MED ORDER — INSULIN GLARGINE 100 UNIT/ML ~~LOC~~ SOLN
20.0000 [IU] | Freq: Two times a day (BID) | SUBCUTANEOUS | Status: DC
  Administered 2018-12-15 – 2018-12-24 (×18): 20 [IU] via SUBCUTANEOUS
  Filled 2018-12-15 (×18): qty 0.2

## 2018-12-15 NOTE — Progress Notes (Signed)
Physical Therapy Treatment Patient Details Name: Robert Gregory MRN: [id number] DOB: 09/22/1940 Today's Date: 12/15/2018    History of Present Illness Pt is a 78 y/o male admitted 12/03/18 with dizziness, bradycardia and near syncope. Pt with complete heart block and metastatic hepatocellular carinoma with T7 compression fx. Not a candidate for verterbroplasty, to begin palliative radiation at [facility] 3/20. PMH includes DM, MI, ICD, HTN, HLD, A-fib.    PT Comments    Pt is progressing very slowly; after multiple refusals, pt agreeable to work with PT today; pt fatigues rapidly, able to amb 18'/15' with min assist and +2 for chair and safety, seated rest needed between distances, VSS.  Recommend SNF post acute d/t pt's limited activity tolerance and pain level. I do not think he will be able to tolerate CIR.   Follow Up Recommendations  SNF;Other (comment)(vs home wtih 24hr supervision/HHPT)     Equipment Recommendations  None recommended by PT    Recommendations for Other Services       Precautions / Restrictions Precautions Precautions: Fall;Back Precaution Booklet Issued: No Precaution Comments: pt opting against use of brace (for comfort) - pt states MD stated this was okay. Reviewed back precautions with pt this session to protect spine (no bending, twisting, arching spine; no lifting). Watch sats with mobility Required Braces or Orthoses: Spinal Brace Spinal Brace: Thoracolumbosacral orthotic Restrictions Weight Bearing Restrictions: No    Mobility  Bed Mobility Overal bed mobility: Needs Assistance Bed Mobility: Rolling Rolling: Min assist Sidelying to sit: Mod assist     Sit to sidelying: Mod assist;+2 for safety/equipment General bed mobility comments: multi-modal cues for log roll, repeated x2 for pad positioning  Transfers Overall transfer level: Needs assistance Equipment used: Rolling walker (2 wheeled) Transfers: Sit to/from Stand Sit to Stand: Min  assist         General transfer comment: Min assist for initial power up.  Verbal cuing to use at least one UE to push from bed for stability and to reach back and control descent   Ambulation/Gait Ambulation/Gait assistance: Min guard;+2 safety/equipment Gait Distance (Feet): 18 Feet(15' and 5' ) Assistive device: Rolling walker (2 wheeled) Gait Pattern/deviations: Step-through pattern;Decreased stride length;Trunk flexed Gait velocity: decr    General Gait Details: verbal cues for RW position, to stay inside RW; HR 90s, SpO2 93% on 2L ; fatigues rapidly   Stairs             Wheelchair Mobility    Modified Rankin (Stroke Patients Only)       Balance             Standing balance-Leahy Scale: Poor Standing balance comment: relies on RW for support                             Cognition Arousal/Alertness: Awake/alert Behavior During Therapy: WFL for tasks assessed/performed Overall Cognitive Status: Within Functional Limits for tasks assessed                                        Exercises      General Comments        Pertinent Vitals/Pain Pain Assessment: 0-10 Pain Score: 7  Pain Location: back  Pain Descriptors / Indicators: Discomfort;Sore Pain Intervention(s): Limited activity within patient's tolerance;Monitored during session;Premedicated before session;Repositioned    Home Living  Prior Function            PT Goals (current goals can now be found in the care plan section) Acute Rehab PT Goals Patient Stated Goal: to get this pain to decrease PT Goal Formulation: With patient/family Time For Goal Achievement: 12/19/18 Potential to Achieve Goals: Good Progress towards PT goals: Progressing toward goals    Frequency    Min 3X/week      PT Plan Discharge plan needs to be updated    Co-evaluation              AM-PAC PT "6 Clicks" Mobility   Outcome Measure  Help  needed turning from your back to your side while in a flat bed without using bedrails?: A Little Help needed moving from lying on your back to sitting on the side of a flat bed without using bedrails?: A Lot Help needed moving to and from a bed to a chair (including a wheelchair)?: A Little Help needed standing up from a chair using your arms (e.g., wheelchair or bedside chair)?: A Little Help needed to walk in hospital room?: A Little Help needed climbing 3-5 steps with a railing? : A Lot 6 Click Score: 16    End of Session Equipment Utilized During Treatment: Gait belt Activity Tolerance: Patient limited by fatigue Patient left: in bed;with call bell/phone within reach;with bed alarm set Nurse Communication: Mobility status PT Visit Diagnosis: Pain;Muscle weakness (generalized) (M62.81);Unsteadiness on feet (R26.81) Pain - part of body: (back)     Time: 1950-9326 PT Time Calculation (min) (ACUTE ONLY): 24 min  Charges:  $Gait Training: 23-37 mins                     Kenyon Ana, PT  Pager: [phone] Acute Rehab Dept [facility]): [phone]   12/15/2018    [facility] 12/15/2018, 6:01 PM

## 2018-12-15 NOTE — Progress Notes (Signed)
Held morning dose of insulin per MD Brigid Re)

## 2018-12-15 NOTE — Progress Notes (Signed)
[facility] Therapy Treatment Record Phone [phone]   Radiation Therapy was administered to Robert Gregory on: 12/15/2018  10:17 AM and was treatment # 5 out of a planned course of 10 treatments.  Radiation Treatment  1). Beam photons with 6-10 energy  2). Brachytherapy None  3). Stereotactic Radiosurgery None  4). Other Radiation None     Ardra Kuznicki A Monterio Bob, RT (T)

## 2018-12-15 NOTE — Progress Notes (Signed)
OT Cancellation Note  Patient Details Name: KABIR BRANNOCK MRN: [id number] DOB: [birth year]   Cancelled Treatment:    Reason Eval/Treat Not Completed: Other (comment). Pt declines OOB; back discomfort today  Caley Volkert 12/15/2018, 2:55 PM  Lesle Chris, OTR/L Acute Rehabilitation Services [phone] [facility] pager [phone] office 12/15/2018

## 2018-12-15 NOTE — Progress Notes (Signed)
PROGRESS NOTE    Robert Gregory  MOL:[id number] DOB: 01-07-1941 DOA: 12/03/2018 PCP: Seward Carol, MD   Brief Narrative:  78 year old with past medical history relevant for coronary artery disease status post CABG, status post pacemaker placement, atrial fibrillation on rivaroxaban, stage III CKD, obstructive sleep apnea, type 2 diabetes, [name] cirrhosis, recent diagnosis of metastatic hepatocellular carcinoma with disease to thoracic spine, hypertension admitted with near syncope and found to be in complete heart block status post pacemaker adjustment.  His hospital course has been complicated by bilateral lower extremity cellulitis, AKI on CKD complicated by hyperkalemia, dysphasia found to have esophageal dysmotility, recalcitrant back pain due to T7 pathologic fracture transferred to [facility] on 12/10/2018 for initiation of radiation therapy.   Assessment & Plan:   Principal Problem:   Near syncope Active Problems:   Essential hypertension   Hyperlipidemia   Type 2 diabetes mellitus with stage 3 chronic kidney disease (HCC)   Chronic back pain   Obesity, Class III, BMI 40-49.9 (morbid obesity) (HCC)   Cellulitis of right lower extremity   Complete heart block (HCC)   Acute renal failure superimposed on stage 3 chronic kidney disease (HCC)   Hepatocellular carcinoma (HCC)   #) Metastatic hepatocellular carcinoma/recalcitrant T7 pain due to pathologic fracture: Patient continues to report that his pain is somewhat better controlled.  He is quite sensitive to pain medication becomes altered quite quickly. -Continue oral oxycodone 2.5 to 5 mg as needed -Continue fentanyl patch 25 mcg every 72 hours -Continue lidocaine patch -Continue gabapentin 300 mg 3 times daily -Oncology following, [name] -Oncology started steroids as well as given 1 dose of zoledronic acid and calcitonin -Radiation oncology following, treatment started on 12/11/2018 -Neurosurgery did not feel that there  was any surgical options for the patient due to high risk of decompensation  #) Mild hyperkalemia: Resolved after 1 dose of Kayexalate  #) Type 2 diabetes complicated by neuropathy: Blood sugars have increased on steroids -Continue glargine 25 units twice daily -Decreased aspart 10 units with meals 3 times daily -Patient is on U500 at home -Sliding scale insulin, AC at bedtime -Carb restricted diet   #) Bilateral lower extremity cellulitis: This appears to have largely resolved with a course of IV and p.o. antibiotics  #) Dysphagia: -Swallow study showed evidence of nonspecific esophageal dysmotility -Gastroenterology has signed off  #) Complete heart block status post pacemaker/near syncope: Patient initially presented with near syncope and was found to be in complete heart block.  Patient did have pacemaker changed from VVI to DDD -Continue telemetry -Cardiology signed off, appreciate recommendations  #) AKI on CKD stage III/hyperkalemia: This is resolved with IV fluids. -Avoid nephrotoxins  #) Coronary artery disease status post CABG/hypertension/hyperlipidemia: -Continue carvedilol 6.25 mg twice daily  #) Paroxysmal atrial fibrillation: -Continue rivaroxaban  #) OSA: -Continue CPAP nightly  Fluids: Tolerating p.o. Electrolytes: Monitor and supplement Nutrition: Carb restricted diet  Disposition: Pending evaluation by [facility] inpatient rehab  DO NOT RESUSCITATE     Consultants:   Neurosurgery  Cardiology  Oncology, [name]  Radiation oncology  Gastroenterology, Sadie Haber  Physical medicine rehabilitation  Procedures:   Pacemaker transition from VVI to DDD pacing on 12/03/2018  Esophagram 12/05/2018 nonspecific esophageal motility disorder  Antimicrobials:   P.o. cephalexin started 12/08/2018 to 12/12/2018  IV cefazolin 12/03/2020 to 12/08/2018  IV vancomycin and Zosyn 12/03/2018 to 12/04/2018   Subjective: This morning the patient reports he is doing  okay.  He reports that he is breathing harder  but he is not short of breath.  He reports his pain control is fairly well.  He is somewhat frustrated with the situation.  Objective: Vitals:   12/14/18 1532 12/14/18 1941 12/14/18 2050 12/15/18 0430  BP: 135/79  140/86 (!) 144/86  Pulse: 77 74 82 72  Resp: 20 18 20 20   Temp: 97.7 F (36.5 C)  97.7 F (36.5 C) 97.7 F (36.5 C)  TempSrc: Oral  Oral   SpO2: 94% 96% 96% 95%  Weight:      Height:        Intake/Output Summary (Last 24 hours) at 12/15/2018 0919 Last data filed at 12/14/2018 2119 Gross per 24 hour  Intake 480 ml  Output 1100 ml  Net -620 ml   Filed Weights   12/07/18 9562 12/08/18 0509 12/09/18 0600  Weight: (!) 144.6 kg (!) 143.2 kg (!) 146.3 kg    Examination:  General exam: Appears calm and comfortable  Respiratory system: Mild increased work of breathing, anterior lung sounds clear, no wheezes, crackles, rhonchi Cardiovascular system: Regular rate and rhythm, no murmurs Gastrointestinal system: Soft, nondistended, no rebound or guarding, plus bowel sounds. Central nervous system: Alert and oriented.  Bilateral lower extremity strength is 5 out of 5, upper cavity strength is 5 out of 5 Extremities: 1+ lower extremity edema Skin: No rashes over visible skin Psychiatry: Judgement and insight appear normal. Mood & affect appropriate.     Data Reviewed: I have personally reviewed following labs and imaging studies  CBC: Recent Labs  Lab 12/10/18 0530 12/11/18 0459 12/12/18 0427 12/13/18 0539 12/15/18 0428  WBC 4.8 5.6 6.4 7.2 7.7  HGB 9.9* 10.2* 9.7* 10.6* 10.9*  HCT 32.7* 34.2* 31.7* 34.2* 35.7*  MCV 101.6* 101.8* 100.0 100.9* 100.8*  PLT 69* 70* 71* 72* 75*   Basic Metabolic Panel: Recent Labs  Lab 12/09/18 0238 12/10/18 0530 12/13/18 0539 12/14/18 0411 12/15/18 0428  NA 135 138 135 132* 137  K 5.5* 5.0 5.2* 5.5* 4.9  CL 108 110 104 101 102  CO2 19* 20* 22 23 28   GLUCOSE 251* 210* 300* 233*  213*  BUN 31* 31* 36* 37* 35*  CREATININE 1.04 0.98 0.98 0.82 0.72  CALCIUM 9.3 9.4 9.7 9.3 9.4   GFR: Estimated Creatinine Clearance: 116.5 mL/min (by C-G formula based on SCr of 0.72 mg/dL). Liver Function Tests: No results for input(s): AST, ALT, ALKPHOS, BILITOT, PROT, ALBUMIN in the last 168 hours. No results for input(s): LIPASE, AMYLASE in the last 168 hours. No results for input(s): AMMONIA in the last 168 hours. Coagulation Profile: No results for input(s): INR, PROTIME in the last 168 hours. Cardiac Enzymes: No results for input(s): CKTOTAL, CKMB, CKMBINDEX, TROPONINI in the last 168 hours. BNP (last 3 results) No results for input(s): PROBNP in the last 8760 hours. HbA1C: No results for input(s): HGBA1C in the last 72 hours. CBG: Recent Labs  Lab 12/14/18 1643 12/14/18 2052 12/15/18 0045 12/15/18 0426 12/15/18 0800  GLUCAP 277* 328* 260* 192* 150*   Lipid Profile: No results for input(s): CHOL, HDL, LDLCALC, TRIG, CHOLHDL, LDLDIRECT in the last 72 hours. Thyroid Function Tests: No results for input(s): TSH, T4TOTAL, FREET4, T3FREE, THYROIDAB in the last 72 hours. Anemia Panel: No results for input(s): VITAMINB12, FOLATE, FERRITIN, TIBC, IRON, RETICCTPCT in the last 72 hours. Sepsis Labs: No results for input(s): PROCALCITON, LATICACIDVEN in the last 168 hours.  No results found for this or any previous visit (from the past 240 hour(s)).  Radiology Studies: No results found.      Scheduled Meds: . calcitonin (salmon)  1 spray Alternating Nares Daily  . carvedilol  6.25 mg Oral BID WC  . cephALEXin  500 mg Oral Q8H  . dexamethasone  8 mg Oral Daily  . feeding supplement (GLUCERNA SHAKE)  237 mL Oral TID BM  . feeding supplement (PRO-STAT SUGAR FREE 64)  30 mL Oral BID  . fentaNYL  1 patch Transdermal Q72H  . gabapentin  300 mg Oral TID  . insulin aspart  0-9 Units Subcutaneous TID WC  . insulin aspart  10 Units Subcutaneous TID WC  . insulin  glargine  30 Units Subcutaneous BID  . lidocaine  1 patch Transdermal Q24H  . multivitamin with minerals  1 tablet Oral Daily  . nystatin  5 mL Oral 6 X Daily  . polyethylene glycol  17 g Oral BID  . rivaroxaban  20 mg Oral Q supper  . senna-docusate  2 tablet Oral BID  . sodium bicarbonate/sodium chloride   Mouth Rinse QID  . sodium chloride flush  3 mL Intravenous Q12H   Continuous Infusions:   LOS: 12 days    Time spent: [place], MD [facility] If 7PM-7AM, please contact night-coverage [email or web address] Password [place] [place]ala 12/15/2018, 9:19 AM

## 2018-12-16 LAB — BASIC METABOLIC PANEL
Anion gap: 8 (ref 5–15)
BUN: 37 mg/dL — ABNORMAL HIGH (ref 8–23)
CO2: 29 mmol/L (ref 22–32)
Calcium: 9.3 mg/dL (ref 8.9–10.3)
Chloride: 100 mmol/L (ref 98–111)
Creatinine, Ser: 0.77 mg/dL (ref 0.61–1.24)
GFR calc Af Amer: >60 mL/min (ref >60)
Glucose, Bld: 206 mg/dL — ABNORMAL HIGH (ref 70–99)
Potassium: 5.2 mmol/L — ABNORMAL HIGH (ref 3.5–5.1)
Sodium: 137 mmol/L (ref 135–145)

## 2018-12-16 LAB — GLUCOSE, CAPILLARY
Glucose-Capillary: 158 mg/dL — ABNORMAL HIGH (ref 70–99)
Glucose-Capillary: 202 mg/dL — ABNORMAL HIGH (ref 70–99)
Glucose-Capillary: 212 mg/dL — ABNORMAL HIGH (ref 70–99)
Glucose-Capillary: 218 mg/dL — ABNORMAL HIGH (ref 70–99)
Glucose-Capillary: 312 mg/dL — ABNORMAL HIGH (ref 70–99)
Glucose-Capillary: 325 mg/dL — ABNORMAL HIGH (ref 70–99)

## 2018-12-16 LAB — BASIC METABOLIC PANEL WITH GFR: GFR calc non Af Amer: >60 mL/min (ref >60)

## 2018-12-16 LAB — CBC
HCT: 37.5 % — ABNORMAL LOW (ref 39.0–52.0)
Hemoglobin: 11.3 g/dL — ABNORMAL LOW (ref 13.0–17.0)
MCH: 30.5 pg (ref 26.0–34.0)
MCHC: 30.1 g/dL (ref 30.0–36.0)
MCV: 101.4 fL — ABNORMAL HIGH (ref 80.0–100.0)
Platelets: 70 K/uL — ABNORMAL LOW (ref 150–400)
RBC: 3.7 MIL/uL — ABNORMAL LOW (ref 4.22–5.81)
RDW: 19.2 % — ABNORMAL HIGH (ref 11.5–15.5)
WBC: 7 10*3/uL (ref 4.0–10.5)
nRBC: 0 % (ref 0.0–0.2)

## 2018-12-16 MED ORDER — FUROSEMIDE 10 MG/ML IJ SOLN
40.0000 mg | Freq: Two times a day (BID) | INTRAMUSCULAR | Status: DC
  Administered 2018-12-16 – 2018-12-19 (×7): 40 mg via INTRAVENOUS
  Filled 2018-12-16 (×7): qty 4

## 2018-12-16 MED ORDER — PEPPERMINT OIL 90 MG PO CPCR: 90.0000 mg | ORAL_CAPSULE | Freq: Two times a day (BID) | ORAL | Status: DC

## 2018-12-16 MED ORDER — COENZYME Q10 200 MG PO CAPS: 200.0000 mg | ORAL_CAPSULE | Freq: Every day | ORAL | Status: DC

## 2018-12-16 MED ORDER — SPIRONOLACTONE 25 MG PO TABS
25.0000 mg | ORAL_TABLET | Freq: Every day | ORAL | Status: DC
  Administered 2018-12-16 – 2018-12-21 (×5): 25 mg via ORAL
  Filled 2018-12-16 (×6): qty 1

## 2018-12-16 MED ORDER — EZETIMIBE 10 MG PO TABS
10.0000 mg | ORAL_TABLET | Freq: Every day | ORAL | Status: DC
  Administered 2018-12-16 – 2018-12-28 (×12): 10 mg via ORAL
  Filled 2018-12-16 (×12): qty 1

## 2018-12-16 MED ORDER — FENOFIBRATE 160 MG PO TABS
160.0000 mg | ORAL_TABLET | Freq: Every day | ORAL | Status: DC
  Administered 2018-12-16 – 2018-12-28 (×11): 160 mg via ORAL
  Filled 2018-12-16 (×13): qty 1

## 2018-12-16 MED ORDER — PRAVASTATIN SODIUM 40 MG PO TABS
80.0000 mg | ORAL_TABLET | Freq: Every day | ORAL | Status: DC
  Administered 2018-12-17 – 2018-12-24 (×8): 80 mg via ORAL
  Filled 2018-12-16: qty 4
  Filled 2018-12-16 (×8): qty 2

## 2018-12-16 MED ORDER — VITAMIN D 25 MCG (1000 UNIT) PO TABS
2000.0000 [IU] | ORAL_TABLET | Freq: Every day | ORAL | Status: DC
  Administered 2018-12-16 – 2018-12-28 (×12): 2000 [IU] via ORAL
  Filled 2018-12-16 (×12): qty 2

## 2018-12-16 MED ORDER — IRBESARTAN 300 MG PO TABS
300.0000 mg | ORAL_TABLET | Freq: Every day | ORAL | Status: DC
  Administered 2018-12-16 – 2018-12-21 (×5): 300 mg via ORAL
  Filled 2018-12-16 (×6): qty 1

## 2018-12-16 MED ORDER — LEVOMEFOLATE GLUCOSAMINE 400 MCG PO CAPS: 2000.0000 ug | ORAL_CAPSULE | Freq: Every day | ORAL | Status: DC

## 2018-12-16 MED ORDER — SACCHAROMYCES BOULARDII 250 MG PO CAPS
250.0000 mg | ORAL_CAPSULE | Freq: Every day | ORAL | Status: DC
  Administered 2018-12-16 – 2018-12-28 (×12): 250 mg via ORAL
  Filled 2018-12-16 (×12): qty 1

## 2018-12-16 MED ORDER — ADULT MULTIVITAMIN W/MINERALS CH
1.0000 | ORAL_TABLET | Freq: Every day | ORAL | Status: DC
  Filled 2018-12-16: qty 1

## 2018-12-16 MED ORDER — SODIUM POLYSTYRENE SULFONATE 15 GM/60ML PO SUSP
45.0000 g | Freq: Once | ORAL | Status: AC
  Administered 2018-12-16: 45 g via ORAL
  Filled 2018-12-16: qty 180

## 2018-12-16 MED ORDER — OMEGA-3-ACID ETHYL ESTERS 1 G PO CAPS
2.0000 g | ORAL_CAPSULE | Freq: Two times a day (BID) | ORAL | Status: DC
  Administered 2018-12-16 – 2018-12-28 (×24): 2 g via ORAL
  Filled 2018-12-16 (×24): qty 2

## 2018-12-16 NOTE — Progress Notes (Addendum)
PROGRESS NOTE    Robert Gregory  QIO:[id number] DOB: 08/07/1941 DOA: 12/03/2018 PCP: Seward Carol, MD   Brief Narrative:  78 year old with past medical history relevant for coronary artery disease status post CABG, status post pacemaker placement, atrial fibrillation on rivaroxaban, stage III CKD, obstructive sleep apnea, type 2 diabetes, [name] cirrhosis, recent diagnosis of metastatic hepatocellular carcinoma with disease to thoracic spine, hypertension admitted with near syncope and found to be in complete heart block status post pacemaker adjustment.  His hospital course has been complicated by bilateral lower extremity cellulitis, AKI on CKD complicated by hyperkalemia, dysphasia found to have esophageal dysmotility, recalcitrant back pain due to T7 pathologic fracture transferred to [facility] on 12/10/2018 for initiation of radiation therapy.   Assessment & Plan:   Principal Problem:   Near syncope Active Problems:   Essential hypertension   Hyperlipidemia   Type 2 diabetes mellitus with stage 3 chronic kidney disease (HCC)   Chronic back pain   Obesity, Class III, BMI 40-49.9 (morbid obesity) (HCC)   Cellulitis of right lower extremity   Complete heart block (HCC)   Acute renal failure superimposed on stage 3 chronic kidney disease (HCC)   Hepatocellular carcinoma ([place])  #) Acute encephalopathy due to medication: Overnight patient was noted to be confused and weak and complained of vague nonspecific neurological symptomatology.  Stat head CT was performed.  Patient returned to baseline. -Will review patient's pain medications however delicate balancing act between controlling the patient's pain and his sensitivity especially to opioid analgesics  #) Metastatic hepatocellular carcinoma/recalcitrant T7 pain due to pathologic fracture: Patient has been intermittently refusing certain pain medications as he is quite sensitive to them. -Discontinued oral oxycodone 2.5 to 5 mg as needed  -Start tramadol 25 to 50 mg every 6 hours as needed -Patient is refused very rarely intermittently used NSAIDs in the past, will discuss with patient again -Continue fentanyl patch 25 mcg every 72 hours -Continue lidocaine patch -Increase gabapentin 400 mg 3 times daily -Oncology following, [name] -Oncology started steroids as well as given 1 dose of zoledronic acid and calcitonin -Radiation oncology following, treatment started on 12/11/2018 -Neurosurgery did not feel that there was any surgical options for the patient due to high risk of decompensation  #) Acute on chronic diastolic heart failure: Patient has an echo on 09/08/2018 that showed a normal ejection fraction but could not evaluate diastolic function.  This appears to have improved with starting IV diuresis.  Would continue wanted more additional day of IV diuresis. -Hold home oral furosemide -Continue IV furosemide 40 mg twice daily  #) Paroxysmal atrial fibrillation: -Continue rivaroxaban, oncology will discuss with general surgery about transitioning patient to enoxaparin subcu and possibly receiving port while inpatient. -Continue beta-blocker  #) Type 2 diabetes complicated by neuropathy: Blood sugars have increased on steroids -Continue glargine 20 units twice daily -Continue aspart 10 units with meals 3 times daily -Patient is on U500 at home -Sliding scale insulin, AC at bedtime -Carb restricted diet  #) Bilateral lower extremity cellulitis: This appears to have largely resolved with a course of IV and p.o. antibiotics  #) Dysphagia: -Swallow study showed evidence of nonspecific esophageal dysmotility -Gastroenterology has signed off  #) Complete heart block status post pacemaker/near syncope: Patient initially presented with near syncope and was found to be in complete heart block.  Patient did have pacemaker changed from VVI to DDD -Continue telemetry -Cardiology signed off, appreciate recommendations  #)  AKI on CKD stage III/hyperkalemia: This  is resolved with IV fluids. -Avoid nephrotoxins  #) Coronary artery disease status post CABG/hypertension/hyperlipidemia: -Continue carvedilol 6.25 mg twice daily -Continue ARB  #) OSA: -Continue CPAP nightly  Fluids: Tolerating p.o. Electrolytes: Monitor and supplement Nutrition: Carb restricted diet  Disposition: Pending discharge to skilled nursing facility  DO NOT RESUSCITATE     Consultants:   Neurosurgery  Cardiology  Oncology, [name]  Radiation oncology  Gastroenterology, Sadie Haber  Physical medicine rehabilitation  Procedures:   Pacemaker transition from VVI to DDD pacing on 12/03/2018  Esophagram 12/05/2018 nonspecific esophageal motility disorder  Antimicrobials:   P.o. cephalexin started 12/08/2018 to 12/12/2018  IV cefazolin 12/03/2020 to 12/08/2018  IV vancomycin and Zosyn 12/03/2018 to 12/04/2018   Subjective: This morning the patient reports that he is doing well.  He reports his breathing is improved.  He does not member the events of last night to clearly.  He reports his pain is somewhat better controlled.  He does not remember refusing pain medications or physical therapy.  Objective: Vitals:   12/15/18 1411 12/15/18 2000 12/15/18 2107 12/16/18 0449  BP: 113/64  (!) 159/57 122/81  Pulse: 75 72 75 61  Resp: 16 16 18 20   Temp: 98 F (36.7 C)  97.8 F (36.6 C) 97.8 F (36.6 C)  TempSrc: Oral  Oral Oral  SpO2: 98% 96% 95% 92%  Weight:      Height:        Intake/Output Summary (Last 24 hours) at 12/16/2018 0920 Last data filed at 12/16/2018 0900 Gross per 24 hour  Intake 603 ml  Output 2050 ml  Net -1447 ml   Filed Weights   12/07/18 0613 12/08/18 0509 12/09/18 0600  Weight: (!) 144.6 kg (!) 143.2 kg (!) 146.3 kg    Examination:  General exam: Appears calm and comfortable  Respiratory system: No increased work of breathing, anterior lung sounds clear, no wheezes, crackles, rhonchi,  diminished lung sounds at bases Cardiovascular system: Regular rate and rhythm, no murmurs Gastrointestinal system: Soft, nondistended, no rebound or guarding, plus bowel sounds. Central nervous system: Alert and oriented.  Bilateral lower extremity strength is 5 out of 5, upper cavity strength is 5 out of 5 Extremities: 1+ lower extremity edema Skin: No rashes over visible skin Psychiatry: Judgement and insight appear normal. Mood & affect appropriate.  Mildly slowed    Data Reviewed: I have personally reviewed following labs and imaging studies  CBC: Recent Labs  Lab 12/11/18 0459 12/12/18 0427 12/13/18 0539 12/15/18 0428 12/16/18 0347  WBC 5.6 6.4 7.2 7.7 7.0  HGB 10.2* 9.7* 10.6* 10.9* 11.3*  HCT 34.2* 31.7* 34.2* 35.7* 37.5*  MCV 101.8* 100.0 100.9* 100.8* 101.4*  PLT 70* 71* 72* 75* 70*   Basic Metabolic Panel: Recent Labs  Lab 12/10/18 0530 12/13/18 0539 12/14/18 0411 12/15/18 0428 12/16/18 0347  NA 138 135 132* 137 137  K 5.0 5.2* 5.5* 4.9 5.2*  CL 110 104 101 102 100  CO2 20* 22 23 28 29   GLUCOSE 210* 300* 233* 213* 206*  BUN 31* 36* 37* 35* 37*  CREATININE 0.98 0.98 0.82 0.72 0.77  CALCIUM 9.4 9.7 9.3 9.4 9.3   GFR: Estimated Creatinine Clearance: 116.5 mL/min (by C-G formula based on SCr of 0.77 mg/dL). Liver Function Tests: No results for input(s): AST, ALT, ALKPHOS, BILITOT, PROT, ALBUMIN in the last 168 hours. No results for input(s): LIPASE, AMYLASE in the last 168 hours. No results for input(s): AMMONIA in the last 168 hours. Coagulation Profile: No results  for input(s): INR, PROTIME in the last 168 hours. Cardiac Enzymes: No results for input(s): CKTOTAL, CKMB, CKMBINDEX, TROPONINI in the last 168 hours. BNP (last 3 results) No results for input(s): PROBNP in the last 8760 hours. HbA1C: No results for input(s): HGBA1C in the last 72 hours. CBG: Recent Labs  Lab 12/15/18 1654 12/15/18 2103 12/15/18 2359 12/16/18 0444 12/16/18 0735   GLUCAP 215* 242* 218* 212* 158*   Lipid Profile: No results for input(s): CHOL, HDL, LDLCALC, TRIG, CHOLHDL, LDLDIRECT in the last 72 hours. Thyroid Function Tests: No results for input(s): TSH, T4TOTAL, FREET4, T3FREE, THYROIDAB in the last 72 hours. Anemia Panel: No results for input(s): VITAMINB12, FOLATE, FERRITIN, TIBC, IRON, RETICCTPCT in the last 72 hours. Sepsis Labs: No results for input(s): PROCALCITON, LATICACIDVEN in the last 168 hours.  No results found for this or any previous visit (from the past 240 hour(s)).       Radiology Studies: Dg Chest Port 1 View  Result Date: 12/15/2018 CLINICAL DATA:  New onset shortness of breath, history type II diabetes mellitus, hypertension, atrial fibrillation, coronary artery disease, ischemic cardiomyopathy post MI EXAM: PORTABLE CHEST 1 VIEW COMPARISON:  Portable exam 0901 hours compared to 12/03/2018 FINDINGS: Left subclavian sequential transvenous ICD leads project at right atrium and right ventricle. Enlargement of cardiac silhouette post CABG. Pulmonary vascular congestion. Survey aorta BILATERAL interstitial infiltrates favoring pulmonary edema. Associated bibasilar atelectasis. No pleural effusion or pneumothorax. Osseous demineralization with chronic RIGHT rotator cuff tear. IMPRESSION: Enlargement of cardiac silhouette post CABG and ICD with pulmonary vascular congestion and probable mild pulmonary edema. Bibasilar atelectasis. Electronically Signed   By: Lavonia Dana M.D.   On: 12/15/2018 09:44        Scheduled Meds: . calcitonin (salmon)  1 spray Alternating Nares Daily  . carvedilol  6.25 mg Oral BID WC  . dexamethasone  8 mg Oral Daily  . feeding supplement (GLUCERNA SHAKE)  237 mL Oral TID BM  . feeding supplement (PRO-STAT SUGAR FREE 64)  30 mL Oral BID  . fentaNYL  1 patch Transdermal Q72H  . gabapentin  300 mg Oral TID  . insulin aspart  0-9 Units Subcutaneous TID WC  . insulin aspart  10 Units Subcutaneous TID WC   . insulin glargine  20 Units Subcutaneous BID  . lidocaine  1 patch Transdermal Q24H  . multivitamin with minerals  1 tablet Oral Daily  . nystatin  5 mL Oral 6 X Daily  . polyethylene glycol  17 g Oral BID  . rivaroxaban  20 mg Oral Q supper  . senna-docusate  2 tablet Oral BID  . sodium bicarbonate/sodium chloride   Mouth Rinse QID  . sodium chloride flush  3 mL Intravenous Q12H  . sodium polystyrene  45 g Oral Once   Continuous Infusions:   LOS: 13 days    Time spent: [place], MD [facility] If 7PM-7AM, please contact night-coverage [email or web address] Password [email or web address] 12/16/2018, 9:20 AM

## 2018-12-16 NOTE — Progress Notes (Signed)
Robert Gregory is slowly making progress.  He did do some physical therapy yesterday.  He did go walking.  This I think is a real improvement for him.  I know it has been slow going.  Radiation is going pretty well.  He is eating a little bit better.  He says his appetite is improving.  He is had no problems with bowels or bladder.  His right lower leg where he has the cellulitis seems to be improving.  His labs today show a white count of 7.  Hemoglobin 11.3.  Platelet count 70,000.  His BUN is 37 and creatinine 0.77.  His blood sugars may not be as high.  I still think that while he is in the hospital, we should get a Port-A-Cath placed.  I know that he is on Xarelto.  We go his get him off Xarelto and on Lovenox for a couple days.  His vital signs show temperature 97.8.  Pulse 61.  Blood pressure 122/81.  Oral exam shows no thrush.  His lungs are relatively clear bilaterally.  Cardiac exam regular rate and rhythm with no murmurs, rubs or bruits.  Abdomen is morbidly obese.  He has good bowel sounds.  Extremities shows the dressing on the right lower leg.  I really do not see much in the way of edema.  Neurological exam is nonfocal.  He will continue his radiation therapy next week.  He will have 5 more days I think.  Afterwards, he might need to go to skilled nursing.  He clearly will need a lot of rehab.  He is very appreciative and very impressed with the staff on [address].  He is very grateful for the wonderful care that they are giving him.  Lattie Haw, MD  [name] 6:9

## 2018-12-16 NOTE — Progress Notes (Signed)
PHARMACIST - PHYSICIAN ORDER COMMUNICATION  CONCERNING: P&T Medication Policy on Herbal Medications  DESCRIPTION:  This patient's order for:  Peppermint, CoEnzyme Q10, & Glucosamine  have been noted.  These product(s) are classified as an "herbal" or natural product. Due to a lack of definitive safety studies or FDA approval, nonstandard manufacturing practices, plus the potential risk of unknown drug-drug interactions while on inpatient medications, the Pharmacy and Therapeutics Committee does not permit the use of "herbal" or natural products of this type within [facility].   ACTION TAKEN: The pharmacy department is unable to verify this order at this time and your patient has been informed of this safety policy. Please reevaluate patient's clinical condition at discharge and address if the herbal or natural product(s) should be resumed at that time.  Minda Ditto PharmD Pager [phone] 12/16/2018, 9:54 AM

## 2018-12-16 NOTE — Progress Notes (Signed)
PT Cancellation Note  Patient Details Name: Robert Gregory MRN: [id number] DOB: 1941-09-19   Cancelled Treatment:     PT attempted x 3; Pt refused x 3 with various vague excuses.  Will follow.   [name] 12/16/2018, 3:35 PM

## 2018-12-17 ENCOUNTER — Inpatient Hospital Stay (HOSPITAL_COMMUNITY): Payer: Medicare Other

## 2018-12-17 LAB — BASIC METABOLIC PANEL
Anion gap: 9 (ref 5–15)
BUN: 39 mg/dL — ABNORMAL HIGH (ref 8–23)
CO2: 33 mmol/L — ABNORMAL HIGH (ref 22–32)
Calcium: 9.4 mg/dL (ref 8.9–10.3)
Chloride: 97 mmol/L — ABNORMAL LOW (ref 98–111)
Creatinine, Ser: 0.92 mg/dL (ref 0.61–1.24)
GFR calc non Af Amer: >60 mL/min (ref >60)
Glucose, Bld: 241 mg/dL — ABNORMAL HIGH (ref 70–99)
Potassium: 4.9 mmol/L (ref 3.5–5.1)
Sodium: 139 mmol/L (ref 135–145)

## 2018-12-17 LAB — GLUCOSE, CAPILLARY
GLUCOSE-CAPILLARY: 194 mg/dL — AB (ref 70–99)
Glucose-Capillary: 148 mg/dL — ABNORMAL HIGH (ref 70–99)
Glucose-Capillary: 221 mg/dL — ABNORMAL HIGH (ref 70–99)
Glucose-Capillary: 249 mg/dL — ABNORMAL HIGH (ref 70–99)
Glucose-Capillary: 285 mg/dL — ABNORMAL HIGH (ref 70–99)
Glucose-Capillary: 292 mg/dL — ABNORMAL HIGH (ref 70–99)

## 2018-12-17 LAB — BASIC METABOLIC PANEL WITH GFR: GFR calc Af Amer: >60 mL/min (ref >60)

## 2018-12-17 MED ORDER — TRAMADOL HCL 50 MG PO TABS
25.0000 mg | ORAL_TABLET | Freq: Four times a day (QID) | ORAL | Status: DC | PRN
  Administered 2018-12-18 – 2018-12-20 (×2): 50 mg via ORAL
  Administered 2018-12-22: 12:00:00 25 mg via ORAL
  Administered 2018-12-23: 02:00:00 50 mg via ORAL
  Filled 2018-12-17 (×6): qty 1

## 2018-12-17 MED ORDER — GABAPENTIN 400 MG PO CAPS
400.0000 mg | ORAL_CAPSULE | Freq: Three times a day (TID) | ORAL | Status: DC
  Administered 2018-12-17 – 2018-12-28 (×33): 400 mg via ORAL
  Filled 2018-12-17 (×34): qty 1

## 2018-12-17 NOTE — Progress Notes (Signed)
This RN and tech were helping pt back to bed from [facility] and approx. 30 seconds after standing, pts legs gave out and pt was guided back on BSC. Pt was dizzy, lightheaded, and confused, unaware of where he was. Vital signs were immediately obtained and were stable. CBG was 221. Pt had no c/o pain. Pt stated "he felt an odd sensation" before it happened. Pt began twitching more throughout his body after this occurrence. Pt was placed back in bed and placed back on tele. An EKG was performed. Rapid response was called to evaluate pt and she ordered a head CT. Within 30 min after occurrence, pt stated he felt less confused and more like himself. On call NP was notified of the incident. Pt is resting comfortably in bed currently and has no complaints or concerns. Will monitor the pt closely.

## 2018-12-17 NOTE — Plan of Care (Signed)
Patient up to chair and BSC with one assist with lots of encouragement.  No prn pain meds given this shift.

## 2018-12-17 NOTE — Progress Notes (Signed)
Pt up with 1x assist to [facility] multiple times today. Also to recliner for supper. When getting out of bed for supper, pt stated "wow, no pain." Pt also on room air for majority of my shift, only wearing O2 to catch breath after getting out of bed.

## 2018-12-18 ENCOUNTER — Ambulatory Visit
Admit: 2018-12-18 | Discharge: 2018-12-18 | Disposition: A | Discharge status: Home / Self Care | Payer: Medicare Other | Attending: Radiation Oncology | Admitting: Radiation Oncology

## 2018-12-18 ENCOUNTER — Ambulatory Visit: Payer: Medicare Other

## 2018-12-18 LAB — GLUCOSE, CAPILLARY
GLUCOSE-CAPILLARY: 171 mg/dL — AB (ref 70–99)
Glucose-Capillary: 141 mg/dL — ABNORMAL HIGH (ref 70–99)
Glucose-Capillary: 149 mg/dL — ABNORMAL HIGH (ref 70–99)
Glucose-Capillary: 168 mg/dL — ABNORMAL HIGH (ref 70–99)
Glucose-Capillary: 229 mg/dL — ABNORMAL HIGH (ref 70–99)
Glucose-Capillary: 307 mg/dL — ABNORMAL HIGH (ref 70–99)

## 2018-12-18 LAB — CBC WITH DIFFERENTIAL/PLATELET
Abs Immature Granulocytes: 0.04 10*3/uL (ref 0.00–0.07)
Basophils Absolute: 0 10*3/uL (ref 0.0–0.1)
Basophils Relative: 0 %
Eosinophils Absolute: 0 10*3/uL (ref 0.0–0.5)
Eosinophils Relative: 1 %
HCT: 36.5 % — ABNORMAL LOW (ref 39.0–52.0)
Hemoglobin: 11.5 g/dL — ABNORMAL LOW (ref 13.0–17.0)
Immature Granulocytes: 1 %
Lymphocytes Relative: 7 %
Lymphs Abs: 0.5 10*3/uL — ABNORMAL LOW (ref 0.7–4.0)
MCH: 31.3 pg (ref 26.0–34.0)
MCHC: 31.5 g/dL (ref 30.0–36.0)
MCV: 99.2 fL (ref 80.0–100.0)
Monocytes Absolute: 0.8 10*3/uL (ref 0.1–1.0)
Monocytes Relative: 11 %
Neutro Abs: 5.5 10*3/uL (ref 1.7–7.7)
Neutrophils Relative %: 80 %
Platelets: 79 10*3/uL — ABNORMAL LOW (ref 150–400)
RBC: 3.68 MIL/uL — ABNORMAL LOW (ref 4.22–5.81)
RDW: 20 % — ABNORMAL HIGH (ref 11.5–15.5)
WBC: 6.9 10*3/uL (ref 4.0–10.5)
nRBC: 0.3 % — ABNORMAL HIGH (ref 0.0–0.2)

## 2018-12-18 LAB — BASIC METABOLIC PANEL
Anion gap: 11 (ref 5–15)
BUN: 42 mg/dL — ABNORMAL HIGH (ref 8–23)
CO2: 32 mmol/L (ref 22–32)
Calcium: 9.2 mg/dL (ref 8.9–10.3)
Chloride: 95 mmol/L — ABNORMAL LOW (ref 98–111)
Creatinine, Ser: 0.98 mg/dL (ref 0.61–1.24)
GFR calc Af Amer: >60 mL/min (ref >60)
GFR calc non Af Amer: >60 mL/min (ref >60)
Potassium: 4.2 mmol/L (ref 3.5–5.1)
Sodium: 138 mmol/L (ref 135–145)

## 2018-12-18 LAB — COMPREHENSIVE METABOLIC PANEL
ALK PHOS: 277 U/L — AB (ref 38–126)
ALT: 171 U/L — ABNORMAL HIGH (ref 0–44)
AST: 131 U/L — ABNORMAL HIGH (ref 15–41)
Albumin: 3.5 g/dL (ref 3.5–5.0)
Anion gap: 11 (ref 5–15)
BILIRUBIN TOTAL: 1.5 mg/dL — AB (ref 0.3–1.2)
BUN: 41 mg/dL — ABNORMAL HIGH (ref 8–23)
CO2: 31 mmol/L (ref 22–32)
CREATININE: 0.95 mg/dL (ref 0.61–1.24)
Calcium: 9 mg/dL (ref 8.9–10.3)
Chloride: 95 mmol/L — ABNORMAL LOW (ref 98–111)
GFR calc Af Amer: >60 mL/min (ref >60)
GFR calc non Af Amer: >60 mL/min (ref >60)
Glucose, Bld: 188 mg/dL — ABNORMAL HIGH (ref 70–99)
Potassium: 4.2 mmol/L (ref 3.5–5.1)
Sodium: 137 mmol/L (ref 135–145)
TOTAL PROTEIN: 6.3 g/dL — AB (ref 6.5–8.1)

## 2018-12-18 LAB — MAGNESIUM: Magnesium: 2.3 mg/dL (ref 1.7–2.4)

## 2018-12-18 LAB — BASIC METABOLIC PANEL WITH GFR: Glucose, Bld: 194 mg/dL — ABNORMAL HIGH (ref 70–99)

## 2018-12-18 MED ORDER — ENOXAPARIN SODIUM 150 MG/ML ~~LOC~~ SOLN: 1.0000 mg/kg | Freq: Two times a day (BID) | SUBCUTANEOUS | Status: DC

## 2018-12-18 MED ORDER — ENOXAPARIN SODIUM 150 MG/ML ~~LOC~~ SOLN
1.0000 mg/kg | Freq: Two times a day (BID) | SUBCUTANEOUS | Status: DC
  Administered 2018-12-19: 145 mg via SUBCUTANEOUS
  Filled 2018-12-18 (×2): qty 0.98

## 2018-12-18 NOTE — Progress Notes (Signed)
Request to IR for port placement - reviewed by Dr. Earleen Newport this morning who approves procedure. Last dose of Xarelto evening of 3/29, this has already been discontinued and Lovenox started by [name] this morning.   Will tentatively plan for port placement in IR on 3/31 or 4/1. Full consult note/consent signing will be done in IR on day of procedure. Will place orders for NPO after midnight on 3/31 for possible procedure - if this is delayed I will restart diet. Will order INR for pre-procedure labs.   Please call with questions or concerns.  Candiss Norse, PA-C Pager# [phone]

## 2018-12-18 NOTE — Progress Notes (Signed)
Inpatient Rehabilitation-Admissions Coordinator   Met with pt at the bedside this AM to continue to assess appropriateness for CIR. AC encouraged active participation in all therapy sessions. AC discussed participation is needed to help determine if he can tolerate the intensity of CIR as well as to determine if he still needs an IP rehab stay. Pt verbalized understanding. Pt requested I speak with his wife to update her on our conversation.   AC will continue to follow along.   Robert Gregory, OTR/L  Rehab Admissions Coordinator  [phone] 12/18/2018 8:32 AM

## 2018-12-18 NOTE — Progress Notes (Signed)
ANTICOAGULATION CONSULT NOTE - Initial Consult  Pharmacy Consult for Lovenox Indication: atrial fibrillation  Allergies  Allergen Reactions  . Feraheme [Ferumoxytol] Other (See Comments)    Patient had  reaction during infusion in Feb 2020 decision made to change to [facility] (passed out)  . Contrast Media [Iodinated Diagnostic Agents] Rash and Other (See Comments)    Extreme flushing  . Iodine Other (See Comments)    flushing  . Ioxaglate Other (See Comments)    Extreme flushing  . Adhesive [Tape] Rash    Adhesive tapes causes redness/itching/rash after removal  . Seasonal Ic [Cholestatin] Other (See Comments)    Unknown reaction    Patient Measurements: Height: 6' 1"  (185.4 cm) Weight: (!) 322 lb 8.5 oz (146.3 kg) IBW/kg (Calculated) : 79.9   Vital Signs: Temp: 97.7 F (36.5 C) (03/30 0644) Temp Source: Oral (03/30 0644) BP: 121/70 (03/30 0644) Pulse Rate: 74 (03/30 0644)  Labs: Recent Labs    12/16/18 0347 12/17/18 0511 12/18/18 0523  HGB 11.3*  --   --   HCT 37.5*  --   --   PLT 70*  --   --   CREATININE 0.77 0.92 0.95  0.98    Estimated Creatinine Clearance: 95.1 mL/min (by C-G formula based on SCr of 0.98 mg/dL).   Medical History: Past Medical History:  Diagnosis Date  . Arthritis   . Atrial fibrillation ([place])    takes Xarelto daily  . Cellulitis    RLE  . Cholelithiasis   . Chronic back pain    scoliosis/arthritis  . Complication of anesthesia    forgetful for about 2-3wks after anesthesia  . Hepatocellular carcinoma ([place])   . History of colon polyps   . Hyperlipidemia    takes [employer] daily  . Hypertension    takes Benicar and Coreg daily  . ICD (implantable cardioverter-defibrillator) in place 11/07/2013  . Iron deficiency anemia due to chronic blood loss 08/24/2018  . Iron malabsorption 08/24/2018  . Ischemic cardiomyopathy   . Myocardial infarction ([place]) 1997   on the OR table   . Nephrolithiasis   .  Obstructive sleep apnea    CPAP  . Peripheral neuropathy   . Pernicious anemia 08/24/2018  . Spinal stenosis   . Thrombocytopenia ([place])   . Total knee replacement status 11/29/2014  . Type 2 diabetes mellitus with stage 3 chronic kidney disease ([place]) 05/19/2015    Medications:  Xarelto 20 mg daily  Assessment: 78 y/o M with a h/o metastatic hepatocellular carcinoma and atrial fibrillation on Xarelto transferred to [facility] for radiation therapy. Patient will need to transition to Lovenox in anticipation of [facility] placement tentatively scheduled for Wednesday.   Today 12/18/18  - plt low but stable (last 3/28), CBC pending - no bleeding reported - last Xarelto dose 3/29 @ 1615 - SCr 0.98, rising but relatively stable  Goal of Therapy:  Anti-Xa level 0.6-1 units/ml 4hrs after LMWH dose given Monitor platelets by anticoagulation protocol: Yes   Plan:  Will begin Lovenox 1 mg/kg q 12 hours tonight. Will need to follow up planned procedure time to decide when to hold Lovenox.   Ulice Dash D 12/18/2018,7:51 AM

## 2018-12-18 NOTE — Progress Notes (Signed)
PROGRESS NOTE    Robert Gregory  HTD:[id number] DOB: 1941-05-01 DOA: 12/03/2018 PCP: Seward Carol, MD   Brief Narrative:  78 year old with past medical history relevant for coronary artery disease status post CABG, status post pacemaker placement, atrial fibrillation on rivaroxaban, stage III CKD, obstructive sleep apnea, type 2 diabetes, [name] cirrhosis, recent diagnosis of metastatic hepatocellular carcinoma with disease to thoracic spine, hypertension admitted with near syncope and found to be in complete heart block status post pacemaker adjustment.  His hospital course has been complicated by bilateral lower extremity cellulitis, AKI on CKD complicated by hyperkalemia, dysphasia found to have esophageal dysmotility, recalcitrant back pain due to T7 pathologic fracture transferred to [facility] on 12/10/2018 for initiation of radiation therapy.   Assessment & Plan:   Principal Problem:   Near syncope Active Problems:   Essential hypertension   Hyperlipidemia   Type 2 diabetes mellitus with stage 3 chronic kidney disease (HCC)   Chronic back pain   Obesity, Class III, BMI 40-49.9 (morbid obesity) (HCC)   Cellulitis of right lower extremity   Complete heart block (HCC)   Acute renal failure superimposed on stage 3 chronic kidney disease ([place])   Hepatocellular carcinoma ([place])  #) Acute encephalopathy due to medication: Resolved patient was noted to be confused and weak on 12/16/2018. And complained of vague nonspecific neurological symptomatology.  Stat head CT was performed.  Unremarkable patient returned to baseline. -Will review patient's pain medications however delicate balancing act between controlling the patient's pain and his sensitivity especially to opioid analgesics  #) Metastatic hepatocellular carcinoma/recalcitrant T7 pain due to pathologic fracture: Patient has been intermittently refusing certain pain medications as he is quite sensitive to them. -Discontinued oral  oxycodone 2.5 to 5 mg as needed -Start tramadol 25 to 50 mg every 6 hours as needed -Patient is refused very rarely intermittently used NSAIDs in the past, will discuss with patient again -Continue fentanyl patch 25 mcg every 72 hours -Continue lidocaine patch -Increase gabapentin 400 mg 3 times daily -Oncology following, [name] -Oncology started steroids as well as given 1 dose of zoledronic acid and calcitonin -Radiation oncology following, treatment started on 12/11/2018 -Neurosurgery did not feel that there was any surgical options for the patient due to high risk of decompensation  #) Acute on chronic diastolic heart failure: Patient has an echo on 09/08/2018 that showed a normal ejection fraction but could not evaluate diastolic function.  This appears to have improved with starting IV diuresis.  Would continue wanted more additional day of IV diuresis. -Hold home oral furosemide -Continue IV furosemide 40 mg twice daily, transition to once a day tomorrow and oral day after  #) Paroxysmal atrial fibrillation: -Continue rivaroxaban, oncology will discuss with general surgery about transitioning patient to enoxaparin subcu and possibly receiving port while inpatient. -Continue beta-blocker  #) Type 2 diabetes complicated by neuropathy: Blood sugars have increased on steroids -Continue glargine 20 units twice daily -Continue aspart 10 units with meals 3 times daily -Patient is on U500 at home -Sliding scale insulin, AC at bedtime -Carb restricted diet  #) Bilateral lower extremity cellulitis: This appears to have largely resolved with a course of IV and p.o. antibiotics  #) Dysphagia: -Swallow study showed evidence of nonspecific esophageal dysmotility -Gastroenterology has signed off  #) Complete heart block status post pacemaker/near syncope: Patient initially presented with near syncope and was found to be in complete heart block.  Patient did have pacemaker changed from VVI  to DDD -Continue telemetry -  Cardiology signed off, appreciate recommendations  #) AKI on CKD stage III/hyperkalemia: This is resolved with IV fluids. -Avoid nephrotoxins  #) Coronary artery disease status post CABG/hypertension/hyperlipidemia: -Continue carvedilol 6.25 mg twice daily -Continue ARB  #) OSA: -Continue CPAP nightly  Fluids: Tolerating p.o. Electrolytes: Monitor and supplement Nutrition: Carb restricted diet  Disposition: Pending discharge to skilled nursing facility  DO NOT RESUSCITATE   Consultants:   Neurosurgery  Cardiology  Oncology, [name]  Radiation oncology  Gastroenterology, Sadie Haber  Physical medicine rehabilitation  Procedures:   Pacemaker transition from VVI to DDD pacing on 12/03/2018  Esophagram 12/05/2018 nonspecific esophageal motility disorder  Antimicrobials:   P.o. cephalexin started 12/08/2018 to 12/12/2018  IV cefazolin 12/03/2020 to 12/08/2018  IV vancomycin and Zosyn 12/03/2018 to 12/04/2018   Subjective: No acute complaint.  No nausea or vomiting.  Pain is still present but the medications are working.  No diarrhea reported by patient.  Objective: Vitals:   12/17/18 0422 12/17/18 0445 12/17/18 2049 12/18/18 0644  BP: 132/81 123/72 118/61 121/70  Pulse:   83 74  Resp:   18 18  Temp:   98.2 F (36.8 C) 97.7 F (36.5 C)  TempSrc:   Oral Oral  SpO2:   99% 94%  Weight:      Height:        Intake/Output Summary (Last 24 hours) at 12/18/2018 1055 Last data filed at 12/18/2018 0700 Gross per 24 hour  Intake -  Output 400 ml  Net -400 ml   Filed Weights   12/07/18 0613 12/08/18 0509 12/09/18 0600  Weight: (!) 144.6 kg (!) 143.2 kg (!) 146.3 kg    Examination:  General exam: Appears calm and comfortable  Respiratory system: No increased work of breathing, anterior lung sounds clear, no wheezes, crackles, rhonchi, diminished lung sounds at bases Cardiovascular system: Regular rate and rhythm, no murmurs  Gastrointestinal system: Soft, nondistended, no rebound or guarding, plus bowel sounds. Central nervous system: Alert and oriented.  Bilateral lower extremity strength is 5 out of 5, upper cavity strength is 5 out of 5 Extremities: 1+ lower extremity edema Skin: No rashes over visible skin Psychiatry: Judgement and insight appear normal. Mood & affect appropriate.  Mildly slowed    Data Reviewed: I have personally reviewed following labs and imaging studies  CBC: Recent Labs  Lab 12/12/18 0427 12/13/18 0539 12/15/18 0428 12/16/18 0347 12/18/18 0523  WBC 6.4 7.2 7.7 7.0 6.9  NEUTROABS  --   --   --   --  5.5  HGB 9.7* 10.6* 10.9* 11.3* 11.5*  HCT 31.7* 34.2* 35.7* 37.5* 36.5*  MCV 100.0 100.9* 100.8* 101.4* 99.2  PLT 71* 72* 75* 70* 79*   Basic Metabolic Panel: Recent Labs  Lab 12/14/18 0411 12/15/18 0428 12/16/18 0347 12/17/18 0511 12/18/18 0523  NA 132* 137 137 139 137  138  K 5.5* 4.9 5.2* 4.9 4.2  4.2  CL 101 102 100 97* 95*  95*  CO2 23 28 29  33* 31  32  GLUCOSE 233* 213* 206* 241* 188*  194*  BUN 37* 35* 37* 39* 41*  42*  CREATININE 0.82 0.72 0.77 0.92 0.95  0.98  CALCIUM 9.3 9.4 9.3 9.4 9.0  9.2  MG  --   --   --   --  2.3   GFR: Estimated Creatinine Clearance: 95.1 mL/min (by C-G formula based on SCr of 0.98 mg/dL). Liver Function Tests: Recent Labs  Lab 12/18/18 0523  AST 131*  ALT 171*  ALKPHOS 277*  BILITOT 1.5*  PROT 6.3*  ALBUMIN 3.5   No results for input(s): LIPASE, AMYLASE in the last 168 hours. No results for input(s): AMMONIA in the last 168 hours. Coagulation Profile: No results for input(s): INR, PROTIME in the last 168 hours. Cardiac Enzymes: No results for input(s): CKTOTAL, CKMB, CKMBINDEX, TROPONINI in the last 168 hours. BNP (last 3 results) No results for input(s): PROBNP in the last 8760 hours. HbA1C: No results for input(s): HGBA1C in the last 72 hours. CBG: Recent Labs  Lab 12/17/18 1556 12/17/18 2050 12/18/18  0042 12/18/18 0606 12/18/18 0740  GLUCAP 249* 292* 229* 171* 141*   Lipid Profile: No results for input(s): CHOL, HDL, LDLCALC, TRIG, CHOLHDL, LDLDIRECT in the last 72 hours. Thyroid Function Tests: No results for input(s): TSH, T4TOTAL, FREET4, T3FREE, THYROIDAB in the last 72 hours. Anemia Panel: No results for input(s): VITAMINB12, FOLATE, FERRITIN, TIBC, IRON, RETICCTPCT in the last 72 hours. Sepsis Labs: No results for input(s): PROCALCITON, LATICACIDVEN in the last 168 hours.  No results found for this or any previous visit (from the past 240 hour(s)).       Radiology Studies: Ct Head Wo Contrast  Result Date: 12/17/2018 CLINICAL DATA:  Syncope EXAM: CT HEAD WITHOUT CONTRAST TECHNIQUE: Contiguous axial images were obtained from the base of the skull through the vertex without intravenous contrast. COMPARISON:  11/14/2018 FINDINGS: Brain: No evidence of acute infarction, hemorrhage, hydrocephalus, extra-axial collection or mass lesion/mass effect. Mild cortical atrophy. Mild subcortical white matter and periventricular small vessel ischemic changes. Vascular: Intracranial atherosclerosis. Skull: Normal. Negative for fracture or focal lesion. Sinuses/Orbits: The visualized paranasal sinuses are essentially clear. The mastoid air cells are unopacified. Other: None. IMPRESSION: No evidence of acute intracranial abnormality. Atrophy with small vessel ischemic changes. Electronically Signed   By: Julian Hy M.D.   On: 12/17/2018 05:32        Scheduled Meds: . calcitonin (salmon)  1 spray Alternating Nares Daily  . carvedilol  6.25 mg Oral BID WC  . cholecalciferol  2,000 Units Oral Q supper  . dexamethasone  8 mg Oral Daily  . [START ON 12/19/2018] enoxaparin (LOVENOX) injection  1 mg/kg Subcutaneous BID  . ezetimibe  10 mg Oral Daily  . feeding supplement (GLUCERNA SHAKE)  237 mL Oral TID BM  . feeding supplement (PRO-STAT SUGAR FREE 64)  30 mL Oral BID  . fenofibrate   160 mg Oral Daily  . fentaNYL  1 patch Transdermal Q72H  . furosemide  40 mg Intravenous BID  . gabapentin  400 mg Oral TID  . insulin aspart  0-9 Units Subcutaneous TID WC  . insulin aspart  10 Units Subcutaneous TID WC  . insulin glargine  20 Units Subcutaneous BID  . irbesartan  300 mg Oral Daily  . lidocaine  1 patch Transdermal Q24H  . multivitamin with minerals  1 tablet Oral Daily  . nystatin  5 mL Oral 6 X Daily  . omega-3 acid ethyl esters  2 g Oral BID  . polyethylene glycol  17 g Oral BID  . pravastatin  80 mg Oral QHS  . saccharomyces boulardii  250 mg Oral Daily  . senna-docusate  2 tablet Oral BID  . sodium bicarbonate/sodium chloride   Mouth Rinse QID  . sodium chloride flush  3 mL Intravenous Q12H  . spironolactone  25 mg Oral Daily   Continuous Infusions:   LOS: 15 days    Time spent: [address]  MD [facility] If 7PM-7AM, please contact night-coverage [email or web address] Password [email or web address] 12/18/2018, 10:55 AM

## 2018-12-18 NOTE — Progress Notes (Signed)
Robert Gregory is making some progress.  He was able to get out of bed yesterday and sit in a recliner.  He had dinner in the recliner.  This really was much better tolerated than he would have thought.  Hopefully this is a good sign for him.  He will finish up radiation at the end of this week.  Hopefully, he will be able to go home.  We talked quite a bit about what will happen after he gets out of the hospital.  I still plan to try systemic therapy for his liver cancer.  There is the new trial that show that combination of immunotherapy and anti-angiogenesis therapy is actually better than first-line therapy with Nexavar.  I think he could tolerate this.  However, he will need to have a Port-A-Cath placed.  He has had no problems with nausea or vomiting.  He has had no diarrhea.  He has had no fever.  His labs still look okay.  Blood sugars have been on the high side because of Decadron.  If we are going to get a Port-A-Cath into him, I would stop the Xarelto and put him on Lovenox.  I would then suspect that the Port-A-Cath can be placed on Wednesday.  He has had no bleeding.  His right lower leg seems to be doing pretty well.  His vital signs look stable.  Temperature 97.7.  Pulse 74.  Blood pressure 121/70.  His lungs sound relatively clear.  Cardiac exam regular rate and rhythm.  He has a pacemaker in.  Oral exam shows no thrush.  Abdomen is obese.  Bowel sounds are present.  There is no guarding or rebound tenderness.  Extremities shows no clubbing, cyanosis or edema.  He has a dressing on the right lower leg.  Neurological exam is nonfocal.  Robert Gregory will continue his radiation therapy for the metastatic hepatocellular carcinoma.  He has the pathologic fracture at T7.  He is on steroids for the radiation.  I may cut this back a little bit.  We will see about getting things arranged for the Port-A-Cath.  The staff on [address] are doing a fantastic job with him.   Robert Haw, MD  Robert Gregory  1:37

## 2018-12-18 NOTE — Progress Notes (Signed)
Physical Therapy Treatment Patient Details Name: Robert Gregory MRN: [id number] DOB: 13-Apr-1941 Today's Date: 12/18/2018    History of Present Illness Pt is a 78 y/o male admitted 12/03/18 with dizziness, bradycardia and near syncope. Pt with complete heart block and metastatic hepatocellular carinoma with T7 compression fx. Not a candidate for verterbroplasty, to begin palliative radiation at [facility] 3/20. PMH includes DM, MI, ICD, HTN, HLD, A-fib.    PT Comments    Pt progressing well; incr amb distance today (240') and requiring decr assist overall; will continue to work with pt--he may be able to d/c home with HHPT if progress continues. Pt has 4 more XRT treatments.  Follow Up Recommendations  SNF;Home health PT(depending on progress)     Equipment Recommendations  Hospital bed(?)    Recommendations for Other Services       Precautions / Restrictions Precautions Precautions: Fall;Back Precaution Booklet Issued: No Precaution Comments: pt opting against use of brace (for comfort) - pt states MD stated this was okay. Reviewed back precautions with pt this session to protect spine (no bending, twisting, arching spine; no lifting). Watch sats with mobility Required Braces or Orthoses: Spinal Brace Spinal Brace: Thoracolumbosacral orthotic(pt declines) Restrictions Weight Bearing Restrictions: No    Mobility  Bed Mobility Overal bed mobility: Needs Assistance Bed Mobility: Rolling Rolling: Min guard Sidelying to sit: Min assist     Sit to sidelying: Min assist General bed mobility comments: multi-modal cues for log roll  Transfers Overall transfer level: Needs assistance Equipment used: Rolling walker (2 wheeled) Transfers: Sit to/from Stand Sit to Stand: Min guard         General transfer comment: Min assist for initial power up.  Verbal cuing to use at least one UE to push from bed for stability and to reach back and control descent    Ambulation/Gait Ambulation/Gait assistance: Min guard Gait Distance (Feet): 240 Feet Assistive device: Rolling walker (2 wheeled) Gait Pattern/deviations: Step-through pattern;Decreased stride length;Trunk flexed     General Gait Details: verbal cues for safety especially with turns, pt with scisssoring, LOB x1 with min assist to recover   Stairs             Wheelchair Mobility    Modified Rankin (Stroke Patients Only)       Balance     Sitting balance-Leahy Scale: Good       Standing balance-Leahy Scale: Fair Standing balance comment: able to static stand without LOB                             Cognition Arousal/Alertness: Awake/alert Behavior During Therapy: WFL for tasks assessed/performed Overall Cognitive Status: Within Functional Limits for tasks assessed                                 General Comments: pt in good spirits and pleasant with PT.       Exercises      General Comments        Pertinent Vitals/Pain Pain Score: 4  Pain Location: back  Pain Descriptors / Indicators: Discomfort;Sore Pain Intervention(s): Limited activity within patient's tolerance;Monitored during session    Home Living                      Prior Function            PT Goals (current goals  can now be found in the care plan section) Acute Rehab PT Goals PT Goal Formulation: With patient/family Time For Goal Achievement: 12/19/18 Potential to Achieve Goals: Good Progress towards PT goals: Progressing toward goals    Frequency    Min 3X/week      PT Plan Discharge plan needs to be updated    Co-evaluation              AM-PAC PT "6 Clicks" Mobility   Outcome Measure  Help needed turning from your back to your side while in a flat bed without using bedrails?: A Little Help needed moving from lying on your back to sitting on the side of a flat bed without using bedrails?: A Little Help needed moving to and from  a bed to a chair (including a wheelchair)?: A Little Help needed standing up from a chair using your arms (e.g., wheelchair or bedside chair)?: A Little Help needed to walk in hospital room?: A Little Help needed climbing 3-5 steps with a railing? : A Lot 6 Click Score: 17    End of Session Equipment Utilized During Treatment: Gait belt Activity Tolerance: Patient tolerated treatment well Patient left: in bed;with call bell/phone within reach;with bed alarm set Nurse Communication: Mobility status PT Visit Diagnosis: Pain;Muscle weakness (generalized) (M62.81);Unsteadiness on feet (R26.81) Pain - part of body: (back)     Time: 1401-1420 PT Time Calculation (min) (ACUTE ONLY): 19 min  Charges:  $Gait Training: 8-22 mins                     Kenyon Ana, PT  Pager: [phone] Acute Rehab Dept [facility]): [phone]   12/18/2018    [facility] 12/18/2018, 5:12 PM

## 2018-12-18 NOTE — Progress Notes (Signed)
Occupational Therapy Treatment Patient Details Name: Robert Gregory MRN: [id number] DOB: Jan 27, 1941 Today's Date: 12/18/2018    History of present illness Pt is a 78 y/o male admitted 12/03/18 with dizziness, bradycardia and near syncope. Pt with complete heart block and metastatic hepatocellular carinoma with T7 compression fx. Not a candidate for verterbroplasty, to begin palliative radiation at [facility] 3/20. PMH includes DM, MI, ICD, HTN, HLD, A-fib.   OT comments  Pt with GREAT participation this day  Follow Up Recommendations  CIR;Supervision/Assistance - 24 hour    Equipment Recommendations  Other (comment)    Recommendations for Other Services      Precautions / Restrictions Precautions Precautions: Fall;Back Precaution Comments: pt opting against use of brace (for comfort) - pt states MD stated this was okay. Reviewed back precautions with pt this session to protect spine (no bending, twisting, arching spine; no lifting). Watch sats with mobility       Mobility Bed Mobility Overal bed mobility: Needs Assistance Bed Mobility: Rolling Rolling: Min assist Sidelying to sit: Min assist     Sit to sidelying: Mod assist    Transfers Overall transfer level: Needs assistance Equipment used: Rolling walker (2 wheeled) Transfers: Sit to/from [facility] Sit to Stand: Min assist Stand pivot transfers: Min assist            Balance             Standing balance-Leahy Scale: Poor Standing balance comment: relies on RW for support                            ADL either performed or assessed with clinical judgement   ADL Overall ADL's : Needs assistance/impaired Eating/Feeding: Independent;Sitting   Grooming: Sitting;Minimal assistance                   Toilet Transfer: Minimal assistance;+2 for safety/equipment Toilet Transfer Details (indicate cue type and reason): bed to chair Toileting- Clothing Manipulation and  Hygiene: Moderate assistance;Sit to/from stand;Cueing for sequencing;Cueing for safety         General ADL Comments: pt agreeable to OOB with OT this OT session!      Vision Patient Visual Report: No change from baseline            Cognition Arousal/Alertness: Awake/alert Behavior During Therapy: WFL for tasks assessed/performed Overall Cognitive Status: Within Functional Limits for tasks assessed                                                     Pertinent Vitals/ Pain       Pain Score: 4  Pain Location: back  Pain Descriptors / Indicators: Discomfort;Sore Pain Intervention(s): Limited activity within patient's tolerance;Repositioned;Monitored during session         Frequency           Progress Toward Goals  OT Goals(current goals can now be found in the care plan section)  Progress towards OT goals: Progressing toward goals     Plan Discharge plan remains appropriate       AM-PAC OT "6 Clicks" Daily Activity     Outcome Measure   Help from another person eating meals?: None Help from another person taking care of personal grooming?: A Lot Help from another person toileting, which includes using  toliet, bedpan, or urinal?: A Lot Help from another person bathing (including washing, rinsing, drying)?: A Lot Help from another person to put on and taking off regular upper body clothing?: A Lot Help from another person to put on and taking off regular lower body clothing?: A Lot 6 Click Score: 14    End of Session Equipment Utilized During Treatment: Gait belt;Rolling walker  OT Visit Diagnosis: Unsteadiness on feet (R26.81);Other abnormalities of gait and mobility (R26.89);Muscle weakness (generalized) (M62.81);Pain   Activity Tolerance Patient tolerated treatment well   Patient Left with call bell/phone within reach;in chair;with chair alarm set;with nursing/sitter in room(pt going for his radiation)   Nurse Communication Mobility  status        Time: 1102-1117 OT Time Calculation (min): 19 min  Charges: OT General Charges $OT Visit: 1 Visit OT Treatments $Self Care/Home Management : 8-22 mins  Kari Baars, [place] [phone] Office- [phone]      [place], Edwena Felty D 12/18/2018, 1:09 PM

## 2018-12-19 ENCOUNTER — Inpatient Hospital Stay (HOSPITAL_COMMUNITY): Payer: Medicare Other

## 2018-12-19 ENCOUNTER — Ambulatory Visit
Admit: 2018-12-19 | Discharge: 2018-12-19 | Disposition: A | Discharge status: Home / Self Care | Payer: Medicare Other | Attending: Radiation Oncology | Admitting: Radiation Oncology

## 2018-12-19 ENCOUNTER — Ambulatory Visit: Payer: Medicare Other

## 2018-12-19 ENCOUNTER — Encounter (HOSPITAL_COMMUNITY): Payer: Self-pay | Admitting: Interventional Radiology

## 2018-12-19 HISTORY — PX: IR IMAGING GUIDED PORT INSERTION: IMG5740

## 2018-12-19 LAB — GLUCOSE, CAPILLARY
GLUCOSE-CAPILLARY: 225 mg/dL — AB (ref 70–99)
Glucose-Capillary: 144 mg/dL — ABNORMAL HIGH (ref 70–99)
Glucose-Capillary: 163 mg/dL — ABNORMAL HIGH (ref 70–99)
Glucose-Capillary: 287 mg/dL — ABNORMAL HIGH (ref 70–99)
Glucose-Capillary: 306 mg/dL — ABNORMAL HIGH (ref 70–99)
Glucose-Capillary: 345 mg/dL — ABNORMAL HIGH (ref 70–99)

## 2018-12-19 LAB — BASIC METABOLIC PANEL
Anion gap: 10 (ref 5–15)
BUN: 41 mg/dL — ABNORMAL HIGH (ref 8–23)
CO2: 31 mmol/L (ref 22–32)
Calcium: 9.1 mg/dL (ref 8.9–10.3)
Chloride: 96 mmol/L — ABNORMAL LOW (ref 98–111)
Creatinine, Ser: 0.93 mg/dL (ref 0.61–1.24)
GFR calc Af Amer: >60 mL/min (ref >60)
GFR calc non Af Amer: >60 mL/min (ref >60)
Glucose, Bld: 227 mg/dL — ABNORMAL HIGH (ref 70–99)
Potassium: 4.3 mmol/L (ref 3.5–5.1)
Sodium: 137 mmol/L (ref 135–145)

## 2018-12-19 LAB — CBC WITH DIFFERENTIAL/PLATELET
Abs Immature Granulocytes: 0.04 10*3/uL (ref 0.00–0.07)
Basophils Absolute: 0 10*3/uL (ref 0.0–0.1)
Basophils Relative: 0 %
Eosinophils Absolute: 0 10*3/uL (ref 0.0–0.5)
Eosinophils Relative: 0 %
HCT: 37 % — ABNORMAL LOW (ref 39.0–52.0)
Hemoglobin: 11.5 g/dL — ABNORMAL LOW (ref 13.0–17.0)
Immature Granulocytes: 1 %
LYMPHS PCT: 5 %
Lymphs Abs: 0.3 10*3/uL — ABNORMAL LOW (ref 0.7–4.0)
MCH: 30.4 pg (ref 26.0–34.0)
MCHC: 31.1 g/dL (ref 30.0–36.0)
MCV: 97.9 fL (ref 80.0–100.0)
Monocytes Absolute: 0.7 10*3/uL (ref 0.1–1.0)
Monocytes Relative: 10 %
NEUTROS PCT: 84 %
Neutro Abs: 5.6 10*3/uL (ref 1.7–7.7)
Platelets: 70 10*3/uL — ABNORMAL LOW (ref 150–400)
RBC: 3.78 MIL/uL — ABNORMAL LOW (ref 4.22–5.81)
RDW: 19.8 % — ABNORMAL HIGH (ref 11.5–15.5)
WBC: 6.7 10*3/uL (ref 4.0–10.5)
nRBC: 0 % (ref 0.0–0.2)

## 2018-12-19 LAB — PROTIME-INR
INR: 1.2 (ref 0.8–1.2)
Prothrombin Time: 15 seconds (ref 11.4–15.2)

## 2018-12-19 MED ORDER — FENTANYL CITRATE (PF) 100 MCG/2ML IJ SOLN
INTRAMUSCULAR | Status: AC | PRN
  Administered 2018-12-19 (×2): 50 ug via INTRAVENOUS

## 2018-12-19 MED ORDER — HEPARIN SOD (PORK) LOCK FLUSH 100 UNIT/ML IV SOLN
INTRAVENOUS | Status: AC
  Filled 2018-12-19: qty 5

## 2018-12-19 MED ORDER — LIDOCAINE HCL 1 % IJ SOLN
INTRAMUSCULAR | Status: AC
  Filled 2018-12-19: qty 20

## 2018-12-19 MED ORDER — CEFAZOLIN SODIUM-DEXTROSE 2-4 GM/100ML-% IV SOLN
2.0000 g | Freq: Once | INTRAVENOUS | Status: AC
  Administered 2018-12-19: 2 g via INTRAVENOUS

## 2018-12-19 MED ORDER — LIDOCAINE HCL (PF) 1 % IJ SOLN
INTRAMUSCULAR | Status: AC | PRN
  Administered 2018-12-19 (×2): 10 mL

## 2018-12-19 MED ORDER — HEPARIN SOD (PORK) LOCK FLUSH 100 UNIT/ML IV SOLN
INTRAVENOUS | Status: AC | PRN
  Administered 2018-12-19: 500 [IU] via INTRAVENOUS

## 2018-12-19 MED ORDER — MIDAZOLAM HCL 2 MG/2ML IJ SOLN
INTRAMUSCULAR | Status: AC
  Filled 2018-12-19: qty 2

## 2018-12-19 MED ORDER — FENTANYL CITRATE (PF) 100 MCG/2ML IJ SOLN
INTRAMUSCULAR | Status: AC
  Filled 2018-12-19: qty 2

## 2018-12-19 MED ORDER — CEFAZOLIN SODIUM-DEXTROSE 2-4 GM/100ML-% IV SOLN
INTRAVENOUS | Status: AC
  Administered 2018-12-19: 2 g via INTRAVENOUS
  Filled 2018-12-19: qty 100

## 2018-12-19 MED ORDER — MIDAZOLAM HCL 2 MG/2ML IJ SOLN
INTRAMUSCULAR | Status: AC | PRN
  Administered 2018-12-19 (×2): 1 mg via INTRAVENOUS

## 2018-12-19 MED ORDER — PRO-STAT SUGAR FREE PO LIQD
30.0000 mL | Freq: Three times a day (TID) | ORAL | Status: DC
  Administered 2018-12-19 – 2018-12-28 (×24): 30 mL via ORAL
  Filled 2018-12-19 (×25): qty 30

## 2018-12-19 NOTE — Procedures (Signed)
Interventional Radiology Procedure Note  Procedure: Single Lumen Power Port Placement    Access:  Right IJ vein.  Findings: Catheter tip positioned at SVC/RA junction. Port is ready for immediate use.   Complications: None  EBL: < 10 mL  Recommendations:  - Ok to shower in 24 hours - Do not submerge for 7 days - Routine line care   [name] T. Kathlene Cote, M.D Pager:  [phone]

## 2018-12-19 NOTE — Progress Notes (Addendum)
Nutrition Follow-up  RD working remotely.   DOCUMENTATION CODES:   Morbid obesity  INTERVENTION:  - Weigh patient today. - Continue Glucerna Shake TID and will increase prostat to TID. - Continue to encourage PO intakes. - Recommend consideration of PEG placement as patient is consistently meeting <50% nutrition needs and is currently receiving radiation.   NUTRITION DIAGNOSIS:   Inadequate oral intake related to acute illness, decreased appetite as evidenced by meal completion < 50%. -improving  GOAL:   Patient will meet greater than or equal to 90% of their needs -unmet  MONITOR:   PO intake, Supplement acceptance, Weight trends, Labs  ASSESSMENT:   78 year old with past medical history relevant for CAD s/p CABG, s/p pacemaker placement, atrial fibrillation, stage 3 CKD, OSA, type 2 DM, NASH cirrhosis, recent diagnosis of metastatic hepatocellular carcinoma with disease to thoracic spine, and HTN. He was admitted with near syncope and found to be in complete heart block s/p pacemaker adjustment.  His hospital course has been complicated by bilateral lower extremity cellulitis, AKI on CKD complicated by hyperkalemia, dysphasia found to have esophageal dysmotility, recalcitrant back pain due to T7 pathologic fracture transferred to [facility] on 12/10/2018 for initiation of radiation therapy.  No new weight recorded since 3/21. Per review of RN flow sheet, last recorded PO intakes were 100% of all meals on 3/26 (total of 1145 kcal, 78 grams of protein). He has been NPO since midnight for port-a-cath placement today.   Order in place for Glucerna Shake TID and review of order indicates that patient has been accepting this ~50% of the time offered. Order also in place for Prostat BID and patient has accepted all packets offered.      Medications reviewed; 2000 units cholecalciferol/day, 160 mg fenofibrate/day, 40 mg IV lasix BID, sliding scale novolog, 10 units novolog TID, 20  units lantus BID, daily multivitamin with minerals, 5 ml mycostatin x6/day, 1 packet miralax BID, 250 mg florastor BID, 2 tablets senokot BID, 25 mg aldactone/day. Labs reviewed; CBGs: 287, 225, and 163 mg/dl today, Cl; 96 mmol/l, BUN: 41 mg/dl.     Diet Order:   Diet Order            Diet regular Room service appropriate? Yes; Fluid consistency: Thin  Diet effective now              EDUCATION NEEDS:   Not appropriate for education at this time  Skin:  Skin Assessment: Reviewed RN Assessment  Last BM:  3/28  Height:   Ht Readings from Last 1 Encounters:  12/03/18 6' 1"  (1.854 m)    Weight:   Wt Readings from Last 1 Encounters:  12/09/18 (!) 146.3 kg    Ideal Body Weight:  83.64 kg  BMI:  Body mass index is 42.55 kg/m.  Estimated Nutritional Needs:   Kcal:  2510-2760 kcal  Protein:  125-140 grams  Fluid:  >/= 2.3 L/day      Jarome Matin, MS, RD, LDN, [facility] Inpatient Clinical Dietitian Pager # [phone] After hours/weekend pager # [phone]

## 2018-12-19 NOTE — Progress Notes (Signed)
Physical Therapy Treatment Patient Details Name: Robert Gregory MRN: [id number] DOB: [birth year] Today's Date: 12/19/2018    History of Present Illness Pt is a 78 y/o male admitted 12/03/18 with dizziness, bradycardia and near syncope. Pt with complete heart block and metastatic hepatocellular carinoma with T7 compression fx. Not a candidate for verterbroplasty, to begin palliative radiation at [facility] 3/20. PMH includes DM, MI, ICD, HTN, HLD, A-fib.    PT Comments    Pt is progressing very well; recommend HHPT, discussed hospital bed and pt does not feel he needs it, he may sleep in his "stress-less chair"  Follow Up Recommendations  Home health PT;Supervision for mobility/OOB     Equipment Recommendations  None recommended by PT    Recommendations for Other Services       Precautions / Restrictions Precautions Precautions: Fall;Back Precaution Booklet Issued: No Spinal Brace: Thoracolumbosacral orthotic Restrictions Weight Bearing Restrictions: No    Mobility  Bed Mobility Overal bed mobility: Needs Assistance Bed Mobility: Sit to Sidelying Rolling: Min guard       Sit to sidelying: Min assist General bed mobility comments: multi-modal cues for log roll, light assist to elevate LEs  Transfers Overall transfer level: Needs assistance Equipment used: Rolling walker (2 wheeled) Transfers: Sit to/from [facility] Sit to Stand: Min guard Stand pivot transfers: Supervision       General transfer comment: cues for hand placement  Ambulation/Gait Ambulation/Gait assistance: Supervision Gait Distance (Feet): 250 Feet Assistive device: Rolling walker (2 wheeled) Gait Pattern/deviations: Step-through pattern;Decreased stride length;Trunk flexed Gait velocity: decr    General Gait Details: verbal cues for safety especially with turns, no LOB, 3 brief standing rests   Stairs             Wheelchair Mobility    Modified Rankin (Stroke  Patients Only)       Balance   Sitting-balance support: Feet supported;No upper extremity supported Sitting balance-Leahy Scale: Good     Standing balance support: Bilateral upper extremity supported Standing balance-Leahy Scale: Fair Standing balance comment: able to static stand without LOB                             Cognition Arousal/Alertness: Awake/alert Behavior During Therapy: WFL for tasks assessed/performed Overall Cognitive Status: Within Functional Limits for tasks assessed                                 General Comments: pt in good spirits and pleasant with PT.       Exercises General Exercises - Lower Extremity Ankle Circles/Pumps: AROM;Both;10 reps Other Exercises Other Exercises: bridging x5, small pain free ROM    General Comments        Pertinent Vitals/Pain Pain Assessment: 0-10 Faces Pain Scale: No hurt Pain Intervention(s): Monitored during session    Home Living                      Prior Function            PT Goals (current goals can now be found in the care plan section) Acute Rehab PT Goals Patient Stated Goal: to get this pain to decrease PT Goal Formulation: With patient/family Time For Goal Achievement: 12/19/18 Potential to Achieve Goals: Good Progress towards PT goals: Progressing toward goals    Frequency    Min 3X/week  PT Plan Current plan remains appropriate    Co-evaluation              AM-PAC PT "6 Clicks" Mobility   Outcome Measure  Help needed turning from your back to your side while in a flat bed without using bedrails?: A Little Help needed moving from lying on your back to sitting on the side of a flat bed without using bedrails?: A Little Help needed moving to and from a bed to a chair (including a wheelchair)?: A Little Help needed standing up from a chair using your arms (e.g., wheelchair or bedside chair)?: A Little Help needed to walk in hospital room?:  A Little Help needed climbing 3-5 steps with a railing? : A Little 6 Click Score: 18    End of Session Equipment Utilized During Treatment: Gait belt Activity Tolerance: Patient tolerated treatment well Patient left: with call bell/phone within reach;in bed   PT Visit Diagnosis: Pain;Muscle weakness (generalized) (M62.81);Unsteadiness on feet (R26.81)     Time: 8948-3475 PT Time Calculation (min) (ACUTE ONLY): 24 min  Charges:  $Gait Training: 23-37 mins                     Kenyon Ana, PT  Pager: [phone] Acute Rehab Dept [facility]): [phone]   12/19/2018    [facility] 12/19/2018, 4:53 PM

## 2018-12-19 NOTE — Progress Notes (Signed)
PROGRESS NOTE    Robert Gregory  CZY:[id number] DOB: [birth year] DOA: 12/03/2018 PCP: Seward Carol, MD   Brief Narrative:  78 year old with past medical history relevant for coronary artery disease status post CABG, status post pacemaker placement, atrial fibrillation on rivaroxaban, stage III CKD, obstructive sleep apnea, type 2 diabetes, [name] cirrhosis, recent diagnosis of metastatic hepatocellular carcinoma with disease to thoracic spine, hypertension admitted with near syncope and found to be in complete heart block status post pacemaker adjustment.  His hospital course has been complicated by bilateral lower extremity cellulitis, AKI on CKD complicated by hyperkalemia, dysphasia found to have esophageal dysmotility, recalcitrant back pain due to T7 pathologic fracture transferred to [facility] on 12/10/2018 for initiation of radiation therapy.   Assessment & Plan:   Principal Problem:   Near syncope Active Problems:   Essential hypertension   Hyperlipidemia   Type 2 diabetes mellitus with stage 3 chronic kidney disease (HCC)   Chronic back pain   Obesity, Class III, BMI 40-49.9 (morbid obesity) (HCC)   Cellulitis of right lower extremity   Complete heart block (HCC)   Acute renal failure superimposed on stage 3 chronic kidney disease (HCC)   Hepatocellular carcinoma (HCC)  #) Metastatic hepatocellular carcinoma/recalcitrant T7 pain due to pathologic fracture: Patient has been intermittently refusing certain pain medications as he is quite sensitive to them. -Discontinued oral oxycodone 2.5 to 5 mg as needed -Start tramadol 25 to 50 mg every 6 hours as needed -Patient is refused very rarely intermittently used NSAIDs in the past,  -Continue fentanyl patch 25 mcg every 72 hours -Continue lidocaine patch -Increase gabapentin 400 mg 3 times daily -Oncology following, [name] -Oncology started steroids as well as given 1 dose of zoledronic acid and calcitonin -Radiation oncology  following, treatment started on 12/11/2018 -Neurosurgery did not feel that there was any surgical options for the patient due to high risk of decompensation -Underwent Port-A-Cath placement on 12/19/2018.  #) Acute on chronic diastolic heart failure: Patient has an echo on 09/08/2018 that showed a normal ejection fraction but could not evaluate diastolic function.  This appears to have improved with starting IV diuresis.  Would continue wanted more additional day of IV diuresis. -Hold home oral furosemide -Continue IV furosemide 40 mg twice daily, transition to once a day tomorrow and oral day after  #) Acute encephalopathy due to medication: Resolved patient was noted to be confused and weak on 12/16/2018. And complained of vague nonspecific neurological symptomatology.  Stat head CT was performed.  Unremarkable patient returned to baseline. -Will review patient's pain medications however delicate balancing act between controlling the patient's pain and his sensitivity especially to opioid analgesics  #) Paroxysmal atrial fibrillation: -Continue rivaroxaban, oncology will discuss with general surgery about transitioning patient to enoxaparin subcu  -Continue beta-blocker  #) Type 2 diabetes complicated by neuropathy: Blood sugars have increased on steroids -Continue glargine 20 units twice daily -Continue aspart 10 units with meals 3 times daily -Patient is on U500 at home -Sliding scale insulin, AC at bedtime -Carb restricted diet  #) Bilateral lower extremity cellulitis: This appears to have largely resolved with a course of IV and p.o. antibiotics  #) Dysphagia: -Swallow study showed evidence of nonspecific esophageal dysmotility -Gastroenterology has signed off  #) Complete heart block status post pacemaker/near syncope: Patient initially presented with near syncope and was found to be in complete heart block.  Patient did have pacemaker changed from VVI to DDD -Continue  telemetry -Cardiology signed off, appreciate  recommendations  #) AKI on CKD stage III/hyperkalemia: This is resolved with IV fluids. -Avoid nephrotoxins  #) Coronary artery disease status post CABG/hypertension/hyperlipidemia: -Continue carvedilol 6.25 mg twice daily -Continue ARB  #) OSA: -Continue CPAP nightly  Fluids: Tolerating p.o. Electrolytes: Monitor and supplement Nutrition: Carb restricted diet  Disposition: Pending discharge to skilled nursing facility  DO NOT RESUSCITATE   Consultants:   Neurosurgery  Cardiology  Oncology, [name]  Radiation oncology  Gastroenterology, Sadie Haber  Physical medicine rehabilitation  Procedures:   Pacemaker transition from VVI to DDD pacing on 12/03/2018  Esophagram 12/05/2018 nonspecific esophageal motility disorder  Antimicrobials:   P.o. cephalexin started 12/08/2018 to 12/12/2018  IV cefazolin 12/03/2020 to 12/08/2018  IV vancomycin and Zosyn 12/03/2018 to 12/04/2018   Subjective: No acute complaints no nausea no vomiting no fever no chills no diarrhea.  Objective: Vitals:   12/19/18 1115 12/19/18 1150 12/19/18 1202 12/19/18 1648  BP: 125/69 121/63 121/63 116/62  Pulse: 67 81 84 82  Resp: 20 20    Temp:  97.6 F (36.4 C) 97.6 F (36.4 C)   TempSrc:   Oral   SpO2: 94% 96% 95%   Weight:      Height:        Intake/Output Summary (Last 24 hours) at 12/19/2018 1716 Last data filed at 12/19/2018 0019 Gross per 24 hour  Intake --  Output 1850 ml  Net -1850 ml   Filed Weights   12/07/18 6384 12/08/18 0509 12/09/18 0600  Weight: (!) 144.6 kg (!) 143.2 kg (!) 146.3 kg    Examination:  General exam: Appears calm and comfortable  Respiratory system: No increased work of breathing, anterior lung sounds clear, no wheezes, crackles, rhonchi, diminished lung sounds at bases Cardiovascular system: Regular rate and rhythm, no murmurs Gastrointestinal system: Soft, nondistended, no rebound or guarding, plus bowel  sounds. Central nervous system: Alert and oriented.  Bilateral lower extremity strength is 5 out of 5, upper cavity strength is 5 out of 5 Extremities: 1+ lower extremity edema Skin: No rashes over visible skin Psychiatry: Judgement and insight appear normal. Mood & affect appropriate.  Mildly slowed    Data Reviewed: I have personally reviewed following labs and imaging studies  CBC: Recent Labs  Lab 12/13/18 0539 12/15/18 0428 12/16/18 0347 12/18/18 0523 12/19/18 0411  WBC 7.2 7.7 7.0 6.9 6.7  NEUTROABS  --   --   --  5.5 5.6  HGB 10.6* 10.9* 11.3* 11.5* 11.5*  HCT 34.2* 35.7* 37.5* 36.5* 37.0*  MCV 100.9* 100.8* 101.4* 99.2 97.9  PLT 72* 75* 70* 79* 70*   Basic Metabolic Panel: Recent Labs  Lab 12/15/18 0428 12/16/18 0347 12/17/18 0511 12/18/18 0523 12/19/18 0411  NA 137 137 139 137   138 137  K 4.9 5.2* 4.9 4.2   4.2 4.3  CL 102 100 97* 95*   95* 96*  CO2 28 29 33* 31   32 31  GLUCOSE 213* 206* 241* 188*   194* 227*  BUN 35* 37* 39* 41*   42* 41*  CREATININE 0.72 0.77 0.92 0.95   0.98 0.93  CALCIUM 9.4 9.3 9.4 9.0   9.2 9.1  MG  --   --   --  2.3  --    GFR: Estimated Creatinine Clearance: 100.2 mL/min (by C-G formula based on SCr of 0.93 mg/dL). Liver Function Tests: Recent Labs  Lab 12/18/18 0523  AST 131*  ALT 171*  ALKPHOS 277*  BILITOT 1.5*  PROT 6.3*  ALBUMIN 3.5   No results for input(s): LIPASE, AMYLASE in the last 168 hours. No results for input(s): AMMONIA in the last 168 hours. Coagulation Profile: Recent Labs  Lab 12/19/18 0411  INR 1.2   Cardiac Enzymes: No results for input(s): CKTOTAL, CKMB, CKMBINDEX, TROPONINI in the last 168 hours. BNP (last 3 results) No results for input(s): PROBNP in the last 8760 hours. HbA1C: No results for input(s): HGBA1C in the last 72 hours. CBG: Recent Labs  Lab 12/19/18 0011 12/19/18 0412 12/19/18 0839 12/19/18 1150 12/19/18 1647  GLUCAP 287* 225* 163* 144* 306*   Lipid Profile: No  results for input(s): CHOL, HDL, LDLCALC, TRIG, CHOLHDL, LDLDIRECT in the last 72 hours. Thyroid Function Tests: No results for input(s): TSH, T4TOTAL, FREET4, T3FREE, THYROIDAB in the last 72 hours. Anemia Panel: No results for input(s): VITAMINB12, FOLATE, FERRITIN, TIBC, IRON, RETICCTPCT in the last 72 hours. Sepsis Labs: No results for input(s): PROCALCITON, LATICACIDVEN in the last 168 hours.  No results found for this or any previous visit (from the past 240 hour(s)).       Radiology Studies: Ir Imaging Guided Port Insertion  Result Date: 12/19/2018 CLINICAL DATA:  Metastatic hepatocellular carcinoma and need for porta cath for further therapy. EXAM: IMPLANTED PORT A CATH PLACEMENT WITH ULTRASOUND AND FLUOROSCOPIC GUIDANCE ANESTHESIA/SEDATION: 2.0 mg IV Versed; 100 mcg IV Fentanyl Total Moderate Sedation Time:  41 minutes The patient's level of consciousness and physiologic status were continuously monitored during the procedure by Radiology nursing. Additional Medications: 2 g IV Ancef. FLUOROSCOPY TIME:  42 seconds.  42.0 mGy. PROCEDURE: The procedure, risks, benefits, and alternatives were explained to the patient. Questions regarding the procedure were encouraged and answered. The patient understands and consents to the procedure. A time-out was performed prior to initiating the procedure. Ultrasound was utilized to confirm patency of the right internal jugular vein. The right neck and chest were prepped with chlorhexidine in a sterile fashion, and a sterile drape was applied covering the operative field. Maximum barrier sterile technique with sterile gowns and gloves were used for the procedure. Local anesthesia was provided with 1% lidocaine. After creating a small venotomy incision, a 21 gauge needle was advanced into the right internal jugular vein under direct, real-time ultrasound guidance. Ultrasound image documentation was performed. After securing guidewire access, an 8 Fr dilator  was placed. A J-wire was kinked to measure appropriate catheter length. A subcutaneous port pocket was then created along the upper chest wall utilizing sharp and blunt dissection. Portable cautery was utilized. The pocket was irrigated with sterile saline. A single lumen power injectable port was chosen for placement. The 8 Fr catheter was tunneled from the port pocket site to the venotomy incision. The port was placed in the pocket. External catheter was trimmed to appropriate length based on guidewire measurement. At the venotomy, an 8 Fr peel-away sheath was placed over a guidewire. The catheter was then placed through the sheath and the sheath removed. Final catheter positioning was confirmed and documented with a fluoroscopic spot image. The port was accessed with a needle and aspirated and flushed with heparinized saline. The access needle was removed. The venotomy and port pocket incisions were closed with subcutaneous 3-0 Monocryl and subcuticular 4-0 Vicryl. Dermabond was applied to both incisions. COMPLICATIONS: COMPLICATIONS None FINDINGS: After catheter placement, the tip lies at the cavo-atrial junction. The catheter aspirates normally and is ready for immediate use. IMPRESSION: Placement of single lumen port a cath via right internal jugular vein. The catheter  tip lies at the cavo-atrial junction. A power injectable port a cath was placed and is ready for immediate use. Electronically Signed   By: Aletta Edouard M.D.   On: 12/19/2018 11:25        Scheduled Meds:  calcitonin (salmon)  1 spray Alternating Nares Daily   carvedilol  6.25 mg Oral BID WC   cholecalciferol  2,000 Units Oral Q supper   dexamethasone  8 mg Oral Daily   enoxaparin (LOVENOX) injection  1 mg/kg Subcutaneous BID   ezetimibe  10 mg Oral Daily   feeding supplement (GLUCERNA SHAKE)  237 mL Oral TID BM   feeding supplement (PRO-STAT SUGAR FREE 64)  30 mL Oral TID BM   fenofibrate  160 mg Oral Daily    fentaNYL  1 patch Transdermal Q72H   gabapentin  400 mg Oral TID   heparin lock flush       insulin aspart  0-9 Units Subcutaneous TID WC   insulin aspart  10 Units Subcutaneous TID WC   insulin glargine  20 Units Subcutaneous BID   irbesartan  300 mg Oral Daily   lidocaine  1 patch Transdermal Q24H   lidocaine       multivitamin with minerals  1 tablet Oral Daily   nystatin  5 mL Oral 6 X Daily   omega-3 acid ethyl esters  2 g Oral BID   polyethylene glycol  17 g Oral BID   pravastatin  80 mg Oral QHS   saccharomyces boulardii  250 mg Oral Daily   senna-docusate  2 tablet Oral BID   sodium bicarbonate/sodium chloride   Mouth Rinse QID   sodium chloride flush  3 mL Intravenous Q12H   spironolactone  25 mg Oral Daily   Continuous Infusions:   LOS: 16 days    Time spent: [place], MD [facility] If 7PM-7AM, please contact night-coverage [email or web address] Password [facility] 12/19/2018, 5:16 PM

## 2018-12-19 NOTE — Progress Notes (Signed)
Patient received back from IR post PAC placement.  Patient stable, alert at this time.  Medications administered, condom catheter replaced.  Patient voided a large amount in IR per IR RN. PAC dressing CDI. Will continue to monitor.

## 2018-12-19 NOTE — Consult Note (Signed)
Chief Complaint: Patient was seen in consultation today for port placement.  Referring Physician(s): Dr. Burney Gauze  Supervising Physician: Aletta Edouard  Patient Status: [name] - In-pt  History of Present Illness: Robert Gregory is a 78 y.o. male with a past medical history significant for arthritis, OSA with CPAP use, peripheral neuropathy, pernicious anemia, iron deficiency anemia, DM, chronic poorly healing right lower extremity wound, ischemic cardiomyopathy s/p ICD placement, CAD s/p CABG, HLD, HTN, a.fib on Xarelto, cirrhosis 2/2 hepatic steatosis and hepatocellular carcinoma followed by [name]. Robert Gregory was initially admitted to [facility] on 12/03/18 after becoming lightheaded and nauseous upon waking - when this did not resolve he checked his blood pressure/pulse and found both to be very low, he then called 911 and was brought to the ER for evaluation. He was admitted for further evaluation and management, He was seen by cardiology and his ICD was reprogrammed which helped his symptoms.   Prior to this admission he had been seen by Dr. Paulita Fujita (GI) the week prior for cirrhosis and was noted to have a right liver dome 8.0 cm mass compatible with hepatocellular carcinoma as well as a new 3.5 cm left adrenal nodule and right pericardiophrenic adenopathy suggesting metastatic disease. He was also seen by orthopedics that same week for chronic back pain and was found to have a T7 compression fracture. IR and neurosurgery were both consulted for possible intervention to help with his back pain however he was deemed a poor candidate by both services. His back pain is currently being managed by PT/OT, PO medications and palliative radiation. [name] has requested a port be placed by IR during this admission so that patient may begin a combination of immunotherapy and anti-angiogenesis therapy.  Past Medical History:  Diagnosis Date  . Arthritis   . Atrial fibrillation ([place])    takes  Xarelto daily  . Cellulitis    RLE  . Cholelithiasis   . Chronic back pain    scoliosis/arthritis  . Complication of anesthesia    forgetful for about 2-3wks after anesthesia  . Hepatocellular carcinoma ([place])   . History of colon polyps   . Hyperlipidemia    takes [employer] daily  . Hypertension    takes Benicar and Coreg daily  . ICD (implantable cardioverter-defibrillator) in place 11/07/2013  . Iron deficiency anemia due to chronic blood loss 08/24/2018  . Iron malabsorption 08/24/2018  . Ischemic cardiomyopathy   . Myocardial infarction ([place]) 1997   on the OR table   . Nephrolithiasis   . Obstructive sleep apnea    CPAP  . Peripheral neuropathy   . Pernicious anemia 08/24/2018  . Spinal stenosis   . Thrombocytopenia ([place])   . Total knee replacement status 11/29/2014  . Type 2 diabetes mellitus with stage 3 chronic kidney disease ([place]) 05/19/2015    Past Surgical History:  Procedure Laterality Date  . CARDIAC CATHETERIZATION N/A 10/02/2015   Procedure: Left Heart Cath and Cors/Grafts Angiography;  Surgeon: Belva Crome, MD;  Location: [facility];  Service: Cardiovascular;  Laterality: N/A;  . COLONOSCOPY    . coronary artery bypass and graft  1997   x 5  . ESOPHAGOGASTRODUODENOSCOPY    . ICD placed    . LAMINECTOMY    . LITHOTRIPSY    . Right rotator cuff surgery    . TONSILLECTOMY     adenoids  . TOTAL KNEE ARTHROPLASTY Left   . TOTAL KNEE ARTHROPLASTY Right 11/29/2014  Procedure: RIGHT TOTAL KNEE ARTHROPLASTY;  Surgeon: Leandrew Koyanagi, MD;  Location: [place];  Service: Orthopedics;  Laterality: Right;    Allergies: Feraheme [ferumoxytol]; Contrast media [iodinated diagnostic agents]; Iodine; Ioxaglate; Adhesive [tape]; and Seasonal ic [cholestatin]  Medications: Prior to Admission medications   Medication Sig Start Date End Date Taking? Authorizing Provider  acetaminophen (TYLENOL) 500 MG tablet Take 1,000 mg by mouth every 6 (six) hours  as needed for headache (pain).   Yes [provider]  BD INSULIN SYRINGE U-500 31G X 6MM 0.5 ML MISC USE 1 SYRINGE TO INJECT UNDER THE SKIN THREE TIMES A DAY BEFORE MEALS Patient taking differently: Inject into the skin 2 (two) times daily.  11/11/17  Yes Philemon Kingdom, MD  BENICAR 40 MG tablet TAKE 1 TABLET TWICE A DAY Patient taking differently: Take 40 mg by mouth 2 (two) times daily.  03/13/18  Yes Lelon Perla, MD  carvedilol (COREG) 6.25 MG tablet Take 1 tablet (6.25 mg total) by mouth 2 (two) times daily with a meal. 05/31/18  Yes Crenshaw, Denice Bors, MD  Cholecalciferol (VITAMIN D) 50 MCG (2000 UT) tablet Take 2,000 Units by mouth daily with supper.    Yes [provider]  Coenzyme Q10 200 MG capsule Take 200 mg by mouth daily with supper.    Yes [provider]  ezetimibe (ZETIA) 10 MG tablet TAKE 1 TABLET DAILY Patient taking differently: Take 10 mg by mouth daily.  07/11/18  Yes Lelon Perla, MD  furosemide (LASIX) 40 MG tablet Take 40 mg by mouth daily.   Yes [provider]  insulin regular human CONCENTRATED (HUMULIN R) 500 UNIT/ML injection Inject up to 200 units a day as advised Patient taking differently: Inject 35-150 Units into the skin See admin instructions. Inject 150 units subcutaneously with breakfast (drawn on insulin syringe) and 70 units with lunch. For CBG 100-120 decrease to 1/2 dose (75 units with breakfast and 35 units with lunch. If CBG <100 hold insulin. 06/29/18  Yes Philemon Kingdom, MD  Levomefolate Glucosamine (METHYLFOLATE PO) Take 2,000 mcg by mouth daily with supper.   Yes [provider]  metFORMIN (GLUCOPHAGE-XR) 500 MG 24 hr tablet Take 1000 mg with lunch Patient taking differently: Take 1,000 mg by mouth daily with lunch.  09/15/18  Yes Philemon Kingdom, MD  methocarbamol (ROBAXIN) 750 MG tablet Take 375 mg by mouth every 8 (eight) hours as needed for muscle spasms (back pain).  11/20/18  Yes [provider]  Multiple Vitamin (MULTIVITAMIN WITH MINERALS) TABS tablet Take 1 tablet by mouth daily. Centrum Silver   Yes [provider]  nitroGLYCERIN (NITROSTAT) 0.4 MG SL tablet Place 1 tablet (0.4 mg total) under the tongue every 5 (five) minutes as needed for chest pain. 07/03/18  Yes Lelon Perla, MD  omega-3 acid ethyl esters (LOVAZA) 1 g capsule TAKE 2 CAPSULES TWICE A DAY Patient taking differently: Take 2 g by mouth 2 (two) times daily.  04/04/18  Yes Lelon Perla, MD  oxyCODONE-acetaminophen (PERCOCET) 7.5-325 MG tablet Take 1 tablet by mouth every 8 (eight) hours as needed for severe pain.   Yes [provider]  Peppermint Oil (IBGARD) 90 MG CPCR Take 90 mg by mouth 2 (two) times daily.   Yes [provider]  potassium chloride (K-DUR) 10 MEQ tablet Take 10 mEq by mouth daily with breakfast.  10/28/18  Yes [provider]  pravastatin (PRAVACHOL) 80 MG tablet TAKE 1 TABLET DAILY Patient taking  differently: Take 80 mg by mouth at bedtime.  03/13/18  Yes Lelon Perla, MD  Probiotic Product (PROBIOTIC DAILY PO) Take 1 tablet by mouth 2 (two) times daily.    Yes [provider]  spironolactone (ALDACTONE) 25 MG tablet Take 25 mg by mouth daily.   Yes [provider]  TRICOR 48 MG tablet TAKE 1 TABLET DAILY Patient taking differently: Take 48 mg by mouth daily.  07/11/18  Yes Crenshaw, Denice Bors, MD  XARELTO 20 MG TABS tablet TAKE 1 TABLET DAILY WITH SUPPER Patient taking differently: Take 20 mg by mouth daily with supper.  03/13/18  Yes Lelon Perla, MD     Family History  Problem Relation Age of Onset  . Pneumonia Mother   . Heart disease Other        No family history    Social History   Socioeconomic History  . Marital status: Married    Spouse name: Not on file  . Number of children: Not on file  . Years of education: Not on file  . Highest education level: Not on file  Occupational History     Comment: Retired from [employer]  . Financial resource strain: Not on file  . Food insecurity:    Worry: Not on file    Inability: Not on file  . Transportation needs:    Medical: Not on file    Non-medical: Not on file  Tobacco Use  . Smoking status: Never Smoker  . Smokeless tobacco: Never Used  Substance and Sexual Activity  . Alcohol use: Yes    Alcohol/week: 0.0 standard drinks    Comment: once drink a month  . Drug use: No  . Sexual activity: Not on file  Lifestyle  . Physical activity:    Days per week: Not on file    Minutes per session: Not on file  . Stress: Not on file  Relationships  . Social connections:    Talks on phone: Not on file    Gets together: Not on file    Attends religious service: Not on file    Active member of club or organization: Not on file    Attends meetings of clubs or organizations: Not on file    Relationship status: Not on file  Other Topics Concern  . Not on file  Social History Narrative   Lives at home with wife.  Was in the [employer] [facility].      Review of Systems: A 12 point ROS discussed and pertinent positives are indicated in the HPI above.  All other systems are negative.  Review of Systems  Constitutional: Negative for chills and fever.  Respiratory: Positive for shortness of breath (with exertion). Negative for cough.   Cardiovascular: Negative for chest pain.  Gastrointestinal: Negative for abdominal pain, diarrhea, nausea and vomiting.  Musculoskeletal: Positive for back pain.  Neurological: Positive for light-headedness (sometimes with standing). Negative for headaches.    Vital Signs: BP 119/66 (BP Location: Right Arm)   Pulse 82   Temp 97.9 F (36.6 C)   Resp 20   Ht 6' 1"  (1.854 m)   Wt (!) 322 lb 8.5 oz (146.3 kg)   SpO2 93%   BMI 42.55 kg/m   Physical Exam Vitals signs and nursing note reviewed.  Constitutional:      General: He is not in acute distress.    Appearance: He is obese.   HENT:     Head: Normocephalic.  Cardiovascular:     Rate and Rhythm: Normal rate and regular rhythm.     Comments: (+) ICD Pulmonary:     Effort: Pulmonary effort is normal.     Breath sounds: Normal breath sounds.  Abdominal:     General: There is no distension.     Palpations: Abdomen is soft.     Tenderness: There is no abdominal tenderness.  Skin:    General: Skin is warm and dry.  Neurological:     Mental Status: He is alert and oriented to person, place, and time.  Psychiatric:        Mood and Affect: Mood normal.        Behavior: Behavior normal.        Thought Content: Thought content normal.        Judgment: Judgment normal.          Imaging: Ct Head Wo Contrast  Result Date: 12/17/2018 CLINICAL DATA:  Syncope EXAM: CT HEAD WITHOUT CONTRAST TECHNIQUE: Contiguous axial images were obtained from the base of the skull through the vertex without intravenous contrast. COMPARISON:  11/14/2018 FINDINGS: Brain: No evidence of acute infarction, hemorrhage, hydrocephalus, extra-axial collection or mass lesion/mass effect. Mild cortical atrophy. Mild subcortical white matter and periventricular small vessel ischemic changes. Vascular: Intracranial atherosclerosis. Skull: Normal. Negative for fracture or focal lesion. Sinuses/Orbits: The visualized paranasal sinuses are essentially clear. The mastoid air cells are unopacified. Other: None. IMPRESSION: No evidence of acute intracranial abnormality. Atrophy with small vessel ischemic changes. Electronically Signed   By: Julian Hy M.D.   On: 12/17/2018 05:32   Ct Thoracic Spine Wo Contrast  Result Date: 12/06/2018 CLINICAL DATA:  T7 compression fracture.  Hepatocellular carcinoma. EXAM: CT THORACIC SPINE WITHOUT CONTRAST TECHNIQUE: Multidetector CT images of the thoracic were obtained using the standard protocol without intravenous contrast. COMPARISON:  CT scan of the thoracic spine performed at [facility] on  11/27/2018 FINDINGS: Vertebrae: There is tumor destroying the T7 and T8 vertebral bodies. There is a pathologic fracture of the T7 vertebral body extending into the posterior margin of T7 with new slight protrusion of the posterior margin of T7 into the spinal canal since the prior exam. There is also paraspinal tumor around T7 and T8. There is no pathologic fracture of T8 at this time. There is no discrete tumor within the spinal canal although the lack of contrast limits definition of the interface between the soft tissues, bone, and thecal sac. There is a new tiny left pleural effusion and a progressed small right pleural effusion since the prior study. There is an 11 mm precarinal lymph node and a 11 mm left periaortic lymph node both best seen on image 73 of series 4. These appear larger than on the prior exam. 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 No acute abnormality of the remainder of the thoracic spine. Anterior osteophytes fuse multiple levels. There is degenerative disc disease in the lower cervical spine with a soft disc protrusions central and to the right at C5-6 with some compression of the thecal sac. Aortic atherosclerosis. IMPRESSION: 1. Progression of the destruction and pathologic fracture of T7 since the prior CT scan of 11/27/2018. The partially destroyed posterior margin of T7-8 protrudes slightly into the spinal canal. 2. Unchanged tumor infiltration of the majority of the T8 vertebral body without a pathologic fracture. 3. Progressive paraspinal tumor at T7 and T8. 4. Progressive small right pleural effusion. New small left  pleural effusion. 5. Enlarging mediastinal lymph nodes. 6. No definitive tumor within the spinal canal although sensitivity is limited due to the lack of contrast. Electronically Signed   By: Lorriane Shire M.D.   On: 12/06/2018 13:22   US Abdomen Complete  Result Date: 11/21/2018 CLINICAL DATA:  Cirrhosis EXAM: ABDOMEN  ULTRASOUND COMPLETE COMPARISON:  CT scan 04/28/2018 FINDINGS: Gallbladder: The gallbladder is contracted. Multiple shadowing gallstones are noted. The largest measures 1.3 cm. No pericholecystic fluid or sonographic Murphy sign to suggest acute cholecystitis. Common bile duct: Diameter: 4.3 mm Liver: Very irregular liver contour with coarse heterogeneous echogenicity consistent with cirrhosis. There are scattered echogenic areas in the liver which could be areas of focal fat but could not exclude neoplastic lesions. The largest measures 6.2 x 3.2 x 3.4 cm. Patent portal vein flow but mixed hepatofugal and hepatopetal flow noted IVC: Poorly visualized Pancreas: Poorly visualized Spleen: Enlarged. The spleen measures a maximum of 19 cm in the volume is 1680 cubic cm. Right Kidney: Length: 11.2 cm. Normal renal cortical thickness and echogenicity without focal lesions or hydronephrosis. Left Kidney: Length: 12.4 cm. Normal renal cortical thickness and echogenicity without focal lesions or hydronephrosis. Abdominal aorta: Obscured Other findings: No ascites. IMPRESSION: 1. Limited examination due to poor sonographic window. 2. Cirrhotic changes involving the liver. There are several indeterminate echogenic foci as discussed above. Recommend MRI abdomen without and with contrast for further evaluation and to exclude hepatomas. 3. Poor visualization of the pancreas, IVC and aorta. 4. Splenomegaly. Electronically Signed   By: Marijo Sanes M.D.   On: 11/21/2018 15:00   Ct Abdomen W Wo Contrast  Result Date: 12/01/2018 CLINICAL DATA:  Cirrhosis. Indeterminate echogenic focus in right liver on recent ultrasound. EXAM: CT ABDOMEN WITHOUT AND WITH CONTRAST TECHNIQUE: Multidetector CT imaging of the abdomen was performed following the standard protocol before and following the bolus administration of intravenous contrast. CONTRAST:  133m OMNIPAQUE IOHEXOL 300 MG/ML  SOLN COMPARISON:  05/26/2018 CT abdomen. FINDINGS: Lower  chest: No significant pulmonary nodules or acute consolidative airspace disease. Punctate right lung base calcified granuloma. ICD leads are seen terminating in the right atrium and right ventricle. Coronary atherosclerosis. Newly mildly enlarged 1.1 cm right pericardiophrenic node (series 9/image 21). Hepatobiliary: Diffusely irregular liver surface, compatible with hepatic cirrhosis. There is an 8.0 x 6.0 cm right liver dome mass (series 5/image 14) with mild arterial phase hyperenhancement, no clear washout or enhancing capsule, not well visualized on the 05/26/2018 CT, favored to be increased from 5.3 x 4.5 cm, compatible with LI-RADS category 5. surrounding heterogeneous enhancement and scattered subcentimeter nodules in the superior right liver lobe, not appreciably changed. Cholelithiasis. No gallbladder wall thickening. No biliary ductal dilatation. Pancreas: Normal, with no mass or duct dilation. Spleen: Moderate splenomegaly (craniocaudal splenic length 17.0 cm). No splenic mass. Adrenals/Urinary Tract: Left adrenal 3.5 cm nodule with precontrast density 41 HU, new since 05/26/2018 CT. No right adrenal nodule. Nonobstructing 5 mm lower left renal stones. Borderline mild bilateral hydronephrosis, new. Stomach/Bowel: Normal non-distended stomach. Visualized small and large bowel is normal caliber, with no bowel wall thickening. Vascular/Lymphatic: Atherosclerotic nonaneurysmal abdominal aorta. Patent portal, splenic, hepatic and renal veins. No pathologically enlarged lymph nodes in the abdomen. Other: No pneumoperitoneum, ascites or focal fluid collection. Musculoskeletal: No aggressive appearing focal osseous lesions. Marked thoracolumbar spondylosis. IMPRESSION: 1. Cirrhosis. Right liver dome 8.0 cm mass, LI-RADS category 5 (definitely hepatocellular carcinoma). 2. New 3.5 cm left adrenal nodule, compatible with left adrenal metastasis. 3. New right  pericardiophrenic adenopathy suggesting nodal  metastasis. 4. Moderate splenomegaly. 5. Borderline mild bilateral hydronephrosis is new, recommend correlation with serum creatinine. Nonobstructing left nephrolithiasis. 6. Cholelithiasis. 7.  Aortic Atherosclerosis (ICD10-I70.0). These results will be called to the ordering clinician or representative by the Radiologist Assistant, and communication documented in the PACS or zVision Dashboard. Electronically Signed   By: Ilona Sorrel M.D.   On: 12/01/2018 17:55   Dg Chest Port 1 View  Result Date: 12/15/2018 CLINICAL DATA:  New onset shortness of breath, history type II diabetes mellitus, hypertension, atrial fibrillation, coronary artery disease, ischemic cardiomyopathy post MI EXAM: PORTABLE CHEST 1 VIEW COMPARISON:  Portable exam 0901 hours compared to 12/03/2018 FINDINGS: Left subclavian sequential transvenous ICD leads project at right atrium and right ventricle. Enlargement of cardiac silhouette post CABG. Pulmonary vascular congestion. Survey aorta BILATERAL interstitial infiltrates favoring pulmonary edema. Associated bibasilar atelectasis. No pleural effusion or pneumothorax. Osseous demineralization with chronic RIGHT rotator cuff tear. IMPRESSION: Enlargement of cardiac silhouette post CABG and ICD with pulmonary vascular congestion and probable mild pulmonary edema. Bibasilar atelectasis. Electronically Signed   By: Lavonia Dana M.D.   On: 12/15/2018 09:44   Dg Chest Port 1 View  Result Date: 12/03/2018 CLINICAL DATA:  Near syncope. EXAM: PORTABLE CHEST 1 VIEW COMPARISON:  September 07, 2018 FINDINGS: Stable AICD device. Cardiomegaly, unchanged. The hila, mediastinum, lungs, and pleura are unremarkable. A transcutaneous pacer lead overlies the upper medial left chest. IMPRESSION: No acute interval change. Electronically Signed   By: Dorise Bullion III M.D   On: 12/03/2018 14:51   Dg Esophagus W Single Cm (sol Or Thin Ba)  Result Date: 12/05/2018 CLINICAL DATA:  78 year old male with history  of difficulty swallowing. Unable to belt. EXAM: ESOPHOGRAM/BARIUM SWALLOW TECHNIQUE: Single contrast examination was performed using  thin barium. FLUOROSCOPY TIME:  Fluoroscopy Time:  42 seconds Radiation Exposure Index (if provided by the fluoroscopic device): 9.9 mGy COMPARISON:  None. FINDINGS: Single contrast imaging of the esophagus was performed due to limited patient mobility. Within the limitations of the examination, the esophagus appears structurally normal, without definite esophageal mass, stricture or esophageal ring. No hiatal hernia. During real-time observation multiple single swallow attempts fail to fully propagate, and extensive tertiary contractions were observed throughout the examination. IMPRESSION: 1. Nonspecific esophageal motility disorder with extensive tertiary contractions. Electronically Signed   By: Vinnie Langton M.D.   On: 12/05/2018 14:14    Labs:  CBC: Recent Labs    12/15/18 0428 12/16/18 0347 12/18/18 0523 12/19/18 0411  WBC 7.7 7.0 6.9 6.7  HGB 10.9* 11.3* 11.5* 11.5*  HCT 35.7* 37.5* 36.5* 37.0*  PLT 75* 70* 79* 70*    COAGS: Recent Labs    12/04/18 0416 12/05/18 0335 12/19/18 0411  INR  --   --  1.2  APTT 70* 120*  --     BMP: Recent Labs    12/16/18 0347 12/17/18 0511 12/18/18 0523 12/19/18 0411  NA 137 139 137  138 137  K 5.2* 4.9 4.2  4.2 4.3  CL 100 97* 95*  95* 96*  CO2 29 33* 31  32 31  GLUCOSE 206* 241* 188*  194* 227*  BUN 37* 39* 41*  42* 41*  CALCIUM 9.3 9.4 9.0  9.2 9.1  CREATININE 0.77 0.92 0.95  0.98 0.93  GFRNONAA >60 >60 >60  >60 >60  GFRAA >60 >60 >60  >60 >60    LIVER FUNCTION TESTS: Recent Labs    12/03/18 1417 12/05/18 0335 12/06/18 0310  12/18/18 0523  BILITOT 1.0 1.2 1.5* 1.5*  AST 85* 87* 55* 131*  ALT 94* 96* 65* 171*  ALKPHOS 151* 125 110 277*  PROT 6.5 6.1* 6.5 6.3*  ALBUMIN 3.1* 3.4* 4.0 3.5    TUMOR MARKERS: No results for input(s): AFPTM, CEA, CA199, CHROMGRNA in the last  8760 hours.  Assessment and Plan:  78 y/o with recently diagnosed metastatic hepatocellular carcinoma with pathologic T7 compression fracture not amenable to percutaneous intervention followed by [name]. Request has been made to IR for port placement so that patient may begin immunotherapy + anti-angiogenesis therapy in addition to palliative thoracic spine radiation.  Patient has been NPO since midnight, he takes Xarelto for a.fib with last dose 3/29 - Lovenox was held last night and this morning in anticipation of port placement today. Afebrile, WBC 6.7, hgb 11.5, plt 70, INR 1.2, creatinine 0.93.  Risks and benefits of image guided port-a-catheter placement was discussed with the patient including, but not limited to bleeding, infection, pneumothorax, or fibrin sheath development and need for additional procedures.  All of the patient's questions were answered, patient is agreeable to proceed.  Consent signed and in chart.  Thank you for this interesting consult.  I greatly enjoyed meeting Robert Gregory and look forward to participating in their care.  A copy of this report was sent to the requesting provider on this date.  Electronically Signed: Joaquim Nam, PA-C 12/19/2018, 8:50 AM   I spent a total of 40 Minutes in face to face in clinical consultation, greater than 50% of which was counseling/coordinating care for port placement.

## 2018-12-19 NOTE — Care Management Important Message (Signed)
Important Message  Patient Details  Name: Robert Gregory MRN: [id number] Date of Birth: 29-Jan-1941   Medicare Important Message Given:  Yes    [name] 12/19/2018, 11:53 AMImportant Message  Patient Details  Name: Robert Gregory MRN: [id number] Date of Birth: 10/20/1940   Medicare Important Message Given:  Yes    [name] 12/19/2018, 11:53 AM

## 2018-12-19 NOTE — Progress Notes (Signed)
It seems like Robert Gregory is improving daily.  He says the pain is getting better.  He is able to get on the radiation table without much difficulty.  He is eating a little bit better.  We got him off his Xarelto and onto Lovenox right now.  This is in anticipation of getting a Port-A-Cath hopefully for tomorrow or Thursday.  His labs show a white cell count of 6.7.  Hemoglobin 0.5.  Platelet count 70,000.  I do not think that the thrombocytopenia should be much of a problem with his Port-A-Cath being placed.  His blood sugars are still a little on the high side.  I am sure this is from the Decadron that he is taking.  He will have radiation therapy this week and then be done with it.  Once he is finished with radiation,, then I will decrease the steroid dose.  He is having no mouth sores.  There is no thrush.  He is very diligent with oral care.  He has had no issues with bowels or bladder.  His vital signs all look stable.  His temperature is 97.9.  Pulse 82.  Blood pressure 119/66.  His oral exam shows no thrush.  He has no adenopathy in the neck.  Cardiac exam is regular rate and rhythm.  Lungs sound clear bilaterally.  Abdominal exam is obese.  Bowel sounds are present.  Extremities shows no edema.  He still has the dressing on the right lower leg.  Of note, he is off antibiotics for the cellulitis.  It looks like he is getting better steadily.  I am glad that his pain is improved.  He is more functional.  I do feel more confident now that he will do okay with systemic therapy for the hepatocellular carcinoma.  I, once again, appreciate the fantastic care that he is getting from all the staff on [address].   Lattie Haw, MD  Psalm 18:2

## 2018-12-20 ENCOUNTER — Ambulatory Visit
Admission: RE | Admit: 2018-12-20 | Discharge: 2018-12-20 | Disposition: A | Discharge status: Home / Self Care | Payer: Medicare Other | Source: Ambulatory Visit | Attending: Radiation Oncology | Admitting: Radiation Oncology

## 2018-12-20 ENCOUNTER — Ambulatory Visit: Payer: Medicare Other

## 2018-12-20 LAB — BASIC METABOLIC PANEL
ANION GAP: 9 (ref 5–15)
BUN: 45 mg/dL — ABNORMAL HIGH (ref 8–23)
CO2: 31 mmol/L (ref 22–32)
CREATININE: 0.96 mg/dL (ref 0.61–1.24)
Calcium: 9.1 mg/dL (ref 8.9–10.3)
Chloride: 96 mmol/L — ABNORMAL LOW (ref 98–111)
GFR calc Af Amer: >60 mL/min (ref >60)
GFR calc non Af Amer: >60 mL/min (ref >60)
Glucose, Bld: 225 mg/dL — ABNORMAL HIGH (ref 70–99)
Potassium: 4.4 mmol/L (ref 3.5–5.1)
Sodium: 136 mmol/L (ref 135–145)

## 2018-12-20 LAB — GLUCOSE, CAPILLARY
Glucose-Capillary: 161 mg/dL — ABNORMAL HIGH (ref 70–99)
Glucose-Capillary: 193 mg/dL — ABNORMAL HIGH (ref 70–99)
Glucose-Capillary: 185 mg/dL — ABNORMAL HIGH (ref 70–99)
Glucose-Capillary: 285 mg/dL — ABNORMAL HIGH (ref 70–99)

## 2018-12-20 MED ORDER — RIVAROXABAN 20 MG PO TABS
20.0000 mg | ORAL_TABLET | Freq: Every day | ORAL | Status: DC
  Administered 2018-12-21 – 2018-12-27 (×7): 20 mg via ORAL
  Filled 2018-12-20 (×7): qty 1

## 2018-12-20 MED ORDER — FUROSEMIDE 40 MG PO TABS
40.0000 mg | ORAL_TABLET | Freq: Every day | ORAL | Status: DC
  Administered 2018-12-20 – 2018-12-21 (×2): 40 mg via ORAL
  Filled 2018-12-20 (×2): qty 1

## 2018-12-20 MED ORDER — DEXAMETHASONE 4 MG PO TABS
4.0000 mg | ORAL_TABLET | Freq: Every day | ORAL | Status: DC
  Administered 2018-12-20 – 2018-12-21 (×2): 4 mg via ORAL
  Filled 2018-12-20 (×2): qty 1

## 2018-12-20 MED ORDER — RIVAROXABAN 20 MG PO TABS
20.0000 mg | ORAL_TABLET | Freq: Once | ORAL | Status: AC
  Administered 2018-12-20: 10:00:00 20 mg via ORAL
  Filled 2018-12-20: qty 1

## 2018-12-20 NOTE — Progress Notes (Signed)
Received report from A. Georgina Quint, Therapist, sports. No change from initial pm assessment. Will continue to monitor and follow the POC.

## 2018-12-20 NOTE — Progress Notes (Signed)
Inpatient Diabetes Program Recommendations  AACE/ADA: New Consensus Statement on Inpatient Glycemic Control (2015)  Target Ranges:  Prepandial:   less than 140 mg/dL      Peak postprandial:   less than 180 mg/dL (1-2 hours)      Critically ill patients:  140 - 180 mg/dL   Lab Results  Component Value Date   GLUCAP 161 (H) 12/20/2018   HGBA1C 7.0 (A) 08/24/2018    Review of Glycemic Control  FBS and post-prandials elevated. Fine-tune insulin adjustments.  Inpatient Diabetes Program Recommendations:     Increase Lantus to 22 units bid Increase Novolog to 12 units tidwc.  Will continue to follow closely.  Thank you. Lorenda Peck, RD, LDN, CDE Inpatient Diabetes Coordinator [phone]

## 2018-12-20 NOTE — TOC Progression Note (Signed)
Transition of Care [facility]) - Progression Note    Patient Details  Name: Robert Gregory MRN: [id number] Date of Birth: 1941-08-21  Transition of Care [facility]) CM/SW Contact  Calyse Murcia, Juliann Pulse, RN Phone Number: 12/20/2018, 3:58 PM  Clinical Narrative:  Spoke to patient who defers to spouse-they plan to d/c home w/HHC-chose [facility] will assess & let me know if able to accept-[facility] will also screen for [place] home 1st program, may need shower chair-spouse will assess if can order online. They agree to HHRN/PT/OT/aide/social worker.Spouse states she will manage own transport home. They plan to receive otpt oncology services. Continue to monitor for d/c plans.    Expected Discharge Plan: [place] Barriers to Discharge: No Barriers Identified  Expected Discharge Plan and Services Expected Discharge Plan: [place]   Discharge Planning Services: CM Consult   Living arrangements for the past 2 months: Single Family Home                           Social Determinants of Health (SDOH) Interventions    Readmission Risk Interventions Readmission Risk Prevention Plan 12/12/2018  Transportation Screening Complete  Medication Review [employer] photographer) Complete  PCP or Specialist appointment within 3-5 days of discharge Complete  HRI or Home Care Consult Complete  SW Recovery Care/Counseling Consult Complete  Palliative Care Screening Complete  Skilled Nursing Facility Complete  Some recent data might be hidden

## 2018-12-20 NOTE — Progress Notes (Signed)
[facility] Therapy Treatment Record Phone [phone]   Radiation Therapy was administered to Robert Gregory on: 12/20/2018  12:35 PM and was treatment # 8 out of a planned course of 10 treatments.  Radiation Treatment  1). Beam photons with 6-10 energy  2). Brachytherapy None  3). Stereotactic Radiosurgery None  4). Other Radiation None     Robert Gregory A Robert Gregory, RT (T)

## 2018-12-20 NOTE — Progress Notes (Signed)
PROGRESS NOTE    Robert Gregory  KGU:[id number] DOB: 1941/07/11 DOA: 12/03/2018 PCP: Seward Carol, MD   Brief Narrative:  78 year old with past medical history relevant for coronary artery disease status post CABG, status post pacemaker placement, atrial fibrillation on rivaroxaban, stage III CKD, obstructive sleep apnea, type 2 diabetes, [name] cirrhosis, recent diagnosis of metastatic hepatocellular carcinoma with disease to thoracic spine, hypertension admitted with near syncope and found to be in complete heart block status post pacemaker adjustment.  His hospital course has been complicated by bilateral lower extremity cellulitis, AKI on CKD complicated by hyperkalemia, dysphasia found to have esophageal dysmotility, recalcitrant back pain due to T7 pathologic fracture transferred to [facility] on 12/10/2018 for initiation of radiation therapy.   Assessment & Plan:   Principal Problem:   Near syncope Active Problems:   Essential hypertension   Hyperlipidemia   Type 2 diabetes mellitus with stage 3 chronic kidney disease (HCC)   Chronic back pain   Obesity, Class III, BMI 40-49.9 (morbid obesity) (HCC)   Cellulitis of right lower extremity   Complete heart block (HCC)   Acute renal failure superimposed on stage 3 chronic kidney disease (HCC)   Hepatocellular carcinoma (HCC)  #) Metastatic hepatocellular carcinoma/recalcitrant T7 pain due to pathologic fracture: Patient has been intermittently refusing certain pain medications as he is quite sensitive to them. -Continue tramadol 25 to 50 mg every 6 hours as needed -Patient is refused very rarely intermittently used NSAIDs in the past,  -Continue fentanyl patch 25 mcg every 72 hours -Continue lidocaine patch -Continue gabapentin 400 mg 3 times daily -Oncology following, [name] -Oncology started steroids as well as given 1 dose of zoledronic acid and calcitonin -Radiation oncology following, treatment started on 12/11/2018  -Neurosurgery did not feel that there was any surgical options for the patient due to high risk of decompensation -Underwent Port-A-Cath placement on 12/19/2018.  #) Acute on chronic diastolic heart failure: Resolved after IV furosemide -Restart home oral furosemide  #) Acute encephalopathy due to medication: Resolved  #) Paroxysmal atrial fibrillation: -Continue rivaroxaban, oncology will discuss with general surgery about transitioning patient to enoxaparin subcu  -Continue beta-blocker  #) Type 2 diabetes complicated by neuropathy: Blood sugars have increased on steroids -Continue glargine 20 units twice daily -Continue aspart 10 units with meals 3 times daily -Patient is on U500 at home -Sliding scale insulin, AC at bedtime -Carb restricted diet  #) Bilateral lower extremity cellulitis: This appears to have largely resolved with a course of IV and p.o. antibiotics  #) Dysphagia: -Swallow study showed evidence of nonspecific esophageal dysmotility -Gastroenterology has signed off  #) Complete heart block status post pacemaker/near syncope: Patient initially presented with near syncope and was found to be in complete heart block.  Patient did have pacemaker changed from VVI to DDD -Continue telemetry -Cardiology signed off, appreciate recommendations  #) AKI on CKD stage III/hyperkalemia: This is resolved with IV fluids. -Avoid nephrotoxins  #) Coronary artery disease status post CABG/hypertension/hyperlipidemia: -Continue carvedilol 6.25 mg twice daily -Continue ARB  #) OSA: -Continue CPAP nightly  Fluids: Tolerating p.o. Electrolytes: Monitor and supplement Nutrition: Carb restricted diet  Disposition: Pending discharge to skilled nursing facility  DO NOT RESUSCITATE   Consultants:   Neurosurgery  Cardiology  Oncology, [name]  Radiation oncology  Gastroenterology, Sadie Haber  Physical medicine rehabilitation  Procedures:   Pacemaker transition from  VVI to DDD pacing on 12/03/2018  Esophagram 12/05/2018 nonspecific esophageal motility disorder  Antimicrobials:   P.o. cephalexin  started 12/08/2018 to 12/12/2018  IV cefazolin 12/03/2020 to 12/08/2018  IV vancomycin and Zosyn 12/03/2018 to 12/04/2018   Subjective: No acute complaints no nausea no vomiting no fever no chills no diarrhea.  Reports he is doing well today.  Objective: Vitals:   12/19/18 1648 12/19/18 2009 12/19/18 2206 12/20/18 0452  BP: 116/62  107/67 124/64  Pulse: 82  76 74  Resp:  18 20 18   Temp:   98.4 F (36.9 C) 97.8 F (36.6 C)  TempSrc:   Oral Oral  SpO2:   92% 94%  Weight:      Height:        Intake/Output Summary (Last 24 hours) at 12/20/2018 1025 Last data filed at 12/20/2018 0900 Gross per 24 hour  Intake 240 ml  Output 275 ml  Net -35 ml   Filed Weights   12/07/18 0613 12/08/18 0509 12/09/18 0600  Weight: (!) 144.6 kg (!) 143.2 kg (!) 146.3 kg    Examination:  General exam: Appears calm and comfortable  Respiratory system: No increased work of breathing, anterior lung sounds clear, no wheezes, crackles, rhonchi, diminished lung sounds at bases Cardiovascular system: Regular rate and rhythm, no murmurs Gastrointestinal system: Soft, nondistended, no rebound or guarding, plus bowel sounds. Central nervous system: Alert and oriented.  Bilateral lower extremity strength is 5 out of 5, upper cavity strength is 5 out of 5 Extremities: 1+ lower extremity edema Skin: No rashes over visible skin Psychiatry: Judgement and insight appear normal. Mood & affect appropriate.  Mildly slowed    Data Reviewed: I have personally reviewed following labs and imaging studies  CBC: Recent Labs  Lab 12/15/18 0428 12/16/18 0347 12/18/18 0523 12/19/18 0411  WBC 7.7 7.0 6.9 6.7  NEUTROABS  --   --  5.5 5.6  HGB 10.9* 11.3* 11.5* 11.5*  HCT 35.7* 37.5* 36.5* 37.0*  MCV 100.8* 101.4* 99.2 97.9  PLT 75* 70* 79* 70*   Basic Metabolic Panel: Recent Labs   Lab 12/16/18 0347 12/17/18 0511 12/18/18 0523 12/19/18 0411 12/20/18 0440  NA 137 139 137  138 137 136  K 5.2* 4.9 4.2  4.2 4.3 4.4  CL 100 97* 95*  95* 96* 96*  CO2 29 33* 31  32 31 31  GLUCOSE 206* 241* 188*  194* 227* 225*  BUN 37* 39* 41*  42* 41* 45*  CREATININE 0.77 0.92 0.95  0.98 0.93 0.96  CALCIUM 9.3 9.4 9.0  9.2 9.1 9.1  MG  --   --  2.3  --   --    GFR: Estimated Creatinine Clearance: 97.1 mL/min (by C-G formula based on SCr of 0.96 mg/dL). Liver Function Tests: Recent Labs  Lab 12/18/18 0523  AST 131*  ALT 171*  ALKPHOS 277*  BILITOT 1.5*  PROT 6.3*  ALBUMIN 3.5   No results for input(s): LIPASE, AMYLASE in the last 168 hours. No results for input(s): AMMONIA in the last 168 hours. Coagulation Profile: Recent Labs  Lab 12/19/18 0411  INR 1.2   Cardiac Enzymes: No results for input(s): CKTOTAL, CKMB, CKMBINDEX, TROPONINI in the last 168 hours. BNP (last 3 results) No results for input(s): PROBNP in the last 8760 hours. HbA1C: No results for input(s): HGBA1C in the last 72 hours. CBG: Recent Labs  Lab 12/19/18 0839 12/19/18 1150 12/19/18 1647 12/19/18 2203 12/20/18 0803  GLUCAP 163* 144* 306* 345* 161*   Lipid Profile: No results for input(s): CHOL, HDL, LDLCALC, TRIG, CHOLHDL, LDLDIRECT in the last 72 hours. Thyroid  Function Tests: No results for input(s): TSH, T4TOTAL, FREET4, T3FREE, THYROIDAB in the last 72 hours. Anemia Panel: No results for input(s): VITAMINB12, FOLATE, FERRITIN, TIBC, IRON, RETICCTPCT in the last 72 hours. Sepsis Labs: No results for input(s): PROCALCITON, LATICACIDVEN in the last 168 hours.  No results found for this or any previous visit (from the past 240 hour(s)).       Radiology Studies: Ir Imaging Guided Port Insertion  Result Date: 12/19/2018 CLINICAL DATA:  Metastatic hepatocellular carcinoma and need for porta cath for further therapy. EXAM: IMPLANTED PORT A CATH PLACEMENT WITH ULTRASOUND AND  FLUOROSCOPIC GUIDANCE ANESTHESIA/SEDATION: 2.0 mg IV Versed; 100 mcg IV Fentanyl Total Moderate Sedation Time:  41 minutes The patient's level of consciousness and physiologic status were continuously monitored during the procedure by Radiology nursing. Additional Medications: 2 g IV Ancef. FLUOROSCOPY TIME:  42 seconds.  42.0 mGy. PROCEDURE: The procedure, risks, benefits, and alternatives were explained to the patient. Questions regarding the procedure were encouraged and answered. The patient understands and consents to the procedure. A time-out was performed prior to initiating the procedure. Ultrasound was utilized to confirm patency of the right internal jugular vein. The right neck and chest were prepped with chlorhexidine in a sterile fashion, and a sterile drape was applied covering the operative field. Maximum barrier sterile technique with sterile gowns and gloves were used for the procedure. Local anesthesia was provided with 1% lidocaine. After creating a small venotomy incision, a 21 gauge needle was advanced into the right internal jugular vein under direct, real-time ultrasound guidance. Ultrasound image documentation was performed. After securing guidewire access, an 8 Fr dilator was placed. A J-wire was kinked to measure appropriate catheter length. A subcutaneous port pocket was then created along the upper chest wall utilizing sharp and blunt dissection. Portable cautery was utilized. The pocket was irrigated with sterile saline. A single lumen power injectable port was chosen for placement. The 8 Fr catheter was tunneled from the port pocket site to the venotomy incision. The port was placed in the pocket. External catheter was trimmed to appropriate length based on guidewire measurement. At the venotomy, an 8 Fr peel-away sheath was placed over a guidewire. The catheter was then placed through the sheath and the sheath removed. Final catheter positioning was confirmed and documented with a  fluoroscopic spot image. The port was accessed with a needle and aspirated and flushed with heparinized saline. The access needle was removed. The venotomy and port pocket incisions were closed with subcutaneous 3-0 Monocryl and subcuticular 4-0 Vicryl. Dermabond was applied to both incisions. COMPLICATIONS: COMPLICATIONS None FINDINGS: After catheter placement, the tip lies at the cavo-atrial junction. The catheter aspirates normally and is ready for immediate use. IMPRESSION: Placement of single lumen port a cath via right internal jugular vein. The catheter tip lies at the cavo-atrial junction. A power injectable port a cath was placed and is ready for immediate use. Electronically Signed   By: Aletta Edouard M.D.   On: 12/19/2018 11:25        Scheduled Meds: . calcitonin (salmon)  1 spray Alternating Nares Daily  . carvedilol  6.25 mg Oral BID WC  . cholecalciferol  2,000 Units Oral Q supper  . dexamethasone  4 mg Oral Daily  . ezetimibe  10 mg Oral Daily  . feeding supplement (GLUCERNA SHAKE)  237 mL Oral TID BM  . feeding supplement (PRO-STAT SUGAR FREE 64)  30 mL Oral TID BM  . fenofibrate  160 mg Oral Daily  .  fentaNYL  1 patch Transdermal Q72H  . gabapentin  400 mg Oral TID  . insulin aspart  0-9 Units Subcutaneous TID WC  . insulin aspart  10 Units Subcutaneous TID WC  . insulin glargine  20 Units Subcutaneous BID  . irbesartan  300 mg Oral Daily  . lidocaine  1 patch Transdermal Q24H  . multivitamin with minerals  1 tablet Oral Daily  . nystatin  5 mL Oral 6 X Daily  . omega-3 acid ethyl esters  2 g Oral BID  . polyethylene glycol  17 g Oral BID  . pravastatin  80 mg Oral QHS  . [START ON 12/21/2018] rivaroxaban  20 mg Oral Q supper  . saccharomyces boulardii  250 mg Oral Daily  . senna-docusate  2 tablet Oral BID  . sodium bicarbonate/sodium chloride   Mouth Rinse QID  . sodium chloride flush  3 mL Intravenous Q12H  . spironolactone  25 mg Oral Daily   Continuous  Infusions:   LOS: 17 days    Time spent: [place], MD [facility] If 7PM-7AM, please contact night-coverage [email or web address] Password [facility] 12/20/2018, 10:25 AM

## 2018-12-20 NOTE — Progress Notes (Signed)
Physical Therapy Treatment Patient Details Name: Robert Gregory MRN: [id number] DOB: [birth year] Today's Date: 12/20/2018    History of Present Illness Pt is a 78 y/o male admitted 12/03/18 with dizziness, bradycardia and near syncope. Pt with complete heart block and metastatic hepatocellular carinoma with T7 compression fx. Not a candidate for verterbroplasty, to begin palliative radiation at [facility] 3/20. PMH includes DM, MI, ICD, HTN, HLD, A-fib.    PT Comments    Pt assisted with ambulating in hallway and tolerating improved distance however does require standing rest breaks.  Pt anticipates d/c home Friday and states plan is for HHPT.  Follow Up Recommendations  Home health PT;Supervision for mobility/OOB     Equipment Recommendations  None recommended by PT    Recommendations for Other Services       Precautions / Restrictions Precautions Precautions: Fall;Back Precaution Comments: pt does not wish to use brace    Mobility  Bed Mobility Overal bed mobility: Needs Assistance Bed Mobility: Rolling;Sidelying to Sit;Sit to Sidelying Rolling: Supervision Sidelying to sit: Supervision     Sit to sidelying: Min assist General bed mobility comments: recalls log roll technique, self assists with bed rail, assist for feet onto bed per pt request  Transfers Overall transfer level: Needs assistance Equipment used: Rolling walker (2 wheeled) Transfers: Sit to/from Stand Sit to Stand: Supervision Stand pivot transfers: Supervision       General transfer comment: verbal cues for use of LEs to assist (not bending at back)  Ambulation/Gait Ambulation/Gait assistance: Supervision Gait Distance (Feet): 300 Feet Assistive device: Rolling walker (2 wheeled) Gait Pattern/deviations: Step-through pattern;Decreased stride length;Trunk flexed     General Gait Details: verbal cues for safety especially with turns, no LOB, 5 brief standing rest breaks    Stairs              Wheelchair Mobility    Modified Rankin (Stroke Patients Only)       Balance   Sitting-balance support: Feet supported;No upper extremity supported Sitting balance-Leahy Scale: Good     Standing balance support: Bilateral upper extremity supported Standing balance-Leahy Scale: Fair Standing balance comment: able to static stand without LOB                             Cognition Arousal/Alertness: Awake/alert Behavior During Therapy: WFL for tasks assessed/performed Overall Cognitive Status: Within Functional Limits for tasks assessed                                 General Comments: pt in good spirits and pleasant       Exercises      General Comments        Pertinent Vitals/Pain Pain Assessment: No/denies pain Faces Pain Scale: No hurt Pain Location: back  Pain Intervention(s): Premedicated before session;Monitored during session;Repositioned    Home Living                      Prior Function            PT Goals (current goals can now be found in the care plan section) Acute Rehab PT Goals Patient Stated Goal: home, hopefully Friday PT Goal Formulation: With patient Time For Goal Achievement: 12/27/18 Potential to Achieve Goals: Good Progress towards PT goals: Progressing toward goals    Frequency    Min 3X/week  PT Plan Current plan remains appropriate    Co-evaluation              AM-PAC PT "6 Clicks" Mobility   Outcome Measure  Help needed turning from your back to your side while in a flat bed without using bedrails?: A Little Help needed moving from lying on your back to sitting on the side of a flat bed without using bedrails?: A Little Help needed moving to and from a bed to a chair (including a wheelchair)?: A Little Help needed standing up from a chair using your arms (e.g., wheelchair or bedside chair)?: A Little Help needed to walk in hospital room?: A Little Help needed climbing 3-5  steps with a railing? : A Little 6 Click Score: 18    End of Session Equipment Utilized During Treatment: Gait belt Activity Tolerance: Patient tolerated treatment well Patient left: in bed;with call bell/phone within reach Nurse Communication: Mobility status PT Visit Diagnosis: Muscle weakness (generalized) (M62.81);Unsteadiness on feet (R26.81)     Time: 9800-1239 PT Time Calculation (min) (ACUTE ONLY): 17 min  Charges:  $Gait Training: 8-22 mins                     Carmelia Bake, PT, DPT Acute Rehabilitation Services Office: [phone] Pager: [phone]  Trena Platt 12/20/2018, 4:59 PM

## 2018-12-20 NOTE — Progress Notes (Addendum)
Inpatient Rehabilitation-Admissions Coordinator   Spoke with pt and his wife via phone. Noted great improvement in mobility with therapies recently. Pt no longer requires an IP Rehab stay as he is supervision for transfers and ambulating 300 feet. Pt and his wife verbalized understanding of all information and aware of the recommendation for [facility]. AC will sign off.  Please call if questions.   Jhonnie Garner, OTR/L  Rehab Admissions Coordinator  [phone] 12/20/2018 6:03 PM

## 2018-12-20 NOTE — Progress Notes (Signed)
Mr. Hemmelgarn actually has a Port-A-Cath placed yesterday.  I appreciate interventional radiology's help with this.  He is now off the Lovenox and back onto Xarelto.  He is eating a little bit better.  He is eating sitting up now.  He is able to transfer a little bit better.  It looks like the cellulitis is healed on the right lower leg.  He has had no cough.  He has had no thrush.  Radiation really has helped quite a bit.  He has had no fever.  He has had no nausea or vomiting.  There is been no issues with bowels or bladder.  On his physical exam, his vital signs are temperature 97.8.  Pulse 74.  Blood pressure 124/64.  His head neck exam shows no scleral icterus.  He has no thrush in the oral cavity.  Lungs are clear bilaterally.  Cardiac exam regular rate and rhythm with no murmurs, rubs or bruits.  Abdomen is morbidly obese.  Abdomen is soft.  Bowel sounds are present.  Extremities shows no clubbing, cyanosis or edema.  Again, Mr. Fredin has metastatic hepatocellular carcinoma.  He is getting radiation therapy for a pathologic fracture at T7/T8.  He is improving nicely.  He is more functional.  Pain is much better controlled.  I would like to think that he will be able to go home once he finishes his radiation on Friday.  Now that he has the Port-A-Cath in, we will consider him for outpatient systemic therapy with Tecentriq/Avastin.  I probably would give him a week off after radiation before I would start any treatment.  Again, he is incredibly impressed by the nursing staff on 4 [facility].  He really is grateful for the care that he is getting.  It is no surprise to me that he is getting great care as the nurses and all the staff on [facility]. are so compassionate and they know that what they do is God's work.   Lattie Haw, MD  [place] 19:26

## 2018-12-20 NOTE — Progress Notes (Signed)
Occupational Therapy Treatment Patient Details Name: Robert Gregory MRN: [id number] DOB: 1941/07/16 Today's Date: 12/20/2018    History of present illness Pt is a 78 y/o male admitted 12/03/18 with dizziness, bradycardia and near syncope. Pt with complete heart block and metastatic hepatocellular carinoma with T7 compression fx. Not a candidate for verterbroplasty, to begin palliative radiation at [facility] 3/20. PMH includes DM, MI, ICD, HTN, HLD, A-fib.   OT comments  DC plan updated to [facility]   Follow Up Recommendations  Supervision/Assistance - 24 hour;Home health OT    Equipment Recommendations  Other (comment)    Recommendations for Other Services      Precautions / Restrictions Precautions Precautions: Fall;Back Precaution Comments: pt opting against use of brace (for comfort) - pt states MD stated this was okay. Reviewed back precautions with pt this session to protect spine (no bending, twisting, arching spine; no lifting). Watch sats with mobility       Mobility Bed Mobility Overal bed mobility: Needs Assistance Bed Mobility: Rolling Rolling: Min guard;Supervision Sidelying to sit: Supervision          Transfers Overall transfer level: Needs assistance Equipment used: Rolling walker (2 wheeled) Transfers: Sit to/from [facility] Sit to Stand: Supervision Stand pivot transfers: Supervision       General transfer comment: cues for hand placement    Balance   Sitting-balance support: Feet supported;No upper extremity supported Sitting balance-Leahy Scale: Good     Standing balance support: Bilateral upper extremity supported Standing balance-Leahy Scale: Fair Standing balance comment: able to static stand without LOB                            ADL either performed or assessed with clinical judgement   ADL Overall ADL's : Needs assistance/impaired Eating/Feeding: Independent;Sitting   Grooming: Standing;Wash/dry  face;Supervision/safety   Upper Body Bathing: Set up;Sitting   Lower Body Bathing: Moderate assistance;Sit to/from stand   Upper Body Dressing : Set up;Sitting   Lower Body Dressing: Sit to/from stand;Minimal assistance;Cueing for sequencing;Cueing for safety   Toilet Transfer: Minimal assistance;RW             General ADL Comments: wife can A at home as needed.       Vision Baseline Vision/History: No visual deficits            Cognition Arousal/Alertness: Awake/alert Behavior During Therapy: WFL for tasks assessed/performed Overall Cognitive Status: Within Functional Limits for tasks assessed                                 General Comments: pt in good spirits and pleasant with PT.                    Pertinent Vitals/ Pain       Faces Pain Scale: No hurt Pain Location: back          Frequency  Min 2X/week        Progress Toward Goals  OT Goals(current goals can now be found in the care plan section)  Progress towards OT goals: Progressing toward goals     Plan Discharge plan needs to be updated    Co-evaluation                 AM-PAC OT "6 Clicks" Daily Activity     Outcome Measure   Help from another person  eating meals?: None Help from another person taking care of personal grooming?: None Help from another person toileting, which includes using toliet, bedpan, or urinal?: A Little Help from another person bathing (including washing, rinsing, drying)?: A Little Help from another person to put on and taking off regular upper body clothing?: None Help from another person to put on and taking off regular lower body clothing?: A Little 6 Click Score: 21    End of Session Equipment Utilized During Treatment: Gait belt;Rolling walker  OT Visit Diagnosis: Unsteadiness on feet (R26.81);Other abnormalities of gait and mobility (R26.89);Muscle weakness (generalized) (M62.81);Pain   Activity Tolerance Patient tolerated  treatment well   Patient Left with call bell/phone within reach;in chair;with chair alarm set;with nursing/sitter in room(pt going for his radiation)   Nurse Communication Mobility status        Time: 4076-8088 OT Time Calculation (min): 29 min  Charges: OT General Charges $OT Visit: 1 Visit OT Treatments $Self Care/Home Management : 23-37 mins  Kari Baars, [place] [phone] Office- [phone]      Damary Doland, Edwena Felty D 12/20/2018, 1:56 PM

## 2018-12-21 ENCOUNTER — Ambulatory Visit: Payer: Medicare Other

## 2018-12-21 ENCOUNTER — Inpatient Hospital Stay (HOSPITAL_COMMUNITY): Payer: Medicare Other

## 2018-12-21 LAB — COMPREHENSIVE METABOLIC PANEL
ALT: 211 U/L — ABNORMAL HIGH (ref 0–44)
AST: 163 U/L — ABNORMAL HIGH (ref 15–41)
Albumin: 3.7 g/dL (ref 3.5–5.0)
Alkaline Phosphatase: 337 U/L — ABNORMAL HIGH (ref 38–126)
Anion gap: 11 (ref 5–15)
BUN: 40 mg/dL — ABNORMAL HIGH (ref 8–23)
CO2: 28 mmol/L (ref 22–32)
Calcium: 9.3 mg/dL (ref 8.9–10.3)
Chloride: 95 mmol/L — ABNORMAL LOW (ref 98–111)
Creatinine, Ser: 0.92 mg/dL (ref 0.61–1.24)
GFR calc Af Amer: >60 mL/min (ref >60)
GFR calc non Af Amer: >60 mL/min (ref >60)
Glucose, Bld: 183 mg/dL — ABNORMAL HIGH (ref 70–99)
Potassium: 5 mmol/L (ref 3.5–5.1)
Sodium: 134 mmol/L — ABNORMAL LOW (ref 135–145)
Total Bilirubin: 2.2 mg/dL — ABNORMAL HIGH (ref 0.3–1.2)
Total Protein: 7.1 g/dL (ref 6.5–8.1)

## 2018-12-21 LAB — BLOOD CULTURE ID PANEL (REFLEXED)

## 2018-12-21 LAB — BLOOD GAS, ARTERIAL
Acid-Base Excess: 5.9 mmol/L — ABNORMAL HIGH (ref 0.0–2.0)
Bicarbonate: 29.8 mmol/L — ABNORMAL HIGH (ref 20.0–28.0)
O2 Content: 2 L/min
O2 Saturation: 96.4 %
Patient temperature: 100.3
pCO2 arterial: 43.4 mmHg (ref 32.0–48.0)
pH, Arterial: 7.455 — ABNORMAL HIGH (ref 7.350–7.450)
pO2, Arterial: 85.7 mmHg (ref 83.0–108.0)

## 2018-12-21 LAB — CBC
HCT: 40.7 % (ref 39.0–52.0)
Hemoglobin: 12.8 g/dL — ABNORMAL LOW (ref 13.0–17.0)
MCH: 30.9 pg (ref 26.0–34.0)
MCHC: 31.4 g/dL (ref 30.0–36.0)
MCV: 98.3 fL (ref 80.0–100.0)
Platelets: 57 10*3/uL — ABNORMAL LOW (ref 150–400)
RBC: 4.14 MIL/uL — ABNORMAL LOW (ref 4.22–5.81)
RDW: 19.9 % — ABNORMAL HIGH (ref 11.5–15.5)
WBC: 7.5 10*3/uL (ref 4.0–10.5)
nRBC: 0.3 % — ABNORMAL HIGH (ref 0.0–0.2)

## 2018-12-21 LAB — URINALYSIS, ROUTINE W REFLEX MICROSCOPIC
Bilirubin Urine: NEGATIVE
Glucose, UA: 50 mg/dL — AB
Ketones, ur: NEGATIVE mg/dL
Leukocytes,Ua: NEGATIVE
Nitrite: NEGATIVE
Protein, ur: NEGATIVE mg/dL
Specific Gravity, Urine: 1.017 (ref 1.005–1.030)
pH: 8 (ref 5.0–8.0)

## 2018-12-21 LAB — GLUCOSE, CAPILLARY
Glucose-Capillary: 123 mg/dL — ABNORMAL HIGH (ref 70–99)
Glucose-Capillary: 78 mg/dL (ref 70–99)
Glucose-Capillary: 122 mg/dL — ABNORMAL HIGH (ref 70–99)
Glucose-Capillary: 165 mg/dL — ABNORMAL HIGH (ref 70–99)

## 2018-12-21 LAB — PROCALCITONIN: Procalcitonin: 0.2 ng/mL

## 2018-12-21 LAB — MRSA PCR SCREENING: MRSA by PCR: NEGATIVE

## 2018-12-21 MED ORDER — ORAL CARE MOUTH RINSE
15.0000 mL | Freq: Two times a day (BID) | OROMUCOSAL | Status: DC
  Administered 2018-12-21 – 2018-12-28 (×7): 15 mL via OROMUCOSAL

## 2018-12-21 MED ORDER — NALOXONE HCL 2 MG/2ML IJ SOSY
1.0000 mg | PREFILLED_SYRINGE | INTRAMUSCULAR | Status: DC | PRN
  Filled 2018-12-21: qty 2

## 2018-12-21 MED ORDER — VANCOMYCIN HCL 10 G IV SOLR
2000.0000 mg | Freq: Once | INTRAVENOUS | Status: AC
  Administered 2018-12-21: 14:00:00 2000 mg via INTRAVENOUS
  Filled 2018-12-21: qty 2000

## 2018-12-21 MED ORDER — VANCOMYCIN HCL 10 G IV SOLR
1250.0000 mg | Freq: Two times a day (BID) | INTRAVENOUS | Status: DC
  Administered 2018-12-21: 1250 mg via INTRAVENOUS
  Filled 2018-12-21: qty 1250

## 2018-12-21 MED ORDER — SODIUM CHLORIDE 0.9 % IV SOLN
2.0000 g | Freq: Three times a day (TID) | INTRAVENOUS | Status: DC
  Administered 2018-12-21 (×2): 2 g via INTRAVENOUS
  Filled 2018-12-21 (×4): qty 2

## 2018-12-21 MED ORDER — HYDROCORTISONE NA SUCCINATE PF 100 MG IJ SOLR
50.0000 mg | Freq: Four times a day (QID) | INTRAMUSCULAR | Status: DC
  Administered 2018-12-21 – 2018-12-23 (×8): 50 mg via INTRAVENOUS
  Filled 2018-12-21 (×8): qty 2

## 2018-12-21 MED ORDER — SODIUM CHLORIDE 0.9% FLUSH
10.0000 mL | Freq: Two times a day (BID) | INTRAVENOUS | Status: DC
  Administered 2018-12-21 – 2018-12-28 (×5): 10 mL

## 2018-12-21 MED ORDER — SODIUM CHLORIDE 0.9% FLUSH
10.0000 mL | INTRAVENOUS | Status: DC | PRN
  Administered 2018-12-28: 10 mL
  Filled 2018-12-21: qty 40

## 2018-12-21 MED ORDER — NOREPINEPHRINE 4 MG/250ML-% IV SOLN
0.0000 ug/min | INTRAVENOUS | Status: DC
  Administered 2018-12-21: 13:00:00 2 ug/min via INTRAVENOUS
  Administered 2018-12-21: 21:00:00 5 ug/min via INTRAVENOUS
  Filled 2018-12-21 (×2): qty 250

## 2018-12-21 MED ORDER — CHLORHEXIDINE GLUCONATE CLOTH 2 % EX PADS
6.0000 | MEDICATED_PAD | Freq: Every day | CUTANEOUS | Status: DC
  Administered 2018-12-21 – 2018-12-22 (×2): 6 via TOPICAL

## 2018-12-21 MED ORDER — SODIUM CHLORIDE 0.9 % IV BOLUS
1000.0000 mL | Freq: Once | INTRAVENOUS | Status: AC
  Administered 2018-12-21: 1000 mL via INTRAVENOUS

## 2018-12-21 MED ORDER — NALOXONE HCL 0.4 MG/ML IJ SOLN
INTRAMUSCULAR | Status: AC
  Administered 2018-12-21: 0.4 mg
  Filled 2018-12-21: qty 1

## 2018-12-21 NOTE — Progress Notes (Deleted)
[place] yellow vital signs guidelines: TMAX 102.5 oral BP 145/59 MAP 86 HR 105 SPO2 99 on CPAP Charge nurse and attending notified, new orders received.

## 2018-12-21 NOTE — Progress Notes (Signed)
Pharmacy Antibiotic Note  Robert Gregory is a 78 y.o. male admitted on 12/03/2018 and completed antibiotic treatment for cellulitis.  He now has new symptoms of sepsis, Fever of unknown origin.  Pharmacy has been consulted for Vancomycin dosing.  Plan:  Cefepime 2g IV q8h per MD.  Vancomycin 2g IV x1 then 127m IV q12h.  Goal AUC 400-550.  Expected AUC: 506 using SCr 0.92   Follow up renal fxn, culture results, and clinical course.    Height: 6' 1"  (185.4 cm) Weight: (!) 322 lb 8.5 oz (146.3 kg) IBW/kg (Calculated) : 79.9  Temp (24hrs), Avg:100.6 F (38.1 C), Min:98 F (36.7 C), Max:102.9 F (39.4 C)  Recent Labs  Lab 12/15/18 0428 12/16/18 0347 12/17/18 0511 12/18/18 0523 12/19/18 0411 12/20/18 0440 12/21/18 0418  WBC 7.7 7.0  --  6.9 6.7  --  7.5  CREATININE 0.72 0.77 0.92 0.95  0.98 0.93 0.96 0.92    Estimated Creatinine Clearance: 101.3 mL/min (by C-G formula based on SCr of 0.92 mg/dL).    Allergies  Allergen Reactions  . Feraheme [Ferumoxytol] Other (See Comments)    Patient had  reaction during infusion in Feb 2020 [facility]  . Contrast Media [Iodinated Diagnostic Agents] Rash and Other (See Comments)    Extreme flushing  . Iodine Other (See Comments)    flushing  . Ioxaglate Other (See Comments)    Extreme flushing  . Adhesive [Tape] Rash    Adhesive tapes causes redness/itching/rash after removal  . Seasonal Ic [Cholestatin] Other (See Comments)    Unknown reaction    Antimicrobials this admission: 3/15>> Vanc >>3/16 3/15 >> Zosyn >>3/16 3/16 >> Cefazolin >>3/20 3/20 >> Keflex >> 3/27 3/31 >> Cefazolin >> 3/31 4/2 >> Cefepime >> 4/2 >> Vanc >>   Dose adjustments this admission:    Microbiology results:  4/2 BCx: 4/2 UCx:   Thank you for allowing pharmacy to be a part of this patient's care.  CGretta ArabPharmD, BCPS 12/21/2018 11:25 AM

## 2018-12-21 NOTE — Progress Notes (Signed)
PHARMACY - PHYSICIAN COMMUNICATION CRITICAL VALUE ALERT - BLOOD CULTURE IDENTIFICATION (BCID)  Robert Gregory is an 78 y.o. male who presented to [facility] on 12/03/2018 with a chief complaint of fever, sepis.  Assessment:  FUO, sepsis (include suspected source if known) 4 of 4 bottles entercoccus with no resistance Name of physician (or Provider) Contacted: Lamar Blinks, NP  Current antibiotics: Cefepime and Vancomycin  Changes to prescribed antibiotics recommended:  Recommendations accepted by provider  Change to Ampicillin 2 Gm IV q4h per BCID algorithm  Results for orders placed or performed during the hospital encounter of 12/03/18  Blood Culture ID Panel (Reflexed) (Collected: 12/21/2018  7:51 AM)  Result Value Ref Range   Enterococcus species DETECTED (A) NOT DETECTED   Vancomycin resistance NOT DETECTED NOT DETECTED   Listeria monocytogenes NOT DETECTED NOT DETECTED   Staphylococcus species NOT DETECTED NOT DETECTED   Staphylococcus aureus (BCID) NOT DETECTED NOT DETECTED   Streptococcus species NOT DETECTED NOT DETECTED   Streptococcus agalactiae NOT DETECTED NOT DETECTED   Streptococcus pneumoniae NOT DETECTED NOT DETECTED   Streptococcus pyogenes NOT DETECTED NOT DETECTED   Acinetobacter baumannii NOT DETECTED NOT DETECTED   Enterobacteriaceae species NOT DETECTED NOT DETECTED   Enterobacter cloacae complex NOT DETECTED NOT DETECTED   Escherichia coli NOT DETECTED NOT DETECTED   Klebsiella oxytoca NOT DETECTED NOT DETECTED   Klebsiella pneumoniae NOT DETECTED NOT DETECTED   Proteus species NOT DETECTED NOT DETECTED   Serratia marcescens NOT DETECTED NOT DETECTED   Haemophilus influenzae NOT DETECTED NOT DETECTED   Neisseria meningitidis NOT DETECTED NOT DETECTED   Pseudomonas aeruginosa NOT DETECTED NOT DETECTED   Candida albicans NOT DETECTED NOT DETECTED   Candida glabrata NOT DETECTED NOT DETECTED   Candida krusei NOT DETECTED NOT DETECTED   Candida parapsilosis NOT  DETECTED NOT DETECTED   Candida tropicalis NOT DETECTED NOT DETECTED    Dorrene German 12/21/2018  11:53 PM

## 2018-12-21 NOTE — Progress Notes (Signed)
PROGRESS NOTE    Robert Gregory  CBS:[id number] DOB: [birth year] DOA: 12/03/2018 PCP: Seward Carol, MD   Brief Narrative:  78 year old with past medical history relevant for coronary artery disease status post CABG, status post pacemaker placement, atrial fibrillation on rivaroxaban, stage III CKD, obstructive sleep apnea, type 2 diabetes, [name] cirrhosis, recent diagnosis of metastatic hepatocellular carcinoma with disease to thoracic spine, hypertension admitted with near syncope and found to be in complete heart block status post pacemaker adjustment.  His hospital course has been complicated by bilateral lower extremity cellulitis, AKI on CKD complicated by hyperkalemia, dysphasia found to have esophageal dysmotility, recalcitrant back pain due to T7 pathologic fracture transferred to [facility] on 12/10/2018 for initiation of radiation therapy.   Assessment & Plan:   Principal Problem:   Near syncope Active Problems:   Essential hypertension   Hyperlipidemia   Type 2 diabetes mellitus with stage 3 chronic kidney disease (HCC)   Chronic back pain   Obesity, Class III, BMI 40-49.9 (morbid obesity) (HCC)   Cellulitis of right lower extremity   Complete heart block (HCC)   Acute renal failure superimposed on stage 3 chronic kidney disease ([place])   Hepatocellular carcinoma (HCC)  #) Fever: Last night patient had fever to 102.9 with no clear source.  Unfortunately he has been hospitalized for tremendous period of time is at high risk for complications in the hospital. -Hold antibiotics as patient is clinically stable - We will order blood cultures, UA, urine cultures -We will order chest x-ray -Procalcitonin ordered  #) Metastatic hepatocellular carcinoma/recalcitrant T7 pain due to pathologic fracture: Patient has been intermittently refusing certain pain medications as he is quite sensitive to them. -Continue tramadol 25 to 50 mg every 6 hours as needed -Patient is refused very rarely  intermittently used NSAIDs in the past,  -Continue fentanyl patch 25 mcg every 72 hours -Continue lidocaine patch -Continue gabapentin 400 mg 3 times daily -Oncology following, [name] -Oncology started steroids as well as given 1 dose of zoledronic acid and calcitonin -Radiation oncology following, treatment started on 12/11/2018 -Neurosurgery did not feel that there was any surgical options for the patient due to high risk of decompensation -Underwent Port-A-Cath placement on 12/19/2018.  #) Acute on chronic diastolic heart failure: Resolved after IV furosemide -Continue home oral furosemide  #) Acute encephalopathy due to medication: Resolved  #) Paroxysmal atrial fibrillation: -Continue rivaroxaban, oncology will discuss with general surgery about transitioning patient to enoxaparin subcu  -Continue beta-blocker  #) Type 2 diabetes complicated by neuropathy: Blood sugars have increased on steroids -Continue glargine 20 units twice daily -Continue aspart 10 units with meals 3 times daily -Patient is on U500 at home -Sliding scale insulin, AC at bedtime -Carb restricted diet  #) Bilateral lower extremity cellulitis: This appears to have largely resolved with a course of IV and p.o. antibiotics  #) Dysphagia: -Swallow study showed evidence of nonspecific esophageal dysmotility -Gastroenterology has signed off  #) Complete heart block status post pacemaker/near syncope: Patient initially presented with near syncope and was found to be in complete heart block.  Patient did have pacemaker changed from VVI to DDD -Continue telemetry -Cardiology signed off, appreciate recommendations  #) AKI on CKD stage III/hyperkalemia: This is resolved with IV fluids. -Avoid nephrotoxins  #) Coronary artery disease status post CABG/hypertension/hyperlipidemia: -Continue carvedilol 6.25 mg twice daily -Continue ARB  #) OSA: -Continue CPAP nightly  Fluids: Tolerating p.o. Electrolytes:  Monitor and supplement Nutrition: Carb restricted diet  Disposition: Pending  discharge to skilled nursing facility  DO NOT RESUSCITATE   Consultants:   Neurosurgery  Cardiology  Oncology, [name]  Radiation oncology  Gastroenterology, Sadie Haber  Physical medicine rehabilitation  Procedures:   Pacemaker transition from VVI to DDD pacing on 12/03/2018  Esophagram 12/05/2018 nonspecific esophageal motility disorder  Antimicrobials:   P.o. cephalexin started 12/08/2018 to 12/12/2018  IV cefazolin 12/03/2020 to 12/08/2018  IV vancomycin and Zosyn 12/03/2018 to 12/04/2018   Subjective: Patient is awakening but is currently on his nasal BiPAP.  He reports he is feeling well.  He reports he did have a fever but otherwise felt well.  He is not had any nausea, vomiting, diarrhea, cough, congestion, rhinorrhea.  Objective: Vitals:   12/20/18 0452 12/20/18 1403 12/20/18 2152 12/21/18 0643  BP: 124/64 103/60 (!) 105/55 (!) 145/59  Pulse: 74 88 71 (!) 105  Resp: 18 20 18 20   Temp: 97.8 F (36.6 C) 98.6 F (37 C) 98 F (36.7 C) (!) 102.5 F (39.2 C)  TempSrc: Oral Oral Oral Oral  SpO2: 94% 93% 98% 99%  Weight:      Height:        Intake/Output Summary (Last 24 hours) at 12/21/2018 8768 Last data filed at 12/21/2018 0558 Gross per 24 hour  Intake 1200 ml  Output 1950 ml  Net -750 ml   Filed Weights   12/07/18 0613 12/08/18 0509 12/09/18 0600  Weight: (!) 144.6 kg (!) 143.2 kg (!) 146.3 kg    Examination:  General exam: Appears calm and comfortable  Respiratory system: No increased work of breathing, anterior lung sounds clear, no wheezes, crackles, rhonchi, diminished lung sounds at bases Cardiovascular system: Regular rate and rhythm, no murmurs Gastrointestinal system: Soft, nondistended, no rebound or guarding, plus bowel sounds. Central nervous system: Alert and oriented.  Bilateral lower extremity strength is 5 out of 5, upper cavity strength is 5 out of  5 Extremities: 1+ lower extremity edema Skin: No rashes over visible skin Psychiatry: Judgement and insight appear normal. Mood & affect appropriate.  Mildly slowed    Data Reviewed: I have personally reviewed following labs and imaging studies  CBC: Recent Labs  Lab 12/15/18 0428 12/16/18 0347 12/18/18 0523 12/19/18 0411 12/21/18 0418  WBC 7.7 7.0 6.9 6.7 7.5  NEUTROABS  --   --  5.5 5.6  --   HGB 10.9* 11.3* 11.5* 11.5* 12.8*  HCT 35.7* 37.5* 36.5* 37.0* 40.7  MCV 100.8* 101.4* 99.2 97.9 98.3  PLT 75* 70* 79* 70* 57*   Basic Metabolic Panel: Recent Labs  Lab 12/17/18 0511 12/18/18 0523 12/19/18 0411 12/20/18 0440 12/21/18 0418  NA 139 137   138 137 136 134*  K 4.9 4.2   4.2 4.3 4.4 5.0  CL 97* 95*   95* 96* 96* 95*  CO2 33* 31   32 31 31 28   GLUCOSE 241* 188*   194* 227* 225* 183*  BUN 39* 41*   42* 41* 45* 40*  CREATININE 0.92 0.95   0.98 0.93 0.96 0.92  CALCIUM 9.4 9.0   9.2 9.1 9.1 9.3  MG  --  2.3  --   --   --    GFR: Estimated Creatinine Clearance: 101.3 mL/min (by C-G formula based on SCr of 0.92 mg/dL). Liver Function Tests: Recent Labs  Lab 12/18/18 0523 12/21/18 0418  AST 131* 163*  ALT 171* 211*  ALKPHOS 277* 337*  BILITOT 1.5* 2.2*  PROT 6.3* 7.1  ALBUMIN 3.5 3.7   No results  for input(s): LIPASE, AMYLASE in the last 168 hours. No results for input(s): AMMONIA in the last 168 hours. Coagulation Profile: Recent Labs  Lab 12/19/18 0411  INR 1.2   Cardiac Enzymes: No results for input(s): CKTOTAL, CKMB, CKMBINDEX, TROPONINI in the last 168 hours. BNP (last 3 results) No results for input(s): PROBNP in the last 8760 hours. HbA1C: No results for input(s): HGBA1C in the last 72 hours. CBG: Recent Labs  Lab 12/20/18 0803 12/20/18 1139 12/20/18 1649 12/20/18 2152 12/21/18 0746  GLUCAP 161* 193* 185* 285* 123*   Lipid Profile: No results for input(s): CHOL, HDL, LDLCALC, TRIG, CHOLHDL, LDLDIRECT in the last 72 hours. Thyroid  Function Tests: No results for input(s): TSH, T4TOTAL, FREET4, T3FREE, THYROIDAB in the last 72 hours. Anemia Panel: No results for input(s): VITAMINB12, FOLATE, FERRITIN, TIBC, IRON, RETICCTPCT in the last 72 hours. Sepsis Labs: No results for input(s): PROCALCITON, LATICACIDVEN in the last 168 hours.  No results found for this or any previous visit (from the past 240 hour(s)).       Radiology Studies: Dg Chest Port 1 View  Result Date: 12/21/2018 CLINICAL DATA:  Fever EXAM: PORTABLE CHEST 1 VIEW COMPARISON:  12/15/2018; 12/03/2018 FINDINGS: Grossly unchanged cardiac silhouette and mediastinal contours post median sternotomy and CABG. Stable position of support apparatus. Lung volumes remain reduced. Pulmonary vasculature remains indistinct with cephalization of flow. Bibasilar heterogeneous opacities, left greater than right, similar to the 3/27 examination. No new focal airspace opacities. No pleural effusion or pneumothorax. No acute osseous abnormalities. IMPRESSION: Similar findings of lung hypoventilation and suspected pulmonary edema with associated bibasilar opacities, atelectasis versus infiltrate. Further evaluation with a PA and lateral chest radiograph may be obtained as clinically indicated. Electronically Signed   By: Sandi Mariscal M.D.   On: 12/21/2018 08:13   Ir Imaging Guided Port Insertion  Result Date: 12/19/2018 CLINICAL DATA:  Metastatic hepatocellular carcinoma and need for porta cath for further therapy. EXAM: IMPLANTED PORT A CATH PLACEMENT WITH ULTRASOUND AND FLUOROSCOPIC GUIDANCE ANESTHESIA/SEDATION: 2.0 mg IV Versed; 100 mcg IV Fentanyl Total Moderate Sedation Time:  41 minutes The patient's level of consciousness and physiologic status were continuously monitored during the procedure by Radiology nursing. Additional Medications: 2 g IV Ancef. FLUOROSCOPY TIME:  42 seconds.  42.0 mGy. PROCEDURE: The procedure, risks, benefits, and alternatives were explained to the  patient. Questions regarding the procedure were encouraged and answered. The patient understands and consents to the procedure. A time-out was performed prior to initiating the procedure. Ultrasound was utilized to confirm patency of the right internal jugular vein. The right neck and chest were prepped with chlorhexidine in a sterile fashion, and a sterile drape was applied covering the operative field. Maximum barrier sterile technique with sterile gowns and gloves were used for the procedure. Local anesthesia was provided with 1% lidocaine. After creating a small venotomy incision, a 21 gauge needle was advanced into the right internal jugular vein under direct, real-time ultrasound guidance. Ultrasound image documentation was performed. After securing guidewire access, an 8 Fr dilator was placed. A J-wire was kinked to measure appropriate catheter length. A subcutaneous port pocket was then created along the upper chest wall utilizing sharp and blunt dissection. Portable cautery was utilized. The pocket was irrigated with sterile saline. A single lumen power injectable port was chosen for placement. The 8 Fr catheter was tunneled from the port pocket site to the venotomy incision. The port was placed in the pocket. External catheter was trimmed to appropriate length based  on guidewire measurement. At the venotomy, an 8 Fr peel-away sheath was placed over a guidewire. The catheter was then placed through the sheath and the sheath removed. Final catheter positioning was confirmed and documented with a fluoroscopic spot image. The port was accessed with a needle and aspirated and flushed with heparinized saline. The access needle was removed. The venotomy and port pocket incisions were closed with subcutaneous 3-0 Monocryl and subcuticular 4-0 Vicryl. Dermabond was applied to both incisions. COMPLICATIONS: COMPLICATIONS None FINDINGS: After catheter placement, the tip lies at the cavo-atrial junction. The catheter  aspirates normally and is ready for immediate use. IMPRESSION: Placement of single lumen port a cath via right internal jugular vein. The catheter tip lies at the cavo-atrial junction. A power injectable port a cath was placed and is ready for immediate use. Electronically Signed   By: Aletta Edouard M.D.   On: 12/19/2018 11:25        Scheduled Meds:  calcitonin (salmon)  1 spray Alternating Nares Daily   carvedilol  6.25 mg Oral BID WC   cholecalciferol  2,000 Units Oral Q supper   dexamethasone  4 mg Oral Daily   ezetimibe  10 mg Oral Daily   feeding supplement (GLUCERNA SHAKE)  237 mL Oral TID BM   feeding supplement (PRO-STAT SUGAR FREE 64)  30 mL Oral TID BM   fenofibrate  160 mg Oral Daily   fentaNYL  1 patch Transdermal Q72H   furosemide  40 mg Oral Daily   gabapentin  400 mg Oral TID   insulin aspart  0-9 Units Subcutaneous TID WC   insulin aspart  10 Units Subcutaneous TID WC   insulin glargine  20 Units Subcutaneous BID   irbesartan  300 mg Oral Daily   lidocaine  1 patch Transdermal Q24H   multivitamin with minerals  1 tablet Oral Daily   nystatin  5 mL Oral 6 X Daily   omega-3 acid ethyl esters  2 g Oral BID   polyethylene glycol  17 g Oral BID   pravastatin  80 mg Oral QHS   rivaroxaban  20 mg Oral Q supper   saccharomyces boulardii  250 mg Oral Daily   senna-docusate  2 tablet Oral BID   sodium bicarbonate/sodium chloride   Mouth Rinse QID   sodium chloride flush  3 mL Intravenous Q12H   spironolactone  25 mg Oral Daily   Continuous Infusions:   LOS: 18 days    Time spent: [place], MD [facility] If 7PM-7AM, please contact night-coverage [email or web address] Password [facility] 12/21/2018, 8:23 AM

## 2018-12-21 NOTE — Progress Notes (Signed)
[place] Progress Note Patient Name: Robert Gregory DOB: [birth year] MRN: [id number]   Date of Service  12/21/2018  HPI/Events of Note  Pt not able to void. Pt was straight cathed, putting out 1250cc of urine.   eICU Interventions  Insert foley cath.      Intervention Category Intermediate Interventions: Oliguria - evaluation and management  [name] 12/21/2018, 10:38 PM

## 2018-12-21 NOTE — Progress Notes (Addendum)
[facility] yellow vital signs guidelines: TMAX 102.5 oral BP 145/59 MAP 86 RESP 20 SPO2 99 on CPAP Attending paged and charge nurse notified.

## 2018-12-21 NOTE — Progress Notes (Signed)
OT Cancellation Note  Patient Details Name: Robert Gregory MRN: [id number] DOB: 03/22/1941   Cancelled Treatment:    Reason Eval/Treat Not Completed: Patient's level of consciousness.  NT reports pt still sleepy from a medication.  XRT planned for 1130. Will likely check back tomorrow.  Jamiaya Bina 12/21/2018, 10:54 AM  Lesle Chris, OTR/L Acute Rehabilitation Services [phone] [facility] pager [phone] office 12/21/2018

## 2018-12-21 NOTE — Progress Notes (Signed)
PT Cancellation Note  Patient Details Name: Robert Gregory MRN: [id number] DOB: 05-24-1941   Cancelled Treatment:    Reason Eval/Treat Not Completed: Medical issues which prohibited therapy Transferred to ICU earlier.   Annasofia Pohl,KATHrine E 12/21/2018, 3:58 PM Carmelia Bake, PT, DPT Acute Rehabilitation Services Office: [phone] Pager: [phone]

## 2018-12-21 NOTE — Significant Event (Signed)
Notified by nursing staff at around 10 AM the patient was noted to be hypotensive.  Patient was also noted to be febrile.  Patient did have fever earlier in the morning blood cultures are already been ordered but because patient was now more lethargic and then hypotensive patient was started on IV cefepime and vancomycin.  Patient given 1 L bolus.  Despite 1 L bolus being completed repeat blood pressure was 60 systolic.  Patient continued to be quite sleepy and altered.  He was much more lethargic than before.  Patient was transferred to ICU.  Another bolus of IV fluids was ordered.  Norepinephrine was started by the patient's port site.  An ABG was done which showed mild hyperventilation.  Patient was placed on 2 L.  Wife was notified.  She again reiterated the patient wanted to be DO NOT RESUSCITATE and would accept noninvasive ventilations and pressors but did not want intubation or CPR.

## 2018-12-22 ENCOUNTER — Inpatient Hospital Stay (HOSPITAL_COMMUNITY): Payer: Medicare Other

## 2018-12-22 ENCOUNTER — Ambulatory Visit: Payer: Medicare Other

## 2018-12-22 DIAGNOSIS — I340 Nonrheumatic mitral (valve) insufficiency: Secondary | ICD-10-CM

## 2018-12-22 DIAGNOSIS — C7951 Secondary malignant neoplasm of bone: Principal | ICD-10-CM

## 2018-12-22 DIAGNOSIS — Z91041 Radiographic dye allergy status: Secondary | ICD-10-CM

## 2018-12-22 DIAGNOSIS — Z95828 Presence of other vascular implants and grafts: Secondary | ICD-10-CM

## 2018-12-22 DIAGNOSIS — Z950 Presence of cardiac pacemaker: Secondary | ICD-10-CM

## 2018-12-22 DIAGNOSIS — Z951 Presence of aortocoronary bypass graft: Secondary | ICD-10-CM

## 2018-12-22 DIAGNOSIS — Z91048 Other nonmedicinal substance allergy status: Secondary | ICD-10-CM

## 2018-12-22 DIAGNOSIS — R7881 Bacteremia: Secondary | ICD-10-CM

## 2018-12-22 DIAGNOSIS — B952 Enterococcus as the cause of diseases classified elsewhere: Secondary | ICD-10-CM

## 2018-12-22 DIAGNOSIS — Z888 Allergy status to other drugs, medicaments and biological substances status: Secondary | ICD-10-CM

## 2018-12-22 DIAGNOSIS — I2510 Atherosclerotic heart disease of native coronary artery without angina pectoris: Secondary | ICD-10-CM

## 2018-12-22 LAB — COMPREHENSIVE METABOLIC PANEL
ALT: 160 U/L — ABNORMAL HIGH (ref 0–44)
AST: 115 U/L — ABNORMAL HIGH (ref 15–41)
Albumin: 3.1 g/dL — ABNORMAL LOW (ref 3.5–5.0)
Alkaline Phosphatase: 238 U/L — ABNORMAL HIGH (ref 38–126)
Anion gap: 10 (ref 5–15)
BUN: 44 mg/dL — ABNORMAL HIGH (ref 8–23)
CO2: 24 mmol/L (ref 22–32)
Calcium: 8 mg/dL — ABNORMAL LOW (ref 8.9–10.3)
Chloride: 102 mmol/L (ref 98–111)
Creatinine, Ser: 1.01 mg/dL (ref 0.61–1.24)
GFR calc Af Amer: >60 mL/min (ref >60)
GFR calc non Af Amer: >60 mL/min (ref >60)
Glucose, Bld: 210 mg/dL — ABNORMAL HIGH (ref 70–99)
Potassium: 4.8 mmol/L (ref 3.5–5.1)
Sodium: 136 mmol/L (ref 135–145)
Total Bilirubin: 2.4 mg/dL — ABNORMAL HIGH (ref 0.3–1.2)
Total Protein: 5.7 g/dL — ABNORMAL LOW (ref 6.5–8.1)

## 2018-12-22 LAB — CBC
HCT: 35.7 % — ABNORMAL LOW (ref 39.0–52.0)
Hemoglobin: 11.3 g/dL — ABNORMAL LOW (ref 13.0–17.0)
MCH: 31.5 pg (ref 26.0–34.0)
MCHC: 31.7 g/dL (ref 30.0–36.0)
MCV: 99.4 fL (ref 80.0–100.0)
Platelets: 55 10*3/uL — ABNORMAL LOW (ref 150–400)
RBC: 3.59 MIL/uL — ABNORMAL LOW (ref 4.22–5.81)
RDW: 19.9 % — ABNORMAL HIGH (ref 11.5–15.5)
WBC: 7.8 10*3/uL (ref 4.0–10.5)
nRBC: 0 % (ref 0.0–0.2)

## 2018-12-22 LAB — URINE CULTURE: Culture: NO GROWTH

## 2018-12-22 LAB — GLUCOSE, CAPILLARY
Glucose-Capillary: 205 mg/dL — ABNORMAL HIGH (ref 70–99)
Glucose-Capillary: 322 mg/dL — ABNORMAL HIGH (ref 70–99)

## 2018-12-22 LAB — PROCALCITONIN: Procalcitonin: 0.26 ng/mL

## 2018-12-22 LAB — ECHOCARDIOGRAM LIMITED
Height: 73 in
Weight: 4850.12 oz

## 2018-12-22 LAB — AFP TUMOR MARKER: AFP, Serum, Tumor Marker: 6002 ng/mL — ABNORMAL HIGH (ref 0.0–8.3)

## 2018-12-22 MED ORDER — PERFLUTREN LIPID MICROSPHERE
1.0000 mL | INTRAVENOUS | Status: AC | PRN
  Administered 2018-12-22: 2 mL via INTRAVENOUS
  Filled 2018-12-22: qty 10

## 2018-12-22 MED ORDER — SODIUM CHLORIDE 0.9 % IV SOLN
2.0000 g | INTRAVENOUS | Status: DC
  Administered 2018-12-22 – 2018-12-28 (×38): 2 g via INTRAVENOUS
  Filled 2018-12-22: qty 2
  Filled 2018-12-22 (×2): qty 2000
  Filled 2018-12-22 (×2): qty 2
  Filled 2018-12-22 (×5): qty 2000
  Filled 2018-12-22 (×2): qty 2
  Filled 2018-12-22: qty 2000
  Filled 2018-12-22: qty 2
  Filled 2018-12-22: qty 2000
  Filled 2018-12-22 (×5): qty 2
  Filled 2018-12-22 (×2): qty 2000
  Filled 2018-12-22: qty 2
  Filled 2018-12-22: qty 2000
  Filled 2018-12-22: qty 2
  Filled 2018-12-22: qty 2000
  Filled 2018-12-22 (×4): qty 2
  Filled 2018-12-22: qty 2000
  Filled 2018-12-22 (×2): qty 2
  Filled 2018-12-22: qty 2000
  Filled 2018-12-22: qty 2
  Filled 2018-12-22: qty 2000
  Filled 2018-12-22: qty 2
  Filled 2018-12-22 (×2): qty 2000
  Filled 2018-12-22 (×3): qty 2
  Filled 2018-12-22: qty 2000
  Filled 2018-12-22 (×2): qty 2
  Filled 2018-12-22: qty 2000
  Filled 2018-12-22 (×2): qty 2
  Filled 2018-12-22: qty 2000
  Filled 2018-12-22: qty 2

## 2018-12-22 NOTE — Progress Notes (Addendum)
PROGRESS NOTE    Robert Gregory  UXL:[id number] DOB: 07/23/1941 DOA: 12/03/2018 PCP: Seward Carol, MD   Brief Narrative:  78 year old with past medical history relevant for coronary artery disease status post CABG, status post pacemaker placement, atrial fibrillation on rivaroxaban, stage III CKD, obstructive sleep apnea, type 2 diabetes, Robert Gregory cirrhosis, recent diagnosis of metastatic hepatocellular carcinoma with disease to thoracic spine, hypertension admitted with near syncope and found to be in complete heart block status post pacemaker adjustment.  His hospital course has been complicated by bilateral lower extremity cellulitis, AKI on CKD complicated by hyperkalemia, dysphasia found to have esophageal dysmotility, recalcitrant back pain due to T7 pathologic fracture transferred to [facility] on 12/10/2018 for initiation of radiation therapy.  His course has been complicated by sepsis syndrome secondary to enterococcus bacteremia.   Assessment & Plan:   Principal Problem:   Near syncope Active Problems:   Essential hypertension   Hyperlipidemia   Type 2 diabetes mellitus with stage 3 chronic kidney disease (HCC)   Chronic back pain   Obesity, Class III, BMI 40-49.9 (morbid obesity) (HCC)   Cellulitis of right lower extremity   Complete heart block (HCC)   Acute renal failure superimposed on stage 3 chronic kidney disease (HCC)   Hepatocellular carcinoma (HCC)  #) Severe sepsis due to enterococcus bacteremia: Currently the etiology of his enterococcus bacteremia is unclear.  Certainly could be from hepatobiliary sources he does have hepatocellular carcinoma.  Unfortunately yesterday patient became hypotensive and altered and required transfer to the ICU and initiation of [place] of norepinephrine.  His blood cultures rapidly came back with enterococcus that was not VRE.  He was started on empiric IV ampicillin.  He has multiple possible sources.  He was also noted to have urinary  retention.  His urinalysis did not look profoundly infected.  His abdomen is fairly benign.  Unfortunately he does have a port in place.. -Continue IV ampicillin started for 12/08/2018 - Blood cultures 12/21/2018 growing out enterococcus, pending sensitivities -Blood cultures repeat ordered for 12/08/2018 -We will discuss with infectious disease about ID about need for echo and port site but if bacteremia can be cleared rapidly then would avoid removal of port site - Dexamethasone was discontinued and patient was started on IV stress dose steroids, will wean after patient comes off pressors -We will consider CT abdomen pelvis  #) Elevated LFTs: Patient has had elevated LFTs with a been slowly uptrending. -Right upper quadrant ultrasound shows no evidence of obstruction but does show known cirrhosis as well as hepatocellular carcinoma, gallbladder wall thickening noted but no evidence of sonographic Robert Gregory sign  #) Metastatic hepatocellular carcinoma/recalcitrant T7 pain due to pathologic fracture: Patient has been intermittently refusing certain pain medications as he is quite sensitive to them. -Continue tramadol 25 to 50 mg every 6 hours as needed -Continue fentanyl patch 25 mcg every 72 hours -Continue lidocaine patch -Continue gabapentin 400 mg 3 times daily -Oncology following, Dr. [name] -Oncology started steroids as well as given 1 dose of zoledronic acid and calcitonin -Radiation oncology following, treatment started on 12/11/2018 -Neurosurgery did not feel that there was any surgical options for the patient due to high risk of decompensation -Underwent Port-A-Cath placement on 12/19/2018.  #) Acute on chronic diastolic heart failure: Resolved after IV furosemide -Hold home oral furosemide  #) Acute encephalopathy due to medication: Resolved.  Secondary to sepsis  #) Paroxysmal atrial fibrillation: -Continue rivaroxaban, oncology will discuss with general surgery about  transitioning patient to  enoxaparin subcu  -Continue beta-blocker  #) Type 2 diabetes complicated by neuropathy: Blood sugars have increased on steroids -Continue glargine 20 units twice daily -Continue aspart 10 units with meals 3 times daily -Patient is on U500 at home -Sliding scale insulin, AC at bedtime -Carb restricted diet  #) Bilateral lower extremity cellulitis: This appears to have largely resolved with a course of IV and p.o. antibiotics  #) Dysphagia: -Swallow study showed evidence of nonspecific esophageal dysmotility -Gastroenterology has signed off  #) Complete heart block status post pacemaker/near syncope: Patient initially presented with near syncope and was found to be in complete heart block.  Patient did have pacemaker changed from VVI to DDD -Continue telemetry -Cardiology signed off, appreciate recommendations  #) AKI on CKD stage III/hyperkalemia: This is resolved with IV fluids. -Avoid nephrotoxins  #) Coronary artery disease status post CABG/hypertension/hyperlipidemia: -Hold carvedilol 6.25 mg twice daily -Hold ARB  #) OSA: -Continue CPAP nightly  Fluids: Tolerating p.o. Electrolytes: Monitor and supplement Nutrition: Carb restricted diet  Disposition: Pending resolution of sepsis and discharge to home with home health PT  DO NOT RESUSCITATE   Consultants:   Neurosurgery  Cardiology  Oncology, Dr. [name]  Radiation oncology  Gastroenterology, Robert Gregory  Physical medicine rehabilitation  Procedures:   Pacemaker transition from VVI to DDD pacing on 12/03/2018  Esophagram 12/05/2018 nonspecific esophageal motility disorder  Antimicrobials:   P.o. cephalexin started 12/08/2018 to 12/12/2018  IV cefazolin 12/03/2020 to 12/08/2018  IV vancomycin and Zosyn 12/03/2018 to 12/04/2018  IV ampicillin started for 12/08/2018 ongoing   Subjective: Patient is feeling much better this morning.  He reports that he is confused as to how he got in the  ICU but he is now much more alert and oriented.  He denies any nausea, vomiting, diarrhea, cough, congestion, rhinorrhea.  He was informed of his blood culture growing out enterococcus bacteremia.  Objective: Vitals:   12/22/18 0530 12/22/18 0600 12/22/18 0630 12/22/18 0700  BP: (!) 111/50 (!) 102/42 (!) 105/44 (!) 105/45  Pulse: 75 70 70 74  Resp: 12 16 15 16   Temp:      TempSrc:      SpO2: 93% (!) 89% 92% 92%  Weight:      Height:        Intake/Output Summary (Last 24 hours) at 12/22/2018 0803 Last data filed at 12/22/2018 0600 Gross per 24 hour  Intake 2411.43 ml  Output 2550 ml  Net -138.57 ml   Filed Weights   12/08/18 0509 12/09/18 0600 12/21/18 1300  Weight: (!) 143.2 kg (!) 146.3 kg (!) 137.5 kg    Examination:  General exam: Appears calm and comfortable  Respiratory system: No increased work of breathing, anterior lung sounds clear, no wheezes, crackles, rhonchi, diminished lung sounds at bases Cardiovascular system: Regular rate and rhythm, no murmurs Gastrointestinal system: Soft, nondistended, no rebound or guarding, plus bowel sounds. Central nervous system: Alert and oriented.  Bilateral lower extremity strength is 5 out of 5, upper cavity strength is 5 out of 5 Extremities: 1+ lower extremity edema Skin: No rashes over visible skin Psychiatry: Judgement and insight appear normal. Mood & affect appropriate.  Mildly slowed    Data Reviewed: I have personally reviewed following labs and imaging studies  CBC: Recent Labs  Lab 12/16/18 0347 12/18/18 0523 12/19/18 0411 12/21/18 0418 12/22/18 0500  WBC 7.0 6.9 6.7 7.5 7.8  NEUTROABS  --  5.5 5.6  --   --   HGB 11.3* 11.5* 11.5* 12.8* 11.3*  HCT 37.5* 36.5* 37.0* 40.7 35.7*  MCV 101.4* 99.2 97.9 98.3 99.4  PLT 70* 79* 70* 57* 55*   Basic Metabolic Panel: Recent Labs  Lab 12/18/18 0523 12/19/18 0411 12/20/18 0440 12/21/18 0418 12/22/18 0500  NA 137   138 137 136 134* 136  K 4.2   4.2 4.3 4.4 5.0 4.8   CL 95*   95* 96* 96* 95* 102  CO2 31   32 31 31 28 24   GLUCOSE 188*   194* 227* 225* 183* 210*  BUN 41*   42* 41* 45* 40* 44*  CREATININE 0.95   0.98 0.93 0.96 0.92 1.01  CALCIUM 9.0   9.2 9.1 9.1 9.3 8.0*  MG 2.3  --   --   --   --    GFR: Estimated Creatinine Clearance: 89.1 mL/min (by C-G formula based on SCr of 1.01 mg/dL). Liver Function Tests: Recent Labs  Lab 12/18/18 0523 12/21/18 0418 12/22/18 0500  AST 131* 163* 115*  ALT 171* 211* 160*  ALKPHOS 277* 337* 238*  BILITOT 1.5* 2.2* 2.4*  PROT 6.3* 7.1 5.7*  ALBUMIN 3.5 3.7 3.1*   No results for input(s): LIPASE, AMYLASE in the last 168 hours. No results for input(s): AMMONIA in the last 168 hours. Coagulation Profile: Recent Labs  Lab 12/19/18 0411  INR 1.2   Cardiac Enzymes: No results for input(s): CKTOTAL, CKMB, CKMBINDEX, TROPONINI in the last 168 hours. BNP (last 3 results) No results for input(s): PROBNP in the last 8760 hours. HbA1C: No results for input(s): HGBA1C in the last 72 hours. CBG: Recent Labs  Lab 12/20/18 2152 12/21/18 0746 12/21/18 1115 12/21/18 1654 12/21/18 2116  GLUCAP 285* 123* 78 122* 165*   Lipid Profile: No results for input(s): CHOL, HDL, LDLCALC, TRIG, CHOLHDL, LDLDIRECT in the last 72 hours. Thyroid Function Tests: No results for input(s): TSH, T4TOTAL, FREET4, T3FREE, THYROIDAB in the last 72 hours. Anemia Panel: No results for input(s): VITAMINB12, FOLATE, FERRITIN, TIBC, IRON, RETICCTPCT in the last 72 hours. Sepsis Labs: Recent Labs  Lab 12/21/18 0750 12/22/18 0500  PROCALCITON 0.20 0.26    Recent Results (from the past 240 hour(s))  Culture, blood (routine x 2)     Status: None (Preliminary result)   Collection Time: 12/21/18  7:50 AM  Result Value Ref Range Status   Specimen Description   Final    BLOOD LEFT ARM Performed at [facility] [address] [place], [place] [address]    Special Requests   Final    BOTTLES DRAWN AEROBIC  AND ANAEROBIC Blood Culture adequate volume Performed at [place] [address], [place], [place] [address]    Culture  Setup Time   Final    GRAM POSITIVE COCCI IN BOTH AEROBIC AND ANAEROBIC BOTTLES CRITICAL VALUE NOTED.  VALUE IS CONSISTENT WITH PREVIOUSLY REPORTED AND CALLED VALUE. Performed at [facility], [place] [address], [place], [place] [address]    Culture GRAM POSITIVE COCCI  Final   Report Status PENDING  Incomplete  Culture, blood (routine x 2)     Status: None (Preliminary result)   Collection Time: 12/21/18  7:51 AM  Result Value Ref Range Status   Specimen Description   Final    BLOOD RIGHT ARM Performed at [place] [address] [place], [place] [address]    Special Requests   Final    BOTTLES DRAWN AEROBIC AND ANAEROBIC Blood Culture adequate volume Performed at [facility], [place] [address] [place], [place] [address]    Culture  Setup Time   Final    IN BOTH AEROBIC AND ANAEROBIC BOTTLES GRAM POSITIVE COCCI Organism ID to follow CRITICAL RESULT CALLED TO, READ BACK BY AND VERIFIED WITH: B GREEN PHARMD 12/21/18 2337 JDW Performed at [facility], [place] [address], [place], [place] [address]    Culture GRAM POSITIVE COCCI  Final   Report Status PENDING  Incomplete  Blood Culture ID Panel (Reflexed)     Status: Abnormal   Collection Time: 12/21/18  7:51 AM  Result Value Ref Range Status   Enterococcus species DETECTED (A) NOT DETECTED Final    Comment: CRITICAL RESULT CALLED TO, READ BACK BY AND VERIFIED WITH: B GREEN PHARMD 12/21/18 2337 JDW    Vancomycin resistance NOT DETECTED NOT DETECTED Final   Listeria monocytogenes NOT DETECTED NOT DETECTED Final   Staphylococcus species NOT DETECTED NOT DETECTED Final   Staphylococcus aureus (BCID) NOT DETECTED NOT DETECTED Final   Streptococcus species NOT DETECTED NOT DETECTED Final   Streptococcus agalactiae NOT DETECTED NOT DETECTED Final     Streptococcus pneumoniae NOT DETECTED NOT DETECTED Final   Streptococcus pyogenes NOT DETECTED NOT DETECTED Final   Acinetobacter baumannii NOT DETECTED NOT DETECTED Final   Enterobacteriaceae species NOT DETECTED NOT DETECTED Final   Enterobacter cloacae complex NOT DETECTED NOT DETECTED Final   Escherichia coli NOT DETECTED NOT DETECTED Final   Klebsiella oxytoca NOT DETECTED NOT DETECTED Final   Klebsiella pneumoniae NOT DETECTED NOT DETECTED Final   Proteus species NOT DETECTED NOT DETECTED Final   Serratia marcescens NOT DETECTED NOT DETECTED Final   Haemophilus influenzae NOT DETECTED NOT DETECTED Final   Neisseria meningitidis NOT DETECTED NOT DETECTED Final   Pseudomonas aeruginosa NOT DETECTED NOT DETECTED Final   Candida albicans NOT DETECTED NOT DETECTED Final   Candida glabrata NOT DETECTED NOT DETECTED Final   Candida krusei NOT DETECTED NOT DETECTED Final   Candida parapsilosis NOT DETECTED NOT DETECTED Final   Candida tropicalis NOT DETECTED NOT DETECTED Final    Comment: Performed at [facility], [address] [place], [place] [address]  Culture, Urine     Status: None   Collection Time: 12/21/18  9:59 AM  Result Value Ref Range Status   Specimen Description   Final    URINE, CLEAN CATCH Performed at [facility], [place] [address], [place], [place] [address]    Special Requests   Final    NONE Performed at [facility], [place] [address] [place], [place] [address]    Culture   Final    NO GROWTH Performed at [facility], [place] [address] [place], [place] [address]    Report Status 12/22/2018 FINAL  Final  MRSA PCR Screening     Status: None   Collection Time: 12/21/18  2:48 PM  Result Value Ref Range Status   MRSA by PCR NEGATIVE NEGATIVE Final    Comment:        The GeneXpert MRSA Assay (FDA approved for NASAL specimens only), is one component of a comprehensive MRSA colonization surveillance program. It  is not intended to diagnose MRSA infection nor to guide or monitor treatment for MRSA infections. Performed at [facility], [place] [address], [place], [place] [address]          Radiology Studies: Dg Chest Port 1 View  Result Date: 12/21/2018 CLINICAL DATA:  Fever EXAM:  PORTABLE CHEST 1 VIEW COMPARISON:  12/15/2018; 12/03/2018 FINDINGS: Grossly unchanged cardiac silhouette and mediastinal contours post median sternotomy and CABG. Stable position of support apparatus. Lung volumes remain reduced. Pulmonary vasculature remains indistinct with cephalization of flow. Bibasilar heterogeneous opacities, left greater than right, similar to the 3/27 examination. No new focal airspace opacities. No pleural effusion or pneumothorax. No acute osseous abnormalities. IMPRESSION: Similar findings of lung hypoventilation and suspected pulmonary edema with associated bibasilar opacities, atelectasis versus infiltrate. Further evaluation with a PA and lateral chest radiograph may be obtained as clinically indicated. Electronically Signed   By: Sandi Mariscal M.D.   On: 12/21/2018 08:13   US Abdomen Limited Ruq  Result Date: 12/21/2018 CLINICAL DATA:  Elevated liver function test. EXAM: ULTRASOUND ABDOMEN LIMITED RIGHT UPPER QUADRANT COMPARISON:  CT, 12/01/2018 FINDINGS: Gallbladder: Dependent gallstones. Wall is thickened and edematous measuring 9 mm. No sonographic Murphy's sign. Common bile duct: Diameter: 5-6 mm.  Not seen distally. Liver: Heterogeneous coarsened echotexture. Nodular contour. 2 discrete echogenic nodules noted, 3.6 x 3.1 x 3.2 cm in the right lobe 3.9 x 1.3 x 3.2 cm anterior, left lobe. These are not well-defined. Portal vein is patent but demonstrates to and fro blood flow. Small amount of ascites. IMPRESSION: 1. Cholelithiasis and gallbladder wall thickening/edema. Gallbladder wall thickening is nonspecific in the setting of ascites. No convincing acute cholecystitis. No  sonographic Murphy's sign. 2. Cirrhosis with liver masses. These masses are better defined on the recent CT scan, definite for hepatocellular carcinoma as reported on the prior CT. 3. Small amount of ascites. Electronically Signed   By: Lajean Manes M.D.   On: 12/21/2018 16:53        Scheduled Meds:  calcitonin (salmon)  1 spray Alternating Nares Daily   Chlorhexidine Gluconate Cloth  6 each Topical Daily   cholecalciferol  2,000 Units Oral Q supper   ezetimibe  10 mg Oral Daily   feeding supplement (GLUCERNA SHAKE)  237 mL Oral TID BM   feeding supplement (PRO-STAT SUGAR FREE 64)  30 mL Oral TID BM   fenofibrate  160 mg Oral Daily   fentaNYL  1 patch Transdermal Q72H   gabapentin  400 mg Oral TID   hydrocortisone sod succinate (SOLU-CORTEF) inj  50 mg Intravenous Q6H   insulin aspart  0-9 Units Subcutaneous TID WC   insulin aspart  10 Units Subcutaneous TID WC   insulin glargine  20 Units Subcutaneous BID   lidocaine  1 patch Transdermal Q24H   mouth rinse  15 mL Mouth Rinse BID   multivitamin with minerals  1 tablet Oral Daily   nystatin  5 mL Oral 6 X Daily   omega-3 acid ethyl esters  2 g Oral BID   polyethylene glycol  17 g Oral BID   pravastatin  80 mg Oral QHS   rivaroxaban  20 mg Oral Q supper   saccharomyces boulardii  250 mg Oral Daily   senna-docusate  2 tablet Oral BID   sodium bicarbonate/sodium chloride   Mouth Rinse QID   sodium chloride flush  10-40 mL Intracatheter Q12H   sodium chloride flush  3 mL Intravenous Q12H   Continuous Infusions:  ampicillin (OMNIPEN) IV 2 g (12/22/18 0742)   norepinephrine (LEVOPHED) Adult infusion 1 mcg/min (12/22/18 0745)     LOS: 19 days    Time spent: [place], MD [facility] If 7PM-7AM, please contact night-coverage [email or web address] Password [email or web address] 12/22/2018, 8:03 AM

## 2018-12-22 NOTE — Progress Notes (Signed)
Occupational Therapy Treatment Patient Details Name: Robert Gregory MRN: [id number] DOB: [birth year] Today's Date: 12/22/2018    History of present illness Pt is a 78 y/o male admitted 12/03/18 with dizziness, bradycardia and near syncope. Pt with complete heart block and metastatic hepatocellular carinoma with T7 compression fx. Not a candidate for verterbroplasty, to begin palliative radiation at [facility] 3/20. PMH includes DM, MI, ICD, HTN, HLD, A-fib.   OT comments  Pt now in ICU/SDU.  Needs a little more assistance than previous note:  fatiques quickly.  Goals remain appropriate   Follow Up Recommendations  Supervision/Assistance - 24 hour;Home health OT    Equipment Recommendations  (pt has toilet riser)    Recommendations for Other Services      Precautions / Restrictions Precautions Precautions: Fall;Back Precaution Booklet Issued: No Precaution Comments: pt does not wish to use brace Spinal Brace: Thoracolumbosacral orthotic Restrictions Weight Bearing Restrictions: No       Mobility Bed Mobility                  Transfers   Equipment used: Rolling walker (2 wheeled)   Sit to Stand: Min guard         General transfer comment: for safety:  pt shaky when standing    Balance                                           ADL either performed or assessed with clinical judgement   ADL       Grooming: Wash/dry hands;Min guard;Standing                                 General ADL Comments: Pt a little shaky standing at sink:  in ICU/SDU. Ambulated in room twice with rest break min guard +2 safety/lines     Vision       Perception     Praxis      Cognition Arousal/Alertness: Awake/alert Behavior During Therapy: WFL for tasks assessed/performed Overall Cognitive Status: Within Functional Limits for tasks assessed                                          Exercises     Shoulder Instructions        General Comments VSS; rest breaks given    Pertinent Vitals/ Pain       Pain Assessment: Faces Faces Pain Scale: Hurts a little bit Pain Location: back  Pain Descriptors / Indicators: Discomfort;Sore Pain Intervention(s): Limited activity within patient's tolerance;Monitored during session;Premedicated before session;Repositioned  Home Living                                          Prior Functioning/Environment              Frequency  Min 2X/week        Progress Toward Goals  OT Goals(current goals can now be found in the care plan section)  Progress towards OT goals: Not progressing toward goals - comment(slight decrease since transfer to ICU/SDU; goals appropriate)     Plan      Co-evaluation  PT/OT/SLP Co-Evaluation/Treatment: Yes Reason for Co-Treatment: For patient/therapist safety PT goals addressed during session: Mobility/safety with mobility OT goals addressed during session: ADL's and self-care      AM-PAC OT "6 Clicks" Daily Activity     Outcome Measure   Help from another person eating meals?: None Help from another person taking care of personal grooming?: A Little Help from another person toileting, which includes using toliet, bedpan, or urinal?: A Little Help from another person bathing (including washing, rinsing, drying)?: A Little Help from another person to put on and taking off regular upper body clothing?: A Little Help from another person to put on and taking off regular lower body clothing?: A Little 6 Click Score: 19    End of Session    OT Visit Diagnosis: Unsteadiness on feet (R26.81);Other abnormalities of gait and mobility (R26.89);Muscle weakness (generalized) (M62.81);Pain   Activity Tolerance Patient limited by fatigue   Patient Left in bed;with call bell/phone within reach;with bed alarm set   Nurse Communication          Time: [phone] OT Time Calculation (min): 33 min  Charges: OT  General Charges $OT Visit: 1 Visit OT Treatments $Self Care/Home Management : 8-22 mins  Lesle Chris, OTR/L Acute Rehabilitation Services [phone] [place] pager [phone] office 12/22/2018   Tynesia Harral 12/22/2018, 1:40 PM

## 2018-12-22 NOTE — Progress Notes (Signed)
Report called to Anna.  Patient taken to room via wheelchair.

## 2018-12-22 NOTE — Progress Notes (Signed)
I am absolutely shocked as to what is going on.  He will look so good this week.  Now, he is growing enterococcus in his blood.  I am not sure what the source of this might be.  This definitely is a huge problem.  He is on antibiotics.  Is on ampicillin and vancomycin.  I know that he just had a Port-A-Cath put in.  If he has temperature spikes despite the antibiotics, then the Port-A-Cath might have to come out.  He is now in the ICU now.  His vital signs look a little bit better.  I spoke to his wife this morning on the phone.  I gave her an update as to what it was going on.  I reassured her that he was in the best of hands down in the ICU.  His labs today show some elevation of his LFTs.  Again this might be from the bacteremia and having a little bit of hypotension.  His CBC shows white cell count of 7.8.  Hemoglobin 11.3.  Platelet count 55,000.  I suspect that his platelet count might drop a little bit further given that he has his bacteremia.  Obviously, he is not going home anytime soon.  I guess a lot will depend on his temperature curve and his blood pressure.  Maybe, he can be able to be placed on oral antibiotics at some point.  I am not sure if he will have his radiation therapy today for his back.  This would be his last treatment.  Again, I know that he is going to get fantastic care from the staff in the ICU.  The staff up on [address] did a wonderful job with him.  They really were attentive to his concerns and were always on top of any problem that arose.  Lattie Haw, MD  Oswaldo Milian 12:2

## 2018-12-22 NOTE — TOC Progression Note (Signed)
Transition of Care [facility]) - Progression Note    Patient Details  Name: MAEJOR ERVEN MRN: [id number] Date of Birth: [birth year]  Transition of Care [facility]) CM/SW Contact  Vasilis Luhman, Juliann Pulse, RN Phone Number: 12/22/2018, 3:21 PM  Clinical Narrative:Transferred to 4th floor today from SDU. Alvis Lemmings rep Tommi Rumps still following-HHRN/PT/OT/aide/social worker.       Expected Discharge Plan: [place] Barriers to Discharge: No Barriers Identified  Expected Discharge Plan and Services Expected Discharge Plan: [place]   Discharge Planning Services: CM Consult   Living arrangements for the past 2 months: Single Family Home                           Social Determinants of Health (SDOH) Interventions    Readmission Risk Interventions Readmission Risk Prevention Plan 12/12/2018  Transportation Screening Complete  Medication Review Press photographer) Complete  PCP or Specialist appointment within 3-5 days of discharge Complete  HRI or Home Care Consult Complete  SW Recovery Care/Counseling Consult Complete  Palliative Care Screening Complete  Skilled Nursing Facility Complete  Some recent data might be hidden

## 2018-12-22 NOTE — Consult Note (Signed)
[facility] for Infectious Disease       Reason for Consult: Enterococcal bacteremia    Referring Physician: CHAMP autoconsult  Principal Problem:   Near syncope Active Problems:   Essential hypertension   Hyperlipidemia   Type 2 diabetes mellitus with stage 3 chronic kidney disease (HCC)   Chronic back pain   Obesity, Class III, BMI 40-49.9 (morbid obesity) (HCC)   Cellulitis of right lower extremity   Complete heart block (HCC)   Acute renal failure superimposed on stage 3 chronic kidney disease ([place])   Hepatocellular carcinoma ([place])   . calcitonin (salmon)  1 spray Alternating Nares Daily  . Chlorhexidine Gluconate Cloth  6 each Topical Daily  . cholecalciferol  2,000 Units Oral Q supper  . ezetimibe  10 mg Oral Daily  . feeding supplement (GLUCERNA SHAKE)  237 mL Oral TID BM  . feeding supplement (PRO-STAT SUGAR FREE 64)  30 mL Oral TID BM  . fenofibrate  160 mg Oral Daily  . fentaNYL  1 patch Transdermal Q72H  . gabapentin  400 mg Oral TID  . hydrocortisone sod succinate (SOLU-CORTEF) inj  50 mg Intravenous Q6H  . insulin aspart  0-9 Units Subcutaneous TID WC  . insulin aspart  10 Units Subcutaneous TID WC  . insulin glargine  20 Units Subcutaneous BID  . lidocaine  1 patch Transdermal Q24H  . mouth rinse  15 mL Mouth Rinse BID  . multivitamin with minerals  1 tablet Oral Daily  . nystatin  5 mL Oral 6 X Daily  . omega-3 acid ethyl esters  2 g Oral BID  . polyethylene glycol  17 g Oral BID  . pravastatin  80 mg Oral QHS  . rivaroxaban  20 mg Oral Q supper  . saccharomyces boulardii  250 mg Oral Daily  . senna-docusate  2 tablet Oral BID  . sodium bicarbonate/sodium chloride   Mouth Rinse QID  . sodium chloride flush  10-40 mL Intracatheter Q12H  . sodium chloride flush  3 mL Intravenous Q12H    Recommendations: Repeat blood cultures (done) I have ordered a TTE but will need a TEE with the pacemaker I do think the port needs to be removed   Assessment: He has underlying HCC and developed a fever and hypotension after placement of a port on 3/31 and now with Enterococcal bacteremia in 2 sets of blood cultures.    Antibiotics: ampicillin day 2 Initially on vancomycin and cefepime  HPI: Robert Gregory is a 78 y.o. male with history of CAD s/p CABG, AICD then pacemaker placement and came in with syncope on 3/15.  He has [place] with metastatic disease to spine and started radiation on 3/23.  He underwent port placement 3/31 for planned immunotherapy and anti-angiogenesis therapy and was doing well until 4/2 in the am developed fever and hypotension.  Transferred to ICU and blood cultures as above. Feels much better.     Review of Systems:  Constitutional: negative for fevers and chills Respiratory: negative for cough or sputum Gastrointestinal: negative for nausea and diarrhea Musculoskeletal: negative for myalgias All other systems reviewed and are negative    Past Medical History:  Diagnosis Date  . Arthritis   . Atrial fibrillation ([place])    takes Xarelto daily  . Cellulitis    RLE  . Cholelithiasis   . Chronic back pain    scoliosis/arthritis  . Complication of anesthesia    forgetful for about 2-3wks after anesthesia  . Hepatocellular  carcinoma ([place])   . History of colon polyps   . Hyperlipidemia    takes [employer] daily  . Hypertension    takes Benicar and Coreg daily  . ICD (implantable cardioverter-defibrillator) in place 11/07/2013  . Iron deficiency anemia due to chronic blood loss 08/24/2018  . Iron malabsorption 08/24/2018  . Ischemic cardiomyopathy   . Myocardial infarction ([place]) 1997   on the OR table   . Nephrolithiasis   . Obstructive sleep apnea    CPAP  . Peripheral neuropathy   . Pernicious anemia 08/24/2018  . Spinal stenosis   . Thrombocytopenia ([place])   . Total knee replacement status 11/29/2014  . Type 2 diabetes mellitus with stage 3 chronic kidney disease ([place]) 05/19/2015     Social History   Tobacco Use  . Smoking status: Never Smoker  . Smokeless tobacco: Never Used  Substance Use Topics  . Alcohol use: Yes    Alcohol/week: 0.0 standard drinks    Comment: once drink a month  . Drug use: No    Family History  Problem Relation Age of Onset  . Pneumonia Mother   . Heart disease Other        No family history    Allergies  Allergen Reactions  . Feraheme [Ferumoxytol] Other (See Comments)    Patient had  reaction during infusion in Feb 2020 decision made to change to [facility] (passed out)  . Contrast Media [Iodinated Diagnostic Agents] Rash and Other (See Comments)    Extreme flushing  . Iodine Other (See Comments)    flushing  . Ioxaglate Other (See Comments)    Extreme flushing  . Adhesive [Tape] Rash    Adhesive tapes causes redness/itching/rash after removal  . Seasonal Ic [Cholestatin] Other (See Comments)    Unknown reaction    Physical Exam: Constitutional: in no apparent distress  Vitals:   12/22/18 1300 12/22/18 1459  BP: 119/90 125/81  Pulse: (!) 54 (!) 108  Resp: 15 19  Temp:  97.7 F (36.5 C)  SpO2: 95% 97%   EYES: anicteric ENMT: no thrush Cardiovascular: Cor RRR no murmur Respiratory: CTA B; normal respiratory effort GI: Bowel sounds are normal Musculoskeletal: right leg wrapped, some edema bilateral Skin: negatives: no rash Neuro:non-focal  Lab Results  Component Value Date   WBC 7.8 12/22/2018   HGB 11.3 (L) 12/22/2018   HCT 35.7 (L) 12/22/2018   MCV 99.4 12/22/2018   PLT 55 (L) 12/22/2018    Lab Results  Component Value Date   CREATININE 1.01 12/22/2018   BUN 44 (H) 12/22/2018   NA 136 12/22/2018   K 4.8 12/22/2018   CL 102 12/22/2018   CO2 24 12/22/2018    Lab Results  Component Value Date   ALT 160 (H) 12/22/2018   AST 115 (H) 12/22/2018   ALKPHOS 238 (H) 12/22/2018     Microbiology: Recent Results (from the past 240 hour(s))  Culture, blood (routine x 2)     Status: Abnormal (Preliminary  result)   Collection Time: 12/21/18  7:50 AM  Result Value Ref Range Status   Specimen Description   Final    BLOOD LEFT ARM Performed at [facility], [place] [address] [place], [place] [address]    Special Requests   Final    BOTTLES DRAWN AEROBIC AND ANAEROBIC Blood Culture adequate volume Performed at [facility] [address] [place], [place] [address]    Culture  Setup Time   Final  GRAM POSITIVE COCCI IN BOTH AEROBIC AND ANAEROBIC BOTTLES CRITICAL VALUE NOTED.  VALUE IS CONSISTENT WITH PREVIOUSLY REPORTED AND CALLED VALUE.    Culture (A)  Final    ENTEROCOCCUS FAECALIS SUSCEPTIBILITIES TO FOLLOW Performed at [facility], [place] [address] [place], [place] [address]    Report Status PENDING  Incomplete  Culture, blood (routine x 2)     Status: Abnormal (Preliminary result)   Collection Time: 12/21/18  7:51 AM  Result Value Ref Range Status   Specimen Description   Final    BLOOD RIGHT ARM Performed at [place] [address], [place], [place] [address]    Special Requests   Final    BOTTLES DRAWN AEROBIC AND ANAEROBIC Blood Culture adequate volume Performed at [facility] [address], [place], [place] [address]    Culture  Setup Time   Final    IN BOTH AEROBIC AND ANAEROBIC BOTTLES GRAM POSITIVE COCCI CRITICAL RESULT CALLED TO, READ BACK BY AND VERIFIED WITH: B GREEN PHARMD 12/21/18 2337 JDW    Culture (A)  Final    ENTEROCOCCUS FAECALIS SUSCEPTIBILITIES TO FOLLOW Performed at [facility], [place] [address], [place], [place] [address]    Report Status PENDING  Incomplete  Blood Culture ID Panel (Reflexed)     Status: Abnormal   Collection Time: 12/21/18  7:51 AM  Result Value Ref Range Status   Enterococcus species DETECTED (A) NOT DETECTED Final    Comment: CRITICAL RESULT CALLED TO, READ BACK BY AND VERIFIED WITH: B GREEN PHARMD 12/21/18 2337 JDW    Vancomycin  resistance NOT DETECTED NOT DETECTED Final   Listeria monocytogenes NOT DETECTED NOT DETECTED Final   Staphylococcus species NOT DETECTED NOT DETECTED Final   Staphylococcus aureus (BCID) NOT DETECTED NOT DETECTED Final   Streptococcus species NOT DETECTED NOT DETECTED Final   Streptococcus agalactiae NOT DETECTED NOT DETECTED Final   Streptococcus pneumoniae NOT DETECTED NOT DETECTED Final   Streptococcus pyogenes NOT DETECTED NOT DETECTED Final   Acinetobacter baumannii NOT DETECTED NOT DETECTED Final   Enterobacteriaceae species NOT DETECTED NOT DETECTED Final   Enterobacter cloacae complex NOT DETECTED NOT DETECTED Final   Escherichia coli NOT DETECTED NOT DETECTED Final   Klebsiella oxytoca NOT DETECTED NOT DETECTED Final   Klebsiella pneumoniae NOT DETECTED NOT DETECTED Final   Proteus species NOT DETECTED NOT DETECTED Final   Serratia marcescens NOT DETECTED NOT DETECTED Final   Haemophilus influenzae NOT DETECTED NOT DETECTED Final   Neisseria meningitidis NOT DETECTED NOT DETECTED Final   Pseudomonas aeruginosa NOT DETECTED NOT DETECTED Final   Candida albicans NOT DETECTED NOT DETECTED Final   Candida glabrata NOT DETECTED NOT DETECTED Final   Candida krusei NOT DETECTED NOT DETECTED Final   Candida parapsilosis NOT DETECTED NOT DETECTED Final   Candida tropicalis NOT DETECTED NOT DETECTED Final    Comment: Performed at [facility], [address], [place], [place] [address]  Culture, Urine     Status: None   Collection Time: 12/21/18  9:59 AM  Result Value Ref Range Status   Specimen Description   Final    URINE, CLEAN CATCH Performed at [facility], [place] [address] [place], [place] [address]    Special Requests   Final    NONE Performed at [facility], [place] [address] [place], [place] [address]    Culture   Final    NO GROWTH Performed at [facility], [place] [address] [place], [place] [address]    Report Status  12/22/2018 FINAL  Final  MRSA PCR Screening     Status: None   Collection Time: 12/21/18  2:48 PM  Result Value Ref Range Status   MRSA by PCR NEGATIVE NEGATIVE Final    Comment:        The GeneXpert MRSA Assay (FDA approved for NASAL specimens only), is one component of a comprehensive MRSA colonization surveillance program. It is not intended to diagnose MRSA infection nor to guide or monitor treatment for MRSA infections. Performed at [facility], [place] [address], [place], [place] [address]     Robert W Comer, MD [facility] for Infectious Disease [email or web address] 12/22/2018, 3:11 PM

## 2018-12-22 NOTE — Progress Notes (Addendum)
  Echocardiogram 2D Echocardiogram limited with definity has been performed.  Darlina Sicilian M 12/22/2018, 4:03 PM

## 2018-12-22 NOTE — Progress Notes (Signed)
Physical Therapy Treatment Patient Details Name: Robert Gregory MRN: [id number] DOB: [birth year] Today's Date: 12/22/2018    History of Present Illness Pt is a 78 y/o male admitted 12/03/18 with dizziness, bradycardia and near syncope. Pt with complete heart block and metastatic hepatocellular carinoma with T7 compression fx. Not a candidate for verterbroplasty, to begin palliative radiation at [facility] 3/20. PMH includes DM, MI, ICD, HTN, HLD, A-fib.    PT Comments    Pt very cooperative but requiring increased time and rest breaks for task completion - pt transferred to ICU yesterday.   Follow Up Recommendations  Home health PT;Supervision for mobility/OOB     Equipment Recommendations  None recommended by PT    Recommendations for Other Services       Precautions / Restrictions Precautions Precautions: Fall;Back Precaution Booklet Issued: No Precaution Comments: pt does not wish to use brace Required Braces or Orthoses: Spinal Brace Spinal Brace: Thoracolumbosacral orthotic Restrictions Weight Bearing Restrictions: No    Mobility  Bed Mobility Overal bed mobility: Needs Assistance Bed Mobility: Rolling;Sidelying to Sit;Sit to Sidelying Rolling: Min assist;Min guard Sidelying to sit: Min assist;Min guard     Sit to sidelying: Mod assist;+2 for safety/equipment General bed mobility comments: recalls log roll technique, self assists with bed rail, significant assist for feet onto bed per pt request  Transfers Overall transfer level: Needs assistance Equipment used: Rolling walker (2 wheeled) Transfers: Sit to/from Stand Sit to Stand: Min guard         General transfer comment: for safety:  pt shaky when standing  Ambulation/Gait Ambulation/Gait assistance: Min guard Gait Distance (Feet): 44 Feet(and 15' and 28) Assistive device: Rolling walker (2 wheeled) Gait Pattern/deviations: Step-through pattern;Decreased stride length;Trunk flexed Gait velocity: decr    General Gait Details: verbal cues for safety especially with turns, no LOB, pt shaky with increased fatigue   Stairs             Wheelchair Mobility    Modified Rankin (Stroke Patients Only)       Balance Overall balance assessment: Needs assistance Sitting-balance support: Feet supported;No upper extremity supported Sitting balance-Leahy Scale: Good     Standing balance support: Bilateral upper extremity supported Standing balance-Leahy Scale: Poor Standing balance comment: Pt reliant on UEs this date                            Cognition Arousal/Alertness: Awake/alert Behavior During Therapy: WFL for tasks assessed/performed Overall Cognitive Status: Within Functional Limits for tasks assessed                                 General Comments: pt in good spirits and pleasant       Exercises      General Comments General comments (skin integrity, edema, etc.): multiple sitting and standing rest breaks required for task completion      Pertinent Vitals/Pain Pain Assessment: Faces Faces Pain Scale: Hurts a little bit Pain Location: back  Pain Descriptors / Indicators: Discomfort;Sore Pain Intervention(s): Limited activity within patient's tolerance;Monitored during session;Premedicated before session    Home Living                      Prior Function            PT Goals (current goals can now be found in the care plan section) Acute Rehab PT Goals  Patient Stated Goal: home, hopefully Monday PT Goal Formulation: With patient Time For Goal Achievement: 12/27/18 Potential to Achieve Goals: Good Progress towards PT goals: Not progressing toward goals - comment(transferred to ICU yesterday)    Frequency    Min 3X/week      PT Plan Current plan remains appropriate    Co-evaluation PT/OT/SLP Co-Evaluation/Treatment: Yes Reason for Co-Treatment: For patient/therapist safety PT goals addressed during session:  Mobility/safety with mobility OT goals addressed during session: ADL's and self-care      AM-PAC PT "6 Clicks" Mobility   Outcome Measure  Help needed turning from your back to your side while in a flat bed without using bedrails?: A Little Help needed moving from lying on your back to sitting on the side of a flat bed without using bedrails?: A Little Help needed moving to and from a bed to a chair (including a wheelchair)?: A Little Help needed standing up from a chair using your arms (e.g., wheelchair or bedside chair)?: A Little Help needed to walk in hospital room?: A Little Help needed climbing 3-5 steps with a railing? : A Lot 6 Click Score: 17    End of Session Equipment Utilized During Treatment: Gait belt Activity Tolerance: Patient tolerated treatment well Patient left: in bed;with call bell/phone within reach Nurse Communication: Mobility status PT Visit Diagnosis: Muscle weakness (generalized) (M62.81);Unsteadiness on feet (R26.81)     Time: 8682-5749 PT Time Calculation (min) (ACUTE ONLY): 35 min  Charges:  $Gait Training: 8-22 mins                     [place] Pager [phone] Office [phone]    Ramandeep Arington 12/22/2018, 4:15 PM

## 2018-12-22 NOTE — Progress Notes (Signed)
  Echocardiogram 2D Echocardiogram has been performed.  Darlina Sicilian M 01/02/2019, 9:46 AM

## 2018-12-23 DIAGNOSIS — E785 Hyperlipidemia, unspecified: Secondary | ICD-10-CM

## 2018-12-23 LAB — COMPREHENSIVE METABOLIC PANEL
ALT: 149 U/L — ABNORMAL HIGH (ref 0–44)
AST: 83 U/L — ABNORMAL HIGH (ref 15–41)
Albumin: 3.1 g/dL — ABNORMAL LOW (ref 3.5–5.0)
Alkaline Phosphatase: 228 U/L — ABNORMAL HIGH (ref 38–126)
Anion gap: 10 (ref 5–15)
BUN: 46 mg/dL — ABNORMAL HIGH (ref 8–23)
CO2: 26 mmol/L (ref 22–32)
Calcium: 8.2 mg/dL — ABNORMAL LOW (ref 8.9–10.3)
Chloride: 102 mmol/L (ref 98–111)
Creatinine, Ser: 0.95 mg/dL (ref 0.61–1.24)
GFR calc Af Amer: >60 mL/min (ref >60)
GFR calc non Af Amer: >60 mL/min (ref >60)
Glucose, Bld: 304 mg/dL — ABNORMAL HIGH (ref 70–99)
Potassium: 4 mmol/L (ref 3.5–5.1)
Sodium: 138 mmol/L (ref 135–145)
Total Bilirubin: 1.7 mg/dL — ABNORMAL HIGH (ref 0.3–1.2)
Total Protein: 6 g/dL — ABNORMAL LOW (ref 6.5–8.1)

## 2018-12-23 LAB — CULTURE, BLOOD (ROUTINE X 2)
Special Requests: ADEQUATE
Special Requests: ADEQUATE

## 2018-12-23 LAB — GLUCOSE, CAPILLARY
Glucose-Capillary: 234 mg/dL — ABNORMAL HIGH (ref 70–99)
Glucose-Capillary: 278 mg/dL — ABNORMAL HIGH (ref 70–99)
Glucose-Capillary: 296 mg/dL — ABNORMAL HIGH (ref 70–99)
Glucose-Capillary: 315 mg/dL — ABNORMAL HIGH (ref 70–99)
Glucose-Capillary: 345 mg/dL — ABNORMAL HIGH (ref 70–99)
Glucose-Capillary: 362 mg/dL — ABNORMAL HIGH (ref 70–99)

## 2018-12-23 LAB — CBC
HCT: 34.5 % — ABNORMAL LOW (ref 39.0–52.0)
Hemoglobin: 10.9 g/dL — ABNORMAL LOW (ref 13.0–17.0)
MCH: 31 pg (ref 26.0–34.0)
MCHC: 31.6 g/dL (ref 30.0–36.0)
MCV: 98 fL (ref 80.0–100.0)
Platelets: 52 10*3/uL — ABNORMAL LOW (ref 150–400)
RBC: 3.52 MIL/uL — ABNORMAL LOW (ref 4.22–5.81)
RDW: 19.9 % — ABNORMAL HIGH (ref 11.5–15.5)
WBC: 5.5 10*3/uL (ref 4.0–10.5)
nRBC: 0 % (ref 0.0–0.2)

## 2018-12-23 LAB — PROCALCITONIN: Procalcitonin: 0.14 ng/mL

## 2018-12-23 MED ORDER — FENTANYL 25 MCG/HR TD PT72
1.0000 | MEDICATED_PATCH | TRANSDERMAL | Status: DC
  Administered 2018-12-23 – 2018-12-26 (×2): 1 via TRANSDERMAL
  Filled 2018-12-23 (×2): qty 1

## 2018-12-23 MED ORDER — INSULIN ASPART 100 UNIT/ML ~~LOC~~ SOLN
0.0000 [IU] | Freq: Three times a day (TID) | SUBCUTANEOUS | Status: DC
  Administered 2018-12-24: 7 [IU] via SUBCUTANEOUS
  Administered 2018-12-24: 12:00:00 11 [IU] via SUBCUTANEOUS
  Administered 2018-12-24: 4 [IU] via SUBCUTANEOUS

## 2018-12-23 MED ORDER — HYDROCORTISONE NA SUCCINATE PF 100 MG IJ SOLR
50.0000 mg | Freq: Two times a day (BID) | INTRAMUSCULAR | Status: DC
  Administered 2018-12-23 – 2018-12-24 (×2): 50 mg via INTRAVENOUS
  Filled 2018-12-23 (×2): qty 2

## 2018-12-23 MED ORDER — INSULIN ASPART 100 UNIT/ML ~~LOC~~ SOLN
0.0000 [IU] | Freq: Every day | SUBCUTANEOUS | Status: DC
  Administered 2018-12-23 – 2018-12-24 (×2): 2 [IU] via SUBCUTANEOUS

## 2018-12-23 NOTE — Progress Notes (Signed)
PROGRESS NOTE    Robert Gregory  JXB:[id number] DOB: 1941-09-20 DOA: 12/03/2018 PCP: Robert Carol, MD   Brief Narrative: Robert Gregory is a 78 y.o. male with a history of coronary artery disease status post CABG, status post pacemaker placement, atrial fibrillation on rivaroxaban, stage III CKD, obstructive sleep apnea, type 2 diabetes, Robert Gregory cirrhosis, recent diagnosis of metastatic hepatocellular carcinoma with disease to thoracic spine, hypertension. He was admitted secondary to near syncope which was secondary to complete heart block. Pacemaker adjusted but hospital course complicated by LE cellulitis and enterococcus bacteremia, in addition to severe sepsis requiring a brief course of vasopressor support. ID is on board.   Assessment & Plan:   Principal Problem:   Near syncope Active Problems:   Essential hypertension   Hyperlipidemia   Type 2 diabetes mellitus with stage 3 chronic kidney disease (HCC)   Chronic back pain   Obesity, Class III, BMI 40-49.9 (morbid obesity) (HCC)   Cellulitis of right lower extremity   Complete heart block (HCC)   Acute renal failure superimposed on stage 3 chronic kidney disease (HCC)   Hepatocellular carcinoma (HCC)   Severe sepsis Not present on admission. Secondary to enterococcus bacteremia. Unknown source but possibly hepatobiliary per chart review. He has had prolonged cellulitis which be a source. Associated hypotension that required a short course of vasopressors, IV fluids. Started on stress dose steroids -Wean steroids quickly -Continue treatment for bacteremia  Enterococcus bacteremia ID on board. Transthoracic Echocardiogram significant for no valve or pacer lead vegetations. -Continue Ampicillin per ID recommendations -ID recommendations: will likely need port removed and Transesophageal Echocardiogram  Elevated LFTs Trending down. Likely related to underlying liver cancer/hypotension. RUQ ultrasound significant for  cholelithiasis with gallbladder wall thickening/edema. Negative murphy sign.   Metastatic hepatocellular carcinoma T7 pathologic fracture Followed by oncology, Dr. [name]. Receiving radiation therapy. Port-A-Cath placement on 3/31. -Continue Tramadol, fentanyl patch, lidocaine patch, gabapentin  Acute metabolic vs toxic encephalopathy Secondary to medication vs sepsis vs combination per chart review. Resolved.  Acute on chronic diastolic heart failure Treated with IV lasix and has resolved. Patient takes lasix as an outpatient which has been held currently secondary to sepsis.  Diabetes mellitus, type 2 Patient is on metformin, U500 150 units at breakfast and 75 units at lunch. He reports hypoglycemia generally in the late afternoon/evening. Currently with CBG in 300 ranges. No acidosis or elevated anion gap. -Switched to cab modified diet -Continue Lantus, continue novolog 10 units TID with meals -Continue SSI, but may increase to resistant scale  Bilateral lower extremity cellulitis Per chart review, resolved. Will need to reexamine right LE, however, LE appears resolved.  Dysphagia S/p sleep study on chart review. Nonspecific esophageal dysmotility. Carb/heart healthy diet  CAD Essential hypertension S/p CABG. On Coreg as an outpatient. Currently held secondary to recent hypotension  Hyperlipidemia -Continue pravastatin   DVT prophylaxis: Xarelto Code Status:   Code Status: DNR Family Communication: None at bedside Disposition Plan: Home pending continued management/workup for bacteremia. Home with HHPT/OT   Consultants:   Infectious disease  Oncology  Procedures:   Pacemaker transition from VVI to DDD pacing on 12/03/2018  Esophagram 12/05/2018 nonspecific esophageal motility disorder  Antimicrobials:  P.o. cephalexin started 12/08/2018 to 12/12/2018  IV cefazolin 12/03/2020 to 12/08/2018  IV vancomycin and Zosyn 12/03/2018 to 12/04/2018  IV ampicillin started  for 12/08/2018 ongoing    Subjective: No issues today.  Objective: Vitals:   12/22/18 1300 12/22/18 1459 12/22/18 2042 12/23/18 0558  BP: 119/90 125/81  128/69 131/87  Pulse: (!) 54 (!) 108 92 87  Resp: 15 19 18 18   Temp:  97.7 F (36.5 C) 98.5 F (36.9 C) 97.8 F (36.6 C)  TempSrc:  Oral Oral Oral  SpO2: 95% 97% 91% 94%  Weight:      Height:        Intake/Output Summary (Last 24 hours) at 12/23/2018 1014 Last data filed at 12/23/2018 0604 Gross per 24 hour  Intake 1080 ml  Output 1600 ml  Net -520 ml   Filed Weights   12/08/18 0509 12/09/18 0600 12/21/18 1300  Weight: (!) 143.2 kg (!) 146.3 kg (!) 137.5 kg    Examination:  General exam: Appears calm and comfortable  Respiratory system: Clear to auscultation. Respiratory effort normal. Cardiovascular system: S1 & S2 heard, RRR. No murmurs, rubs, gallops or clicks. Gastrointestinal system: Abdomen is nondistended, soft and nontender. No organomegaly or masses felt. Normal bowel sounds heard. Central nervous system: Alert and oriented. No focal neurological deficits. Extremities: Edema. No calf tenderness. Right leg with cdi dressing/wrap Skin: No cyanosis. No rashes Psychiatry: Judgement and insight appear normal. Mood & affect appropriate.     Data Reviewed: I have personally reviewed following labs and imaging studies  CBC: Recent Labs  Lab 12/18/18 0523 12/19/18 0411 12/21/18 0418 12/22/18 0500 12/23/18 0432  WBC 6.9 6.7 7.5 7.8 5.5  NEUTROABS 5.5 5.6  --   --   --   HGB 11.5* 11.5* 12.8* 11.3* 10.9*  HCT 36.5* 37.0* 40.7 35.7* 34.5*  MCV 99.2 97.9 98.3 99.4 98.0  PLT 79* 70* 57* 55* 52*   Basic Metabolic Panel: Recent Labs  Lab 12/18/18 0523 12/19/18 0411 12/20/18 0440 12/21/18 0418 12/22/18 0500 12/23/18 0432  NA 137   138 137 136 134* 136 138  K 4.2   4.2 4.3 4.4 5.0 4.8 4.0  CL 95*   95* 96* 96* 95* 102 102  CO2 31   32 31 31 28 24 26   GLUCOSE 188*   194* 227* 225* 183* 210* 304*  BUN 41*    42* 41* 45* 40* 44* 46*  CREATININE 0.95   0.98 0.93 0.96 0.92 1.01 0.95  CALCIUM 9.0   9.2 9.1 9.1 9.3 8.0* 8.2*  MG 2.3  --   --   --   --   --    GFR: Estimated Creatinine Clearance: 94.8 mL/min (by C-G formula based on SCr of 0.95 mg/dL). Liver Function Tests: Recent Labs  Lab 12/18/18 0523 12/21/18 0418 12/22/18 0500 12/23/18 0432  AST 131* 163* 115* 83*  ALT 171* 211* 160* 149*  ALKPHOS 277* 337* 238* 228*  BILITOT 1.5* 2.2* 2.4* 1.7*  PROT 6.3* 7.1 5.7* 6.0*  ALBUMIN 3.5 3.7 3.1* 3.1*   No results for input(s): LIPASE, AMYLASE in the last 168 hours. No results for input(s): AMMONIA in the last 168 hours. Coagulation Profile: Recent Labs  Lab 12/19/18 0411  INR 1.2   Cardiac Enzymes: No results for input(s): CKTOTAL, CKMB, CKMBINDEX, TROPONINI in the last 168 hours. BNP (last 3 results) No results for input(s): PROBNP in the last 8760 hours. HbA1C: No results for input(s): HGBA1C in the last 72 hours. CBG: Recent Labs  Lab 12/21/18 2116 12/22/18 0827 12/22/18 1201 12/23/18 0026 12/23/18 0731  GLUCAP 165* 205* 322* 345* 315*   Lipid Profile: No results for input(s): CHOL, HDL, LDLCALC, TRIG, CHOLHDL, LDLDIRECT in the last 72 hours. Thyroid Function Tests: No results for input(s): TSH, T4TOTAL, FREET4, T3FREE, THYROIDAB  in the last 72 hours. Anemia Panel: No results for input(s): VITAMINB12, FOLATE, FERRITIN, TIBC, IRON, RETICCTPCT in the last 72 hours. Sepsis Labs: Recent Labs  Lab 12/21/18 0750 12/22/18 0500 12/23/18 0432  PROCALCITON 0.20 0.26 0.14    Recent Results (from the past 240 hour(s))  Culture, blood (routine x 2)     Status: Abnormal   Collection Time: 12/21/18  7:50 AM  Result Value Ref Range Status   Specimen Description   Final    BLOOD LEFT ARM Performed at [facility] [address], [place], [place] [address]    Special Requests   Final    BOTTLES DRAWN AEROBIC AND ANAEROBIC Blood Culture adequate  volume Performed at [facility] [address] [place], [place] [address]    Culture  Setup Time   Final    GRAM POSITIVE COCCI IN BOTH AEROBIC AND ANAEROBIC BOTTLES CRITICAL VALUE NOTED.  VALUE IS CONSISTENT WITH PREVIOUSLY REPORTED AND CALLED VALUE.    Culture (A)  Final    ENTEROCOCCUS FAECALIS SUSCEPTIBILITIES PERFORMED ON PREVIOUS CULTURE WITHIN THE LAST 5 DAYS. Performed at [facility], [place] [address], [place], [place] [address]    Report Status 12/23/2018 FINAL  Final  Culture, blood (routine x 2)     Status: Abnormal   Collection Time: 12/21/18  7:51 AM  Result Value Ref Range Status   Specimen Description   Final    BLOOD RIGHT ARM Performed at [facility] [address], [place], [place] [address]    Special Requests   Final    BOTTLES DRAWN AEROBIC AND ANAEROBIC Blood Culture adequate volume Performed at [facility] [address], [place], [place] [address]    Culture  Setup Time   Final    IN BOTH AEROBIC AND ANAEROBIC BOTTLES GRAM POSITIVE COCCI CRITICAL RESULT CALLED TO, READ BACK BY AND VERIFIED WITH: B GREEN PHARMD 12/21/18 2337 JDW Performed at [facility], [place] [address], [place], [place] [address]    Culture ENTEROCOCCUS FAECALIS (A)  Final   Report Status 12/23/2018 FINAL  Final   Organism ID, Bacteria ENTEROCOCCUS FAECALIS  Final      Susceptibility   Enterococcus faecalis - MIC*    AMPICILLIN <=2 SENSITIVE Sensitive     VANCOMYCIN 1 SENSITIVE Sensitive     GENTAMICIN SYNERGY SENSITIVE Sensitive     * ENTEROCOCCUS FAECALIS  Blood Culture ID Panel (Reflexed)     Status: Abnormal   Collection Time: 12/21/18  7:51 AM  Result Value Ref Range Status   Enterococcus species DETECTED (A) NOT DETECTED Final    Comment: CRITICAL RESULT CALLED TO, READ BACK BY AND VERIFIED WITH: B GREEN PHARMD 12/21/18 2337 JDW    Vancomycin resistance NOT DETECTED NOT DETECTED Final   Listeria  monocytogenes NOT DETECTED NOT DETECTED Final   Staphylococcus species NOT DETECTED NOT DETECTED Final   Staphylococcus aureus (BCID) NOT DETECTED NOT DETECTED Final   Streptococcus species NOT DETECTED NOT DETECTED Final   Streptococcus agalactiae NOT DETECTED NOT DETECTED Final   Streptococcus pneumoniae NOT DETECTED NOT DETECTED Final   Streptococcus pyogenes NOT DETECTED NOT DETECTED Final   Acinetobacter baumannii NOT DETECTED NOT DETECTED Final   Enterobacteriaceae species NOT DETECTED NOT DETECTED Final   Enterobacter cloacae complex NOT DETECTED NOT DETECTED Final   Escherichia coli NOT DETECTED NOT DETECTED Final   Klebsiella oxytoca NOT DETECTED NOT DETECTED Final   Klebsiella pneumoniae NOT DETECTED  NOT DETECTED Final   Proteus species NOT DETECTED NOT DETECTED Final   Serratia marcescens NOT DETECTED NOT DETECTED Final   Haemophilus influenzae NOT DETECTED NOT DETECTED Final   Neisseria meningitidis NOT DETECTED NOT DETECTED Final   Pseudomonas aeruginosa NOT DETECTED NOT DETECTED Final   Candida albicans NOT DETECTED NOT DETECTED Final   Candida glabrata NOT DETECTED NOT DETECTED Final   Candida krusei NOT DETECTED NOT DETECTED Final   Candida parapsilosis NOT DETECTED NOT DETECTED Final   Candida tropicalis NOT DETECTED NOT DETECTED Final    Comment: Performed at [facility], [place] [address], [place], [place] [address]  Culture, Urine     Status: None   Collection Time: 12/21/18  9:59 AM  Result Value Ref Range Status   Specimen Description   Final    URINE, CLEAN CATCH Performed at [facility], [place] [address] [place], [place] [address]    Special Requests   Final    NONE Performed at [facility], [place] [address], [place], [place] [address]    Culture   Final    NO GROWTH Performed at [facility], [place] [address], [place], [place] [address]    Report Status 12/22/2018 FINAL  Final  MRSA PCR Screening     Status:  None   Collection Time: 12/21/18  2:48 PM  Result Value Ref Range Status   MRSA by PCR NEGATIVE NEGATIVE Final    Comment:        The GeneXpert MRSA Assay (FDA approved for NASAL specimens only), is one component of a comprehensive MRSA colonization surveillance program. It is not intended to diagnose MRSA infection nor to guide or monitor treatment for MRSA infections. Performed at [facility], [place] [address] [place], [place] [address]          Radiology Studies: Dg Chest Port 1 View  Result Date: 12/22/2018 CLINICAL DATA:  Hypoxia. EXAM: PORTABLE CHEST 1 VIEW COMPARISON:  12/21/2018 FINDINGS: Pacemaker and right-sided power port remains in place. Heart size is within normal limits allowing for portable technique and low lung volumes. Prior CABG. Both lungs are grossly clear. No evidence of pleural effusion. IMPRESSION: Low lung volumes.  No acute findings. Electronically Signed   By: Earle Gell M.D.   On: 12/22/2018 08:17   US Abdomen Limited Ruq  Result Date: 12/21/2018 CLINICAL DATA:  Elevated liver function test. EXAM: ULTRASOUND ABDOMEN LIMITED RIGHT UPPER QUADRANT COMPARISON:  CT, 12/01/2018 FINDINGS: Gallbladder: Dependent gallstones. Wall is thickened and edematous measuring 9 mm. No sonographic Murphy's sign. Common bile duct: Diameter: 5-6 mm.  Not seen distally. Liver: Heterogeneous coarsened echotexture. Nodular contour. 2 discrete echogenic nodules noted, 3.6 x 3.1 x 3.2 cm in the right lobe 3.9 x 1.3 x 3.2 cm anterior, left lobe. These are not well-defined. Portal vein is patent but demonstrates to and fro blood flow. Small amount of ascites. IMPRESSION: 1. Cholelithiasis and gallbladder wall thickening/edema. Gallbladder wall thickening is nonspecific in the setting of ascites. No convincing acute cholecystitis. No sonographic Murphy's sign. 2. Cirrhosis with liver masses. These masses are better defined on the recent CT scan, definite for  hepatocellular carcinoma as reported on the prior CT. 3. Small amount of ascites. Electronically Signed   By: Lajean Manes M.D.   On: 12/21/2018 16:53        Scheduled Meds:  calcitonin (salmon)  1 spray Alternating Nares Daily   Chlorhexidine Gluconate Cloth  6 each  Topical Daily   cholecalciferol  2,000 Units Oral Q supper   ezetimibe  10 mg Oral Daily   feeding supplement (GLUCERNA SHAKE)  237 mL Oral TID BM   feeding supplement (PRO-STAT SUGAR FREE 64)  30 mL Oral TID BM   fenofibrate  160 mg Oral Daily   fentaNYL  1 patch Transdermal Q72H   gabapentin  400 mg Oral TID   hydrocortisone sod succinate (SOLU-CORTEF) inj  50 mg Intravenous Q6H   insulin aspart  0-9 Units Subcutaneous TID WC   insulin aspart  10 Units Subcutaneous TID WC   insulin glargine  20 Units Subcutaneous BID   lidocaine  1 patch Transdermal Q24H   mouth rinse  15 mL Mouth Rinse BID   multivitamin with minerals  1 tablet Oral Daily   nystatin  5 mL Oral 6 X Daily   omega-3 acid ethyl esters  2 g Oral BID   pravastatin  80 mg Oral QHS   rivaroxaban  20 mg Oral Q supper   saccharomyces boulardii  250 mg Oral Daily   senna-docusate  2 tablet Oral BID   sodium bicarbonate/sodium chloride   Mouth Rinse QID   sodium chloride flush  10-40 mL Intracatheter Q12H   sodium chloride flush  3 mL Intravenous Q12H   Continuous Infusions:  ampicillin (OMNIPEN) IV 2 g (12/23/18 0825)   norepinephrine (LEVOPHED) Adult infusion 0 mcg/min (12/22/18 0842)     LOS: 20 days     Cordelia Poche, MD [facility] 12/23/2018, 10:14 AM  If 7PM-7AM, please contact night-coverage [email or web address]

## 2018-12-23 NOTE — Progress Notes (Signed)
Physical Therapy Treatment Patient Details Name: Robert Gregory MRN: [id number] DOB: 1941-07-10 Today's Date: 12/23/2018    History of Present Illness Pt is a 78 y/o male admitted 12/03/18 with dizziness, bradycardia and near syncope. Pt with complete heart block and metastatic hepatocellular carinoma with T7 compression fx. Not a candidate for verterbroplasty, to begin palliative radiation at [facility] 3/20.   4/2 in the am developed fever and hypotension-->Transferred to ICU, back to floor 4/3. PMH includes DM, MI, ICD, HTN, HLD, A-fib.    PT Comments    Pt is progressing with PT; continues to fatigue although incr  Gait distance today, mildly tachy with HR 90 to 133 max during amb;  Will continue to follow with goal of d/c home with HHPT (likely next week per MD notes), pt has last XRT on Monday  Follow Up Recommendations  Home health PT;Supervision for mobility/OOB     Equipment Recommendations  None recommended by PT    Recommendations for Other Services       Precautions / Restrictions Precautions Precautions: Fall;Back Precaution Booklet Issued: No Precaution Comments: pt does not wish to use brace Required Braces or Orthoses: Spinal Brace Spinal Brace: Thoracolumbosacral orthotic Restrictions Weight Bearing Restrictions: No    Mobility  Bed Mobility Overal bed mobility: Needs Assistance Bed Mobility: Rolling;Sidelying to Sit Rolling: Min guard;Min assist Sidelying to sit: Min assist;Min guard       General bed mobility comments: recalls log roll technique, cues to complete;  self assists with bed rail, light assist/tactile input to elevate trunk completely  Transfers Overall transfer level: Needs assistance Equipment used: Rolling walker (2 wheeled) Transfers: Sit to/from Stand Sit to Stand: Min guard;Supervision         General transfer comment: cues to back up to surface, pt self corrects for hand placement   Ambulation/Gait Ambulation/Gait assistance:  Min guard Gait Distance (Feet): 120 Feet(x2) Assistive device: Rolling walker (2 wheeled) Gait Pattern/deviations: Step-through pattern;Decreased stride length;Trunk flexed     General Gait Details: verbal uces for self awareness of fatigue/DOE; seated rest  required between distances   Stairs             Wheelchair Mobility    Modified Rankin (Stroke Patients Only)       Balance             Standing balance-Leahy Scale: Fair Standing balance comment: able to stand statically for 15 sec without UE support                            Cognition Arousal/Alertness: Awake/alert Behavior During Therapy: WFL for tasks assessed/performed Overall Cognitive Status: Within Functional Limits for tasks assessed                                 General Comments: pt in good spirits and pleasant       Exercises      General Comments        Pertinent Vitals/Pain Pain Assessment: No/denies pain    Home Living                      Prior Function            PT Goals (current goals can now be found in the care plan section) Acute Rehab PT Goals Patient Stated Goal: home, hopefully Monday PT Goal Formulation: With patient  Time For Goal Achievement: 12/27/18 Potential to Achieve Goals: Good Progress towards PT goals: Progressing toward goals    Frequency    Min 3X/week      PT Plan Current plan remains appropriate    Co-evaluation              AM-PAC PT "6 Clicks" Mobility   Outcome Measure  Help needed turning from your back to your side while in a flat bed without using bedrails?: A Little Help needed moving from lying on your back to sitting on the side of a flat bed without using bedrails?: A Little Help needed moving to and from a bed to a chair (including a wheelchair)?: A Little Help needed standing up from a chair using your arms (e.g., wheelchair or bedside chair)?: A Little Help needed to walk in hospital  room?: A Little Help needed climbing 3-5 steps with a railing? : A Lot 6 Click Score: 17    End of Session   Activity Tolerance: Patient tolerated treatment well Patient left: in chair;with call bell/phone within reach;with chair alarm set Nurse Communication: Mobility status PT Visit Diagnosis: Muscle weakness (generalized) (M62.81);Unsteadiness on feet (R26.81)     Time: 8978-4784 PT Time Calculation (min) (ACUTE ONLY): 30 min  Charges:  $Gait Training: 23-37 mins                     Kenyon Ana, PT  Pager: [phone] Acute Rehab Dept [facility]): [phone]   12/23/2018    [facility] 12/23/2018, 12:09 PM

## 2018-12-23 NOTE — Progress Notes (Signed)
Occupational Therapy Treatment Patient Details Name: Robert Gregory MRN: [id number] DOB: 1940-11-28 Today's Date: 12/23/2018    History of present illness Pt is a 78 y/o male admitted 12/03/18 with dizziness, bradycardia and near syncope. Pt with complete heart block and metastatic hepatocellular carinoma with T7 compression fx. Not a candidate for verterbroplasty, to begin palliative radiation at [facility] 3/20.   4/2 in the am developed fever and hypotension-->Transferred to ICU, back to floor 4/3. PMH includes DM, MI, ICD, HTN, HLD, A-fib.      Follow Up Recommendations  Supervision/Assistance - 24 hour;Home health OT    Equipment Recommendations  (pt has toilet riser)    Recommendations for Other Services      Precautions / Restrictions Precautions Precautions: Fall;Back Precaution Booklet Issued: No Precaution Comments: pt does not wish to use brace Required Braces or Orthoses: Spinal Brace Spinal Brace: Thoracolumbosacral orthotic Restrictions Weight Bearing Restrictions: No       Mobility Bed Mobility Overal bed mobility: Needs Assistance Bed Mobility: Rolling;Sidelying to Sit Rolling: Min guard;Min assist Sidelying to sit: Min assist;Min guard       General bed mobility comments: Pt OOB  Transfers Overall transfer level: Needs assistance Equipment used: Rolling walker (2 wheeled) Transfers: Sit to/from [facility] Sit to Stand: Min assist Stand pivot transfers: Min assist       General transfer comment: cues to back up to surface, pt self corrects for hand placement     Balance             Standing balance-Leahy Scale: Fair Standing balance comment: able to stand statically for 15 sec without UE support                           ADL either performed or assessed with clinical judgement   ADL   Eating/Feeding: Independent;Sitting   Grooming: Wash/dry hands;Min guard;Standing                                  General ADL Comments: Pt OOB. Pt did agree to BUE sitting  in chair     Vision Patient Visual Report: No change from baseline            Cognition Arousal/Alertness: Awake/alert Behavior During Therapy: WFL for tasks assessed/performed Overall Cognitive Status: Within Functional Limits for tasks assessed                                 General Comments: pt in good spirits and pleasant                    Pertinent Vitals/ Pain       Pain Assessment: No/denies pain Pain Score: 0-No pain         Frequency  Min 2X/week        Progress Toward Goals  OT Goals(current goals can now be found in the care plan section)  Progress towards OT goals: Progressing toward goals  Acute Rehab OT Goals Patient Stated Goal: home, hopefully Monday  Plan Discharge plan remains appropriate    Co-evaluation                 AM-PAC OT "6 Clicks" Daily Activity     Outcome Measure   Help from another person eating meals?: None Help from another person  taking care of personal grooming?: A Little Help from another person toileting, which includes using toliet, bedpan, or urinal?: A Little Help from another person bathing (including washing, rinsing, drying)?: A Little Help from another person to put on and taking off regular upper body clothing?: A Little Help from another person to put on and taking off regular lower body clothing?: A Little 6 Click Score: 19    End of Session    OT Visit Diagnosis: Unsteadiness on feet (R26.81);Other abnormalities of gait and mobility (R26.89);Muscle weakness (generalized) (M62.81)   Activity Tolerance Patient tolerated treatment well;Other (comment)(lunch came and pt wanted to eat)   Patient Left with call bell/phone within reach;in chair;with chair alarm set   Nurse Communication Mobility status        Time: 1250-1306 OT Time Calculation (min): 16 min  Charges: OT General Charges $OT Visit: 1 Visit OT  Treatments $Self Care/Home Management : 8-22 mins  Kari Baars, [place] [phone] Office- [place], Edwena Felty D 12/23/2018, 2:34 PM

## 2018-12-23 NOTE — Progress Notes (Signed)
For right now, [name] is looking a whole lot better.  In 24 hours, he is come around quite nicely.  It is apparent that the enterococcus in his blood is sensitive to antibiotics.  He is alert.  He is really back to his baseline.  He is not complaining of a lot of pain.  He has 1 more dose of radiation.  This will be on Monday.  Hopefully, he might be able to go home after that.  His liver tests are improved.  I suspect that he probably had a little bit of "shock liver" because of low blood pressure.  Again this is encouraging.  His alpha-fetoprotein has gone up quite a bit.  It is now 6000 He is eating.  There is no nausea or vomiting.  He is still having some loose stools.  This may be the reason that he developed this enterococcus infection.  I think with him being more mobile, we can probably cut back on his stool softeners.  His CBC shows white count 5.5.  Hemoglobin 10.9.  Platelet count 52,000.  His sodium is 138.  Potassium 4.0.  Blood sugar 304.  Creatinine 0.95.  Bilirubin is 1.7.  He is afebrile.  As such, I do not think that there is issues with colonization of the Port-A-Cath by the enterococcus bacteria.  His physical exam shows no thrush.  He has clear breath sounds bilaterally.  Cardiac exam is regular rate and rhythm.  Abdomen is morbidly obese but soft.  Bowel sounds are present.  There is no guarding or rebound tenderness.  Extremities shows no edema.  Neurological exam is nonfocal.  Again, [name] has a metastatic liver cancer.  He has hepatic steatosis leading to cirrhosis.  He is getting radiation therapy to his back.  He now developed the enterococcus bacteremia.  This is much improved.  I think that one question is whether or not he will be on IV antibiotics at home or whether he can be placed on oral antibiotics for the enterococcus bacteremia.  He is very happy to be back on 4 E.  Lattie Haw, MD  Oswaldo Milian 41:10

## 2018-12-24 LAB — CREATININE, SERUM
Creatinine, Ser: 0.83 mg/dL (ref 0.61–1.24)
GFR calc Af Amer: >60 mL/min (ref >60)
GFR calc non Af Amer: >60 mL/min (ref >60)

## 2018-12-24 LAB — MAGNESIUM: Magnesium: 2.6 mg/dL — ABNORMAL HIGH (ref 1.7–2.4)

## 2018-12-24 LAB — AMMONIA: Ammonia: 53 umol/L — ABNORMAL HIGH (ref 9–35)

## 2018-12-24 LAB — GLUCOSE, CAPILLARY
Glucose-Capillary: 211 mg/dL — ABNORMAL HIGH (ref 70–99)
Glucose-Capillary: 263 mg/dL — ABNORMAL HIGH (ref 70–99)
Glucose-Capillary: 185 mg/dL — ABNORMAL HIGH (ref 70–99)
Glucose-Capillary: 209 mg/dL — ABNORMAL HIGH (ref 70–99)

## 2018-12-24 MED ORDER — INSULIN GLARGINE 100 UNIT/ML ~~LOC~~ SOLN
25.0000 [IU] | Freq: Two times a day (BID) | SUBCUTANEOUS | Status: DC
  Administered 2018-12-24 – 2018-12-28 (×6): 25 [IU] via SUBCUTANEOUS
  Filled 2018-12-24 (×11): qty 0.25

## 2018-12-24 MED ORDER — LACTULOSE 10 GM/15ML PO SOLN
10.0000 g | Freq: Two times a day (BID) | ORAL | Status: DC
  Administered 2018-12-24 – 2018-12-28 (×5): 10 g via ORAL
  Filled 2018-12-24 (×6): qty 15

## 2018-12-24 MED ORDER — HYDROCORTISONE NA SUCCINATE PF 100 MG IJ SOLR
50.0000 mg | Freq: Every day | INTRAMUSCULAR | Status: DC
  Administered 2018-12-25: 09:00:00 50 mg via INTRAVENOUS
  Filled 2018-12-24: qty 2

## 2018-12-24 NOTE — Progress Notes (Signed)
Pt had 18 beat run of vtach. Charge RN aware. VSS and patient alert and oriented and "feels great" upon assessment. Leads reattached. On call Blount paged and made aware. Awaiting response    12/24/18 1956  Vitals  Temp 97.7 F (36.5 C)  Temp Source Oral  BP 139/76  MAP (mmHg) 94  BP Location Left Arm  BP Method Automatic  Patient Position (if appropriate) Lying  Pulse Rate 84  Pulse Rate Source Monitor  Resp 16  Oxygen Therapy  SpO2 97 %  O2 Device Room Air  Pain Assessment  Pain Scale 0-10  Pain Score 0  MEWS Score  MEWS RR 0  MEWS Pulse 0  MEWS Systolic 0  MEWS LOC 0  MEWS Temp 0  MEWS Score 0  MEWS Score Color Green  Provider Notification  Provider Name/Title Blount  Date Provider Notified 12/24/18  Time Provider Notified 2003  Notification Type Page  Notification Reason Other (Comment) (18 beat run of vtach)

## 2018-12-24 NOTE — Progress Notes (Signed)
Pt experienced a 16 beat run of V-tach at 1015. Vitals are stable and pt remains asymptomatic. MD notified, will continue to monitor.

## 2018-12-24 NOTE — Progress Notes (Signed)
Physical Therapy Treatment Patient Details Name: Robert Gregory MRN: [id number] DOB: 1941/08/02 Today's Date: 12/24/2018    History of Present Illness Pt is a 78 y/o male admitted 12/03/18 with dizziness, bradycardia and near syncope. Pt with complete heart block and metastatic hepatocellular carinoma with T7 compression fx. Not a candidate for verterbroplasty, to begin palliative radiation at [facility] 3/20.   4/2 in the am developed fever and hypotension-->Transferred to ICU, back to floor 4/3. PMH includes DM, MI, ICD, HTN, HLD, A-fib.    PT Comments    Pt progressing toward PT goals however mildly confused this pm (RN aware/discussed with PT); amb 65' x2 working on turns and maneuvering in tighter spaces; pt demo's improved RW safety  Follow Up Recommendations  Home health PT;Supervision for mobility/OOB     Equipment Recommendations  None recommended by PT    Recommendations for Other Services       Precautions / Restrictions Precautions Precautions: Fall;Back Precaution Booklet Issued: No Precaution Comments: pt does not wish to use brace Required Braces or Orthoses: Spinal Brace Spinal Brace: Thoracolumbosacral orthotic Restrictions Weight Bearing Restrictions: No    Mobility  Bed Mobility Overal bed mobility: Needs Assistance Bed Mobility: Rolling;Sidelying to Sit;Sit to Sidelying Rolling: Min guard;Min assist Sidelying to sit: Min assist;Min guard     Sit to sidelying: Min assist General bed mobility comments: cues to log roll, incr time, assist to bring LEs into bed  Transfers Overall transfer level: Needs assistance Equipment used: Rolling walker (2 wheeled) Transfers: Sit to/from Stand Sit to Stand: Min guard Stand pivot transfers: Min assist       General transfer comment: cues for hand placement and safe technique  Ambulation/Gait Ambulation/Gait assistance: Min guard Gait Distance (Feet): 70 Feet(x2) Assistive device: Rolling walker (2  wheeled) Gait Pattern/deviations: Step-through pattern;Decreased stride length;Trunk flexed Gait velocity: decr    General Gait Details: obstacle course set up in hallway for second 70', pt oriented to room number prior to session, able to recall number and locate room with min cues     Stairs             Wheelchair Mobility    Modified Rankin (Stroke Patients Only)       Balance Overall balance assessment: Needs assistance Sitting-balance support: Feet supported;No upper extremity supported Sitting balance-Leahy Scale: Good     Standing balance support: Bilateral upper extremity supported Standing balance-Leahy Scale: Fair Standing balance comment: able to stand statically for 20 sec without UE support--close supervision for safety                            Cognition Arousal/Alertness: Awake/alert Behavior During Therapy: WFL for tasks assessed/performed Overall Cognitive Status: Impaired/Different from baseline Area of Impairment: Orientation;Following commands                 Orientation Level: Disoriented to;Place;Time;Situation     Following Commands: Follows one step commands with increased time       General Comments: pt stated he was at [facility], did not know the day/month/year;  Pt able to verbalize location correctly later in session  after being oriented      Exercises General Exercises - Lower Extremity Ankle Circles/Pumps: AROM;Both;10 reps    General Comments        Pertinent Vitals/Pain Pain Assessment: No/denies pain    Home Living  Prior Function            PT Goals (current goals can now be found in the care plan section) Acute Rehab PT Goals Patient Stated Goal: home, hopefully Monday PT Goal Formulation: With patient Time For Goal Achievement: 12/27/18 Potential to Achieve Goals: Good Progress towards PT goals: Progressing toward goals    Frequency    Min  3X/week      PT Plan Current plan remains appropriate    Co-evaluation              AM-PAC PT "6 Clicks" Mobility   Outcome Measure  Help needed turning from your back to your side while in a flat bed without using bedrails?: A Little Help needed moving from lying on your back to sitting on the side of a flat bed without using bedrails?: A Little Help needed moving to and from a bed to a chair (including a wheelchair)?: A Little Help needed standing up from a chair using your arms (e.g., wheelchair or bedside chair)?: A Little Help needed to walk in hospital room?: A Little Help needed climbing 3-5 steps with a railing? : A Lot 6 Click Score: 17    End of Session Equipment Utilized During Treatment: Gait belt Activity Tolerance: Patient tolerated treatment well Patient left: in bed;with call bell/phone within reach;with bed alarm set Nurse Communication: Mobility status PT Visit Diagnosis: Muscle weakness (generalized) (M62.81);Unsteadiness on feet (R26.81)     Time: 8251-8984 PT Time Calculation (min) (ACUTE ONLY): 19 min  Charges:  $Gait Training: 8-22 mins                     Kenyon Ana, PT  Pager: [phone] Acute Rehab Dept [facility]): [phone]   12/24/2018    [facility] 12/24/2018, 4:13 PM

## 2018-12-24 NOTE — Progress Notes (Signed)
Attending cardiology review of TEE request, discussed with attending Dr. Lonny Prude:  Due to the spread of COVID-19, departmental policy is to review orders for procedures and determine appropriateness regarding testing at this time. The following criteria are being used to limit potential exposure and spread of infection.  Is the patient being evaluated for COVID-19 infection: no  Is the patient actively having infectious respiratory symptoms: no Does the patient have a known history of prior cardiovascular disease: yes  Patient with history of CAD s/p CABG, PPM, afib on rivaroxaban, stage 3 CKD, OSA, cirrhosis with metastatic hepatocellular carcinoma who is being managed for enterococcal bacteremia, complicate briefly by severe sepsis.  TEE would affect duration of antibiotics and, if leads involved, discussed of need for lead extraction. Given his OSA and cirrhosis, with recent hypotension requiring pressors, this should be performed under anesthesia.  Discussed that we will discuss policy of patient transfer from [facility] to [facility] with the endoscopy division tomorrow. OK to make patient NPO tonight, but we cannot confirm timing of procedure until anesthesia/endoscopy schedules reviewed. Will also need to consent patient in AM. Does have history of nonspecific esophageal dysmotility but tolerating PO.  Robert Dresser, MD, PhD [facility]  [address] [place], [place] [address] [phone]

## 2018-12-24 NOTE — Progress Notes (Signed)
Pt presenting with increased intermittent confusion, unable to answer orientation questions at times but reorients easily. Pt also states he feels confused at times. Neuro status and vital signs stable and unchanged, ambulated in hall with PT with no difficulty. MD notified, will continue to monitor.

## 2018-12-24 NOTE — Progress Notes (Signed)
Pt Foley discontinued, voided 379 mL with no complications. MD notified of elevated magnesium and ammonia results.

## 2018-12-24 NOTE — Progress Notes (Signed)
PROGRESS NOTE    Robert Gregory  PYP:[id number] DOB: 11-Jun-1941 DOA: 12/03/2018 PCP: Seward Carol, MD   Brief Narrative: Robert Gregory is a 78 y.o. male with a history of coronary artery disease status post CABG, status post pacemaker placement, atrial fibrillation on rivaroxaban, stage III CKD, obstructive sleep apnea, type 2 diabetes, Robert Gregory cirrhosis, recent diagnosis of metastatic hepatocellular carcinoma with disease to thoracic spine, hypertension. He was admitted secondary to near syncope which was secondary to complete heart block. Pacemaker adjusted but hospital course complicated by LE cellulitis and enterococcus bacteremia, in addition to severe sepsis requiring a brief course of vasopressor support. ID is on board.   Assessment & Plan:   Principal Problem:   Near syncope Active Problems:   Essential hypertension   Hyperlipidemia   Type 2 diabetes mellitus with stage 3 chronic kidney disease (HCC)   Chronic back pain   Obesity, Class III, BMI 40-49.9 (morbid obesity) (HCC)   Cellulitis of right lower extremity   Complete heart block (HCC)   Acute renal failure superimposed on stage 3 chronic kidney disease (HCC)   Hepatocellular carcinoma (HCC)   Severe sepsis Not present on admission. Secondary to enterococcus bacteremia. Unknown source but possibly hepatobiliary per chart review. He has had prolonged cellulitis which be a source. Associated hypotension that required a short course of vasopressors, IV fluids. Started on stress dose steroids -Wean steroids quickly; watch BP -Continue treatment for bacteremia  Enterococcus bacteremia ID on board. Transthoracic Echocardiogram significant for no valve or pacer lead vegetations. -Continue Ampicillin per ID recommendations -ID recommendations: Port removal and Transesophageal Echocardiogram -Cardiology consulted for Transesophageal Echocardiogram; NPO after midnight for possible TEE  Elevated LFTs Trending down. Likely  related to underlying liver cancer/hypotension. RUQ ultrasound significant for cholelithiasis with gallbladder wall thickening/edema. Negative murphy sign.  -Outpatient follow-up  Metastatic hepatocellular carcinoma T7 pathologic fracture Followed by oncology, Dr. [name]. Receiving radiation therapy. Port-A-Cath placement on 3/31. -Continue Tramadol, fentanyl patch, lidocaine patch, gabapentin -Radiation per radiation oncology  Acute metabolic vs toxic encephalopathy Secondary to medication vs sepsis vs combination per chart review. Resolved.  Acute on chronic diastolic heart failure Treated with IV lasix and has resolved. Patient takes lasix as an outpatient which has been held currently secondary to sepsis.  Diabetes mellitus, type 2 Patient is on metformin, U500 150 units at breakfast and 75 units at lunch. He reports hypoglycemia generally in the late afternoon/evening. Blood sugar trended down slightly this morning. No acidosis or elevated anion gap. -Continue carb modified diet -Continue Lantus but increase to 25 units twice per day, continue novolog 10 units TID with meals -Continue SSI, resistant scale  Bilateral lower extremity cellulitis Per chart review, resolved. Will need to reexamine right LE, however, LE appears resolved.  Dysphagia S/p sleep study on chart review. Nonspecific esophageal dysmotility. Carb/heart healthy diet  CAD Essential hypertension S/p CABG. On Coreg as an outpatient. Currently held secondary to recent hypotension  Hyperlipidemia -Continue pravastatin  Complete heart block Patient presented with near syncope. Cardiology consulted prior. Patient's pacemaker changed from VVI to DDD.   DVT prophylaxis: Xarelto Code Status:   Code Status: DNR Family Communication: None at bedside Disposition Plan: Home pending continued management/workup for bacteremia. Home with HHPT/OT   Consultants:   Infectious disease  Oncology  Cardiology   Procedures:   Pacemaker transition from VVI to DDD pacing on 12/03/2018  Esophagram 12/05/2018 nonspecific esophageal motility disorder  Antimicrobials:  P.o. cephalexin started 12/08/2018 to 12/12/2018  IV cefazolin 12/03/2020  to 12/08/2018  IV vancomycin and Zosyn 12/03/2018 to 12/04/2018  IV ampicillin started for 12/08/2018 ongoing    Subjective: No issues today.  Objective: Vitals:   12/23/18 1152 12/23/18 1355 12/23/18 2127 12/24/18 0536  BP:  134/68 136/80 119/73  Pulse: 87 85 96 68  Resp:  18 18 18   Temp:  97.6 F (36.4 C) 98.3 F (36.8 C) 98.2 F (36.8 C)  TempSrc:  Oral    SpO2: 95% 96% 95% 93%  Weight:      Height:        Intake/Output Summary (Last 24 hours) at 12/24/2018 1021 Last data filed at 12/24/2018 0539 Gross per 24 hour  Intake 603 ml  Output 2700 ml  Net -2097 ml   Filed Weights   12/08/18 0509 12/09/18 0600 12/21/18 1300  Weight: (!) 143.2 kg (!) 146.3 kg (!) 137.5 kg    Examination:  General exam: Appears calm and comfortable Respiratory system: Mild bibasilar rales, no wheezing. Respiratory effort normal. Cardiovascular system: S1 & S2 heard, RRR. No murmurs, rubs, gallops or clicks. Gastrointestinal system: Abdomen is nondistended, soft and nontender. No organomegaly or masses felt. Normal bowel sounds heard. Central nervous system: Alert and oriented. No focal neurological deficits. Extremities: No calf tenderness. No cellulitis. Right ulcer appears to be healing well Skin: No cyanosis. No rashes Psychiatry: Judgement and insight appear normal. Mood & affect appropriate.     Data Reviewed: I have personally reviewed following labs and imaging studies  CBC: Recent Labs  Lab 12/18/18 0523 12/19/18 0411 12/21/18 0418 12/22/18 0500 12/23/18 0432  WBC 6.9 6.7 7.5 7.8 5.5  NEUTROABS 5.5 5.6  --   --   --   HGB 11.5* 11.5* 12.8* 11.3* 10.9*  HCT 36.5* 37.0* 40.7 35.7* 34.5*  MCV 99.2 97.9 98.3 99.4 98.0  PLT 79* 70* 57* 55* 52*    Basic Metabolic Panel: Recent Labs  Lab 12/18/18 0523 12/19/18 0411 12/20/18 0440 12/21/18 0418 12/22/18 0500 12/23/18 0432 12/24/18 0354  NA 137  138 137 136 134* 136 138  --   K 4.2  4.2 4.3 4.4 5.0 4.8 4.0  --   CL 95*  95* 96* 96* 95* 102 102  --   CO2 31  32 31 31 28 24 26   --   GLUCOSE 188*  194* 227* 225* 183* 210* 304*  --   BUN 41*  42* 41* 45* 40* 44* 46*  --   CREATININE 0.95  0.98 0.93 0.96 0.92 1.01 0.95 0.83  CALCIUM 9.0  9.2 9.1 9.1 9.3 8.0* 8.2*  --   MG 2.3  --   --   --   --   --   --    GFR: Estimated Creatinine Clearance: 108.5 mL/min (by C-G formula based on SCr of 0.83 mg/dL). Liver Function Tests: Recent Labs  Lab 12/18/18 0523 12/21/18 0418 12/22/18 0500 12/23/18 0432  AST 131* 163* 115* 83*  ALT 171* 211* 160* 149*  ALKPHOS 277* 337* 238* 228*  BILITOT 1.5* 2.2* 2.4* 1.7*  PROT 6.3* 7.1 5.7* 6.0*  ALBUMIN 3.5 3.7 3.1* 3.1*   No results for input(s): LIPASE, AMYLASE in the last 168 hours. No results for input(s): AMMONIA in the last 168 hours. Coagulation Profile: Recent Labs  Lab 12/19/18 0411  INR 1.2   Cardiac Enzymes: No results for input(s): CKTOTAL, CKMB, CKMBINDEX, TROPONINI in the last 168 hours. BNP (last 3 results) No results for input(s): PROBNP in the last 8760 hours. HbA1C: No  results for input(s): HGBA1C in the last 72 hours. CBG: Recent Labs  Lab 12/23/18 0731 12/23/18 1150 12/23/18 1649 12/23/18 2300 12/24/18 0755  GLUCAP 315* 278* 296* 234* 211*   Lipid Profile: No results for input(s): CHOL, HDL, LDLCALC, TRIG, CHOLHDL, LDLDIRECT in the last 72 hours. Thyroid Function Tests: No results for input(s): TSH, T4TOTAL, FREET4, T3FREE, THYROIDAB in the last 72 hours. Anemia Panel: No results for input(s): VITAMINB12, FOLATE, FERRITIN, TIBC, IRON, RETICCTPCT in the last 72 hours. Sepsis Labs: Recent Labs  Lab 12/21/18 0750 12/22/18 0500 12/23/18 0432  PROCALCITON 0.20 0.26 0.14    Recent Results  (from the past 240 hour(s))  Culture, blood (routine x 2)     Status: Abnormal   Collection Time: 12/21/18  7:50 AM  Result Value Ref Range Status   Specimen Description   Final    BLOOD LEFT ARM Performed at [facility] [address] [place], [place] [address]    Special Requests   Final    BOTTLES DRAWN AEROBIC AND ANAEROBIC Blood Culture adequate volume Performed at [facility] [address], [place], [place] [address]    Culture  Setup Time   Final    GRAM POSITIVE COCCI IN BOTH AEROBIC AND ANAEROBIC BOTTLES CRITICAL VALUE NOTED.  VALUE IS CONSISTENT WITH PREVIOUSLY REPORTED AND CALLED VALUE.    Culture (A)  Final    ENTEROCOCCUS FAECALIS SUSCEPTIBILITIES PERFORMED ON PREVIOUS CULTURE WITHIN THE LAST 5 DAYS. Performed at [facility], [place] [address], [place], [place] [address]    Report Status 12/23/2018 FINAL  Final  Culture, blood (routine x 2)     Status: Abnormal   Collection Time: 12/21/18  7:51 AM  Result Value Ref Range Status   Specimen Description   Final    BLOOD RIGHT ARM Performed at [facility] [address], [place], [place] [address]    Special Requests   Final    BOTTLES DRAWN AEROBIC AND ANAEROBIC Blood Culture adequate volume Performed at [facility] [address] [place], [place] [address]    Culture  Setup Time   Final    IN BOTH AEROBIC AND ANAEROBIC BOTTLES GRAM POSITIVE COCCI CRITICAL RESULT CALLED TO, READ BACK BY AND VERIFIED WITH: B GREEN PHARMD 12/21/18 2337 JDW Performed at [facility], [place] [address] [place], [place] [address]    Culture ENTEROCOCCUS FAECALIS (A)  Final   Report Status 12/23/2018 FINAL  Final   Organism ID, Bacteria ENTEROCOCCUS FAECALIS  Final      Susceptibility   Enterococcus faecalis - MIC*    AMPICILLIN <=2 SENSITIVE Sensitive     VANCOMYCIN 1 SENSITIVE Sensitive     GENTAMICIN SYNERGY SENSITIVE Sensitive     *  ENTEROCOCCUS FAECALIS  Blood Culture ID Panel (Reflexed)     Status: Abnormal   Collection Time: 12/21/18  7:51 AM  Result Value Ref Range Status   Enterococcus species DETECTED (A) NOT DETECTED Final    Comment: CRITICAL RESULT CALLED TO, READ BACK BY AND VERIFIED WITH: B GREEN PHARMD 12/21/18 2337 JDW    Vancomycin resistance NOT DETECTED NOT DETECTED Final   Listeria monocytogenes NOT DETECTED NOT DETECTED Final   Staphylococcus species NOT DETECTED NOT DETECTED Final   Staphylococcus aureus (BCID) NOT DETECTED NOT DETECTED Final   Streptococcus species NOT DETECTED NOT DETECTED Final   Streptococcus agalactiae NOT DETECTED NOT DETECTED Final   Streptococcus pneumoniae NOT DETECTED NOT DETECTED  Final   Streptococcus pyogenes NOT DETECTED NOT DETECTED Final   Acinetobacter baumannii NOT DETECTED NOT DETECTED Final   Enterobacteriaceae species NOT DETECTED NOT DETECTED Final   Enterobacter cloacae complex NOT DETECTED NOT DETECTED Final   Escherichia coli NOT DETECTED NOT DETECTED Final   Klebsiella oxytoca NOT DETECTED NOT DETECTED Final   Klebsiella pneumoniae NOT DETECTED NOT DETECTED Final   Proteus species NOT DETECTED NOT DETECTED Final   Serratia marcescens NOT DETECTED NOT DETECTED Final   Haemophilus influenzae NOT DETECTED NOT DETECTED Final   Neisseria meningitidis NOT DETECTED NOT DETECTED Final   Pseudomonas aeruginosa NOT DETECTED NOT DETECTED Final   Candida albicans NOT DETECTED NOT DETECTED Final   Candida glabrata NOT DETECTED NOT DETECTED Final   Candida krusei NOT DETECTED NOT DETECTED Final   Candida parapsilosis NOT DETECTED NOT DETECTED Final   Candida tropicalis NOT DETECTED NOT DETECTED Final    Comment: Performed at [facility], [place] [address], [place], [place] [address]  Culture, Urine     Status: None   Collection Time: 12/21/18  9:59 AM  Result Value Ref Range Status   Specimen Description   Final    URINE, CLEAN CATCH Performed at [facility], [place] [address] [place], [place] [address]    Special Requests   Final    NONE Performed at [facility], [place] [address], [place], [place] [address]    Culture   Final    NO GROWTH Performed at [facility], [place] [address] [place], [place] [address]    Report Status 12/22/2018 FINAL  Final  MRSA PCR Screening     Status: None   Collection Time: 12/21/18  2:48 PM  Result Value Ref Range Status   MRSA by PCR NEGATIVE NEGATIVE Final    Comment:        The GeneXpert MRSA Assay (FDA approved for NASAL specimens only), is one component of a comprehensive MRSA colonization surveillance program. It is not intended to diagnose MRSA infection nor to guide or monitor treatment for MRSA infections. Performed at [facility], [place] [address] [place], [place] [address]   Culture, blood (routine x 2)     Status: None (Preliminary result)   Collection Time: 12/22/18  9:03 AM  Result Value Ref Range Status   Specimen Description   Final    BLOOD RIGHT HAND Performed at [facility] [address], [place], [place] [address]    Special Requests   Final    BOTTLES DRAWN AEROBIC AND ANAEROBIC Blood Culture adequate volume Performed at [facility] [address] [place], [place] [address]    Culture   Final    NO GROWTH 1 DAY Performed at [facility], [place] [address] [place], [place] [address]    Report Status PENDING  Incomplete  Culture, blood (routine x 2)     Status: None (Preliminary result)   Collection Time: 12/22/18  9:06 AM  Result Value Ref Range Status   Specimen Description   Final    BLOOD LEFT ARM Performed at [facility] [address], [place], [place] [address]    Special Requests   Final    BOTTLES DRAWN AEROBIC AND ANAEROBIC Blood Culture adequate volume Performed at [facility] [address] [place],   [address]    Culture   Final    NO GROWTH 1 DAY Performed  at [facility], [place] [address] [place], [place] [address]    Report Status PENDING  Incomplete         Radiology Studies: No results found.      Scheduled Meds: . calcitonin (salmon)  1 spray Alternating Nares Daily  . Chlorhexidine Gluconate Cloth  6 each Topical Daily  . cholecalciferol  2,000 Units Oral Q supper  . ezetimibe  10 mg Oral Daily  . feeding supplement (GLUCERNA SHAKE)  237 mL Oral TID BM  . feeding supplement (PRO-STAT SUGAR FREE 64)  30 mL Oral TID BM  . fenofibrate  160 mg Oral Daily  . fentaNYL  1 patch Transdermal Q72H  . gabapentin  400 mg Oral TID  . hydrocortisone sod succinate (SOLU-CORTEF) inj  50 mg Intravenous Q12H  . insulin aspart  0-20 Units Subcutaneous TID WC  . insulin aspart  0-5 Units Subcutaneous QHS  . insulin aspart  10 Units Subcutaneous TID WC  . insulin glargine  20 Units Subcutaneous BID  . lidocaine  1 patch Transdermal Q24H  . mouth rinse  15 mL Mouth Rinse BID  . multivitamin with minerals  1 tablet Oral Daily  . nystatin  5 mL Oral 6 X Daily  . omega-3 acid ethyl esters  2 g Oral BID  . pravastatin  80 mg Oral QHS  . rivaroxaban  20 mg Oral Q supper  . saccharomyces boulardii  250 mg Oral Daily  . senna-docusate  2 tablet Oral BID  . sodium bicarbonate/sodium chloride   Mouth Rinse QID  . sodium chloride flush  10-40 mL Intracatheter Q12H  . sodium chloride flush  3 mL Intravenous Q12H   Continuous Infusions: . ampicillin (OMNIPEN) IV 2 g (12/24/18 0939)     LOS: 21 days     Cordelia Poche, MD [facility] 12/24/2018, 10:21 AM  If 7PM-7AM, please contact night-coverage [email or web address]

## 2018-12-25 ENCOUNTER — Inpatient Hospital Stay (HOSPITAL_COMMUNITY): Payer: Medicare Other

## 2018-12-25 ENCOUNTER — Ambulatory Visit
Admit: 2018-12-25 | Discharge: 2018-12-25 | Disposition: A | Discharge status: Home / Self Care | Payer: Medicare Other | Attending: Radiation Oncology | Admitting: Radiation Oncology

## 2018-12-25 ENCOUNTER — Ambulatory Visit: Payer: Medicare Other

## 2018-12-25 DIAGNOSIS — K7581 Nonalcoholic steatohepatitis (NASH): Secondary | ICD-10-CM

## 2018-12-25 DIAGNOSIS — A4181 Sepsis due to Enterococcus: Secondary | ICD-10-CM

## 2018-12-25 DIAGNOSIS — R410 Disorientation, unspecified: Secondary | ICD-10-CM

## 2018-12-25 DIAGNOSIS — E1151 Type 2 diabetes mellitus with diabetic peripheral angiopathy without gangrene: Secondary | ICD-10-CM

## 2018-12-25 DIAGNOSIS — Z8679 Personal history of other diseases of the circulatory system: Secondary | ICD-10-CM

## 2018-12-25 HISTORY — PX: IR REMOVAL TUN ACCESS W/ PORT W/O FL MOD SED: IMG2290

## 2018-12-25 LAB — COMPREHENSIVE METABOLIC PANEL
ALT: 172 U/L — ABNORMAL HIGH (ref 0–44)
AST: 104 U/L — ABNORMAL HIGH (ref 15–41)
Albumin: 3.2 g/dL — ABNORMAL LOW (ref 3.5–5.0)
Alkaline Phosphatase: 267 U/L — ABNORMAL HIGH (ref 38–126)
Anion gap: 6 (ref 5–15)
BUN: 36 mg/dL — ABNORMAL HIGH (ref 8–23)
CO2: 29 mmol/L (ref 22–32)
Calcium: 8.6 mg/dL — ABNORMAL LOW (ref 8.9–10.3)
Chloride: 104 mmol/L (ref 98–111)
Creatinine, Ser: 0.71 mg/dL (ref 0.61–1.24)
GFR calc Af Amer: >60 mL/min (ref >60)
GFR calc non Af Amer: >60 mL/min (ref >60)
Glucose, Bld: 138 mg/dL — ABNORMAL HIGH (ref 70–99)
Potassium: 4.1 mmol/L (ref 3.5–5.1)
Sodium: 139 mmol/L (ref 135–145)
Total Bilirubin: 1.7 mg/dL — ABNORMAL HIGH (ref 0.3–1.2)
Total Protein: 6.2 g/dL — ABNORMAL LOW (ref 6.5–8.1)

## 2018-12-25 LAB — CBC
HCT: 36.9 % — ABNORMAL LOW (ref 39.0–52.0)
Hemoglobin: 11.6 g/dL — ABNORMAL LOW (ref 13.0–17.0)
MCH: 31 pg (ref 26.0–34.0)
MCHC: 31.4 g/dL (ref 30.0–36.0)
MCV: 98.7 fL (ref 80.0–100.0)
Platelets: 49 10*3/uL — ABNORMAL LOW (ref 150–400)
RBC: 3.74 MIL/uL — ABNORMAL LOW (ref 4.22–5.81)
RDW: 19.6 % — ABNORMAL HIGH (ref 11.5–15.5)
WBC: 5.1 10*3/uL (ref 4.0–10.5)
nRBC: 0 % (ref 0.0–0.2)

## 2018-12-25 LAB — PROTIME-INR
INR: 1.5 — ABNORMAL HIGH (ref 0.8–1.2)
Prothrombin Time: 17.8 seconds — ABNORMAL HIGH (ref 11.4–15.2)

## 2018-12-25 LAB — GLUCOSE, CAPILLARY
Glucose-Capillary: 73 mg/dL (ref 70–99)
Glucose-Capillary: 82 mg/dL (ref 70–99)
Glucose-Capillary: 235 mg/dL — ABNORMAL HIGH (ref 70–99)
Glucose-Capillary: 279 mg/dL — ABNORMAL HIGH (ref 70–99)
Glucose-Capillary: 158 mg/dL — ABNORMAL HIGH (ref 70–99)

## 2018-12-25 LAB — AMMONIA: Ammonia: 67 umol/L — ABNORMAL HIGH (ref 9–35)

## 2018-12-25 LAB — MAGNESIUM: Magnesium: 2.5 mg/dL — ABNORMAL HIGH (ref 1.7–2.4)

## 2018-12-25 MED ORDER — LIDOCAINE HCL 1 % IJ SOLN
INTRAMUSCULAR | Status: AC | PRN
  Administered 2018-12-25: 10 mL

## 2018-12-25 MED ORDER — CEFAZOLIN SODIUM-DEXTROSE 2-4 GM/100ML-% IV SOLN
INTRAVENOUS | Status: AC
  Administered 2018-12-25: 17:00:00 2 g via INTRAVENOUS
  Filled 2018-12-25: qty 100

## 2018-12-25 MED ORDER — DEXTROSE-NACL 5-0.45 % IV SOLN
INTRAVENOUS | Status: DC
  Administered 2018-12-25: 09:00:00 via INTRAVENOUS

## 2018-12-25 MED ORDER — SODIUM CHLORIDE 0.9 % IV SOLN
INTRAVENOUS | Status: AC | PRN
  Administered 2018-12-25: 10 mL/h via INTRAVENOUS

## 2018-12-25 MED ORDER — LIDOCAINE HCL 1 % IJ SOLN
INTRAMUSCULAR | Status: AC
  Filled 2018-12-25: qty 20

## 2018-12-25 MED ORDER — CEFAZOLIN SODIUM-DEXTROSE 2-4 GM/100ML-% IV SOLN
2.0000 g | Freq: Once | INTRAVENOUS | Status: AC
  Administered 2018-12-25: 17:00:00 2 g via INTRAVENOUS

## 2018-12-25 MED ORDER — INSULIN ASPART 100 UNIT/ML ~~LOC~~ SOLN
0.0000 [IU] | Freq: Three times a day (TID) | SUBCUTANEOUS | Status: DC
  Administered 2018-12-25: 18:00:00 5 [IU] via SUBCUTANEOUS
  Administered 2018-12-26 – 2018-12-27 (×4): 2 [IU] via SUBCUTANEOUS
  Administered 2018-12-28: 08:00:00 1 [IU] via SUBCUTANEOUS
  Administered 2018-12-28: 2 [IU] via SUBCUTANEOUS
  Administered 2018-12-28: 1 [IU] via SUBCUTANEOUS

## 2018-12-25 MED ORDER — FAMOTIDINE 20 MG PO TABS
20.0000 mg | ORAL_TABLET | Freq: Once | ORAL | Status: AC
  Administered 2018-12-25: 20 mg via ORAL
  Filled 2018-12-25: qty 1

## 2018-12-25 MED ORDER — LIDOCAINE HCL 1 % IJ SOLN
INTRAMUSCULAR | Status: AC | PRN
  Administered 2018-12-25: 10 mL via INTRADERMAL

## 2018-12-25 NOTE — Progress Notes (Signed)
Inpatient Diabetes Program Recommendations  AACE/ADA: New Consensus Statement on Inpatient Glycemic Control (2015)  Target Ranges:  Prepandial:   less than 140 mg/dL      Peak postprandial:   less than 180 mg/dL (1-2 hours)      Critically ill patients:  140 - 180 mg/dL   Lab Results  Component Value Date   GLUCAP 82 12/25/2018   HGBA1C 7.0 (A) 08/24/2018    Review of Glycemic Control Results for KYLON, PHILBROOK" (MRN [id number]) as of 12/25/2018 11:11  Ref. Range 12/24/2018 12:03 12/24/2018 16:45 12/24/2018 20:52 12/25/2018 08:05 12/25/2018 10:18  Glucose-Capillary Latest Ref Range: 70 - 99 mg/dL 263 (H) 185 (H) 209 (H) 73 82    Inpatient Diabetes Program Recommendations:   With decrease in steroids, consider: -Decrease Lantus insulin 20 units bid -Decrease Novolog correction to sensitive scale  Thank you, Bethena Roys E. Mearl Harewood, RN, MSN, CDE  Diabetes Coordinator Inpatient Glycemic Control Team Team Pager [phone] (8am-5pm) 12/25/2018 11:13 AM

## 2018-12-25 NOTE — Progress Notes (Signed)
Pts. Home CPAP set up/humidifier filled with S/W, remains on room air.

## 2018-12-25 NOTE — Progress Notes (Signed)
Physical Therapy Treatment Patient Details Name: LEAH SKORA MRN: [id number] DOB: 1941/01/05 Today's Date: 12/25/2018    History of Present Illness Pt is a 78 y/o male admitted 12/03/18 with dizziness, bradycardia and near syncope. Pt with complete heart block and metastatic hepatocellular carinoma with T7 compression fx. Not a candidate for verterbroplasty, to begin palliative radiation at [facility] 3/20.   4/2 in the am developed fever and hypotension-->Transferred to ICU, back to floor 4/3. PMH includes DM, MI, ICD, HTN, HLD, A-fib.    PT Comments    As therapist entered room, nursing was in room helping pt to bathroom after pt had an incontinence episode. Assisted nursing with clean up of room and pt. Then assisted pt back to bed. He was very fatigued after all the previously mentioned activity. Will continue to follow.     Follow Up Recommendations  Home health PT;Supervision for mobility/OOB     Equipment Recommendations  None recommended by PT    Recommendations for Other Services       Precautions / Restrictions Precautions Precautions: Back;Fall Precaution Booklet Issued: No Precaution Comments: pt does not wish to use brace Required Braces or Orthoses: Spinal Brace Spinal Brace: Thoracolumbosacral orthotic Restrictions Weight Bearing Restrictions: No    Mobility  Bed Mobility Overal bed mobility: Needs Assistance Bed Mobility: Rolling;Sidelying to Sit;Sit to Sidelying Rolling: Min guard;Min assist Sidelying to sit: Min assist;Min guard     Sit to sidelying: Min assist General bed mobility comments: incr time, assist to bring LEs into bed  Transfers Overall transfer level: Needs assistance Equipment used: Rolling walker (2 wheeled) Transfers: Sit to/from Stand Sit to Stand: Min guard Stand pivot transfers: Min assist       General transfer comment: cues for safe technique  Ambulation/Gait Ambulation/Gait assistance: Min guard Gait Distance (Feet):  15 Feet Assistive device: Rolling walker (2 wheeled) Gait Pattern/deviations: Step-through pattern;Decreased stride length     General Gait Details: close guard for safety. Pt walked from bathroom back to bed. Deferred further ambulation 2* pt having a very large bowel incontinence episode    Stairs             Wheelchair Mobility    Modified Rankin (Stroke Patients Only)       Balance Overall balance assessment: Needs assistance Sitting-balance support: Feet supported;No upper extremity supported Sitting balance-Leahy Scale: Good     Standing balance support: Bilateral upper extremity supported Standing balance-Leahy Scale: Poor                              Cognition Arousal/Alertness: Awake/alert Behavior During Therapy: WFL for tasks assessed/performed Overall Cognitive Status: Within Functional Limits for tasks assessed                         Following Commands: Follows one step commands with increased time              Exercises      General Comments        Pertinent Vitals/Pain Pain Assessment: Faces Faces Pain Scale: Hurts little more Pain Location: back  Pain Descriptors / Indicators: Discomfort;Sore Pain Intervention(s): Limited activity within patient's tolerance;Repositioned    Home Living                      Prior Function            PT Goals (current goals  can now be found in the care plan section) Acute Rehab PT Goals Patient Stated Goal: home, hopefully Monday Progress towards PT goals: Progressing toward goals    Frequency    Min 3X/week      PT Plan Current plan remains appropriate    Co-evaluation              AM-PAC PT "6 Clicks" Mobility   Outcome Measure  Help needed turning from your back to your side while in a flat bed without using bedrails?: A Little Help needed moving from lying on your back to sitting on the side of a flat bed without using bedrails?: A  Little Help needed moving to and from a bed to a chair (including a wheelchair)?: A Little Help needed standing up from a chair using your arms (e.g., wheelchair or bedside chair)?: A Little Help needed to walk in hospital room?: A Little Help needed climbing 3-5 steps with a railing? : A Lot 6 Click Score: 17    End of Session   Activity Tolerance: Patient limited by fatigue Patient left: in bed;with call bell/phone within reach;with bed alarm set   PT Visit Diagnosis: Muscle weakness (generalized) (M62.81);Unsteadiness on feet (R26.81) Pain - part of body: (back)     Time: 3267-1245 PT Time Calculation (min) (ACUTE ONLY): 32 min  Charges:  $Gait Training: 8-22 mins $Therapeutic Activity: 8-22 mins                        Weston Anna, PT Acute Rehabilitation Services Pager: [phone] Office: [phone]

## 2018-12-25 NOTE — Progress Notes (Signed)
Patient ID: Robert Gregory, male   DOB: [birth year], 78 y.o.   MRN: [id number] Per discussion with [name] this morning he does not want patient's Port-A-Cath to be removed.  Order canceled.Nurse notified.

## 2018-12-25 NOTE — Progress Notes (Signed)
[facility] for Infectious Disease   Reason for visit: Follow up on Enterococcal sepsis  Interval History: pt underwent explantation of her RT chest portocath today. He currently is quite temporally confused but unclear if this is due to recent sedation for procedure or primary disease process. Pt states appetite improved prior to being "starved all day" to get portocath removed.  TTE report independently reviewed.   Physical Exam: Vitals:   12/25/18 0602 12/25/18 1342  BP: (!) 117/54 129/73  Pulse: 63 96  Resp: 18 18  Temp: 98.1 F (36.7 C) 98.7 F (37.1 C)  SpO2: 96% 95%  Constitutional: patient appears in NAD but is quite temporally confused at times, speech fluent but frequently replaces words with incorrect phrases Eyes: anicteric, PERRL, EOMI HENT: no thrush, MMM, fair dentition Respiratory: Normal respiratory effort; CTA B Cardiovascular: RRR, no obvious murmurs GI: soft, nt, obese, diffusely distended, +BS Extrems: RT distal leg wrapped with slight serous drainage but no erythema, 1+ non-pitting LE edema with venous stasis dermatitis noted  Review of Systems: All systems were reviewed and negative except as noted above.  Lab Results  Component Value Date   WBC 5.1 12/25/2018   HGB 11.6 (L) 12/25/2018   HCT 36.9 (L) 12/25/2018   MCV 98.7 12/25/2018   PLT 49 (L) 12/25/2018    Lab Results  Component Value Date   CREATININE 0.71 12/25/2018   BUN 36 (H) 12/25/2018   NA 139 12/25/2018   K 4.1 12/25/2018   CL 104 12/25/2018   CO2 29 12/25/2018    Lab Results  Component Value Date   ALT 172 (H) 12/25/2018   AST 104 (H) 12/25/2018   ALKPHOS 267 (H) 12/25/2018     Microbiology: Recent Results (from the past 240 hour(s))  Culture, blood (routine x 2)     Status: Abnormal   Collection Time: 12/21/18  7:50 AM  Result Value Ref Range Status   Specimen Description   Final    BLOOD LEFT ARM Performed at [facility], [place] [address],  [place], [place] [address]    Special Requests   Final    BOTTLES DRAWN AEROBIC AND ANAEROBIC Blood Culture adequate volume Performed at [facility] [address] [place], [place] [address]    Culture  Setup Time   Final    GRAM POSITIVE COCCI IN BOTH AEROBIC AND ANAEROBIC BOTTLES CRITICAL VALUE NOTED.  VALUE IS CONSISTENT WITH PREVIOUSLY REPORTED AND CALLED VALUE.    Culture (A)  Final    ENTEROCOCCUS FAECALIS SUSCEPTIBILITIES PERFORMED ON PREVIOUS CULTURE WITHIN THE LAST 5 DAYS. Performed at [facility], [place] [address], [place], [place] [address]    Report Status 12/23/2018 FINAL  Final  Culture, blood (routine x 2)     Status: Abnormal   Collection Time: 12/21/18  7:51 AM  Result Value Ref Range Status   Specimen Description   Final    BLOOD RIGHT ARM Performed at [facility] [address] [place], [place] [address]    Special Requests   Final    BOTTLES DRAWN AEROBIC AND ANAEROBIC Blood Culture adequate volume Performed at [facility] [address], [place], [place] [address]    Culture  Setup Time   Final    IN BOTH AEROBIC AND ANAEROBIC BOTTLES GRAM POSITIVE COCCI CRITICAL RESULT CALLED TO, READ BACK BY AND VERIFIED WITH: B GREEN PHARMD 12/21/18 2337 JDW Performed at [facility],  [address], [place], [place] [address]    Culture ENTEROCOCCUS FAECALIS (A)  Final   Report Status 12/23/2018 FINAL  Final   Organism ID, Bacteria ENTEROCOCCUS FAECALIS  Final      Susceptibility   Enterococcus faecalis - MIC*    AMPICILLIN <=2 SENSITIVE Sensitive     VANCOMYCIN 1 SENSITIVE Sensitive     GENTAMICIN SYNERGY SENSITIVE Sensitive     * ENTEROCOCCUS FAECALIS  Blood Culture ID Panel (Reflexed)     Status: Abnormal   Collection Time: 12/21/18  7:51 AM  Result Value Ref Range Status   Enterococcus species DETECTED (A) NOT DETECTED Final    Comment: CRITICAL RESULT CALLED TO, READ BACK BY AND VERIFIED  WITH: B GREEN PHARMD 12/21/18 2337 JDW    Vancomycin resistance NOT DETECTED NOT DETECTED Final   Listeria monocytogenes NOT DETECTED NOT DETECTED Final   Staphylococcus species NOT DETECTED NOT DETECTED Final   Staphylococcus aureus (BCID) NOT DETECTED NOT DETECTED Final   Streptococcus species NOT DETECTED NOT DETECTED Final   Streptococcus agalactiae NOT DETECTED NOT DETECTED Final   Streptococcus pneumoniae NOT DETECTED NOT DETECTED Final   Streptococcus pyogenes NOT DETECTED NOT DETECTED Final   Acinetobacter baumannii NOT DETECTED NOT DETECTED Final   Enterobacteriaceae species NOT DETECTED NOT DETECTED Final   Enterobacter cloacae complex NOT DETECTED NOT DETECTED Final   Escherichia coli NOT DETECTED NOT DETECTED Final   Klebsiella oxytoca NOT DETECTED NOT DETECTED Final   Klebsiella pneumoniae NOT DETECTED NOT DETECTED Final   Proteus species NOT DETECTED NOT DETECTED Final   Serratia marcescens NOT DETECTED NOT DETECTED Final   Haemophilus influenzae NOT DETECTED NOT DETECTED Final   Neisseria meningitidis NOT DETECTED NOT DETECTED Final   Pseudomonas aeruginosa NOT DETECTED NOT DETECTED Final   Candida albicans NOT DETECTED NOT DETECTED Final   Candida glabrata NOT DETECTED NOT DETECTED Final   Candida krusei NOT DETECTED NOT DETECTED Final   Candida parapsilosis NOT DETECTED NOT DETECTED Final   Candida tropicalis NOT DETECTED NOT DETECTED Final    Comment: Performed at [facility], [place] [address], [place], [place] [address]  Culture, Urine     Status: None   Collection Time: 12/21/18  9:59 AM  Result Value Ref Range Status   Specimen Description   Final    URINE, CLEAN CATCH Performed at [facility], [place] [address] [place], [place] [address]    Special Requests   Final    NONE Performed at [facility], [place] [address], [place], [place] [address]    Culture   Final    NO GROWTH Performed at [facility], [place] [address] [place], [place] [address]    Report Status 12/22/2018 FINAL  Final  MRSA PCR Screening     Status: None   Collection Time: 12/21/18  2:48 PM  Result Value Ref Range Status   MRSA by PCR NEGATIVE NEGATIVE Final    Comment:        The GeneXpert MRSA Assay (FDA approved for NASAL specimens only), is one component of a comprehensive MRSA colonization surveillance program. It is not intended to diagnose MRSA infection nor to guide or monitor treatment for MRSA infections. Performed at [facility], [place] [address], [place],  [address]   Culture, blood (routine x 2)     Status: None (Preliminary result)   Collection Time: 12/22/18  9:03 AM  Result Value Ref Range Status  Specimen Description   Final    BLOOD RIGHT HAND Performed at [facility] [address] [place], [place] [address]    Special Requests   Final    BOTTLES DRAWN AEROBIC AND ANAEROBIC Blood Culture adequate volume Performed at [facility] [address], [place], [place] [address]    Culture   Final    NO GROWTH 3 DAYS Performed at [facility], [place] [address] [place], [place] [address]    Report Status PENDING  Incomplete  Culture, blood (routine x 2)     Status: None (Preliminary result)   Collection Time: 12/22/18  9:06 AM  Result Value Ref Range Status   Specimen Description   Final    BLOOD LEFT ARM Performed at [facility] [address], [place], [place] [address]    Special Requests   Final    BOTTLES DRAWN AEROBIC AND ANAEROBIC Blood Culture adequate volume Performed at [facility] [address], [place], [place] [address]    Culture   Final    NO GROWTH 3 DAYS Performed at [facility], [place] [address], [place], [place] [address]    Report Status PENDING  Incomplete    Impression/Plan: The patient is a 78 y/o obese WM with DM and CRI, h/o heart block, s/p PM, NASH  and recently diagnosed metastatic HCC, s/p portocath placement admitted with fever, thrombocytopenia, and Enterococcal sepsis.  1. Enterococcal sepsis - Both initial blood cxs were positive for pan-sensitive Enterococcus faecalis. Urine cx did not suggest this was source of his infection. Given the advanced stage of his HCC, worry for endogenous GI translocation would be most likely cause, which played a role in rationale to remove portocath (along with known data with pathogen adhesion to foreign devices) today. Repeat blood cxs are showing signs of clearance and TTE was unrevealing. Only viable PO tx option would be zyvox but this is contra-indicated by his concurrent thrombocytopenia. If repeat blood cxs remain negative tomorrow, may place PICC and plan to complete remainder 3 weeks of IV ampicillin 2 gm q 6 hrs as an OP given current renal function. Follow MS closely as his risk for hepatic encephalopathy and septic emboli are both quite high. Hopefully, today's confusion was only due to sedation from port explantation.  2. Fever - now resolved with targeted ABX tx. Repeat blood cxs x 2 for fever > 100.5.  3. [place] - Pt is s/p palliative XRT. Oncologist is eager to initiate immunotherapy soon for tx. Provided this will not result in neutropenia, it may be allowable for pt to begin chemo in next 10-14 days. Recommend close OP f/u with oncologist.

## 2018-12-25 NOTE — Progress Notes (Addendum)
PROGRESS NOTE    Robert Gregory  UXN:[id number] DOB: 1940/11/15 DOA: 12/03/2018 PCP: Seward Carol, MD   Brief Narrative: Robert Gregory is a 78 y.o. male with a history of coronary artery disease status post CABG, status post pacemaker placement, atrial fibrillation on rivaroxaban, stage III CKD, obstructive sleep apnea, type 2 diabetes, [name] cirrhosis, recent diagnosis of metastatic hepatocellular carcinoma with disease to thoracic spine, hypertension. He was admitted secondary to near syncope which was secondary to complete heart block. Pacemaker adjusted but hospital course complicated by LE cellulitis and enterococcus bacteremia, in addition to severe sepsis requiring a brief course of vasopressor support. ID is on board.   Assessment & Plan:   Principal Problem:   Near syncope Active Problems:   Essential hypertension   Hyperlipidemia   Type 2 diabetes mellitus with stage 3 chronic kidney disease (HCC)   Chronic back pain   Obesity, Class III, BMI 40-49.9 (morbid obesity) (HCC)   Cellulitis of right lower extremity   Complete heart block (HCC)   Acute renal failure superimposed on stage 3 chronic kidney disease (HCC)   Hepatocellular carcinoma (HCC)   Severe sepsis Septic shock Not present on admission. Secondary to enterococcus bacteremia. Unknown source but possibly hepatobiliary per chart review. He has had prolonged cellulitis which be a source. Associated hypotension that required a short course of vasopressors, IV fluids. Started on stress dose steroids -Wean steroids quickly; watch BP -Continue treatment for bacteremia  Enterococcus bacteremia ID on board. Transthoracic Echocardiogram significant for no valve or pacer lead vegetations. Discussed directly with ID and recommendations are for port removal secondary to pathogen type in addition to concern for potential seeding to pacer device if port stays in. Discussed with oncology as well -Continue Ampicillin per ID  recommendations -ID recommendations: Port removal and Transesophageal Echocardiogram -Transesophageal Echocardiogram postponed until 4/7 secondary to scheduling  Elevated LFTs Trending back up. RUQ ultrasound significant for cholelithiasis with gallbladder wall thickening/edema. Negative murphy sign. No abdominal tenderness on exam.  -Will watch carefully in setting of possible cholelithiasis  Metastatic hepatocellular carcinoma T7 pathologic fracture Followed by oncology, [name]. Receiving radiation therapy. Port-A-Cath placement on 3/31. -Continue Tramadol, fentanyl patch, lidocaine patch, gabapentin -Radiation per radiation oncology  Acute metabolic vs toxic encephalopathy Secondary to medication vs sepsis vs combination per chart review. Resolved.  Acute on chronic diastolic heart failure Treated with IV lasix and has resolved. Patient takes lasix as an outpatient which has been held currently secondary to sepsis.  Diabetes mellitus, type 2 Patient is on metformin, U500 150 units at breakfast and 75 units at lunch. He reports hypoglycemia generally in the late afternoon/evening. Blood sugar trended down slightly this morning. No acidosis or elevated anion gap. -Continue carb modified diet -Continue Lantus but increase to 25 units twice per day, continue novolog 10 units TID with meals. Holding daytime insulin dose secondary to NPO status and low blood sugar this morning -Continue SSI, resistant scale -Check CBG q2 hours, give D5 fluids at low rate while NPO  Bilateral lower extremity cellulitis Per chart review, resolved. Resolved.  Dysphagia S/p sleep study on chart review. Nonspecific esophageal dysmotility. Carb/heart healthy diet  CAD Essential hypertension S/p CABG. On Coreg as an outpatient. Currently held secondary to recent hypotension  Hyperlipidemia -Continue pravastatin  Complete heart block Patient presented with near syncope. Cardiology consulted prior.  Patient's pacemaker changed from VVI to DDD.  Confusion Likely hepatic encephalopathy. Mild. -Continue lactulose, will titrate for BM response   DVT prophylaxis:  Xarelto Code Status:   Code Status: DNR Family Communication: None at bedside Disposition Plan: Home pending continued management/workup for bacteremia. Home with HHPT/OT   Consultants:   Infectious disease  Oncology  Cardiology  Procedures:   Pacemaker transition from VVI to DDD pacing on 12/03/2018  Esophagram 12/05/2018 nonspecific esophageal motility disorder  Antimicrobials:  P.o. cephalexin started 12/08/2018 to 12/12/2018  IV cefazolin 12/03/2020 to 12/08/2018  IV vancomycin and Zosyn 12/03/2018 to 12/04/2018  IV ampicillin started for 12/08/2018 ongoing    Subjective: Some confusion yesterday. Hoping to go home.  Objective: Vitals:   12/24/18 1544 12/24/18 1956 12/24/18 2050 12/25/18 0602  BP: 132/88 139/76 124/73 (!) 117/54  Pulse: 80 84 79 63  Resp: 17 16 18 18   Temp: 97.7 F (36.5 C) 97.7 F (36.5 C) 98.5 F (36.9 C) 98.1 F (36.7 C)  TempSrc: Oral Oral Oral   SpO2: 97% 97% 95% 96%  Weight:      Height:        Intake/Output Summary (Last 24 hours) at 12/25/2018 0949 Last data filed at 12/25/2018 0600 Gross per 24 hour  Intake 540 ml  Output 1300 ml  Net -760 ml   Filed Weights   12/08/18 0509 12/09/18 0600 12/21/18 1300  Weight: (!) 143.2 kg (!) 146.3 kg (!) 137.5 kg    Examination:  General exam: Appears calm and comfortable Respiratory system: Clear to auscultation. Respiratory effort normal. Cardiovascular system: S1 & S2 heard, RRR. No murmurs, rubs, gallops or clicks. Gastrointestinal system: Abdomen is distended, soft and nontender. Normal bowel sounds heard. Central nervous system: Alert and oriented. No focal neurological deficits. Extremities: No edema. No calf tenderness Skin: No cyanosis. No rashes Psychiatry: Judgement and insight appear normal. Mood & affect  appropriate.     Data Reviewed: I have personally reviewed following labs and imaging studies  CBC: Recent Labs  Lab 12/19/18 0411 12/21/18 0418 12/22/18 0500 12/23/18 0432 12/25/18 0335  WBC 6.7 7.5 7.8 5.5 5.1  NEUTROABS 5.6  --   --   --   --   HGB 11.5* 12.8* 11.3* 10.9* 11.6*  HCT 37.0* 40.7 35.7* 34.5* 36.9*  MCV 97.9 98.3 99.4 98.0 98.7  PLT 70* 57* 55* 52* 49*   Basic Metabolic Panel: Recent Labs  Lab 12/20/18 0440 12/21/18 0418 12/22/18 0500 12/23/18 0432 12/24/18 0354 12/24/18 1728 12/25/18 0335  NA 136 134* 136 138  --   --  139  K 4.4 5.0 4.8 4.0  --   --  4.1  CL 96* 95* 102 102  --   --  104  CO2 31 28 24 26   --   --  29  GLUCOSE 225* 183* 210* 304*  --   --  138*  BUN 45* 40* 44* 46*  --   --  36*  CREATININE 0.96 0.92 1.01 0.95 0.83  --  0.71  CALCIUM 9.1 9.3 8.0* 8.2*  --   --  8.6*  MG  --   --   --   --   --  2.6* 2.5*   GFR: Estimated Creatinine Clearance: 112.5 mL/min (by C-G formula based on SCr of 0.71 mg/dL). Liver Function Tests: Recent Labs  Lab 12/21/18 0418 12/22/18 0500 12/23/18 0432 12/25/18 0335  AST 163* 115* 83* 104*  ALT 211* 160* 149* 172*  ALKPHOS 337* 238* 228* 267*  BILITOT 2.2* 2.4* 1.7* 1.7*  PROT 7.1 5.7* 6.0* 6.2*  ALBUMIN 3.7 3.1* 3.1* 3.2*   No results for  input(s): LIPASE, AMYLASE in the last 168 hours. Recent Labs  Lab 12/24/18 1728 12/25/18 0335  AMMONIA 53* 67*   Coagulation Profile: Recent Labs  Lab 12/19/18 0411  INR 1.2   Cardiac Enzymes: No results for input(s): CKTOTAL, CKMB, CKMBINDEX, TROPONINI in the last 168 hours. BNP (last 3 results) No results for input(s): PROBNP in the last 8760 hours. HbA1C: No results for input(s): HGBA1C in the last 72 hours. CBG: Recent Labs  Lab 12/24/18 0755 12/24/18 1203 12/24/18 1645 12/24/18 2052 12/25/18 0805  GLUCAP 211* 263* 185* 209* 73   Lipid Profile: No results for input(s): CHOL, HDL, LDLCALC, TRIG, CHOLHDL, LDLDIRECT in the last 72  hours. Thyroid Function Tests: No results for input(s): TSH, T4TOTAL, FREET4, T3FREE, THYROIDAB in the last 72 hours. Anemia Panel: No results for input(s): VITAMINB12, FOLATE, FERRITIN, TIBC, IRON, RETICCTPCT in the last 72 hours. Sepsis Labs: Recent Labs  Lab 12/21/18 0750 12/22/18 0500 12/23/18 0432  PROCALCITON 0.20 0.26 0.14    Recent Results (from the past 240 hour(s))  Culture, blood (routine x 2)     Status: Abnormal   Collection Time: 12/21/18  7:50 AM  Result Value Ref Range Status   Specimen Description   Final    BLOOD LEFT ARM Performed at [facility] [address] [place], [place] [address]    Special Requests   Final    BOTTLES DRAWN AEROBIC AND ANAEROBIC Blood Culture adequate volume Performed at [facility] [address] [place], [place] [address]    Culture  Setup Time   Final    GRAM POSITIVE COCCI IN BOTH AEROBIC AND ANAEROBIC BOTTLES CRITICAL VALUE NOTED.  VALUE IS CONSISTENT WITH PREVIOUSLY REPORTED AND CALLED VALUE.    Culture (A)  Final    ENTEROCOCCUS FAECALIS SUSCEPTIBILITIES PERFORMED ON PREVIOUS CULTURE WITHIN THE LAST 5 DAYS. Performed at [facility], [place] [address], [place], [place] [address]    Report Status 12/23/2018 FINAL  Final  Culture, blood (routine x 2)     Status: Abnormal   Collection Time: 12/21/18  7:51 AM  Result Value Ref Range Status   Specimen Description   Final    BLOOD RIGHT ARM Performed at [facility] [address] [place], [place] [address]    Special Requests   Final    BOTTLES DRAWN AEROBIC AND ANAEROBIC Blood Culture adequate volume Performed at [facility] [address] [place], [place] [address]    Culture  Setup Time   Final    IN BOTH AEROBIC AND ANAEROBIC BOTTLES GRAM POSITIVE COCCI CRITICAL RESULT CALLED TO, READ BACK BY AND VERIFIED WITH: B GREEN PHARMD 12/21/18 2337 JDW Performed at [facility],  [place] [address], [place], [place] [address]    Culture ENTEROCOCCUS FAECALIS (A)  Final   Report Status 12/23/2018 FINAL  Final   Organism ID, Bacteria ENTEROCOCCUS FAECALIS  Final      Susceptibility   Enterococcus faecalis - MIC*    AMPICILLIN <=2 SENSITIVE Sensitive     VANCOMYCIN 1 SENSITIVE Sensitive     GENTAMICIN SYNERGY SENSITIVE Sensitive     * ENTEROCOCCUS FAECALIS  Blood Culture ID Panel (Reflexed)     Status: Abnormal   Collection Time: 12/21/18  7:51 AM  Result Value Ref Range Status   Enterococcus species DETECTED (A) NOT DETECTED Final    Comment: CRITICAL RESULT CALLED TO, READ BACK BY AND VERIFIED WITH: B GREEN PHARMD 12/21/18  2337 JDW    Vancomycin resistance NOT DETECTED NOT DETECTED Final   Listeria monocytogenes NOT DETECTED NOT DETECTED Final   Staphylococcus species NOT DETECTED NOT DETECTED Final   Staphylococcus aureus (BCID) NOT DETECTED NOT DETECTED Final   Streptococcus species NOT DETECTED NOT DETECTED Final   Streptococcus agalactiae NOT DETECTED NOT DETECTED Final   Streptococcus pneumoniae NOT DETECTED NOT DETECTED Final   Streptococcus pyogenes NOT DETECTED NOT DETECTED Final   Acinetobacter baumannii NOT DETECTED NOT DETECTED Final   Enterobacteriaceae species NOT DETECTED NOT DETECTED Final   Enterobacter cloacae complex NOT DETECTED NOT DETECTED Final   Escherichia coli NOT DETECTED NOT DETECTED Final   Klebsiella oxytoca NOT DETECTED NOT DETECTED Final   Klebsiella pneumoniae NOT DETECTED NOT DETECTED Final   Proteus species NOT DETECTED NOT DETECTED Final   Serratia marcescens NOT DETECTED NOT DETECTED Final   Haemophilus influenzae NOT DETECTED NOT DETECTED Final   Neisseria meningitidis NOT DETECTED NOT DETECTED Final   Pseudomonas aeruginosa NOT DETECTED NOT DETECTED Final   Candida albicans NOT DETECTED NOT DETECTED Final   Candida glabrata NOT DETECTED NOT DETECTED Final   Candida krusei NOT DETECTED NOT DETECTED Final   Candida parapsilosis  NOT DETECTED NOT DETECTED Final   Candida tropicalis NOT DETECTED NOT DETECTED Final    Comment: Performed at [facility], [address] [place], [place] [address]  Culture, Urine     Status: None   Collection Time: 12/21/18  9:59 AM  Result Value Ref Range Status   Specimen Description   Final    URINE, CLEAN CATCH Performed at [facility], [place] [address] [place], [place] [address]    Special Requests   Final    NONE Performed at [facility], [place] [address] [place], [place] [address]    Culture   Final    NO GROWTH Performed at [facility], [place] [address], [place], [place] [address]    Report Status 12/22/2018 FINAL  Final  MRSA PCR Screening     Status: None   Collection Time: 12/21/18  2:48 PM  Result Value Ref Range Status   MRSA by PCR NEGATIVE NEGATIVE Final    Comment:        The GeneXpert MRSA Assay (FDA approved for NASAL specimens only), is one component of a comprehensive MRSA colonization surveillance program. It is not intended to diagnose MRSA infection nor to guide or monitor treatment for MRSA infections. Performed at [facility], [place] [address], [place], [place] [address]   Culture, blood (routine x 2)     Status: None (Preliminary result)   Collection Time: 12/22/18  9:03 AM  Result Value Ref Range Status   Specimen Description   Final    BLOOD RIGHT HAND Performed at [facility] [address] [place], [place] [address]    Special Requests   Final    BOTTLES DRAWN AEROBIC AND ANAEROBIC Blood Culture adequate volume Performed at [facility] [address] [place], [place] [address]    Culture   Final    NO GROWTH 3 DAYS Performed at [facility], [place] [address], [place],  [address]    Report Status PENDING  Incomplete  Culture, blood (routine x 2)     Status: None (Preliminary result)   Collection Time: 12/22/18   9:06 AM  Result Value Ref Range Status   Specimen Description   Final  BLOOD LEFT ARM Performed at [facility], [place] [address], [place], [place] [address]    Special Requests   Final    BOTTLES DRAWN AEROBIC AND ANAEROBIC Blood Culture adequate volume Performed at [facility] [address] [place], [place] [address]    Culture   Final    NO GROWTH 3 DAYS Performed at [facility], [place] [address], [place], [place] [address]    Report Status PENDING  Incomplete         Radiology Studies: No results found.      Scheduled Meds: . calcitonin (salmon)  1 spray Alternating Nares Daily  . Chlorhexidine Gluconate Cloth  6 each Topical Daily  . cholecalciferol  2,000 Units Oral Q supper  . ezetimibe  10 mg Oral Daily  . feeding supplement (GLUCERNA SHAKE)  237 mL Oral TID BM  . feeding supplement (PRO-STAT SUGAR FREE 64)  30 mL Oral TID BM  . fenofibrate  160 mg Oral Daily  . fentaNYL  1 patch Transdermal Q72H  . gabapentin  400 mg Oral TID  . hydrocortisone sod succinate (SOLU-CORTEF) inj  50 mg Intravenous Daily  . insulin aspart  0-20 Units Subcutaneous TID WC  . insulin aspart  0-5 Units Subcutaneous QHS  . insulin aspart  10 Units Subcutaneous TID WC  . insulin glargine  25 Units Subcutaneous BID  . lactulose  10 g Oral BID  . lidocaine  1 patch Transdermal Q24H  . mouth rinse  15 mL Mouth Rinse BID  . multivitamin with minerals  1 tablet Oral Daily  . nystatin  5 mL Oral 6 X Daily  . omega-3 acid ethyl esters  2 g Oral BID  . pravastatin  80 mg Oral QHS  . rivaroxaban  20 mg Oral Q supper  . saccharomyces boulardii  250 mg Oral Daily  . senna-docusate  2 tablet Oral BID  . sodium bicarbonate/sodium chloride   Mouth Rinse QID  . sodium chloride flush  10-40 mL Intracatheter Q12H  . sodium chloride flush  3 mL Intravenous Q12H   Continuous Infusions: . ampicillin (OMNIPEN) IV 2 g (12/25/18 0903)  . dextrose 5 %  and 0.45% NaCl 50 mL/hr at 12/25/18 0902     LOS: 22 days     Cordelia Poche, MD [facility] 12/25/2018, 9:49 AM  If 7PM-7AM, please contact night-coverage [email or web address]

## 2018-12-25 NOTE — Progress Notes (Signed)
Spoke with bedside RN, Elisabeth Cara, to set up carelink transport for TEE tomorrow at [facility].  Patient needs to arrive at 0830 for a 0930 procedure.  Vista Lawman, RN

## 2018-12-25 NOTE — Progress Notes (Signed)
     Discussed with scheduling at endoscopy.  Unable to do TEE secondary to anesthesia and room scheduling.  The patient is scheduled for tomorrow.    Also, I reviewed the telemetry.  There is no VT.  Rhythm described as VT are paced beats.  Patient may eat and should be NPO after MN.

## 2018-12-25 NOTE — Progress Notes (Signed)
[facility] Therapy Treatment Record Phone [phone]   Radiation Therapy was administered to Robert Gregory on: 12/25/2018  12:32 PM and was treatment # 9 out of a planned course of 10 treatments.  Radiation Treatment  1). Beam photons with 6-10 energy  2). Brachytherapy None  3). Stereotactic Radiosurgery None  4). Other Radiation None     Cherell Colvin, Sahar Ryback, RT (T)

## 2018-12-25 NOTE — Sedation Documentation (Signed)
There will be no sedation for this case. Writer hung abx per MD verbal order. Tech will do time out and finish case

## 2018-12-25 NOTE — Progress Notes (Addendum)
[name] said that he was little bit disoriented part of yesterday.  I am not sure as to what happened to him.  Looks like he had some V. tach last night.  I think he was asymptomatic with this.  He says his pain is still fairly well controlled.  He says that he can move around a little bit better.  He can sit in a chair.  This helps with his eating.  He will have radiation therapy today.  Hopefully, he might go home tomorrow.  Blood cultures recently were negative for the enterococcus bacteremia.  The enterococcus is sensitive to ampicillin.  This is all that he is on right now.  I am unsure if he is going to get a TEE.  He has been afebrile.  He has a Port-A-Cath in.  Again, I do NOT think that the Port-A-Cath needs to come out since his cultures are negative and he has been afebrile.  His LFTs are little bit elevated today.  I am unsure as to why they would be.  It might be from the antibiotics.  His bilirubin is 1.7.  SGPT 172 and SGOT or 204 is 104.  His creatinine 0.71.  His blood count shows a white cell count of 5.1.  Hemoglobin 11.6.  Platelet count 49,000.  It would not surprise me if his thrombocytopenia persists for a little bit because of the bacteremia.  I am sure he probably has some additional splenic sequestration of his platelets.  His vital signs are all stable.  Blood pressure is 117/54.  Temperature 98.1.  His oral exam is somewhat dry.  There is no thrush.  Lungs are clear.  Cardiac exam regular rate and rhythm.  Abdomen is morbidly obese.  Abdomen is soft.  Bowel sounds are present.  Extremities shows no clubbing, cyanosis or edema.  He has a wrap on the right lower leg.  Neurological exam is nonfocal.  He is on hydrocortisone.  I suppose this might be for adrenal insufficiency.  Hopefully he will come off this.  Again, I will know if he will be going home tomorrow.  I am unsure how long he will need IV antibiotics for the enterococcus bacteremia.  I would not plan for  any systemic therapy for the metastatic liver cancer for at least a week.  Again, he is incredibly pleased about the care that he is getting from the staff on 4 E.  Lattie Haw, MD  [facility] 21:4-5

## 2018-12-25 NOTE — Progress Notes (Signed)
Nutrition Follow-up  RD working remotely.   DOCUMENTATION CODES:   Morbid obesity  INTERVENTION:  - Continue Glucerna shake TID, each bottle provides 220 kcal and 10 grams of protein. - Continue and prostat TID, each packet provides 100 kcal and 15 grams of protein. - Continue to encourage PO intakes.    NUTRITION DIAGNOSIS:   Inadequate oral intake related to acute illness, decreased appetite as evidenced by meal completion < 50%. -improving  GOAL:   Patient will meet greater than or equal to 90% of their needs -minimally met on average   MONITOR:   PO intake, Supplement acceptance, Weight trends, Labs  ASSESSMENT:   78 year old with past medical history relevant for CAD s/p CABG, s/p pacemaker placement, atrial fibrillation, stage 3 CKD, OSA, type 2 DM, NASH cirrhosis, recent diagnosis of metastatic hepatocellular carcinoma with disease to thoracic spine, and HTN. He was admitted with near syncope and found to be in complete heart block s/p pacemaker adjustment.  His hospital course has been complicated by bilateral lower extremity cellulitis, AKI on CKD complicated by hyperkalemia, dysphasia found to have esophageal dysmotility, recalcitrant back pain due to T7 pathologic fracture transferred to [facility] on 12/10/2018 for initiation of radiation therapy.  Weight trending back down and is now consistent with admission (3/15) weight. Per review of RN flow sheet, patient consumed 0% of breakfast this AM d/t NPO status (now back on Heart Healthy, Carb Modified diet as of 12:15 PM) but over the past 4-5 days has mainly been eating 100% of meals. This is a big improvement from earlier in admission.   Per RN note today, plan for TEE at [facility] tomorrow. Per Dr. Lisbeth Ply note, d/c still pending but plan is for home with Home Health PT/OT.      Medications reviewed; 2000 units cholecalciferol/day, 20 mg oral pepcid x1 dose 4/6, 50 mg solu-cortef/day, sliding scale novolog, 10 units  novolog TID, 25 units lantus BID, 10 g lactulose BID, daily multivitamin with minerals, 5 ml mycostatin x6/day, 2 g lovaza BID, 250 mg florastor/day, 2 tablets senokot BID. Labs reviewed; CBGs: 73, 82, and 235 mg/dl today,     Diet Order:   Diet Order            Diet heart healthy/carb modified Room service appropriate? Yes; Fluid consistency: Thin; Fluid restriction: 1500 mL Fluid  Diet effective now              EDUCATION NEEDS:   Not appropriate for education at this time  Skin:  Skin Assessment: Reviewed RN Assessment  Last BM:  3/28  Height:   Ht Readings from Last 1 Encounters:  12/21/18 6' 1"  (1.854 m)    Weight:   Wt Readings from Last 1 Encounters:  12/21/18 (!) 137.5 kg    Ideal Body Weight:  83.64 kg  BMI:  Body mass index is 39.99 kg/m.  Estimated Nutritional Needs:   Kcal:  2510-2760 kcal  Protein:  125-140 grams  Fluid:  >/= 2.3 L/day     Jarome Matin, MS, RD, LDN, [facility] Clinical Dietitian Pager # [phone] After hours/weekend pager # [phone]

## 2018-12-25 NOTE — Procedures (Signed)
Interventional Radiology Procedure Note  Procedure: Right chest port removal  Complications: None  Estimated Blood Loss: < 10 mL  Findings: Right chest port removed in entirety. Pocket not grossly infected and closed after irrigation with sterile saline.  Venetia Night. Kathlene Cote, M.D Pager:  [phone]

## 2018-12-25 NOTE — H&P (View-Only) (Signed)
     Discussed with scheduling at endoscopy.  Unable to do TEE secondary to anesthesia and room scheduling.  The patient is scheduled for tomorrow.    Also, I reviewed the telemetry.  There is no VT.  Rhythm described as VT are paced beats.  Patient may eat and should be NPO after MN.

## 2018-12-25 NOTE — Progress Notes (Signed)
Occupational Therapy Treatment Patient Details Name: Robert Gregory MRN: [id number] DOB: [birth year] Today's Date: 12/25/2018    History of present illness Pt is a 78 y/o male admitted 12/03/18 with dizziness, bradycardia and near syncope. Pt with complete heart block and metastatic hepatocellular carinoma with T7 compression fx. Not a candidate for verterbroplasty, to begin palliative radiation at [facility] 3/20.   4/2 in the am developed fever and hypotension-->Transferred to ICU, back to floor 4/3. PMH includes DM, MI, ICD, HTN, HLD, A-fib.   OT comments  Pt is progressing towards goals. He verbalizes understanding of ADL strategies and AE use, however he may benefit from continued reinforcement.  Follow Up Recommendations  Supervision/Assistance - 24 hour;Home health OT    Equipment Recommendations  (pt has toilet riser)    Recommendations for Other Services      Precautions / Restrictions Precautions Precautions: Fall;Back Precaution Booklet Issued: No Precaution Comments: pt does not wish to use brace Required Braces or Orthoses: Spinal Brace Spinal Brace: Thoracolumbosacral orthotic Restrictions Weight Bearing Restrictions: No       Mobility Bed Mobility Overal bed mobility: Needs Assistance Bed Mobility: Rolling;Sidelying to Sit;Sit to Sidelying Rolling: Min guard;Min assist Sidelying to sit: Min assist;Min guard       General bed mobility comments: cues to log roll, incr time, assist to bring LEs into bed  Transfers Overall transfer level: Needs assistance Equipment used: Rolling walker (2 wheeled) Transfers: Sit to/from Stand Sit to Stand: Min guard Stand pivot transfers: Min assist       General transfer comment: cues for safe technique    Balance Overall balance assessment: Needs assistance Sitting-balance support: Feet supported;No upper extremity supported Sitting balance-Leahy Scale: Good     Standing balance support: Bilateral upper extremity  supported Standing balance-Leahy Scale: Fair                             ADL either performed or assessed with clinical judgement   ADL Overall ADL's : Needs assistance/impaired Eating/Feeding: Set up;Sitting   Grooming: Min guard;Standing;Oral care       Lower Body Bathing: Sit to/from stand;Moderate assistance Lower Body Bathing Details (indicate cue type and reason): Pt required assistance for pericare due to fatigue and incontinence, barrier cream applied     Lower Body Dressing: Minimal assistance Lower Body Dressing Details (indicate cue type and reason): educated in use of reacher for doffing socks, verbal instruction in use for donning pants, Pt returned demonstration f use of sock aide with min v.c, Pt was verbally instructed in use of long handled shoe horn             Functional mobility during ADLs: Minimal assistance;Rolling walker General ADL Comments: Pt was issued sockaide, reacher and long handled shoehorn for use at home.      Vision            Praxis      Cognition Arousal/Alertness: Awake/alert Behavior During Therapy: WFL for tasks assessed/performed Overall Cognitive Status: Within Functional Limits for tasks assessed                         Following Commands: Follows one step commands with increased time                           Pertinent Vitals/ Pain       Pain Assessment: No/denies  pain                                            Prior Functioning/Environment              Frequency  Min 2X/week        Progress Toward Goals  OT Goals(current goals can now be found in the care plan section)  Progress towards OT goals: Progressing toward goals  Acute Rehab OT Goals Patient Stated Goal: home, hopefully Monday Time For Goal Achievement: 12/19/18 Potential to Achieve Goals: Good  Plan Discharge plan remains appropriate    Co-evaluation                 AM-PAC OT "6  Clicks" Daily Activity     Outcome Measure   Help from another person eating meals?: None Help from another person taking care of personal grooming?: A Little Help from another person toileting, which includes using toliet, bedpan, or urinal?: A Little Help from another person bathing (including washing, rinsing, drying)?: A Little Help from another person to put on and taking off regular upper body clothing?: A Little Help from another person to put on and taking off regular lower body clothing?: A Little 6 Click Score: 19    End of Session Equipment Utilized During Treatment: Gait belt;Rolling walker  OT Visit Diagnosis: Unsteadiness on feet (R26.81);Other abnormalities of gait and mobility (R26.89);Muscle weakness (generalized) (M62.81)   Activity Tolerance Patient tolerated treatment well;Other (comment)(lunch came and pt wanted to eat)   Patient Left with call bell/phone within reach;in chair;with chair alarm set   Nurse Communication Mobility status        Time: 1100-1136 OT Time Calculation (min): 36 min  Charges: OT General Charges $OT Visit: 1 Visit OT Treatments $Self Care/Home Management : 23-37 mins  Robert Gregory, OTR/L 3:10 PM 12/25/18   Robert Gregory 12/25/2018, 3:07 PM

## 2018-12-26 ENCOUNTER — Encounter (HOSPITAL_COMMUNITY)
Admission: EM | Disposition: A | Discharge status: Home / Self Care | Payer: Self-pay | Source: Home / Self Care | Attending: Internal Medicine

## 2018-12-26 ENCOUNTER — Encounter: Payer: Self-pay | Admitting: Radiation Oncology

## 2018-12-26 ENCOUNTER — Inpatient Hospital Stay (HOSPITAL_COMMUNITY)
Admit: 2018-12-26 | Discharge: 2018-12-26 | Disposition: A | Discharge status: Home / Self Care | Payer: Medicare Other | Attending: Cardiology | Admitting: Cardiology

## 2018-12-26 ENCOUNTER — Ambulatory Visit
Admit: 2018-12-26 | Discharge: 2018-12-26 | Disposition: A | Discharge status: Home / Self Care | Payer: Medicare Other | Attending: Radiation Oncology | Admitting: Radiation Oncology

## 2018-12-26 ENCOUNTER — Inpatient Hospital Stay (HOSPITAL_COMMUNITY): Payer: Medicare Other | Admitting: Certified Registered Nurse Anesthetist

## 2018-12-26 ENCOUNTER — Inpatient Hospital Stay (HOSPITAL_COMMUNITY): Payer: Medicare Other

## 2018-12-26 ENCOUNTER — Encounter (HOSPITAL_COMMUNITY): Payer: Self-pay | Admitting: Interventional Radiology

## 2018-12-26 DIAGNOSIS — E1122 Type 2 diabetes mellitus with diabetic chronic kidney disease: Secondary | ICD-10-CM

## 2018-12-26 DIAGNOSIS — I340 Nonrheumatic mitral (valve) insufficiency: Secondary | ICD-10-CM

## 2018-12-26 DIAGNOSIS — N189 Chronic kidney disease, unspecified: Secondary | ICD-10-CM

## 2018-12-26 DIAGNOSIS — R509 Fever, unspecified: Secondary | ICD-10-CM

## 2018-12-26 DIAGNOSIS — R7881 Bacteremia: Secondary | ICD-10-CM

## 2018-12-26 DIAGNOSIS — I872 Venous insufficiency (chronic) (peripheral): Secondary | ICD-10-CM

## 2018-12-26 HISTORY — PX: TEE WITHOUT CARDIOVERSION: SHX5443

## 2018-12-26 LAB — CREATININE, SERUM
Creatinine, Ser: 0.92 mg/dL (ref 0.61–1.24)
GFR calc Af Amer: >60 mL/min (ref >60)
GFR calc non Af Amer: >60 mL/min (ref >60)

## 2018-12-26 LAB — COMPREHENSIVE METABOLIC PANEL
ALT: 174 U/L — ABNORMAL HIGH (ref 0–44)
AST: 124 U/L — ABNORMAL HIGH (ref 15–41)
Albumin: 3.4 g/dL — ABNORMAL LOW (ref 3.5–5.0)
Alkaline Phosphatase: 299 U/L — ABNORMAL HIGH (ref 38–126)
Anion gap: 10 (ref 5–15)
BUN: 35 mg/dL — ABNORMAL HIGH (ref 8–23)
CO2: 26 mmol/L (ref 22–32)
Calcium: 8.9 mg/dL (ref 8.9–10.3)
Chloride: 106 mmol/L (ref 98–111)
Creatinine, Ser: 0.89 mg/dL (ref 0.61–1.24)
GFR calc Af Amer: >60 mL/min (ref >60)
GFR calc non Af Amer: >60 mL/min (ref >60)
Glucose, Bld: 93 mg/dL (ref 70–99)
Potassium: 4.1 mmol/L (ref 3.5–5.1)
Sodium: 142 mmol/L (ref 135–145)
Total Bilirubin: 2.1 mg/dL — ABNORMAL HIGH (ref 0.3–1.2)
Total Protein: 6.6 g/dL (ref 6.5–8.1)

## 2018-12-26 LAB — CBC WITH DIFFERENTIAL/PLATELET
Abs Immature Granulocytes: 0.07 10*3/uL (ref 0.00–0.07)
Basophils Absolute: 0 10*3/uL (ref 0.0–0.1)
Basophils Relative: 0 %
Eosinophils Absolute: 0.2 10*3/uL (ref 0.0–0.5)
Eosinophils Relative: 3 %
HCT: 40 % (ref 39.0–52.0)
Hemoglobin: 12.6 g/dL — ABNORMAL LOW (ref 13.0–17.0)
Immature Granulocytes: 1 %
Lymphocytes Relative: 9 %
Lymphs Abs: 0.6 10*3/uL — ABNORMAL LOW (ref 0.7–4.0)
MCH: 31.3 pg (ref 26.0–34.0)
MCHC: 31.5 g/dL (ref 30.0–36.0)
MCV: 99.5 fL (ref 80.0–100.0)
Monocytes Absolute: 0.8 10*3/uL (ref 0.1–1.0)
Monocytes Relative: 12 %
Neutro Abs: 4.9 10*3/uL (ref 1.7–7.7)
Neutrophils Relative %: 75 %
Platelets: 52 10*3/uL — ABNORMAL LOW (ref 150–400)
RBC: 4.02 MIL/uL — ABNORMAL LOW (ref 4.22–5.81)
RDW: 20 % — ABNORMAL HIGH (ref 11.5–15.5)
WBC: 6.6 10*3/uL (ref 4.0–10.5)
nRBC: 0 % (ref 0.0–0.2)

## 2018-12-26 LAB — GLUCOSE, CAPILLARY
Glucose-Capillary: 118 mg/dL — ABNORMAL HIGH (ref 70–99)
Glucose-Capillary: 154 mg/dL — ABNORMAL HIGH (ref 70–99)
Glucose-Capillary: 188 mg/dL — ABNORMAL HIGH (ref 70–99)
Glucose-Capillary: 191 mg/dL — ABNORMAL HIGH (ref 70–99)
Glucose-Capillary: 83 mg/dL (ref 70–99)

## 2018-12-26 SURGERY — ECHOCARDIOGRAM, TRANSESOPHAGEAL
Anesthesia: Monitor Anesthesia Care

## 2018-12-26 MED ORDER — PROPOFOL 10 MG/ML IV BOLUS
INTRAVENOUS | Status: DC | PRN
  Administered 2018-12-26: 10 mg via INTRAVENOUS
  Administered 2018-12-26 (×3): 20 mg via INTRAVENOUS
  Administered 2018-12-26: 10 mg via INTRAVENOUS
  Administered 2018-12-26: 20 mg via INTRAVENOUS
  Administered 2018-12-26: 30 mg via INTRAVENOUS
  Administered 2018-12-26: 20 mg via INTRAVENOUS
  Administered 2018-12-26: 10 mg via INTRAVENOUS
  Administered 2018-12-26: 20 mg via INTRAVENOUS

## 2018-12-26 MED ORDER — PHENYLEPHRINE 40 MCG/ML (10ML) SYRINGE FOR IV PUSH (FOR BLOOD PRESSURE SUPPORT)
PREFILLED_SYRINGE | INTRAVENOUS | Status: DC | PRN
  Administered 2018-12-26 (×2): 40 ug via INTRAVENOUS
  Administered 2018-12-26: 80 ug via INTRAVENOUS

## 2018-12-26 MED ORDER — LIDOCAINE 2% (20 MG/ML) 5 ML SYRINGE
INTRAMUSCULAR | Status: DC | PRN
  Administered 2018-12-26: 100 mg via INTRAVENOUS

## 2018-12-26 MED ORDER — SODIUM CHLORIDE 0.9 % IV SOLN
INTRAVENOUS | Status: DC
  Administered 2018-12-26: 09:00:00 via INTRAVENOUS

## 2018-12-26 NOTE — Progress Notes (Signed)
Pt has been NPO since midnight. VSS. Pt alert and oriented x4 this shift but shows signs of forgetfulness and poor attention/concentration at times. Awaiting transport for pt's TEE.

## 2018-12-26 NOTE — Progress Notes (Signed)
  Echocardiogram Echocardiogram Transesophageal has been performed.  Robert Gregory 12/26/2018, 10:48 AM

## 2018-12-26 NOTE — Anesthesia Preprocedure Evaluation (Signed)
Anesthesia Evaluation  Patient identified by MRN, date of birth, ID band Patient awake    Reviewed: Allergy & Precautions, NPO status , Patient's Chart, lab work & pertinent test results  History of Anesthesia Complications Negative for: history of anesthetic complications  Airway Mallampati: III  TM Distance: >3 FB Neck ROM: Full    Dental  (+) Dental Advisory Given, Teeth Intact   Pulmonary sleep apnea ,    breath sounds clear to auscultation       Cardiovascular hypertension, Pt. on medications and Pt. on home beta blockers + CAD, + Past MI, + CABG and +CHF  + dysrhythmias Atrial Fibrillation + Cardiac Defibrillator  Rhythm:Regular     Neuro/Psych  Neuromuscular disease negative psych ROS   GI/Hepatic negative GI ROS, Neg liver ROS,   Endo/Other  diabetes, Type 2, Insulin Dependent, Oral Hypoglycemic AgentsMorbid obesity  Renal/GU CRFRenal disease     Musculoskeletal  (+) Arthritis ,   Abdominal   Peds  Hematology Plts 96   Anesthesia Other Findings   Reproductive/Obstetrics                             Anesthesia Physical Anesthesia Plan  ASA: IV  Anesthesia Plan: MAC   Post-op Pain Management:    Induction: Intravenous  PONV Risk Score and Plan: 1 and Treatment may vary due to age or medical condition and Propofol infusion  Airway Management Planned: Nasal Cannula  Additional Equipment: None  Intra-op Plan:   Post-operative Plan:   Informed Consent: I have reviewed the patients History and Physical, chart, labs and discussed the procedure including the risks, benefits and alternatives for the proposed anesthesia with the patient or authorized representative who has indicated his/her understanding and acceptance.     Dental advisory given  Plan Discussed with: CRNA and Surgeon  Anesthesia Plan Comments:         Anesthesia Quick Evaluation

## 2018-12-26 NOTE — Progress Notes (Signed)
Robert Gregory had the Port-A-Cath removed yesterday.  In discussions with cardiology and infectious disease, it was felt that the Port-A-Cath, even though recent blood cultures were negative and he was afebrile, that there still might be microscopic colonies on the Port-A-Cath and this could eventually affect the pacemaker which would be disastrous.  The Port-A-Cath came out without difficulty.  He will need to have another Port-A-Cath placed once all is found to be negative with his blood cultures and he completes his antibiotics.  He is going for a TEE today.  He is a little bit irritated today.  He completed his radiation yesterday.  I think he wanted to go home.  I explained to him that we had to "tie up a loose end" and that was to do the TEE to make sure that there are no bacterial colonies on his heart valves.  There are no labs today so far.  He is on ampicillin for the enterococcus bacteremia.  He still has some bleeding from the scrotal area.  He really is not complaining too much of pain.  His vital signs show temperature 98.6.  Pulse 81.  Blood pressure 115/74.  Head neck exam shows no oral thrush.  He has no adenopathy in the neck.  Lungs are clear bilaterally.  Cardiac exam regular rate and rhythm.  He has no murmurs.  Abdomen is obese but soft.  Bowel sounds are present.  There is no guarding or rebound tenderness.  Extremities shows the dressing on the right lower leg.  Neurological exam shows no focal neurological deficits.  Again, he does seem to be a little irritable this morning.  Robert Gregory completed radiation for his spinal pathologic fracture.  He really did well with this.  It helped with his pain.  Now, we just have to make sure that this enterococcus bacteremia will not cause problems down the road.  I understand why the Port-A-Cath had a come out.  If he did not have a pacemaker in, I do think that the Port-A-Cath would have stayed in .  We will have to have the new  Port-A-Cath placed as an outpatient.  He will need to have a PICC line placed for outpatient antibiotics with the ampicillin.  I very much appreciate everybody's help with Robert Gregory.  I know this is a complicated case.  Lattie Haw, MD  Robert Gregory 22:19

## 2018-12-26 NOTE — Progress Notes (Signed)
Cardiologist aware of patient runs of v tach and said the beats were paced beats not v tach.

## 2018-12-26 NOTE — CV Procedure (Signed)
Procedure: TEE  Sedation: Per anesthesiology  Indication: Endocarditis  Findings: Please see echo section for full report.  Normal LV size with mild LV hypertrophy.  EF 55-60% with normal wall motion.  Mildly dilated RV with normal systolic function.  Normal left atrial size, no LA appendage thrombus.  Mild right atrial enlargement.  Trivial TR, no tricuspid valve vegetation.  No pulmonic valve vegetation.  Trileaflet, mildly calcified aortic valve with no stenosis or regurgitation.  No AoV vegetation.  Mild mitral regurgitation.  No mitral valve vegetation.  There is a pacemaker in the right heart.  There was no large vegetation present, but there was a small, mobile filamentous structure attached to the pacemaker near where it crosses the tricuspid valve. This is somewhat nonspecific, cannot rule out a vegetation but could just be sterile thrombus material.   Robert Gregory 12/26/2018 10:18 AM

## 2018-12-26 NOTE — Progress Notes (Signed)
PT Cancellation Note  Patient Details Name: Robert Gregory MRN: [id number] DOB: 1941/07/12   Cancelled Treatment:    Reason Eval/Treat Not Completed: Patient at procedure or test/unavailable   Stanley Lyness,KATHrine E 12/26/2018, 8:57 AM Carmelia Bake, PT, DPT Acute Rehabilitation Services Office: [phone] Pager: [phone]

## 2018-12-26 NOTE — Anesthesia Postprocedure Evaluation (Signed)
Anesthesia Post Note  Patient: Robert Gregory  Procedure(s) Performed: TRANSESOPHAGEAL ECHOCARDIOGRAM (TEE) (N/A )     Patient location during evaluation: Endoscopy Anesthesia Type: MAC Level of consciousness: awake and alert Pain management: pain level controlled Vital Signs Assessment: post-procedure vital signs reviewed and stable Respiratory status: spontaneous breathing, nonlabored ventilation, respiratory function stable and patient connected to nasal cannula oxygen Cardiovascular status: stable and blood pressure returned to baseline Postop Assessment: no apparent nausea or vomiting Anesthetic complications: no    Last Vitals:  Vitals:   12/26/18 1016 12/26/18 1026  BP: 132/70 139/75  Pulse: 83 90  Resp: 14 18  Temp: 36.8 C   SpO2:  96%    Last Pain:  Vitals:   12/26/18 1026  TempSrc:   PainSc: 0-No pain                 Dashel Goines

## 2018-12-26 NOTE — Transfer of Care (Signed)
Immediate Anesthesia Transfer of Care Note  Patient: Robert Gregory  Procedure(s) Performed: TRANSESOPHAGEAL ECHOCARDIOGRAM (TEE) (N/A )  Patient Location: Endoscopy Unit  Anesthesia Type:MAC  Level of Consciousness: drowsy  Airway & Oxygen Therapy: Patient Spontanous Breathing and Patient connected to nasal cannula oxygen  Post-op Assessment: Report given to RN and Post -op Vital signs reviewed and stable  Post vital signs: Reviewed and stable  Last Vitals: see flowsheets Vitals Value Taken Time  BP    Temp    Pulse    Resp    SpO2      Last Pain:  Vitals:   12/26/18 0844  TempSrc: Oral  PainSc: 6       Patients Stated Pain Goal: 2 (70/17/79 3903)  Complications: No apparent anesthesia complications

## 2018-12-26 NOTE — Progress Notes (Signed)
Pt has home unit and places self on/off as needed. RT filled water chamber and informed pt to call if he needs further assistance. RT will monitor.

## 2018-12-26 NOTE — Progress Notes (Signed)
OT Cancellation Note  Patient Details Name: Robert Gregory MRN: [id number] DOB: 01/20/1941   Cancelled Treatment:    Reason Eval/Treat Not Completed: Other (comment)  Pt just returned from procedure, will check on patient next day. Theone Murdoch, OTR/L [phone] Robert Gregory 12/26/2018, 11:31 AM

## 2018-12-26 NOTE — Interval H&P Note (Signed)
History and Physical Interval Note:  12/26/2018 9:57 AM  Robert Gregory  has presented today for surgery, with the diagnosis of r/o vegetation on pacer lead.  The various methods of treatment have been discussed with the patient and family. After consideration of risks, benefits and other options for treatment, the patient has consented to  Procedure(s): TRANSESOPHAGEAL ECHOCARDIOGRAM (TEE) (N/A) as a surgical intervention.  The patient's history has been reviewed, patient examined, no change in status, stable for surgery.  I have reviewed the patient's chart and labs.  Questions were answered to the patient's satisfaction.     [name] [employer]

## 2018-12-26 NOTE — Progress Notes (Signed)
[facility] for Infectious Disease   Reason for visit: Follow up on Enterococcal sepsis  Interval History: pt underwent TEE today with result pending; confusion noted yesterday is much improved today as is appetite; received final XRT treatment today     Physical Exam: Vitals:   12/26/18 1036 12/26/18 1118  BP: (!) 157/82 119/71  Pulse: 83 83  Resp: 16 18  Temp:  98.2 F (36.8 C)  SpO2: 100% 97%  Constitutional: patient appears in NAD, A & O x 3 Eyes: anicteric, PERRL, EOMI HENT: no thrush, MMM, fair dentition Respiratory: Normal respiratory effort; CTA B Cardiovascular: RRR, no obvious murmurs GI: soft, nt, obese, diffusely distended, +BS Extrems: RT distal leg wrapped with slight serous drainage but no erythema, 1+ non-pitting LE edema with venous stasis dermatitis noted  Review of Systems: All systems were reviewed and negative except as noted above.  Lab Results  Component Value Date   WBC 6.6 12/26/2018   HGB 12.6 (L) 12/26/2018   HCT 40.0 12/26/2018   MCV 99.5 12/26/2018   PLT 52 (L) 12/26/2018    Lab Results  Component Value Date   CREATININE 0.89 12/26/2018   BUN 35 (H) 12/26/2018   NA 142 12/26/2018   K 4.1 12/26/2018   CL 106 12/26/2018   CO2 26 12/26/2018    Lab Results  Component Value Date   ALT 174 (H) 12/26/2018   AST 124 (H) 12/26/2018   ALKPHOS 299 (H) 12/26/2018     Microbiology: Recent Results (from the past 240 hour(s))  Culture, blood (routine x 2)     Status: Abnormal   Collection Time: 12/21/18  7:50 AM  Result Value Ref Range Status   Specimen Description   Final    BLOOD LEFT ARM Performed at [facility], [place] [address], [place], [place] [address]    Special Requests   Final    BOTTLES DRAWN AEROBIC AND ANAEROBIC Blood Culture adequate volume Performed at [facility] [address] [place], [place] [address]    Culture  Setup Time   Final    GRAM POSITIVE COCCI IN BOTH AEROBIC  AND ANAEROBIC BOTTLES CRITICAL VALUE NOTED.  VALUE IS CONSISTENT WITH PREVIOUSLY REPORTED AND CALLED VALUE.    Culture (A)  Final    ENTEROCOCCUS FAECALIS SUSCEPTIBILITIES PERFORMED ON PREVIOUS CULTURE WITHIN THE LAST 5 DAYS. Performed at [facility], [place] [address], [place], [place] [address]    Report Status 12/23/2018 FINAL  Final  Culture, blood (routine x 2)     Status: Abnormal   Collection Time: 12/21/18  7:51 AM  Result Value Ref Range Status   Specimen Description   Final    BLOOD RIGHT ARM Performed at [facility] [address] [place], [place] [address]    Special Requests   Final    BOTTLES DRAWN AEROBIC AND ANAEROBIC Blood Culture adequate volume Performed at [facility] [address] [place], [place] [address]    Culture  Setup Time   Final    IN BOTH AEROBIC AND ANAEROBIC BOTTLES GRAM POSITIVE COCCI CRITICAL RESULT CALLED TO, READ BACK BY AND VERIFIED WITH: B GREEN PHARMD 12/21/18 2337 JDW Performed at [facility], [place] [address], [place], [place] [address]    Culture ENTEROCOCCUS FAECALIS (A)  Final   Report Status 12/23/2018 FINAL  Final   Organism ID, Bacteria ENTEROCOCCUS FAECALIS  Final      Susceptibility  Enterococcus faecalis - MIC*    AMPICILLIN <=2 SENSITIVE Sensitive     VANCOMYCIN 1 SENSITIVE Sensitive     GENTAMICIN SYNERGY SENSITIVE Sensitive     * ENTEROCOCCUS FAECALIS  Blood Culture ID Panel (Reflexed)     Status: Abnormal   Collection Time: 12/21/18  7:51 AM  Result Value Ref Range Status   Enterococcus species DETECTED (A) NOT DETECTED Final    Comment: CRITICAL RESULT CALLED TO, READ BACK BY AND VERIFIED WITH: B GREEN PHARMD 12/21/18 2337 JDW    Vancomycin resistance NOT DETECTED NOT DETECTED Final   Listeria monocytogenes NOT DETECTED NOT DETECTED Final   Staphylococcus species NOT DETECTED NOT DETECTED Final   Staphylococcus aureus (BCID) NOT DETECTED NOT DETECTED Final    Streptococcus species NOT DETECTED NOT DETECTED Final   Streptococcus agalactiae NOT DETECTED NOT DETECTED Final   Streptococcus pneumoniae NOT DETECTED NOT DETECTED Final   Streptococcus pyogenes NOT DETECTED NOT DETECTED Final   Acinetobacter baumannii NOT DETECTED NOT DETECTED Final   Enterobacteriaceae species NOT DETECTED NOT DETECTED Final   Enterobacter cloacae complex NOT DETECTED NOT DETECTED Final   Escherichia coli NOT DETECTED NOT DETECTED Final   Klebsiella oxytoca NOT DETECTED NOT DETECTED Final   Klebsiella pneumoniae NOT DETECTED NOT DETECTED Final   Proteus species NOT DETECTED NOT DETECTED Final   Serratia marcescens NOT DETECTED NOT DETECTED Final   Haemophilus influenzae NOT DETECTED NOT DETECTED Final   Neisseria meningitidis NOT DETECTED NOT DETECTED Final   Pseudomonas aeruginosa NOT DETECTED NOT DETECTED Final   Candida albicans NOT DETECTED NOT DETECTED Final   Candida glabrata NOT DETECTED NOT DETECTED Final   Candida krusei NOT DETECTED NOT DETECTED Final   Candida parapsilosis NOT DETECTED NOT DETECTED Final   Candida tropicalis NOT DETECTED NOT DETECTED Final    Comment: Performed at [facility], [address], [place], [place] [address]  Culture, Urine     Status: None   Collection Time: 12/21/18  9:59 AM  Result Value Ref Range Status   Specimen Description   Final    URINE, CLEAN CATCH Performed at [facility], [place] [address], [place], [place] [address]    Special Requests   Final    NONE Performed at [facility], [place] [address], [place], [place] [address]    Culture   Final    NO GROWTH Performed at [facility], [place] [address] [place], [place] [address]    Report Status 12/22/2018 FINAL  Final  MRSA PCR Screening     Status: None   Collection Time: 12/21/18  2:48 PM  Result Value Ref Range Status   MRSA by PCR NEGATIVE NEGATIVE Final    Comment:        The GeneXpert MRSA Assay (FDA  approved for NASAL specimens only), is one component of a comprehensive MRSA colonization surveillance program. It is not intended to diagnose MRSA infection nor to guide or monitor treatment for MRSA infections. Performed at [facility], [place] [address], [place], [place] [address]   Culture, blood (routine x 2)     Status: None (Preliminary result)   Collection Time: 12/22/18  9:03 AM  Result Value Ref Range Status   Specimen Description   Final    BLOOD RIGHT HAND Performed at [facility] [address] [place], [place] [address]    Special Requests   Final    BOTTLES DRAWN AEROBIC AND ANAEROBIC Blood  Culture adequate volume Performed at [facility] [address] [place], [place] [address]    Culture   Final    NO GROWTH 4 DAYS Performed at [facility], [place] [address] [place], [place] [address]    Report Status PENDING  Incomplete  Culture, blood (routine x 2)     Status: None (Preliminary result)   Collection Time: 12/22/18  9:06 AM  Result Value Ref Range Status   Specimen Description   Final    BLOOD LEFT ARM Performed at [facility] [address], [place], [place] [address]    Special Requests   Final    BOTTLES DRAWN AEROBIC AND ANAEROBIC Blood Culture adequate volume Performed at [facility] [address], [place], [place] [address]    Culture   Final    NO GROWTH 4 DAYS Performed at [facility], [place] [address], [place], [place] [address]    Report Status PENDING  Incomplete    Impression/Plan: The patient is a 78 y/o obese WM with DM and CRI, h/o heart block, s/p PM, NASH and recently diagnosed metastatic HCC, s/p portocath placement admitted with fever, thrombocytopenia, and Enterococcal sepsis.  1. Enterococcal sepsis - Both initial blood cxs were positive for pan-sensitive Enterococcus faecalis. Urine cx did not suggest this was source  of his infection. Given the advanced stage of his HCC, worry for endogenous GI translocation as this would be most likely cause. This played a role in rationale to remove portocath (along with known data with pathogen adhesion to foreign devices). Repeat blood cxs are showing signs of clearance and TTE was unrevealing. Only viable PO tx option would be zyvox but this is contra-indicated by his concurrent thrombocytopenia. If repeat blood cxs remain negative, may place PICC tomorrow and plan to complete remainder 3 weeks (tentative ABX d/c date would be 01/11/2019, assumng his TEE is also unrevealing) of IV ampicillin 2 gm q 6 hrs as an OP given current renal function. If TEE shows concern for IE, will need to extend treatment to 6 weeks instead.  2. Fever - now resolved with targeted ABX tx. Repeat blood cxs x 2 for fever > 100.5.  3. [facility] - Pt is s/p palliative XRT. Oncologist is eager to initiate immunotherapy soon for tx. Provided this tx would not result in neutropenia, it may be allowable for pt to begin chemo in next 10-14 days. Recommend close OP f/u with oncologist. Suspect transaminitis is due to HCC/tumor burden at this time.

## 2018-12-26 NOTE — Progress Notes (Signed)
PROGRESS NOTE    [name]  DXI:[id number] DOB: [birth year] DOA: 12/03/2018 PCP: Seward Carol, MD   Brief Narrative: Robert Gregory is a 78 y.o. male with a history of coronary artery disease status post CABG, status post pacemaker placement, atrial fibrillation on rivaroxaban, stage III CKD, obstructive sleep apnea, type 2 diabetes, [name] cirrhosis, recent diagnosis of metastatic hepatocellular carcinoma with disease to thoracic spine, hypertension. He was admitted secondary to near syncope which was secondary to complete heart block. Pacemaker adjusted but hospital course complicated by LE cellulitis and enterococcus bacteremia, in addition to severe sepsis requiring a brief course of vasopressor support. ID is on board.   Assessment & Plan:   Principal Problem:   Near syncope Active Problems:   Essential hypertension   Hyperlipidemia   Type 2 diabetes mellitus with stage 3 chronic kidney disease (HCC)   Chronic back pain   Obesity, Class III, BMI 40-49.9 (morbid obesity) (HCC)   Cellulitis of right lower extremity   Complete heart block (HCC)   Acute renal failure superimposed on stage 3 chronic kidney disease (HCC)   Hepatocellular carcinoma (HCC)   Severe sepsis Septic shock Not present on admission. Secondary to enterococcus bacteremia. Unknown source but possibly hepatobiliary per chart review. He has had prolonged cellulitis which be a source. Associated hypotension that required a short course of vasopressors, IV fluids. Started on stress dose steroids -Discontinue Solu-cortef; watch BP -Continue treatment for bacteremia  Enterococcus bacteremia ID on board. Transthoracic Echocardiogram significant for no valve or pacer lead vegetations. Port removed on 4/6. Repeat blood cultures (4/3) no growth to date. Does not appear that a culture was obtained from port. -Continue Ampicillin per ID recommendations -ID recommendations: Transesophageal Echocardiogram pending today  -Transesophageal Echocardiogram postponed until 4/7 secondary to scheduling  Elevated LFTs Trending back up. RUQ ultrasound significant for cholelithiasis with gallbladder wall thickening/edema possibly related to liver process rather than gallbladder. Negative murphy sign. Continues to be asymptomatic. Associated abdominal distension -Repeat US to check for ascites -Consult general surgery for evaluation.  Abdominal distension No pain. Bowel movement yesterday. Bowel sounds present.  Metastatic hepatocellular carcinoma T7 pathologic fracture Followed by oncology, [name]. Receiving radiation therapy. Port-A-Cath placement on 3/31. -Continue Tramadol, fentanyl patch, lidocaine patch, gabapentin -Radiation per radiation oncology  Acute metabolic vs toxic encephalopathy Secondary to medication vs sepsis vs combination per chart review. Resolved.  Confusion Likely hepatic encephalopathy. Mild. Improved. -Continue lactulose, will titrate for BM response  Acute on chronic diastolic heart failure Treated with IV lasix and has resolved. Patient takes lasix as an outpatient which has been held currently secondary to sepsis.  Diabetes mellitus, type 2 Patient is on metformin, U500 150 units at breakfast and 75 units at lunch. He reports hypoglycemia generally in the late afternoon/evening. -Continue carb modified diet -Continue Lantus 25 units twice per day, continue novolog 10 units TID with meals. -Continue SSI  Bilateral lower extremity cellulitis Per chart review, resolved. Resolved.  Dysphagia S/p sleep study on chart review. Nonspecific esophageal dysmotility. Carb/heart healthy diet  CAD Essential hypertension S/p CABG. On Coreg as an outpatient. Currently held secondary to recent hypotension  Hyperlipidemia -Continue pravastatin  Complete heart block Patient presented with near syncope. Cardiology consulted prior. Patient's pacemaker changed from VVI to DDD.     DVT prophylaxis: Xarelto Code Status:   Code Status: DNR Family Communication: None at bedside Disposition Plan: Home pending continued management/workup for bacteremia. Home with HHPT/OT   Consultants:   Infectious disease  Oncology  Cardiology  Procedures:   Pacemaker transition from VVI to DDD pacing on 12/03/2018  Esophagram 12/05/2018 nonspecific esophageal motility disorder  Antimicrobials:  P.o. cephalexin started 12/08/2018 to 12/12/2018  IV cefazolin 12/03/2020 to 12/08/2018  IV vancomycin and Zosyn 12/03/2018 to 12/04/2018  IV ampicillin started for 12/08/2018 ongoing    Subjective: No issues overnight. No abdominal pain. Confusion improved.  Objective: Vitals:   12/25/18 2032 12/26/18 0425 12/26/18 0844 12/26/18 1016  BP: 123/69 115/74 120/64 132/70  Pulse: 85 81 97 83  Resp: 20 20 (!) 26 14  Temp: 98 F (36.7 C) 98.6 F (37 C) 97.8 F (36.6 C) 98.2 F (36.8 C)  TempSrc: Oral Oral Oral Axillary  SpO2: 98% 95% 96%   Weight:      Height:        Intake/Output Summary (Last 24 hours) at 12/26/2018 1017 Last data filed at 12/26/2018 1015 Gross per 24 hour  Intake 500 ml  Output -  Net 500 ml   Filed Weights   12/08/18 0509 12/09/18 0600 12/21/18 1300  Weight: (!) 143.2 kg (!) 146.3 kg (!) 137.5 kg    Examination:  General exam: Appears calm and comfortable Respiratory system: Clear to auscultation. Respiratory effort normal. Cardiovascular system: S1 & S2 heard, RRR. No murmurs, rubs, gallops or clicks. Gastrointestinal system: Abdomen is distended, soft and nontender. No organomegaly or masses felt. Normal bowel sounds heard. Central nervous system: Alert and oriented. No focal neurological deficits. Extremities: No edema. No calf tenderness Skin: No cyanosis. No rashes Psychiatry: Judgement and insight appear normal. Mood & affect appropriate.     Data Reviewed: I have personally reviewed following labs and imaging studies  CBC: Recent Labs   Lab 12/21/18 0418 12/22/18 0500 12/23/18 0432 12/25/18 0335 12/26/18 0756  WBC 7.5 7.8 5.5 5.1 6.6  NEUTROABS  --   --   --   --  4.9  HGB 12.8* 11.3* 10.9* 11.6* 12.6*  HCT 40.7 35.7* 34.5* 36.9* 40.0  MCV 98.3 99.4 98.0 98.7 99.5  PLT 57* 55* 52* 49* 52*   Basic Metabolic Panel: Recent Labs  Lab 12/21/18 0418 12/22/18 0500 12/23/18 0432 12/24/18 0354 12/24/18 1728 12/25/18 0335 12/26/18 0501 12/26/18 0756  NA 134* 136 138  --   --  139  --  142  K 5.0 4.8 4.0  --   --  4.1  --  4.1  CL 95* 102 102  --   --  104  --  106  CO2 28 24 26   --   --  29  --  26  GLUCOSE 183* 210* 304*  --   --  138*  --  93  BUN 40* 44* 46*  --   --  36*  --  35*  CREATININE 0.92 1.01 0.95 0.83  --  0.71 0.92 0.89  CALCIUM 9.3 8.0* 8.2*  --   --  8.6*  --  8.9  MG  --   --   --   --  2.6* 2.5*  --   --    GFR: Estimated Creatinine Clearance: 101.2 mL/min (by C-G formula based on SCr of 0.89 mg/dL). Liver Function Tests: Recent Labs  Lab 12/21/18 0418 12/22/18 0500 12/23/18 0432 12/25/18 0335 12/26/18 0756  AST 163* 115* 83* 104* 124*  ALT 211* 160* 149* 172* 174*  ALKPHOS 337* 238* 228* 267* 299*  BILITOT 2.2* 2.4* 1.7* 1.7* 2.1*  PROT 7.1 5.7* 6.0* 6.2* 6.6  ALBUMIN 3.7 3.1*  3.1* 3.2* 3.4*   No results for input(s): LIPASE, AMYLASE in the last 168 hours. Recent Labs  Lab 12/24/18 1728 12/25/18 0335  AMMONIA 53* 67*   Coagulation Profile: Recent Labs  Lab 12/25/18 1649  INR 1.5*   Cardiac Enzymes: No results for input(s): CKTOTAL, CKMB, CKMBINDEX, TROPONINI in the last 168 hours. BNP (last 3 results) No results for input(s): PROBNP in the last 8760 hours. HbA1C: No results for input(s): HGBA1C in the last 72 hours. CBG: Recent Labs  Lab 12/25/18 0805 12/25/18 1018 12/25/18 1233 12/25/18 1810 12/25/18 2153  GLUCAP 73 82 235* 279* 158*   Lipid Profile: No results for input(s): CHOL, HDL, LDLCALC, TRIG, CHOLHDL, LDLDIRECT in the last 72 hours. Thyroid  Function Tests: No results for input(s): TSH, T4TOTAL, FREET4, T3FREE, THYROIDAB in the last 72 hours. Anemia Panel: No results for input(s): VITAMINB12, FOLATE, FERRITIN, TIBC, IRON, RETICCTPCT in the last 72 hours. Sepsis Labs: Recent Labs  Lab 12/21/18 0750 12/22/18 0500 12/23/18 0432  PROCALCITON 0.20 0.26 0.14    Recent Results (from the past 240 hour(s))  Culture, blood (routine x 2)     Status: Abnormal   Collection Time: 12/21/18  7:50 AM  Result Value Ref Range Status   Specimen Description   Final    BLOOD LEFT ARM Performed at [facility] [address], [place], [place] [address]    Special Requests   Final    BOTTLES DRAWN AEROBIC AND ANAEROBIC Blood Culture adequate volume Performed at [facility] [address] [place], [place] [address]    Culture  Setup Time   Final    GRAM POSITIVE COCCI IN BOTH AEROBIC AND ANAEROBIC BOTTLES CRITICAL VALUE NOTED.  VALUE IS CONSISTENT WITH PREVIOUSLY REPORTED AND CALLED VALUE.    Culture (A)  Final    ENTEROCOCCUS FAECALIS SUSCEPTIBILITIES PERFORMED ON PREVIOUS CULTURE WITHIN THE LAST 5 DAYS. Performed at [facility], [place] [address], [place], [place] [address]    Report Status 12/23/2018 FINAL  Final  Culture, blood (routine x 2)     Status: Abnormal   Collection Time: 12/21/18  7:51 AM  Result Value Ref Range Status   Specimen Description   Final    BLOOD RIGHT ARM Performed at [facility] [address] [place], [place] [address]    Special Requests   Final    BOTTLES DRAWN AEROBIC AND ANAEROBIC Blood Culture adequate volume Performed at [facility] [address], [place], [place] [address]    Culture  Setup Time   Final    IN BOTH AEROBIC AND ANAEROBIC BOTTLES GRAM POSITIVE COCCI CRITICAL RESULT CALLED TO, READ BACK BY AND VERIFIED WITH: B GREEN PHARMD 12/21/18 2337 JDW Performed at [facility], [place] [address] [place], [place] [address]    Culture ENTEROCOCCUS FAECALIS (A)  Final   Report Status 12/23/2018 FINAL  Final   Organism ID, Bacteria ENTEROCOCCUS FAECALIS  Final      Susceptibility   Enterococcus faecalis - MIC*    AMPICILLIN <=2 SENSITIVE Sensitive     VANCOMYCIN 1 SENSITIVE Sensitive     GENTAMICIN SYNERGY SENSITIVE Sensitive     * ENTEROCOCCUS FAECALIS  Blood Culture ID Panel (Reflexed)     Status: Abnormal   Collection Time: 12/21/18  7:51 AM  Result Value Ref Range Status   Enterococcus species DETECTED (A) NOT DETECTED Final    Comment: CRITICAL RESULT CALLED TO, READ BACK  BY AND VERIFIED WITH: B GREEN PHARMD 12/21/18 2337 JDW    Vancomycin resistance NOT DETECTED NOT DETECTED Final   Listeria monocytogenes NOT DETECTED NOT DETECTED Final   Staphylococcus species NOT DETECTED NOT DETECTED Final   Staphylococcus aureus (BCID) NOT DETECTED NOT DETECTED Final   Streptococcus species NOT DETECTED NOT DETECTED Final   Streptococcus agalactiae NOT DETECTED NOT DETECTED Final   Streptococcus pneumoniae NOT DETECTED NOT DETECTED Final   Streptococcus pyogenes NOT DETECTED NOT DETECTED Final   Acinetobacter baumannii NOT DETECTED NOT DETECTED Final   Enterobacteriaceae species NOT DETECTED NOT DETECTED Final   Enterobacter cloacae complex NOT DETECTED NOT DETECTED Final   Escherichia coli NOT DETECTED NOT DETECTED Final   Klebsiella oxytoca NOT DETECTED NOT DETECTED Final   Klebsiella pneumoniae NOT DETECTED NOT DETECTED Final   Proteus species NOT DETECTED NOT DETECTED Final   Serratia marcescens NOT DETECTED NOT DETECTED Final   Haemophilus influenzae NOT DETECTED NOT DETECTED Final   Neisseria meningitidis NOT DETECTED NOT DETECTED Final   Pseudomonas aeruginosa NOT DETECTED NOT DETECTED Final   Candida albicans NOT DETECTED NOT DETECTED Final   Candida glabrata NOT DETECTED NOT DETECTED Final   Candida krusei NOT DETECTED NOT DETECTED Final   Candida parapsilosis NOT DETECTED  NOT DETECTED Final   Candida tropicalis NOT DETECTED NOT DETECTED Final    Comment: Performed at [facility], [place] [address] [place], [place] [address]  Culture, Urine     Status: None   Collection Time: 12/21/18  9:59 AM  Result Value Ref Range Status   Specimen Description   Final    URINE, CLEAN CATCH Performed at [facility], [place] [address] [place], [place] [address]    Special Requests   Final    NONE Performed at [facility], [place] [address], [place], [place] [address]    Culture   Final    NO GROWTH Performed at [facility], [place] [address] [place], [place] [address]    Report Status 12/22/2018 FINAL  Final  MRSA PCR Screening     Status: None   Collection Time: 12/21/18  2:48 PM  Result Value Ref Range Status   MRSA by PCR NEGATIVE NEGATIVE Final    Comment:        The GeneXpert MRSA Assay (FDA approved for NASAL specimens only), is one component of a comprehensive MRSA colonization surveillance program. It is not intended to diagnose MRSA infection nor to guide or monitor treatment for MRSA infections. Performed at [facility], [place] [address], [place], [place] [address]   Culture, blood (routine x 2)     Status: None (Preliminary result)   Collection Time: 12/22/18  9:03 AM  Result Value Ref Range Status   Specimen Description   Final    BLOOD RIGHT HAND Performed at [facility] [address], [place], [place] [address]    Special Requests   Final    BOTTLES DRAWN AEROBIC AND ANAEROBIC Blood Culture adequate volume Performed at [facility] [address], [place], [place] [address]    Culture   Final    NO GROWTH 4 DAYS Performed at [facility], [place] [address] [place], [place] [address]    Report Status PENDING  Incomplete  Culture, blood (routine x 2)     Status: None (Preliminary result)   Collection Time: 12/22/18  9:06 AM   Result Value Ref Range Status  Specimen Description   Final    BLOOD LEFT ARM Performed at [facility] [address], [place], [place] [address]    Special Requests   Final    BOTTLES DRAWN AEROBIC AND ANAEROBIC Blood Culture adequate volume Performed at [facility] [address], [place], [place] [address]    Culture   Final    NO GROWTH 4 DAYS Performed at [facility], [place] [address], [place], [place] [address]    Report Status PENDING  Incomplete         Radiology Studies: Ir Removal Tun Access W/ [place] W/o [place] Mod Sed  Result Date: 12/26/2018 CLINICAL DATA:  Bacteremia and need for removal of port placed on 12/19/2018 due to concern for seeding of port catheter and presence of indwelling pacemaker. EXAM: REMOVAL OF IMPLANTED TUNNELED PORT-A-CATH MEDICATIONS: 2 grams IV Ancef. PROCEDURE: The right chest Port-A-Cath site was prepped with chlorhexidine. A sterile gown and gloves were worn during the procedure. Local anesthesia was provided with 1% lidocaine. An incision was made overlying the Port-A-Cath with a #15 scalpel. Utilizing sharp and blunt dissection, the Port-A-Cath was removed. Portable cautery was utilized. Retention sutures were removed. The pocked was irrigated with sterile saline. Wound closure was performed with subcutaneous 3-0 Monocryl, subcuticular 4-0 Vicryl and Dermabond. FINDINGS: The port pocket was not overtly infected and therefore the pocket was closed. The entire Port-A-Cath was removed successfully. IMPRESSION: Removal of implanted Port-A-Cath utilizing sharp and blunt dissection. The procedure was uncomplicated. Electronically Signed   By: Aletta Edouard M.D.   On: 12/26/2018 08:10        Scheduled Meds: . [MAR Hold] calcitonin (salmon)  1 spray Alternating Nares Daily  . [MAR Hold] Chlorhexidine Gluconate Cloth  6 each Topical Daily  . [MAR Hold] cholecalciferol  2,000 Units Oral Q supper  . [MAR Hold]  ezetimibe  10 mg Oral Daily  . [MAR Hold] feeding supplement (GLUCERNA SHAKE)  237 mL Oral TID BM  . [MAR Hold] feeding supplement (PRO-STAT SUGAR FREE 64)  30 mL Oral TID BM  . [MAR Hold] fenofibrate  160 mg Oral Daily  . [MAR Hold] fentaNYL  1 patch Transdermal Q72H  . [MAR Hold] gabapentin  400 mg Oral TID  . [MAR Hold] hydrocortisone sod succinate (SOLU-CORTEF) inj  50 mg Intravenous Daily  . [MAR Hold] insulin aspart  0-5 Units Subcutaneous QHS  . [MAR Hold] insulin aspart  0-9 Units Subcutaneous TID WC  . [MAR Hold] insulin aspart  10 Units Subcutaneous TID WC  . [MAR Hold] insulin glargine  25 Units Subcutaneous BID  . [MAR Hold] lactulose  10 g Oral BID  . [MAR Hold] lidocaine  1 patch Transdermal Q24H  . [MAR Hold] mouth rinse  15 mL Mouth Rinse BID  . [MAR Hold] multivitamin with minerals  1 tablet Oral Daily  . [MAR Hold] nystatin  5 mL Oral 6 X Daily  . [MAR Hold] omega-3 acid ethyl esters  2 g Oral BID  . [MAR Hold] rivaroxaban  20 mg Oral Q supper  . [MAR Hold] saccharomyces boulardii  250 mg Oral Daily  . [MAR Hold] senna-docusate  2 tablet Oral BID  . [MAR Hold] sodium bicarbonate/sodium chloride   Mouth Rinse QID  . [MAR Hold] sodium chloride flush  10-40 mL Intracatheter Q12H  . [MAR Hold] sodium chloride flush  3 mL Intravenous Q12H   Continuous Infusions: . sodium chloride Stopped (12/26/18 1015)  . [facility]  Hold] ampicillin (OMNIPEN) IV 2 g (12/26/18 0442)     LOS: 23 days     Cordelia Poche, MD [facility] 12/26/2018, 10:17 AM  If 7PM-7AM, please contact night-coverage [email or web address]

## 2018-12-27 ENCOUNTER — Encounter: Payer: Self-pay | Admitting: Hematology & Oncology

## 2018-12-27 ENCOUNTER — Inpatient Hospital Stay (HOSPITAL_COMMUNITY): Payer: Medicare Other

## 2018-12-27 ENCOUNTER — Inpatient Hospital Stay: Payer: Self-pay

## 2018-12-27 DIAGNOSIS — E669 Obesity, unspecified: Secondary | ICD-10-CM

## 2018-12-27 LAB — CBC WITH DIFFERENTIAL/PLATELET
Abs Immature Granulocytes: 0.05 10*3/uL (ref 0.00–0.07)
Basophils Absolute: 0 10*3/uL (ref 0.0–0.1)
Basophils Relative: 1 %
Eosinophils Absolute: 0.2 10*3/uL (ref 0.0–0.5)
Eosinophils Relative: 3 %
HCT: 38.9 % — ABNORMAL LOW (ref 39.0–52.0)
Hemoglobin: 12.1 g/dL — ABNORMAL LOW (ref 13.0–17.0)
Immature Granulocytes: 1 %
Lymphocytes Relative: 8 %
Lymphs Abs: 0.5 10*3/uL — ABNORMAL LOW (ref 0.7–4.0)
MCH: 30.9 pg (ref 26.0–34.0)
MCHC: 31.1 g/dL (ref 30.0–36.0)
MCV: 99.2 fL (ref 80.0–100.0)
Monocytes Absolute: 0.7 10*3/uL (ref 0.1–1.0)
Monocytes Relative: 11 %
Neutro Abs: 4.7 10*3/uL (ref 1.7–7.7)
Neutrophils Relative %: 76 %
Platelets: 45 10*3/uL — ABNORMAL LOW (ref 150–400)
RBC: 3.92 MIL/uL — ABNORMAL LOW (ref 4.22–5.81)
RDW: 19.9 % — ABNORMAL HIGH (ref 11.5–15.5)
WBC: 6.2 10*3/uL (ref 4.0–10.5)
nRBC: 0 % (ref 0.0–0.2)

## 2018-12-27 LAB — COMPREHENSIVE METABOLIC PANEL
ALT: 158 U/L — ABNORMAL HIGH (ref 0–44)
AST: 126 U/L — ABNORMAL HIGH (ref 15–41)
Albumin: 3.1 g/dL — ABNORMAL LOW (ref 3.5–5.0)
Alkaline Phosphatase: 333 U/L — ABNORMAL HIGH (ref 38–126)
Anion gap: 8 (ref 5–15)
BUN: 27 mg/dL — ABNORMAL HIGH (ref 8–23)
CO2: 26 mmol/L (ref 22–32)
Calcium: 8.5 mg/dL — ABNORMAL LOW (ref 8.9–10.3)
Chloride: 105 mmol/L (ref 98–111)
Creatinine, Ser: 0.79 mg/dL (ref 0.61–1.24)
GFR calc Af Amer: >60 mL/min (ref >60)
GFR calc non Af Amer: >60 mL/min (ref >60)
Glucose, Bld: 141 mg/dL — ABNORMAL HIGH (ref 70–99)
Potassium: 4.3 mmol/L (ref 3.5–5.1)
Sodium: 139 mmol/L (ref 135–145)
Total Bilirubin: 2.1 mg/dL — ABNORMAL HIGH (ref 0.3–1.2)
Total Protein: 6.1 g/dL — ABNORMAL LOW (ref 6.5–8.1)

## 2018-12-27 LAB — CULTURE, BLOOD (ROUTINE X 2)
Culture: NO GROWTH
Culture: NO GROWTH
Special Requests: ADEQUATE
Special Requests: ADEQUATE

## 2018-12-27 LAB — GLUCOSE, CAPILLARY
Glucose-Capillary: 123 mg/dL — ABNORMAL HIGH (ref 70–99)
Glucose-Capillary: 126 mg/dL — ABNORMAL HIGH (ref 70–99)
Glucose-Capillary: 195 mg/dL — ABNORMAL HIGH (ref 70–99)
Glucose-Capillary: 164 mg/dL — ABNORMAL HIGH (ref 70–99)
Glucose-Capillary: 89 mg/dL (ref 70–99)

## 2018-12-27 MED ORDER — LACTULOSE 10 GM/15ML PO SOLN: 10.0000 g | Freq: Two times a day (BID) | ORAL | 0 refills | Status: AC

## 2018-12-27 MED ORDER — OXYCODONE-ACETAMINOPHEN 7.5-325 MG PO TABS: 1.0000 | ORAL_TABLET | Freq: Three times a day (TID) | ORAL | 0 refills | Status: DC | PRN

## 2018-12-27 MED ORDER — GLUCERNA SHAKE PO LIQD: 237.0000 mL | Freq: Three times a day (TID) | ORAL | 0 refills | Status: AC

## 2018-12-27 MED ORDER — FENTANYL 25 MCG/HR TD PT72: 1.0000 | MEDICATED_PATCH | TRANSDERMAL | 0 refills | Status: DC

## 2018-12-27 MED ORDER — ORAL CARE MOUTH RINSE: 15.0000 mL | Freq: Two times a day (BID) | OROMUCOSAL | 0 refills | Status: DC

## 2018-12-27 MED ORDER — SODIUM CHLORIDE 0.9 % IV SOLN
2.0000 g | Freq: Two times a day (BID) | INTRAVENOUS | Status: DC
  Administered 2018-12-27 – 2018-12-28 (×3): 2 g via INTRAVENOUS
  Filled 2018-12-27: qty 2
  Filled 2018-12-27: qty 20
  Filled 2018-12-27 (×2): qty 2

## 2018-12-27 MED ORDER — SODIUM CHLORIDE 0.9 % IV SOLN: 2.0000 g | INTRAVENOUS | 0 refills | Status: DC

## 2018-12-27 MED ORDER — TRAMADOL HCL 50 MG PO TABS: 25.0000 mg | ORAL_TABLET | Freq: Four times a day (QID) | ORAL | 0 refills | Status: DC | PRN

## 2018-12-27 MED ORDER — SENNOSIDES-DOCUSATE SODIUM 8.6-50 MG PO TABS: 2.0000 | ORAL_TABLET | Freq: Two times a day (BID) | ORAL | Status: AC

## 2018-12-27 MED ORDER — GABAPENTIN 400 MG PO CAPS: 400.0000 mg | ORAL_CAPSULE | Freq: Three times a day (TID) | ORAL | 0 refills | Status: AC

## 2018-12-27 MED ORDER — CEFTRIAXONE IV (FOR PTA / DISCHARGE USE ONLY): 2.0000 g | Freq: Two times a day (BID) | INTRAVENOUS | 0 refills | Status: AC

## 2018-12-27 MED ORDER — LIDOCAINE 5 % EX PTCH: 1.0000 | MEDICATED_PATCH | CUTANEOUS | 0 refills | Status: AC

## 2018-12-27 MED ORDER — AMPICILLIN IV (FOR PTA / DISCHARGE USE ONLY): 12.0000 g | INTRAVENOUS | 0 refills | Status: AC

## 2018-12-27 MED ORDER — CALCITONIN (SALMON) 200 UNIT/ACT NA SOLN: 1.0000 | Freq: Every day | NASAL | 12 refills | Status: AC

## 2018-12-27 MED ORDER — SODIUM CHLORIDE 0.9% FLUSH: 10.0000 mL | INTRAVENOUS | Status: DC | PRN

## 2018-12-27 MED ORDER — NALOXONE HCL 2 MG/2ML IJ SOSY: 1.0000 mg | PREFILLED_SYRINGE | INTRAMUSCULAR | 0 refills | Status: AC | PRN

## 2018-12-27 NOTE — Plan of Care (Signed)
  Problem: Health Behavior/Discharge Planning: Goal: Ability to manage health-related needs will improve Outcome: Progressing   Problem: Clinical Measurements: Goal: Ability to maintain clinical measurements within normal limits will improve Outcome: Progressing Goal: Will remain free from infection Outcome: Progressing Goal: Cardiovascular complication will be avoided Outcome: Progressing   Problem: Activity: Goal: Risk for activity intolerance will decrease Outcome: Progressing   Problem: Nutrition: Goal: Adequate nutrition will be maintained Outcome: Progressing   Problem: Coping: Goal: Level of anxiety will decrease Outcome: Progressing   Problem: Elimination: Goal: Will not experience complications related to bowel motility Outcome: Progressing Goal: Will not experience complications related to urinary retention Outcome: Progressing   Problem: Pain Managment: Goal: General experience of comfort will improve Outcome: Progressing   Problem: Skin Integrity: Goal: Risk for impaired skin integrity will decrease Outcome: Progressing

## 2018-12-27 NOTE — Significant Event (Signed)
Long discussion with patient after discussing with both infectious disease and cardiology specialist-in addition I left a long detailed voicemail for his wife explaining the rationale of leaving the pacemaker and because this is needed and treating empirically with 6-week duration of antibiotics for his presumed endocarditis until 02/11/2019 He will probably be discharged tomorrow with home health and his wife has been made aware I would suggest outpatient goals of care given his thrombocytopenia newly diagnosed liver cancer and multiple significant comorbidities that decrease his ECOG score

## 2018-12-27 NOTE — TOC Progression Note (Signed)
Transition of Care [facility]) - Progression Note    Patient Details  Name: ERVEN RAMSON MRN: [id number] Date of Birth: [birth year]  Transition of Care [facility]) CM/SW Contact  Roshaun Pound, Juliann Pulse, RN Phone Number: 12/27/2018, 1:48 PM  Clinical Narrative: Noted no d/c today. Continue to monitor for d/c plans. Both iv infusion [place] aware.      Expected Discharge Plan: [place] Barriers to Discharge: No Barriers Identified  Expected Discharge Plan and Services Expected Discharge Plan: [place]   Discharge Planning Services: CM Consult Post Acute Care Choice: [place] arrangements for the past 2 months: Single Family Home Expected Discharge Date: 12/27/18                   HH Arranged: RN, PT, OT, Social Work, Nurse's Aide, IV Antibiotics HH Agency: Tour manager, [place]   Social Determinants of Health (SDOH) Interventions    Readmission Risk Interventions Readmission Risk Prevention Plan 12/12/2018  Transportation Screening Complete  Medication Review Press photographer) Complete  PCP or Specialist appointment within 3-5 days of discharge Complete  HRI or Home Care Consult Complete  SW Recovery Care/Counseling Consult Complete  Palliative Care Screening Complete  Skilled Nursing Facility Complete  Some recent data might be hidden

## 2018-12-27 NOTE — TOC Transition Note (Signed)
Transition of Care [facility]) - CM/SW Discharge Note   Patient Details  Name: Robert Gregory MRN: [id number] Date of Birth: [birth year]  Transition [facility] CM/SW Contact:  Dessa Phi, RN Phone Number: 12/27/2018, 11:25 AM   Clinical Narrative: Patient for picc today;[employer] rep Robert Gregory iv infusion already following for initial instruction for iv infusion;[facility] rep Robert Gregory following for ongoing iv abx HHRN for instruction,also HHPT/OT/aide/csw. PTAR for non emergency transp home. Spoke to spouse Robert Gregory per patient request-informed of d/c plan-spouse voiced understanding & in agreement. PTAR forms on shadow chart for CM/or Nsg to call when ready for d/c-contact spouse when calling PTAR-Robert Gregory [phone].     Final next level of care: [place] Barriers to Discharge: No Barriers Identified   Patient Goals and CMS Choice Patient states their goals for this hospitalization and ongoing recovery are:: (go home) CMS Medicare.gov Compare Post Acute Care list provided to:: Patient Represenative (must comment)(spouse-Robert Gregory) Choice offered to / list presented to : Spouse  Discharge Placement                       Discharge Plan and Services   Discharge Planning Services: CM Consult Post Acute Care Choice: Home Health              HH Arranged: RN, PT, OT, Social Work, Nurse's Aide, IV Antibiotics HH Agency: Tour manager, [place]   Social Determinants of Health (SDOH) Interventions     Readmission Risk Interventions Readmission Risk Prevention Plan 12/12/2018  Transportation Screening Complete  Medication Review Press photographer) Complete  PCP or Specialist appointment within 3-5 days of discharge Complete  HRI or Home Care Consult Complete  SW Recovery Care/Counseling Consult Complete  Palliative Care Screening Complete  Skilled Nursing Facility Complete  Some recent data might be hidden

## 2018-12-27 NOTE — Progress Notes (Signed)
Peripherally Inserted Central Catheter/Midline Placement  The IV Nurse has discussed with the patient and/or persons authorized to consent for the patient, the purpose of this procedure and the potential benefits and risks involved with this procedure.  The benefits include less needle sticks, lab draws from the catheter, and the patient may be discharged home with the catheter. Risks include, but not limited to, infection, bleeding, blood clot (thrombus formation), and puncture of an artery; nerve damage and irregular heartbeat and possibility to perform a PICC exchange if needed/ordered by physician.  Alternatives to this procedure were also discussed.  Bard Power PICC patient education guide, fact sheet on infection prevention and patient information card has been provided to patient /or left at bedside.    PICC/Midline Placement Documentation        Robert Gregory 12/27/2018, 1:57 PM

## 2018-12-27 NOTE — Progress Notes (Signed)
PHARMACY CONSULT NOTE FOR:  OUTPATIENT  PARENTERAL ANTIBIOTIC THERAPY (OPAT)  Indication: enterococcus endocarditis Regimen: ampicillin 12g/24h continuous infusion and ceftriaxone 2gm q12 End date: 02/01/2019  IV antibiotic discharge orders are pended. To discharging provider:  please sign these orders via discharge navigator,  Select New Orders & click on the button choice - Manage This Unsigned Work.     Thank you for allowing pharmacy to be a part of this patient's care.  Doreene Eland, PharmD, BCPS.   Work Cell: [phone] 12/27/2018 2:14 PM

## 2018-12-27 NOTE — Discharge Summary (Addendum)
Physician Discharge Summary  Robert Gregory TIW:[id number] DOB: 05-10-1941 DOA: 12/03/2018  PCP: Seward Carol, MD  Admit date: 12/03/2018 Discharge date: 12/27/2018  Time spent: 60 minutes  Recommendations for Outpatient Follow-up:  1. Antibiotics as per ID till 5/14 2. Recommend Hospice and GOC as OP should he decline further-see my note  Discharge Diagnoses:  Principal Problem:   Near syncope Active Problems:   Essential hypertension   Hyperlipidemia   Type 2 diabetes mellitus with stage 3 chronic kidney disease (HCC)   Chronic back pain   Obesity, Class III, BMI 40-49.9 (morbid obesity) (HCC)   Cellulitis of right lower extremity   Complete heart block (HCC)   Acute renal failure superimposed on stage 3 chronic kidney disease ([place])   Hepatocellular carcinoma ([place])   Discharge Condition: gaurded  Diet recommendation: none  Filed Weights   12/08/18 0509 12/09/18 0600 12/21/18 1300  Weight: (!) 143.2 kg (!) 146.3 kg (!) 137.5 kg    History of present illness:  60 CM CAD s/p CABG 1997 sys HF with improvement to EF 55-60% 09/08/2018--baseline class ii symptoms Afib [place] > 4 on Xarelto-s/p st jude PPM 2015 stg iii ckd OSA [place] def anemia Dm ty ii with neuropathy  He was hosptalized in Dec 2019 for Malaise and Cellulitis He came to the ED 2/25 for syncope and PPm was interrogated and was sent home  In the interim has been diagnosed with [place] 8 x 6 cm mass  Came to Ed finally 3.16 with CHB and PPM interrogated by Dr. Klein-Cardiology following   Hospital Course:   Patient had an extensive hospital stay  He was initially admitted with potentially recurrent right lower extremity cellulitis and was treated initially with Vanco and Zosyn.  For his T7 back fracture-he was very high risk for kyphoplasty given his recurrent cellulitis and he was placed on TLSO brace-eventually radiation oncology saw the patient and he had fractionated treatment of the same with  XRT.  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  ---->>> as such his recently placed Port-A-Cath had to be removed and a PICC line was placed on day of discharge to allow for ceftriaxone and Ampicillin completing 6 weeks of antibiotics and this will have to be pulled on follow-up at PCPs office.  He had a TEE on 4/7 which was EQUIVOCAL for vegetation/sterile endocarditis-- felt by cardiology to be risky. He CANNOT HAVE PPM explanted  Thrombus material and was nonspecific and would not change planning anyway with regards to 3 weeks of antibiotics  I had extensive discussions with Zayvian Mcmurtry [phone] and explained this. Dr. Delfina Redwood is aware of these underlying issues per Mrs Robert Gregory and shares with me they do not know if his cancer is curable--patient and wife understand the difference between treatable and curable She mentioned to me that  If her Husband declines physically, Dr. Delfina Redwood will be bale to coordinate if EOL care. His Metastatic T7 cancer will probably not be treatable and this is in keeping with his advanced directives--Dr. Polite once again is aware and Wife has been in communication with him   Consultants:   Infectious disease  Oncology  Cardiology  Procedures:   Pacemaker transition from VVI to DDD pacing on 12/03/2018  Esophagram 12/05/2018 nonspecific esophageal motility disorder  Antimicrobials:  See above  Discharge Exam: Vitals:   12/26/18 2050 12/27/18 0450  BP: 133/75 129/78  Pulse: 85 80  Resp: 18 20  Temp: 98.1 F (36.7 C) 97.7 F (36.5 C)  SpO2: 100% 94%    General: eomi ncat alert oriented in no distress--not cofused Cardiovascular: s1 s2 no m/r/g Respiratory: clear no added sound abd soft nt nd  Discharge Instructions   Discharge Instructions    Diet - low sodium heart healthy    Complete by:  As directed    Discharge instructions   Complete by:  As directed    You will need to continue your pain medications and treatment We will prescribe for you antibiotics IV until 24 April to complete 3 weeks of treatment for bacteria in your blood-your PICC line will have to be removed at the end of therapy You will need lab work that should be organized subsequent to treatment of the same and this should be reported to [facility] Dr. Linus Salmons   Increase activity slowly   Complete by:  As directed      Allergies as of 12/28/2018      Reactions   Feraheme [ferumoxytol] Other (See Comments)   Patient had  reaction during infusion in Feb 2020 decision made to change to [facility] (passed out)   [employer] [iodinated Diagnostic Agents] Rash, Other (See Comments)   Extreme flushing   Iodine Other (See Comments)   flushing   Ioxaglate Other (See Comments)   Extreme flushing   Adhesive [tape] Rash   Adhesive tapes causes redness/itching/rash after removal   Seasonal Ic [cholestatin] Other (See Comments)   Unknown reaction      Medication List    STOP taking these medications   Benicar 40 MG tablet Generic drug:  olmesartan   carvedilol 6.25 MG tablet Commonly known as:  COREG   furosemide 40 MG tablet Commonly known as:  LASIX   pravastatin 80 MG tablet Commonly known as:  PRAVACHOL   spironolactone 25 MG tablet Commonly known as:  ALDACTONE   Tricor 48 MG tablet Generic drug:  fenofibrate   Vitamin D 50 MCG (2000 UT) tablet   Xarelto 20 MG Tabs tablet Generic drug:  rivaroxaban     TAKE these medications   acetaminophen 500 MG tablet Commonly known as:  TYLENOL Take 1,000 mg by mouth every 6 (six) hours as needed for headache (pain).   ampicillin  IVPB Inject 12 g into the vein continuous. Indication:  Enterococcus endocarditis Last Day of Therapy:  02/01/2019 Labs - Once weekly:  CBC/D and CMP   BD Insulin Syringe U-500 31G X 6MM 0.5 ML Misc Generic  drug:  Insulin Syringe/Needle U-500 USE 1 SYRINGE TO INJECT UNDER THE SKIN THREE TIMES A DAY BEFORE MEALS What changed:  See the new instructions.   calcitonin (salmon) 200 UNIT/ACT nasal spray Commonly known as:  MIACALCIN/FORTICAL Place 1 spray into alternate nostrils daily.   cefTRIAXone  IVPB Commonly known as:  ROCEPHIN Inject 2 g into the vein every 12 (twelve) hours. Indication:  Enterococcus endocarditis Last Day of Therapy:  02/01/2019 Labs - Once weekly:  CBC/D and CMP   Coenzyme Q10 200 MG capsule Take 200 mg by mouth daily with supper.   ezetimibe 10 MG tablet Commonly known as:  ZETIA TAKE 1 TABLET DAILY   feeding supplement (GLUCERNA SHAKE) Liqd Take 237 mLs by mouth 3 (three) times daily between meals.   fentaNYL 25 MCG/HR Commonly known as:  [place] 1 patch onto the skin every 3 (three) days.   gabapentin 400 MG  capsule Commonly known as:  NEURONTIN Take 1 capsule (400 mg total) by mouth 3 (three) times daily.   IBgard 90 MG Cpcr Generic drug:  Peppermint Oil Take 90 mg by mouth 2 (two) times daily.   insulin regular human CONCENTRATED 500 UNIT/ML injection Commonly known as:  HUMULIN R Inject up to 200 units a day as advised What changed:    how much to take  how to take this  when to take this  additional instructions   lactulose 10 GM/15ML solution Commonly known as:  CHRONULAC Take 15 mLs (10 g total) by mouth 2 (two) times daily.   lidocaine 5 % Commonly known as:  LIDODERM Place 1 patch onto the skin daily. Remove & Discard patch within 12 hours or as directed by MD   metFORMIN 500 MG 24 hr tablet Commonly known as:  GLUCOPHAGE-XR Take 1000 mg with lunch What changed:    how much to take  how to take this  when to take this  additional instructions   methocarbamol 750 MG tablet Commonly known as:  ROBAXIN Take 375 mg by mouth every 8 (eight) hours as needed for muscle spasms (back pain).   METHYLFOLATE PO Take  2,000 mcg by mouth daily with supper.   mouth rinse Liqd solution 15 mLs by Mouth Rinse route 2 (two) times daily.   multivitamin with minerals Tabs tablet Take 1 tablet by mouth daily. Centrum Silver   naloxone 2 MG/2ML injection Commonly known as:  NARCAN Inject 1 mL (1 mg total) into the vein as needed.   nitroGLYCERIN 0.4 MG SL tablet Commonly known as:  NITROSTAT Place 1 tablet (0.4 mg total) under the tongue every 5 (five) minutes as needed for chest pain.   omega-3 acid ethyl esters 1 g capsule Commonly known as:  LOVAZA TAKE 2 CAPSULES TWICE A DAY What changed:    how much to take  how to take this  when to take this  additional instructions   oxyCODONE-acetaminophen 7.5-325 MG tablet Commonly known as:  PERCOCET Take 1 tablet by mouth every 8 (eight) hours as needed for severe pain.   potassium chloride 10 MEQ tablet Commonly known as:  K-DUR Take 10 mEq by mouth daily with breakfast.   PROBIOTIC DAILY PO Take 1 tablet by mouth 2 (two) times daily.   rifampin 300 MG capsule Commonly known as:  RIFADIN Take 1 capsule (300 mg total) by mouth every 12 (twelve) hours.   senna-docusate 8.6-50 MG tablet Commonly known as:  Senokot-S Take 2 tablets by mouth 2 (two) times daily.   traMADol 50 MG tablet Commonly known as:  ULTRAM Take 0.5-1 tablets (25-50 mg total) by mouth every 6 (six) hours as needed for moderate pain (give 29m for pain 4-7/10 and 531mfor pain >7/10).            Home Infusion Instuctions  (From admission, onward)         Start     Ordered   12/27/18 0000  Home infusion instructions [facility] May follow [place] Protocol; May administer Cathflo as needed to maintain patency of vascular access device.; Flushing of vascular access device: [facility]: 0.9% NaCl pre/post medica...    Question Answer Comment  Instructions May follow ACEscalonosing Protocol   Instructions May administer Cathflo as needed  to maintain patency of vascular access device.   Instructions Flushing of vascular access device: per [facility]: 0.9% NaCl pre/post medication administration  and prn patency; Heparin 100 u/ml, 41m for implanted ports and Heparin 10u/ml, 58mfor all other central venous catheters.   Instructions May follow [facility] Anaphylaxis Protocol for First Dose Administration in the home: 0.9% NaCl at 25-50 ml/hr to maintain IV access for protocol meds. Epinephrine 0.3 ml IV/IM PRN and Benadryl 25-50 IV/IM PRN s/s of anaphylaxis.   Instructions [facility] Infusion Coordinator (RN) to assist per patient IV care needs in the home PRN.      12/27/18 1405         Allergies  Allergen Reactions  . Feraheme [Ferumoxytol] Other (See Comments)    Patient had  reaction during infusion in Feb 2020 decision made [facility]  . Contrast Media [Iodinated Diagnostic Agents] Rash and Other (See Comments)    Extreme flushing  . Iodine Other (See Comments)    flushing  . Ioxaglate Other (See Comments)    Extreme flushing  . Adhesive [Tape] Rash    Adhesive tapes causes redness/itching/rash after removal  . Seasonal Ic [Cholestatin] Other (See Comments)    Unknown reaction   Follow-up Information    [facility] Follow up.            The results of significant diagnostics from this hospitalization (including imaging, microbiology, ancillary and laboratory) are listed below for reference.    Significant Diagnostic Studies: Ct Head Wo Contrast  Result Date: 12/17/2018 CLINICAL DATA:  Syncope EXAM: CT HEAD WITHOUT CONTRAST TECHNIQUE: Contiguous axial images were obtained from the base of the skull through the vertex without intravenous contrast. COMPARISON:  11/14/2018 FINDINGS: Brain: No evidence of acute infarction, hemorrhage, hydrocephalus, extra-axial collection or mass lesion/mass effect. Mild cortical atrophy. Mild subcortical white matter and periventricular small vessel  ischemic changes. Vascular: Intracranial atherosclerosis. Skull: Normal. Negative for fracture or focal lesion. Sinuses/Orbits: The visualized paranasal sinuses are essentially clear. The mastoid air cells are unopacified. Other: None. IMPRESSION: No evidence of acute intracranial abnormality. Atrophy with small vessel ischemic changes. Electronically Signed   By: SrJulian Hy.D.   On: 12/17/2018 05:32   Ct Thoracic Spine Wo Contrast  Result Date: 12/06/2018 CLINICAL DATA:  T7 compression fracture.  Hepatocellular carcinoma. EXAM: CT THORACIC SPINE WITHOUT CONTRAST TECHNIQUE: Multidetector CT images of the thoracic were obtained using the standard protocol without intravenous contrast. COMPARISON:  [facility] FINDINGS: Vertebrae: There is tumor destroying the T7 and T8 vertebral bodies. There is a pathologic fracture of the T7 vertebral body extending into the posterior margin of T7 with new slight protrusion of the posterior margin of T7 into the spinal canal since the prior exam. There is also paraspinal tumor around T7 and T8. There is no pathologic fracture of T8 at this time. There is no discrete tumor within the spinal canal although the lack of contrast limits definition of the interface between the soft tissues, bone, and thecal sac. There is a new tiny left pleural effusion and a progressed small right pleural effusion since the prior study. There is an 11 mm precarinal lymph node and a 11 mm left periaortic lymph node both best seen on image 73 of series 4. These appear larger than on the prior exam. 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 No acute abnormality of the remainder of the thoracic spine. Anterior osteophytes fuse multiple levels. There is degenerative disc disease in the lower cervical spine with  a soft disc protrusions central and to the right at C5-6 with some compression of  the thecal sac. Aortic atherosclerosis. IMPRESSION: 1. Progression of the destruction and pathologic fracture of T7 since the prior CT scan of 11/27/2018. The partially destroyed posterior margin of T7-8 protrudes slightly into the spinal canal. 2. Unchanged tumor infiltration of the majority of the T8 vertebral body without a pathologic fracture. 3. Progressive paraspinal tumor at T7 and T8. 4. Progressive small right pleural effusion. New small left pleural effusion. 5. Enlarging mediastinal lymph nodes. 6. No definitive tumor within the spinal canal although sensitivity is limited due to the lack of contrast. Electronically Signed   By: Lorriane Shire M.D.   On: 12/06/2018 13:22   US Abdomen Limited  Result Date: 12/26/2018 CLINICAL DATA:  Abdominal distension. EXAM: LIMITED ABDOMEN ULTRASOUND FOR ASCITES TECHNIQUE: Limited ultrasound survey for ascites was performed in all four abdominal quadrants. COMPARISON:  Right upper quadrant ultrasound 12/21/2018 FINDINGS: Moderate volume of ascites in all 4 quadrants, similar to slightly increased from prior ultrasound. IMPRESSION: Moderate volume intra-abdominal ascites. Electronically Signed   By: Keith Rake M.D.   On: 12/26/2018 19:10   Ir Removal [employer] Access W/ Port W/o Fl Mod Sed  Result Date: 12/26/2018 CLINICAL DATA:  Bacteremia and need for removal of port placed on 12/19/2018 due to concern for seeding of port catheter and presence of indwelling pacemaker. EXAM: REMOVAL OF IMPLANTED TUNNELED PORT-A-CATH MEDICATIONS: 2 grams IV Ancef. PROCEDURE: The right chest Port-A-Cath site was prepped with chlorhexidine. A sterile gown and gloves were worn during the procedure. Local anesthesia was provided with 1% lidocaine. An incision was made overlying the Port-A-Cath with a #15 scalpel. Utilizing sharp and blunt dissection, the Port-A-Cath was removed. Portable cautery was utilized. Retention sutures were removed. The pocked was irrigated with sterile saline.  Wound closure was performed with subcutaneous 3-0 Monocryl, subcuticular 4-0 Vicryl and Dermabond. FINDINGS: The port pocket was not overtly infected and therefore the pocket was closed. The entire Port-A-Cath was removed successfully. IMPRESSION: Removal of implanted Port-A-Cath utilizing sharp and blunt dissection. The procedure was uncomplicated. Electronically Signed   By: Aletta Edouard M.D.   On: 12/26/2018 08:10   Ct Abdomen W Wo Contrast  Result Date: 12/01/2018 CLINICAL DATA:  Cirrhosis. Indeterminate echogenic focus in right liver on recent ultrasound. EXAM: CT ABDOMEN WITHOUT AND WITH CONTRAST TECHNIQUE: Multidetector CT imaging of the abdomen was performed following the standard protocol before and following the bolus administration of intravenous contrast. CONTRAST:  175m OMNIPAQUE IOHEXOL 300 MG/ML  SOLN COMPARISON:  05/26/2018 CT abdomen. FINDINGS: Lower chest: No significant pulmonary nodules or acute consolidative airspace disease. Punctate right lung base calcified granuloma. ICD leads are seen terminating in the right atrium and right ventricle. Coronary atherosclerosis. Newly mildly enlarged 1.1 cm right pericardiophrenic node (series 9/image 21). Hepatobiliary: Diffusely irregular liver surface, compatible with hepatic cirrhosis. There is an 8.0 x 6.0 cm right liver dome mass (series 5/image 14) with mild arterial phase hyperenhancement, no clear washout or enhancing capsule, not well visualized on the 05/26/2018 CT, favored to be increased from 5.3 x 4.5 cm, compatible with LI-RADS category 5. surrounding heterogeneous enhancement and scattered subcentimeter nodules in the superior right liver lobe, not appreciably changed. Cholelithiasis. No gallbladder wall thickening. No biliary ductal dilatation. Pancreas: Normal, with no mass or duct dilation. Spleen: Moderate splenomegaly (craniocaudal splenic length 17.0 cm). No splenic mass. Adrenals/Urinary Tract: Left adrenal 3.5 cm nodule with  precontrast density 41 HU, new since  05/26/2018 CT. No right adrenal nodule. Nonobstructing 5 mm lower left renal stones. Borderline mild bilateral hydronephrosis, new. Stomach/Bowel: Normal non-distended stomach. Visualized small and large bowel is normal caliber, with no bowel wall thickening. Vascular/Lymphatic: Atherosclerotic nonaneurysmal abdominal aorta. Patent portal, splenic, hepatic and renal veins. No pathologically enlarged lymph nodes in the abdomen. Other: No pneumoperitoneum, ascites or focal fluid collection. Musculoskeletal: No aggressive appearing focal osseous lesions. Marked thoracolumbar spondylosis. IMPRESSION: 1. Cirrhosis. Right liver dome 8.0 cm mass, LI-RADS category 5 (definitely hepatocellular carcinoma). 2. New 3.5 cm left adrenal nodule, compatible with left adrenal metastasis. 3. New right pericardiophrenic adenopathy suggesting nodal metastasis. 4. Moderate splenomegaly. 5. Borderline mild bilateral hydronephrosis is new, recommend correlation with serum creatinine. Nonobstructing left nephrolithiasis. 6. Cholelithiasis. 7.  Aortic Atherosclerosis (ICD10-I70.0). These results will be called to the ordering clinician or representative by the Radiologist Assistant, and communication documented in the PACS or zVision Dashboard. Electronically Signed   By: Ilona Sorrel M.D.   On: 12/01/2018 17:55   Dg Chest Port 1 View  Result Date: 12/22/2018 CLINICAL DATA:  Hypoxia. EXAM: PORTABLE CHEST 1 VIEW COMPARISON:  12/21/2018 FINDINGS: Pacemaker and right-sided power port remains in place. Heart size is within normal limits allowing for portable technique and low lung volumes. Prior CABG. Both lungs are grossly clear. No evidence of pleural effusion. IMPRESSION: Low lung volumes.  No acute findings. Electronically Signed   By: Earle Gell M.D.   On: 12/22/2018 08:17   Dg Chest Port 1 View  Result Date: 12/21/2018 CLINICAL DATA:  [place] EXAM: PORTABLE CHEST 1 VIEW COMPARISON:  12/15/2018;  12/03/2018 FINDINGS: Grossly unchanged cardiac silhouette and mediastinal contours post median sternotomy and CABG. Stable position of support apparatus. Lung volumes remain reduced. Pulmonary vasculature remains indistinct with cephalization of flow. Bibasilar heterogeneous opacities, left greater than right, similar to the 3/27 examination. No new focal airspace opacities. No pleural effusion or pneumothorax. No acute osseous abnormalities. IMPRESSION: Similar findings of lung hypoventilation and suspected pulmonary edema with associated bibasilar opacities, atelectasis versus infiltrate. Further evaluation with a PA and lateral chest radiograph may be obtained as clinically indicated. Electronically Signed   By: Sandi Mariscal M.D.   On: 12/21/2018 08:13   Dg Chest Port 1 View  Result Date: 12/15/2018 CLINICAL DATA:  New onset shortness of breath, history type II diabetes mellitus, hypertension, atrial fibrillation, coronary artery disease, ischemic cardiomyopathy post MI EXAM: PORTABLE CHEST 1 VIEW COMPARISON:  Portable exam 0901 hours compared to 12/03/2018 FINDINGS: Left subclavian sequential transvenous ICD leads project at right atrium and right ventricle. Enlargement of cardiac silhouette post CABG. Pulmonary vascular congestion. Survey aorta BILATERAL interstitial infiltrates favoring pulmonary edema. Associated bibasilar atelectasis. No pleural effusion or pneumothorax. Osseous demineralization with chronic RIGHT rotator cuff tear. IMPRESSION: Enlargement of cardiac silhouette post CABG and ICD with pulmonary vascular congestion and probable mild pulmonary edema. Bibasilar atelectasis. Electronically Signed   By: Lavonia Dana M.D.   On: 12/15/2018 09:44   Dg Chest Port 1 View  Result Date: 12/03/2018 CLINICAL DATA:  Near syncope. EXAM: PORTABLE CHEST 1 VIEW COMPARISON:  September 07, 2018 FINDINGS: Stable AICD device. Cardiomegaly, unchanged. The hila, mediastinum, lungs, and pleura are unremarkable. A  transcutaneous pacer lead overlies the upper medial left chest. IMPRESSION: No acute interval change. Electronically Signed   By: Dorise Bullion III M.D   On: 12/03/2018 14:51   Ir Imaging Guided Port Insertion  Result Date: 12/19/2018 CLINICAL DATA:  Metastatic hepatocellular carcinoma and need for porta cath  for further therapy. EXAM: IMPLANTED PORT A CATH PLACEMENT WITH ULTRASOUND AND FLUOROSCOPIC GUIDANCE ANESTHESIA/SEDATION: 2.0 mg IV Versed; 100 mcg IV Fentanyl Total Moderate Sedation Time:  41 minutes The patient's level of consciousness and physiologic status were continuously monitored during the procedure by Radiology nursing. Additional Medications: 2 g IV Ancef. FLUOROSCOPY TIME:  42 seconds.  42.0 mGy. PROCEDURE: The procedure, risks, benefits, and alternatives were explained to the patient. Questions regarding the procedure were encouraged and answered. The patient understands and consents to the procedure. A time-out was performed prior to initiating the procedure. Ultrasound was utilized to confirm patency of the right internal jugular vein. The right neck and chest were prepped with chlorhexidine in a sterile fashion, and a sterile drape was applied covering the operative field. Maximum barrier sterile technique with sterile gowns and gloves were used for the procedure. Local anesthesia was provided with 1% lidocaine. After creating a small venotomy incision, a 21 gauge needle was advanced into the right internal jugular vein under direct, real-time ultrasound guidance. Ultrasound image documentation was performed. After securing guidewire access, an 8 Fr dilator was placed. A J-wire was kinked to measure appropriate catheter length. A subcutaneous port pocket was then created along the upper chest wall utilizing sharp and blunt dissection. Portable cautery was utilized. The pocket was irrigated with sterile saline. A single lumen power injectable port was chosen for placement. The 8 Fr  catheter was tunneled from the port pocket site to the venotomy incision. The port was placed in the pocket. External catheter was trimmed to appropriate length based on guidewire measurement. At the venotomy, an 8 Fr peel-away sheath was placed over a guidewire. The catheter was then placed through the sheath and the sheath removed. Final catheter positioning was confirmed and documented with a fluoroscopic spot image. The port was accessed with a needle and aspirated and flushed with heparinized saline. The access needle was removed. The venotomy and port pocket incisions were closed with subcutaneous 3-0 Monocryl and subcuticular 4-0 Vicryl. Dermabond was applied to both incisions. COMPLICATIONS: COMPLICATIONS None FINDINGS: After catheter placement, the tip lies at the cavo-atrial junction. The catheter aspirates normally and is ready for immediate use. IMPRESSION: Placement of single lumen port a cath via right internal jugular vein. The catheter tip lies at the cavo-atrial junction. A power injectable port a cath was placed and is ready for immediate use. Electronically Signed   By: Aletta Edouard M.D.   On: 12/19/2018 11:25   [place] Ekg Site Rite  Result Date: 12/27/2018 If Site Rite image not attached, placement could not be confirmed due to current cardiac rhythm.  US Abdomen Limited Ruq  Result Date: 12/21/2018 CLINICAL DATA:  Elevated liver function test. EXAM: ULTRASOUND ABDOMEN LIMITED RIGHT UPPER QUADRANT COMPARISON:  CT, 12/01/2018 FINDINGS: Gallbladder: Dependent gallstones. Wall is thickened and edematous measuring 9 mm. No sonographic Murphy's sign. Common bile duct: Diameter: 5-6 mm.  Not seen distally. Liver: Heterogeneous coarsened echotexture. Nodular contour. 2 discrete echogenic nodules noted, 3.6 x 3.1 x 3.2 cm in the right lobe 3.9 x 1.3 x 3.2 cm anterior, left lobe. These are not well-defined. Portal vein is patent but demonstrates to and fro blood flow. Small amount of ascites.  IMPRESSION: 1. Cholelithiasis and gallbladder wall thickening/edema. Gallbladder wall thickening is nonspecific in the setting of ascites. No convincing acute cholecystitis. No sonographic Murphy's sign. 2. Cirrhosis with liver masses. These masses are better defined on the recent CT scan, definite for hepatocellular carcinoma as reported on  the prior CT. 3. Small amount of ascites. Electronically Signed   By: Lajean Manes M.D.   On: 12/21/2018 16:53   Dg Esophagus W Single Cm (sol Or Thin Ba)  Result Date: 12/05/2018 CLINICAL DATA:  78 year old male with history of difficulty swallowing. Unable to belt. EXAM: ESOPHOGRAM/BARIUM SWALLOW TECHNIQUE: Single contrast examination was performed using  thin barium. FLUOROSCOPY TIME:  Fluoroscopy Time:  42 seconds Radiation Exposure Index (if provided by the fluoroscopic device): 9.9 mGy COMPARISON:  None. FINDINGS: Single contrast imaging of the esophagus was performed due to limited patient mobility. Within the limitations of the examination, the esophagus appears structurally normal, without definite esophageal mass, stricture or esophageal ring. No hiatal hernia. During real-time observation multiple single swallow attempts fail to fully propagate, and extensive tertiary contractions were observed throughout the examination. IMPRESSION: 1. Nonspecific esophageal motility disorder with extensive tertiary contractions. Electronically Signed   By: Vinnie Langton M.D.   On: 12/05/2018 14:14    Microbiology: Recent Results (from the past 240 hour(s))  Culture, blood (routine x 2)     Status: Abnormal   Collection Time: 12/21/18  7:50 AM  Result Value Ref Range Status   Specimen Description   Final    BLOOD LEFT ARM Performed at [facility] [address], [place], [place] [address]    Special Requests   Final    BOTTLES DRAWN AEROBIC AND ANAEROBIC Blood Culture adequate volume Performed at [facility]  [address] [place], [place] [address]    Culture  Setup Time   Final    GRAM POSITIVE COCCI IN BOTH AEROBIC AND ANAEROBIC BOTTLES CRITICAL VALUE NOTED.  VALUE IS CONSISTENT WITH PREVIOUSLY REPORTED AND CALLED VALUE.    Culture (A)  Final    ENTEROCOCCUS FAECALIS SUSCEPTIBILITIES PERFORMED ON PREVIOUS CULTURE WITHIN THE LAST 5 DAYS. Performed at [facility], [place] [address] [place], [place] [address]    Report Status 12/23/2018 FINAL  Final  Culture, blood (routine x 2)     Status: Abnormal   Collection Time: 12/21/18  7:51 AM  Result Value Ref Range Status   Specimen Description   Final    BLOOD RIGHT ARM Performed at [facility] [address] [place], [place] [address]    Special Requests   Final    BOTTLES DRAWN AEROBIC AND ANAEROBIC Blood Culture adequate volume Performed at [facility] [address], [place], [place] [address]    Culture  Setup Time   Final    IN BOTH AEROBIC AND ANAEROBIC BOTTLES GRAM POSITIVE COCCI CRITICAL RESULT CALLED TO, READ BACK BY AND VERIFIED WITH: B GREEN PHARMD 12/21/18 2337 JDW Performed at [facility], [place] [address], [place], [place] [address]    Culture ENTEROCOCCUS FAECALIS (A)  Final   Report Status 12/23/2018 FINAL  Final   Organism ID, Bacteria ENTEROCOCCUS FAECALIS  Final      Susceptibility   Enterococcus faecalis - MIC*    AMPICILLIN <=2 SENSITIVE Sensitive     VANCOMYCIN 1 SENSITIVE Sensitive     GENTAMICIN SYNERGY SENSITIVE Sensitive     * ENTEROCOCCUS FAECALIS  Blood Culture ID Panel (Reflexed)     Status: Abnormal   Collection Time: 12/21/18  7:51 AM  Result Value Ref Range Status   Enterococcus species DETECTED (A) NOT DETECTED Final    Comment: CRITICAL RESULT CALLED TO, READ BACK BY AND VERIFIED WITH: B GREEN PHARMD 12/21/18 2337 JDW  Vancomycin resistance NOT DETECTED NOT DETECTED Final   Listeria monocytogenes NOT DETECTED NOT DETECTED Final   Staphylococcus  species NOT DETECTED NOT DETECTED Final   Staphylococcus aureus (BCID) NOT DETECTED NOT DETECTED Final   Streptococcus species NOT DETECTED NOT DETECTED Final   Streptococcus agalactiae NOT DETECTED NOT DETECTED Final   Streptococcus pneumoniae NOT DETECTED NOT DETECTED Final   Streptococcus pyogenes NOT DETECTED NOT DETECTED Final   Acinetobacter baumannii NOT DETECTED NOT DETECTED Final   Enterobacteriaceae species NOT DETECTED NOT DETECTED Final   Enterobacter cloacae complex NOT DETECTED NOT DETECTED Final   Escherichia coli NOT DETECTED NOT DETECTED Final   Klebsiella oxytoca NOT DETECTED NOT DETECTED Final   Klebsiella pneumoniae NOT DETECTED NOT DETECTED Final   Proteus species NOT DETECTED NOT DETECTED Final   Serratia marcescens NOT DETECTED NOT DETECTED Final   Haemophilus influenzae NOT DETECTED NOT DETECTED Final   Neisseria meningitidis NOT DETECTED NOT DETECTED Final   Pseudomonas aeruginosa NOT DETECTED NOT DETECTED Final   Candida albicans NOT DETECTED NOT DETECTED Final   Candida glabrata NOT DETECTED NOT DETECTED Final   Candida krusei NOT DETECTED NOT DETECTED Final   Candida parapsilosis NOT DETECTED NOT DETECTED Final   Candida tropicalis NOT DETECTED NOT DETECTED Final    Comment: Performed at [facility], [address], [place], [place] [address]  Culture, Urine     Status: None   Collection Time: 12/21/18  9:59 AM  Result Value Ref Range Status   Specimen Description   Final    URINE, CLEAN CATCH Performed at [facility], [place] [address], [place], [place] [address]    Special Requests   Final    NONE Performed at [facility], [place] [address], [place], [place] [address]    Culture   Final    NO GROWTH Performed at [facility], [place] [address], [place], [place] [address]    Report Status 12/22/2018 FINAL  Final  MRSA PCR Screening     Status: None   Collection Time: 12/21/18  2:48 PM  Result Value Ref  Range Status   MRSA by PCR NEGATIVE NEGATIVE Final    Comment:        The GeneXpert MRSA Assay (FDA approved for NASAL specimens only), is one component of a comprehensive MRSA colonization surveillance program. It is not intended to diagnose MRSA infection nor to guide or monitor treatment for MRSA infections. Performed at [facility], [place] [address], [place], [place] [address]   Culture, blood (routine x 2)     Status: None   Collection Time: 12/22/18  9:03 AM  Result Value Ref Range Status   Specimen Description   Final    BLOOD RIGHT HAND Performed at [facility] [address] [place], [place] [address]    Special Requests   Final    BOTTLES DRAWN AEROBIC AND ANAEROBIC Blood Culture adequate volume Performed at [facility] [address], [place], [place] [address]    Culture   Final    NO GROWTH 5 DAYS Performed at [facility], [place] [address] [place], [place] [address]    Report Status 12/27/2018 FINAL  Final  Culture, blood (routine x 2)     Status: None   Collection Time: 12/22/18  9:06 AM  Result Value Ref Range Status   Specimen Description   Final    BLOOD LEFT ARM Performed at [facility]  [place] [address] [place], [place] [address]    Special Requests   Final    BOTTLES DRAWN AEROBIC AND ANAEROBIC Blood Culture adequate volume Performed at [facility] [address], [place], [place] [address]    Culture   Final    NO GROWTH 5 DAYS Performed at [facility], [place] [address], [place], [place] [address]    Report Status 12/27/2018 FINAL  Final     Labs: Basic Metabolic Panel: Recent Labs  Lab 12/22/18 0500 12/23/18 0432 12/24/18 0354 12/24/18 1728 12/25/18 0335 12/26/18 0501 12/26/18 0756 12/27/18 0519  NA 136 138  --   --  139  --  142 139  K 4.8 4.0  --   --  4.1  --  4.1 4.3  CL 102 102  --   --  104  --  106 105  CO2 24 26   --   --  29  --  26 26  GLUCOSE 210* 304*  --   --  138*  --  93 141*  BUN 44* 46*  --   --  36*  --  35* 27*  CREATININE 1.01 0.95 0.83  --  0.71 0.92 0.89 0.79  CALCIUM 8.0* 8.2*  --   --  8.6*  --  8.9 8.5*  MG  --   --   --  2.6* 2.5*  --   --   --    Liver Function Tests: Recent Labs  Lab 12/22/18 0500 12/23/18 0432 12/25/18 0335 12/26/18 0756 12/27/18 0519  AST 115* 83* 104* 124* 126*  ALT 160* 149* 172* 174* 158*  ALKPHOS 238* 228* 267* 299* 333*  BILITOT 2.4* 1.7* 1.7* 2.1* 2.1*  PROT 5.7* 6.0* 6.2* 6.6 6.1*  ALBUMIN 3.1* 3.1* 3.2* 3.4* 3.1*   No results for input(s): LIPASE, AMYLASE in the last 168 hours. Recent Labs  Lab 12/24/18 1728 12/25/18 0335  AMMONIA 53* 67*   CBC: Recent Labs  Lab 12/22/18 0500 12/23/18 0432 12/25/18 0335 12/26/18 0756 12/27/18 0519  WBC 7.8 5.5 5.1 6.6 6.2  NEUTROABS  --   --   --  4.9 4.7  HGB 11.3* 10.9* 11.6* 12.6* 12.1*  HCT 35.7* 34.5* 36.9* 40.0 38.9*  MCV 99.4 98.0 98.7 99.5 99.2  PLT 55* 52* 49* 52* 45*   Cardiac Enzymes: No results for input(s): CKTOTAL, CKMB, CKMBINDEX, TROPONINI in the last 168 hours. BNP: BNP (last 3 results) No results for input(s): BNP in the last 8760 hours.  ProBNP (last 3 results) No results for input(s): PROBNP in the last 8760 hours.  CBG: Recent Labs  Lab 12/26/18 1700 12/26/18 2139 12/26/18 2351 12/27/18 0554 12/27/18 0741  GLUCAP 154* 191* 188* 123* 126*       Signed:  Nita Sells MD   [facility] 12/27/2018, 11:13 AM

## 2018-12-27 NOTE — Progress Notes (Signed)
Humidifier refilled with S/W, pt. aware to notify if help needed.

## 2018-12-27 NOTE — Progress Notes (Signed)
Mr. Edman is in a better state of mind this morning.  He is looking forward to going home.  I really hope that he can go home today.  From my point of view, I do not see any other test that needs to be done.  He had the TEE yesterday.  This was okay.  There are no heart valve vegetations.  I still do not see any culture results on the Port-A-Cath tip.  I am not sure what the plans are for his outpatient antibiotics.  I would assume that he is going to be on IV antibiotics.  If so, he will need to have a PICC line placed.  His labs today show a white cell count 6.2.  Hemoglobin 12.1.  Platelet count 45,000.  His liver function studies are somewhat elevated.  SGPT is 158 SGOT 126.  This could be from his antibiotics.  He completed his radiation therapy to the thoracic spine.  I will make sure he is off Decadron if not already.  His back on his Xarelto.  He is doing well with the Xarelto.  Again, Mr. Hassell really, really, really wants to go home.  I do not see why he cannot go home.  Again, I do not know what the plans are for his outpatient antibiotics.  I will get him back to the office in about 10 days or so.  We will then plan for his systemic therapy with Tecentriq/Avastin.  His physical exam shows vital signs with a temperature 97.7.  Blood pressure 129/78.  Pulse is 80.  Oral exam shows no thrush.  Lungs are clear bilaterally.  Cardiac exam regular rate and rhythm with no murmurs, rubs or bruits.  Abdomen is obese.  Abdomen is soft.  Bowel sounds are present.  There is no guarding or rebound tenderness.  Extremities shows the wrap in the right lower leg.  There is no edema.  Neurological exam is nonfocal.  Mr. Corrales has been in the hospital for over 3 weeks.  Again, he has had quite a bit done.  He really is looking forward to going home.  Just being home where he is familiar and can be with his wife I think would do more for him than anything else.  He once again is incredibly effusive  regarding the staff on 4 E.  He really really appreciates their compassion and care.  I totally agree with this.  Lattie Haw, MD  [facility] 1:7

## 2018-12-27 NOTE — Progress Notes (Signed)
Occupational Therapy Treatment Patient Details Name: Robert Gregory MRN: [id number] DOB: 1941/03/10 Today's Date: 12/27/2018    History of present illness Pt is a 78 y/o male admitted 12/03/18 with dizziness, bradycardia and near syncope. Pt with complete heart block and metastatic hepatocellular carinoma with T7 compression fx. Not a candidate for verterbroplasty, to begin palliative radiation at [facility] 3/20.   4/2 in the am developed fever and hypotension-->Transferred to ICU, back to floor 4/3. PMH includes DM, MI, ICD, HTN, HLD, A-fib.   OT comments  Completed adl. Pt verbalizes understanding of AE but feels wife will assist at home. He has bathroom DME.  Reviewed back precautions and EC. Pt would needs continued practice with bed mobility  Follow Up Recommendations  Supervision/Assistance - 24 hour    Equipment Recommendations  (pt states he has 3:1 and shower chair)    Recommendations for Other Services      Precautions / Restrictions Precautions Precautions: Back;Fall Precaution Booklet Issued: No Precaution Comments: pt does not wish to use brace Restrictions Weight Bearing Restrictions: No       Mobility Bed Mobility     Rolling: Min assist Sidelying to sit: Min assist          Transfers   Equipment used: Rolling walker (2 wheeled)   Sit to Stand: Supervision              Balance                                           ADL either performed or assessed with clinical judgement   ADL           Upper Body Bathing: Set up;Sitting   Lower Body Bathing: Moderate assistance;Sit to/from stand   Upper Body Dressing : Minimal assistance(gown)       Toilet Transfer: Supervision/safety;Stand-pivot;RW   Toileting- Clothing Manipulation and Hygiene: Moderate assistance;Sit to/from stand;Cueing for sequencing;Cueing for safety         General ADL Comments: performed ADL from eob; pt's bed wet; catheter not working.   Transferred to recliner while NT arrived to change linen.  Re-educated on back precautions and energy conservation.  He is hopefully he will be able to d/c today (if PICC can be placed).  Pt states he has a BSC and shower seat at home. He was educated on AE but feels wife will assist him at home     Vision       Perception     Praxis      Cognition Arousal/Alertness: Awake/alert Behavior During Therapy: WFL for tasks assessed/performed Overall Cognitive Status: Within Functional Limits for tasks assessed                                          Exercises     Shoulder Instructions       General Comments      Pertinent Vitals/ Pain       Pain Assessment: No/denies pain  Home Living                                          Prior Functioning/Environment  Frequency           Progress Toward Goals  OT Goals(current goals can now be found in the care plan section)  Progress towards OT goals: Progressing toward goals  Acute Rehab OT Goals Time For Goal Achievement: 01/10/19 Potential to Achieve Goals: Good ADL Goals Pt Will Perform Grooming: (P) with supervision;standing Pt Will Perform Upper Body Dressing: (P) (d/c goal) Pt Will Perform Lower Body Dressing: (P) (d/c goal) Pt Will Transfer to Toilet: (P) with supervision;ambulating;bedside commode Pt Will Perform Toileting - Clothing Manipulation and hygiene: (P) with min assist;sit to/from stand  Plan      Co-evaluation                 AM-PAC OT "6 Clicks" Daily Activity     Outcome Measure   Help from another person eating meals?: None Help from another person taking care of personal grooming?: A Little Help from another person toileting, which includes using toliet, bedpan, or urinal?: A Little Help from another person bathing (including washing, rinsing, drying)?: A Little Help from another person to put on and taking off regular upper body  clothing?: A Little Help from another person to put on and taking off regular lower body clothing?: A Little 6 Click Score: 19    End of Session    OT Visit Diagnosis: Unsteadiness on feet (R26.81);Other abnormalities of gait and mobility (R26.89);Muscle weakness (generalized) (M62.81)   Activity Tolerance Patient tolerated treatment well;Other (comment)   Patient Left with call bell/phone within reach;in chair;with nursing/sitter in room   Nurse Communication          Time: [phone] OT Time Calculation (min): 27 min  Charges: OT General Charges $OT Visit: 1 Visit OT Treatments $Self Care/Home Management : 23-37 mins  Lesle Chris, OTR/L Acute Rehabilitation Services [phone] [place] pager [phone] office 12/27/2018   [place] 12/27/2018, 9:49 AM

## 2018-12-27 NOTE — Progress Notes (Signed)
[facility]   Reason for visit: Follow up on Enterococcal sepsis  Interval History: reviewed TEE with finding of PM wire vegetation with hsopitalist this AM; agreement that EP would assess the patient but only cardiologist has at this time; PICC being placed with initial attempt to evaluate patient (dual-lumen placed due to expanded ABX need); as of this time, EP evaluation has not occurred but d/c planning appears imminent      Physical Exam: Vitals:   12/27/18 0450 12/27/18 1322  BP: 129/78 123/66  Pulse: 80 90  Resp: 20 (!) 22  Temp: 97.7 F (36.5 C) 98.5 F (36.9 C)  SpO2: 94% 98%  Constitutional: patient appears in NAD, A & O x 3 Eyes: anicteric, PERRL, EOMI HENT: no thrush, MMM, fair dentition Respiratory: Normal respiratory effort; CTA B Cardiovascular: RRR, I/VI SEM at LLSB GI: soft, nt, obese, diffusely distended, +BS Extrems: RT distal leg wrapped with slight serous drainage but no erythema, 1+ non-pitting LE edema with venous stasis dermatitis noted  Review of Systems: All systems were reviewed and negative except as noted above.  Lab Results  Component Value Date   WBC 6.2 12/27/2018   HGB 12.1 (L) 12/27/2018   HCT 38.9 (L) 12/27/2018   MCV 99.2 12/27/2018   PLT 45 (L) 12/27/2018    Lab Results  Component Value Date   CREATININE 0.79 12/27/2018   BUN 27 (H) 12/27/2018   NA 139 12/27/2018   K 4.3 12/27/2018   CL 105 12/27/2018   CO2 26 12/27/2018    Lab Results  Component Value Date   ALT 158 (H) 12/27/2018   AST 126 (H) 12/27/2018   ALKPHOS 333 (H) 12/27/2018     Microbiology: Recent Results (from the past 240 hour(s))  Culture, blood (routine x 2)     Status: Abnormal   Collection Time: 12/21/18  7:50 AM  Result Value Ref Range Status   Specimen Description   Final    BLOOD LEFT ARM Performed at [facility], [place] [address] [place], [place] [address]    Special Requests   Final    BOTTLES  DRAWN AEROBIC AND ANAEROBIC Blood Culture adequate volume Performed at [place] [address], [place], [place] [address]    Culture  Setup Time   Final    GRAM POSITIVE COCCI IN BOTH AEROBIC AND ANAEROBIC BOTTLES CRITICAL VALUE NOTED.  VALUE IS CONSISTENT WITH PREVIOUSLY REPORTED AND CALLED VALUE.    Culture (A)  Final    ENTEROCOCCUS FAECALIS SUSCEPTIBILITIES PERFORMED ON PREVIOUS CULTURE WITHIN THE LAST 5 DAYS. Performed at [facility], [place] [address], [place], [place] [address]    Report Status 12/23/2018 FINAL  Final  Culture, blood (routine x 2)     Status: Abnormal   Collection Time: 12/21/18  7:51 AM  Result Value Ref Range Status   Specimen Description   Final    BLOOD RIGHT ARM Performed at [place] [address] [place], [place] [address]    Special Requests   Final    BOTTLES DRAWN AEROBIC AND ANAEROBIC Blood Culture adequate volume Performed at [place] [address] [place],  [address]    Culture  Setup Time   Final    IN BOTH AEROBIC AND ANAEROBIC BOTTLES GRAM POSITIVE COCCI CRITICAL RESULT CALLED TO, READ BACK BY AND VERIFIED WITH: B GREEN PHARMD 12/21/18 2337 JDW Performed at [facility],  [address], [place], [place] [address]    Culture ENTEROCOCCUS FAECALIS (A)  Final   Report Status 12/23/2018 FINAL  Final   Organism ID, Bacteria ENTEROCOCCUS FAECALIS  Final      Susceptibility   Enterococcus faecalis - MIC*    AMPICILLIN <=2 SENSITIVE Sensitive     VANCOMYCIN 1 SENSITIVE Sensitive     GENTAMICIN SYNERGY SENSITIVE Sensitive     * ENTEROCOCCUS FAECALIS  Blood Culture ID Panel (Reflexed)     Status: Abnormal   Collection Time: 12/21/18  7:51 AM  Result Value Ref Range Status   Enterococcus species DETECTED (A) NOT DETECTED Final    Comment: CRITICAL RESULT CALLED TO, READ BACK BY AND VERIFIED WITH: B GREEN PHARMD 12/21/18 2337 JDW    Vancomycin resistance  NOT DETECTED NOT DETECTED Final   Listeria monocytogenes NOT DETECTED NOT DETECTED Final   Staphylococcus species NOT DETECTED NOT DETECTED Final   Staphylococcus aureus (BCID) NOT DETECTED NOT DETECTED Final   Streptococcus species NOT DETECTED NOT DETECTED Final   Streptococcus agalactiae NOT DETECTED NOT DETECTED Final   Streptococcus pneumoniae NOT DETECTED NOT DETECTED Final   Streptococcus pyogenes NOT DETECTED NOT DETECTED Final   Acinetobacter baumannii NOT DETECTED NOT DETECTED Final   Enterobacteriaceae species NOT DETECTED NOT DETECTED Final   Enterobacter cloacae complex NOT DETECTED NOT DETECTED Final   Escherichia coli NOT DETECTED NOT DETECTED Final   Klebsiella oxytoca NOT DETECTED NOT DETECTED Final   Klebsiella pneumoniae NOT DETECTED NOT DETECTED Final   Proteus species NOT DETECTED NOT DETECTED Final   Serratia marcescens NOT DETECTED NOT DETECTED Final   Haemophilus influenzae NOT DETECTED NOT DETECTED Final   Neisseria meningitidis NOT DETECTED NOT DETECTED Final   Pseudomonas aeruginosa NOT DETECTED NOT DETECTED Final   Candida albicans NOT DETECTED NOT DETECTED Final   Candida glabrata NOT DETECTED NOT DETECTED Final   Candida krusei NOT DETECTED NOT DETECTED Final   Candida parapsilosis NOT DETECTED NOT DETECTED Final   Candida tropicalis NOT DETECTED NOT DETECTED Final    Comment: Performed at [facility], [place] [address] [place], [place] [address]  Culture, Urine     Status: None   Collection Time: 12/21/18  9:59 AM  Result Value Ref Range Status   Specimen Description   Final    URINE, CLEAN CATCH Performed at [facility], [place] [address] [place], [place] [address]    Special Requests   Final    NONE Performed at [facility], [place] [address] [place], [place] [address]    Culture   Final    NO GROWTH Performed at [facility], [place] [address] [place], [place] [address]    Report Status 12/22/2018  FINAL  Final  MRSA PCR Screening     Status: None   Collection Time: 12/21/18  2:48 PM  Result Value Ref Range Status   MRSA by PCR NEGATIVE NEGATIVE Final    Comment:        The GeneXpert MRSA Assay (FDA approved for NASAL specimens only), is one component of a comprehensive MRSA colonization surveillance program. It is not intended to diagnose MRSA infection nor to guide or monitor treatment for MRSA infections. Performed at [facility], [place] [address], [place], [place] [address]   Culture, blood (routine x 2)     Status: None   Collection Time: 12/22/18  9:03 AM  Result Value Ref Range Status   Specimen Description  Final    BLOOD RIGHT HAND Performed at [facility], [place] [address], [place], [place] [address]    Special Requests   Final    BOTTLES DRAWN AEROBIC AND ANAEROBIC Blood Culture adequate volume Performed at [place] [address] [place], [place] [address]    Culture   Final    NO GROWTH 5 DAYS Performed at [facility], [place] [address], [place], [place] [address]    Report Status 12/27/2018 FINAL  Final  Culture, blood (routine x 2)     Status: None   Collection Time: 12/22/18  9:06 AM  Result Value Ref Range Status   Specimen Description   Final    BLOOD LEFT ARM Performed at [facility] [address] [place], [place] [address]    Special Requests   Final    BOTTLES DRAWN AEROBIC AND ANAEROBIC Blood Culture adequate volume Performed at [place] [address], [place], [place] [address]    Culture   Final    NO GROWTH 5 DAYS Performed at [facility], [place] [address], [place], [place] [address]    Report Status 12/27/2018 FINAL  Final    Impression/Plan: The patient is a 78 y/o obese WM with DM and CRI, h/o heart block, s/p PM, NASH and recently diagnosed metastatic HCC, s/p portocath placement admitted with fever,  thrombocytopenia, and Enterococcal sepsis/PVIE.  1. Enterococcal sepsis - Both initial blood cxs were positive for pan-sensitive Enterococcus faecalis. Urine cx did not suggest this was source of his infection. Given the advanced stage of his HCC, worry for endogenous GI translocation as this would be most likely cause. This played a role in rationale to remove portocath (along with known data with pathogen adhesion to foreign devices). Repeat blood cxs are showing signs of clearance and TTE was unrevealing. Unfortunately, yesterday's TEE confirmed a vegetation along his ICD pacer wire, thus confirming prosthetic/device-related endocarditis. Repeat blood cxs remain negative, so PICC placed this afternoon. I cannot agree that evidence points to a possible sterile thrombus to his pacer wire in this setting as he clearly meets [facility] criteria for PVIE. Cardiologist has reviewed images and recommended medical treatment, but this would leave the patient only an ~30% likelihood of medical cure. His medical treatment options are rather limited as rifampin is contra-indicated due to his ongoing anticoagulation needs with xarelto (only SQ lovenox would allow rifampin use). This contra-indication is NOT a minor factor in his outcome now that [place] has been confirmed. Synergistic gentamicin would pose a significant risk for renal decline and extension of his hospitalization by 2 weeks as peaks and troughs cannot be safely drawn for monitoring as an OP. This leaves [place] with the option of using rocephin q 12 hrs (also concerning for development of AIN with dual beta-lactam treatment over 6 weeks now). Unfortunately, the latter is likely is "safest" option. While it is recognized, he has a h/o complete heart block, it would be advised that he have his ICD explanted in this setting from an ID standpoint. Unfortunately, there is no evidence to support prolonged lifelong ABX suppression in a patient following IV ABX treatment. Will  attempt to speak to [name], his OP electrophysiologist to discuss. While I have made arrangements for ongoing IV tx as an OP, please hold his hospital discharge until I can speak with his EP physician as it would be best advised to explore this option to avoid looming  medical toxicities and a poor outcome for the patient. Tentative ABX d/c date would be 02/01/2019 for IV ampicillin 2 gm q 4 hrs along with rocephin 2 gm IV q 12 hrs. > 40 minutes of time spent on tentative d/c planning alone today.  2. Fever - now resolved with targeted ABX tx. Repeat blood cxs x 2 for fever > 100.5.  3. [place] - Pt is s/p palliative XRT. Oncologist is eager to initiate immunotherapy soon for tx. Provided this tx would not result in neutropenia, it may be allowable for pt to begin chemo in next 10-14 days. Recommend close OP f/u with oncologist. Suspect transaminitis is due to HCC/tumor burden at this time.Unfortunately, this issue also requires close attempt to be paid to his LFTs as an OP given need for high-dose synergistic rocephin as noted above.

## 2018-12-27 NOTE — Progress Notes (Signed)
    Discussed with Dr. [name].  Results of TEE noted.  Question of small vegetation vs fibrin.  Patient with enterococcal bacteremia.  Antibiotic treatment options are difficult secondary to his underlying comorbid conditions.  He has had recent complete heart block so needs his back up pacing.  For now no suggestion to explant this device.  I sent a message to [name].  We will arrange follow up.    Discussed with the patient.

## 2018-12-27 NOTE — Progress Notes (Signed)
PROGRESS NOTE    Robert Gregory  AJG:[id number] DOB: 12/09/1940 DOA: 12/03/2018 PCP: Seward Carol, MD   Brief Narrative: 68 CM CAD s/p CABG 1997 sys HF with improvement to EF 55-60% 09/08/2018--baseline class ii symptoms Afib [place] > 4 on Xarelto-s/p st jude PPM 2015 stg iii ckd OSA [place] def anemia Dm ty ii with neuropathy  He was hosptalized in Dec 2019 for [facility] and Cellulitis He came to the ED 2/25 for syncope and PPm was interrogated and was sent home  In the interim has beendiagnosed with [place] 8 x 6 cm mass  Came to Ed finally 3.16 with CHB and PPM interrogated by Dr. Kleain-Cardiology following  Assessment & Plan:   Principal Problem:   Near syncope Active Problems:   Essential hypertension   Hyperlipidemia   Type 2 diabetes mellitus with stage 3 chronic kidney disease (HCC)   Chronic back pain   Obesity, Class III, BMI 40-49.9 (morbid obesity) ([place])   Cellulitis of right lower extremity   Complete heart block ([place])   Acute renal failure superimposed on stage 3 chronic kidney disease ([place])   Hepatocellular carcinoma ([place])   Severe sepsis Septic shock Concern for cult neg endocarditis Not present on admission. Secondary to enterococcus bacteremia.. He has had prolonged cellulitis which be a source. Associated hypotension that required a short course of vasopressors, IV fluids.  -Discontinue Solu-cortef; watch BP -Continue treatment for bacteremia -Id expertise appreciated--wait finalization of planning, TEE reviewed and high risk for endocarditis--See cardiologist note 4/8  Enterococcus bacteremia ID on board. Transthoracic Echocardiogram significant for no valve or pacer lead vegetations. Port removed on 4/6. Repeat blood cultures (4/3) no growth to date. Does not appear that a culture was obtained from port. -Transesophageal Echocardiogram as above  Elevated LFTs Trending back up. RUQ ultrasound significant for cholelithiasis with gallbladder wall  thickening/edema possibly related to liver process rather than gallbladder. Negative murphy sign. Continues to be asymptomatic. -No further work up--this is all cancer related imo.  Abdominal distension No pain. Bowel movement yesterday. Bowel sounds present.  Metastatic hepatocellular carcinoma T7 pathologic fracture Followed by oncology, [name]. Receiving radiation therapy. Port-A-Cath placement on 3/31. -Continue Tramadol, fentanyl patch, lidocaine patch, gabapentin -Radiation per radiation oncology  Acute metabolic vs toxic encephalopathy Secondary to medication vs sepsis vs combination per chart review. Resolved.  Confusion Likely hepatic encephalopathy. Mild. Improved. -Continue lactulose, will titrate for BM response  Acute on chronic diastolic heart failure Treated with IV lasix and has resolved. Patient takes lasix as an outpatient which has been held currently secondary to sepsis.  Diabetes mellitus, type 2 Patient is on metformin, U500 150 units at breakfast and 75 units at lunch. He reports hypoglycemia generally in the late afternoon/evening. -Continue carb modified diet -Continue Lantus 25 units twice per day, continue novolog 10 units TID with meals. -Continue SSI  Bilateral lower extremity cellulitis Per chart review, resolved. Resolved.  Dysphagia S/p sleep study on chart review. Nonspecific esophageal dysmotility. Carb/heart healthy diet  CAD Essential hypertension S/p CABG. On Coreg as an outpatient. Currently held secondary to recent hypotension  Hyperlipidemia -Continue pravastatin  Complete heart block Patient presented with near syncope. Cardiology consulted prior. Patient's pacemaker changed from VVI to DDD.    DVT prophylaxis: Xarelto Code Status:   Code Status: DNR Family Communication: None at bedside Disposition Plan: Home tomorrow --will need coordination of Abx as well as planning for Home infusions per ID which cannot be reasonably  coordinated today  Home with HHPT/OT  Consultants:   Infectious disease  Oncology  Cardiology  Procedures:   Pacemaker transition from VVI to DDD pacing on 12/03/2018  Esophagram 12/05/2018 nonspecific esophageal motility disorder  Antimicrobials:  P.o. cephalexin started 12/08/2018 to 12/12/2018  IV cefazolin 12/03/2020 to 12/08/2018  IV vancomycin and Zosyn 12/03/2018 to 12/04/2018  IV ampicillin started for 12/08/2018 ongoing    Subjective:  Well, no dsitress  No fever No chills Getting up oob No cp   Objective: Vitals:   12/26/18 1942 12/26/18 2050 12/27/18 0450 12/27/18 1322  BP:  133/75 129/78 123/66  Pulse: 81 85 80 90  Resp: 20 18 20  (!) 22  Temp:  98.1 F (36.7 C) 97.7 F (36.5 C) 98.5 F (36.9 C)  TempSrc:  Oral Oral   SpO2: 96% 100% 94% 98%  Weight:      Height:        Intake/Output Summary (Last 24 hours) at 12/27/2018 1349 Last data filed at 12/27/2018 1341 Gross per 24 hour  Intake 940 ml  Output 400 ml  Net 540 ml   Filed Weights   12/08/18 0509 12/09/18 0600 12/21/18 1300  Weight: (!) 143.2 kg (!) 146.3 kg (!) 137.5 kg    Examination:  General exam: obese pleasant in nad Respiratory system: Clear to auscultation. Respiratory effort normal. Cardiovascular system: S1 & S2 heard, RRR. No murmurs, rubs, gallops or clicks. Gastrointestinal system: Abdomen is distended, soft and nontender. No HSM, abd obese Central nervous system: Alert and oriented. No focal neurological deficits. Extremities: No edema. No calf tenderness Skin: No cyanosis. No rashes Psychiatry: Judgement and insight appear normal. Mood & affect appropriate.     Data Reviewed: I have personally reviewed following labs and imaging studies  CBC: Recent Labs  Lab 12/22/18 0500 12/23/18 0432 12/25/18 0335 12/26/18 0756 12/27/18 0519  WBC 7.8 5.5 5.1 6.6 6.2  NEUTROABS  --   --   --  4.9 4.7  HGB 11.3* 10.9* 11.6* 12.6* 12.1*  HCT 35.7* 34.5* 36.9* 40.0 38.9*  MCV  99.4 98.0 98.7 99.5 99.2  PLT 55* 52* 49* 52* 45*   Basic Metabolic Panel: Recent Labs  Lab 12/22/18 0500 12/23/18 0432 12/24/18 0354 12/24/18 1728 12/25/18 0335 12/26/18 0501 12/26/18 0756 12/27/18 0519  NA 136 138  --   --  139  --  142 139  K 4.8 4.0  --   --  4.1  --  4.1 4.3  CL 102 102  --   --  104  --  106 105  CO2 24 26  --   --  29  --  26 26  GLUCOSE 210* 304*  --   --  138*  --  93 141*  BUN 44* 46*  --   --  36*  --  35* 27*  CREATININE 1.01 0.95 0.83  --  0.71 0.92 0.89 0.79  CALCIUM 8.0* 8.2*  --   --  8.6*  --  8.9 8.5*  MG  --   --   --  2.6* 2.5*  --   --   --    GFR: Estimated Creatinine Clearance: 112.5 mL/min (by C-G formula based on SCr of 0.79 mg/dL). Liver Function Tests: Recent Labs  Lab 12/22/18 0500 12/23/18 0432 12/25/18 0335 12/26/18 0756 12/27/18 0519  AST 115* 83* 104* 124* 126*  ALT 160* 149* 172* 174* 158*  ALKPHOS 238* 228* 267* 299* 333*  BILITOT 2.4* 1.7* 1.7* 2.1* 2.1*  PROT 5.7* 6.0* 6.2* 6.6 6.1*  ALBUMIN 3.1* 3.1* 3.2* 3.4* 3.1*   No results for input(s): LIPASE, AMYLASE in the last 168 hours. Recent Labs  Lab 12/24/18 1728 12/25/18 0335  [place] 53* 67*   Coagulation Profile: Recent Labs  Lab 12/25/18 1649  INR 1.5*   Cardiac Enzymes: No results for input(s): CKTOTAL, CKMB, CKMBINDEX, TROPONINI in the last 168 hours. BNP (last 3 results) No results for input(s): PROBNP in the last 8760 hours. HbA1C: No results for input(s): HGBA1C in the last 72 hours. CBG: Recent Labs  Lab 12/26/18 2139 12/26/18 2351 12/27/18 0554 12/27/18 0741 12/27/18 1153  GLUCAP 191* 188* 123* 126* 195*   Lipid Profile: No results for input(s): CHOL, HDL, LDLCALC, TRIG, CHOLHDL, LDLDIRECT in the last 72 hours. Thyroid Function Tests: No results for input(s): TSH, T4TOTAL, FREET4, T3FREE, THYROIDAB in the last 72 hours. Anemia Panel: No results for input(s): VITAMINB12, FOLATE, FERRITIN, TIBC, IRON, RETICCTPCT in the last 72  hours. Sepsis Labs: Recent Labs  Lab 12/21/18 0750 12/22/18 0500 12/23/18 0432  PROCALCITON 0.20 0.26 0.14      Radiology Studies: US Abdomen Limited  Result Date: 12/26/2018 CLINICAL DATA:  Abdominal distension. EXAM: LIMITED ABDOMEN ULTRASOUND FOR ASCITES TECHNIQUE: Limited ultrasound survey for ascites was performed in all four abdominal quadrants. COMPARISON:  Right upper quadrant ultrasound 12/21/2018 FINDINGS: Moderate volume of ascites in all 4 quadrants, similar to slightly increased from prior ultrasound. IMPRESSION: Moderate volume intra-abdominal ascites. Electronically Signed   By: Keith Rake M.D.   On: 12/26/2018 19:10   Ir Removal [employer] Access W/ Port W/o Fl Mod Sed  Result Date: 12/26/2018 CLINICAL DATA:  Bacteremia and need for removal of port placed on 12/19/2018 due to concern for seeding of port catheter and presence of indwelling pacemaker. EXAM: REMOVAL OF IMPLANTED TUNNELED PORT-A-CATH MEDICATIONS: 2 grams IV Ancef. PROCEDURE: The right chest Port-A-Cath site was prepped with chlorhexidine. A sterile gown and gloves were worn during the procedure. Local anesthesia was provided with 1% lidocaine. An incision was made overlying the Port-A-Cath with a #15 scalpel. Utilizing sharp and blunt dissection, the Port-A-Cath was removed. Portable cautery was utilized. Retention sutures were removed. The pocked was irrigated with sterile saline. Wound closure was performed with subcutaneous 3-0 Monocryl, subcuticular 4-0 Vicryl and Dermabond. FINDINGS: The port pocket was not overtly infected and therefore the pocket was closed. The entire Port-A-Cath was removed successfully. IMPRESSION: Removal of implanted Port-A-Cath utilizing sharp and blunt dissection. The procedure was uncomplicated. Electronically Signed   By: Aletta Edouard M.D.   On: 12/26/2018 08:10   [place] Ekg Site Rite  Result Date: 12/27/2018 If Site Rite image not attached, placement could not be confirmed due to current  cardiac rhythm.   Scheduled Meds:  calcitonin (salmon)  1 spray Alternating Nares Daily   Chlorhexidine Gluconate Cloth  6 each Topical Daily   cholecalciferol  2,000 Units Oral Q supper   ezetimibe  10 mg Oral Daily   feeding supplement (GLUCERNA SHAKE)  237 mL Oral TID BM   feeding supplement (PRO-STAT SUGAR FREE 64)  30 mL Oral TID BM   fenofibrate  160 mg Oral Daily   fentaNYL  1 patch Transdermal Q72H   gabapentin  400 mg Oral TID   insulin aspart  0-5 Units Subcutaneous QHS   insulin aspart  0-9 Units Subcutaneous TID WC   insulin aspart  10 Units Subcutaneous TID WC   insulin glargine  25 Units Subcutaneous BID   lactulose  10 g Oral BID  lidocaine  1 patch Transdermal Q24H   mouth rinse  15 mL Mouth Rinse BID   multivitamin with minerals  1 tablet Oral Daily   nystatin  5 mL Oral 6 X Daily   omega-3 acid ethyl esters  2 g Oral BID   rivaroxaban  20 mg Oral Q supper   saccharomyces boulardii  250 mg Oral Daily   senna-docusate  2 tablet Oral BID   sodium bicarbonate/sodium chloride   Mouth Rinse QID   sodium chloride flush  10-40 mL Intracatheter Q12H   sodium chloride flush  3 mL Intravenous Q12H   Continuous Infusions:  ampicillin (OMNIPEN) IV 2 g (12/27/18 1220)   cefTRIAXone (ROCEPHIN)  IV       LOS: 24 days     Cordelia Poche, MD [facility] 12/27/2018, 1:49 PM  If 7PM-7AM, please contact night-coverage [email or web address]

## 2018-12-28 DIAGNOSIS — I330 Acute and subacute infective endocarditis: Secondary | ICD-10-CM

## 2018-12-28 DIAGNOSIS — T827XXA Infection and inflammatory reaction due to other cardiac and vascular devices, implants and grafts, initial encounter: Secondary | ICD-10-CM

## 2018-12-28 LAB — CBC WITH DIFFERENTIAL/PLATELET
Abs Immature Granulocytes: 0.06 10*3/uL (ref 0.00–0.07)
Basophils Absolute: 0 10*3/uL (ref 0.0–0.1)
Basophils Relative: 0 %
Eosinophils Absolute: 0.2 10*3/uL (ref 0.0–0.5)
Eosinophils Relative: 3 %
HCT: 37.4 % — ABNORMAL LOW (ref 39.0–52.0)
Hemoglobin: 11.8 g/dL — ABNORMAL LOW (ref 13.0–17.0)
Immature Granulocytes: 1 %
Lymphocytes Relative: 9 %
Lymphs Abs: 0.6 10*3/uL — ABNORMAL LOW (ref 0.7–4.0)
MCH: 31.4 pg (ref 26.0–34.0)
MCHC: 31.6 g/dL (ref 30.0–36.0)
MCV: 99.5 fL (ref 80.0–100.0)
Monocytes Absolute: 0.6 10*3/uL (ref 0.1–1.0)
Monocytes Relative: 10 %
Neutro Abs: 5 10*3/uL (ref 1.7–7.7)
Neutrophils Relative %: 77 %
Platelets: 45 10*3/uL — ABNORMAL LOW (ref 150–400)
RBC: 3.76 MIL/uL — ABNORMAL LOW (ref 4.22–5.81)
RDW: 19.9 % — ABNORMAL HIGH (ref 11.5–15.5)
WBC: 6.4 10*3/uL (ref 4.0–10.5)
nRBC: 0 % (ref 0.0–0.2)

## 2018-12-28 LAB — COMPREHENSIVE METABOLIC PANEL
ALT: 137 U/L — ABNORMAL HIGH (ref 0–44)
AST: 102 U/L — ABNORMAL HIGH (ref 15–41)
Albumin: 3.2 g/dL — ABNORMAL LOW (ref 3.5–5.0)
Alkaline Phosphatase: 361 U/L — ABNORMAL HIGH (ref 38–126)
Anion gap: 8 (ref 5–15)
BUN: 27 mg/dL — ABNORMAL HIGH (ref 8–23)
CO2: 27 mmol/L (ref 22–32)
Calcium: 8.5 mg/dL — ABNORMAL LOW (ref 8.9–10.3)
Chloride: 104 mmol/L (ref 98–111)
Creatinine, Ser: 0.8 mg/dL (ref 0.61–1.24)
GFR calc Af Amer: >60 mL/min (ref >60)
GFR calc non Af Amer: >60 mL/min (ref >60)
Glucose, Bld: 110 mg/dL — ABNORMAL HIGH (ref 70–99)
Potassium: 4.3 mmol/L (ref 3.5–5.1)
Sodium: 139 mmol/L (ref 135–145)
Total Bilirubin: 2.1 mg/dL — ABNORMAL HIGH (ref 0.3–1.2)
Total Protein: 6.5 g/dL (ref 6.5–8.1)

## 2018-12-28 LAB — GLUCOSE, CAPILLARY
Glucose-Capillary: 108 mg/dL — ABNORMAL HIGH (ref 70–99)
Glucose-Capillary: 155 mg/dL — ABNORMAL HIGH (ref 70–99)
Glucose-Capillary: 140 mg/dL — ABNORMAL HIGH (ref 70–99)

## 2018-12-28 MED ORDER — RIFAMPIN 300 MG PO CAPS: 300.0000 mg | ORAL_CAPSULE | Freq: Two times a day (BID) | ORAL | 0 refills | Status: AC

## 2018-12-28 MED ORDER — RIFAMPIN 300 MG PO CAPS
300.0000 mg | ORAL_CAPSULE | Freq: Two times a day (BID) | ORAL | Status: DC
  Administered 2018-12-28: 300 mg via ORAL
  Filled 2018-12-28 (×2): qty 1

## 2018-12-28 NOTE — Progress Notes (Addendum)
   Discussed today with [name] and Dr. [name].  Note from yesterday by ID reviewed.  There is no way of knowing whether this is an infected device or not.  [name] did offer to remove the device.  However, it was agreed that given the severity of his underlying condition that it was the lowest risk strategy at this point to try a course of antibiotic therapy with surveillance blood cultures.  During this time he will be back to see oncology for further evaluation and management of his metastatic hepatocellular cancer.  There will also like be further clarity around goals of care at that point.    In addition I reviewed his anticoagulation.  He was taken off of Xarelto in anticipation of a possible spinal procedure which he did not have.  He will remain off of anitcoagulation because the DOACs interact with rifampin.   Discussed with the patient.

## 2018-12-28 NOTE — Progress Notes (Signed)
Unfortunately, Robert Gregory is still in the hospital.  I guess the real issue now is the presumption that he has endocarditis.  He has had pacemaker in.  The pacemaker has been in for over 10 years.  Is clearly possible that what is being seen is a fibrin sheath.  However, I understand the issues that cardiology has.  I understand that if he does have bacteria on the pacemaker, and this is not treated, then he will have real trouble.  He has a PICC line in.  I suspect he will be on antibiotics for 6 weeks.  It sounds like he will go home today.  He is excited about this.  His labs look relatively stable.  His platelet count is 45,000.  Hemoglobin 11.8.  His liver function studies shows bilirubin to be 2.1.  SGPT 137 SGOT 102.  Alkaline phosphatase is 361.  I would think that these might be elevated from his antibiotics.  I do not think that the antibiotics will affect [place] trying to treat him for the liver cancer.  Again, he will be going home today.  This will be a big relief for him.  I will follow-up in the office in about 10 days or so.  We will plan for treatment to begin.  Robert Haw, MD  [place] 4:13

## 2018-12-28 NOTE — Progress Notes (Signed)
OT Note:  checked in with pt. He hopes to d/c today.  He feels comfortable with back precautions and energy conservation. Will sign off. Lesle Chris, OTR/L Acute Rehabilitation Services [phone] [facility] pager [phone] office 12/28/2018

## 2018-12-28 NOTE — Progress Notes (Signed)
Physical Therapy Treatment Patient Details Name: Robert Gregory MRN: [id number] DOB: Nov 09, 1940 Today's Date: 12/28/2018    History of Present Illness Pt is a 78 y/o male admitted 12/03/18 with dizziness, bradycardia and near syncope. Pt with complete heart block and metastatic hepatocellular carinoma with T7 compression fx. Not a candidate for verterbroplasty, to begin palliative radiation at [facility] 3/20.   4/2 in the am developed fever and hypotension-->Transferred to ICU, back to floor 4/3. PMH includes DM, MI, ICD, HTN, HLD, A-fib.    PT Comments    Pt very cooperative and progressing slowly with mobility - fatigues easily  Follow Up Recommendations  Home health PT;Supervision for mobility/OOB     Equipment Recommendations  None recommended by PT    Recommendations for Other Services       Precautions / Restrictions Precautions Precautions: Back;Fall Precaution Booklet Issued: No Precaution Comments: pt does not wish to use brace Required Braces or Orthoses: Spinal Brace Spinal Brace: Thoracolumbosacral orthotic Restrictions Weight Bearing Restrictions: No    Mobility  Bed Mobility Overal bed mobility: Needs Assistance Bed Mobility: Rolling;Sidelying to Sit;Sit to Sidelying Rolling: Supervision Sidelying to sit: Min guard       General bed mobility comments: pt utilizing bed rail to self assist  Transfers Overall transfer level: Needs assistance Equipment used: Rolling walker (2 wheeled) Transfers: Sit to/from Stand Sit to Stand: Supervision Stand pivot transfers: Min guard       General transfer comment: cues for safe technique; stand/pvt with RW bed to [facility]  Ambulation/Gait Ambulation/Gait assistance: Min guard;Supervision Gait Distance (Feet): 34 Feet(twice) Assistive device: Rolling walker (2 wheeled) Gait Pattern/deviations: Step-through pattern;Decreased stride length Gait velocity: decr    General Gait Details: close gueard for safety;  Cues  for posture, pace and position from [employer] Mobility    Modified Rankin (Stroke Patients Only)       Balance Overall balance assessment: Needs assistance Sitting-balance support: Feet supported;No upper extremity supported Sitting balance-Leahy Scale: Good     Standing balance support: Bilateral upper extremity supported Standing balance-Leahy Scale: Poor                              Cognition Arousal/Alertness: Awake/alert Behavior During Therapy: WFL for tasks assessed/performed Overall Cognitive Status: Within Functional Limits for tasks assessed                         Following Commands: Follows one step commands consistently       General Comments: A&O x 4      Exercises      General Comments        Pertinent Vitals/Pain Pain Assessment: No/denies pain Pain Intervention(s): Monitored during session    Home Living                      Prior Function            PT Goals (current goals can now be found in the care plan section) Acute Rehab PT Goals Patient Stated Goal: home, hopefully today PT Goal Formulation: With patient Time For Goal Achievement: 12/27/18 Potential to Achieve Goals: Good Progress towards PT goals: Progressing toward goals    Frequency    Min 3X/week      PT Plan Current plan remains appropriate    Co-evaluation  AM-PAC PT "6 Clicks" Mobility   Outcome Measure  Help needed turning from your back to your side while in a flat bed without using bedrails?: A Little Help needed moving from lying on your back to sitting on the side of a flat bed without using bedrails?: A Little Help needed moving to and from a bed to a chair (including a wheelchair)?: A Little Help needed standing up from a chair using your arms (e.g., wheelchair or bedside chair)?: A Little Help needed to walk in hospital room?: A Little Help needed climbing 3-5 steps  with a railing? : A Lot 6 Click Score: 17    End of Session   Activity Tolerance: Patient limited by fatigue Patient left: in chair;with call bell/phone within reach;with chair alarm set Nurse Communication: Mobility status PT Visit Diagnosis: Muscle weakness (generalized) (M62.81);Unsteadiness on feet (R26.81)     Time: 1010-1040 PT Time Calculation (min) (ACUTE ONLY): 30 min  Charges:  $Gait Training: 8-22 mins $Therapeutic Activity: 8-22 mins                     [place] Pager [phone] Office [phone]    Annleigh Knueppel 12/28/2018, 12:43 PM

## 2018-12-28 NOTE — TOC Transition Note (Signed)
Transition of Care [facility]) - CM/SW Discharge Note   Patient Details  Name: Robert Gregory MRN: [id number] Date of Birth: 1941-08-07  [facility]) CM/SW Contact:  Dessa Phi, RN Phone Number: 12/28/2018, 2:44 PM   Clinical Narrative:  [facility]el Cluck iv infusion rep Pam has a box with iv abx supplies to continue with iv pump ampicillin prior to d/c home-box is in patient's rm-Pam rep will contact spouse to pick up prior to d/c since [facility] will not accept any additional items in transport per their regulations.  Nsg aware.     Final next level of care: [place] Barriers to Discharge: No Barriers Identified   Patient Goals and CMS Choice Patient states their goals for this hospitalization and ongoing recovery are:: (go home) CMS Medicare.gov Compare Post Acute Care list provided to:: Patient Represenative (must comment)(spouse-[place]) Choice offered to / list presented to : Spouse  Discharge Placement                       Discharge Plan and Services   Discharge Planning Services: CM Consult Post Acute Care Choice: Home Health              HH Arranged: RN, PT, OT, Social Work, Nurse's Aide, IV Antibiotics HH Agency: Tour manager, [place]   Social Determinants of Health (SDOH) Interventions     Readmission Risk Interventions Readmission Risk Prevention Plan 12/12/2018  Transportation Screening Complete  Medication Review Press photographer) Complete  PCP or Specialist appointment within 3-5 days of discharge Complete  HRI or Home Care Consult Complete  SW Recovery Care/Counseling Consult Complete  Palliative Care Screening Complete  Skilled Nursing Facility Complete  Some recent data might be hidden

## 2018-12-28 NOTE — TOC Progression Note (Signed)
Transition of Care [facility]) - Progression Note    Patient Details  Name: RYUN VELEZ MRN: [id number] Date of Birth: 02-Feb-1941  Transition [facility] Care [facility]) CM/SW Contact  Nikitas Davtyan, Juliann Pulse, RN Phone Number: 12/28/2018, 9:48 AM  Clinical Narrative: D/c today per attending.[employer] rep Pam-iv infusion ampicillin(pump) & rocephin-instruction to spouse late today 4:30-5p;[facility] HHC rep Tommi Rumps will provide HHC-further instruction,& services. [facility] for non emergency ambulance transp-forms in shadow chart(DNR).     Expected Discharge Plan: [place] Barriers to Discharge: No Barriers Identified  Expected Discharge Plan and Services Expected Discharge Plan: [place]   Discharge Planning Services: CM Consult Post Acute Care Choice: [place] arrangements for the past 2 months: Single Family Home Expected Discharge Date: 12/27/18                   HH Arranged: RN, PT, OT, Social Work, Nurse's Aide, IV Antibiotics HH Agency: Tour manager, [place]   Social Determinants of Health (SDOH) Interventions    Readmission Risk Interventions Readmission Risk Prevention Plan 12/12/2018  Transportation Screening Complete  Medication Review Press photographer) Complete  PCP or Specialist appointment within 3-5 days of discharge Complete  HRI or Home Care Consult Complete  SW Recovery Care/Counseling Consult Complete  Palliative Care Screening Complete  Skilled Nursing Facility Complete  Some recent data might be hidden

## 2018-12-28 NOTE — Progress Notes (Signed)
Discharge instructions given to pt and all questions were answered.  

## 2018-12-28 NOTE — Progress Notes (Signed)
[facility] for Infectious Disease   Reason for visit: Follow up on Enterococcal sepsis  Interval History: PICC placed yesterday afternoon; nursing reports no events o/n; discussed tx plan in detail with his EP physician ([name]) and hospitalist in detail; appetite beginning to improve as is ambulation; pt not thrilled about back brace as he says it's uncomfortable     Physical Exam: Vitals:   12/27/18 2100 12/28/18 0540  BP: 132/73 122/78  Pulse: 83 82  Resp: 20 18  Temp: 98.3 F (36.8 C) 98.2 F (36.8 C)  SpO2: 92% 95%  Constitutional: patient appears in NAD, A & O x 3 Eyes: anicteric, PERRL, EOMI HENT: no thrush, MMM, fair dentition Respiratory: Normal respiratory effort; CTA B Cardiovascular: RRR, I/VI SEM at LLSB, no TTP or erythema overlying ICD GI: soft, nt, obese, diffusely distended, +BS Extrems: RT distal leg wrapped with slight serous drainage but no erythema, 1+ non-pitting LE edema with venous stasis dermatitis noted  Review of Systems: All systems were reviewed and negative except as noted above.  Lab Results  Component Value Date   WBC 6.4 12/28/2018   HGB 11.8 (L) 12/28/2018   HCT 37.4 (L) 12/28/2018   MCV 99.5 12/28/2018   PLT 45 (L) 12/28/2018    Lab Results  Component Value Date   CREATININE 0.80 12/28/2018   BUN 27 (H) 12/28/2018   NA 139 12/28/2018   K 4.3 12/28/2018   CL 104 12/28/2018   CO2 27 12/28/2018    Lab Results  Component Value Date   ALT 137 (H) 12/28/2018   AST 102 (H) 12/28/2018   ALKPHOS 361 (H) 12/28/2018     Microbiology: Recent Results (from the past 240 hour(s))  Culture, blood (routine x 2)     Status: Abnormal   Collection Time: 12/21/18  7:50 AM  Result Value Ref Range Status   Specimen Description   Final    BLOOD LEFT ARM Performed at [facility], [place] [address] [place], [place] [address]    Special Requests   Final    BOTTLES DRAWN AEROBIC AND ANAEROBIC Blood Culture adequate  volume Performed at [place] [address] [place], [place] [address]    Culture  Setup Time   Final    GRAM POSITIVE COCCI IN BOTH AEROBIC AND ANAEROBIC BOTTLES CRITICAL VALUE NOTED.  VALUE IS CONSISTENT WITH PREVIOUSLY REPORTED AND CALLED VALUE.    Culture (A)  Final    ENTEROCOCCUS FAECALIS SUSCEPTIBILITIES PERFORMED ON PREVIOUS CULTURE WITHIN THE LAST 5 DAYS. Performed at [facility], [place] [address] [place], [place] [address]    Report Status 12/23/2018 FINAL  Final  Culture, blood (routine x 2)     Status: Abnormal   Collection Time: 12/21/18  7:51 AM  Result Value Ref Range Status   Specimen Description   Final    BLOOD RIGHT ARM Performed at [place] [address] [place], [place] [address]    Special Requests   Final    BOTTLES DRAWN AEROBIC AND ANAEROBIC Blood Culture adequate volume Performed at [place] [address] [place], [place] [address]    Culture  Setup Time   Final    IN BOTH AEROBIC AND ANAEROBIC BOTTLES GRAM POSITIVE COCCI CRITICAL RESULT CALLED TO, READ BACK BY AND VERIFIED WITH: B GREEN PHARMD 12/21/18 2337 JDW Performed at [facility], [place] [address] [place], [place] [address]  Culture ENTEROCOCCUS FAECALIS (A)  Final   Report Status 12/23/2018 FINAL  Final   Organism ID, Bacteria ENTEROCOCCUS FAECALIS  Final      Susceptibility   Enterococcus faecalis - MIC*    AMPICILLIN <=2 SENSITIVE Sensitive     VANCOMYCIN 1 SENSITIVE Sensitive     GENTAMICIN SYNERGY SENSITIVE Sensitive     * ENTEROCOCCUS FAECALIS  Blood Culture ID Panel (Reflexed)     Status: Abnormal   Collection Time: 12/21/18  7:51 AM  Result Value Ref Range Status   Enterococcus species DETECTED (A) NOT DETECTED Final    Comment: CRITICAL RESULT CALLED TO, READ BACK BY AND VERIFIED WITH: B GREEN PHARMD 12/21/18 2337 JDW    Vancomycin resistance NOT DETECTED NOT DETECTED Final   Listeria  monocytogenes NOT DETECTED NOT DETECTED Final   Staphylococcus species NOT DETECTED NOT DETECTED Final   Staphylococcus aureus (BCID) NOT DETECTED NOT DETECTED Final   Streptococcus species NOT DETECTED NOT DETECTED Final   Streptococcus agalactiae NOT DETECTED NOT DETECTED Final   Streptococcus pneumoniae NOT DETECTED NOT DETECTED Final   Streptococcus pyogenes NOT DETECTED NOT DETECTED Final   Acinetobacter baumannii NOT DETECTED NOT DETECTED Final   Enterobacteriaceae species NOT DETECTED NOT DETECTED Final   Enterobacter cloacae complex NOT DETECTED NOT DETECTED Final   Escherichia coli NOT DETECTED NOT DETECTED Final   Klebsiella oxytoca NOT DETECTED NOT DETECTED Final   Klebsiella pneumoniae NOT DETECTED NOT DETECTED Final   Proteus species NOT DETECTED NOT DETECTED Final   Serratia marcescens NOT DETECTED NOT DETECTED Final   Haemophilus influenzae NOT DETECTED NOT DETECTED Final   Neisseria meningitidis NOT DETECTED NOT DETECTED Final   Pseudomonas aeruginosa NOT DETECTED NOT DETECTED Final   Candida albicans NOT DETECTED NOT DETECTED Final   Candida glabrata NOT DETECTED NOT DETECTED Final   Candida krusei NOT DETECTED NOT DETECTED Final   Candida parapsilosis NOT DETECTED NOT DETECTED Final   Candida tropicalis NOT DETECTED NOT DETECTED Final    Comment: Performed at [facility], [address], [place], [place] [address]  Culture, Urine     Status: None   Collection Time: 12/21/18  9:59 AM  Result Value Ref Range Status   Specimen Description   Final    URINE, CLEAN CATCH Performed at [facility], [place] [address] [place], [place] [address]    Special Requests   Final    NONE Performed at [facility], [place] [address], [place], [place] [address]    Culture   Final    NO GROWTH Performed at [facility], [place] [address], [place], [place] [address]    Report Status 12/22/2018 FINAL  Final  MRSA PCR Screening     Status:  None   Collection Time: 12/21/18  2:48 PM  Result Value Ref Range Status   MRSA by PCR NEGATIVE NEGATIVE Final    Comment:        The GeneXpert MRSA Assay (FDA approved for NASAL specimens only), is one component of a comprehensive MRSA colonization surveillance program. It is not intended to diagnose MRSA infection nor to guide or monitor treatment for MRSA infections. Performed at [facility], [place] [address] [place],  [address]   Culture, blood (routine x 2)     Status: None   Collection Time: 12/22/18  9:03 AM  Result Value Ref Range Status   Specimen Description   Final    BLOOD RIGHT HAND Performed  at [facility], [place] [address] [place], [place] [address]    Special Requests   Final    BOTTLES DRAWN AEROBIC AND ANAEROBIC Blood Culture adequate volume Performed at [place] [address] [place], [place] [address]    Culture   Final    NO GROWTH 5 DAYS Performed at [facility], [place] [address], [place], [place] [address]    Report Status 12/27/2018 FINAL  Final  Culture, blood (routine x 2)     Status: None   Collection Time: 12/22/18  9:06 AM  Result Value Ref Range Status   Specimen Description   Final    BLOOD LEFT ARM Performed at [place] [address], [place], [place] [address]    Special Requests   Final    BOTTLES DRAWN AEROBIC AND ANAEROBIC Blood Culture adequate volume Performed at [place] [address] [place], [place] [address]    Culture   Final    NO GROWTH 5 DAYS Performed at [facility], [place] [address], [place], [place] [address]    Report Status 12/27/2018 FINAL  Final    Impression/Plan: The patient is a 78 y/o obese WM with DM and CRI, h/o heart block, s/p PM, NASH and recently diagnosed metastatic HCC, s/p portocath placement admitted with fever, thrombocytopenia, and Enterococcal sepsis/PVIE.  1.  Enterococcal sepsis - Both initial blood cxs were positive for pan-sensitive Enterococcus faecalis. Urine cx did not suggest this was source of his infection. Given the advanced stage of his HCC, worry for endogenous GI translocation as this would be most likely cause. This played a role in rationale to remove portocath (along with known data with pathogen adhesion to foreign devices). Repeat blood cxs are showing signs of clearance and TTE was unrevealing. Unfortunately, follow up TEE confirmed a vegetation along his ICD pacer wire, thus confirming prosthetic/device-related endocarditis. Repeat blood cxs remain negative, so PICC placed yesterday. I cannot agree that evidence points to a possible sterile thrombus to his pacer wire in this setting as he clearly meets [facility] criteria for PVIE. Cardiologist has reviewed images and recommended medical treatment, but this would leave the patient only an ~30% likelihood of medical cure. I discussed the patient's infection and co-morbidities in details with his electrophysiologist [name] today. We agreed to hold his xarelto for the duration of his ABX treatment as rifampin would otherwise be contra-indicated if xarelto continued. This will assist in penetrating the biofilm from the infection along his pacer wire. The patient was educated re: rifampin side effects, including changing body fluids to an orange color while he is on the medication. Unfortunately, synergistic gentamicin would pose a significant risk for renal decline and extension of his hospitalization by 2 weeks as peaks and troughs cannot be safely drawn for monitoring as an OP. This leaves [place] with the option of using rocephin q 12 hrs (also concerning for development of AIN with dual beta-lactam treatment over 6 weeks now). Unfortunately, the latter is likely is his "safest" tx option. While it is recognized, he has a h/o complete heart block, ideally he would have his ICD explanted in this setting from an  ID standpoint. Unfortunately, there is no evidence to support prolonged lifelong ABX suppression in a patient following IV ABX treatment and his risk for recurrence will remain high. Keeping his active stage IV HCC in mind, it seems worth attempting to medically treat him for now. Dr.  Lovena Le and I both agreed that should the patient show signs of decompensated heart failure, ARF while on treatment, or relapse of infection after 6 weeks of IV ABX, then his ICD will need to be extracted if a palliative route is not elected at that time. I have made arrangements for ongoing IV tx as an OP, he will f/u in my clinic on Wednesday 01/03/2019 at 10:15 AM. Tentative ABX d/c date would be 02/01/2019 for IV ampicillin 2 gm q 4 hrs along with rocephin 2 gm IV q 12 hrs and PO rifampin 300 mg BID. > 20 minutes of time spent on tentative d/c planning alone today.  2. Fever - now resolved with targeted ABX tx. Repeat blood cxs x 2 for fever > 100.5.  3. [place] - Pt is s/p palliative XRT. Oncologist is eager to initiate immunotherapy soon for tx. Provided this tx would not result in neutropenia, it may be allowable for pt to begin chemo in next 10-14 days. Recommend close OP f/u with oncologist. Suspect transaminitis is due to HCC/tumor burden at this time.Unfortunately, this issue also requires close attention to be paid to his LFTs as an OP given need for high-dose synergistic rocephin as noted above. Ordinarily, would advise holding chemo until his ABX tx is complete, but as endogenous seeding secondary to dislocation of GI flora from his tumor burden is suspected, delayed chemo initiation of 10-14 days would likely be the best course of action to assist in clearing his bacteremia/PVIE.

## 2018-12-28 NOTE — Care Management Important Message (Signed)
Important Message  Patient Details IM Letter given to Case Manager to present to the Patient  Name: Robert Gregory MRN: [id number] Date of Birth: 1941/08/09   Medicare Important Message Given:  Yes    [name] 12/28/2018, 11:29 AMImportant Message  Patient Details  Name: Robert Gregory MRN: [id number] Date of Birth: 11-Oct-1940   Medicare Important Message Given:  Yes    [name] 12/28/2018, 11:29 AM

## 2018-12-28 NOTE — Progress Notes (Signed)
Heparin lock not completed due to home medication infusion started prior to discharge.

## 2018-12-28 NOTE — TOC Transition Note (Signed)
Transition of Care [facility]) - CM/SW Discharge Note   Patient Details  Name: Robert Gregory MRN: [id number] Date of Birth: July 27, 1941  [facility] of [facility]) CM/SW Contact:  Dessa Phi, RN Phone Number: 12/28/2018, 3:20 PM  Clinical Narrative: Spoke to [facility] rep [place] tel#[phone] about transportation-confirmed patient can transport with 2 boxes of meds(already labeled), CPAP in a gray carry case, & clothes-Nurse will call [facility] once patient is ready for transport. Nurse Jarrett Soho aware. No further CM needs.      Final next level of care: [place] Barriers to Discharge: No Barriers Identified   Patient Goals and CMS Choice Patient states their goals for this hospitalization and ongoing recovery are:: (go home) CMS Medicare.gov Compare Post Acute Care list provided to:: Patient Represenative (must comment)(spouse-[place]) Choice offered to / list presented to : Spouse  Discharge Placement                       Discharge Plan and Services   Discharge Planning Services: CM Consult Post Acute Care Choice: Home Health              HH Arranged: RN, PT, OT, Social Work, Nurse's Aide, IV Antibiotics HH Agency: Tour manager, [place]   Social Determinants of Health (SDOH) Interventions     Readmission Risk Interventions Readmission Risk Prevention Plan 12/12/2018  Transportation Screening Complete  Medication Review Press photographer) Complete  PCP or Specialist appointment within 3-5 days of discharge Complete  HRI or Home Care Consult Complete  SW Recovery Care/Counseling Consult Complete  Palliative Care Screening Complete  Skilled Nursing Facility Complete  Some recent data might be hidden

## 2018-12-29 DIAGNOSIS — N183 Chronic kidney disease, stage 3 (moderate): Secondary | ICD-10-CM | POA: Diagnosis not present

## 2018-12-29 DIAGNOSIS — I5022 Chronic systolic (congestive) heart failure: Secondary | ICD-10-CM | POA: Diagnosis not present

## 2018-12-29 DIAGNOSIS — I4891 Unspecified atrial fibrillation: Secondary | ICD-10-CM | POA: Diagnosis not present

## 2018-12-29 DIAGNOSIS — L03115 Cellulitis of right lower limb: Secondary | ICD-10-CM | POA: Diagnosis not present

## 2018-12-29 DIAGNOSIS — E1122 Type 2 diabetes mellitus with diabetic chronic kidney disease: Secondary | ICD-10-CM | POA: Diagnosis not present

## 2018-12-29 DIAGNOSIS — I130 Hypertensive heart and chronic kidney disease with heart failure and stage 1 through stage 4 chronic kidney disease, or unspecified chronic kidney disease: Secondary | ICD-10-CM | POA: Diagnosis not present

## 2018-12-30 DIAGNOSIS — N183 Chronic kidney disease, stage 3 (moderate): Secondary | ICD-10-CM | POA: Diagnosis not present

## 2018-12-30 DIAGNOSIS — L03115 Cellulitis of right lower limb: Secondary | ICD-10-CM | POA: Diagnosis not present

## 2018-12-30 DIAGNOSIS — E1122 Type 2 diabetes mellitus with diabetic chronic kidney disease: Secondary | ICD-10-CM | POA: Diagnosis not present

## 2018-12-30 DIAGNOSIS — I130 Hypertensive heart and chronic kidney disease with heart failure and stage 1 through stage 4 chronic kidney disease, or unspecified chronic kidney disease: Secondary | ICD-10-CM | POA: Diagnosis not present

## 2018-12-30 DIAGNOSIS — I5022 Chronic systolic (congestive) heart failure: Secondary | ICD-10-CM | POA: Diagnosis not present

## 2018-12-30 DIAGNOSIS — I4891 Unspecified atrial fibrillation: Secondary | ICD-10-CM | POA: Diagnosis not present

## 2019-01-01 ENCOUNTER — Other Ambulatory Visit: Payer: Self-pay | Admitting: Hematology & Oncology

## 2019-01-01 ENCOUNTER — Other Ambulatory Visit: Payer: Self-pay

## 2019-01-01 ENCOUNTER — Telehealth: Payer: Self-pay | Admitting: Behavioral Health

## 2019-01-01 DIAGNOSIS — I130 Hypertensive heart and chronic kidney disease with heart failure and stage 1 through stage 4 chronic kidney disease, or unspecified chronic kidney disease: Secondary | ICD-10-CM | POA: Diagnosis not present

## 2019-01-01 DIAGNOSIS — L03115 Cellulitis of right lower limb: Secondary | ICD-10-CM | POA: Diagnosis not present

## 2019-01-01 DIAGNOSIS — I4891 Unspecified atrial fibrillation: Secondary | ICD-10-CM | POA: Diagnosis not present

## 2019-01-01 DIAGNOSIS — A4181 Sepsis due to Enterococcus: Secondary | ICD-10-CM

## 2019-01-01 DIAGNOSIS — I5022 Chronic systolic (congestive) heart failure: Secondary | ICD-10-CM | POA: Diagnosis not present

## 2019-01-01 DIAGNOSIS — E1122 Type 2 diabetes mellitus with diabetic chronic kidney disease: Secondary | ICD-10-CM | POA: Diagnosis not present

## 2019-01-01 DIAGNOSIS — N183 Chronic kidney disease, stage 3 (moderate): Secondary | ICD-10-CM | POA: Diagnosis not present

## 2019-01-01 MED ORDER — FENTANYL 25 MCG/HR TD PT72: 1.0000 | MEDICATED_PATCH | TRANSDERMAL | 0 refills | Status: DC

## 2019-01-01 MED ORDER — OXYCODONE HCL 5 MG PO TABS: 5.0000 mg | ORAL_TABLET | ORAL | 0 refills | Status: AC | PRN

## 2019-01-01 NOTE — Telephone Encounter (Signed)
Patient's wife Parke Simmers called stating Mr. Clemon will be unable to come to his [facility] appointment  01/03/2019 because he is physically unable.  She states the nurse is coming out to do lab work and wondered if they could do an EVISIT over the phone?  She states [name] told Mr Trickey to discontinue his Lasix however patient's wife states he has had excess fluid and is taking Lasix 31m once a day.  APricilla RiffleRN

## 2019-01-02 ENCOUNTER — Other Ambulatory Visit: Payer: Self-pay | Admitting: *Deleted

## 2019-01-02 MED ORDER — FENTANYL 25 MCG/HR TD PT72: 1.0000 | MEDICATED_PATCH | TRANSDERMAL | 0 refills | Status: DC

## 2019-01-02 MED ORDER — FENTANYL 25 MCG/HR TD PT72: 1.0000 | MEDICATED_PATCH | TRANSDERMAL | 0 refills | Status: AC

## 2019-01-02 MED ORDER — NYSTATIN 100000 UNIT/ML MT SUSP: 5.0000 mL | Freq: Four times a day (QID) | OROMUCOSAL | 0 refills | Status: AC

## 2019-01-02 NOTE — Telephone Encounter (Signed)
New Message   Patient's wife would like you to call back in 30 minutes she want's to speak with you but has to change her husbands IV fluids.

## 2019-01-02 NOTE — Telephone Encounter (Signed)
Called patient wife Robert Gregory back and informed her [name] he was supposed to Stop Xarelto and he spoke to [name] who states it was ok for him to stop it. Robert Gregory states this was correct. Robert Gregory did verify that the hospitalist did tell Robert Gregory to stop the Lasix.    Robert Gregory states that Robert Gregory is physically unable to come into the office tomorrow.  She states he is extremely weak and is only able to get up to a bedside commode and then back to bed.  She states Robert Gregory has a phone visit with his [name]  tomorrow and at that time he will decide whether to stop all treatment and do Hospice care.    Rescheduled Robert Gregory appointment with [name] for early May however wife Robert Gregory states she will call to cancel if Robert Gregory decides Hospice.  She was apologetic and wanted [name] to be aware.   [name] RN

## 2019-01-02 NOTE — Telephone Encounter (Signed)
Robert Gregory, Seems there is some confusion here. I never told the patient to stop his lasix. If he was told that, it would have been the hospitalist. I DID tell him to stop his xarelto (blood thinner); however, because it interacts with his rifampin that we have him on. His electrophysiologist [name] told it was ok to hold the xarelto. Re: request for an e-visit, unfortunately, he needs to be seen in person, so that wouldn't be the wisest way to handle his care.

## 2019-01-03 ENCOUNTER — Inpatient Hospital Stay: Payer: Medicare Other | Admitting: Infectious Diseases

## 2019-01-03 DIAGNOSIS — I130 Hypertensive heart and chronic kidney disease with heart failure and stage 1 through stage 4 chronic kidney disease, or unspecified chronic kidney disease: Secondary | ICD-10-CM | POA: Diagnosis not present

## 2019-01-03 DIAGNOSIS — I5022 Chronic systolic (congestive) heart failure: Secondary | ICD-10-CM | POA: Diagnosis not present

## 2019-01-03 DIAGNOSIS — N183 Chronic kidney disease, stage 3 (moderate): Secondary | ICD-10-CM | POA: Diagnosis not present

## 2019-01-03 DIAGNOSIS — E1122 Type 2 diabetes mellitus with diabetic chronic kidney disease: Secondary | ICD-10-CM | POA: Diagnosis not present

## 2019-01-03 DIAGNOSIS — I4891 Unspecified atrial fibrillation: Secondary | ICD-10-CM | POA: Diagnosis not present

## 2019-01-03 DIAGNOSIS — L03115 Cellulitis of right lower limb: Secondary | ICD-10-CM | POA: Diagnosis not present

## 2019-01-03 NOTE — Telephone Encounter (Signed)
Ok. Please touch base with her on Thursday if you don't hear anything back from his wife. If they elect against hospice, then he'll need to be worked in as [name] has offered to remove his ICD if he worsens.

## 2019-01-04 ENCOUNTER — Telehealth: Payer: Self-pay

## 2019-01-04 ENCOUNTER — Other Ambulatory Visit: Payer: Self-pay

## 2019-01-04 ENCOUNTER — Other Ambulatory Visit: Payer: Self-pay | Admitting: Hematology & Oncology

## 2019-01-04 ENCOUNTER — Telehealth: Payer: Self-pay | Admitting: *Deleted

## 2019-01-04 ENCOUNTER — Ambulatory Visit (HOSPITAL_COMMUNITY)
Admission: RE | Admit: 2019-01-04 | Discharge: 2019-01-04 | Disposition: A | Discharge status: Home / Self Care | Payer: Medicare Other | Source: Ambulatory Visit | Attending: Family | Admitting: Family

## 2019-01-04 ENCOUNTER — Other Ambulatory Visit: Payer: Self-pay | Admitting: *Deleted

## 2019-01-04 ENCOUNTER — Other Ambulatory Visit: Payer: Self-pay | Admitting: Family

## 2019-01-04 ENCOUNTER — Encounter (HOSPITAL_COMMUNITY): Payer: Self-pay | Admitting: Student

## 2019-01-04 ENCOUNTER — Telehealth: Payer: Medicare Other | Admitting: Internal Medicine

## 2019-01-04 DIAGNOSIS — N183 Chronic kidney disease, stage 3 (moderate): Secondary | ICD-10-CM | POA: Diagnosis not present

## 2019-01-04 DIAGNOSIS — R52 Pain, unspecified: Secondary | ICD-10-CM | POA: Diagnosis not present

## 2019-01-04 DIAGNOSIS — R180 Malignant ascites: Secondary | ICD-10-CM | POA: Diagnosis not present

## 2019-01-04 DIAGNOSIS — R609 Edema, unspecified: Secondary | ICD-10-CM | POA: Diagnosis not present

## 2019-01-04 DIAGNOSIS — R188 Other ascites: Secondary | ICD-10-CM | POA: Diagnosis not present

## 2019-01-04 DIAGNOSIS — E1122 Type 2 diabetes mellitus with diabetic chronic kidney disease: Secondary | ICD-10-CM | POA: Diagnosis not present

## 2019-01-04 DIAGNOSIS — L03115 Cellulitis of right lower limb: Secondary | ICD-10-CM | POA: Diagnosis not present

## 2019-01-04 DIAGNOSIS — I4891 Unspecified atrial fibrillation: Secondary | ICD-10-CM | POA: Diagnosis not present

## 2019-01-04 DIAGNOSIS — R0902 Hypoxemia: Secondary | ICD-10-CM | POA: Diagnosis not present

## 2019-01-04 DIAGNOSIS — R1084 Generalized abdominal pain: Secondary | ICD-10-CM | POA: Diagnosis not present

## 2019-01-04 DIAGNOSIS — R0602 Shortness of breath: Secondary | ICD-10-CM | POA: Diagnosis not present

## 2019-01-04 DIAGNOSIS — C220 Liver cell carcinoma: Secondary | ICD-10-CM

## 2019-01-04 DIAGNOSIS — I5022 Chronic systolic (congestive) heart failure: Secondary | ICD-10-CM | POA: Diagnosis not present

## 2019-01-04 DIAGNOSIS — I130 Hypertensive heart and chronic kidney disease with heart failure and stage 1 through stage 4 chronic kidney disease, or unspecified chronic kidney disease: Secondary | ICD-10-CM | POA: Diagnosis not present

## 2019-01-04 HISTORY — PX: IR PARACENTESIS: IMG2679

## 2019-01-04 MED ORDER — LIDOCAINE HCL 1 % IJ SOLN
INTRAMUSCULAR | Status: AC
  Filled 2019-01-04: qty 20

## 2019-01-04 MED ORDER — LIDOCAINE HCL (PF) 1 % IJ SOLN
INTRAMUSCULAR | Status: AC | PRN
  Administered 2019-01-04: 10 mL

## 2019-01-04 NOTE — Telephone Encounter (Signed)
Call placed to patient's wife and she has confirmed that she has called for an ambulance to pick up patient for paracentesis today at 1:00PM.

## 2019-01-04 NOTE — Telephone Encounter (Signed)
Patient wife called to say Robert Gregory is on his way by ambulance to IR for a paracentesis.

## 2019-01-04 NOTE — Telephone Encounter (Signed)
Patient's wife notified per order of [name] that PICC line can stay out at this time, pt can have paracentesis if pt is able to get to the hospital and order for home O2 will be sent in for pt.  Pt.'s wife states that she will call an ambulance to get pt to the hospital for paracentesis.  [name] notified.

## 2019-01-04 NOTE — Progress Notes (Signed)
Robert Gregory

## 2019-01-04 NOTE — Telephone Encounter (Signed)
Coretta from [facility] called stating patient's wife notified them that patient's PICC line came out last night.  She states per patient wife [name] has said the PICC line can stay out at this time.  Informed Deberah Pelton patient's wife and [name] to be notified for further clarity.  Called Parke Simmers patient's wife.  She states patient's PICC came out accidentally during the night.  She states she put a 2x2 at the site.  She states there was no bleeding and is unable to tell about bruising because he has bruises all over.  She states Mr Schmader states he does not want to do IV antibiotics anymore Mr. Pritt agreed in the backgorund.  He is willing to take oral antibiotics.  She states Mr. Cozort abdomen is 3x the size it normally is.  She states she is trying to arrange transportation for him to have a paracentesis done per [name].  Informed wife Parke Simmers if [name] has additional information for her will give her a call back.  Parke Simmers verbalized understanding.  [name] RN

## 2019-01-04 NOTE — Telephone Encounter (Signed)
DME Home Oxygen for pt faxed to [facility]. Call to [facility], advised pt has thoracentesis at 1pm and [facility] will be working on Home O2.

## 2019-01-04 NOTE — Progress Notes (Signed)
  Radiation Oncology         [phone]) [id number] ________________________________  Name: Robert Gregory MRN: [id number]  Date: 12/26/2018  DOB: 04-10-1941  End of Treatment Note  Diagnosis:   78 y.o. male with Stage IV hepatocellular carcinoma with painful thoracic spine metastasis    Indication for treatment:  Palliative       Radiation treatment dates:   12/11/2018 - 12/26/2018  Site/dose:   Thoracic Spine / 30 Gy in 10 fractions  Beams/energy:   Isodose Plan / 10X Photon  Narrative: The patient tolerated radiation treatment relatively well.  He reports his pain is much improved. He is able to ambulate around his hospital room and was able to sit up and eat dinner for the first time. He denied any nausea.  Plan: The patient has completed radiation treatment. The patient will return to radiation oncology clinic for routine followup in one month. I advised them to call or return sooner if they have any questions or concerns related to their recovery or treatment.  -----------------------------------  Blair Promise, PhD, MD  This document serves as a record of services personally performed by Gery Pray, MD. It was created on his behalf by Rae Lips, a trained medical scribe. The creation of this record is based on the scribe's personal observations and the provider's statements to them. This document has been checked and approved by the attending provider.

## 2019-01-04 NOTE — Telephone Encounter (Signed)
Call placed to wife.  Agreed to doximity visit at 1:45 pm today.  Advised wife if she was busy at 1:45 pm [name] would just call her back when it's convenient for her.  Wife indicates understanding.

## 2019-01-04 NOTE — Telephone Encounter (Signed)
Call received from patient's wife stating that pt pulled PICC line out accidentally during the night.  She states that PICC site is not bleeding and that she covered the site with a 2X2 and tape. Pt.'s wife is also asking about getting an order for home O2 and for paracentesis.  Informed pt.'s wife that I would speak with [name] and call her back with orders.

## 2019-01-04 NOTE — Procedures (Signed)
PROCEDURE SUMMARY:  Successful US guided paracentesis from left lateral abdomen.  Yielded 6.0 liters of yellow fluid.  No immediate complications.  Pt tolerated well.   Specimen was not sent for labs.  EBL < 38m  KDocia BarrierPA-C 01/04/2019 2:15 PM

## 2019-01-05 DIAGNOSIS — E1122 Type 2 diabetes mellitus with diabetic chronic kidney disease: Secondary | ICD-10-CM | POA: Diagnosis not present

## 2019-01-05 DIAGNOSIS — I130 Hypertensive heart and chronic kidney disease with heart failure and stage 1 through stage 4 chronic kidney disease, or unspecified chronic kidney disease: Secondary | ICD-10-CM | POA: Diagnosis not present

## 2019-01-05 DIAGNOSIS — N183 Chronic kidney disease, stage 3 (moderate): Secondary | ICD-10-CM | POA: Diagnosis not present

## 2019-01-05 DIAGNOSIS — I5022 Chronic systolic (congestive) heart failure: Secondary | ICD-10-CM | POA: Diagnosis not present

## 2019-01-05 DIAGNOSIS — I4891 Unspecified atrial fibrillation: Secondary | ICD-10-CM | POA: Diagnosis not present

## 2019-01-05 DIAGNOSIS — L03115 Cellulitis of right lower limb: Secondary | ICD-10-CM | POA: Diagnosis not present

## 2019-01-05 NOTE — Telephone Encounter (Signed)
I had a 30+ minute conversation with the patient and his wife this evening. At this juncture, his desire is to refuse all further ABX care (no PICC, no PIV, no return to the hospital, and no resumption of IV ABX). Unfortunately, in his scenario, PO ABX will inadequate to treat his infection, so this cannot be offered as a viable option, particularly as he is not yet enrolled in hospice. He expressed understanding that death from his infection will be a near certainty in this instance. Reminded pt and his wife that I anticipate he will ultimately develop a cardiac arrhythmia due to his infection, which will prompt his ICD to fire. As his device has not been de-activated at this time, this would likely be quite uncomfortable and ultimately unsuccessful in resuscitating him. I encouraged him to have his [facility] nurse reach out to his cardiologist or EP physician to see if a magnet could be applied to his device to de-activate it, which would be more in line with his current wishes and refusal of ID care. The patient and his wife both expressed understanding of this recommendation.

## 2019-01-05 NOTE — Telephone Encounter (Signed)
Called Mr and Mrs. Orth today to verify plan of care moving forward per [name].  Patient states his plan is to go to hospice care once the arrangements are made.  He states, "I would rather have quality of life versus duration."  Patient states , I have already made the decision that I will be going into hospice in a few short weeks.  Explained to patient per [name] that there are no oral antibiotic options for his infection and that the only option would be to have PICC line reinserted and to complete IV antibiotic therapy.  Patient states he understands this but states he does not feel this will improve his quality of life.  Called [name] to relay the above information.  Informed him there are no concrete plans or actual  Hospice care orders in place at this time for Mr Bienvenue. Per [name] RN need to proceed with attempting to get PICC line replaced.    Called patient to inform him that PICC line orders have been placed to get PICC line reinserted and to restart IV antibiotics.  Informed them at this time there were no Hospice plans in place   Patient was not too happy with this.  Patient's wife interjected and stated that there is a letter written to Dr. Antonieta Pert office of patient wishes at the end of life.  There are no advanced Directives on file. However patient's wife states she has the proper paperwork and states she Mr. Schear has been talking about his wishes since he was diagnosed with Cancer in early March.  They ended the conversation by requesting a phone call from [name].  Best number to be reached is wife's cell phone [phone]  [facility] to schedule PICC placement. Spoke with [facility]. She states they will call patient to set up PICC placement.    Called [facility] spoke with Melissa informed her that patient will be resuming IV antibiotics once PICC line is replaced and will call  with that appointment information.  [name] RN

## 2019-01-05 NOTE — Addendum Note (Signed)
Addended by: Alberteen Sam on: 01/05/2019 03:00 PM   Modules accepted: Orders

## 2019-01-08 ENCOUNTER — Other Ambulatory Visit: Payer: Self-pay | Admitting: Hematology & Oncology

## 2019-01-08 ENCOUNTER — Telehealth: Payer: Self-pay | Admitting: Internal Medicine

## 2019-01-08 ENCOUNTER — Telehealth: Payer: Self-pay | Admitting: *Deleted

## 2019-01-08 DIAGNOSIS — Z95810 Presence of automatic (implantable) cardiac defibrillator: Secondary | ICD-10-CM | POA: Diagnosis not present

## 2019-01-08 DIAGNOSIS — I442 Atrioventricular block, complete: Secondary | ICD-10-CM | POA: Diagnosis not present

## 2019-01-08 DIAGNOSIS — L03115 Cellulitis of right lower limb: Secondary | ICD-10-CM | POA: Diagnosis not present

## 2019-01-08 DIAGNOSIS — I130 Hypertensive heart and chronic kidney disease with heart failure and stage 1 through stage 4 chronic kidney disease, or unspecified chronic kidney disease: Secondary | ICD-10-CM | POA: Diagnosis not present

## 2019-01-08 DIAGNOSIS — G4733 Obstructive sleep apnea (adult) (pediatric): Secondary | ICD-10-CM | POA: Diagnosis not present

## 2019-01-08 DIAGNOSIS — I2510 Atherosclerotic heart disease of native coronary artery without angina pectoris: Secondary | ICD-10-CM | POA: Diagnosis not present

## 2019-01-08 DIAGNOSIS — E1122 Type 2 diabetes mellitus with diabetic chronic kidney disease: Secondary | ICD-10-CM | POA: Diagnosis not present

## 2019-01-08 DIAGNOSIS — N183 Chronic kidney disease, stage 3 (moderate): Secondary | ICD-10-CM | POA: Diagnosis not present

## 2019-01-08 DIAGNOSIS — I4891 Unspecified atrial fibrillation: Secondary | ICD-10-CM | POA: Diagnosis not present

## 2019-01-08 DIAGNOSIS — C7951 Secondary malignant neoplasm of bone: Secondary | ICD-10-CM | POA: Diagnosis not present

## 2019-01-08 DIAGNOSIS — C220 Liver cell carcinoma: Secondary | ICD-10-CM | POA: Diagnosis not present

## 2019-01-08 DIAGNOSIS — I5032 Chronic diastolic (congestive) heart failure: Secondary | ICD-10-CM | POA: Diagnosis not present

## 2019-01-08 DIAGNOSIS — M1990 Unspecified osteoarthritis, unspecified site: Secondary | ICD-10-CM | POA: Diagnosis not present

## 2019-01-08 DIAGNOSIS — I38 Endocarditis, valve unspecified: Secondary | ICD-10-CM | POA: Diagnosis not present

## 2019-01-08 DIAGNOSIS — Z951 Presence of aortocoronary bypass graft: Secondary | ICD-10-CM | POA: Diagnosis not present

## 2019-01-08 NOTE — Telephone Encounter (Signed)
Message received from patient stating that she has spoken with her husband and [name] and that patient would like for Hospice to be called in for his care.  Patient's wife would also like to know how to get patient's "pacemaker turned off".  [name] notified and order received for Hospice.

## 2019-01-08 NOTE — Telephone Encounter (Signed)
Will send to Dr Lovena Le

## 2019-01-08 NOTE — Telephone Encounter (Signed)
Call placed to [facility] with [place] per order of [name] to enroll patient with Hospice.

## 2019-01-08 NOTE — Telephone Encounter (Signed)
Call placed to Dr. Tanna Furry office regarding patient's wives request to "turn off defibrillator".  This RN informed that call would be made back to Dr. Antonieta Pert request regarding defibrillator.

## 2019-01-08 NOTE — Telephone Encounter (Signed)
New message   Patient is on hospice and needs the defibulator to be turned off. Please call.

## 2019-01-09 ENCOUNTER — Other Ambulatory Visit (HOSPITAL_COMMUNITY): Payer: Self-pay | Admitting: Interventional Radiology

## 2019-01-09 ENCOUNTER — Other Ambulatory Visit: Payer: Self-pay | Admitting: Family

## 2019-01-09 DIAGNOSIS — E1122 Type 2 diabetes mellitus with diabetic chronic kidney disease: Secondary | ICD-10-CM | POA: Diagnosis not present

## 2019-01-09 DIAGNOSIS — I130 Hypertensive heart and chronic kidney disease with heart failure and stage 1 through stage 4 chronic kidney disease, or unspecified chronic kidney disease: Secondary | ICD-10-CM | POA: Diagnosis not present

## 2019-01-09 DIAGNOSIS — I38 Endocarditis, valve unspecified: Secondary | ICD-10-CM | POA: Diagnosis not present

## 2019-01-09 DIAGNOSIS — C220 Liver cell carcinoma: Secondary | ICD-10-CM

## 2019-01-09 DIAGNOSIS — R180 Malignant ascites: Secondary | ICD-10-CM

## 2019-01-09 DIAGNOSIS — C7951 Secondary malignant neoplasm of bone: Secondary | ICD-10-CM | POA: Diagnosis not present

## 2019-01-09 DIAGNOSIS — I442 Atrioventricular block, complete: Secondary | ICD-10-CM | POA: Diagnosis not present

## 2019-01-09 NOTE — Telephone Encounter (Signed)
Noted in [facility]art.

## 2019-01-09 NOTE — Telephone Encounter (Signed)
Call placed to Pt.  Per Pt rep from [facility] came to his house this AM and turned off his ICD.  Verified with Aaron Edelman.  Will notify device clinic.  Follow up appt with [name] cancelled per Pt.

## 2019-01-10 ENCOUNTER — Ambulatory Visit (HOSPITAL_COMMUNITY): Payer: Medicare Other

## 2019-01-10 ENCOUNTER — Other Ambulatory Visit: Payer: Self-pay

## 2019-01-10 ENCOUNTER — Ambulatory Visit (HOSPITAL_COMMUNITY): Admission: RE | Admit: 2019-01-10 | Payer: Medicare Other | Source: Ambulatory Visit

## 2019-01-10 ENCOUNTER — Encounter (HOSPITAL_COMMUNITY): Payer: Self-pay | Admitting: Radiology

## 2019-01-10 ENCOUNTER — Ambulatory Visit (HOSPITAL_COMMUNITY)
Admission: RE | Admit: 2019-01-10 | Discharge: 2019-01-10 | Disposition: A | Discharge status: Home / Self Care | Source: Ambulatory Visit | Attending: Family | Admitting: Family

## 2019-01-10 ENCOUNTER — Other Ambulatory Visit: Payer: Self-pay | Admitting: Family

## 2019-01-10 DIAGNOSIS — R5381 Other malaise: Secondary | ICD-10-CM | POA: Diagnosis not present

## 2019-01-10 DIAGNOSIS — C220 Liver cell carcinoma: Secondary | ICD-10-CM | POA: Diagnosis not present

## 2019-01-10 DIAGNOSIS — E1122 Type 2 diabetes mellitus with diabetic chronic kidney disease: Secondary | ICD-10-CM | POA: Diagnosis not present

## 2019-01-10 DIAGNOSIS — I38 Endocarditis, valve unspecified: Secondary | ICD-10-CM | POA: Diagnosis not present

## 2019-01-10 DIAGNOSIS — I442 Atrioventricular block, complete: Secondary | ICD-10-CM | POA: Diagnosis not present

## 2019-01-10 DIAGNOSIS — I130 Hypertensive heart and chronic kidney disease with heart failure and stage 1 through stage 4 chronic kidney disease, or unspecified chronic kidney disease: Secondary | ICD-10-CM | POA: Diagnosis not present

## 2019-01-10 DIAGNOSIS — R188 Other ascites: Secondary | ICD-10-CM | POA: Diagnosis not present

## 2019-01-10 DIAGNOSIS — R8569 Abnormal cytological findings in specimens from other digestive organs and abdominal cavity: Secondary | ICD-10-CM | POA: Diagnosis not present

## 2019-01-10 DIAGNOSIS — R180 Malignant ascites: Secondary | ICD-10-CM

## 2019-01-10 DIAGNOSIS — C7951 Secondary malignant neoplasm of bone: Secondary | ICD-10-CM | POA: Diagnosis not present

## 2019-01-10 DIAGNOSIS — R29898 Other symptoms and signs involving the musculoskeletal system: Secondary | ICD-10-CM | POA: Diagnosis not present

## 2019-01-10 HISTORY — PX: IR PARACENTESIS: IMG2679

## 2019-01-10 LAB — GRAM STAIN

## 2019-01-10 LAB — BODY FLUID CELL COUNT WITH DIFFERENTIAL
Eos, Fluid: 3 %
Lymphs, Fluid: 57 %
Monocyte-Macrophage-Serous Fluid: 38 % — ABNORMAL LOW (ref 50–90)
Neutrophil Count, Fluid: 2 % (ref 0–25)
Total Nucleated Cell Count, Fluid: 20 cu mm (ref 0–1000)

## 2019-01-10 MED ORDER — LIDOCAINE HCL 1 % IJ SOLN
INTRAMUSCULAR | Status: AC
  Filled 2019-01-10: qty 20

## 2019-01-10 MED ORDER — LIDOCAINE HCL (PF) 1 % IJ SOLN
INTRAMUSCULAR | Status: AC | PRN
  Administered 2019-01-10: 10 mL

## 2019-01-10 NOTE — Procedures (Signed)
Ultrasound-guided  therapeutic paracentesis performed yielding 7.2 liters of hazy, blood-tinged/amber colored fluid. A portion of the fluid was sent for cell count and culture. No immediate complications. EBL< 1cc. Pt will be scheduled for tunneled peritoneal drain in next 7- 12 days.

## 2019-01-11 LAB — PATHOLOGIST SMEAR REVIEW

## 2019-01-12 DIAGNOSIS — C220 Liver cell carcinoma: Secondary | ICD-10-CM | POA: Diagnosis not present

## 2019-01-12 DIAGNOSIS — I442 Atrioventricular block, complete: Secondary | ICD-10-CM | POA: Diagnosis not present

## 2019-01-12 DIAGNOSIS — I38 Endocarditis, valve unspecified: Secondary | ICD-10-CM | POA: Diagnosis not present

## 2019-01-12 DIAGNOSIS — I130 Hypertensive heart and chronic kidney disease with heart failure and stage 1 through stage 4 chronic kidney disease, or unspecified chronic kidney disease: Secondary | ICD-10-CM | POA: Diagnosis not present

## 2019-01-12 DIAGNOSIS — E1122 Type 2 diabetes mellitus with diabetic chronic kidney disease: Secondary | ICD-10-CM | POA: Diagnosis not present

## 2019-01-12 DIAGNOSIS — C7951 Secondary malignant neoplasm of bone: Secondary | ICD-10-CM | POA: Diagnosis not present

## 2019-01-15 LAB — CULTURE, BODY FLUID W GRAM STAIN -BOTTLE: Culture: NO GROWTH

## 2019-01-15 LAB — CULTURE, BODY FLUID-BOTTLE

## 2019-01-16 ENCOUNTER — Other Ambulatory Visit: Payer: Self-pay | Admitting: Student

## 2019-01-16 ENCOUNTER — Telehealth: Payer: Self-pay | Admitting: Hematology & Oncology

## 2019-01-16 NOTE — Telephone Encounter (Signed)
sw pt to confirm lab/ov 5/5 at 12 pm per 4/28 sch msg

## 2019-01-17 ENCOUNTER — Ambulatory Visit (HOSPITAL_COMMUNITY): Admission: RE | Admit: 2019-01-17 | Payer: Medicare Other | Source: Ambulatory Visit

## 2019-01-18 ENCOUNTER — Telehealth: Payer: Medicare Other | Admitting: Internal Medicine

## 2019-01-19 DIAGNOSIS — Z951 Presence of aortocoronary bypass graft: Secondary | ICD-10-CM | POA: Diagnosis not present

## 2019-01-19 DIAGNOSIS — C220 Liver cell carcinoma: Secondary | ICD-10-CM | POA: Diagnosis not present

## 2019-01-19 DIAGNOSIS — I4891 Unspecified atrial fibrillation: Secondary | ICD-10-CM | POA: Diagnosis not present

## 2019-01-19 DIAGNOSIS — L03115 Cellulitis of right lower limb: Secondary | ICD-10-CM | POA: Diagnosis not present

## 2019-01-19 DIAGNOSIS — I2510 Atherosclerotic heart disease of native coronary artery without angina pectoris: Secondary | ICD-10-CM | POA: Diagnosis not present

## 2019-01-19 DIAGNOSIS — I130 Hypertensive heart and chronic kidney disease with heart failure and stage 1 through stage 4 chronic kidney disease, or unspecified chronic kidney disease: Secondary | ICD-10-CM | POA: Diagnosis not present

## 2019-01-19 DIAGNOSIS — I38 Endocarditis, valve unspecified: Secondary | ICD-10-CM | POA: Diagnosis not present

## 2019-01-19 DIAGNOSIS — M1990 Unspecified osteoarthritis, unspecified site: Secondary | ICD-10-CM | POA: Diagnosis not present

## 2019-01-19 DIAGNOSIS — N183 Chronic kidney disease, stage 3 (moderate): Secondary | ICD-10-CM | POA: Diagnosis not present

## 2019-01-19 DIAGNOSIS — I442 Atrioventricular block, complete: Secondary | ICD-10-CM | POA: Diagnosis not present

## 2019-01-19 DIAGNOSIS — C7951 Secondary malignant neoplasm of bone: Secondary | ICD-10-CM | POA: Diagnosis not present

## 2019-01-19 DIAGNOSIS — G4733 Obstructive sleep apnea (adult) (pediatric): Secondary | ICD-10-CM | POA: Diagnosis not present

## 2019-01-19 DIAGNOSIS — E1122 Type 2 diabetes mellitus with diabetic chronic kidney disease: Secondary | ICD-10-CM | POA: Diagnosis not present

## 2019-01-19 DIAGNOSIS — I5032 Chronic diastolic (congestive) heart failure: Secondary | ICD-10-CM | POA: Diagnosis not present

## 2019-01-22 DIAGNOSIS — I130 Hypertensive heart and chronic kidney disease with heart failure and stage 1 through stage 4 chronic kidney disease, or unspecified chronic kidney disease: Secondary | ICD-10-CM | POA: Diagnosis not present

## 2019-01-22 DIAGNOSIS — E1122 Type 2 diabetes mellitus with diabetic chronic kidney disease: Secondary | ICD-10-CM | POA: Diagnosis not present

## 2019-01-22 DIAGNOSIS — C220 Liver cell carcinoma: Secondary | ICD-10-CM | POA: Diagnosis not present

## 2019-01-22 DIAGNOSIS — I38 Endocarditis, valve unspecified: Secondary | ICD-10-CM | POA: Diagnosis not present

## 2019-01-22 DIAGNOSIS — I442 Atrioventricular block, complete: Secondary | ICD-10-CM | POA: Diagnosis not present

## 2019-01-22 DIAGNOSIS — C7951 Secondary malignant neoplasm of bone: Secondary | ICD-10-CM | POA: Diagnosis not present

## 2019-01-23 ENCOUNTER — Inpatient Hospital Stay: Payer: Medicare Other

## 2019-01-23 ENCOUNTER — Inpatient Hospital Stay: Payer: Medicare Other | Admitting: Hematology & Oncology

## 2019-01-23 ENCOUNTER — Inpatient Hospital Stay: Payer: Medicare Other | Admitting: Infectious Diseases

## 2019-01-24 ENCOUNTER — Telehealth: Payer: Self-pay

## 2019-01-24 NOTE — Telephone Encounter (Signed)
Contacted wife to confirm appt with Dr. Sondra Come for tomorrow is to be canceled. Wife confirmed appt to be canceled as pt is under Hospice care. Loma Sousa, RN BSN

## 2019-01-25 ENCOUNTER — Ambulatory Visit: Admitting: Radiation Oncology

## 2019-02-07 DIAGNOSIS — E1122 Type 2 diabetes mellitus with diabetic chronic kidney disease: Secondary | ICD-10-CM | POA: Diagnosis not present

## 2019-02-07 DIAGNOSIS — I442 Atrioventricular block, complete: Secondary | ICD-10-CM | POA: Diagnosis not present

## 2019-02-07 DIAGNOSIS — C7951 Secondary malignant neoplasm of bone: Secondary | ICD-10-CM | POA: Diagnosis not present

## 2019-02-07 DIAGNOSIS — I130 Hypertensive heart and chronic kidney disease with heart failure and stage 1 through stage 4 chronic kidney disease, or unspecified chronic kidney disease: Secondary | ICD-10-CM | POA: Diagnosis not present

## 2019-02-07 DIAGNOSIS — C220 Liver cell carcinoma: Secondary | ICD-10-CM | POA: Diagnosis not present

## 2019-02-07 DIAGNOSIS — I38 Endocarditis, valve unspecified: Secondary | ICD-10-CM | POA: Diagnosis not present

## 2019-02-12 DIAGNOSIS — C220 Liver cell carcinoma: Secondary | ICD-10-CM | POA: Diagnosis not present

## 2019-02-12 DIAGNOSIS — E1122 Type 2 diabetes mellitus with diabetic chronic kidney disease: Secondary | ICD-10-CM | POA: Diagnosis not present

## 2019-02-12 DIAGNOSIS — C7951 Secondary malignant neoplasm of bone: Secondary | ICD-10-CM | POA: Diagnosis not present

## 2019-02-12 DIAGNOSIS — I38 Endocarditis, valve unspecified: Secondary | ICD-10-CM | POA: Diagnosis not present

## 2019-02-12 DIAGNOSIS — I442 Atrioventricular block, complete: Secondary | ICD-10-CM | POA: Diagnosis not present

## 2019-02-12 DIAGNOSIS — I130 Hypertensive heart and chronic kidney disease with heart failure and stage 1 through stage 4 chronic kidney disease, or unspecified chronic kidney disease: Secondary | ICD-10-CM | POA: Diagnosis not present

## 2019-02-14 ENCOUNTER — Telehealth: Payer: Self-pay | Admitting: Internal Medicine

## 2019-02-15 NOTE — Telephone Encounter (Signed)
Please advise.  Last OV 09/18/2018.  Patient has 4 canceled appointments (he was in the hospital for one of those) and one no show.

## 2019-02-15 NOTE — Telephone Encounter (Signed)
Noted. Let's refill and schedule a follow up. If he misses that, I may need to fire him. This can be virtual.

## 2019-02-15 NOTE — Telephone Encounter (Signed)
RX has been sent.

## 2019-02-15 NOTE — Telephone Encounter (Signed)
Pt is scheduled for VV tomorrow at 1pm

## 2019-02-16 ENCOUNTER — Other Ambulatory Visit: Payer: Self-pay

## 2019-02-16 ENCOUNTER — Encounter: Payer: Self-pay | Admitting: Internal Medicine

## 2019-02-16 ENCOUNTER — Ambulatory Visit (INDEPENDENT_AMBULATORY_CARE_PROVIDER_SITE_OTHER): Admitting: Internal Medicine

## 2019-02-16 DIAGNOSIS — N183 Chronic kidney disease, stage 3 unspecified: Secondary | ICD-10-CM

## 2019-02-16 DIAGNOSIS — I130 Hypertensive heart and chronic kidney disease with heart failure and stage 1 through stage 4 chronic kidney disease, or unspecified chronic kidney disease: Secondary | ICD-10-CM | POA: Diagnosis not present

## 2019-02-16 DIAGNOSIS — Z794 Long term (current) use of insulin: Secondary | ICD-10-CM

## 2019-02-16 DIAGNOSIS — E1122 Type 2 diabetes mellitus with diabetic chronic kidney disease: Secondary | ICD-10-CM | POA: Diagnosis not present

## 2019-02-16 DIAGNOSIS — E7800 Pure hypercholesterolemia, unspecified: Secondary | ICD-10-CM

## 2019-02-16 DIAGNOSIS — I442 Atrioventricular block, complete: Secondary | ICD-10-CM | POA: Diagnosis not present

## 2019-02-16 DIAGNOSIS — I38 Endocarditis, valve unspecified: Secondary | ICD-10-CM | POA: Diagnosis not present

## 2019-02-16 DIAGNOSIS — C7951 Secondary malignant neoplasm of bone: Secondary | ICD-10-CM | POA: Diagnosis not present

## 2019-02-16 DIAGNOSIS — C220 Liver cell carcinoma: Secondary | ICD-10-CM | POA: Diagnosis not present

## 2019-02-16 NOTE — Progress Notes (Signed)
Patient ID: Robert Gregory, male   DOB: 1941-09-15, 78 y.o.   MRN: [id number]  Patient location: Home My location: Office  Referring Provider: Seward Carol, MD  I connected with the patient on 02/16/19 at  1:04 PM EDT by a video enabled telemedicine application and verified that I am speaking with the correct person.   I discussed the limitations of evaluation and management by telemedicine and the availability of in person appointments. The patient expressed understanding and agreed to proceed.   Details of the encounter are shown below.  HPI: Robert Gregory is a 78 y.o.-year-old male, presenting for f/u for DM2, dx in 2004, insulin-dependent, uncontrolled, with complications (CAD - Ischemic CMP, s/p CABG x5 in 1997, CHF; CKD stage 3). Last visit almost 6 months ago.  Since last visit, patient was diagnosed with hepatocellular carcinoma.  This has metastasized to the spine (for which he had radiotherapy) and has extended in both lobes of the liver.  He will not pursue chemotherapy/immunotherapy.  He is on hospice.  On Decadron 4 mg daily. On soft foods + glucerna.  While in the hospital, he was taken off his U500 insulin mealtime insulin and placed only on sliding scale.  Sugars are higher.  Last hemoglobin A1c was: Lab Results  Component Value Date   HGBA1C 7.0 (A) 08/24/2018   HGBA1C 7.8 (A) 05/15/2018   HGBA1C 8.0 (A) 02/17/2018   Patient is on: - Metformin ER 2000 mg with dinner >> stopped >> restarted 1000 mg with Lunch - U500 insulin: 150-200: no insulin; 200-250: 8 units; 250-300: 12 units. etc  He was on Cinnamon 500 mg 2x a day He gained 50 lbs with Actos in the past.  He was on Invokana 100 mg in am in the past. We stopped Bydureon at last visit 2/2 severe constipation >> this resolved. He was previously on Lantus at bedtime, along with mealtime U500 doses, now off.  Pt checks his sugars 2-3 times a day: - am:71, 85-287 >> 78-173 >> 196-228 - 2h after b'fast: 141-254  >> 134, 261 >> n/c - before lunch: 134, 178-284 >> 133 >> ? - 2h after lunch: 222-329 >> 229, 257 >> ? - before dinner: 108-322 >>136-192, 303 >> 321, 400 - 2h after dinner:  134-255, 490 >> 98-210 >> ? - bedtime: 96-230, 336 >> 141-235, 301 >> 384, 400 - nighttime: 84-319 >> 120-183 >> n/c Lowest sugar: 71 >> 78 >> 196; he has hypoglycemia awareness in the low 100s. Highest sugar: 490 >> 324 >> 400.  Glucometer: [employer] 2  Pt's meals are: - Breakfast: cereals + 2% milk, sausage + eggs + toast; still OJ - Lunch: soup + sandwich (lightest) - Dinner: meat + veggies + starch  - Snacks: 1- pm: peanuts, V8 juice  -+ CKD, last BUN/creatinine:  Lab Results  Component Value Date   BUN 27 (H) 12/28/2018   CREATININE 0.80 12/28/2018  On Benicar.  His GFR levels were normal: Lab Results  Component Value Date   GFRNONAA >60 12/28/2018   GFRNONAA >60 12/27/2018   GFRNONAA >60 12/26/2018   GFRNONAA >60 12/26/2018   GFRNONAA >60 12/25/2018   GFRNONAA >60 12/24/2018   GFRNONAA >60 12/23/2018   GFRNONAA >60 12/22/2018   GFRNONAA >60 12/21/2018   GFRNONAA >60 12/20/2018   -+ HL; last set of lipids: 07/2018: tChol 115 Lab Results  Component Value Date   CHOL 123 08/03/2017   HDL 26 (L) 08/03/2017   LDLCALC 26 08/03/2017  TRIG 355 (H) 08/03/2017   CHOLHDL 4.7 08/03/2017  On pravastatin 80, generic Lovaza. - last eye exam was in 12/2017: No DR -He denies numbness and tingling in his feet.  He has a history of a right diabetic foot ulcer developed after removal of a callus >> followed by Dr. Zigmund Daniel (Wound Care).  This has healed.  He started iron IV and B12 im.  He was advised to stop Spironolactone and Lasix in the hospital.  He is still off, bending recommendations from his cardiologist.  ROS: Constitutional: no weight gain/+ weight loss, no fatigue, no subjective hyperthermia, no subjective hypothermia Eyes: no blurry vision, no xerophthalmia ENT: no sore throat, no  nodules palpated in neck, no dysphagia, no odynophagia, no hoarseness Cardiovascular: no CP/no SOB/no palpitations/no leg swelling Respiratory: no cough/no SOB/no wheezing Gastrointestinal: no N/no V/no D/no C/no acid reflux/+ abdominal pain Musculoskeletal: no muscle aches/no joint aches Skin: no rashes, no hair loss Neurological: no tremors/no numbness/no tingling/no dizziness  I reviewed pt's medications, allergies, PMH, social hx, family hx, and changes were documented in the history of present illness. Otherwise, unchanged from my initial visit note.  Past Medical History:  Diagnosis Date  . Arthritis   . Atrial fibrillation ([place])    takes Xarelto daily  . Cellulitis    RLE  . Cholelithiasis   . Chronic back pain    scoliosis/arthritis  . Complication of anesthesia    forgetful for about 2-3wks after anesthesia  . Hepatocellular carcinoma ([place])   . History of colon polyps   . Hyperlipidemia    takes [employer] daily  . Hypertension    takes Benicar and Coreg daily  . ICD (implantable cardioverter-defibrillator) in place 11/07/2013  . Iron deficiency anemia due to chronic blood loss 08/24/2018  . Iron malabsorption 08/24/2018  . Ischemic cardiomyopathy   . Myocardial infarction ([place]) 1997   on the OR table   . Nephrolithiasis   . Obstructive sleep apnea    CPAP  . Peripheral neuropathy   . Pernicious anemia 08/24/2018  . Spinal stenosis   . Thrombocytopenia ([place])   . Total knee replacement status 11/29/2014  . Type 2 diabetes mellitus with stage 3 chronic kidney disease ([place]) 05/19/2015   Past Surgical History:  Procedure Laterality Date  . CARDIAC CATHETERIZATION N/A 10/02/2015   Procedure: Left Heart Cath and Cors/Grafts Angiography;  Surgeon: Belva Crome, MD;  Location: [facility];  Service: Cardiovascular;  Laterality: N/A;  . COLONOSCOPY    . coronary artery bypass and graft  1997   x 5  . ESOPHAGOGASTRODUODENOSCOPY    . ICD placed     . IR IMAGING GUIDED PORT INSERTION  12/19/2018  . IR PARACENTESIS  01/04/2019  . IR PARACENTESIS  01/10/2019  . IR REMOVAL TUN ACCESS W/ PORT W/O FL MOD SED  12/25/2018  . LAMINECTOMY    . LITHOTRIPSY    . Right rotator cuff surgery    . TEE WITHOUT CARDIOVERSION N/A 12/26/2018   Procedure: TRANSESOPHAGEAL ECHOCARDIOGRAM (TEE);  Surgeon: Larey Dresser, MD;  Location: [facility];  Service: Cardiovascular;  Laterality: N/A;  . TONSILLECTOMY     adenoids  . TOTAL KNEE ARTHROPLASTY Left   . TOTAL KNEE ARTHROPLASTY Right 11/29/2014   Procedure: RIGHT TOTAL KNEE ARTHROPLASTY;  Surgeon: Leandrew Koyanagi, MD;  Location: [facility];  Service: Orthopedics;  Laterality: Right;   Social History   Social History  . Marital status: Married    Spouse name: N/A  .  Number of children: N/A   Occupational History  .      Retired from [employer]  . Smoking status: Never Smoker  . Smokeless tobacco: Never Used  . Alcohol use 0.0 oz/week     Comment: once drink a month  . Drug use: No   Current Outpatient Medications on File Prior to Visit  Medication Sig Dispense Refill  . calcitonin, salmon, (MIACALCIN/FORTICAL) 200 UNIT/ACT nasal spray Place 1 spray into alternate nostrils daily. 3.7 mL 12  . Coenzyme Q10 200 MG capsule Take 200 mg by mouth daily with supper.     . ezetimibe (ZETIA) 10 MG tablet TAKE 1 TABLET DAILY (Patient taking differently: Take 10 mg by mouth daily. ) 90 tablet 4  . feeding supplement, GLUCERNA SHAKE, (GLUCERNA SHAKE) LIQD Take 237 mLs by mouth 3 (three) times daily between meals.  0  . fentaNYL (DURAGESIC) 25 MCG/HR Place 1 patch onto the skin every 3 (three) days. 10 patch 0  . gabapentin (NEURONTIN) 400 MG capsule Take 1 capsule (400 mg total) by mouth 3 (three) times daily. 90 capsule 0  . insulin regular human CONCENTRATED (HUMULIN R) 500 UNIT/ML injection Inject up to 200 units a day as advised (Patient taking differently: Inject 35-150 Units into  the skin See admin instructions. Inject 150 units subcutaneously with breakfast (drawn on insulin syringe) and 70 units with lunch. For CBG 100-120 decrease to 1/2 dose (75 units with breakfast and 35 units with lunch. If CBG <100 hold insulin.) 3 vial 3  . Insulin Syringe/Needle U-500 (BD INSULIN SYRINGE U-500) 31G X 6MM 0.5 ML MISC Inject 1 each into the skin 2 (two) times daily with a meal. Need appointment for further refills. 300 each 0  . lactulose (CHRONULAC) 10 GM/15ML solution Take 15 mLs (10 g total) by mouth 2 (two) times daily. 236 mL 0  . Levomefolate Glucosamine (METHYLFOLATE PO) Take 2,000 mcg by mouth daily with supper.    . lidocaine (LIDODERM) 5 % Place 1 patch onto the skin daily. Remove & Discard patch within 12 hours or as directed by MD 30 patch 0  . metFORMIN (GLUCOPHAGE-XR) 500 MG 24 hr tablet Take 1000 mg with lunch (Patient taking differently: Take 1,000 mg by mouth daily with lunch. ) 180 tablet 3  . methocarbamol (ROBAXIN) 750 MG tablet Take 375 mg by mouth every 8 (eight) hours as needed for muscle spasms (back pain).     . Multiple Vitamin (MULTIVITAMIN WITH MINERALS) TABS tablet Take 1 tablet by mouth daily. Centrum Silver    . naloxone [facility]) 2 MG/2ML injection Inject 1 mL (1 mg total) into the vein as needed. 2 mL 0  . nitroGLYCERIN (NITROSTAT) 0.4 MG SL tablet Place 1 tablet (0.4 mg total) under the tongue every 5 (five) minutes as needed for chest pain. 75 tablet 3  . nystatin (MYCOSTATIN) 100000 UNIT/ML suspension Take 5 mLs (500,000 Units total) by mouth 4 (four) times daily. 60 mL 0  . omega-3 acid ethyl esters (LOVAZA) 1 g capsule TAKE 2 CAPSULES TWICE A DAY (Patient taking differently: Take 2 g by mouth 2 (two) times daily. ) 360 capsule 1  . oxyCODONE (OXY IR/ROXICODONE) 5 MG immediate release tablet Take 1 tablet (5 mg total) by mouth every 4 (four) hours as needed for severe pain. 60 tablet 0  . Peppermint Oil (IBGARD) 90 MG CPCR Take 90 mg by mouth 2 (two)  times daily.    Marland Kitchen  potassium chloride (K-DUR) 10 MEQ tablet Take 10 mEq by mouth daily with breakfast.     . Probiotic Product (PROBIOTIC DAILY PO) Take 1 tablet by mouth 2 (two) times daily.     Marland Kitchen senna-docusate (SENOKOT-S) 8.6-50 MG tablet Take 2 tablets by mouth 2 (two) times daily.     No current facility-administered medications on file prior to visit.    Allergies  Allergen Reactions  . Feraheme [Ferumoxytol] Other (See Comments)    Patient had  reaction during infusion in Feb 2020 decision made to change to [facility] (passed out)  . Contrast Media [Iodinated Diagnostic Agents] Rash and Other (See Comments)    Extreme flushing  . Iodine Other (See Comments)    flushing  . Ioxaglate Other (See Comments)    Extreme flushing  . Adhesive [Tape] Rash    Adhesive tapes causes redness/itching/rash after removal  . Seasonal Ic [Cholestatin] Other (See Comments)    Unknown reaction   Family History  Problem Relation Age of Onset  . Pneumonia Mother   . Heart disease Other        No family history   PE: There were no vitals taken for this visit. There is no height or weight on file to calculate BMI. Wt Readings from Last 3 Encounters:  12/21/18 (!) 303 lb 2.1 oz (137.5 kg)  11/23/18 (!) 314 lb 6.4 oz (142.6 kg)  11/14/18 (!) 310 lb (140.6 kg)   Constitutional:  in NAD  The physical exam was not performed (virtual visit).  ASSESSMENT: 1. DM2, insulin-dependent, uncontrolled, with complications - CAD, s/p CABG 5x in 1997 (Dr Roxy Horseman), iCMP >> CHF (Dr Stanford Breed) - CKD stage 3   2. Obesity class 3  3. HL  PLAN:  1. Patient with longstanding, uncontrolled, type 2 diabetes, very insulin resistant, on U500 insulin and now back on metformin.  At last visit, he was taken off metformin in the hospital where he was admitted for cellulitis and was on IV antibiotics.   -Since last visit, he was diagnosed with liver cancer, metastatic to the spine.  He he is now on dexamethasone.   Sugars are much higher, up to 400s.  In the hospital he was taken off his mealtime insulin and started only on U500 sliding scale.  We discussed that this is usually not sufficient to control sugars and we discussed about starting a low-dose U500 insulin with meals, except dinner, as he usually has a tendency of dropping his sugars overnight.  I advised him to keep in touch with me as we will most likely need to switch him to Lantus +/- rapid acting insulin soon. -Due to his liver condition and elevated transaminases, will need to stop metformin - I advised him to:  Patient Instructions  Please stop: - Metformin   Please change: - U500 insulin: -10-12 units before breakfast -10-12 units before lunch  -no insulin before dinner  Please return in 3 months with your sugar log.  - we we will check another HbA1c when he returns to the clinic - continue checking sugars at different times of the day - check 3x a day, rotating checks - advised for yearly eye exams >> he is not UTD - Return to clinic in 3-4 mo with sugar log        2. Obesity class 3 -Reviewed gastric bypass surgery -Unfortunately, we do not continue GLP-1 receptor agonist due to severe constipation and SGLT 2 inhibitor due to dehydration and low GFR. -At last  visit, we added back metformin, which should help with weight stabilization. -He lost more than 20 pounds since last visit, however, unfortunately, most likely also contributed by his [place] diagnosis.  3. HL - Reviewed latest lipid panel from 2018: Triglycerides high, HDL low, LDL excellent. Lab Results  Component Value Date   CHOL 123 08/03/2017   HDL 26 (L) 08/03/2017   LDLCALC 26 08/03/2017   TRIG 355 (H) 08/03/2017   CHOLHDL 4.7 08/03/2017  - Continues high-dose pravastatin and generic Lovaza without side effects.  Philemon Kingdom, MD PhD [facility] Endocrinology

## 2019-02-16 NOTE — Patient Instructions (Addendum)
Please stop: - Metformin   Please change: - U500 insulin: -10-12 units before breakfast -10-12 units before lunch  -no insulin before dinner  Please return in 3 months with your sugar log.

## 2019-02-19 DIAGNOSIS — I442 Atrioventricular block, complete: Secondary | ICD-10-CM | POA: Diagnosis not present

## 2019-02-19 DIAGNOSIS — M1990 Unspecified osteoarthritis, unspecified site: Secondary | ICD-10-CM | POA: Diagnosis not present

## 2019-02-19 DIAGNOSIS — G4733 Obstructive sleep apnea (adult) (pediatric): Secondary | ICD-10-CM | POA: Diagnosis not present

## 2019-02-19 DIAGNOSIS — C220 Liver cell carcinoma: Secondary | ICD-10-CM | POA: Diagnosis not present

## 2019-02-19 DIAGNOSIS — I2510 Atherosclerotic heart disease of native coronary artery without angina pectoris: Secondary | ICD-10-CM | POA: Diagnosis not present

## 2019-02-19 DIAGNOSIS — C7951 Secondary malignant neoplasm of bone: Secondary | ICD-10-CM | POA: Diagnosis not present

## 2019-02-19 DIAGNOSIS — E1122 Type 2 diabetes mellitus with diabetic chronic kidney disease: Secondary | ICD-10-CM | POA: Diagnosis not present

## 2019-02-19 DIAGNOSIS — L03115 Cellulitis of right lower limb: Secondary | ICD-10-CM | POA: Diagnosis not present

## 2019-02-19 DIAGNOSIS — I38 Endocarditis, valve unspecified: Secondary | ICD-10-CM | POA: Diagnosis not present

## 2019-02-19 DIAGNOSIS — I5032 Chronic diastolic (congestive) heart failure: Secondary | ICD-10-CM | POA: Diagnosis not present

## 2019-02-19 DIAGNOSIS — Z951 Presence of aortocoronary bypass graft: Secondary | ICD-10-CM | POA: Diagnosis not present

## 2019-02-19 DIAGNOSIS — N183 Chronic kidney disease, stage 3 (moderate): Secondary | ICD-10-CM | POA: Diagnosis not present

## 2019-02-19 DIAGNOSIS — I4891 Unspecified atrial fibrillation: Secondary | ICD-10-CM | POA: Diagnosis not present

## 2019-02-19 DIAGNOSIS — I130 Hypertensive heart and chronic kidney disease with heart failure and stage 1 through stage 4 chronic kidney disease, or unspecified chronic kidney disease: Secondary | ICD-10-CM | POA: Diagnosis not present

## 2019-02-26 DIAGNOSIS — E1122 Type 2 diabetes mellitus with diabetic chronic kidney disease: Secondary | ICD-10-CM | POA: Diagnosis not present

## 2019-02-26 DIAGNOSIS — I38 Endocarditis, valve unspecified: Secondary | ICD-10-CM | POA: Diagnosis not present

## 2019-02-26 DIAGNOSIS — I130 Hypertensive heart and chronic kidney disease with heart failure and stage 1 through stage 4 chronic kidney disease, or unspecified chronic kidney disease: Secondary | ICD-10-CM | POA: Diagnosis not present

## 2019-02-26 DIAGNOSIS — C7951 Secondary malignant neoplasm of bone: Secondary | ICD-10-CM | POA: Diagnosis not present

## 2019-02-26 DIAGNOSIS — C220 Liver cell carcinoma: Secondary | ICD-10-CM | POA: Diagnosis not present

## 2019-02-26 DIAGNOSIS — I442 Atrioventricular block, complete: Secondary | ICD-10-CM | POA: Diagnosis not present

## 2019-02-27 DIAGNOSIS — C7951 Secondary malignant neoplasm of bone: Secondary | ICD-10-CM | POA: Diagnosis not present

## 2019-02-27 DIAGNOSIS — I38 Endocarditis, valve unspecified: Secondary | ICD-10-CM | POA: Diagnosis not present

## 2019-02-27 DIAGNOSIS — I130 Hypertensive heart and chronic kidney disease with heart failure and stage 1 through stage 4 chronic kidney disease, or unspecified chronic kidney disease: Secondary | ICD-10-CM | POA: Diagnosis not present

## 2019-02-27 DIAGNOSIS — I442 Atrioventricular block, complete: Secondary | ICD-10-CM | POA: Diagnosis not present

## 2019-02-27 DIAGNOSIS — C220 Liver cell carcinoma: Secondary | ICD-10-CM | POA: Diagnosis not present

## 2019-02-27 DIAGNOSIS — E1122 Type 2 diabetes mellitus with diabetic chronic kidney disease: Secondary | ICD-10-CM | POA: Diagnosis not present

## 2019-02-28 DIAGNOSIS — I38 Endocarditis, valve unspecified: Secondary | ICD-10-CM | POA: Diagnosis not present

## 2019-02-28 DIAGNOSIS — E1122 Type 2 diabetes mellitus with diabetic chronic kidney disease: Secondary | ICD-10-CM | POA: Diagnosis not present

## 2019-02-28 DIAGNOSIS — C7951 Secondary malignant neoplasm of bone: Secondary | ICD-10-CM | POA: Diagnosis not present

## 2019-02-28 DIAGNOSIS — I442 Atrioventricular block, complete: Secondary | ICD-10-CM | POA: Diagnosis not present

## 2019-02-28 DIAGNOSIS — C220 Liver cell carcinoma: Secondary | ICD-10-CM | POA: Diagnosis not present

## 2019-02-28 DIAGNOSIS — I130 Hypertensive heart and chronic kidney disease with heart failure and stage 1 through stage 4 chronic kidney disease, or unspecified chronic kidney disease: Secondary | ICD-10-CM | POA: Diagnosis not present

## 2019-03-02 DIAGNOSIS — I130 Hypertensive heart and chronic kidney disease with heart failure and stage 1 through stage 4 chronic kidney disease, or unspecified chronic kidney disease: Secondary | ICD-10-CM | POA: Diagnosis not present

## 2019-03-02 DIAGNOSIS — C7951 Secondary malignant neoplasm of bone: Secondary | ICD-10-CM | POA: Diagnosis not present

## 2019-03-02 DIAGNOSIS — I38 Endocarditis, valve unspecified: Secondary | ICD-10-CM | POA: Diagnosis not present

## 2019-03-02 DIAGNOSIS — E1122 Type 2 diabetes mellitus with diabetic chronic kidney disease: Secondary | ICD-10-CM | POA: Diagnosis not present

## 2019-03-02 DIAGNOSIS — I442 Atrioventricular block, complete: Secondary | ICD-10-CM | POA: Diagnosis not present

## 2019-03-02 DIAGNOSIS — C220 Liver cell carcinoma: Secondary | ICD-10-CM | POA: Diagnosis not present

## 2019-03-03 DIAGNOSIS — I442 Atrioventricular block, complete: Secondary | ICD-10-CM | POA: Diagnosis not present

## 2019-03-03 DIAGNOSIS — I38 Endocarditis, valve unspecified: Secondary | ICD-10-CM | POA: Diagnosis not present

## 2019-03-03 DIAGNOSIS — I130 Hypertensive heart and chronic kidney disease with heart failure and stage 1 through stage 4 chronic kidney disease, or unspecified chronic kidney disease: Secondary | ICD-10-CM | POA: Diagnosis not present

## 2019-03-03 DIAGNOSIS — C7951 Secondary malignant neoplasm of bone: Secondary | ICD-10-CM | POA: Diagnosis not present

## 2019-03-03 DIAGNOSIS — C220 Liver cell carcinoma: Secondary | ICD-10-CM | POA: Diagnosis not present

## 2019-03-03 DIAGNOSIS — E1122 Type 2 diabetes mellitus with diabetic chronic kidney disease: Secondary | ICD-10-CM | POA: Diagnosis not present

## 2019-03-04 DIAGNOSIS — I442 Atrioventricular block, complete: Secondary | ICD-10-CM | POA: Diagnosis not present

## 2019-03-04 DIAGNOSIS — C220 Liver cell carcinoma: Secondary | ICD-10-CM | POA: Diagnosis not present

## 2019-03-04 DIAGNOSIS — C7951 Secondary malignant neoplasm of bone: Secondary | ICD-10-CM | POA: Diagnosis not present

## 2019-03-04 DIAGNOSIS — I130 Hypertensive heart and chronic kidney disease with heart failure and stage 1 through stage 4 chronic kidney disease, or unspecified chronic kidney disease: Secondary | ICD-10-CM | POA: Diagnosis not present

## 2019-03-04 DIAGNOSIS — I38 Endocarditis, valve unspecified: Secondary | ICD-10-CM | POA: Diagnosis not present

## 2019-03-04 DIAGNOSIS — E1122 Type 2 diabetes mellitus with diabetic chronic kidney disease: Secondary | ICD-10-CM | POA: Diagnosis not present

## 2019-03-05 DIAGNOSIS — I442 Atrioventricular block, complete: Secondary | ICD-10-CM | POA: Diagnosis not present

## 2019-03-05 DIAGNOSIS — I38 Endocarditis, valve unspecified: Secondary | ICD-10-CM | POA: Diagnosis not present

## 2019-03-05 DIAGNOSIS — E1122 Type 2 diabetes mellitus with diabetic chronic kidney disease: Secondary | ICD-10-CM | POA: Diagnosis not present

## 2019-03-05 DIAGNOSIS — C220 Liver cell carcinoma: Secondary | ICD-10-CM | POA: Diagnosis not present

## 2019-03-05 DIAGNOSIS — I130 Hypertensive heart and chronic kidney disease with heart failure and stage 1 through stage 4 chronic kidney disease, or unspecified chronic kidney disease: Secondary | ICD-10-CM | POA: Diagnosis not present

## 2019-03-05 DIAGNOSIS — C7951 Secondary malignant neoplasm of bone: Secondary | ICD-10-CM | POA: Diagnosis not present

## 2019-03-06 DIAGNOSIS — E1122 Type 2 diabetes mellitus with diabetic chronic kidney disease: Secondary | ICD-10-CM | POA: Diagnosis not present

## 2019-03-06 DIAGNOSIS — C7951 Secondary malignant neoplasm of bone: Secondary | ICD-10-CM | POA: Diagnosis not present

## 2019-03-06 DIAGNOSIS — I38 Endocarditis, valve unspecified: Secondary | ICD-10-CM | POA: Diagnosis not present

## 2019-03-06 DIAGNOSIS — I442 Atrioventricular block, complete: Secondary | ICD-10-CM | POA: Diagnosis not present

## 2019-03-06 DIAGNOSIS — I130 Hypertensive heart and chronic kidney disease with heart failure and stage 1 through stage 4 chronic kidney disease, or unspecified chronic kidney disease: Secondary | ICD-10-CM | POA: Diagnosis not present

## 2019-03-06 DIAGNOSIS — C220 Liver cell carcinoma: Secondary | ICD-10-CM | POA: Diagnosis not present

## 2019-03-07 DIAGNOSIS — C7951 Secondary malignant neoplasm of bone: Secondary | ICD-10-CM | POA: Diagnosis not present

## 2019-03-07 DIAGNOSIS — I442 Atrioventricular block, complete: Secondary | ICD-10-CM | POA: Diagnosis not present

## 2019-03-07 DIAGNOSIS — C220 Liver cell carcinoma: Secondary | ICD-10-CM | POA: Diagnosis not present

## 2019-03-07 DIAGNOSIS — I130 Hypertensive heart and chronic kidney disease with heart failure and stage 1 through stage 4 chronic kidney disease, or unspecified chronic kidney disease: Secondary | ICD-10-CM | POA: Diagnosis not present

## 2019-03-07 DIAGNOSIS — E1122 Type 2 diabetes mellitus with diabetic chronic kidney disease: Secondary | ICD-10-CM | POA: Diagnosis not present

## 2019-03-07 DIAGNOSIS — I38 Endocarditis, valve unspecified: Secondary | ICD-10-CM | POA: Diagnosis not present

## 2019-03-08 DIAGNOSIS — C7951 Secondary malignant neoplasm of bone: Secondary | ICD-10-CM | POA: Diagnosis not present

## 2019-03-08 DIAGNOSIS — I38 Endocarditis, valve unspecified: Secondary | ICD-10-CM | POA: Diagnosis not present

## 2019-03-08 DIAGNOSIS — C220 Liver cell carcinoma: Secondary | ICD-10-CM | POA: Diagnosis not present

## 2019-03-08 DIAGNOSIS — I130 Hypertensive heart and chronic kidney disease with heart failure and stage 1 through stage 4 chronic kidney disease, or unspecified chronic kidney disease: Secondary | ICD-10-CM | POA: Diagnosis not present

## 2019-03-08 DIAGNOSIS — E1122 Type 2 diabetes mellitus with diabetic chronic kidney disease: Secondary | ICD-10-CM | POA: Diagnosis not present

## 2019-03-08 DIAGNOSIS — I442 Atrioventricular block, complete: Secondary | ICD-10-CM | POA: Diagnosis not present

## 2019-03-09 DIAGNOSIS — I38 Endocarditis, valve unspecified: Secondary | ICD-10-CM | POA: Diagnosis not present

## 2019-03-09 DIAGNOSIS — I442 Atrioventricular block, complete: Secondary | ICD-10-CM | POA: Diagnosis not present

## 2019-03-09 DIAGNOSIS — C7951 Secondary malignant neoplasm of bone: Secondary | ICD-10-CM | POA: Diagnosis not present

## 2019-03-09 DIAGNOSIS — I130 Hypertensive heart and chronic kidney disease with heart failure and stage 1 through stage 4 chronic kidney disease, or unspecified chronic kidney disease: Secondary | ICD-10-CM | POA: Diagnosis not present

## 2019-03-09 DIAGNOSIS — C220 Liver cell carcinoma: Secondary | ICD-10-CM | POA: Diagnosis not present

## 2019-03-09 DIAGNOSIS — E1122 Type 2 diabetes mellitus with diabetic chronic kidney disease: Secondary | ICD-10-CM | POA: Diagnosis not present

## 2019-03-10 DIAGNOSIS — E1122 Type 2 diabetes mellitus with diabetic chronic kidney disease: Secondary | ICD-10-CM | POA: Diagnosis not present

## 2019-03-10 DIAGNOSIS — I38 Endocarditis, valve unspecified: Secondary | ICD-10-CM | POA: Diagnosis not present

## 2019-03-10 DIAGNOSIS — I130 Hypertensive heart and chronic kidney disease with heart failure and stage 1 through stage 4 chronic kidney disease, or unspecified chronic kidney disease: Secondary | ICD-10-CM | POA: Diagnosis not present

## 2019-03-10 DIAGNOSIS — C7951 Secondary malignant neoplasm of bone: Secondary | ICD-10-CM | POA: Diagnosis not present

## 2019-03-10 DIAGNOSIS — I442 Atrioventricular block, complete: Secondary | ICD-10-CM | POA: Diagnosis not present

## 2019-03-10 DIAGNOSIS — C220 Liver cell carcinoma: Secondary | ICD-10-CM | POA: Diagnosis not present

## 2019-03-12 ENCOUNTER — Telehealth: Payer: Self-pay | Admitting: *Deleted

## 2019-03-12 NOTE — Telephone Encounter (Signed)
Call received from the [facility] to inform [name] that patient passed away on 04-09-19.  [name] notified.

## 2019-03-14 ENCOUNTER — Telehealth: Payer: Self-pay | Admitting: *Deleted

## 2019-03-14 NOTE — Telephone Encounter (Signed)
A message was left, re: follow up visit. 

## 2019-03-21 DEATH — deceased

## 2019-06-08 ENCOUNTER — Ambulatory Visit: Payer: TRICARE For Life (TFL) | Admitting: Internal Medicine

## 2019-10-05 IMAGING — US ULTRASOUND ABDOMEN LIMITED
1 series · 5 of 5 positions shown · non-contrast
Comparison: Right upper quadrant ultrasound 12/21/2018

CLINICAL DATA: Abdominal distension.

EXAM:
LIMITED ABDOMEN ULTRASOUND FOR ASCITES
TECHNIQUE: Limited ultrasound survey for ascites was performed in all four
abdominal quadrants.

[Series 1: ultrasound abdomen limited · 5 of 5 slices shown]
[im 1/5]
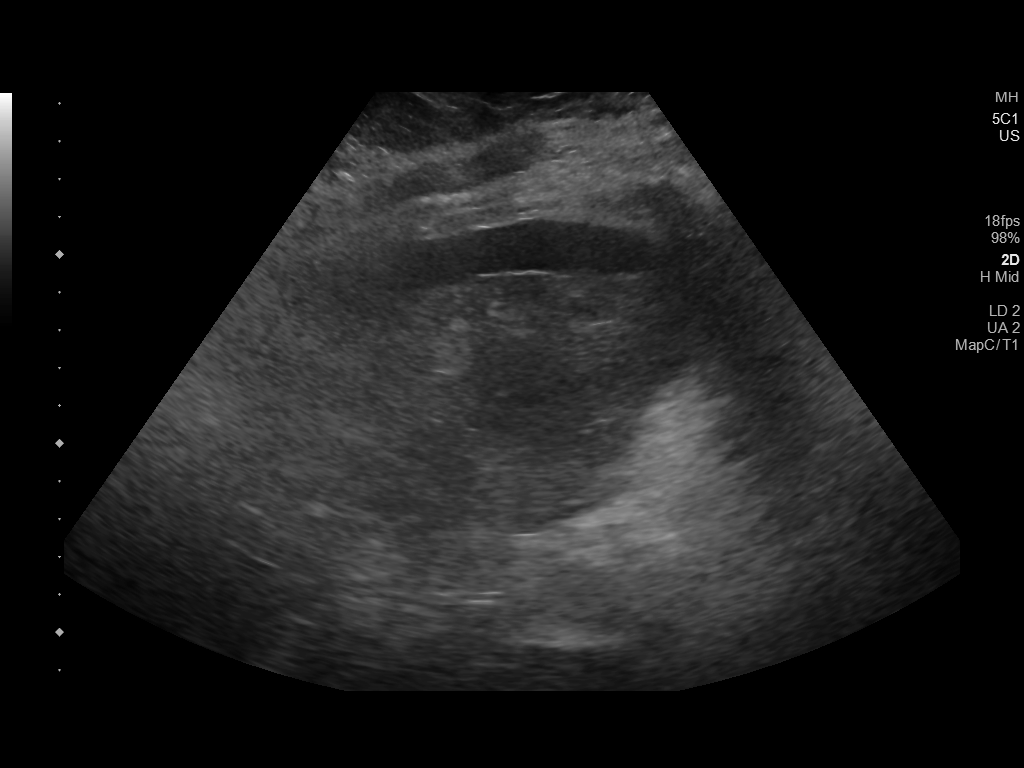
[im 2/5]
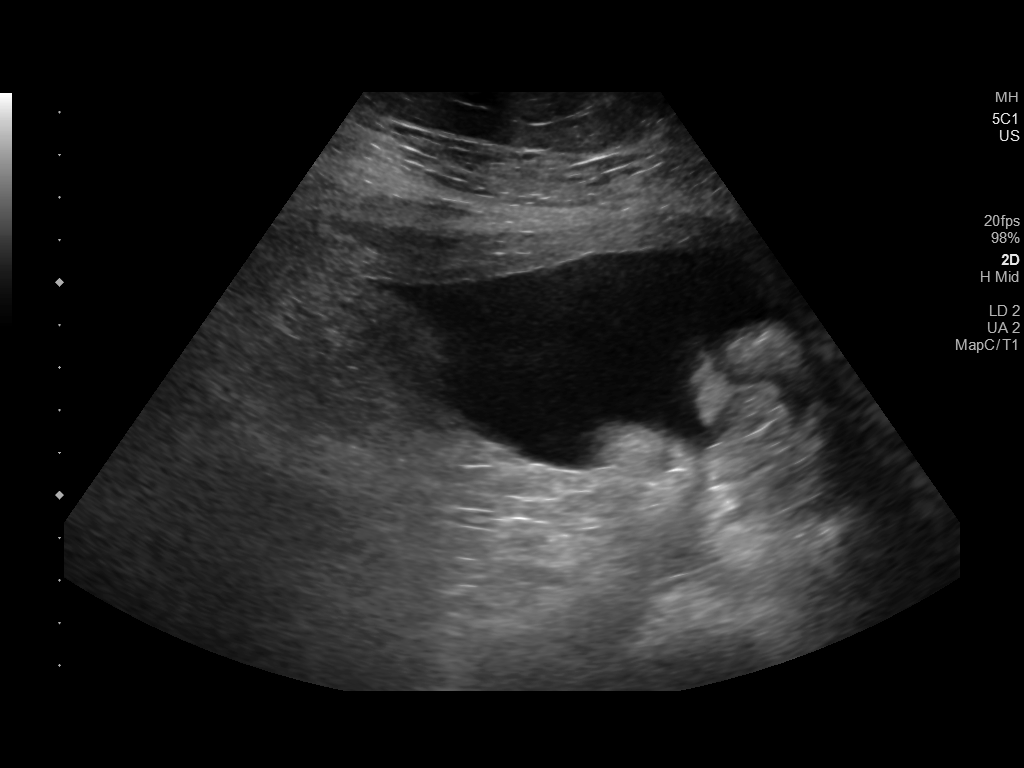
[im 3/5]
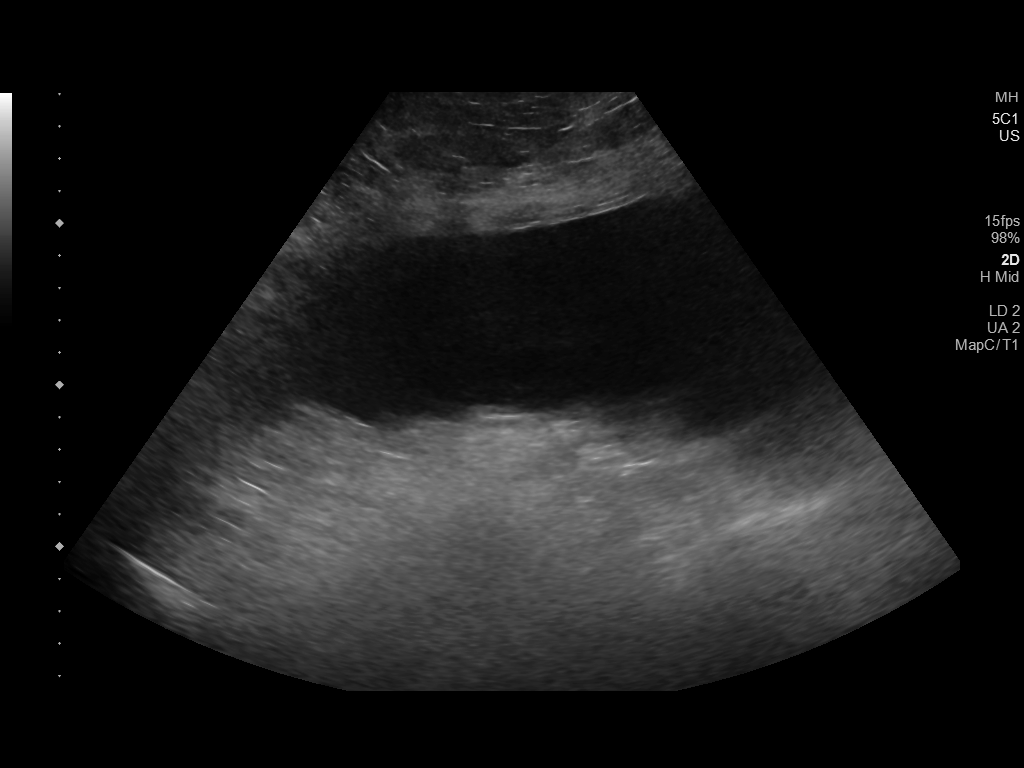
[im 4/5]
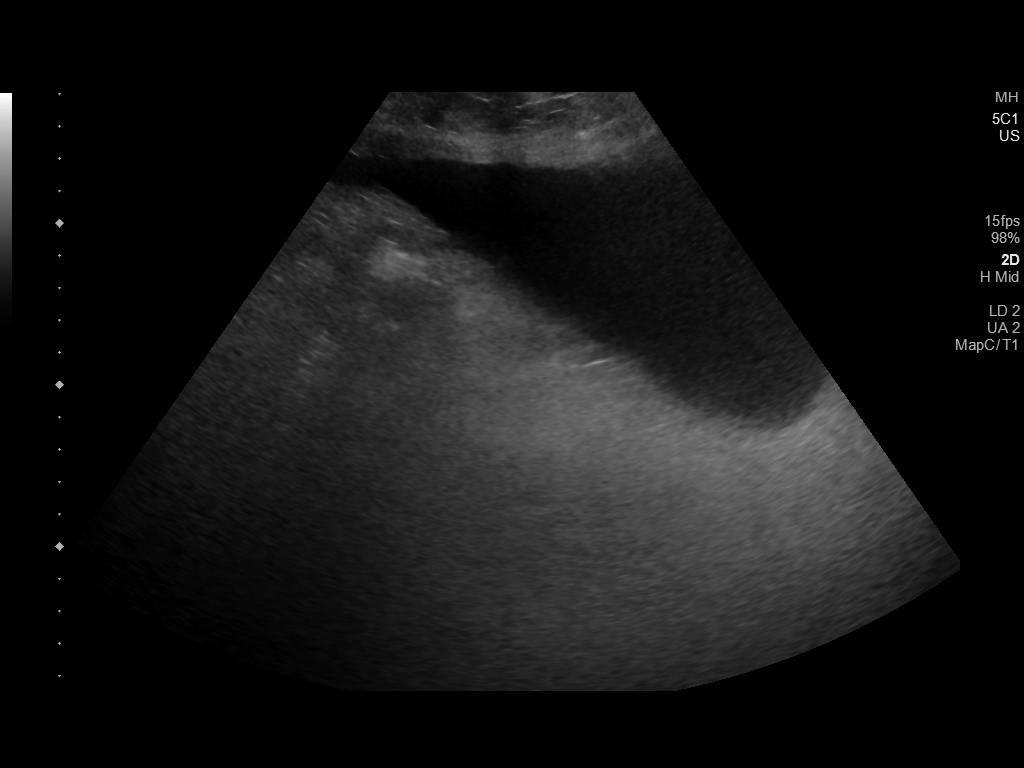
[im 5/5]
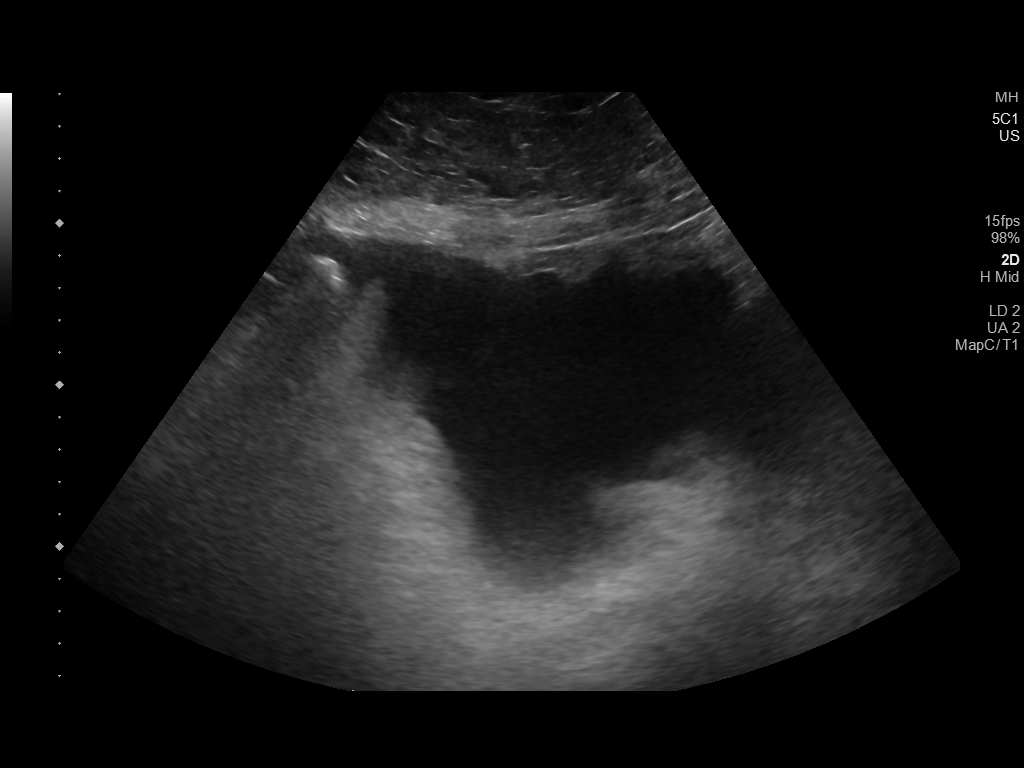

[5 of 5 positions shown; findings below may reference images not displayed]

FINDINGS: Moderate volume of ascites in all 4 quadrants, similar to slightly
increased from prior ultrasound.
IMPRESSION: Moderate volume intra-abdominal ascites.

## 2019-10-06 IMAGING — DX PORTABLE CHEST - 1 VIEW
1 series · 1 of 1 positions shown · non-contrast
Comparison: 12/22/2018

CLINICAL DATA: PICC line placement

EXAM:
PORTABLE CHEST 1 VIEW

[chest ap]
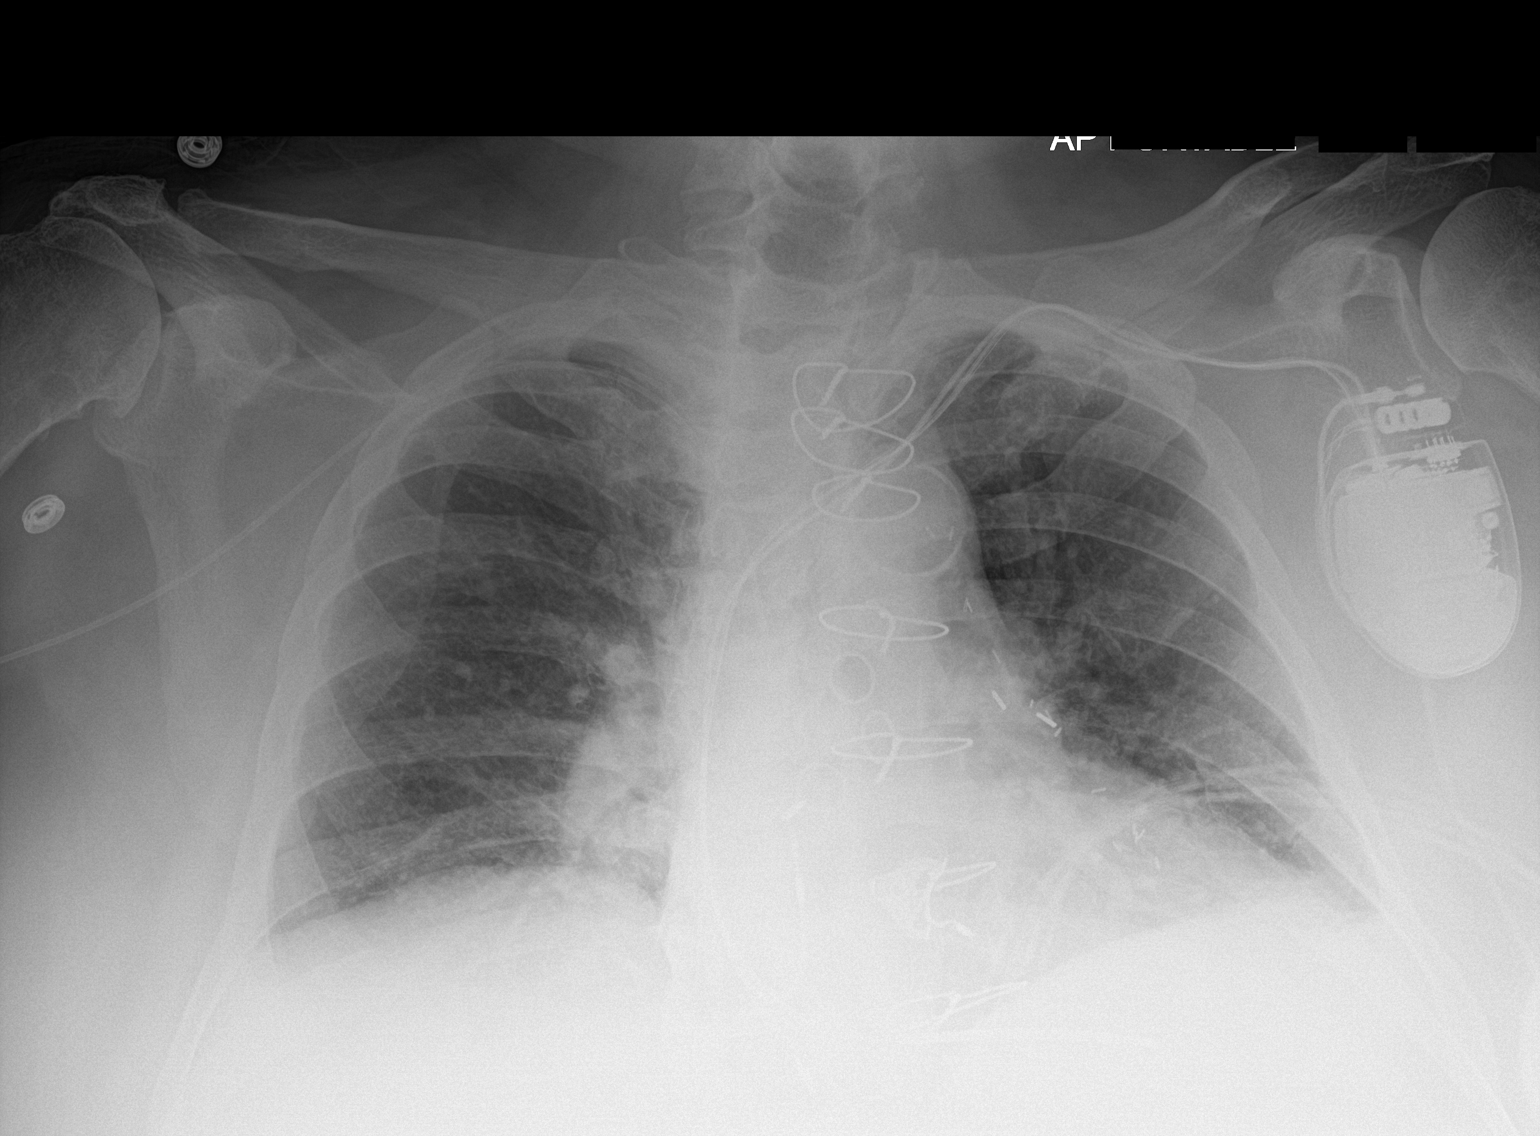

[1 of 1 positions shown; findings below may reference images not displayed]

FINDINGS: Removal of the previously seen right Port-A-Cath. Left pacer remains
in place, unchanged. Interval placement of right PICC line with the
tip in the SVC. Prior CABG. Cardiomegaly with vascular congestion
and bibasilar atelectasis.
IMPRESSION: Interval removal of right side Port-A-Cath and placement of right
PICC line with the tip in the SVC.

Cardiomegaly, vascular congestion.  Bibasilar atelectasis.

## 2019-10-20 IMAGING — US IR PARACENTESIS
3 series · 3 of 3 positions shown · non-contrast
Comparison: none

INDICATION: Patient with history of hepatocellular carcinoma, recurrent ascites,
recent enterococcal sepsis. Request made for diagnostic and
therapeutic paracentesis.

[Series 1: ir (id) (id)/(id)/(id) ir · 1 of 1 slices shown (1 of 3)]
[im 1/1]
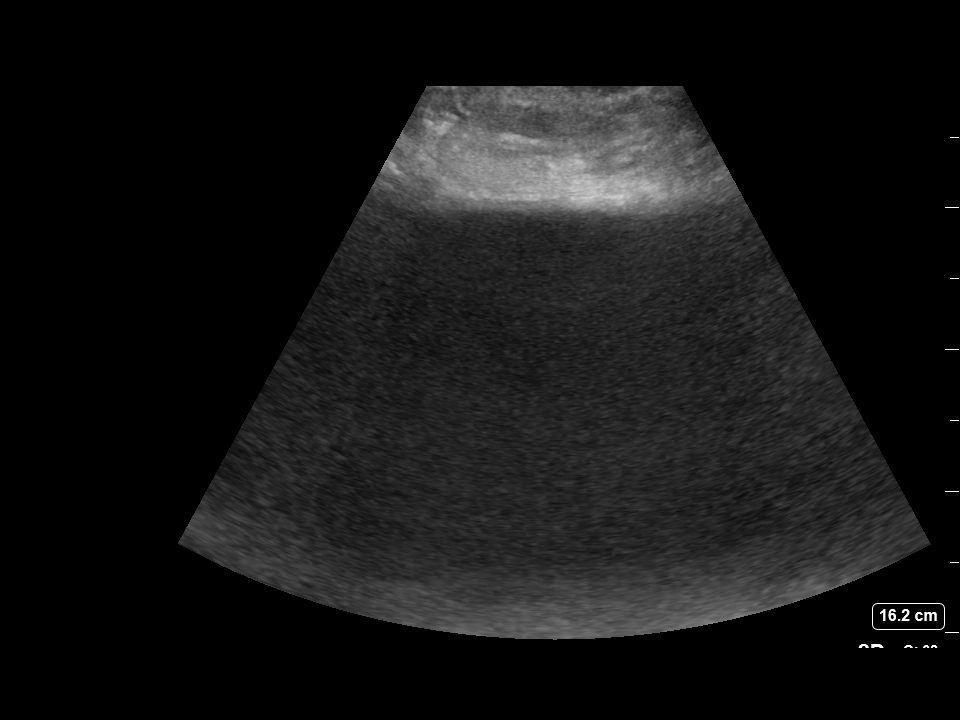

[Series 1: ir (id) (id)/(id)/(id) ir · 1 of 1 slices shown (2 of 3)]
[im 1/1]
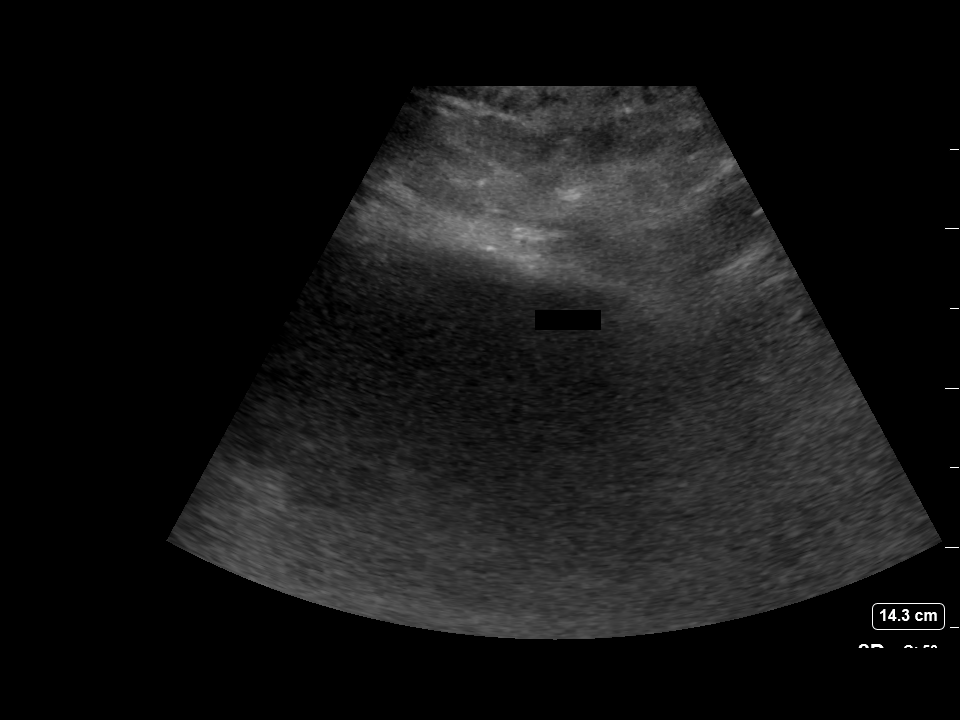

[Series 1: ir (id) (id)/(id)/(id) ir · 1 of 1 slices shown (3 of 3)]
[im 1/1]
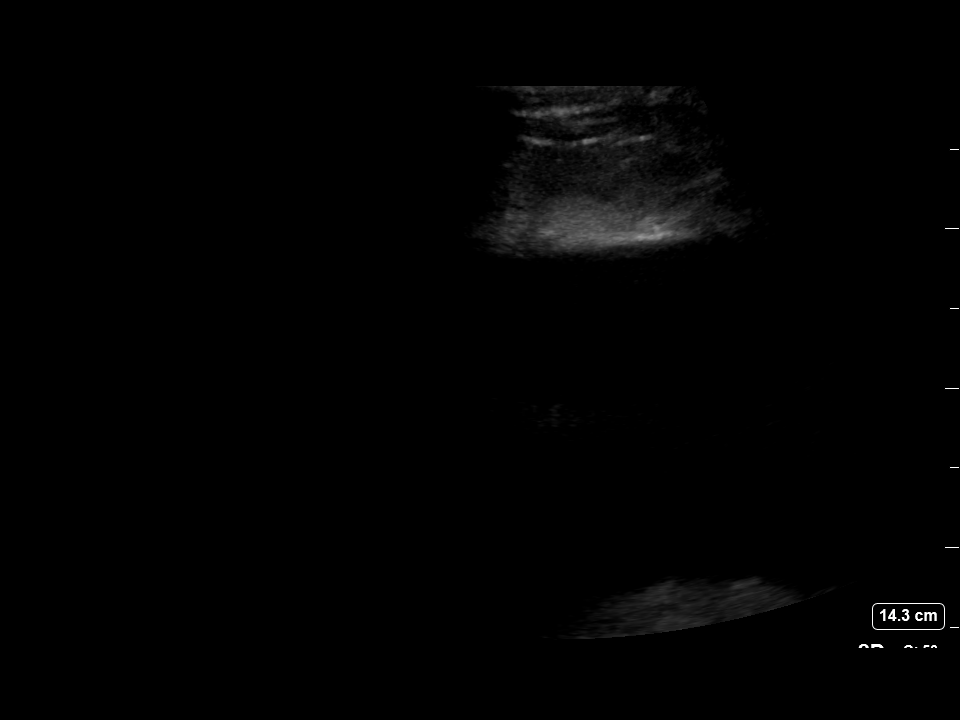

[3 of 3 positions shown; findings below may reference images not displayed]

EXAM:
ULTRASOUND GUIDED DIAGNOSTIC AND THERAPEUTIC PARACENTESIS

MEDICATIONS:
None

COMPLICATIONS:
None immediate.

PROCEDURE:
Informed written consent was obtained from the patient after a
discussion of the risks, benefits and alternatives to treatment. A
timeout was performed prior to the initiation of the procedure.

Initial ultrasound scanning demonstrates a large amount of ascites
within the left mid to lower abdominal quadrant. The left mid to
lower abdomen was prepped and draped in the usual sterile fashion.
1% lidocaine was used for local anesthesia.

Following this, a 19 gauge, 10-cm, Yueh catheter was introduced. An
ultrasound image was saved for documentation purposes. The
paracentesis was performed. The catheter was removed and a dressing
was applied. The patient tolerated the procedure well without
immediate post procedural complication.
FINDINGS: A total of approximately 7.2 liters of hazy, blood-tinged/amber
fluid was removed. Samples were sent to the laboratory as requested
by the clinical team.
IMPRESSION: Successful ultrasound-guided diagnostic and therapeutic paracentesis
yielding 7.2 liters of peritoneal fluid. Patient will tentatively be
scheduled for tunneled peritoneal drain placement within the next
7-12 days (not performed today due to recent history of Enterococcal
sepsis).
